# Patient Record
Sex: Female | Born: 1950
Health system: Southern US, Community
[De-identification: ages and names within clinical notes are randomized; demographics above are authoritative.]

## PROBLEM LIST (undated history)

## (undated) DIAGNOSIS — I10 Essential (primary) hypertension: Secondary | ICD-10-CM

## (undated) DIAGNOSIS — I480 Paroxysmal atrial fibrillation: Secondary | ICD-10-CM

## (undated) DIAGNOSIS — F39 Unspecified mood [affective] disorder: Secondary | ICD-10-CM

## (undated) DIAGNOSIS — Z9151 Personal history of suicidal behavior: Secondary | ICD-10-CM

## (undated) DIAGNOSIS — Z8543 Personal history of malignant neoplasm of ovary: Secondary | ICD-10-CM

## (undated) DIAGNOSIS — J302 Other seasonal allergic rhinitis: Secondary | ICD-10-CM

## (undated) DIAGNOSIS — K589 Irritable bowel syndrome without diarrhea: Secondary | ICD-10-CM

## (undated) DIAGNOSIS — I6522 Occlusion and stenosis of left carotid artery: Secondary | ICD-10-CM

## (undated) DIAGNOSIS — Z86010 Personal history of colonic polyps: Secondary | ICD-10-CM

## (undated) DIAGNOSIS — Z87442 Personal history of urinary calculi: Secondary | ICD-10-CM

## (undated) DIAGNOSIS — N993 Prolapse of vaginal vault after hysterectomy: Secondary | ICD-10-CM

## (undated) DIAGNOSIS — E785 Hyperlipidemia, unspecified: Secondary | ICD-10-CM

## (undated) DIAGNOSIS — M51379 Other intervertebral disc degeneration, lumbosacral region without mention of lumbar back pain or lower extremity pain: Secondary | ICD-10-CM

## (undated) DIAGNOSIS — Z8619 Personal history of other infectious and parasitic diseases: Secondary | ICD-10-CM

## (undated) DIAGNOSIS — C210 Malignant neoplasm of anus, unspecified: Secondary | ICD-10-CM

## (undated) DIAGNOSIS — D649 Anemia, unspecified: Secondary | ICD-10-CM

## (undated) DIAGNOSIS — I779 Disorder of arteries and arterioles, unspecified: Secondary | ICD-10-CM

## (undated) DIAGNOSIS — C569 Malignant neoplasm of unspecified ovary: Secondary | ICD-10-CM

## (undated) DIAGNOSIS — T451X5A Adverse effect of antineoplastic and immunosuppressive drugs, initial encounter: Secondary | ICD-10-CM

## (undated) DIAGNOSIS — N189 Chronic kidney disease, unspecified: Secondary | ICD-10-CM

## (undated) DIAGNOSIS — Z5189 Encounter for other specified aftercare: Secondary | ICD-10-CM

## (undated) DIAGNOSIS — R51 Headache: Secondary | ICD-10-CM

## (undated) DIAGNOSIS — F419 Anxiety disorder, unspecified: Secondary | ICD-10-CM

## (undated) DIAGNOSIS — M5137 Other intervertebral disc degeneration, lumbosacral region: Secondary | ICD-10-CM

## (undated) DIAGNOSIS — R112 Nausea with vomiting, unspecified: Secondary | ICD-10-CM

## (undated) DIAGNOSIS — F329 Major depressive disorder, single episode, unspecified: Secondary | ICD-10-CM

## (undated) DIAGNOSIS — Z8709 Personal history of other diseases of the respiratory system: Secondary | ICD-10-CM

## (undated) DIAGNOSIS — T7840XA Allergy, unspecified, initial encounter: Secondary | ICD-10-CM

## (undated) DIAGNOSIS — J410 Simple chronic bronchitis: Secondary | ICD-10-CM

## (undated) DIAGNOSIS — K219 Gastro-esophageal reflux disease without esophagitis: Secondary | ICD-10-CM

## (undated) DIAGNOSIS — N823 Fistula of vagina to large intestine: Secondary | ICD-10-CM

## (undated) DIAGNOSIS — Z915 Personal history of self-harm: Secondary | ICD-10-CM

## (undated) DIAGNOSIS — K5730 Diverticulosis of large intestine without perforation or abscess without bleeding: Secondary | ICD-10-CM

## (undated) DIAGNOSIS — M1990 Unspecified osteoarthritis, unspecified site: Secondary | ICD-10-CM

## (undated) HISTORY — DX: Personal history of urinary calculi: Z87.442

## (undated) HISTORY — DX: Essential (primary) hypertension: I10

## (undated) HISTORY — DX: Paroxysmal atrial fibrillation: I48.0

## (undated) HISTORY — DX: Irritable bowel syndrome, unspecified: K58.9

## (undated) HISTORY — DX: Chronic kidney disease, unspecified: N18.9

## (undated) HISTORY — PX: COSMETIC SURGERY: SHX468

## (undated) HISTORY — DX: Occlusion and stenosis of left carotid artery: I65.22

## (undated) HISTORY — DX: Nausea with vomiting, unspecified: R11.2

## (undated) HISTORY — DX: Prolapse of vaginal vault after hysterectomy: N99.3

## (undated) HISTORY — DX: Malignant neoplasm of unspecified ovary: C56.9

## (undated) HISTORY — PX: BREAST SURGERY: SHX581

## (undated) HISTORY — DX: Fistula of vagina to large intestine: N82.3

## (undated) HISTORY — DX: Anxiety disorder, unspecified: F41.9

## (undated) HISTORY — DX: Anemia, unspecified: D64.9

## (undated) HISTORY — DX: Adverse effect of antineoplastic and immunosuppressive drugs, initial encounter: T45.1X5A

## (undated) HISTORY — DX: Hyperlipidemia, unspecified: E78.5

## (undated) HISTORY — DX: Allergy, unspecified, initial encounter: T78.40XA

## (undated) HISTORY — DX: Encounter for other specified aftercare: Z51.89

## (undated) HISTORY — PX: TUBAL LIGATION: SHX77

## (undated) HISTORY — DX: Personal history of malignant neoplasm of ovary: Z85.43

## (undated) HISTORY — PX: OTHER SURGICAL HISTORY: SHX169

---

## 1979-09-26 HISTORY — PX: VAGINAL HYSTERECTOMY: SUR661

## 1984-09-25 HISTORY — PX: BREAST ENHANCEMENT SURGERY: SHX7

## 1998-10-07 ENCOUNTER — Encounter: Admission: RE | Admit: 1998-10-07 | Discharge: 1998-10-07 | Payer: Self-pay | Admitting: Family Medicine

## 1999-08-10 ENCOUNTER — Other Ambulatory Visit: Admission: RE | Admit: 1999-08-10 | Discharge: 1999-08-10 | Payer: Self-pay | Admitting: Obstetrics and Gynecology

## 1999-09-02 ENCOUNTER — Encounter: Admission: RE | Admit: 1999-09-02 | Discharge: 1999-09-02 | Payer: Self-pay | Admitting: Obstetrics and Gynecology

## 1999-09-02 ENCOUNTER — Encounter: Payer: Self-pay | Admitting: Obstetrics and Gynecology

## 2000-11-16 ENCOUNTER — Other Ambulatory Visit: Admission: RE | Admit: 2000-11-16 | Discharge: 2000-11-16 | Payer: Self-pay | Admitting: Obstetrics and Gynecology

## 2001-10-30 ENCOUNTER — Other Ambulatory Visit: Admission: RE | Admit: 2001-10-30 | Discharge: 2001-10-30 | Payer: Self-pay | Admitting: Obstetrics and Gynecology

## 2002-03-07 ENCOUNTER — Ambulatory Visit (HOSPITAL_COMMUNITY): Admission: RE | Admit: 2002-03-07 | Discharge: 2002-03-07 | Payer: Self-pay | Admitting: Family Medicine

## 2002-03-07 ENCOUNTER — Encounter: Payer: Self-pay | Admitting: Family Medicine

## 2002-03-24 ENCOUNTER — Ambulatory Visit (HOSPITAL_COMMUNITY): Admission: RE | Admit: 2002-03-24 | Discharge: 2002-03-24 | Payer: Self-pay | Admitting: Family Medicine

## 2002-03-24 ENCOUNTER — Encounter: Payer: Self-pay | Admitting: Family Medicine

## 2002-04-06 ENCOUNTER — Emergency Department (HOSPITAL_COMMUNITY): Admission: EM | Admit: 2002-04-06 | Discharge: 2002-04-06 | Payer: Self-pay | Admitting: *Deleted

## 2002-06-25 ENCOUNTER — Encounter: Payer: Self-pay | Admitting: Obstetrics and Gynecology

## 2002-06-25 ENCOUNTER — Ambulatory Visit (HOSPITAL_COMMUNITY): Admission: RE | Admit: 2002-06-25 | Discharge: 2002-06-25 | Payer: Self-pay | Admitting: Obstetrics and Gynecology

## 2003-08-03 ENCOUNTER — Ambulatory Visit (HOSPITAL_COMMUNITY): Admission: RE | Admit: 2003-08-03 | Discharge: 2003-08-03 | Payer: Self-pay | Admitting: Internal Medicine

## 2004-07-01 ENCOUNTER — Ambulatory Visit (HOSPITAL_COMMUNITY): Admission: RE | Admit: 2004-07-01 | Discharge: 2004-07-01 | Payer: Self-pay | Admitting: Family Medicine

## 2004-07-27 ENCOUNTER — Ambulatory Visit (HOSPITAL_COMMUNITY): Admission: RE | Admit: 2004-07-27 | Discharge: 2004-07-27 | Payer: Self-pay | Admitting: Obstetrics and Gynecology

## 2004-08-03 ENCOUNTER — Ambulatory Visit (HOSPITAL_COMMUNITY): Admission: RE | Admit: 2004-08-03 | Discharge: 2004-08-03 | Payer: Self-pay | Admitting: Obstetrics and Gynecology

## 2004-09-01 ENCOUNTER — Ambulatory Visit (HOSPITAL_COMMUNITY): Admission: RE | Admit: 2004-09-01 | Discharge: 2004-09-01 | Payer: Self-pay | Admitting: Orthopedic Surgery

## 2004-09-22 ENCOUNTER — Ambulatory Visit (HOSPITAL_COMMUNITY): Admission: RE | Admit: 2004-09-22 | Discharge: 2004-09-22 | Payer: Self-pay | Admitting: Orthopedic Surgery

## 2004-09-22 HISTORY — PX: KNEE ARTHROSCOPY: SUR90

## 2004-11-29 ENCOUNTER — Emergency Department (HOSPITAL_COMMUNITY): Admission: EM | Admit: 2004-11-29 | Discharge: 2004-11-29 | Payer: Self-pay | Admitting: Emergency Medicine

## 2005-02-01 ENCOUNTER — Ambulatory Visit (HOSPITAL_COMMUNITY): Admission: RE | Admit: 2005-02-01 | Discharge: 2005-02-01 | Payer: Self-pay | Admitting: Obstetrics and Gynecology

## 2005-10-18 ENCOUNTER — Ambulatory Visit (HOSPITAL_COMMUNITY): Admission: RE | Admit: 2005-10-18 | Discharge: 2005-10-18 | Payer: Self-pay | Admitting: Obstetrics and Gynecology

## 2006-04-10 ENCOUNTER — Ambulatory Visit (HOSPITAL_COMMUNITY): Payer: Self-pay | Admitting: Psychiatry

## 2006-04-12 ENCOUNTER — Ambulatory Visit (HOSPITAL_COMMUNITY): Admission: RE | Admit: 2006-04-12 | Discharge: 2006-04-12 | Payer: Self-pay | Admitting: Family Medicine

## 2006-04-24 ENCOUNTER — Ambulatory Visit (HOSPITAL_COMMUNITY): Payer: Self-pay | Admitting: Psychology

## 2006-05-03 ENCOUNTER — Ambulatory Visit (HOSPITAL_COMMUNITY): Payer: Self-pay | Admitting: Psychology

## 2006-05-08 ENCOUNTER — Ambulatory Visit (HOSPITAL_COMMUNITY): Payer: Self-pay | Admitting: Psychiatry

## 2006-05-22 ENCOUNTER — Ambulatory Visit (HOSPITAL_COMMUNITY): Payer: Self-pay | Admitting: Psychology

## 2006-06-05 ENCOUNTER — Ambulatory Visit (HOSPITAL_COMMUNITY): Payer: Self-pay | Admitting: Psychology

## 2006-06-19 ENCOUNTER — Ambulatory Visit (HOSPITAL_COMMUNITY): Payer: Self-pay | Admitting: Psychology

## 2006-06-28 ENCOUNTER — Ambulatory Visit (HOSPITAL_COMMUNITY): Payer: Self-pay | Admitting: Psychology

## 2006-07-03 ENCOUNTER — Ambulatory Visit (HOSPITAL_COMMUNITY): Payer: Self-pay | Admitting: Psychiatry

## 2006-07-12 ENCOUNTER — Ambulatory Visit (HOSPITAL_COMMUNITY): Payer: Self-pay | Admitting: Psychology

## 2006-07-31 ENCOUNTER — Ambulatory Visit (HOSPITAL_COMMUNITY): Payer: Self-pay | Admitting: Psychiatry

## 2006-08-07 ENCOUNTER — Ambulatory Visit (HOSPITAL_COMMUNITY): Payer: Self-pay | Admitting: Psychology

## 2006-08-28 ENCOUNTER — Ambulatory Visit (HOSPITAL_COMMUNITY): Payer: Self-pay | Admitting: Psychiatry

## 2006-08-30 ENCOUNTER — Ambulatory Visit (HOSPITAL_COMMUNITY): Payer: Self-pay | Admitting: Psychology

## 2006-11-01 ENCOUNTER — Ambulatory Visit (HOSPITAL_COMMUNITY): Payer: Self-pay | Admitting: Psychiatry

## 2006-11-07 ENCOUNTER — Ambulatory Visit: Payer: Self-pay | Admitting: Gastroenterology

## 2006-11-29 ENCOUNTER — Ambulatory Visit (HOSPITAL_COMMUNITY): Payer: Self-pay | Admitting: Psychiatry

## 2006-12-11 ENCOUNTER — Ambulatory Visit (HOSPITAL_COMMUNITY): Admission: RE | Admit: 2006-12-11 | Discharge: 2006-12-11 | Payer: Self-pay | Admitting: Internal Medicine

## 2006-12-11 ENCOUNTER — Ambulatory Visit: Payer: Self-pay | Admitting: Internal Medicine

## 2006-12-25 ENCOUNTER — Ambulatory Visit (HOSPITAL_COMMUNITY): Admission: RE | Admit: 2006-12-25 | Discharge: 2006-12-25 | Payer: Self-pay | Admitting: Obstetrics and Gynecology

## 2007-01-29 ENCOUNTER — Ambulatory Visit (HOSPITAL_COMMUNITY): Payer: Self-pay | Admitting: Psychiatry

## 2007-05-02 ENCOUNTER — Ambulatory Visit (HOSPITAL_COMMUNITY): Payer: Self-pay | Admitting: Psychiatry

## 2007-05-21 ENCOUNTER — Ambulatory Visit (HOSPITAL_COMMUNITY): Payer: Self-pay | Admitting: Psychiatry

## 2007-07-18 ENCOUNTER — Ambulatory Visit (HOSPITAL_COMMUNITY): Payer: Self-pay | Admitting: Psychiatry

## 2007-10-01 ENCOUNTER — Ambulatory Visit (HOSPITAL_COMMUNITY): Payer: Self-pay | Admitting: Psychiatry

## 2007-12-26 ENCOUNTER — Ambulatory Visit (HOSPITAL_COMMUNITY): Payer: Self-pay | Admitting: Psychiatry

## 2008-01-22 ENCOUNTER — Ambulatory Visit (HOSPITAL_COMMUNITY): Admission: RE | Admit: 2008-01-22 | Discharge: 2008-01-22 | Payer: Self-pay | Admitting: Obstetrics and Gynecology

## 2008-03-26 ENCOUNTER — Ambulatory Visit (HOSPITAL_COMMUNITY): Payer: Self-pay | Admitting: Psychiatry

## 2008-06-25 ENCOUNTER — Ambulatory Visit (HOSPITAL_COMMUNITY): Payer: Self-pay | Admitting: Psychiatry

## 2008-09-25 DIAGNOSIS — Z9151 Personal history of suicidal behavior: Secondary | ICD-10-CM

## 2008-09-25 HISTORY — DX: Personal history of suicidal behavior: Z91.51

## 2008-09-29 ENCOUNTER — Ambulatory Visit (HOSPITAL_COMMUNITY): Payer: Self-pay | Admitting: Psychiatry

## 2008-12-24 ENCOUNTER — Ambulatory Visit (HOSPITAL_COMMUNITY): Payer: Self-pay | Admitting: Psychiatry

## 2009-03-23 ENCOUNTER — Ambulatory Visit (HOSPITAL_COMMUNITY): Payer: Self-pay | Admitting: Psychiatry

## 2009-04-14 ENCOUNTER — Ambulatory Visit (HOSPITAL_COMMUNITY): Payer: Self-pay | Admitting: Psychiatry

## 2009-04-28 ENCOUNTER — Ambulatory Visit (HOSPITAL_COMMUNITY): Payer: Self-pay | Admitting: Psychiatry

## 2009-05-12 ENCOUNTER — Ambulatory Visit (HOSPITAL_COMMUNITY): Payer: Self-pay | Admitting: Psychiatry

## 2009-05-30 ENCOUNTER — Inpatient Hospital Stay (HOSPITAL_COMMUNITY): Admission: EM | Admit: 2009-05-30 | Discharge: 2009-06-07 | Payer: Self-pay | Admitting: Emergency Medicine

## 2009-06-07 ENCOUNTER — Ambulatory Visit: Payer: Self-pay | Admitting: Psychiatry

## 2009-06-08 ENCOUNTER — Inpatient Hospital Stay (HOSPITAL_COMMUNITY): Admission: RE | Admit: 2009-06-08 | Discharge: 2009-06-10 | Payer: Self-pay | Admitting: Psychiatry

## 2009-06-14 ENCOUNTER — Ambulatory Visit (HOSPITAL_COMMUNITY): Payer: Self-pay | Admitting: Psychiatry

## 2009-06-29 ENCOUNTER — Ambulatory Visit (HOSPITAL_COMMUNITY): Payer: Self-pay | Admitting: Psychiatry

## 2009-07-06 ENCOUNTER — Ambulatory Visit (HOSPITAL_COMMUNITY): Payer: Self-pay | Admitting: Psychiatry

## 2009-07-09 ENCOUNTER — Ambulatory Visit (HOSPITAL_COMMUNITY): Admission: RE | Admit: 2009-07-09 | Discharge: 2009-07-09 | Payer: Self-pay | Admitting: Family Medicine

## 2009-07-12 ENCOUNTER — Ambulatory Visit (HOSPITAL_COMMUNITY): Payer: Self-pay | Admitting: Psychiatry

## 2009-07-26 ENCOUNTER — Ambulatory Visit (HOSPITAL_COMMUNITY): Payer: Self-pay | Admitting: Psychiatry

## 2009-08-03 ENCOUNTER — Ambulatory Visit (HOSPITAL_COMMUNITY): Payer: Self-pay | Admitting: Psychiatry

## 2009-08-10 ENCOUNTER — Ambulatory Visit (HOSPITAL_COMMUNITY): Payer: Self-pay | Admitting: Psychiatry

## 2009-09-14 ENCOUNTER — Ambulatory Visit (HOSPITAL_COMMUNITY): Payer: Self-pay | Admitting: Psychiatry

## 2009-11-11 ENCOUNTER — Ambulatory Visit (HOSPITAL_COMMUNITY): Payer: Self-pay | Admitting: Psychiatry

## 2010-02-08 ENCOUNTER — Ambulatory Visit (HOSPITAL_COMMUNITY): Payer: Self-pay | Admitting: Psychiatry

## 2010-05-17 ENCOUNTER — Ambulatory Visit (HOSPITAL_COMMUNITY): Payer: Self-pay | Admitting: Psychiatry

## 2010-05-26 ENCOUNTER — Ambulatory Visit (HOSPITAL_COMMUNITY): Payer: Self-pay | Admitting: Psychiatry

## 2010-06-02 ENCOUNTER — Ambulatory Visit (HOSPITAL_COMMUNITY): Payer: Self-pay | Admitting: Psychiatry

## 2010-06-09 ENCOUNTER — Ambulatory Visit (HOSPITAL_COMMUNITY): Payer: Self-pay | Admitting: Psychiatry

## 2010-06-23 ENCOUNTER — Ambulatory Visit (HOSPITAL_COMMUNITY): Payer: Self-pay | Admitting: Psychiatry

## 2010-07-07 ENCOUNTER — Ambulatory Visit (HOSPITAL_COMMUNITY): Payer: Self-pay | Admitting: Psychiatry

## 2010-07-14 ENCOUNTER — Ambulatory Visit (HOSPITAL_COMMUNITY): Payer: Self-pay | Admitting: Psychiatry

## 2010-07-21 ENCOUNTER — Ambulatory Visit (HOSPITAL_COMMUNITY): Payer: Self-pay | Admitting: Psychiatry

## 2010-08-04 ENCOUNTER — Ambulatory Visit (HOSPITAL_COMMUNITY): Payer: Self-pay | Admitting: Psychiatry

## 2010-08-16 ENCOUNTER — Ambulatory Visit (HOSPITAL_COMMUNITY): Admission: RE | Admit: 2010-08-16 | Discharge: 2010-08-16 | Payer: Self-pay | Admitting: Obstetrics and Gynecology

## 2010-08-25 ENCOUNTER — Ambulatory Visit (HOSPITAL_COMMUNITY): Payer: Self-pay | Admitting: Psychiatry

## 2010-09-09 ENCOUNTER — Ambulatory Visit (HOSPITAL_COMMUNITY): Payer: Self-pay | Admitting: Psychiatry

## 2010-10-13 ENCOUNTER — Ambulatory Visit (HOSPITAL_COMMUNITY): Admit: 2010-10-13 | Payer: Self-pay | Admitting: Psychiatry

## 2010-10-15 ENCOUNTER — Encounter: Payer: Self-pay | Admitting: Obstetrics and Gynecology

## 2010-10-16 ENCOUNTER — Encounter: Payer: Self-pay | Admitting: Obstetrics and Gynecology

## 2010-10-17 ENCOUNTER — Encounter: Payer: Self-pay | Admitting: Obstetrics & Gynecology

## 2010-10-20 ENCOUNTER — Ambulatory Visit (HOSPITAL_COMMUNITY)
Admission: RE | Admit: 2010-10-20 | Discharge: 2010-10-20 | Payer: Self-pay | Source: Home / Self Care | Attending: Psychiatry | Admitting: Psychiatry

## 2010-10-25 ENCOUNTER — Ambulatory Visit (HOSPITAL_COMMUNITY)
Admission: RE | Admit: 2010-10-25 | Discharge: 2010-10-25 | Payer: Self-pay | Source: Home / Self Care | Attending: Psychiatry | Admitting: Psychiatry

## 2010-11-03 ENCOUNTER — Encounter (HOSPITAL_COMMUNITY): Payer: Self-pay | Admitting: Psychiatry

## 2010-11-18 ENCOUNTER — Encounter (INDEPENDENT_AMBULATORY_CARE_PROVIDER_SITE_OTHER): Payer: 59 | Admitting: Psychiatry

## 2010-11-18 DIAGNOSIS — F39 Unspecified mood [affective] disorder: Secondary | ICD-10-CM

## 2010-12-01 ENCOUNTER — Encounter (HOSPITAL_COMMUNITY): Payer: 59 | Admitting: Psychiatry

## 2010-12-30 LAB — D-DIMER, QUANTITATIVE: D-Dimer, Quant: 0.43 ug/mL-FEU (ref 0.00–0.48)

## 2010-12-30 LAB — CBC
HCT: 31.2 % — ABNORMAL LOW (ref 36.0–46.0)
HCT: 32.7 % — ABNORMAL LOW (ref 36.0–46.0)
HCT: 33 % — ABNORMAL LOW (ref 36.0–46.0)
HCT: 34.1 % — ABNORMAL LOW (ref 36.0–46.0)
Hemoglobin: 10.8 g/dL — ABNORMAL LOW (ref 12.0–15.0)
Hemoglobin: 11.4 g/dL — ABNORMAL LOW (ref 12.0–15.0)
Hemoglobin: 12 g/dL (ref 12.0–15.0)
MCHC: 34.8 g/dL (ref 30.0–36.0)
MCHC: 35.7 g/dL (ref 30.0–36.0)
MCV: 97.8 fL (ref 78.0–100.0)
MCV: 97.9 fL (ref 78.0–100.0)
MCV: 98.2 fL (ref 78.0–100.0)
Platelets: 164 10*3/uL (ref 150–400)
Platelets: 178 10*3/uL (ref 150–400)
Platelets: 184 10*3/uL (ref 150–400)
Platelets: 194 10*3/uL (ref 150–400)
Platelets: 211 10*3/uL (ref 150–400)
Platelets: 512 10*3/uL — ABNORMAL HIGH (ref 150–400)
RBC: 3.35 MIL/uL — ABNORMAL LOW (ref 3.87–5.11)
RBC: 3.79 MIL/uL — ABNORMAL LOW (ref 3.87–5.11)
RDW: 13.4 % (ref 11.5–15.5)
RDW: 13.5 % (ref 11.5–15.5)
RDW: 13.6 % (ref 11.5–15.5)
RDW: 14.1 % (ref 11.5–15.5)
WBC: 11 10*3/uL — ABNORMAL HIGH (ref 4.0–10.5)
WBC: 5.7 10*3/uL (ref 4.0–10.5)
WBC: 8.4 10*3/uL (ref 4.0–10.5)

## 2010-12-30 LAB — BASIC METABOLIC PANEL
BUN: 17 mg/dL (ref 6–23)
BUN: 3 mg/dL — ABNORMAL LOW (ref 6–23)
BUN: 3 mg/dL — ABNORMAL LOW (ref 6–23)
BUN: 5 mg/dL — ABNORMAL LOW (ref 6–23)
BUN: 6 mg/dL (ref 6–23)
BUN: 7 mg/dL (ref 6–23)
BUN: 3 mg/dL — ABNORMAL LOW (ref 6–23)
CO2: 21 mEq/L (ref 19–32)
CO2: 26 mEq/L (ref 19–32)
CO2: 26 mEq/L (ref 19–32)
Calcium: 8.2 mg/dL — ABNORMAL LOW (ref 8.4–10.5)
Calcium: 8.3 mg/dL — ABNORMAL LOW (ref 8.4–10.5)
Calcium: 8.5 mg/dL (ref 8.4–10.5)
Calcium: 8.9 mg/dL (ref 8.4–10.5)
Chloride: 106 mEq/L (ref 96–112)
Chloride: 105 mEq/L (ref 96–112)
Creatinine, Ser: 0.56 mg/dL (ref 0.4–1.2)
Creatinine, Ser: 0.89 mg/dL (ref 0.4–1.2)
Creatinine, Ser: 0.61 mg/dL (ref 0.4–1.2)
GFR calc Af Amer: >60 mL/min (ref >60)
GFR calc Af Amer: >60 mL/min (ref >60)
GFR calc Af Amer: >60 mL/min (ref >60)
GFR calc non Af Amer: >60 mL/min (ref >60)
GFR calc non Af Amer: >60 mL/min (ref >60)
GFR calc non Af Amer: >60 mL/min (ref >60)
GFR calc non Af Amer: >60 mL/min (ref >60)
Glucose, Bld: 115 mg/dL — ABNORMAL HIGH (ref 70–99)
Glucose, Bld: 119 mg/dL — ABNORMAL HIGH (ref 70–99)
Glucose, Bld: 137 mg/dL — ABNORMAL HIGH (ref 70–99)
Potassium: 3.4 mEq/L — ABNORMAL LOW (ref 3.5–5.1)
Potassium: 3.7 mEq/L (ref 3.5–5.1)
Sodium: 140 mEq/L (ref 135–145)
Sodium: 141 mEq/L (ref 135–145)

## 2010-12-30 LAB — DIFFERENTIAL
Basophils Absolute: 0 10*3/uL (ref 0.0–0.1)
Basophils Absolute: 0 10*3/uL (ref 0.0–0.1)
Basophils Absolute: 0 10*3/uL (ref 0.0–0.1)
Basophils Relative: 0 % (ref 0–1)
Eosinophils Absolute: 0 10*3/uL (ref 0.0–0.7)
Eosinophils Absolute: 0.1 10*3/uL (ref 0.0–0.7)
Eosinophils Absolute: 0.1 10*3/uL (ref 0.0–0.7)
Eosinophils Relative: 1 % (ref 0–5)
Eosinophils Relative: 1 % (ref 0–5)
Eosinophils Relative: 1 % (ref 0–5)
Eosinophils Relative: 2 % (ref 0–5)
Eosinophils Relative: 2 % (ref 0–5)
Eosinophils Relative: 6 % — ABNORMAL HIGH (ref 0–5)
Lymphocytes Relative: 11 % — ABNORMAL LOW (ref 12–46)
Lymphocytes Relative: 15 % (ref 12–46)
Lymphocytes Relative: 16 % (ref 12–46)
Lymphocytes Relative: 7 % — ABNORMAL LOW (ref 12–46)
Lymphs Abs: 0.6 10*3/uL — ABNORMAL LOW (ref 0.7–4.0)
Lymphs Abs: 0.8 10*3/uL (ref 0.7–4.0)
Lymphs Abs: 0.9 10*3/uL (ref 0.7–4.0)
Lymphs Abs: 1.3 10*3/uL (ref 0.7–4.0)
Lymphs Abs: 1.9 10*3/uL (ref 0.7–4.0)
Monocytes Absolute: 0.1 10*3/uL (ref 0.1–1.0)
Monocytes Absolute: 0.4 10*3/uL (ref 0.1–1.0)
Monocytes Absolute: 0.4 10*3/uL (ref 0.1–1.0)
Monocytes Relative: 3 % (ref 3–12)
Monocytes Relative: 5 % (ref 3–12)
Neutro Abs: 6.2 10*3/uL (ref 1.7–7.7)
Neutro Abs: 9.9 10*3/uL — ABNORMAL HIGH (ref 1.7–7.7)
Neutrophils Relative %: 91 % — ABNORMAL HIGH (ref 43–77)

## 2010-12-30 LAB — BLOOD GAS, ARTERIAL
Acid-Base Excess: 0.1 mmol/L (ref 0.0–2.0)
Acid-base deficit: 5.2 mmol/L — ABNORMAL HIGH (ref 0.0–2.0)
Bicarbonate: 23.7 mEq/L (ref 20.0–24.0)
Bicarbonate: 24.1 mEq/L — ABNORMAL HIGH (ref 20.0–24.0)
FIO2: 0.4 %
MECHVT: 550 mL
MECHVT: 550 mL
O2 Content: 2 L/min
O2 Saturation: 97.4 %
O2 Saturation: 99.1 %
PEEP: 5 cmH2O
PEEP: 5 cmH2O
Patient temperature: 37
RATE: 14 resp/min
RATE: 16 resp/min
TCO2: 21.8 mmol/L (ref 0–100)
pCO2 arterial: 34.5 mmHg — ABNORMAL LOW (ref 35.0–45.0)
pCO2 arterial: 38.4 mmHg (ref 35.0–45.0)
pCO2 arterial: 51.4 mmHg — ABNORMAL HIGH (ref 35.0–45.0)
pH, Arterial: 7.277 — ABNORMAL LOW (ref 7.350–7.400)
pO2, Arterial: 81.8 mmHg (ref 80.0–100.0)

## 2010-12-30 LAB — CARDIAC PANEL(CRET KIN+CKTOT+MB+TROPI)
CK, MB: 1.8 ng/mL (ref 0.3–4.0)
Total CK: 143 U/L (ref 7–177)
Troponin I: 0.01 ng/mL (ref 0.00–0.06)

## 2010-12-30 LAB — COMPREHENSIVE METABOLIC PANEL
ALT: 26 U/L (ref 0–35)
ALT: 33 U/L (ref 0–35)
AST: 28 U/L (ref 0–37)
AST: 36 U/L (ref 0–37)
Albumin: 2.9 g/dL — ABNORMAL LOW (ref 3.5–5.2)
CO2: 31 mEq/L (ref 19–32)
Calcium: 8.2 mg/dL — ABNORMAL LOW (ref 8.4–10.5)
Chloride: 103 mEq/L (ref 96–112)
Creatinine, Ser: 0.54 mg/dL (ref 0.4–1.2)
GFR calc Af Amer: 52 mL/min — ABNORMAL LOW (ref >60)
GFR calc Af Amer: >60 mL/min (ref >60)
GFR calc non Af Amer: 43 mL/min — ABNORMAL LOW (ref >60)
GFR calc non Af Amer: >60 mL/min (ref >60)
Potassium: 3.3 mEq/L — ABNORMAL LOW (ref 3.5–5.1)
Sodium: 139 mEq/L (ref 135–145)
Sodium: 142 mEq/L (ref 135–145)
Total Bilirubin: 0.3 mg/dL (ref 0.3–1.2)
Total Protein: 5.3 g/dL — ABNORMAL LOW (ref 6.0–8.3)

## 2010-12-30 LAB — RAPID URINE DRUG SCREEN, HOSP PERFORMED
Barbiturates: NOT DETECTED
Cocaine: NOT DETECTED
Opiates: NOT DETECTED

## 2010-12-30 LAB — ACETAMINOPHEN LEVEL: Acetaminophen (Tylenol), Serum: <10 ug/mL — ABNORMAL LOW (ref 10–30)

## 2010-12-30 LAB — URINALYSIS, ROUTINE W REFLEX MICROSCOPIC
Glucose, UA: NEGATIVE mg/dL
pH: 5.5 (ref 5.0–8.0)

## 2010-12-30 LAB — APTT: aPTT: 25 seconds (ref 24–37)

## 2010-12-30 LAB — PROTIME-INR: INR: 1 (ref 0.00–1.49)

## 2010-12-30 LAB — CULTURE, BLOOD (ROUTINE X 2)

## 2010-12-30 LAB — TSH: TSH: 2.523 u[IU]/mL (ref 0.350–4.500)

## 2010-12-30 LAB — CULTURE, RESPIRATORY W GRAM STAIN

## 2010-12-30 LAB — URINE CULTURE: Culture: NO GROWTH

## 2010-12-30 LAB — SALICYLATE LEVEL: Salicylate Lvl: <4 mg/dL (ref 2.8–20.0)

## 2010-12-30 LAB — EXPECTORATED SPUTUM ASSESSMENT W GRAM STAIN, RFLX TO RESP C

## 2010-12-30 LAB — MAGNESIUM: Magnesium: 1.8 mg/dL (ref 1.5–2.5)

## 2011-01-19 ENCOUNTER — Encounter (INDEPENDENT_AMBULATORY_CARE_PROVIDER_SITE_OTHER): Payer: 59 | Admitting: Psychiatry

## 2011-01-19 DIAGNOSIS — F331 Major depressive disorder, recurrent, moderate: Secondary | ICD-10-CM

## 2011-02-10 NOTE — H&P (Signed)
NAME:  KENYATTA, GLOECKNER                  ACCOUNT NO.:  [id number]   MEDICAL RECORD NO.:  [id number]          PATIENT TYPE:  AMB   LOCATION:  DAY                           FACILITY:  [facility]   PHYSICIAN:  Kassie Mends, M.D.      DATE OF BIRTH:  Feb 19, 1951   DATE OF ADMISSION:  DATE OF DISCHARGE:  LH                              HISTORY & PHYSICAL   CHIEF COMPLAINT:  Recent diarrhea.  Need colonoscopy.   PRIMARY CARE PHYSICIAN:  Dr. Patrica Duel   PHYSICIAN COSIGNING NOTE:  Dr. Kassie Mends for Dr. Lionel December in his  absence.   HISTORY OF PRESENT ILLNESS:  Zarinah is a 60 year old Caucasian female who  presents in order to schedule a colonoscopy.  She was going to schedule  a screening colonoscopy several months ago but she actually had  postponed it.  At the time she was having some issues with constipation  and bleeding hemorrhoids.  Most recently, however, stools have been on  the loose side.  She had diarrhea for about two weeks. She felt she  might have eaten some contaminated food.  She denied any blood  associated with the diarrhea.  She has some irritation of her  hemorrhoids intermittently, uses Preparation H cream and wipes.  She  complains of frequent rectal itching.  Denies any recent hematochezia or  melena.  She has had some lower abdominal cramping which has improved  since her stools have become more normal.  She denies any nausea,  vomiting or chronic GERD.  Her weight has been stable.   CURRENT MEDICATIONS:  Paxil 12.5 mg 2 daily.  Lorazepam 1 mg t.i.d.  Estradiol 2 mg daily.   ALLERGIES:  CODEINE.   PAST MEDICAL HISTORY:  Depression, hormone replacement therapy.   PAST SURGICAL HISTORY:  She has had a partial hysterectomy, previous  tubal ligation and Cesarean section.  She had left knee arthroscopy and  breast augmentation.   FAMILY HISTORY:  Negative for colorectal cancer.  Her sister has had  diverticulitis and colorectal polyps.   SOCIAL HISTORY:  She is  married and has three children.  She is retired.  She smokes a pack of cigarettes daily.  Consumes a glass of wine every  day.   REVIEW OF SYSTEMS:  See HPI for GI and constitutional.  Cardiopulmonary:  Denies any chest pain or shortness of breath.   PHYSICAL EXAMINATION:  Weight 162.  Height 5 foot 6.  Temp 98.6.  Blood  pressure 120/82.  Pulse 88.  GENERAL:  Pleasant, well-nourished, well-developed, Caucasian female in  no acute distress.  SKIN:  Warm and dry, no jaundice.  HEENT:  Sclerae nonicteric.  Oropharyngeal mucosa moist and pink.  No  lesions, erythema or exudate.  No lymphadenopathy, thyromegaly.  CHEST:  Lungs are clear to auscultation.  CARDIAC EXAM:  Reveals regular rate and rhythm.  Normal S1, S2.  No  murmurs, rubs or gallops.  ABDOMEN:  Positive bowel sounds.  Abdomen is soft, nontender,  nondistended.  No organomegaly or masses.  No rebound tenderness or  guarding.  No  abdominal bruits or hernias.  EXTREMITIES: No edema.   IMPRESSION:  Senaida is a 60 year old lady who has had some hematochezia  in the past but none recently.  Recently she has had some diarrhea which  is basically resolving at this point.  She has problems with hemorrhoids  with rectal itching.  She has never had a colonoscopy.  Recommend one at  this point.  I discussed risks, alternatives and benefits with the  patient and she is agreeable to proceed.   PLAN:  Colonoscopy in the near future with Dr. Karilyn Cota.  Please note, Dr.  Cira Servant is co-signer in his absence.      Tana Coast, P.A.      Kassie Mends, M.D.  Electronically Signed    LL/MEDQ  D:  11/07/2006  T:  11/07/2006  Job:  [id number]   cc:   Patrica Duel, M.D.  Fax: [phone]

## 2011-02-10 NOTE — Op Note (Signed)
NAME:  Abigail Wagner, Abigail Wagner                  ACCOUNT NO.:  [id number]   MEDICAL RECORD NO.:  [id number]          PATIENT TYPE:  AMB   LOCATION:  DAY                          FACILITY:  [facility]   PHYSICIAN:  Marlowe Kays, M.D.  DATE OF BIRTH:  [birth year]   DATE OF PROCEDURE:  09/22/2004  DATE OF DISCHARGE:                                 OPERATIVE REPORT   PREOPERATIVE DIAGNOSES:  1.  Torn medial meniscus.  2.  Chondromalacia of medial facet of patella, left knee.   POSTOPERATIVE DIAGNOSES:  1.  Torn medial meniscus.  2.  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   OPERATION:  Left knee arthroscopy with:  1.  Partial medial meniscectomy.  2.  Shaving of medial femoral condyle and patella.   SURGEON:  Marlowe Kays, M.D.   ASSISTANT:  Nurse.   ANESTHESIA:  General.   PATHOLOGY AND JUSTIFICATION FOR PROCEDURE:  She has had a long history of  pain and something slipping in her left knee.  It became much worse the end  of November.  I saw her on  August 25, 2004, leading to an MRI showing a  badly torn medial meniscus.  With these findings, she is here today for the  above-mentioned surgery.  See below for additional details of pathology.   PROCEDURE:  Under satisfactory general anesthesia and pneumatic tourniquet,  leg was esmarched out non-sterilely, thigh stabilizer.  Left knee was  prepped with DuraPrep and draped into a sterile field, Ace wrap and knee  protector for the right knee.  Superior and medial saline inflow.  First  through an anterolateral portal, the medial compartment of the knee joint  was evaluated.  She had a little bit of synovitis anteriorly, which I  resected.  She had a very extensive tear with large flaps involving the  entire posterior third of the medial meniscus, which I resected back to a  stable rim with a combination of small arthroscopic scissors, straight and  angled upbiting baskets and a 3.5  shaver.  Final pictures were taken.  Looking out in the medial gutter and suprapatellar area, her patella was  quite worn, particularly at the medial facet.  There was some fissuring at  the apex; all this was smoothed down as much as possible.  We then reversed  portals.  Laterally, her knee looked normal; a representative picture was  taken.  I also looked up in the lateral gutter and patella once again and  shaved a little additional off the patella.  The knee joint was then  irrigated until clear and all fluid possible removed.  The 2 anterior  portals were closed with 4-0 nylon.  Twenty milliliters of 0.5% Marcaine  with adrenaline,but no morphine since she has a morphine allergy, were then  instilled through the inflow apparatus, which was removed, and this portal  closed with 4-0 nylon as well.  Betadine, Adaptic and dry sterile dressing  were applied.  Tourniquet was released.  She tolerated the procedure well  and  was taken to the recovery room in satisfactory condition with no known  complications.      JA/MEDQ  D:  09/22/2004  T:  09/22/2004  Job:  [id number]

## 2011-02-10 NOTE — Op Note (Signed)
Abigail Wagner, Abigail Wagner                  ACCOUNT NO.:  [id number]   MEDICAL RECORD NO.:  [id number]          PATIENT TYPE:  AMB   LOCATION:  DAY                           FACILITY:  [facility]   PHYSICIAN:  Lionel December, M.D.    DATE OF BIRTH:  January 19, 1951   DATE OF PROCEDURE:  12/10/2006  DATE OF DISCHARGE:                               OPERATIVE REPORT   PROCEDURE:  Colonoscopy.   INDICATIONS:  Abigail Wagner is a 60 year old Caucasian female with a recent bout  of diarrhea and hematochezia.  She states that her diarrhea has  resolved.  She is undergoing colonoscopy both for diagnostic and  screening purposes.  Family history is negative for colorectal  carcinoma.   The procedure risks were reviewed the patient, informed consent was  obtained.   MEDICATIONS FOR CONSCIOUS SEDATION:  Demerol 50 mg IV, Versed 10 mg IV  in divided dose.   FINDINGS:  Procedure performed in endoscopy suite.  The patient's vital  signs and O2 saturation were monitored during procedure and remained  stable.  The patient was placed in the left lateral position and rectal  examination performed.  No abnormality noted external or digital exam.  The Pentax video scope was placed in the rectum and advanced under  vision into sigmoid colon, where there was patchy edema and two small  erosions.  Preparation was satisfactory except she had some stool  coating mucosa at the ascending colon and cecum.  This area to be washed  to see the landmarks.  The scope was passed into cecum.  The colon was  somewhat redundant.  Cecum was identified by appendiceal orifice.  The  ileocecal valve was inconspicuous.  Pictures taken for the record.  As  the scope was withdrawn, colonic mucosa was once again carefully  examined and was otherwise normal.  Changes in the sigmoid colon were  once again recognized.  It was felt to be resolving colitis and  therefore a biopsy was not obtained.  Rectal mucosa was normal.  The  scope was retroflexed to  examine anorectal junction and small  hemorrhoids noted below the dentate line.  Endoscope was straightened  and withdrawn.  The patient tolerated the procedure well.   FINAL DIAGNOSES:  1. Focal edema at sigmoid colon with two erosions, suspect resolving      colitis, possibly nonspecific or infectious.  2  No evidence of a neoplasm or polyps.  1. External hemorrhoids.   RECOMMENDATIONS:  High-fiber diet plus fiber supplement 3-4 grams daily.   If need be, she can take Colace 1-2 tablets daily.   If symptoms relapse, she will give [place] a call.      Lionel December, M.D.  Electronically Signed     NR/MEDQ  D:  12/11/2006  T:  12/11/2006  Job:  [id number]   cc:   Patrica Duel, M.D.  Fax: [phone]

## 2011-04-13 ENCOUNTER — Encounter (HOSPITAL_COMMUNITY): Payer: 59 | Admitting: Psychiatry

## 2011-04-18 ENCOUNTER — Encounter (INDEPENDENT_AMBULATORY_CARE_PROVIDER_SITE_OTHER): Payer: 59 | Admitting: Psychiatry

## 2011-04-18 DIAGNOSIS — F331 Major depressive disorder, recurrent, moderate: Secondary | ICD-10-CM

## 2011-07-11 ENCOUNTER — Encounter (INDEPENDENT_AMBULATORY_CARE_PROVIDER_SITE_OTHER): Payer: 59 | Admitting: Psychiatry

## 2011-07-11 DIAGNOSIS — F331 Major depressive disorder, recurrent, moderate: Secondary | ICD-10-CM

## 2011-07-24 ENCOUNTER — Encounter (HOSPITAL_COMMUNITY): Payer: 59 | Admitting: Psychiatry

## 2011-08-01 ENCOUNTER — Ambulatory Visit (INDEPENDENT_AMBULATORY_CARE_PROVIDER_SITE_OTHER): Payer: 59 | Admitting: Psychiatry

## 2011-08-01 ENCOUNTER — Encounter (HOSPITAL_COMMUNITY): Payer: Self-pay | Admitting: Psychiatry

## 2011-08-01 DIAGNOSIS — F331 Major depressive disorder, recurrent, moderate: Secondary | ICD-10-CM

## 2011-08-01 MED ORDER — DIVALPROEX SODIUM 250 MG PO DR TAB: 250.0000 mg | DELAYED_RELEASE_TABLET | ORAL | Status: DC

## 2011-08-01 NOTE — Progress Notes (Signed)
Patient came today for her followup appointment we have started Depakote on her last visit she has been taking regularly. She's doing much better with his new medication her sleep has improved she is not sleeping 5-6 hours and problem. She still concerned about her son who is in hospital but made discharge today. She is concern about her grandson but hoping court case will be resolved soon. Recently her anxiety and depression is much better she does not have that much of racing thoughts agitation or anger issue. She reported was her affects of medication. Patient is pleasant cooperative and maintained good eye contact her speech is soft clear and coherent her thought process is logical and goal directed she denied any active or passive suicidal thoughts. There were no psychotic symptoms present at this time. There were no abnormal movements noted. Attention and concentration were improved from the past she's alert and oriented x3 her insight judgment and impulse control were okay. Assessment bipolar disorder rule out major depressive disorder. Plan I will continue Depakote 250 mg at bedtime she will also take Lexapro 20 mg daily a new prescription for Depakote has been given with additional refill. I will see her again in 2 months. Time spent 20 minutes.

## 2011-09-01 ENCOUNTER — Other Ambulatory Visit: Payer: Self-pay | Admitting: Obstetrics and Gynecology

## 2011-09-01 DIAGNOSIS — Z139 Encounter for screening, unspecified: Secondary | ICD-10-CM

## 2011-09-02 ENCOUNTER — Other Ambulatory Visit (HOSPITAL_COMMUNITY): Payer: Self-pay

## 2011-09-05 ENCOUNTER — Ambulatory Visit (INDEPENDENT_AMBULATORY_CARE_PROVIDER_SITE_OTHER): Payer: 59 | Admitting: Psychiatry

## 2011-09-05 ENCOUNTER — Ambulatory Visit (HOSPITAL_COMMUNITY): Payer: 59

## 2011-09-05 ENCOUNTER — Ambulatory Visit (HOSPITAL_COMMUNITY)
Admission: RE | Admit: 2011-09-05 | Discharge: 2011-09-05 | Disposition: A | Discharge status: Home / Self Care | Payer: 59 | Source: Ambulatory Visit | Attending: Obstetrics and Gynecology | Admitting: Obstetrics and Gynecology

## 2011-09-05 ENCOUNTER — Encounter (HOSPITAL_COMMUNITY): Payer: Self-pay | Admitting: Psychiatry

## 2011-09-05 VITALS — Wt 169.4 lb

## 2011-09-05 DIAGNOSIS — F39 Unspecified mood [affective] disorder: Secondary | ICD-10-CM

## 2011-09-05 DIAGNOSIS — Z1231 Encounter for screening mammogram for malignant neoplasm of breast: Secondary | ICD-10-CM | POA: Insufficient documentation

## 2011-09-05 DIAGNOSIS — F331 Major depressive disorder, recurrent, moderate: Secondary | ICD-10-CM

## 2011-09-05 DIAGNOSIS — Z139 Encounter for screening, unspecified: Secondary | ICD-10-CM

## 2011-09-05 DIAGNOSIS — F329 Major depressive disorder, single episode, unspecified: Secondary | ICD-10-CM

## 2011-09-05 MED ORDER — DIVALPROEX SODIUM 250 MG PO DR TAB: 250.0000 mg | DELAYED_RELEASE_TABLET | ORAL | Status: DC

## 2011-09-05 MED ORDER — ESCITALOPRAM OXALATE 20 MG PO TABS: 20.0000 mg | ORAL_TABLET | Freq: Every day | ORAL | Status: DC

## 2011-09-05 NOTE — Progress Notes (Signed)
Patient came today for her followup appointment she is now taking Depakote regularly. She's doing much better  her sleep has improved. Court case about her grand son is now resolved. Her anxiety and depression is much better. she does not have that much of racing thoughts agitation or anger issue. She was to continue her current medication. She had a good Thanksgiving and she is looking forward to have a good Christmas. She is not drinking anymore.  Mental status examination Patient is pleasant cooperative and maintained good eye contact her speech is soft clear and coherent her thought process is logical and goal directed she denied any active or passive suicidal thoughts. There were no psychotic symptoms present at this time. There were no abnormal movements noted. Attention and concentration were improved from the past she's alert and oriented x3 her insight judgment and impulse control were okay.  Assessment bipolar disorder rule out major depressive disorder.  Plan I will continue Depakote 250 mg at bedtime and Lexapro 20 mg daily. I explained risks and benefits of medication in detail. I recommended to call if she has any question or concern about the medication. She is not interested for individual psychotherapy. A new prescription of Depakote and Lexapro has been given. I will see her again in 3 months.

## 2011-10-30 ENCOUNTER — Other Ambulatory Visit (HOSPITAL_COMMUNITY): Payer: Self-pay | Admitting: *Deleted

## 2011-10-30 DIAGNOSIS — F329 Major depressive disorder, single episode, unspecified: Secondary | ICD-10-CM

## 2011-10-31 ENCOUNTER — Other Ambulatory Visit (HOSPITAL_COMMUNITY): Payer: Self-pay | Admitting: Psychiatry

## 2011-10-31 MED ORDER — ESCITALOPRAM OXALATE 20 MG PO TABS: 20.0000 mg | ORAL_TABLET | Freq: Every day | ORAL | Status: DC

## 2011-11-07 ENCOUNTER — Ambulatory Visit (INDEPENDENT_AMBULATORY_CARE_PROVIDER_SITE_OTHER): Payer: 59 | Admitting: Psychiatry

## 2011-11-07 ENCOUNTER — Encounter (HOSPITAL_COMMUNITY): Payer: Self-pay | Admitting: Psychiatry

## 2011-11-07 DIAGNOSIS — F32A Depression, unspecified: Secondary | ICD-10-CM | POA: Insufficient documentation

## 2011-11-07 DIAGNOSIS — F3289 Other specified depressive episodes: Secondary | ICD-10-CM

## 2011-11-07 DIAGNOSIS — F329 Major depressive disorder, single episode, unspecified: Secondary | ICD-10-CM

## 2011-11-07 DIAGNOSIS — F39 Unspecified mood [affective] disorder: Secondary | ICD-10-CM

## 2011-11-07 DIAGNOSIS — F331 Major depressive disorder, recurrent, moderate: Secondary | ICD-10-CM

## 2011-11-07 MED ORDER — DIVALPROEX SODIUM 250 MG PO DR TAB: 250.0000 mg | DELAYED_RELEASE_TABLET | ORAL | Status: DC

## 2011-11-07 MED ORDER — ESCITALOPRAM OXALATE 20 MG PO TABS: 20.0000 mg | ORAL_TABLET | Freq: Every day | ORAL | Status: DC
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
## 2011-12-13 ENCOUNTER — Encounter (INDEPENDENT_AMBULATORY_CARE_PROVIDER_SITE_OTHER): Payer: Self-pay | Admitting: *Deleted

## 2011-12-27 ENCOUNTER — Ambulatory Visit (INDEPENDENT_AMBULATORY_CARE_PROVIDER_SITE_OTHER): Payer: 59 | Admitting: Internal Medicine

## 2011-12-27 ENCOUNTER — Encounter (INDEPENDENT_AMBULATORY_CARE_PROVIDER_SITE_OTHER): Payer: Self-pay | Admitting: Internal Medicine

## 2011-12-27 VITALS — BP 142/78 | HR 88 | Temp 97.9°F | Ht 65.0 in | Wt 165.0 lb

## 2011-12-27 DIAGNOSIS — F319 Bipolar disorder, unspecified: Secondary | ICD-10-CM | POA: Insufficient documentation

## 2011-12-27 DIAGNOSIS — K589 Irritable bowel syndrome without diarrhea: Secondary | ICD-10-CM | POA: Insufficient documentation

## 2011-12-27 DIAGNOSIS — B009 Herpesviral infection, unspecified: Secondary | ICD-10-CM | POA: Insufficient documentation

## 2011-12-27 DIAGNOSIS — N200 Calculus of kidney: Secondary | ICD-10-CM | POA: Insufficient documentation

## 2011-12-27 MED ORDER — DICYCLOMINE HCL 10 MG PO CAPS: 10.0000 mg | ORAL_CAPSULE | Freq: Three times a day (TID) | ORAL | Status: DC

## 2011-12-27 NOTE — Progress Notes (Signed)
Subjective:     Patient ID: Abigail Wagner, female   DOB: Jan 26, 1951, 61 y.o.   MRN: [id number]  HPI Referred by Dwyane Luo PA at [facility].  She tells me she has a lot of gas and diarrhea. She has sharp lower abdominal pain.  The Dicyclomine has helped her abdominal pain.  Dicyclomine started a couple of months ago.  Stools are sometimes hard now. She will have the diarrhea if she has been constipated.  If she skips a day without having a BM she will take Miralax which helps her. Appetite is good. No unintentional weight loss.  Stools are brown in color. No mucous in about a week.   No nausea or vomiting. Colonoscopy 12/10/2006 FINAL DIAGNOSES:  1. Focal edema at sigmoid colon with two erosions, suspect resolving  colitis, possibly nonspecific or infectious.  2 No evidence of a neoplasm or polyps.  1. External hemorrhoids.  Review of Systems    see hpi  Current Outpatient Prescriptions  Medication Sig Dispense Refill  . ascorbic acid (VITAMIN C) 500 MG tablet Take 500 mg by mouth daily.      . divalproex (DEPAKOTE) 250 MG DR tablet Take 1 tablet (250 mg total) by mouth 1 day or 1 dose.  90 tablet  0  . escitalopram (LEXAPRO) 20 MG tablet Take 1 tablet (20 mg total) by mouth daily.  90 tablet  0  . estradiol (ESTRACE) 2 MG tablet       . fluticasone (CUTIVATE) 0.05 % cream Apply topically 2 (two) times daily.      . folic acid (FOLVITE) 800 MCG tablet Take 400 mcg by mouth daily.      . lansoprazole (PREVACID) 15 MG capsule Take 15 mg by mouth daily.      . Multiple Vitamin (MULTIVITAMIN) tablet Take 1 tablet by mouth daily.      . nabumetone (RELAFEN) 500 MG tablet 500 mg 2 (two) times daily.       . Potassium Gluconate 550 MG TABS Take 550 mg by mouth once.      . valACYclovir (VALTREX) 1000 MG tablet daily.       Marland Kitchen DISCONTD: dicyclomine (BENTYL) 10 MG capsule Take 10 mg by mouth.      . dicyclomine (BENTYL) 10 MG capsule Take 1 capsule (10 mg total) by mouth 3 (three) times daily.   90 capsule  6  . nystatin-triamcinolone ointment (MYCOLOG)        Past Medical History  Diagnosis Date  . Bipolar disorder   . Coronary artery disease   . IBS (irritable bowel syndrome)   . History of kidney stones   . Arthritis   . Genital herpes    Past Surgical History  Procedure Date  . Cesarean section   . Partial hysterectomy     1981  . Breast enhancement surgery    History   Social History  . Marital Status: Married    Spouse Name: N/A    Number of Children: N/A  . Years of Education: N/A   Occupational History  . Not on file.   Social History Main Topics  . Smoking status: Current Everyday Smoker -- 0.5 packs/day for 2 years    Types: Cigarettes  . Smokeless tobacco: Not on file   Comment: 1/2 PACK A DAY30 YRS  . Alcohol Use: No  . Drug Use: No  . Sexually Active: Not on file   Other Topics Concern  . Not on file  Social History Narrative  . No narrative on file   Family Status  Relation Status Death Age  . Mother Deceased     Alzheimer's  . Father Deceased     CVA  . Sister Alive     Atrial fib  . Brother Alive     good health   No Known Allergies   Objective:   Physical Exam Filed Vitals:   12/27/11 1016  Height: 5\' 5"  (1.651 m)  Weight: 165 lb (74.844 kg)  Alert and oriented. Skin warm and dry. Oral mucosa is moist.   . Sclera anicteric, conjunctivae is pink. Thyroid not enlarged. No cervical lymphadenopathy. Lungs clear. Heart regular rate and rhythm.  Abdomen is soft. Bowel sounds are positive. No hepatomegaly. No abdominal masses felt. No tenderness.  No edema to lower extremities. Patient is alert and oriented. Stool brown and guaiac negative.      Assessment:    Lower abdominal pain, probable Irritable Syndrome. Symptoms are much better since starting the Dicyclomine. She does not have any alarm symptoms.    Plan:    continue the Dicyclomine TID. Increase fiber in diet. Call with a progress report in 2 weeks.

## 2011-12-27 NOTE — Patient Instructions (Signed)
Dicyclomine TID. Increase fiber in diet. PR in 2 weeks.

## 2012-01-04 ENCOUNTER — Encounter (HOSPITAL_COMMUNITY): Payer: Self-pay | Admitting: Psychiatry

## 2012-01-04 ENCOUNTER — Ambulatory Visit (INDEPENDENT_AMBULATORY_CARE_PROVIDER_SITE_OTHER): Payer: PRIVATE HEALTH INSURANCE | Admitting: Psychiatry

## 2012-01-04 VITALS — Wt 164.0 lb

## 2012-01-04 DIAGNOSIS — F329 Major depressive disorder, single episode, unspecified: Secondary | ICD-10-CM
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
## 2012-01-30 ENCOUNTER — Other Ambulatory Visit (HOSPITAL_COMMUNITY): Payer: Self-pay | Admitting: *Deleted

## 2012-01-30 DIAGNOSIS — F329 Major depressive disorder, single episode, unspecified: Secondary | ICD-10-CM

## 2012-01-30 DIAGNOSIS — F331 Major depressive disorder, recurrent, moderate: Secondary | ICD-10-CM

## 2012-01-30 DIAGNOSIS — F39 Unspecified mood [affective] disorder: Secondary | ICD-10-CM

## 2012-01-30 MED ORDER — DIVALPROEX SODIUM 250 MG PO DR TAB: 250.0000 mg | DELAYED_RELEASE_TABLET | ORAL | Status: DC

## 2012-01-30 MED ORDER — ESCITALOPRAM OXALATE 20 MG PO TABS: 20.0000 mg | ORAL_TABLET | Freq: Every day | ORAL | Status: DC

## 2012-03-05 ENCOUNTER — Ambulatory Visit (HOSPITAL_COMMUNITY): Payer: Self-pay | Admitting: Psychiatry

## 2012-03-14 ENCOUNTER — Encounter (HOSPITAL_COMMUNITY): Payer: Self-pay | Admitting: Psychiatry

## 2012-03-14 ENCOUNTER — Ambulatory Visit (INDEPENDENT_AMBULATORY_CARE_PROVIDER_SITE_OTHER): Payer: PRIVATE HEALTH INSURANCE | Admitting: Psychiatry

## 2012-03-14 DIAGNOSIS — F329 Major depressive disorder, single episode, unspecified: Secondary | ICD-10-CM
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
## 2012-03-19 ENCOUNTER — Ambulatory Visit (HOSPITAL_COMMUNITY): Payer: Self-pay | Admitting: Psychiatry

## 2012-04-22 ENCOUNTER — Other Ambulatory Visit (HOSPITAL_COMMUNITY): Payer: Self-pay | Admitting: *Deleted

## 2012-04-22 ENCOUNTER — Other Ambulatory Visit (HOSPITAL_COMMUNITY): Payer: Self-pay | Admitting: Psychiatry

## 2012-04-22 DIAGNOSIS — F39 Unspecified mood [affective] disorder: Secondary | ICD-10-CM

## 2012-04-22 DIAGNOSIS — F331 Major depressive disorder, recurrent, moderate: Secondary | ICD-10-CM

## 2012-04-22 DIAGNOSIS — F329 Major depressive disorder, single episode, unspecified: Secondary | ICD-10-CM

## 2012-04-22 MED ORDER — DIVALPROEX SODIUM 250 MG PO DR TAB: 250.0000 mg | DELAYED_RELEASE_TABLET | ORAL | Status: DC

## 2012-04-22 MED ORDER — ESCITALOPRAM OXALATE 20 MG PO TABS: 20.0000 mg | ORAL_TABLET | Freq: Every day | ORAL | Status: DC

## 2012-05-14 ENCOUNTER — Ambulatory Visit (INDEPENDENT_AMBULATORY_CARE_PROVIDER_SITE_OTHER): Payer: PRIVATE HEALTH INSURANCE | Admitting: Psychiatry

## 2012-05-14 ENCOUNTER — Encounter (HOSPITAL_COMMUNITY): Payer: Self-pay | Admitting: Psychiatry

## 2012-05-14 VITALS — BP 110/70 | HR 84 | Wt 162.8 lb

## 2012-05-14 DIAGNOSIS — F329 Major depressive disorder, single episode, unspecified: Secondary | ICD-10-CM
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
## 2012-07-11 ENCOUNTER — Encounter (HOSPITAL_COMMUNITY): Payer: Self-pay | Admitting: Psychiatry

## 2012-07-11 ENCOUNTER — Ambulatory Visit (INDEPENDENT_AMBULATORY_CARE_PROVIDER_SITE_OTHER): Payer: PRIVATE HEALTH INSURANCE | Admitting: Psychiatry

## 2012-07-11 DIAGNOSIS — F329 Major depressive disorder, single episode, unspecified: Secondary | ICD-10-CM

## 2012-07-11 DIAGNOSIS — F39 Unspecified mood [affective] disorder: Secondary | ICD-10-CM

## 2012-07-11 DIAGNOSIS — F331 Major depressive disorder, recurrent, moderate: Secondary | ICD-10-CM

## 2012-07-11 MED ORDER — DIVALPROEX SODIUM 250 MG PO DR TAB: 250.0000 mg | DELAYED_RELEASE_TABLET | ORAL | Status: DC

## 2012-07-11 MED ORDER — ESCITALOPRAM OXALATE 20 MG PO TABS: 20.0000 mg | ORAL_TABLET | Freq: Every day | ORAL | Status: DC
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
## 2012-10-08 ENCOUNTER — Other Ambulatory Visit: Payer: Self-pay | Admitting: Obstetrics and Gynecology

## 2012-10-08 DIAGNOSIS — Z139 Encounter for screening, unspecified: Secondary | ICD-10-CM

## 2012-10-11 ENCOUNTER — Encounter (HOSPITAL_COMMUNITY): Payer: Self-pay | Admitting: Psychiatry

## 2012-10-11 ENCOUNTER — Ambulatory Visit (INDEPENDENT_AMBULATORY_CARE_PROVIDER_SITE_OTHER): Payer: PRIVATE HEALTH INSURANCE | Admitting: Psychiatry

## 2012-10-11 VITALS — Wt 166.4 lb

## 2012-10-11 DIAGNOSIS — F331 Major depressive disorder, recurrent, moderate: Secondary | ICD-10-CM

## 2012-10-11 DIAGNOSIS — F39 Unspecified mood [affective] disorder: Secondary | ICD-10-CM

## 2012-10-11 DIAGNOSIS — F329 Major depressive disorder, single episode, unspecified: Secondary | ICD-10-CM

## 2012-10-11 DIAGNOSIS — F319 Bipolar disorder, unspecified: Secondary | ICD-10-CM

## 2012-10-11 MED ORDER — ESCITALOPRAM OXALATE 20 MG PO TABS: 20.0000 mg | ORAL_TABLET | Freq: Every day | ORAL | Status: DC

## 2012-10-11 MED ORDER — DIVALPROEX SODIUM 250 MG PO DR TAB: 250.0000 mg | DELAYED_RELEASE_TABLET | ORAL | Status: DC

## 2012-10-11 NOTE — Patient Instructions (Addendum)
Could use "Move Free" or "Osteo bi Flex" for arthritic pain.   The important ingredients are Chondrotin Sulfate and Glucosamine.  Tumeric is also helpful for arthritis.   Krill oil and cod liver oil may be helpful for arthritis.   [employer] is a great source for all of these.  [phone]  Ask your PCP about using INDERAL for blood pressure control, because it helps with controlling anxiety/ social phobia really well.   Call if problems or concerns.  [employer] by Wyn Quaker Schafe may be an interesting resource for you.  Call if problems or concerns.
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
## 2012-10-14 ENCOUNTER — Ambulatory Visit (HOSPITAL_COMMUNITY): Payer: Self-pay

## 2013-01-09 ENCOUNTER — Ambulatory Visit (INDEPENDENT_AMBULATORY_CARE_PROVIDER_SITE_OTHER): Payer: PRIVATE HEALTH INSURANCE | Admitting: Psychiatry

## 2013-01-09 ENCOUNTER — Encounter (HOSPITAL_COMMUNITY): Payer: Self-pay | Admitting: Psychiatry

## 2013-01-09 VITALS — Wt 166.6 lb

## 2013-01-09 DIAGNOSIS — F319 Bipolar disorder, unspecified: Secondary | ICD-10-CM

## 2013-01-09 DIAGNOSIS — F331 Major depressive disorder, recurrent, moderate: Secondary | ICD-10-CM

## 2013-01-09 DIAGNOSIS — F39 Unspecified mood [affective] disorder: Secondary | ICD-10-CM

## 2013-01-09 DIAGNOSIS — F329 Major depressive disorder, single episode, unspecified: Secondary | ICD-10-CM

## 2013-01-09 DIAGNOSIS — F32A Depression, unspecified: Secondary | ICD-10-CM

## 2013-01-09 MED ORDER — ESCITALOPRAM OXALATE 20 MG PO TABS: 20.0000 mg | ORAL_TABLET | Freq: Every day | ORAL | Status: DC

## 2013-01-09 MED ORDER — DIVALPROEX SODIUM 250 MG PO DR TAB: 250.0000 mg | DELAYED_RELEASE_TABLET | ORAL | Status: DC

## 2013-01-09 NOTE — Patient Instructions (Addendum)
Could use "Move Free" or "Osteo bi Flex" for arthritic pain.   The important ingredients are Chondrotin Sulfate and Glucosamine.  Tumeric is also helpful for arthritis.   Krill oil and cod liver oil may be helpful for arthritis.   [employer] is a great source for all of these.  [phone]  Chaga is a mushroom that has the strongest antiinflammatory properties of any substance known to mankind.  Among other sources, it can be ordered from [employer]  Take care of yourself.  No one else is standing up to do the job and only you know what you need.   GET SERIOUS about taking care of yourself.  Do the next right thing and that often means doing something to care for yourself along the lines of are you hungry, are you angry, are you lonely, are you tired, are you scared?  [employer] is what that stands for.  Call if problems or concerns.   
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
## 2013-04-10 ENCOUNTER — Ambulatory Visit (HOSPITAL_COMMUNITY): Payer: Self-pay | Admitting: Psychiatry

## 2013-04-21 ENCOUNTER — Telehealth (HOSPITAL_COMMUNITY): Payer: Self-pay

## 2013-04-21 ENCOUNTER — Other Ambulatory Visit (HOSPITAL_COMMUNITY): Payer: Self-pay | Admitting: Physician Assistant

## 2013-04-21 ENCOUNTER — Other Ambulatory Visit (HOSPITAL_COMMUNITY): Payer: Self-pay | Admitting: Psychiatry

## 2013-04-21 DIAGNOSIS — F331 Major depressive disorder, recurrent, moderate: Secondary | ICD-10-CM

## 2013-04-21 DIAGNOSIS — F329 Major depressive disorder, single episode, unspecified: Secondary | ICD-10-CM

## 2013-04-21 DIAGNOSIS — F39 Unspecified mood [affective] disorder: Secondary | ICD-10-CM

## 2013-04-21 MED ORDER — DIVALPROEX SODIUM 250 MG PO DR TAB: 250.0000 mg | DELAYED_RELEASE_TABLET | ORAL | Status: DC

## 2013-04-21 MED ORDER — ESCITALOPRAM OXALATE 20 MG PO TABS: 20.0000 mg | ORAL_TABLET | Freq: Every day | ORAL | Status: DC

## 2013-04-21 NOTE — Telephone Encounter (Signed)
10:39am 04/21/13 Patient called in reference to medication refill for (DEPAKOTE 250mg ) and (LEXAPRO 20MG ) patient stated that it need to be a 90 day supply or the insurance will not pay for it.//thanks/sh

## 2013-04-22 ENCOUNTER — Other Ambulatory Visit (HOSPITAL_COMMUNITY): Payer: Self-pay | Admitting: *Deleted

## 2013-04-23 ENCOUNTER — Telehealth (HOSPITAL_COMMUNITY): Payer: Self-pay | Admitting: Psychiatry

## 2013-04-23 ENCOUNTER — Other Ambulatory Visit (HOSPITAL_COMMUNITY): Payer: Self-pay | Admitting: *Deleted

## 2013-04-23 NOTE — Telephone Encounter (Signed)
Patient had called requesting refills on Lexapro and Depakote. They were refilled on 04/21/13 by Jorje Guild, did call patient pharmacy to confirm receipt. Called patient to let her know they have been received and are ready for pick up.

## 2013-05-01 ENCOUNTER — Ambulatory Visit (HOSPITAL_COMMUNITY): Payer: Self-pay | Admitting: Psychiatry

## 2013-05-08 ENCOUNTER — Ambulatory Visit (HOSPITAL_COMMUNITY): Payer: Self-pay | Admitting: Psychiatry

## 2013-05-19 ENCOUNTER — Other Ambulatory Visit (INDEPENDENT_AMBULATORY_CARE_PROVIDER_SITE_OTHER): Payer: Self-pay | Admitting: Internal Medicine

## 2013-05-26 ENCOUNTER — Other Ambulatory Visit: Payer: Self-pay | Admitting: Adult Health

## 2013-05-28 ENCOUNTER — Encounter (HOSPITAL_COMMUNITY): Payer: Self-pay | Admitting: Psychiatry

## 2013-05-28 ENCOUNTER — Ambulatory Visit (INDEPENDENT_AMBULATORY_CARE_PROVIDER_SITE_OTHER): Payer: PRIVATE HEALTH INSURANCE | Admitting: Psychiatry

## 2013-05-28 ENCOUNTER — Other Ambulatory Visit: Payer: Self-pay | Admitting: Adult Health

## 2013-05-28 VITALS — BP 180/110 | Ht 65.0 in | Wt 165.0 lb

## 2013-05-28 DIAGNOSIS — F32A Depression, unspecified: Secondary | ICD-10-CM

## 2013-05-28 DIAGNOSIS — F3289 Other specified depressive episodes: Secondary | ICD-10-CM

## 2013-05-28 DIAGNOSIS — F329 Major depressive disorder, single episode, unspecified: Secondary | ICD-10-CM

## 2013-05-28 MED ORDER — ESTRADIOL 2 MG PO TABS: 2.0000 mg | ORAL_TABLET | Freq: Every day | ORAL | Status: DC

## 2013-05-28 NOTE — Telephone Encounter (Signed)
Pt requesting RX for estradiol 2mg  daily until her appt Sept.
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
## 2013-07-18 ENCOUNTER — Encounter (INDEPENDENT_AMBULATORY_CARE_PROVIDER_SITE_OTHER): Payer: Self-pay

## 2013-07-18 ENCOUNTER — Ambulatory Visit (INDEPENDENT_AMBULATORY_CARE_PROVIDER_SITE_OTHER): Payer: 59 | Admitting: Adult Health

## 2013-07-18 ENCOUNTER — Encounter: Payer: Self-pay | Admitting: Adult Health

## 2013-07-18 VITALS — BP 150/76 | HR 78 | Ht 65.0 in | Wt 165.0 lb

## 2013-07-18 DIAGNOSIS — Z01419 Encounter for gynecological examination (general) (routine) without abnormal findings: Secondary | ICD-10-CM

## 2013-07-18 DIAGNOSIS — Z79899 Other long term (current) drug therapy: Secondary | ICD-10-CM

## 2013-07-18 DIAGNOSIS — N760 Acute vaginitis: Secondary | ICD-10-CM

## 2013-07-18 DIAGNOSIS — Z1212 Encounter for screening for malignant neoplasm of rectum: Secondary | ICD-10-CM

## 2013-07-18 LAB — HEMOCCULT GUIAC POC 1CARD (OFFICE): Fecal Occult Blood, POC: NEGATIVE

## 2013-07-18 MED ORDER — HYDROCODONE-ACETAMINOPHEN 5-325 MG PO TABS: ORAL_TABLET | ORAL | Status: DC

## 2013-07-18 MED ORDER — METRONIDAZOLE 500 MG PO TABS: 500.0000 mg | ORAL_TABLET | Freq: Two times a day (BID) | ORAL | Status: DC

## 2013-07-18 MED ORDER — SULFAMETHOXAZOLE-TMP DS 800-160 MG PO TABS: 1.0000 | ORAL_TABLET | Freq: Two times a day (BID) | ORAL | Status: DC

## 2013-07-18 MED ORDER — PROMETHAZINE HCL 25 MG PO TABS: 25.0000 mg | ORAL_TABLET | Freq: Four times a day (QID) | ORAL | Status: DC | PRN

## 2013-07-18 MED ORDER — ESTRADIOL 2 MG PO TABS: 2.0000 mg | ORAL_TABLET | Freq: Every day | ORAL | Status: DC

## 2013-07-18 NOTE — Progress Notes (Signed)
Patient ID: Abigail Wagner, female   DOB: [birth year], 62 y.o.   MRN: [id number] History of Present Illness: Abigail Wagner is a 62 year old white female married in for physical and complains of sore spot in vagina,has history of herpes, but no lesion seen.Started about 2-3 days ago.   Current Medications, Allergies, Past Medical History, Past Surgical History, Family History and Social History were reviewed in [employer] record.   Past Medical History  Diagnosis Date  . IBS (irritable bowel syndrome)   . History of kidney stones   . Arthritis   . Genital herpes   . HTN (hypertension)   . Depression   . Current use of estrogen therapy   . Back pain   . Abscess, vagina 07/18/2013    Will rx septra and flagyl and F/U in 2 days  Current outpatient prescriptions:ALTABAX 1 % ointment, Apply 1 application topically as needed. , Disp: , Rfl: ;  ascorbic acid (VITAMIN C) 500 MG tablet, Take 500 mg by mouth daily., Disp: , Rfl: ;  dicyclomine (BENTYL) 10 MG capsule, TAKE 1 CAPSULE (10 MG TOTAL) BY MOUTH 3 (THREE) TIMES DAILY., Disp: 90 capsule, Rfl: 3;  divalproex (DEPAKOTE) 250 MG DR tablet, Take 1 tablet (250 mg total) by mouth 1 day or 1 dose., Disp: 90 tablet, Rfl: 0 escitalopram (LEXAPRO) 20 MG tablet, Take 1 tablet (20 mg total) by mouth daily., Disp: 90 tablet, Rfl: 0;  estradiol (ESTRACE) 2 MG tablet, Take 1 tablet (2 mg total) by mouth daily., Disp: 30 tablet, Rfl: 11;  fluticasone (CUTIVATE) 0.05 % cream, Apply topically 2 (two) times daily., Disp: , Rfl: ;  folic acid (FOLVITE) 800 MCG tablet, Take 400 mcg by mouth daily., Disp: , Rfl:  lansoprazole (PREVACID) 15 MG capsule, Take 15 mg by mouth daily., Disp: , Rfl: ;  metoprolol tartrate (LOPRESSOR) 25 MG tablet, , Disp: , Rfl: ;  Multiple Vitamin (MULTIVITAMIN) tablet, Take 1 tablet by mouth daily., Disp: , Rfl: ;  nabumetone (RELAFEN) 500 MG tablet, 500 mg 2 (two) times daily. , Disp: , Rfl: ;  nystatin-triamcinolone ointment  (MYCOLOG), , Disp: , Rfl: ;  Potassium Gluconate 550 MG TABS, Take 550 mg by mouth once., Disp: , Rfl:  valACYclovir (VALTREX) 1000 MG tablet, daily. , Disp: , Rfl: ;  HYDROcodone-acetaminophen (NORCO/VICODIN) 5-325 MG per tablet, Take 1-2 tabs every 4-6 prn pain, Disp: 30 tablet, Rfl: 0;  metroNIDAZOLE (FLAGYL) 500 MG tablet, Take 1 tablet (500 mg total) by mouth 2 (two) times daily., Disp: 14 tablet, Rfl: 0;  promethazine (PHENERGAN) 25 MG tablet, Take 1 tablet (25 mg total) by mouth every 6 (six) hours as needed for nausea., Disp: 30 tablet, Rfl: 1 sulfamethoxazole-trimethoprim (BACTRIM DS) 800-160 MG per tablet, Take 1 tablet by mouth 2 (two) times daily., Disp: 28 tablet, Rfl: 0  Review of Systems: Patient denies any headaches, blurred vision, shortness of breath, chest pain, abdominal pain, problems with urination, or intercourse.No joint swelling or mood changes. Has had IBS recently,still taking estrogen and wants to continue.See HPI.    Physical Exam:BP 150/76  Pulse 78  Ht 5\' 5"  (1.651 m)  Wt 165 lb (74.844 kg)  BMI 27.46 kg/m2 General:  Well developed, well nourished, no acute distress Skin:  Warm and dry Neck:  Midline trachea, normal thyroid Lungs; Clear to auscultation bilaterally Breast:  No dominant palpable mass, retraction, or nipple discharge, has implants Cardiovascular: Regular rate and rhythm Abdomen:  Soft, non tender, no hepatosplenomegaly Pelvic:  External genitalia has swelling and some redness left labia.  The vagina is normal in appearance,but has 1-2 cm nodule under skin that is felt at base of left labia and is tender, no opening or drainage noted.  The cervix and uterus are absent.  No  adnexal masses or tenderness noted. Rectal: Good sphincter tone, no polyps,has interanl hemorrhoid felt.  Hemoccult negative.Can feel nodule at base of left labia rectally, Dr Emelda Fear in to do co exam. Extremities:  No swelling  noted Psych:  Alert and  cooperative   Impression: Yearly gyn exam no pap Estrogen therapy Labial abscess   Plan: Rx Septra DS 1 bid x 14 days #28 no refills Rx Flagyl 500 mg 1 bid x 7 days, no etoh Rx Norco 5/325 mg #30 1-2 every 4-6 hours prn pain Rx phenergan 25 mg #30 1 every 6 hours prn N/V with 1 refill Refilled estrace 2 mg x 1 year Follow up in 3 days with Dr Emelda Fear for possible I&D If gets worse go to ER for I&D Physical in 1 year Mammogram now and yearly Colonoscopy pr GI has last 2011 Labs with PCP Can sit in warm bath Review handout on perineal abscess

## 2013-07-18 NOTE — Patient Instructions (Signed)
Physical in 1 year Mammogram now and yearly Colonoscopy as per GI Follow up in 3 days with Dr Emelda Fear If has increase in pain go to ER for I&D,  Perineal Abscess An abscess is a sac filled infection. The perineum is the area between the anus and the scrotum in the female and between the anus and the vulva (the labial opening to the vagina) in the female. The size of the abscess varies. If it remains open and begins draining, it can form a fistula. A fistula is a tract that drains the abscess to the surface. The peak incidence of this problem is in the third to fourth decades of life. Men are affected more frequently than women.  CAUSES  There are numerous causes for this type of infection. In women  The spread of cancer from pelvic organs such as the uterus and ovaries or the spread of infection from special glands located near the vagina.  Problems that occur at the time or soon after delivering a baby:  Infected lochia (the fluid that weeps from the vagina for a week or so after delivery of a baby).  Stool contaminating an episiotomy (a surgical procedure for widening the outlet of the birth canal to facilitate delivery of the baby and to avoid a jagged rip of the perineum).  Poor hygiene could all contribute to forming an abscess of the perineum. In men  Infection of the prostate gland or the spread of cancer from the prostate. In men and women  Complication of surgery to the rectum.  Diseases of the colon (such as Crohn's disease).  The spread of cancer from the rectum.  It is also more common when the immune system is depressed:  Patients receiving chemotherapy.  Patients with AIDS. SYMPTOMS  Pain, swelling, and redness in the area of the abscess are the most frequent symptoms. The redness may go beyond the abscess and appear as a red streak on the skin. There is often a visible lump or a lump that can be felt when touching the area. Bleeding, pus-like discharge, fevers, and  general weakness may also be present.  DIAGNOSIS  An examination by your caregiver will establish this ailment. A digital rectal exam, and in women, a careful vaginal exam, is sometimes necessary. Sometimes looking into your rectum with a special instrument is needed.  TREATMENT  Incision and drainage is one common treatment of an abscess. This can sometimes be done in an office, emergency department, or surgical center with a local anesthetic. For larger or deeper abscesses, an operation is required to drain the abscess. Antibiotics are sometimes used if there is cellulitis (infection of the surrounding tissue). Antibiotics are medications that kill bacteria germs. Antibiotics may also be used if there are immune problems or heart valve problems.  Packing of the wound is sometimes done to produce good drainage. Frequent sitz baths may be recommended to help the wound heal in from the base and sides so that the risk of continued infection and recurrence is reduced. HOME CARE INSTRUCTIONS   If a gauze pack is used in the abscess, it can usually be removed in 2 to 3 days or as directed.  If one or more drains have been placed in the abscess cavity, be careful not to pull at them. Your caregiver will tell you how long they need to remain in place.  Use warm sitz baths 3 to 4 times per day and after bowel movements.  Keep the skin around the  abscess clean and dry. Avoid cleaning the area too much or scratching.  Avoid using colored or perfumed toilet papers.  Use pain medication, antibiotics, or other medications that your caregiver tells you to use. SEEK MEDICAL CARE IF:   You have problems having a bowel movement or passing your urine.  The gauze packing or the drain(s) come out before the planned time.  Swelling or pain in the affected area does not seem to be improving as expected.  You feel you have side effects from prescribed medication. SEEK IMMEDIATE MEDICAL CARE IF:   You develop  problems moving or using your legs.  You develop severe or increasing pain.  Swelling of the affected area suddenly gets worse.  You have increased bleeding or passing of pus.  You develop chills or fever above 101 F (38.3 C). MAKE SURE YOU:   Understand these instructions.  Will watch your condition.  Will get help right away if you are not doing well or get worse. Document Released: 10/18/2006 Document Revised: 12/04/2011 Document Reviewed: 11/18/2007 [facility] Patient Information 2014 [place], [place].

## 2013-07-21 ENCOUNTER — Encounter: Payer: Self-pay | Admitting: Obstetrics and Gynecology

## 2013-07-21 ENCOUNTER — Other Ambulatory Visit: Payer: Self-pay | Admitting: *Deleted

## 2013-07-21 ENCOUNTER — Ambulatory Visit (INDEPENDENT_AMBULATORY_CARE_PROVIDER_SITE_OTHER): Payer: 59 | Admitting: Obstetrics and Gynecology

## 2013-07-21 VITALS — BP 150/80 | Ht 65.0 in | Wt 165.0 lb

## 2013-07-21 DIAGNOSIS — N758 Other diseases of Bartholin's gland: Secondary | ICD-10-CM

## 2013-07-21 DIAGNOSIS — N72 Inflammatory disease of cervix uteri: Secondary | ICD-10-CM

## 2013-07-21 MED ORDER — ESTRADIOL 2 MG PO TABS: 2.0000 mg | ORAL_TABLET | Freq: Every day | ORAL | Status: DC

## 2013-07-21 NOTE — Patient Instructions (Signed)
  Bartholin's Cyst and Abscess  Bartholin's glands produce mucus through small openings just outside the opening of the vagina. The mucus helps with lubrication around the vagina during sexual intercourse. If the duct becomes clogged, the gland will swell and cause a bulge on the inside of the vagina. If this becomes big enough, it can be seen and felt on the outside of the vagina as well. Sometimes, the swelling will shrink away by itself. However, if the cyst becomes infected, the Bartholin's cyst fills with pus and becomes more swollen, red and painful and becomes a Bartholin's abscess. This usually requires antibiotic treatment and surgical drainage. Sometimes, with minor surgery under local anesthesia, a small tube is placed in the cyst or abscess wall. This allows continued drainage for up to 6 weeks. Minor surgery can make a new opening to replace the clogged duct and help prevent future cysts or abscess.  If the abscess occurs several times, a minor operation with local anesthesia is necessary to remove the Bartholin's gland completely or to make it drain better. Cutting open the gland and suturing the edges to make the opening of the gland bigger (marsupialization) may be needed and should usually be done by your obstetrician-gyncology physician. Antibiotics are usually prescribed for this condition. Take all antibiotics as prescribed. Make sure to finish them even if you are doing better. Take warm sitz baths for 20 minutes, 3 times a day. See your caregiver for follow-up care as recommended.  SEEK MEDICAL CARE IF:    You have increasing pain, swelling, or redness near the vagina.   You have vomiting or inability to tolerate medicines.   You have a fever.   You have uncontrolled bleeding from the vagina.  Document Released: 10/19/2004 Document Revised: 12/04/2011 Document Reviewed: 10/22/2009  [employer] Patient Information 2014 [employer].

## 2013-07-21 NOTE — Progress Notes (Signed)
Patient ID: Abigail Wagner, female   DOB: [birth year], 62 y.o.   MRN: [id number] Pt here to follow up for vaginal abscess. Pt states taking Bactrim as prescribed by Cyril Mourning, NP and abscess been draining well since last Friday.    Physical Examination: Pelvic - VULVA: full-blown shows a tiny pinhole drainage in the midline from what I think was a left Bartholin's abscess. There is no surrounding erythema in the area is nontender. We'll simply continue the antibiotics, and given patient instructions regarding what to do the cyst reoccurs so that we can perform an incision and drainage prior to infection  Assessment spontaneously draining left Bartholin's abscess

## 2013-07-28 ENCOUNTER — Other Ambulatory Visit (HOSPITAL_COMMUNITY): Payer: Self-pay | Admitting: Psychiatry

## 2013-07-28 ENCOUNTER — Telehealth (HOSPITAL_COMMUNITY): Payer: Self-pay | Admitting: *Deleted

## 2013-07-28 DIAGNOSIS — F331 Major depressive disorder, recurrent, moderate: Secondary | ICD-10-CM

## 2013-07-28 DIAGNOSIS — F329 Major depressive disorder, single episode, unspecified: Secondary | ICD-10-CM

## 2013-07-28 DIAGNOSIS — F39 Unspecified mood [affective] disorder: Secondary | ICD-10-CM

## 2013-07-28 MED ORDER — ESCITALOPRAM OXALATE 20 MG PO TABS: 20.0000 mg | ORAL_TABLET | Freq: Every day | ORAL | Status: DC

## 2013-07-28 MED ORDER — DIVALPROEX SODIUM 250 MG PO DR TAB: 250.0000 mg | DELAYED_RELEASE_TABLET | ORAL | Status: DC

## 2013-07-28 NOTE — Telephone Encounter (Signed)
Both sent to pharmacy

## 2013-08-27 ENCOUNTER — Ambulatory Visit (INDEPENDENT_AMBULATORY_CARE_PROVIDER_SITE_OTHER): Payer: PRIVATE HEALTH INSURANCE | Admitting: Psychiatry

## 2013-08-27 ENCOUNTER — Encounter (HOSPITAL_COMMUNITY): Payer: Self-pay | Admitting: Psychiatry

## 2013-08-27 VITALS — BP 160/90 | Ht 65.0 in | Wt 165.0 lb

## 2013-08-27 DIAGNOSIS — F39 Unspecified mood [affective] disorder: Secondary | ICD-10-CM

## 2013-08-27 DIAGNOSIS — F331 Major depressive disorder, recurrent, moderate: Secondary | ICD-10-CM

## 2013-08-27 DIAGNOSIS — F329 Major depressive disorder, single episode, unspecified: Secondary | ICD-10-CM

## 2013-08-27 MED ORDER — ESCITALOPRAM OXALATE 20 MG PO TABS: 20.0000 mg | ORAL_TABLET | Freq: Every day | ORAL | Status: DC

## 2013-08-27 MED ORDER — DIVALPROEX SODIUM 250 MG PO DR TAB: 250.0000 mg | DELAYED_RELEASE_TABLET | ORAL | Status: DC
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
## 2013-11-05 ENCOUNTER — Encounter: Payer: Self-pay | Admitting: Obstetrics and Gynecology

## 2013-11-05 ENCOUNTER — Encounter (INDEPENDENT_AMBULATORY_CARE_PROVIDER_SITE_OTHER): Payer: Self-pay

## 2013-11-05 ENCOUNTER — Ambulatory Visit (INDEPENDENT_AMBULATORY_CARE_PROVIDER_SITE_OTHER): Payer: 59 | Admitting: Obstetrics and Gynecology

## 2013-11-05 VITALS — BP 146/90 | Ht 65.0 in | Wt 162.0 lb

## 2013-11-05 DIAGNOSIS — L02215 Cutaneous abscess of perineum: Secondary | ICD-10-CM

## 2013-11-05 DIAGNOSIS — L02219 Cutaneous abscess of trunk, unspecified: Secondary | ICD-10-CM

## 2013-11-05 DIAGNOSIS — K612 Anorectal abscess: Secondary | ICD-10-CM

## 2013-11-05 DIAGNOSIS — L03319 Cellulitis of trunk, unspecified: Secondary | ICD-10-CM

## 2013-11-05 MED ORDER — TRAMADOL HCL 50 MG PO TABS: 50.0000 mg | ORAL_TABLET | Freq: Four times a day (QID) | ORAL | Status: DC | PRN

## 2013-11-05 MED ORDER — CIPROFLOXACIN HCL 500 MG PO TABS: 500.0000 mg | ORAL_TABLET | Freq: Two times a day (BID) | ORAL | Status: DC

## 2013-11-05 NOTE — Progress Notes (Signed)
This chart was scribed by Jenne Campus, Medical Scribe, for Dr. Mallory Shirk on 11/05/13 at 11:05 AM. This chart was reviewed by Dr. Mallory Shirk and is accurate.     [facility] Visit  Patient name: Abigail Wagner MRN [id number]  Date of birth: 1950/10/16  CC & HPI:  Abigail Wagner is a 63 y.o. female presenting today for a ?Bartholin's cyst that started hurting yesterday. Similar episode in October that was treated with antibiotics. The area had spontaneously drained into the vagina at the time with a tx of warm compresses at home.   ROS:  ?Bartholin's cyst, no other complaints  Pertinent History Reviewed:  Medical & Surgical Hx:  Reviewed: Significant for a Bartholin's cyst Medications: Reviewed & Updated - see associated section Social History: Reviewed -  reports that she has been smoking Cigarettes.  She has a 20 pack-year smoking history. She has never used smokeless tobacco.  Objective Findings:  Vitals: BP 146/90  Ht 5\' 5"  (1.651 m)  Wt 162 lb (73.483 kg)  BMI 26.96 kg/m2 Chaperone present. Exam performed with pt's permission and was completed without any severe discomfort or complications. Physical Examination: General appearance - alert, well appearing, and in no distress and oriented to person, place, and time Mental status - alert, oriented to person, place, and time, normal mood, behavior, speech, dress, motor activity, and thought processes Pelvic - normal external genitalia, perianal abscess, no drainage Rectal: 8 mm small, thrombosed, external hemorrhoid   Incision and Drainage of Perirectal abscess: Consent by pt given Local injection of 8mL 1% of lidocaine made. Incision made with 11 blade scalpel, 2 punctures near entrance of perineum. Copious amount of purulent drainage obtained. No odor loculated pockets broken up. Pt tolerated procedure well. Packing placed, Iodoform wick,  Thru and thru technique. Pt to return 5 d for removal  Assessment & Plan:   I&D of perirectal abscess with placement of iodoform wick Pt will return in 5 days for packing removal

## 2013-11-05 NOTE — Patient Instructions (Signed)
Abscess  Care After  An abscess (also called a boil or furuncle) is an infected area that contains a collection of pus. Signs and symptoms of an abscess include pain, tenderness, redness, or hardness, or you may feel a moveable soft area under your skin. An abscess can occur anywhere in the body. The infection may spread to surrounding tissues causing cellulitis. A cut (incision) by the surgeon was made over your abscess and the pus was drained out. Gauze may have been packed into the space to provide a drain that will allow the cavity to heal from the inside outwards. The boil may be painful for 5 to 7 days. Most people with a boil do not have high fevers. Your abscess, if seen early, may not have localized, and may not have been lanced. If not, another appointment may be required for this if it does not get better on its own or with medications.  HOME CARE INSTRUCTIONS   · Only take over-the-counter or prescription medicines for pain, discomfort, or fever as directed by your caregiver.  · When you bathe, soak and then remove gauze or iodoform packs at least daily or as directed by your caregiver. You may then wash the wound gently with mild soapy water. Repack with gauze or do as your caregiver directs.  SEEK IMMEDIATE MEDICAL CARE IF:   · You develop increased pain, swelling, redness, drainage, or bleeding in the wound site.  · You develop signs of generalized infection including muscle aches, chills, fever, or a general ill feeling.  · An oral temperature above 102° F (38.9° C) develops, not controlled by medication.  See your caregiver for a recheck if you develop any of the symptoms described above. If medications (antibiotics) were prescribed, take them as directed.  Document Released: 03/30/2005 Document Revised: 12/04/2011 Document Reviewed: 11/25/2007  ExitCare® Patient Information ©2014 [employer].

## 2013-11-05 NOTE — Progress Notes (Signed)
Pt scheduled for return appt.

## 2013-11-10 ENCOUNTER — Ambulatory Visit (INDEPENDENT_AMBULATORY_CARE_PROVIDER_SITE_OTHER): Payer: 59 | Admitting: Obstetrics and Gynecology

## 2013-11-10 ENCOUNTER — Encounter: Payer: Self-pay | Admitting: Obstetrics and Gynecology

## 2013-11-10 VITALS — BP 122/68 | Ht 65.0 in | Wt 162.2 lb

## 2013-11-10 DIAGNOSIS — L03319 Cellulitis of trunk, unspecified: Secondary | ICD-10-CM

## 2013-11-10 DIAGNOSIS — L02219 Cutaneous abscess of trunk, unspecified: Secondary | ICD-10-CM

## 2013-11-10 DIAGNOSIS — L02215 Cutaneous abscess of perineum: Secondary | ICD-10-CM

## 2013-11-10 NOTE — Patient Instructions (Addendum)
Renew antibiotics once more.. Please let me see you once all antibiotics are finished and your are back to normal

## 2013-11-10 NOTE — Progress Notes (Signed)
This chart was transcribed for Dr. Mallory Shirk by Ludger Nutting, ED scribe.   [facility] Visit  Patient name: Abigail Wagner MRN [id number]  Date of birth: [birth year]  CC & HPI:  Abigail Wagner is a 63 y.o. female presenting today for an I&D recheck. Pt was seen on 11/05/13 for a perirectal abscess. She had an iodoform wick placed at that time but it has already fallen out.   ROS:  Negative except listed above.   Pertinent History Reviewed:  Medical & Surgical Hx:  Reviewed: Significant for  Past Medical History  Diagnosis Date  . IBS (irritable bowel syndrome)   . History of kidney stones   . Arthritis   . Genital herpes   . HTN (hypertension)   . Depression   . Current use of estrogen therapy   . Back pain   . Abscess, vagina 07/18/2013    Will rx septra and flagyl and F/U in 2 days     Past Surgical History  Procedure Laterality Date  . Cesarean section    . Partial hysterectomy      1981  . Breast enhancement surgery      Medications: Reviewed & Updated - see associated section Social History: Reviewed -  reports that she has been smoking Cigarettes.  She has a 20 pack-year smoking history. She has never used smokeless tobacco.  Objective Findings:  Vitals: BP 122/68  Ht 5\' 5"  (1.651 m)  Wt 162 lb 3.2 oz (73.573 kg)  BMI 26.99 kg/m2  Physical Examination: Pelvic - normal external genitalia, vulva, vagina, cervix, uterus and adnexa Rectal - normal rectal, no masses The perineal body is thick, with a discrete area of firmness, in RV septum, just to left of midline, not esp tender. Vag epithelium now 95 % reapproximated  Assessment & Plan:    A: Resolving perineal abscess  P: continue antibiotics x 10 more days. 1. Follow up in 6 weeks.  2.

## 2013-12-22 ENCOUNTER — Ambulatory Visit: Payer: 59 | Admitting: Obstetrics and Gynecology

## 2013-12-24 ENCOUNTER — Ambulatory Visit: Payer: 59 | Admitting: Obstetrics and Gynecology

## 2013-12-29 ENCOUNTER — Encounter (HOSPITAL_COMMUNITY): Payer: Self-pay | Admitting: Psychiatry

## 2013-12-29 ENCOUNTER — Ambulatory Visit (INDEPENDENT_AMBULATORY_CARE_PROVIDER_SITE_OTHER): Payer: PRIVATE HEALTH INSURANCE | Admitting: Psychiatry

## 2013-12-29 VITALS — BP 160/90 | Ht 65.0 in | Wt 162.0 lb

## 2013-12-29 DIAGNOSIS — F331 Major depressive disorder, recurrent, moderate: Secondary | ICD-10-CM

## 2013-12-29 DIAGNOSIS — F39 Unspecified mood [affective] disorder: Secondary | ICD-10-CM

## 2013-12-29 DIAGNOSIS — F329 Major depressive disorder, single episode, unspecified: Secondary | ICD-10-CM

## 2013-12-29 MED ORDER — DIVALPROEX SODIUM 250 MG PO DR TAB: 250.0000 mg | DELAYED_RELEASE_TABLET | ORAL | Status: DC

## 2013-12-29 MED ORDER — ESCITALOPRAM OXALATE 20 MG PO TABS: 20.0000 mg | ORAL_TABLET | Freq: Every day | ORAL | Status: DC
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
## 2013-12-31 ENCOUNTER — Ambulatory Visit: Payer: 59 | Admitting: Obstetrics and Gynecology

## 2014-01-21 ENCOUNTER — Ambulatory Visit (INDEPENDENT_AMBULATORY_CARE_PROVIDER_SITE_OTHER): Payer: 59 | Admitting: Obstetrics and Gynecology

## 2014-01-21 ENCOUNTER — Encounter: Payer: Self-pay | Admitting: Obstetrics and Gynecology

## 2014-01-21 VITALS — BP 150/88 | Ht 65.0 in | Wt 162.0 lb

## 2014-01-21 DIAGNOSIS — L03319 Cellulitis of trunk, unspecified: Secondary | ICD-10-CM

## 2014-01-21 DIAGNOSIS — L02219 Cutaneous abscess of trunk, unspecified: Secondary | ICD-10-CM

## 2014-01-21 DIAGNOSIS — L02215 Cutaneous abscess of perineum: Secondary | ICD-10-CM

## 2014-01-21 DIAGNOSIS — Z9889 Other specified postprocedural states: Secondary | ICD-10-CM

## 2014-01-21 NOTE — Progress Notes (Signed)
This chart was scribed by Jenne Campus, Medical Scribe, for Dr. Mallory Shirk on 01/21/14  at 10:23 AM. This chart was reviewed by Dr. Mallory Shirk and is accurate.  Subjective:  Abigail Wagner is a 63 y.o. female who presents to the clinic 6 weeks status post I&D perirectal abscess to the left buttock  Review of Systems Negative except small draining abscess to the right buttock  She has been eating a regular diet without difficulty.   Bowel movements are normal. The patient is not having any pain.  Objective:  BP 150/88  Ht 5\' 5"  (1.651 m)  Wt 162 lb (73.483 kg)  BMI 26.96 kg/m2 General:Well developed, well nourished.  No acute distress. Abdomen: Bowel sounds normal, soft, non-tender. Pelvic Exam: Right gluteal area there is a 1 cm draining abscess with underlying induration. Large waxy plug able to be expressed.   Incision(s):  well healed I&D area to the left gluteal area, no drainage, no erythema, no hernia, no swelling, no dehiscence, incision well approximated.   Assessment:  Post-Op 6 weeks s/p I&D perirectal abscess to the left gluteal area   Doing well postoperatively.   Plan:  1.Wound care discussed   2. .Continue any current medications. 3. Activity restrictions: none 4. return to work: not applicable. 5. Follow up PRN.

## 2014-03-17 ENCOUNTER — Ambulatory Visit (INDEPENDENT_AMBULATORY_CARE_PROVIDER_SITE_OTHER): Payer: 59 | Admitting: Obstetrics and Gynecology

## 2014-03-17 ENCOUNTER — Encounter: Payer: Self-pay | Admitting: Obstetrics and Gynecology

## 2014-03-17 VITALS — BP 120/72 | Ht 65.0 in | Wt 163.0 lb

## 2014-03-17 DIAGNOSIS — K645 Perianal venous thrombosis: Secondary | ICD-10-CM

## 2014-03-17 MED ORDER — HYDROCORTISONE 2.5 % RE CREA: 1.0000 "application " | TOPICAL_CREAM | Freq: Two times a day (BID) | RECTAL | Status: DC

## 2014-03-17 NOTE — Progress Notes (Signed)
Patient ID: BRENN GATTON, female   DOB: [birth year], 63 y.o.   MRN: [id number]   [facility] Visit  Patient name: MAYKAYLA HIGHLEY MRN [id number]  Date of birth: [birth year]  CC & HPI:  CARO BRUNDIDGE is a 63 y.o. female presenting today for follow up I&D, pt states that she thinks the bartholin's cyst has come back. Pt states that the area is swollen and some drainage (blood). Pt states that it started about 2 days ago. Pt upset, crying about recurrent places on her bottom.   ROS:  Has "IBS " has multiple loose BM if food is spicy. Or other unknown factors. Having hot sweats., worse in last few days. Feels like a train wreck but other factors.  Pertinent History Reviewed:   Reviewed: Significant for                                     Surgical Hx:    Medications: Reviewed & Updated - see associated section Social History: Reviewed -  reports that she has been smoking Cigarettes.  She has a 20 pack-year smoking history. She has never used smokeless tobacco. HUsband is caregiver to his 23 year mother. Still at home. Has [employer] pension.  Objective Findings:  Vitals: Blood pressure 120/72, height 5\' 5"  (1.651 m), weight 163 lb (73.936 kg).  Physical Examination: external hemorrhoid, healing. Skin irritation on right panty line. DEY:CXKGYJ Rt buttock: sebaceous cyst Anus:thrombosed hemorrhoid now spont separated.   Assessment & Plan:  Thrombosed hemorrhoid Procto cream

## 2014-04-07 ENCOUNTER — Other Ambulatory Visit (HOSPITAL_COMMUNITY): Payer: Self-pay | Admitting: Family Medicine

## 2014-04-07 ENCOUNTER — Telehealth: Payer: Self-pay | Admitting: Obstetrics and Gynecology

## 2014-04-07 ENCOUNTER — Ambulatory Visit (HOSPITAL_COMMUNITY)
Admission: RE | Admit: 2014-04-07 | Discharge: 2014-04-07 | Disposition: A | Discharge status: Home / Self Care | Payer: 59 | Source: Ambulatory Visit | Attending: Family Medicine | Admitting: Family Medicine

## 2014-04-07 ENCOUNTER — Encounter (HOSPITAL_COMMUNITY): Payer: Self-pay

## 2014-04-07 DIAGNOSIS — R109 Unspecified abdominal pain: Secondary | ICD-10-CM

## 2014-04-07 DIAGNOSIS — Z90710 Acquired absence of both cervix and uterus: Secondary | ICD-10-CM | POA: Insufficient documentation

## 2014-04-07 DIAGNOSIS — K7689 Other specified diseases of liver: Secondary | ICD-10-CM | POA: Insufficient documentation

## 2014-04-07 DIAGNOSIS — I700 Atherosclerosis of aorta: Secondary | ICD-10-CM | POA: Insufficient documentation

## 2014-04-07 DIAGNOSIS — R1031 Right lower quadrant pain: Secondary | ICD-10-CM | POA: Insufficient documentation

## 2014-04-07 DIAGNOSIS — I708 Atherosclerosis of other arteries: Secondary | ICD-10-CM | POA: Insufficient documentation

## 2014-04-07 MED ORDER — IOHEXOL 300 MG/ML  SOLN
100.0000 mL | Freq: Once | INTRAMUSCULAR | Status: AC | PRN
Start: 2014-04-07 — End: 2014-04-07
  Administered 2014-04-07: 100 mL via INTRAVENOUS

## 2014-04-07 NOTE — Telephone Encounter (Signed)
Patient with recent CT abdomen with ? Calcification and nodularity like found in  Ovarian cancer is noted in pt abdomne. Pt needs pelvic u/s and Ca 125 and referral back to our office to review.\ Office will contact pt for followup appt.

## 2014-04-09 ENCOUNTER — Other Ambulatory Visit (HOSPITAL_COMMUNITY): Payer: Self-pay | Admitting: Family Medicine

## 2014-04-09 DIAGNOSIS — D4959 Neoplasm of unspecified behavior of other genitourinary organ: Secondary | ICD-10-CM

## 2014-04-09 DIAGNOSIS — R9389 Abnormal findings on diagnostic imaging of other specified body structures: Secondary | ICD-10-CM

## 2014-04-10 ENCOUNTER — Other Ambulatory Visit (HOSPITAL_COMMUNITY): Payer: Self-pay | Admitting: Family Medicine

## 2014-04-10 ENCOUNTER — Ambulatory Visit (HOSPITAL_COMMUNITY)
Admission: RE | Admit: 2014-04-10 | Discharge: 2014-04-10 | Disposition: A | Discharge status: Home / Self Care | Payer: 59 | Source: Ambulatory Visit | Attending: Family Medicine | Admitting: Family Medicine

## 2014-04-10 DIAGNOSIS — D4959 Neoplasm of unspecified behavior of other genitourinary organ: Secondary | ICD-10-CM

## 2014-04-10 DIAGNOSIS — N949 Unspecified condition associated with female genital organs and menstrual cycle: Secondary | ICD-10-CM | POA: Insufficient documentation

## 2014-04-10 DIAGNOSIS — R9389 Abnormal findings on diagnostic imaging of other specified body structures: Secondary | ICD-10-CM | POA: Insufficient documentation

## 2014-04-15 ENCOUNTER — Telehealth: Payer: Self-pay | Admitting: Obstetrics and Gynecology

## 2014-04-15 ENCOUNTER — Other Ambulatory Visit: Payer: 59

## 2014-04-15 ENCOUNTER — Ambulatory Visit (INDEPENDENT_AMBULATORY_CARE_PROVIDER_SITE_OTHER): Payer: 59 | Admitting: Obstetrics and Gynecology

## 2014-04-15 ENCOUNTER — Ambulatory Visit: Payer: 59 | Admitting: Obstetrics and Gynecology

## 2014-04-15 VITALS — BP 200/110 | Ht 65.0 in | Wt 161.0 lb

## 2014-04-15 DIAGNOSIS — G8929 Other chronic pain: Secondary | ICD-10-CM

## 2014-04-15 DIAGNOSIS — R1031 Right lower quadrant pain: Secondary | ICD-10-CM

## 2014-04-15 NOTE — Progress Notes (Signed)
This chart was scribed by Ludger Nutting, Medical Scribe, for Dr. Mallory Shirk on 04/15/14 at 10:09 AM. This chart was reviewed by Dr. Mallory Shirk for accuracy.   [facility] Visit  Patient name: Abigail Wagner MRN [id number]  Date of birth: 24-Nov-1950  CC & HPI:  Abigail Wagner is a 63 y.o. female presenting today for intermittent RLQ pain for the past 3 weeks. She has had associated chills and 2 episodes of vomiting in the past 2 weeks. She rates her pain is a 5/10 and is worse at night. She states that taking a BC powder is the only thing that relieves the pain. Pt states certain movements worsens her pain.  Pt also reports feeling fatigued.   ROS:  +RLQ pain, vomiting, chills, fatigue Otherwise negative  Pertinent History Reviewed:   Reviewed: Significant for  Medical         Past Medical History  Diagnosis Date  . IBS (irritable bowel syndrome)   . History of kidney stones   . Arthritis   . Genital herpes   . HTN (hypertension)   . Depression   . Current use of estrogen therapy   . Back pain   . Abscess, vagina 07/18/2013    Will rx septra and flagyl and F/U in 2 days                              Surgical Hx:    Past Surgical History  Procedure Laterality Date  . Cesarean section    . Partial hysterectomy      1981  . Breast enhancement surgery     Medications: Reviewed & Updated - see associated section                      Current outpatient prescriptions:ALTABAX 1 % ointment, Apply 1 application topically as needed. , Disp: , Rfl: ;  ascorbic acid (VITAMIN C) 500 MG tablet, Take 500 mg by mouth daily., Disp: , Rfl: ;  azithromycin (ZITHROMAX) 250 MG tablet, , Disp: , Rfl: ;  ciprofloxacin (CIPRO) 500 MG tablet, Take 1 tablet (500 mg total) by mouth 2 (two) times daily., Disp: 28 tablet, Rfl: 1 diclofenac (VOLTAREN) 75 MG EC tablet, Take 75 mg by mouth 2 (two) times daily. , Disp: , Rfl: ;  dicyclomine (BENTYL) 10 MG capsule, TAKE 1 CAPSULE (10 MG TOTAL) BY MOUTH 3  (THREE) TIMES DAILY., Disp: 90 capsule, Rfl: 3;  divalproex (DEPAKOTE) 250 MG DR tablet, Take 1 tablet (250 mg total) by mouth 1 day or 1 dose., Disp: 90 tablet, Rfl: 1;  escitalopram (LEXAPRO) 20 MG tablet, Take 1 tablet (20 mg total) by mouth daily., Disp: 90 tablet, Rfl: 1 estradiol (ESTRACE) 2 MG tablet, Take 1 tablet (2 mg total) by mouth daily., Disp: 90 tablet, Rfl: 11;  fluticasone (CUTIVATE) 0.05 % cream, Apply topically 2 (two) times daily., Disp: , Rfl: ;  folic acid (FOLVITE) 970 MCG tablet, Take 400 mcg by mouth daily., Disp: , Rfl: ;  hydrocortisone (ANUSOL-HC) 2.5 % rectal cream, Place 1 application rectally 2 (two) times daily., Disp: 30 g, Rfl: 0 lansoprazole (PREVACID) 15 MG capsule, Take 15 mg by mouth daily., Disp: , Rfl: ;  metoprolol tartrate (LOPRESSOR) 25 MG tablet, , Disp: , Rfl: ;  Multiple Vitamin (MULTIVITAMIN) tablet, Take 1 tablet by mouth daily., Disp: , Rfl: ;  nystatin-triamcinolone ointment (MYCOLOG), , Disp: ,  Rfl: ;  Potassium Gluconate 550 MG TABS, Take 550 mg by mouth once., Disp: , Rfl:  promethazine (PHENERGAN) 25 MG tablet, Take 1 tablet (25 mg total) by mouth every 6 (six) hours as needed for nausea., Disp: 30 tablet, Rfl: 1;  traMADol (ULTRAM) 50 MG tablet, Take 1 tablet (50 mg total) by mouth every 6 (six) hours as needed., Disp: 20 tablet, Rfl: 0;  valACYclovir (VALTREX) 1000 MG tablet, daily. , Disp: , Rfl:    Social History: Reviewed -  reports that she has been smoking Cigarettes.  She has a 20 pack-year smoking history. She has never used smokeless tobacco.  Objective Findings:  Vitals: Blood pressure 200/110, height _0  (1.651 m), weight 161 lb (73.029 kg).  Physical Examination: General appearance - alert, well appearing, and in no distress and oriented to person, place, and time Mental status - alert, oriented to person, place, and time, normal mood, behavior, speech, dress, motor activity, and thought processes Pelvic -  VULVA: normal appearing  vulva with no masses, tenderness or lesions, VAGINA: normal appearing vagina with normal color and discharge, no lesions, CERVIX: normal appearing cervix without discharge or lesions, no discharge noted,  UTERUS: surgically absent, vaginal cuff well healed,  ADNEXA: normal adnexa in size, nontender and no masses   Assessment & Plan:   A:  1. Chronic RLQ pain, non-gyn etiology suspected 2. Omental thickening on CT, without ascites, unknown significance P:  1. Check CMET and Ca125, ESR 2. Follow up labs results by phone

## 2014-04-15 NOTE — Telephone Encounter (Signed)
Will f/u labs by phone

## 2014-04-15 NOTE — Progress Notes (Signed)
Rescheduling of appt: Pt being seen due to concerns noted on CT done [facility] by [facility] MD. "omental thickening". Will order cmet, esr, ca 125, and  Have pt rescheduled til tomorrow.

## 2014-04-16 ENCOUNTER — Ambulatory Visit: Payer: 59 | Admitting: Obstetrics and Gynecology

## 2014-04-16 LAB — CA 125: CA 125: 19.2 U/mL (ref 0.0–30.2)

## 2014-04-16 LAB — COMPREHENSIVE METABOLIC PANEL
ALK PHOS: 76 U/L (ref 39–117)
ALT: 24 U/L (ref 0–35)
AST: 36 U/L (ref 0–37)
Albumin: 4.2 g/dL (ref 3.5–5.2)
BILIRUBIN TOTAL: 0.4 mg/dL (ref 0.2–1.2)
BUN: 11 mg/dL (ref 6–23)
CO2: 26 mEq/L (ref 19–32)
Calcium: 9.3 mg/dL (ref 8.4–10.5)
Chloride: 100 mEq/L (ref 96–112)
Creat: 0.54 mg/dL (ref 0.50–1.10)
Glucose, Bld: 96 mg/dL (ref 70–99)
Potassium: 4.5 mEq/L (ref 3.5–5.3)
Sodium: 139 mEq/L (ref 135–145)
Total Protein: 7.2 g/dL (ref 6.0–8.3)

## 2014-04-16 LAB — SEDIMENTATION RATE: Sed Rate: 1 mm/hr (ref 0–22)

## 2014-05-08 ENCOUNTER — Encounter (INDEPENDENT_AMBULATORY_CARE_PROVIDER_SITE_OTHER): Payer: Self-pay | Admitting: General Surgery

## 2014-05-08 ENCOUNTER — Ambulatory Visit (INDEPENDENT_AMBULATORY_CARE_PROVIDER_SITE_OTHER): Payer: 59 | Admitting: General Surgery

## 2014-05-08 VITALS — BP 126/82 | HR 71 | Temp 97.0°F | Resp 16 | Ht 65.0 in | Wt 162.4 lb

## 2014-05-08 DIAGNOSIS — F17200 Nicotine dependence, unspecified, uncomplicated: Secondary | ICD-10-CM

## 2014-05-08 DIAGNOSIS — K589 Irritable bowel syndrome without diarrhea: Secondary | ICD-10-CM

## 2014-05-08 DIAGNOSIS — F411 Generalized anxiety disorder: Secondary | ICD-10-CM

## 2014-05-08 DIAGNOSIS — F1020 Alcohol dependence, uncomplicated: Secondary | ICD-10-CM

## 2014-05-08 DIAGNOSIS — I10 Essential (primary) hypertension: Secondary | ICD-10-CM

## 2014-05-08 DIAGNOSIS — G8929 Other chronic pain: Secondary | ICD-10-CM

## 2014-05-08 DIAGNOSIS — R1031 Right lower quadrant pain: Secondary | ICD-10-CM

## 2014-05-08 NOTE — Patient Instructions (Signed)
The cause of your abdominal pain is not clear. Since this is recent onset and you have some abnormal findings on your CT, you have been advised to undergo laparoscopy and biopsies as indicated, possible appendectomy to get a definitive tissue diagnosis.  It is possible that this is completely benign and is related to scar tissue or your irritable bowel syndrome.  It is possible that this is some type of malignancy. All of your tests are not definitive.  You will be scheduled for diagnostic laparoscopy with biopsies, possible appendectomy, possible open laparotomy in the near future.

## 2014-05-08 NOTE — Progress Notes (Signed)
Patient ID: Abigail Wagner, female   DOB: Aug 17, 1951, 63 y.o.   MRN: [id number]  No chief complaint on file.   HPI Abigail Wagner is a 63 y.o. female.  She is referred by Dr. Audree Camel in [place]  for consideration of diagnostic laparoscopy with biopsies because of right-sided abdominal pain and CT scan worrisome for omental carcinomatosis. Dr. Ethlyn Gallery in [place] is her PCP.     Dr. Mallory Shirk in [place] is her gynecologist.    She is followed intermittently by Dr. [name] from behavioral health.  Patient is here today with her husband who is with her throughout the encounter. She is in no distress today. She states that she began having intermittent right lower quadrant pain about 2 months ago but it is sometimes radiates to the umbilicus. She's had a couple of episodes of vomiting but she was not having pain at that time. She says the pain is bothers her more in the morning, is a 5/10 in intensity. It comes and goes. She has irritable bowel syndrome but the pattern of her stools has not changed. She has no trouble swallowing or digesting her food. No abdominal distention. Does not feel a lump or a mass anywhere. Her weight has been stable.  She saw her PCP, Who ordered a CT scan. This was read by Chauncey Cruel. I have also reviewed this with Dr. Conchita Paris. There is a little bit of omental thickening, linear stranding and linear calcifications mostly under the rectus fascia. A little bit of calcification at the lower pole of the spleen. There are some chronic ovarian calcifications without mass and calcifications have increased somewhat since 2010.   Interpretation of the CT scan it is difficult. This could be occult carcinomatosis or could be nothing more than fat necrosis and scar tissue from a radiographic standpoint. There is no ascites.    I informally reviewed this with Dr. Everitt Amber, GYN oncologist, who states that her index of suspicion for ovarian malignancy is relatively low, at  least based on the history and imaging findings to date.She has seen Dr. Mallory Shirk. His pelvic exam is unremarkable. Abdominal and transvaginal ultrasound showed no ovarian mass. CA 125 is normal. CEA is normal. CEA 19-9 is normal.  Comorbidities include hypertension, vaginal hysterectomy 1981, cesarean section. Bilateral breast augmentation.   Laparoscopic tubal,    irritable bowel syndrome, ongoing tobacco abuse one half pack per day, ongoing alcohol use, 3 mixed drinks a day. History of anxiety and depression with 3 inpatient hospitalizations or behavioral health, last in 2010.  Marland Kitchen She is on Depakote and Lexapro. Last colonoscopy 2007 was normal.Gets annual mammography.  Family history is negative for any malignancy. Stroke and coronary artery disease in the family. HPI  Past Medical History  Diagnosis Date  . IBS (irritable bowel syndrome)   . History of kidney stones   . Arthritis   . Genital herpes   . HTN (hypertension)   . Depression   . Current use of estrogen therapy   . Back pain   . Abscess, vagina 07/18/2013    Will rx septra and flagyl and F/U in 2 days    Past Surgical History  Procedure Laterality Date  . Cesarean section    . Partial hysterectomy      1981  . Breast enhancement surgery      Family History  Problem Relation Age of Onset  . Bipolar disorder Mother   . Anxiety disorder Mother   .  Dementia Mother   . Alcohol abuse Paternal Uncle   . Bipolar disorder Maternal Grandmother   . Dementia Maternal Grandmother   . ADD / ADHD Neg Hx   . Drug abuse Neg Hx   . Depression Neg Hx   . OCD Neg Hx   . Paranoid behavior Neg Hx   . Schizophrenia Neg Hx   . Seizures Neg Hx   . Sexual abuse Neg Hx   . Physical abuse Neg Hx   . Alcohol abuse Paternal Uncle   . Stroke Brother     Social History History  Substance Use Topics  . Smoking status: Current Every Day Smoker -- 0.50 packs/day for 40 years    Types: Cigarettes  . Smokeless tobacco: Never Used   . Alcohol Use: Yes     Comment: occassional    No Known Allergies  Current Outpatient Prescriptions  Medication Sig Dispense Refill  . ALTABAX 1 % ointment Apply 1 application topically as needed.       Marland Kitchen ascorbic acid (VITAMIN C) 500 MG tablet Take 500 mg by mouth daily.      Marland Kitchen azithromycin (ZITHROMAX) 250 MG tablet       . ciprofloxacin (CIPRO) 500 MG tablet Take 1 tablet (500 mg total) by mouth 2 (two) times daily.  28 tablet  1  . diclofenac (VOLTAREN) 75 MG EC tablet Take 75 mg by mouth 2 (two) times daily.       Marland Kitchen dicyclomine (BENTYL) 10 MG capsule TAKE 1 CAPSULE (10 MG TOTAL) BY MOUTH 3 (THREE) TIMES DAILY.  90 capsule  3  . divalproex (DEPAKOTE) 250 MG DR tablet Take 1 tablet (250 mg total) by mouth 1 day or 1 dose.  90 tablet  1  . escitalopram (LEXAPRO) 20 MG tablet Take 1 tablet (20 mg total) by mouth daily.  90 tablet  1  . estradiol (ESTRACE) 2 MG tablet Take 1 tablet (2 mg total) by mouth daily.  90 tablet  11  . fluticasone (CUTIVATE) 0.05 % cream Apply topically 2 (two) times daily.      . folic acid (FOLVITE) 782 MCG tablet Take 400 mcg by mouth daily.      . hydrocortisone (ANUSOL-HC) 2.5 % rectal cream Place 1 application rectally 2 (two) times daily.  30 g  0  . lansoprazole (PREVACID) 15 MG capsule Take 15 mg by mouth daily.      . metoprolol tartrate (LOPRESSOR) 25 MG tablet       . Multiple Vitamin (MULTIVITAMIN) tablet Take 1 tablet by mouth daily.      Marland Kitchen nystatin-triamcinolone ointment (MYCOLOG)       . Potassium Gluconate 550 MG TABS Take 550 mg by mouth once.      . promethazine (PHENERGAN) 25 MG tablet Take 1 tablet (25 mg total) by mouth every 6 (six) hours as needed for nausea.  30 tablet  1  . traMADol (ULTRAM) 50 MG tablet Take 1 tablet (50 mg total) by mouth every 6 (six) hours as needed.  20 tablet  0  . valACYclovir (VALTREX) 1000 MG tablet daily.        No current facility-administered medications for this visit.    Review of Systems Review of  Systems  Constitutional: Negative for fever, chills and unexpected weight change.  HENT: Negative for congestion, hearing loss, sore throat, trouble swallowing and voice change.   Eyes: Negative for visual disturbance.  Respiratory: Negative for cough and wheezing.   Cardiovascular: Negative  for chest pain, palpitations and leg swelling.  Gastrointestinal: Positive for vomiting, abdominal pain, diarrhea and constipation. Negative for nausea, blood in stool, abdominal distention, anal bleeding and rectal pain.  Genitourinary: Negative for hematuria, vaginal bleeding and difficulty urinating.  Musculoskeletal: Negative for arthralgias, back pain, gait problem and joint swelling.  Skin: Negative for rash and wound.  Neurological: Negative for seizures, syncope and headaches.  Hematological: Negative for adenopathy. Does not bruise/bleed easily.  Psychiatric/Behavioral: Negative for confusion. The patient is nervous/anxious.     Blood pressure 126/82, pulse 71, temperature 97 F (36.1 C), temperature source Temporal, resp. rate 16, height 5\' 5"  (1.651 m), weight 162 lb 6.4 oz (73.664 kg).  Physical Exam Physical Exam  Constitutional: She is oriented to person, place, and time. She appears well-developed and well-nourished. No distress.  HENT:  Head: Normocephalic and atraumatic.  Nose: Nose normal.  Mouth/Throat: No oropharyngeal exudate.  Eyes: Conjunctivae and EOM are normal. Pupils are equal, round, and reactive to light. Left eye exhibits no discharge. No scleral icterus.  Neck: Neck supple. No JVD present. No tracheal deviation present. No thyromegaly present.  Cardiovascular: Normal rate, regular rhythm, normal heart sounds and intact distal pulses.   No murmur heard. Pulmonary/Chest: Effort normal and breath sounds normal. No respiratory distress. She has no wheezes. She has no rales. She exhibits no tenderness.  Breast implants in place. Detailed breast exam not performed. No  axillary adenopathy.  Abdominal: Soft. Bowel sounds are normal. She exhibits no distension and no mass. There is no tenderness. There is no rebound and no guarding.  Abdomen not distended. No mass.Small scar below  umbilicus. Pfannenstiel incision. Subjectively tender at umbilicus but no hernia or masses. Liver and spleen not enlarged.  Genitourinary:  No inguinal adenopathy. Vaginal exam and vaginal cuff were normal in Dr. Kizzie Bane office recently. Not repeated.  Musculoskeletal: She exhibits no edema and no tenderness.  Lymphadenopathy:    She has no cervical adenopathy.  Neurological: She is alert and oriented to person, place, and time. She exhibits normal muscle tone. Coordination normal.  Skin: Skin is warm. No rash noted. She is not diaphoretic. No erythema. No pallor.  Psychiatric: Her behavior is normal. Judgment and thought content normal.  She is a little anxious and a little fearful but otherwise very appropriate with good insight.    Data Reviewed Bone conversation with Dr. Jacquiline Doe. Office notes from Dr. Glo Herring.  Behavioral health office notes. CT scan and ultrasound. CT scan discussed with Dr. Conchita Paris in radiology. Lab work reviewed.  Assessment    Right lower quadrant and umbilical pain of uncertain etiology.  Abnormal CT scan, suggesting thickened omentum and calcifications of the ovary. Differential diagnosis includes primary peritoneal carcinomatosis, metastatic carcinoma, benign disease such as that necrosis or scar tissue. I have a low index of suspicion for appendiceal cancer or pancreatic cancer since there structures looked normal and CT. Low index of suspicion for ovarian cancer due to the extensive workup to date.  I feel it is in her best interest to have diagnostic laparoscopy and hopefully biopsy to clarify the diagnosis. She and her husband agree. Image guided biopsy of the omentum is feasible, but does not appear to be as comprehensive and  approach.  Ongoing daily  alcohol use  Ongoing daily tobacco use  History of anxiety and depression, on Depakote and Lexapro  History cesarean section  History vaginal hysterectomy  History laparoscopic tubal ligation  History breast augmentation  Hypertension  Irritable bowel syndrome  Plan    Schedule for diagnostic laparoscopy, possible laparotomy, appendectomy if indicated, biopsies.  I have discussed indications, details, techniques, and numerous risks of the surgery with the patient and her husband. She's where the risk of bleeding, injury, incisional hernia, conversion to open laparotomy, injury to adjacent organs requiring repair, reoperation for delayed complications, and other unforeseen problems. She understands all these issues. All of her questions were answered. She agrees with this plan.        Edsel Petrin. Dalbert Batman, M.D., [facility] General and Minimally invasive Surgery Breast and Colorectal Surgery Office:   [phone] Pager:   [phone]  05/08/2014, 10:20 AM

## 2014-05-25 ENCOUNTER — Encounter (HOSPITAL_COMMUNITY): Payer: Self-pay | Admitting: Pharmacy Technician

## 2014-05-27 ENCOUNTER — Encounter (HOSPITAL_COMMUNITY)
Admission: RE | Admit: 2014-05-27 | Discharge: 2014-05-27 | Disposition: A | Discharge status: Home / Self Care | Payer: 59 | Source: Ambulatory Visit | Attending: General Surgery | Admitting: General Surgery

## 2014-05-27 ENCOUNTER — Ambulatory Visit (HOSPITAL_COMMUNITY)
Admission: RE | Admit: 2014-05-27 | Discharge: 2014-05-27 | Disposition: A | Discharge status: Home / Self Care | Payer: 59 | Source: Ambulatory Visit | Attending: Anesthesiology | Admitting: Anesthesiology

## 2014-05-27 ENCOUNTER — Encounter (HOSPITAL_COMMUNITY): Payer: Self-pay

## 2014-05-27 DIAGNOSIS — I10 Essential (primary) hypertension: Secondary | ICD-10-CM | POA: Insufficient documentation

## 2014-05-27 HISTORY — DX: Gastro-esophageal reflux disease without esophagitis: K21.9

## 2014-05-27 HISTORY — DX: Simple chronic bronchitis: J41.0

## 2014-05-27 HISTORY — DX: Headache: R51

## 2014-05-27 LAB — URINALYSIS, ROUTINE W REFLEX MICROSCOPIC
BILIRUBIN URINE: NEGATIVE
Glucose, UA: NEGATIVE mg/dL
HGB URINE DIPSTICK: NEGATIVE
Ketones, ur: NEGATIVE mg/dL
Leukocytes, UA: NEGATIVE
Nitrite: NEGATIVE
Protein, ur: NEGATIVE mg/dL
SPECIFIC GRAVITY, URINE: 1.003 — AB (ref 1.005–1.030)
Urobilinogen, UA: 0.2 mg/dL (ref 0.0–1.0)
pH: 7 (ref 5.0–8.0)

## 2014-05-27 LAB — COMPREHENSIVE METABOLIC PANEL
ALBUMIN: 3.8 g/dL (ref 3.5–5.2)
ALK PHOS: 77 U/L (ref 39–117)
ALT: 24 U/L (ref 0–35)
AST: 35 U/L (ref 0–37)
Anion gap: 13 (ref 5–15)
BUN: 8 mg/dL (ref 6–23)
CO2: 25 mEq/L (ref 19–32)
Calcium: 9.2 mg/dL (ref 8.4–10.5)
Chloride: 101 mEq/L (ref 96–112)
Creatinine, Ser: 0.53 mg/dL (ref 0.50–1.10)
GFR calc Af Amer: >90 mL/min (ref >90)
GFR calc non Af Amer: >90 mL/min (ref >90)
Glucose, Bld: 94 mg/dL (ref 70–99)
Potassium: 4.3 mEq/L (ref 3.7–5.3)
SODIUM: 139 meq/L (ref 137–147)
TOTAL PROTEIN: 7.2 g/dL (ref 6.0–8.3)
Total Bilirubin: 0.3 mg/dL (ref 0.3–1.2)

## 2014-05-27 LAB — CBC WITH DIFFERENTIAL/PLATELET
BASOS PCT: 0 % (ref 0–1)
Basophils Absolute: 0 10*3/uL (ref 0.0–0.1)
EOS ABS: 0.1 10*3/uL (ref 0.0–0.7)
Eosinophils Relative: 1 % (ref 0–5)
HCT: 40 % (ref 36.0–46.0)
Hemoglobin: 13.7 g/dL (ref 12.0–15.0)
Lymphocytes Relative: 33 % (ref 12–46)
Lymphs Abs: 1.8 10*3/uL (ref 0.7–4.0)
MCH: 33.9 pg (ref 26.0–34.0)
MCHC: 34.3 g/dL (ref 30.0–36.0)
MCV: 99 fL (ref 78.0–100.0)
Monocytes Absolute: 0.4 10*3/uL (ref 0.1–1.0)
Monocytes Relative: 7 % (ref 3–12)
NEUTROS PCT: 59 % (ref 43–77)
Neutro Abs: 3.3 10*3/uL (ref 1.7–7.7)
PLATELETS: 235 10*3/uL (ref 150–400)
RBC: 4.04 MIL/uL (ref 3.87–5.11)
RDW: 13.1 % (ref 11.5–15.5)
WBC: 5.6 10*3/uL (ref 4.0–10.5)

## 2014-05-27 MED ORDER — CHLORHEXIDINE GLUCONATE 4 % EX LIQD: 1.0000 "application " | Freq: Once | CUTANEOUS | Status: DC

## 2014-05-27 NOTE — Pre-Procedure Instructions (Signed)
Abigail Wagner  05/27/2014   Your procedure is scheduled on:  Tues, Sept 8 @ 10:00 AM  Report to [facility]  at 8:00 AM.  Call this number if you have problems the morning of surgery: [phone]   Remember:   Do not eat food or drink liquids after midnight.   Take these medicines the morning of surgery with A SIP OF WATER: Bentyl(Dicyclomine),Depakote(Divalproex),Lexapro(Escitalopram),Prevacid(Lansoprazole),Metoprolol(Lopressor),and Valacyclovir(Valtrex)              Stop taking your Diclofenac and Vitamins. No Goody's,BC's,Aleve,Aspirin,Ibuprofen,Fish Oil,or any Herbal Medications   Do not wear jewelry, make-up or nail polish.  Do not wear lotions, powders, or perfumes. You may wear deodorant.  Do not shave 48 hours prior to surgery.   Do not bring valuables to the hospital.  [facility] is not responsible                  for any belongings or valuables.               Contacts, dentures or bridgework may not be worn into surgery.  Leave suitcase in the car. After surgery it may be brought to your room.  For patients admitted to the hospital, discharge time is determined by your                treatment team.               Patients discharged the day of surgery will not be allowed to drive  home.    Special Instructions:  Abigail Wagner - Preparing for Surgery  Before surgery, you can play an important role.  Because skin is not sterile, your skin needs to be as free of germs as possible.  You can reduce the number of germs on you skin by washing with CHG (chlorahexidine gluconate) soap before surgery.  CHG is an antiseptic cleaner which kills germs and bonds with the skin to continue killing germs even after washing.  Please DO NOT use if you have an allergy to CHG or antibacterial soaps.  If your skin becomes reddened/irritated stop using the CHG and inform your nurse when you arrive at Short Stay.  Do not shave (including legs and underarms) for at least 48 hours prior to  the first CHG shower.  You may shave your face.  Please follow these instructions carefully:   1.  Shower with CHG Soap the night before surgery and the                                morning of Surgery.  2.  If you choose to wash your hair, wash your hair first as usual with your       normal shampoo.  3.  After you shampoo, rinse your hair and body thoroughly to remove the                      Shampoo.  4.  Use CHG as you would any other liquid soap.  You can apply chg directly       to the skin and wash gently with scrungie or a clean washcloth.  5.  Apply the CHG Soap to your body ONLY FROM THE NECK DOWN.        Do not use on open wounds or open sores.  Avoid contact with your eyes,       ears,  mouth and genitals (private parts).  Wash genitals (private parts)       with your normal soap.  6.  Wash thoroughly, paying special attention to the area where your surgery        will be performed.  7.  Thoroughly rinse your body with warm water from the neck down.  8.  DO NOT shower/wash with your normal soap after using and rinsing off       the CHG Soap.  9.  Pat yourself dry with a clean towel.            10.  Wear clean pajamas.            11.  Place clean sheets on your bed the night of your first shower and do not        sleep with pets.  Day of Surgery  Do not apply any lotions/deoderants the morning of surgery.  Please wear clean clothes to the hospital/surgery center.     Please read over the following fact sheets that you were given: Pain Booklet, Coughing and Deep Breathing and Surgical Site Infection Prevention

## 2014-05-27 NOTE — Progress Notes (Signed)
Pt doesn't have a cardiologist  Denies ever having an echo/stress test/heart cath  Denies EKG or CXR in past yr  Medical Md is Dr.Junell Koberlein

## 2014-05-28 NOTE — H&P (Signed)
Abigail Wagner   MRN:  [id number]   Description: 63 year old female  Provider: Fanny Skates, MD  Department: [facility]         Diagnoses      Abdominal pain, chronic, right lower quadrant    -  Primary      ICD-9-CM: 789.03,  338.29 ICD-10-CM: R10.31,  G89.29      Generalized anxiety disorder          ICD-9-CM: 300.02 ICD-10-CM: F41.1      Tobacco use disorder          ICD-9-CM: 305.1 ICD-10-CM: Y69.4      Uncomplicated alcohol dependence          ICD-9-CM: 303.90 ICD-10-CM: F10.20      Irritable bowel syndrome          ICD-9-CM: 564.1 ICD-10-CM: K58.9      Essential hypertension, benign          ICD-9-CM: 401.1 ICD-10-CM: I10          Current Vitals Most recent update:      BP Pulse Temp(Src) Resp Ht Wt      126/82 71 97 F (36.1 C) (Temporal) 16 5\' 5"  (1.651 m) 162 lb 6.4 oz (73.664 kg)      BMI 27.02 kg/m2                  History and Physical   Fanny Skates, MD      Status: Signed            Patient ID: Abigail Wagner, female   DOB: Apr 03, 1951, 63 y.o.   MRN: [id number]          HPI Abigail Wagner is a 63 y.o. female.  She is referred by Dr. Audree Camel in [place]  for consideration of diagnostic laparoscopy with biopsies because of right-sided abdominal pain and CT scan worrisome for omental carcinomatosis. Dr. Ethlyn Gallery in [place] is her PCP.     Dr. Mallory Shirk in [place] is her gynecologist.    She is followed intermittently by Dr. [name] from behavioral health.   Patient is here today with her husband who is with her throughout the encounter. She is in no distress today. She states that she began having intermittent right lower quadrant pain about 2 months ago but it is sometimes radiates to the umbilicus. She's had a couple of episodes of vomiting but she was not having pain at that time. She says the pain is bothers her more in the morning, is a 5/10 in intensity. It comes and goes. She has irritable bowel syndrome but  the pattern of her stools has not changed. She has no trouble swallowing or digesting her food. No abdominal distention. Does not feel a lump or a mass anywhere. Her weight has been stable.   She saw her PCP, Who ordered a CT scan. This was read by Chauncey Cruel. I have also reviewed this with Dr. Conchita Paris. There is a little bit of omental thickening, linear stranding and linear calcifications mostly under the rectus fascia. A little bit of calcification at the lower pole of the spleen. There are some chronic ovarian calcifications without mass and calcifications have increased somewhat since 2010.   Interpretation of the CT scan it is difficult. This could be occult carcinomatosis or could be nothing more than fat necrosis and scar tissue from a radiographic standpoint. There is no ascites.    I informally reviewed  this with Dr. Everitt Amber, GYN oncologist, who states that her index of suspicion for ovarian malignancy is relatively low, at least based on the history and imaging findings to date.She has seen Dr. Mallory Shirk. His pelvic exam is unremarkable. Abdominal and transvaginal ultrasound showed no ovarian mass. CA 125 is normal. CEA is normal. CEA 19-9 is normal.   Comorbidities include hypertension, vaginal hysterectomy 1981, cesarean section. Bilateral breast augmentation.   Laparoscopic tubal,    irritable bowel syndrome, ongoing tobacco abuse one half pack per day, ongoing alcohol use, 3 mixed drinks a day. History of anxiety and depression with 3 inpatient hospitalizations or behavioral health, last in 2010.  Marland Kitchen She is on Depakote and Lexapro. Last colonoscopy 2007 was normal.Gets annual mammography.   Family history is negative for any malignancy. Stroke and coronary artery disease in the family.        Past Medical History   Diagnosis  Date   .  IBS (irritable bowel syndrome)     .  History of kidney stones     .  Arthritis     .  Genital herpes     .  HTN (hypertension)     .   Depression     .  Current use of estrogen therapy     .  Back pain     .  Abscess, vagina  07/18/2013       Will rx septra and flagyl and F/U in 2 days         Past Surgical History   Procedure  Laterality  Date   .  Cesarean section       .  Partial hysterectomy           1981   .  Breast enhancement surgery             Family History   Problem  Relation  Age of Onset   .  Bipolar disorder  Mother     .  Anxiety disorder  Mother     .  Dementia  Mother     .  Alcohol abuse  Paternal Uncle     .  Bipolar disorder  Maternal Grandmother     .  Dementia  Maternal Grandmother     .  ADD / ADHD  Neg Hx     .  Drug abuse  Neg Hx     .  Depression  Neg Hx     .  OCD  Neg Hx     .  Paranoid behavior  Neg Hx     .  Schizophrenia  Neg Hx     .  Seizures  Neg Hx     .  Sexual abuse  Neg Hx     .  Physical abuse  Neg Hx     .  Alcohol abuse  Paternal Uncle     .  Stroke  Brother          Social History History   Substance Use Topics   .  Smoking status:  Current Every Day Smoker -- 0.50 packs/day for 40 years       Types:  Cigarettes   .  Smokeless tobacco:  Never Used   .  Alcohol Use:  Yes         Comment: occassional         No Known Allergies    Current Outpatient Prescriptions   Medication  Sig  Dispense  Refill   .  ALTABAX 1 % ointment  Apply 1 application topically as needed.          Marland Kitchen  ascorbic acid (VITAMIN C) 500 MG tablet  Take 500 mg by mouth daily.         Marland Kitchen  azithromycin (ZITHROMAX) 250 MG tablet           .  ciprofloxacin (CIPRO) 500 MG tablet  Take 1 tablet (500 mg total) by mouth 2 (two) times daily.   28 tablet   1   .  diclofenac (VOLTAREN) 75 MG EC tablet  Take 75 mg by mouth 2 (two) times daily.          Marland Kitchen  dicyclomine (BENTYL) 10 MG capsule  TAKE 1 CAPSULE (10 MG TOTAL) BY MOUTH 3 (THREE) TIMES DAILY.   90 capsule   3   .  divalproex (DEPAKOTE) 250 MG DR tablet  Take 1 tablet (250 mg total) by mouth 1 day or 1 dose.   90 tablet   1   .   escitalopram (LEXAPRO) 20 MG tablet  Take 1 tablet (20 mg total) by mouth daily.   90 tablet   1   .  estradiol (ESTRACE) 2 MG tablet  Take 1 tablet (2 mg total) by mouth daily.   90 tablet   11   .  fluticasone (CUTIVATE) 0.05 % cream  Apply topically 2 (two) times daily.         .  folic acid (FOLVITE) 527 MCG tablet  Take 400 mcg by mouth daily.         .  hydrocortisone (ANUSOL-HC) 2.5 % rectal cream  Place 1 application rectally 2 (two) times daily.   30 g   0   .  lansoprazole (PREVACID) 15 MG capsule  Take 15 mg by mouth daily.         .  metoprolol tartrate (LOPRESSOR) 25 MG tablet           .  Multiple Vitamin (MULTIVITAMIN) tablet  Take 1 tablet by mouth daily.         Marland Kitchen  nystatin-triamcinolone ointment (MYCOLOG)           .  Potassium Gluconate 550 MG TABS  Take 550 mg by mouth once.         .  promethazine (PHENERGAN) 25 MG tablet  Take 1 tablet (25 mg total) by mouth every 6 (six) hours as needed for nausea.   30 tablet   1   .  traMADol (ULTRAM) 50 MG tablet  Take 1 tablet (50 mg total) by mouth every 6 (six) hours as needed.   20 tablet   0   .  valACYclovir (VALTREX) 1000 MG tablet  daily.             Review of Systems  Constitutional: Negative for fever, chills and unexpected weight change.  HENT: Negative for congestion, hearing loss, sore throat, trouble swallowing and voice change.   Eyes: Negative for visual disturbance.  Respiratory: Negative for cough and wheezing.   Cardiovascular: Negative for chest pain, palpitations and leg swelling.  Gastrointestinal: Positive for vomiting, abdominal pain, diarrhea and constipation. Negative for nausea, blood in stool, abdominal distention, anal bleeding and rectal pain.  Genitourinary: Negative for hematuria, vaginal bleeding and difficulty urinating.  Musculoskeletal: Negative for arthralgias, back pain, gait problem and joint swelling.  Skin: Negative for rash and wound.  Neurological: Negative for seizures, syncope and  headaches.  Hematological: Negative for adenopathy. Does not bruise/bleed easily.  Psychiatric/Behavioral: Negative for confusion. The patient is nervous/anxious.       Blood pressure 126/82, pulse 71, temperature 97 F (36.1 C), temperature source Temporal, resp. rate 16, height 5\' 5"  (1.651 m), weight 162 lb 6.4 oz (73.664 kg).   Physical Exam   Constitutional: She is oriented to person, place, and time. She appears well-developed and well-nourished. No distress.  HENT:   Head: Normocephalic and atraumatic.   Nose: Nose normal.   Mouth/Throat: No oropharyngeal exudate.  Eyes: Conjunctivae and EOM are normal. Pupils are equal, round, and reactive to light. Left eye exhibits no discharge. No scleral icterus.  Neck: Neck supple. No JVD present. No tracheal deviation present. No thyromegaly present.  Cardiovascular: Normal rate, regular rhythm, normal heart sounds and intact distal pulses.    No murmur heard. Pulmonary/Chest: Effort normal and breath sounds normal. No respiratory distress. She has no wheezes. She has no rales. She exhibits no tenderness.  Breast implants in place. Detailed breast exam not performed. No axillary adenopathy.  Abdominal: Soft. Bowel sounds are normal. She exhibits no distension and no mass. There is no tenderness. There is no rebound and no guarding.  Abdomen not distended. No mass.Small scar below  umbilicus. Pfannenstiel incision. Subjectively tender at umbilicus but no hernia or masses. Liver and spleen not enlarged.  Genitourinary:  No inguinal adenopathy. Vaginal exam and vaginal cuff were normal in Dr. Kizzie Bane office recently. Not repeated.  Musculoskeletal: She exhibits no edema and no tenderness.  Lymphadenopathy:    She has no cervical adenopathy.  Neurological: She is alert and oriented to person, place, and time. She exhibits normal muscle tone. Coordination normal.  Skin: Skin is warm. No rash noted. She is not diaphoretic. No erythema. No  pallor.  Psychiatric: Her behavior is normal. Judgment and thought content normal.  She is a little anxious and a little fearful but otherwise very appropriate with good insight.      Data Reviewed Phone conversation with Dr. Jacquiline Doe. Office notes from Dr. Glo Herring.  Behavioral health office notes. CT scan and ultrasound. CT scan discussed with Dr. Conchita Paris in radiology. Lab work reviewed.   Assessment     Right lower quadrant and umbilical pain of uncertain etiology.   Abnormal CT scan, suggesting thickened omentum and calcifications of the ovary. Differential diagnosis includes primary peritoneal carcinomatosis, metastatic carcinoma, benign disease such as fat necrosis or scar tissue. I have a low index of suspicion for appendiceal cancer or pancreatic cancer since there structures looked normal on CT. Low index of suspicion for ovarian cancer due to the extensive workup to date.  Ongoing daily  alcohol use   Ongoing daily tobacco use   History of anxiety and depression, on Depakote and Lexapro   History cesarean section   History vaginal hysterectomy   History laparoscopic tubal ligation   History breast augmentation   Hypertension   Irritable bowel syndrome      Plan   I feel it is in her best interest to have diagnostic laparoscopy and hopefully biopsy to clarify the diagnosis. She and her husband agree. Image guided biopsy of the omentum is feasible, but does not appear to be as comprehensive and approach Schedule for diagnostic laparoscopy, possible laparotomy, appendectomy if indicated, biopsies.   I have discussed indications, details, techniques, and numerous risks of the surgery with the patient and her husband. She's where the risk of bleeding, injury, incisional hernia, conversion to  open laparotomy, injury to adjacent organs requiring repair, reoperation for delayed complications, and other unforeseen problems. She understands all these  issues. All of her questions were answered. She agrees with this plan.        Edsel Petrin. Dalbert Batman, M.D., [facility] General and Minimally invasive Surgery Breast and Colorectal Surgery Office:   [phone] Pager:   [phone]

## 2014-06-01 MED ORDER — CEFAZOLIN SODIUM-DEXTROSE 2-3 GM-% IV SOLR
2.0000 g | INTRAVENOUS | Status: AC
  Administered 2014-06-02: 2 g via INTRAVENOUS
  Filled 2014-06-01: qty 50

## 2014-06-02 ENCOUNTER — Encounter (HOSPITAL_COMMUNITY)
Admission: RE | Disposition: A | Discharge status: Home / Self Care | Payer: Self-pay | Source: Ambulatory Visit | Attending: General Surgery

## 2014-06-02 ENCOUNTER — Encounter (HOSPITAL_COMMUNITY): Payer: 59 | Admitting: Certified Registered Nurse Anesthetist

## 2014-06-02 ENCOUNTER — Ambulatory Visit (HOSPITAL_COMMUNITY)
Admission: RE | Admit: 2014-06-02 | Discharge: 2014-06-02 | Disposition: A | Discharge status: Home / Self Care | Payer: 59 | Source: Ambulatory Visit | Attending: General Surgery | Admitting: General Surgery

## 2014-06-02 ENCOUNTER — Ambulatory Visit (HOSPITAL_COMMUNITY): Payer: 59 | Admitting: Certified Registered Nurse Anesthetist

## 2014-06-02 ENCOUNTER — Encounter (HOSPITAL_COMMUNITY): Payer: Self-pay | Admitting: *Deleted

## 2014-06-02 DIAGNOSIS — F411 Generalized anxiety disorder: Secondary | ICD-10-CM | POA: Insufficient documentation

## 2014-06-02 DIAGNOSIS — K589 Irritable bowel syndrome without diarrhea: Secondary | ICD-10-CM | POA: Diagnosis not present

## 2014-06-02 DIAGNOSIS — F17200 Nicotine dependence, unspecified, uncomplicated: Secondary | ICD-10-CM | POA: Insufficient documentation

## 2014-06-02 DIAGNOSIS — G8929 Other chronic pain: Secondary | ICD-10-CM | POA: Diagnosis present

## 2014-06-02 DIAGNOSIS — R1031 Right lower quadrant pain: Secondary | ICD-10-CM | POA: Diagnosis present

## 2014-06-02 DIAGNOSIS — C481 Malignant neoplasm of specified parts of peritoneum: Secondary | ICD-10-CM | POA: Diagnosis not present

## 2014-06-02 DIAGNOSIS — I10 Essential (primary) hypertension: Secondary | ICD-10-CM | POA: Diagnosis not present

## 2014-06-02 DIAGNOSIS — Z9079 Acquired absence of other genital organ(s): Secondary | ICD-10-CM | POA: Diagnosis not present

## 2014-06-02 DIAGNOSIS — F1020 Alcohol dependence, uncomplicated: Secondary | ICD-10-CM | POA: Insufficient documentation

## 2014-06-02 HISTORY — PX: LAPAROSCOPY: SHX197

## 2014-06-02 SURGERY — LAPAROSCOPY, DIAGNOSTIC
Anesthesia: General | Site: Abdomen

## 2014-06-02 MED ORDER — ONDANSETRON HCL 4 MG/2ML IJ SOLN
INTRAMUSCULAR | Status: DC | PRN
  Administered 2014-06-02: 4 mg via INTRAVENOUS

## 2014-06-02 MED ORDER — HYDROMORPHONE HCL PF 1 MG/ML IJ SOLN
0.2500 mg | INTRAMUSCULAR | Status: DC | PRN
  Administered 2014-06-02 (×2): 0.5 mg via INTRAVENOUS

## 2014-06-02 MED ORDER — FENTANYL CITRATE 0.05 MG/ML IJ SOLN
INTRAMUSCULAR | Status: DC | PRN
  Administered 2014-06-02 (×2): 100 ug via INTRAVENOUS

## 2014-06-02 MED ORDER — HYDROCODONE-ACETAMINOPHEN 5-325 MG PO TABS: 1.0000 | ORAL_TABLET | Freq: Four times a day (QID) | ORAL | Status: DC | PRN

## 2014-06-02 MED ORDER — LACTATED RINGERS IV SOLN
INTRAVENOUS | Status: DC
  Administered 2014-06-02 (×2): via INTRAVENOUS

## 2014-06-02 MED ORDER — HYDROMORPHONE HCL PF 1 MG/ML IJ SOLN
INTRAMUSCULAR | Status: DC
Start: 2014-06-02 — End: 2014-06-02
  Filled 2014-06-02: qty 1

## 2014-06-02 MED ORDER — OXYCODONE HCL 5 MG PO TABS
5.0000 mg | ORAL_TABLET | Freq: Once | ORAL | Status: AC | PRN
  Administered 2014-06-02: 5 mg via ORAL

## 2014-06-02 MED ORDER — NEOSTIGMINE METHYLSULFATE 10 MG/10ML IV SOLN
INTRAVENOUS | Status: DC | PRN
  Administered 2014-06-02: 4 mg via INTRAVENOUS

## 2014-06-02 MED ORDER — LIDOCAINE HCL (CARDIAC) 20 MG/ML IV SOLN
INTRAVENOUS | Status: DC | PRN
  Administered 2014-06-02: 80 mg via INTRAVENOUS

## 2014-06-02 MED ORDER — PHENYLEPHRINE HCL 10 MG/ML IJ SOLN
INTRAMUSCULAR | Status: DC | PRN
  Administered 2014-06-02 (×2): 40 ug via INTRAVENOUS

## 2014-06-02 MED ORDER — OXYCODONE HCL 5 MG/5ML PO SOLN: 5.0000 mg | Freq: Once | ORAL | Status: AC | PRN

## 2014-06-02 MED ORDER — OXYCODONE HCL 5 MG PO TABS
ORAL_TABLET | ORAL | Status: AC
  Filled 2014-06-02: qty 1

## 2014-06-02 MED ORDER — ROCURONIUM BROMIDE 100 MG/10ML IV SOLN
INTRAVENOUS | Status: DC | PRN
  Administered 2014-06-02: 40 mg via INTRAVENOUS

## 2014-06-02 MED ORDER — DEXAMETHASONE SODIUM PHOSPHATE 10 MG/ML IJ SOLN
INTRAMUSCULAR | Status: DC | PRN
  Administered 2014-06-02: 10 mg via INTRAVENOUS

## 2014-06-02 MED ORDER — PROPOFOL 10 MG/ML IV BOLUS
INTRAVENOUS | Status: DC | PRN
  Administered 2014-06-02: 160 mg via INTRAVENOUS

## 2014-06-02 MED ORDER — SODIUM CHLORIDE 0.9 % IR SOLN
Status: DC | PRN
  Administered 2014-06-02: 1000 mL

## 2014-06-02 MED ORDER — EPHEDRINE SULFATE 50 MG/ML IJ SOLN
INTRAMUSCULAR | Status: DC | PRN
  Administered 2014-06-02: 10 mg via INTRAVENOUS

## 2014-06-02 MED ORDER — PROMETHAZINE HCL 25 MG/ML IJ SOLN: 6.2500 mg | INTRAMUSCULAR | Status: DC | PRN

## 2014-06-02 MED ORDER — BUPIVACAINE-EPINEPHRINE 0.25% -1:200000 IJ SOLN
INTRAMUSCULAR | Status: DC | PRN
  Administered 2014-06-02: 7 mL

## 2014-06-02 MED ORDER — DIPHENHYDRAMINE HCL 50 MG/ML IJ SOLN
INTRAMUSCULAR | Status: DC | PRN
  Administered 2014-06-02: 12.5 mg via INTRAVENOUS

## 2014-06-02 MED ORDER — 0.9 % SODIUM CHLORIDE (POUR BTL) OPTIME
TOPICAL | Status: DC | PRN
  Administered 2014-06-02: 1000 mL

## 2014-06-02 MED ORDER — LACTATED RINGERS IV SOLN: INTRAVENOUS | Status: DC | PRN

## 2014-06-02 MED ORDER — GLYCOPYRROLATE 0.2 MG/ML IJ SOLN
INTRAMUSCULAR | Status: DC | PRN
  Administered 2014-06-02: 0.6 mg via INTRAVENOUS

## 2014-06-02 SURGICAL SUPPLY — 65 items
APPLIER CLIP ROT 10 11.4 M/L (STAPLE)
BLADE SURG ROTATE 9660 (MISCELLANEOUS) IMPLANT
CANISTER SUCTION 2500CC (MISCELLANEOUS) ×3 IMPLANT
CELLS DAT CNTRL 66122 CELL SVR (MISCELLANEOUS) IMPLANT
CHLORAPREP W/TINT 26ML (MISCELLANEOUS) ×3 IMPLANT
CLIP APPLIE ROT 10 11.4 M/L (STAPLE) IMPLANT
COVER MAYO STAND STRL (DRAPES) IMPLANT
COVER SURGICAL LIGHT HANDLE (MISCELLANEOUS) ×6 IMPLANT
DERMABOND ADVANCED (GAUZE/BANDAGES/DRESSINGS) ×1
DERMABOND ADVANCED .7 DNX12 (GAUZE/BANDAGES/DRESSINGS) ×2 IMPLANT
DRAPE LAPAROSCOPIC ABDOMINAL (DRAPES) ×3 IMPLANT
DRAPE PROXIMA HALF (DRAPES) ×3 IMPLANT
DRAPE UTILITY 15X26 W/TAPE STR (DRAPE) ×6 IMPLANT
DRAPE WARM FLUID 44X44 (DRAPE) ×3 IMPLANT
DRSG OPSITE POSTOP 4X10 (GAUZE/BANDAGES/DRESSINGS) IMPLANT
DRSG OPSITE POSTOP 4X8 (GAUZE/BANDAGES/DRESSINGS) IMPLANT
ELECT BLADE 6.5 EXT (BLADE) IMPLANT
ELECT CAUTERY BLADE 6.4 (BLADE) ×3 IMPLANT
ELECT REM PT RETURN 9FT ADLT (ELECTROSURGICAL) ×3
ELECTRODE REM PT RTRN 9FT ADLT (ELECTROSURGICAL) ×2 IMPLANT
GEL ULTRASOUND 20GR AQUASONIC (MISCELLANEOUS) IMPLANT
GLOVE BIO SURGEON STRL SZ 6.5 (GLOVE) ×3 IMPLANT
GLOVE BIOGEL PI IND STRL 6.5 (GLOVE) ×4 IMPLANT
GLOVE BIOGEL PI INDICATOR 6.5 (GLOVE) ×2
GLOVE EUDERMIC 7 POWDERFREE (GLOVE) ×3 IMPLANT
GOWN STRL REUS W/ TWL LRG LVL3 (GOWN DISPOSABLE) ×4 IMPLANT
GOWN STRL REUS W/ TWL XL LVL3 (GOWN DISPOSABLE) ×2 IMPLANT
GOWN STRL REUS W/TWL LRG LVL3 (GOWN DISPOSABLE) ×2
GOWN STRL REUS W/TWL XL LVL3 (GOWN DISPOSABLE) ×1
KIT BASIN OR (CUSTOM PROCEDURE TRAY) ×3 IMPLANT
KIT ROOM TURNOVER OR (KITS) ×3 IMPLANT
LIGASURE IMPACT 36 18CM CVD LR (INSTRUMENTS) IMPLANT
NS IRRIG 1000ML POUR BTL (IV SOLUTION) ×6 IMPLANT
PACK GENERAL/GYN (CUSTOM PROCEDURE TRAY) IMPLANT
PAD ARMBOARD 7.5X6 YLW CONV (MISCELLANEOUS) ×6 IMPLANT
PENCIL BUTTON HOLSTER BLD 10FT (ELECTRODE) ×6 IMPLANT
RTRCTR WOUND ALEXIS 18CM MED (MISCELLANEOUS)
SCALPEL HARMONIC ACE (MISCELLANEOUS) ×3 IMPLANT
SCISSORS LAP 5X35 DISP (ENDOMECHANICALS) ×3 IMPLANT
SET IRRIG TUBING LAPAROSCOPIC (IRRIGATION / IRRIGATOR) ×3 IMPLANT
SLEEVE ENDOPATH XCEL 5M (ENDOMECHANICALS) ×6 IMPLANT
SPECIMEN JAR LARGE (MISCELLANEOUS) ×3 IMPLANT
SPECIMEN JAR SMALL (MISCELLANEOUS) ×6 IMPLANT
SPONGE LAP 18X18 X RAY DECT (DISPOSABLE) IMPLANT
STAPLER VISISTAT 35W (STAPLE) ×3 IMPLANT
SUCTION POOLE TIP (SUCTIONS) IMPLANT
SUT PDS AB 1 TP1 96 (SUTURE) IMPLANT
SUT SILK 2 0 SH CR/8 (SUTURE) IMPLANT
SUT SILK 2 0 TIES 10X30 (SUTURE) IMPLANT
SUT SILK 3 0 SH CR/8 (SUTURE) IMPLANT
SUT SILK 3 0 TIES 10X30 (SUTURE) IMPLANT
SYR BULB IRRIGATION 50ML (SYRINGE) IMPLANT
SYS LAPSCP GELPORT 120MM (MISCELLANEOUS)
SYSTEM LAPSCP GELPORT 120MM (MISCELLANEOUS) IMPLANT
TOWEL OR 17X24 6PK STRL BLUE (TOWEL DISPOSABLE) ×3 IMPLANT
TOWEL OR 17X26 10 PK STRL BLUE (TOWEL DISPOSABLE) ×3 IMPLANT
TRAY FOLEY CATH 16FRSI W/METER (SET/KITS/TRAYS/PACK) ×3 IMPLANT
TRAY LAPAROSCOPIC (CUSTOM PROCEDURE TRAY) ×3 IMPLANT
TROCAR XCEL 12X100 BLDLESS (ENDOMECHANICALS) ×3 IMPLANT
TROCAR XCEL BLUNT TIP 100MML (ENDOMECHANICALS) IMPLANT
TROCAR XCEL NON-BLD 11X100MML (ENDOMECHANICALS) IMPLANT
TROCAR XCEL NON-BLD 5MMX100MML (ENDOMECHANICALS) ×3 IMPLANT
TUBE CONNECTING 12X1/4 (SUCTIONS) ×6 IMPLANT
TUBING FILTER THERMOFLATOR (ELECTROSURGICAL) ×3 IMPLANT
YANKAUER SUCT BULB TIP NO VENT (SUCTIONS) IMPLANT

## 2014-06-02 NOTE — Anesthesia Postprocedure Evaluation (Signed)
  Anesthesia Post-op Note  Patient: Abigail Wagner  Procedure(s) Performed: Procedure(s): DIAGNOSTIC LAPAROSCOPY, OMENTAL BIOPSY, RIGHT OVARY BIOPSY, LYSIS OF ADHESIONS (N/A)  Patient Location: PACU  Anesthesia Type:General  Level of Consciousness: awake and alert   Airway and Oxygen Therapy: Patient Spontanous Breathing  Post-op Pain: mild  Post-op Assessment: Post-op Vital signs reviewed  Post-op Vital Signs: stable  Last Vitals:  Filed Vitals:   06/02/14 1255  BP: 129/58  Pulse: 74  Temp:   Resp: 20    Complications: No apparent anesthesia complications

## 2014-06-02 NOTE — Op Note (Signed)
Patient Name:           Abigail Wagner   Date of Surgery:        06/02/2014  Note: This dictation was prepared with Dragon/digital dictation along with [facility] technology. Any transcriptional errors that result from this process are unintentional.   Pre op Diagnosis:      Abdominal pain of uncertain etiology Abnormal CT scan suggesting thickened omentum and calcifications of the ovary  Post op Diagnosis:    Same  Procedure:                 Diagnostic laparoscopy, lysis of adhesions requiring 30 minutes, biopsy of omental nodules, biopsy right ovary  Surgeon:                     Edsel Petrin. Dalbert Batman, M.D., FACS  Assistant:                      OR staff  Operative Indications:   Abigail Wagner is a 63 y.o. female. She is referred by Dr. Audree Camel in [place] for consideration of diagnostic laparoscopy with biopsies because of right-sided abdominal pain and CT scan worrisome for omental carcinomatosis. Dr. Ethlyn Gallery in [place] is her PCP. Dr. Mallory Shirk in [place] is her gynecologist. She is followed intermittently by Dr. [name] from behavioral health.   She states that she began having intermittent right lower quadrant pain about 2 months ago but it is sometimes radiates to the umbilicus. She's had a couple of episodes of vomiting but she was not having pain at that time. She says the pain is bothers her more in the morning, is a 5/10 in intensity. It comes and goes. She has irritable bowel syndrome but the pattern of her stools has not changed. She has no trouble swallowing or digesting her food. No abdominal distention. Does not feel a lump or a mass anywhere. Her weight has been stable.  She saw her PCP, Who ordered a CT scan. This was read by Chauncey Cruel. I have also reviewed this with Dr. Conchita Paris. There is a little bit of omental thickening, linear stranding and linear calcifications mostly under the rectus fascia. A little bit of calcification at the lower pole of the  spleen. There are some chronic ovarian calcifications without mass and calcifications have increased somewhat since 2010. Interpretation of the CT scan is difficult. This could be occult carcinomatosis or could be nothing more than fat necrosis and scar tissue from a radiographic standpoint. There is no ascites. I informally reviewed this with Dr. Everitt Amber, GYN oncologist, who states that her index of suspicion for ovarian malignancy is relatively low, at least based on the history and imaging findings to date.She has seen Dr. Mallory Shirk. His pelvic exam is unremarkable. Abdominal and transvaginal ultrasound showed no ovarian mass. CA 125 is normal. CEA is normal. CEA 19-9 is normal.  Comorbidities include hypertension, vaginal hysterectomy 1981, cesarean section. Bilateral breast augmentation. Laparoscopic tubal, irritable bowel syndrome, ongoing tobacco abuse one half pack per day, ongoing alcohol use, 3 mixed drinks a day. History of anxiety and depression with 3 inpatient hospitalizations or behavioral health, last in 2010. Marland Kitchen She is on Depakote and Lexapro. Last colonoscopy 2007 was normal.Gets annual mammography.  Family history is negative for any malignancy. Stroke and coronary artery disease in the family. She is brought to the operating room electively for diagnostic laparoscopy, biopsy is indicated   Operative Findings:  The omentum was extensively, chronically adhered to the anterior abdominal wall, mostly in the midline from just above the umbilicus down into the pelvis. There were a few whitish nodules on the omentum, especially as it was adherent to the abdominal wall, suggestive of fat necrosis. These were biopsied. Once the omentum was completely mobilized I was able to see the appendix which was normal. The terminal ileum was normal. The right colon and transverse colon were normal. The undersurface of the omentum was normal. The liver, gallbladder and stomach were normal. There was  no peritoneal nodules. The right ovary had some whitish firm nodularity which was biopsied. The left ovary could not be visualized due to chronic adhesions. I could not visualize the uterus, presumably it is very small. The sigmoid colon looked normal.  Procedure in Detail:          Following the induction of general endotracheal anesthesia an oral gastric tube and Foley catheter was placed. The abdomen was prepped and draped in a sterile fashion. Intravenous antibiotics were given. Surgical time out was performed. 0.5% Marcaine with epinephrine was used as local infiltration anesthetic.    A 5 mm optical trocar was placed in the left subcostal region.  Optical entry was uneventful. Pneumoperitoneum was created. The camera was inserted. There was no sign of any bleeding or intestinal injury. A 12 mm trocar was placed in the left mid abdomen, a 5 mm trocar in the left lower quadrant, and 5 mm trocar in the right upper abdomen just lateral to the midline.     Exploration was carried out. I spent a long time taking the omental adhesions down off the anterior abdominal wall and biopsied a couple of whitish areas. There was a little bit of bleeding but not much and this stopped spontaneously. Ultimately I completely mobilized the omentum off the abdominal wall and  was able to push it cephalad up to the liver so I could visualize the small bowel, terminal ileum, appendix and right colon, all of which looked normal. I carried the visualization to the pelvis and noted the right ovary had some whitish material on it, similarly, this looked like fat necrosis, possibly calcified. I took a small pinch biopsy of this as well. There were no peritoneal nodules. I washed out the pelvis and areas of dissection to make sure the bleeding had stopped it appeared to have stopped. I took a look in the left upper quadrant but could not see the spleen; presumably it was not enlarged. I felt that nothing further needed to be done.  Trochars were removed. There was no bleeding from the trocar sites. Pneumoperitoneum was released. Skin incision closed with subcuticular sutures of 4-0 Monocryl and Dermabond. The patient tolerated the procedure well was taken to PACU in stable condition. EBL 25 cc. Counts correct. Complications none.     Edsel Petrin. Dalbert Batman, M.D., FACS General and Minimally Invasive Surgery Breast and Colorectal Surgery  06/02/2014 10:50 AM

## 2014-06-02 NOTE — Anesthesia Preprocedure Evaluation (Signed)
Anesthesia Evaluation  Patient identified by MRN, date of birth, ID band Patient awake    Reviewed: Allergy & Precautions, H&P , NPO status , Patient's Chart, lab work & pertinent test results  History of Anesthesia Complications Negative for: history of anesthetic complications  Airway Mallampati: I  Neck ROM: Full    Dental  (+) Teeth Intact   Pulmonary          Cardiovascular hypertension, Rhythm:Regular Rate:Normal     Neuro/Psych    GI/Hepatic GERD-  ,  Endo/Other    Renal/GU      Musculoskeletal  (+) Arthritis -,   Abdominal   Peds  Hematology   Anesthesia Other Findings   Reproductive/Obstetrics                           Anesthesia Physical Anesthesia Plan  ASA: II  Anesthesia Plan: General   Post-op Pain Management:    Induction: Intravenous  Airway Management Planned: Oral ETT  Additional Equipment:   Intra-op Plan:   Post-operative Plan: Extubation in OR  Informed Consent: I have reviewed the patients History and Physical, chart, labs and discussed the procedure including the risks, benefits and alternatives for the proposed anesthesia with the patient or authorized representative who has indicated his/her understanding and acceptance.   Dental advisory given  Plan Discussed with: CRNA and Surgeon  Anesthesia Plan Comments:         Anesthesia Quick Evaluation

## 2014-06-02 NOTE — Discharge Instructions (Signed)
Drink lots of fluids. Follow a low-fat diet  Walk as much as possibl,e several times a day  You may shower, starting tomorrow  Take a laxative if you need to  We should be able to call the reports to you by Thursday afternoon         [facility] ______[facility] LAPAROSCOPIC SURGERY: POST OP INSTRUCTIONS Always review your discharge instruction sheet given to you by the facility where your surgery was performed. IF YOU HAVE DISABILITY OR FAMILY LEAVE FORMS, YOU MUST BRING THEM TO THE OFFICE FOR PROCESSING.   DO NOT GIVE THEM TO YOUR DOCTOR.  1. A prescription for pain medication may be given to you upon discharge.  Take your pain medication as prescribed, if needed.  If narcotic pain medicine is not needed, then you may take acetaminophen (Tylenol) or ibuprofen (Advil) as needed. 2. Take your usually prescribed medications unless otherwise directed. 3. If you need a refill on your pain medication, please contact your pharmacy.  They will contact our office to request authorization. Prescriptions will not be filled after 5pm or on week-ends. 4. You should follow a light diet the first few days after arrival home, such as soup and crackers, etc.  Be sure to include lots of fluids daily. 5. Most patients will experience some swelling and bruising in the area of the incisions.  Ice packs will help.  Swelling and bruising can take several days to resolve.  6. It is common to experience some constipation if taking pain medication after surgery.  Increasing fluid intake and taking a stool softener (such as Colace) will usually help or prevent this problem from occurring.  A mild laxative (Milk of Magnesia or Miralax) should be taken according to package instructions if there are no bowel movements after 48 hours. 7. Unless discharge instructions indicate otherwise, you may remove your bandages 24-48 hours after surgery, and you may shower at that time.  You may have steri-strips  (small skin tapes) in place directly over the incision.  These strips should be left on the skin for 7-10 days.  If your surgeon used skin glue on the incision, you may shower in 24 hours.  The glue will flake off over the next 2-3 weeks.  Any sutures or staples will be removed at the office during your follow-up visit. 8. ACTIVITIES:  You may resume regular (light) daily activities beginning the next day--such as daily self-care, walking, climbing stairs--gradually increasing activities as tolerated.  You may have sexual intercourse when it is comfortable.  Refrain from any heavy lifting or straining until approved by your doctor. a. You may drive when you are no longer taking prescription pain medication, you can comfortably wear a seatbelt, and you can safely maneuver your car and apply brakes. b. RETURN TO WORK:  __________________________________________________________ 9. You should see your doctor in the office for a follow-up appointment approximately 2-3 weeks after your surgery.  Make sure that you call for this appointment within a day or two after you arrive home to insure a convenient appointment time. 10. OTHER INSTRUCTIONS: __________________________________________________________________________________________________________________________ __________________________________________________________________________________________________________________________ WHEN TO CALL YOUR DOCTOR: 1. Fever over 101.0 2. Inability to urinate 3. Continued bleeding from incision. 4. Increased pain, redness, or drainage from the incision. 5. Increasing abdominal pain  The clinic staff is available to answer your questions during regular business hours.  Please dont hesitate to call and ask to speak to one of the nurses for clinical concerns.  If you have a  medical emergency, go to the nearest emergency room or call 911.  A surgeon from [facility] is always on call at the  hospital. [address] [place], [place], [place]  [address] ? [address], [place],    [address] [phone] ? [phone] ? FAX [phone] Web site: [email or web address]

## 2014-06-02 NOTE — Transfer of Care (Signed)
Immediate Anesthesia Transfer of Care Note  Patient: Abigail Wagner  Procedure(s) Performed: Procedure(s): DIAGNOSTIC LAPAROSCOPY, OMENTAL BIOPSY, RIGHT OVARY BIOPSY, LYSIS OF ADHESIONS (N/A)  Patient Location: PACU  Anesthesia Type:General  Level of Consciousness: awake, alert , oriented, patient cooperative and responds to stimulation  Airway & Oxygen Therapy: Patient Spontanous Breathing and Patient connected to nasal cannula oxygen  Post-op Assessment: Report given to PACU RN, Post -op Vital signs reviewed and stable and Patient moving all extremities X 4  Post vital signs: Reviewed and stable  Complications: No apparent anesthesia complications

## 2014-06-02 NOTE — Interval H&P Note (Signed)
History and Physical Interval Note:  06/02/2014 9:09 AM  Abigail Wagner  has presented today for surgery, with the diagnosis of Abdominal Pain  The various methods of treatment have been discussed with the patient and family. After consideration of risks, benefits and other options for treatment, the patient has consented to  Procedure(s): LAPAROSCOPY WITH BIOPSIES POSS LAPAROTOMY (N/A) EXPLORATORY LAPAROTOMY (N/A) as a surgical intervention .  The patient's history has been reviewed, patient examined today, no change in status, stable for surgery.  I have reviewed the patient's chart and labs.  Questions were answered to the patient's satisfaction.     Adin Hector

## 2014-06-04 ENCOUNTER — Encounter (HOSPITAL_COMMUNITY): Payer: Self-pay | Admitting: General Surgery

## 2014-06-05 ENCOUNTER — Telehealth (INDEPENDENT_AMBULATORY_CARE_PROVIDER_SITE_OTHER): Payer: Self-pay | Admitting: General Surgery

## 2014-06-05 NOTE — Telephone Encounter (Signed)
Surgical pathology report shows serous carcinoma with abundant psammoma bodies in both omental biopsies. A small fragments from the right ovary showed atypical papillary proliferation was psammoma bodies consistent with borderline tumor. I discussed this with Dr. Casimer Bilis in pathology. He feels that this is consistent either with primary adnexal cancer or a primary peritoneal source.  I called the patient. She is doing well postop. I discussed the pathology report with her. I told her that the next step was probably referral to Surgical and/or GYN oncology [facility] or [facility] and that she would need another operation and possibly intraperitoneal chemotherapy. She wrote all this down and she seems to understand. I discussed this with Dr. Jacquiline Doe. He is going to call the patient and arrange the referral. She is going to follow up with me in the office in one to 2 weeks for a postop check.  Edsel Petrin. Dalbert Batman, M.D., [facility] General and Minimally invasive Surgery Breast and Colorectal Surgery Office:   [phone] Pager:   [phone]

## 2014-06-05 NOTE — Telephone Encounter (Signed)
Called to discuss pathology. LMOM to call me back. Also discussed with Dr. Jacquiline Doe.  hmi

## 2014-06-18 DIAGNOSIS — C762 Malignant neoplasm of abdomen: Secondary | ICD-10-CM | POA: Insufficient documentation

## 2014-06-30 ENCOUNTER — Ambulatory Visit (HOSPITAL_COMMUNITY): Payer: Self-pay | Admitting: Psychiatry

## 2014-07-09 ENCOUNTER — Telehealth: Payer: Self-pay | Admitting: *Deleted

## 2014-07-09 NOTE — Telephone Encounter (Signed)
Notified Abigail Wagner of appointment with Dr. Alycia Rossetti on October 22 @ 11:00. Abigail Wagner agreed with appointment time and date

## 2014-07-16 ENCOUNTER — Ambulatory Visit: Payer: 59 | Attending: Gynecologic Oncology | Admitting: Gynecologic Oncology

## 2014-07-16 ENCOUNTER — Encounter: Payer: Self-pay | Admitting: Gynecologic Oncology

## 2014-07-16 VITALS — BP 133/63 | HR 82 | Temp 98.4°F | Resp 16 | Ht 65.0 in | Wt 153.3 lb

## 2014-07-16 DIAGNOSIS — Z79890 Hormone replacement therapy (postmenopausal): Secondary | ICD-10-CM | POA: Diagnosis not present

## 2014-07-16 DIAGNOSIS — F17210 Nicotine dependence, cigarettes, uncomplicated: Secondary | ICD-10-CM | POA: Diagnosis not present

## 2014-07-16 DIAGNOSIS — K589 Irritable bowel syndrome without diarrhea: Secondary | ICD-10-CM | POA: Diagnosis not present

## 2014-07-16 DIAGNOSIS — C786 Secondary malignant neoplasm of retroperitoneum and peritoneum: Secondary | ICD-10-CM | POA: Diagnosis not present

## 2014-07-16 DIAGNOSIS — C569 Malignant neoplasm of unspecified ovary: Secondary | ICD-10-CM

## 2014-07-16 DIAGNOSIS — N801 Endometriosis of ovary: Secondary | ICD-10-CM | POA: Insufficient documentation

## 2014-07-16 DIAGNOSIS — C561 Malignant neoplasm of right ovary: Secondary | ICD-10-CM | POA: Diagnosis not present

## 2014-07-16 NOTE — Patient Instructions (Addendum)
I will need to see you back after her third cycle of chemotherapy.  Please call to schedule when closer to this time.   Appt with Dr. Jacquiline Doe on November 12 at 10 am.

## 2014-07-16 NOTE — Progress Notes (Signed)
Consult Note: Gyn-Onc  Abigail Wagner 63 y.o. female  CC:  Chief Complaint  Patient presents with  . Routine Post Op    HPI: History of Present Illness:  Abigail Wagner is a 63 y.o. woman who presented with new onset of abdominal pain; however, attributed it to her IBS. Eventually, she underwent a workup including a CT scan. The scan showed omental carcinomatosis. She was referred to a surgeon who performed an omental biopsy and ovarian biopsy. Omental biopsy came back as serous carcinoma. Ovarian biopsy came back as serous carcinoma consistent with at least borderline tumor.   Interval History:  Preop Labs and Imaging: CT A/P (7/14) Omentum with nodularity and calcifications Calcifications seen alone spleen Ovaries complex and calcified  TVUS (7/17) Uterus surgically absent R ovary: 3.1 x 2.1 x 2.7 cm w 1.7 cm cyst L ovary: 2.8 x 1.7 x 2.0 cm w 1.6 cm cyst  Omental biopsy (9/8): Serous carcinoma Ovarian biopsy (9/8): Serous carcinoma consistent with at least serous borderline  Oncology Summary:    Ovarian cancer (RAF-HCC)    03/2013  Interval Scan(s)  Omentum with nodularity and calcifications Calcifications seen alone spleen Ovaries complex and calcified    06/29/2014  Initial Diagnosis  biopsy w LSC, Omental biopsy (9/8): Serous carcinoma Ovarian biopsy (9/8): Serous carcinoma consistent with at least serous borderline    07/03/2014  Surgery  Exploratory laparotomy, omentectomy, Bilateral salpingo-oophorectomy, inptraperitoneal PORT placement     Procedure on 07/03/14: Exploratory laparotomy, omentectomy, Bilateral salpingo-oophorectomy, inptraperitoneal PORT placement  Operative Findings: -Large omentum with small, sub-cm disease -Upper abdominal survey without nodularity, liver, diaphragm, large bowel and peritoneal surfaces above the level of the pelvis without any disease  -terminal ileum with 3 areas of small sub-centimeter nodules in the mesentary, removed via argon  beam -right ovary with minimal 24m nodularity on peritoneal surface, removed completely -left ovary with 3cm irregular mass adhered to sigmoid colon and side wall peritoneum  -Anterior and posterior cul-de-sac without nodularity or disease -sigmoid colon aheared to left ovarian mass; adherent surface with questionable small sub-cm disease, removed completely -Intraperitoneal port placed immediately above the right costal margin; it is attached to the fascia with three 4-0 nylon sutures.  -R0 resection at the end of the procedure  Pathology: A: Omentum, omentectomy  - Low grade papillary serous carcinoma with numerous psammoma bodies (psammocarcinoma) (eg. A3) - Tumor consists of multiple small tumor nodules measuring up to 3 mm in size  B: Adnexa, right, salpingo-oophorectomy  - Atrophic ovary with serous cystadenofibroma and well-differentiated/low grade papillary serous carcinoma with psammoma bodies (psammocarcinoma), consistent with primary ovarian origin - Tumor invades ovarian cortical stroma up to 5 mm in depth - Surface ovarian involvement is present - Fallopian tube with paratubal cyst and surface involvement by tumor (eg. B2, B3) - AJCC pathologic staging pT3a pNx pMx; FIGO stage grouping IIIA   C: Adnexa, left, salpingo-oophorectomy  - Atrophic ovary with serous cystadenofibromatous change with surface serous implants of tumor and focal endometriosis  - Fallopian tube with surface serous implants of tumor - Detached fragment of smooth muscle with serous implant  D: Nodule, colon, biopsy - Papillary serous implant, no definite invasion identified  Comment: The tumor is consistent with a primary ovarian origin. Primary peritoneal origin cannot be completely excluded. Clinical correlation is suggested.  Stage/Disposition: JTiffinie Caillieris a 63y.o. 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 Disposition to IV combination taxane/carboplatin  chemotherapy. Patient is eligible for ENGAGE  study if treated at [facility]. Recommend KRAS and ER testing on her tumor.  ER + KRAS pendng.  She comes in today with her husband to discuss pathology and  followup plans. She is overall feeling fairly well. She is a little tearful at times. She's eating small amounts but has normal bowel and bladder function. She is sleeping well. Her energy level is slowly increasing. She really has no significant complaints. We reviewed her pathology as well as the recommendations for chemotherapy. She and her husband are very comfortable with this.  Current Meds:  Outpatient Encounter Prescriptions as of 07/16/2014  Medication Sig  . enoxaparin (LOVENOX) 40 MG/0.4ML injection Inject 40 mg into the skin.  Marland Kitchen oxyCODONE-acetaminophen (PERCOCET/ROXICET) 5-325 MG per tablet Take 1-2 tablets every 4-6 hours as needed for pain  . promethazine (PHENERGAN) 25 MG tablet Take 1 tablet by mouth.  . ALTABAX 1 % ointment Apply 1 application topically as needed.   Marland Kitchen ascorbic acid (VITAMIN C) 500 MG tablet Take 500 mg by mouth daily.  . Aspirin-Salicylamide-Caffeine (BC HEADACHE POWDER PO) Take by mouth.  . Aspirin-Salicylamide-Caffeine (BC HEADACHE POWDER PO) Take 1 packet by mouth.  Marland Kitchen b complex vitamins tablet Take 1 tablet by mouth daily.  . diclofenac (VOLTAREN) 75 MG EC tablet Take 75 mg by mouth 2 (two) times daily.   Marland Kitchen dicyclomine (BENTYL) 10 MG capsule Take 10 mg by mouth 2 (two) times daily.  . diphenhydrAMINE (BENADRYL) 25 MG tablet Take 25 mg by mouth.  . divalproex (DEPAKOTE) 250 MG DR tablet Take 1 tablet (250 mg total) by mouth 1 day or 1 dose.  . escitalopram (LEXAPRO) 20 MG tablet Take 1 tablet (20 mg total) by mouth daily.  Marland Kitchen estradiol (ESTRACE) 2 MG tablet Take 1 tablet (2 mg total) by mouth daily.  . folic acid (FOLVITE) 161 MCG tablet Take 400 mcg by mouth daily.  Marland Kitchen HYDROcodone-acetaminophen (NORCO) 5-325 MG per tablet Take 1-2 tablets by mouth every 6 (six) hours  as needed for moderate pain or severe pain.  . hydrocortisone (ANUSOL-HC) 2.5 % rectal cream Place 1 application rectally daily as needed.  . lansoprazole (PREVACID) 15 MG capsule Take 15 mg by mouth daily.  . metoprolol tartrate (LOPRESSOR) 25 MG tablet Take 25 mg by mouth 2 (two) times daily.   . Multiple Vitamin (MULTIVITAMIN) tablet Take 1 tablet by mouth daily.  . Omega-3 Fatty Acids (FISH OIL) 1200 MG CAPS Take 1 capsule by mouth.  . RA KRILL OIL 500 MG CAPS Take by mouth.  . traMADol (ULTRAM) 50 MG tablet   . valACYclovir (VALTREX) 1000 MG tablet Take 1,000 mg by mouth daily.     Allergy: No Known Allergies  Social Hx:   History   Social History  . Marital Status: Married    Spouse Name: N/A    Number of Children: N/A  . Years of Education: N/A   Occupational History  . Not on file.   Social History Main Topics  . Smoking status: Current Every Day Smoker -- 0.50 packs/day for 40 years    Types: Cigarettes  . Smokeless tobacco: Never Used  . Alcohol Use: Yes     Comment: daily  . Drug Use: Yes    Special: Marijuana     Comment: last time a couple of days  . Sexual Activity: Yes    Birth Control/ Protection: Surgical   Other Topics Concern  . Not on file   Social History Narrative  . No narrative  on file    Past Surgical Hx:  Past Surgical History  Procedure Laterality Date  . Cesarean section    . Partial hysterectomy      1981  . Breast enhancement surgery    . Tubal ligation    . Knee arthroscopy Left   . Colonoscopy    . Laparoscopy N/A 06/02/2014    Procedure: DIAGNOSTIC LAPAROSCOPY, OMENTAL BIOPSY, RIGHT OVARY BIOPSY, LYSIS OF ADHESIONS;  Surgeon: Fanny Skates, MD;  Location: [place];  Service: General;  Laterality: N/A;    Past Medical Hx:  Past Medical History  Diagnosis Date  . IBS (irritable bowel syndrome)     takes Bentyl daily  . History of kidney stones   . Arthritis   . Genital herpes     takes Valtrex daily  . Current use of  estrogen therapy   . Back pain   . Abscess, vagina 07/18/2013    Will rx septra and flagyl and F/U in 2 days  . GERD (gastroesophageal reflux disease)     takes Prevacid daily  . HTN (hypertension)     takes Metoprolol daily  . Smokers' cough   . Headache(784.0)   . Joint pain   . History of kidney stones   . Depression     takes Lexapro daily as well as Depakote    Oncology Hx:   No history exists.    Family Hx:  Family History  Problem Relation Age of Onset  . Bipolar disorder Mother   . Anxiety disorder Mother   . Dementia Mother   . Alcohol abuse Paternal Uncle   . Bipolar disorder Maternal Grandmother   . Dementia Maternal Grandmother   . ADD / ADHD Neg Hx   . Drug abuse Neg Hx   . Depression Neg Hx   . OCD Neg Hx   . Paranoid behavior Neg Hx   . Schizophrenia Neg Hx   . Seizures Neg Hx   . Sexual abuse Neg Hx   . Physical abuse Neg Hx   . Alcohol abuse Paternal Uncle   . Stroke Brother     Vitals:  Blood pressure 133/63, pulse 82, temperature 98.4 F (36.9 C), temperature source Oral, resp. rate 16, height 5' 5" (1.651 m), weight 153 lb 4.8 oz (69.536 kg).  Physical Exam: Well-nourished well-developed female in no acute distress.  Abdomen: Well-healed vertical midline incision. Well healing right upper quadrant port. Abdomen is soft and nontender. There is no evidence of any incisional hernias.  Assessment/Plan: Abigail Wagner is a 63 y.o. 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 Disposition to IV combination taxane/carboplatin chemotherapy. Patient is eligible for ENGAGE study if treated at [facility]. Recommend KRAS and ER testing on her tumor.  ER + KRAS pendng.  We would recommend proceeding with 6 cycles of IV paclitaxel and carboplatin. I would like to see the patient after cycle #3 and then after cycle #6 with a CT scan. At that time I'll schedule her for her IP port removal. They have had no issues with fatty replaced  and IP port based on the outside pathology and are quite pleased that she is a low-grade tumor it is not require any intraperitoneal therapy. We have contacted Dr. Reynaldo Minium office and she has an appointment to see him on November 12 at 10:00 in the morning for discussion and initiation of chemotherapy.  She has a copy of her pathology report. I will make sure that her physicians we'll  have all the reports from today. Her questions were elicited in answer to their satisfaction  , A., MD 07/16/2014, 11:09 AM

## 2014-07-21 ENCOUNTER — Other Ambulatory Visit (HOSPITAL_COMMUNITY): Payer: Self-pay | Admitting: Psychiatry

## 2014-07-21 ENCOUNTER — Telehealth (HOSPITAL_COMMUNITY): Payer: Self-pay | Admitting: *Deleted

## 2014-07-21 DIAGNOSIS — F39 Unspecified mood [affective] disorder: Secondary | ICD-10-CM

## 2014-07-21 DIAGNOSIS — F331 Major depressive disorder, recurrent, moderate: Secondary | ICD-10-CM

## 2014-07-21 MED ORDER — DIVALPROEX SODIUM 250 MG PO DR TAB: 250.0000 mg | DELAYED_RELEASE_TABLET | Freq: Every day | ORAL | Status: DC

## 2014-07-21 NOTE — Telephone Encounter (Signed)
Pt calling requesting refills for her Depakote. Pt medication was refilled 12-29-13 with 90 tabs 1 refill.  Per pt she will run out of this medication by Friday July 24, 2014. Pt was last seen 12-29-13. Per pt she have an upcoming appt 08-05-14. Pt number is  [phone]

## 2014-07-21 NOTE — Telephone Encounter (Signed)
Pt is aware that Dr. Harrington Challenger sent rx to pharmacy and shows understanding.

## 2014-07-21 NOTE — Telephone Encounter (Signed)
Sent to pharmacy 

## 2014-07-27 ENCOUNTER — Encounter: Payer: Self-pay | Admitting: Gynecologic Oncology

## 2014-08-05 ENCOUNTER — Ambulatory Visit (INDEPENDENT_AMBULATORY_CARE_PROVIDER_SITE_OTHER): Payer: PRIVATE HEALTH INSURANCE | Admitting: Psychiatry

## 2014-08-05 ENCOUNTER — Encounter (HOSPITAL_COMMUNITY): Payer: Self-pay | Admitting: Psychiatry

## 2014-08-05 VITALS — BP 166/78 | HR 72 | Ht 65.0 in | Wt 152.2 lb

## 2014-08-05 DIAGNOSIS — F39 Unspecified mood [affective] disorder: Secondary | ICD-10-CM

## 2014-08-05 DIAGNOSIS — F331 Major depressive disorder, recurrent, moderate: Secondary | ICD-10-CM

## 2014-08-05 DIAGNOSIS — F329 Major depressive disorder, single episode, unspecified: Secondary | ICD-10-CM

## 2014-08-05 MED ORDER — DIVALPROEX SODIUM 250 MG PO DR TAB: 250.0000 mg | DELAYED_RELEASE_TABLET | Freq: Every day | ORAL | Status: DC

## 2014-08-05 MED ORDER — ESCITALOPRAM OXALATE 20 MG PO TABS: 20.0000 mg | ORAL_TABLET | Freq: Every day | ORAL | Status: DC
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
## 2014-08-10 ENCOUNTER — Other Ambulatory Visit (INDEPENDENT_AMBULATORY_CARE_PROVIDER_SITE_OTHER): Payer: Self-pay | Admitting: General Surgery

## 2014-08-11 ENCOUNTER — Encounter (HOSPITAL_BASED_OUTPATIENT_CLINIC_OR_DEPARTMENT_OTHER): Payer: Self-pay | Admitting: *Deleted

## 2014-08-11 NOTE — Progress Notes (Signed)
Will go to [facility] for labs -had [id number]-ekg 9/15

## 2014-08-12 DIAGNOSIS — C562 Malignant neoplasm of left ovary: Secondary | ICD-10-CM | POA: Insufficient documentation

## 2014-08-14 ENCOUNTER — Encounter (HOSPITAL_COMMUNITY)
Admission: RE | Admit: 2014-08-14 | Discharge: 2014-08-14 | Disposition: A | Discharge status: Home / Self Care | Payer: 59 | Source: Ambulatory Visit | Attending: General Surgery | Admitting: General Surgery

## 2014-08-14 ENCOUNTER — Other Ambulatory Visit: Payer: Self-pay | Admitting: Obstetrics and Gynecology

## 2014-08-14 DIAGNOSIS — K219 Gastro-esophageal reflux disease without esophagitis: Secondary | ICD-10-CM | POA: Diagnosis not present

## 2014-08-14 DIAGNOSIS — F329 Major depressive disorder, single episode, unspecified: Secondary | ICD-10-CM | POA: Diagnosis not present

## 2014-08-14 DIAGNOSIS — F17210 Nicotine dependence, cigarettes, uncomplicated: Secondary | ICD-10-CM | POA: Diagnosis not present

## 2014-08-14 DIAGNOSIS — F319 Bipolar disorder, unspecified: Secondary | ICD-10-CM | POA: Diagnosis not present

## 2014-08-14 DIAGNOSIS — C569 Malignant neoplasm of unspecified ovary: Secondary | ICD-10-CM | POA: Diagnosis not present

## 2014-08-14 DIAGNOSIS — K649 Unspecified hemorrhoids: Secondary | ICD-10-CM

## 2014-08-14 DIAGNOSIS — I10 Essential (primary) hypertension: Secondary | ICD-10-CM | POA: Diagnosis not present

## 2014-08-14 DIAGNOSIS — M1990 Unspecified osteoarthritis, unspecified site: Secondary | ICD-10-CM | POA: Diagnosis not present

## 2014-08-14 LAB — CBC WITH DIFFERENTIAL/PLATELET
BASOS PCT: 1 % (ref 0–1)
Basophils Absolute: 0.1 10*3/uL (ref 0.0–0.1)
EOS ABS: 0.3 10*3/uL (ref 0.0–0.7)
EOS PCT: 5 % (ref 0–5)
HCT: 39.9 % (ref 36.0–46.0)
HEMOGLOBIN: 13.5 g/dL (ref 12.0–15.0)
Lymphocytes Relative: 44 % (ref 12–46)
Lymphs Abs: 2.6 10*3/uL (ref 0.7–4.0)
MCH: 31.8 pg (ref 26.0–34.0)
MCHC: 33.8 g/dL (ref 30.0–36.0)
MCV: 94.1 fL (ref 78.0–100.0)
MONO ABS: 0.4 10*3/uL (ref 0.1–1.0)
MONOS PCT: 7 % (ref 3–12)
NEUTROS PCT: 43 % (ref 43–77)
Neutro Abs: 2.5 10*3/uL (ref 1.7–7.7)
Platelets: 307 10*3/uL (ref 150–400)
RBC: 4.24 MIL/uL (ref 3.87–5.11)
RDW: 13.7 % (ref 11.5–15.5)
WBC: 5.9 10*3/uL (ref 4.0–10.5)

## 2014-08-14 LAB — COMPREHENSIVE METABOLIC PANEL
ALBUMIN: 4.1 g/dL (ref 3.5–5.2)
ALT: 43 U/L — ABNORMAL HIGH (ref 0–35)
AST: 40 U/L — ABNORMAL HIGH (ref 0–37)
Alkaline Phosphatase: 120 U/L — ABNORMAL HIGH (ref 39–117)
Anion gap: 11 (ref 5–15)
BUN: 11 mg/dL (ref 6–23)
CALCIUM: 10.1 mg/dL (ref 8.4–10.5)
CO2: 28 mEq/L (ref 19–32)
CREATININE: 0.61 mg/dL (ref 0.50–1.10)
Chloride: 104 mEq/L (ref 96–112)
GFR calc non Af Amer: >90 mL/min (ref >90)
GLUCOSE: 106 mg/dL — AB (ref 70–99)
POTASSIUM: 4.9 meq/L (ref 3.7–5.3)
Sodium: 143 mEq/L (ref 137–147)
TOTAL PROTEIN: 7.5 g/dL (ref 6.0–8.3)
Total Bilirubin: 0.4 mg/dL (ref 0.3–1.2)

## 2014-08-15 NOTE — H&P (Signed)
  Cyril Loosen. Elnoria Howard  Location: [facility] Surgery Patient #: [id number] DOB: 14-Oct-1950 Married / Language: English / Race: White Female      History of Present Illness  Patient words: eval for pac removal.  The patient is a 63 year old female who presents with a complaint of ovarian cancer epithelial stage iiib. This patient was referred back to me by Dr.Darovsky in [place]. Marland Kitchen He states that he would like me to place a Port-A-Cath and remove the IP port. This patient underwent diagnostic laparoscopy, omental biopsy and ovarian biopsy by me.  This confirmed serous papillary ovarian cancer.      On July 04, 2014 she underwent laparotomy, omentectomy, bilateral salpingo-oophorectomy, and intraperitoneal port placement by Dr. Nancy Marus at [facility].       She was diagnosed with stage IIIB serous papillary ovarian carcinoma. Apparently she does not need intraperitoneal chemotherapy, but does need intravenous chemotherapy      She has done well since her surgery and feels pretty good, considering      Addendum Note I have discussed the indications, details, techniques, and numerous risk of Port-A-Cath insertion and intraperineal port removal. She is aware of the risk of bleeding, infection, malfunction of the catheter, abdominal wall hernia, lung puncture with pneumothorax, and other unforseen problems. She understands all of these issues. All of her questions were answered. She agrees with this plan.    Vitals   Weight: 152 lb Height: 65in Body Surface Area: 1.78 m Body Mass Index: 25.29 kg/m Temp.: 97.69F  Pulse: 91 (Regular)  BP: 150/70 (Sitting, Left Arm, Standard)    Physical Exam  General Note: Pleasant. Looks good. No distress. Husband is with her.   Head and Neck Note: Neck reveals no adenopathy or mass. Good range of motion. Clavicles normal without deformity.   Chest and Lung Exam Note: Clear to auscultation bilaterally   Cardiovascular Note:  regular rate and rhythm. No murmur. No ectopy.   Abdomen Note: Soft. Not distended. Port in right upper quadrant without infection. Lower midline incision well healed.     Assessment & Plan  EPITHELIAL OVARIAN CANCER, FIGO STAGE IIIB (183.0  C56.9) Current Plans  Schedule for Surgery Dr. Jacquiline Doe has requested that I insert a vascular Port-A-Cath and remove the intraperitoneal port. I will check with Dr. Ned Clines about this.  She states can be removed with direst cutdown. We have discussed the techniques and risks of this surgery. The surgery is scheduled for next Monday.   Edsel Petrin. Dalbert Batman, M.D., [facility] General and Minimally invasive Surgery Breast and Colorectal Surgery Office:   [phone] Pager:   [phone]

## 2014-08-17 ENCOUNTER — Ambulatory Visit (HOSPITAL_COMMUNITY): Payer: 59

## 2014-08-17 ENCOUNTER — Ambulatory Visit (HOSPITAL_BASED_OUTPATIENT_CLINIC_OR_DEPARTMENT_OTHER): Payer: 59 | Admitting: Anesthesiology

## 2014-08-17 ENCOUNTER — Ambulatory Visit (HOSPITAL_BASED_OUTPATIENT_CLINIC_OR_DEPARTMENT_OTHER)
Admission: RE | Admit: 2014-08-17 | Discharge: 2014-08-17 | Disposition: A | Discharge status: Home / Self Care | Payer: 59 | Source: Ambulatory Visit | Attending: General Surgery | Admitting: General Surgery

## 2014-08-17 ENCOUNTER — Encounter (HOSPITAL_BASED_OUTPATIENT_CLINIC_OR_DEPARTMENT_OTHER)
Admission: RE | Disposition: A | Discharge status: Home / Self Care | Payer: Self-pay | Source: Ambulatory Visit | Attending: General Surgery

## 2014-08-17 ENCOUNTER — Other Ambulatory Visit (INDEPENDENT_AMBULATORY_CARE_PROVIDER_SITE_OTHER): Payer: Self-pay | Admitting: General Surgery

## 2014-08-17 ENCOUNTER — Encounter (HOSPITAL_BASED_OUTPATIENT_CLINIC_OR_DEPARTMENT_OTHER): Payer: Self-pay | Admitting: *Deleted

## 2014-08-17 DIAGNOSIS — F329 Major depressive disorder, single episode, unspecified: Secondary | ICD-10-CM | POA: Insufficient documentation

## 2014-08-17 DIAGNOSIS — I10 Essential (primary) hypertension: Secondary | ICD-10-CM | POA: Insufficient documentation

## 2014-08-17 DIAGNOSIS — F319 Bipolar disorder, unspecified: Secondary | ICD-10-CM | POA: Insufficient documentation

## 2014-08-17 DIAGNOSIS — C569 Malignant neoplasm of unspecified ovary: Secondary | ICD-10-CM | POA: Insufficient documentation

## 2014-08-17 DIAGNOSIS — M1990 Unspecified osteoarthritis, unspecified site: Secondary | ICD-10-CM | POA: Insufficient documentation

## 2014-08-17 DIAGNOSIS — F17210 Nicotine dependence, cigarettes, uncomplicated: Secondary | ICD-10-CM | POA: Insufficient documentation

## 2014-08-17 DIAGNOSIS — K219 Gastro-esophageal reflux disease without esophagitis: Secondary | ICD-10-CM | POA: Insufficient documentation

## 2014-08-17 DIAGNOSIS — Z95828 Presence of other vascular implants and grafts: Secondary | ICD-10-CM

## 2014-08-17 HISTORY — PX: PORTACATH PLACEMENT: SHX2246

## 2014-08-17 HISTORY — PX: PORT-A-CATH REMOVAL: SHX5289

## 2014-08-17 SURGERY — INSERTION, TUNNELED CENTRAL VENOUS DEVICE, WITH PORT
Anesthesia: General | Site: Chest | Laterality: Right

## 2014-08-17 MED ORDER — PROMETHAZINE HCL 25 MG/ML IJ SOLN: 6.2500 mg | INTRAMUSCULAR | Status: DC | PRN

## 2014-08-17 MED ORDER — LIDOCAINE HCL (CARDIAC) 20 MG/ML IV SOLN
INTRAVENOUS | Status: DC | PRN
  Administered 2014-08-17: 40 mg via INTRAVENOUS

## 2014-08-17 MED ORDER — CEFAZOLIN SODIUM-DEXTROSE 2-3 GM-% IV SOLR
2.0000 g | INTRAVENOUS | Status: AC
  Administered 2014-08-17: 2 g via INTRAVENOUS

## 2014-08-17 MED ORDER — FENTANYL CITRATE 0.05 MG/ML IJ SOLN
INTRAMUSCULAR | Status: AC
  Filled 2014-08-17: qty 2

## 2014-08-17 MED ORDER — BUPIVACAINE-EPINEPHRINE (PF) 0.5% -1:200000 IJ SOLN
INTRAMUSCULAR | Status: DC | PRN
  Administered 2014-08-17: 13 mL

## 2014-08-17 MED ORDER — CHLORHEXIDINE GLUCONATE 4 % EX LIQD: 1.0000 "application " | Freq: Once | CUTANEOUS | Status: DC

## 2014-08-17 MED ORDER — MIDAZOLAM HCL 2 MG/2ML IJ SOLN: 1.0000 mg | INTRAMUSCULAR | Status: DC | PRN

## 2014-08-17 MED ORDER — HEPARIN SOD (PORK) LOCK FLUSH 100 UNIT/ML IV SOLN
INTRAVENOUS | Status: DC | PRN
  Administered 2014-08-17: 600 [IU] via INTRAVENOUS

## 2014-08-17 MED ORDER — SUCCINYLCHOLINE CHLORIDE 20 MG/ML IJ SOLN
INTRAMUSCULAR | Status: DC | PRN
  Administered 2014-08-17: 100 mg via INTRAVENOUS

## 2014-08-17 MED ORDER — OXYCODONE HCL 5 MG/5ML PO SOLN: 5.0000 mg | Freq: Once | ORAL | Status: AC | PRN

## 2014-08-17 MED ORDER — CEFAZOLIN SODIUM-DEXTROSE 2-3 GM-% IV SOLR: 2.0000 g | INTRAVENOUS | Status: DC

## 2014-08-17 MED ORDER — PROPOFOL 10 MG/ML IV BOLUS
INTRAVENOUS | Status: DC | PRN
  Administered 2014-08-17: 150 mg via INTRAVENOUS

## 2014-08-17 MED ORDER — EPHEDRINE SULFATE 50 MG/ML IJ SOLN
INTRAMUSCULAR | Status: DC | PRN
  Administered 2014-08-17: 15 mg via INTRAVENOUS

## 2014-08-17 MED ORDER — GLYCOPYRROLATE 0.2 MG/ML IJ SOLN
INTRAMUSCULAR | Status: DC | PRN
  Administered 2014-08-17: 0.2 mg via INTRAVENOUS

## 2014-08-17 MED ORDER — BUPIVACAINE-EPINEPHRINE (PF) 0.5% -1:200000 IJ SOLN
INTRAMUSCULAR | Status: AC
Start: 2014-08-17 — End: 2014-08-17
  Filled 2014-08-17: qty 30

## 2014-08-17 MED ORDER — OXYCODONE HCL 5 MG PO TABS
ORAL_TABLET | ORAL | Status: AC
  Filled 2014-08-17: qty 1

## 2014-08-17 MED ORDER — CEFAZOLIN SODIUM-DEXTROSE 2-3 GM-% IV SOLR
INTRAVENOUS | Status: AC
  Filled 2014-08-17: qty 50

## 2014-08-17 MED ORDER — DEXAMETHASONE SODIUM PHOSPHATE 4 MG/ML IJ SOLN
INTRAMUSCULAR | Status: DC | PRN
  Administered 2014-08-17: 10 mg via INTRAVENOUS

## 2014-08-17 MED ORDER — FENTANYL CITRATE 0.05 MG/ML IJ SOLN: 50.0000 ug | INTRAMUSCULAR | Status: DC | PRN

## 2014-08-17 MED ORDER — HEPARIN (PORCINE) IN NACL 2-0.9 UNIT/ML-% IJ SOLN
INTRAMUSCULAR | Status: AC
  Filled 2014-08-17: qty 500

## 2014-08-17 MED ORDER — MIDAZOLAM HCL 2 MG/2ML IJ SOLN
INTRAMUSCULAR | Status: AC
  Filled 2014-08-17: qty 2

## 2014-08-17 MED ORDER — FENTANYL CITRATE 0.05 MG/ML IJ SOLN
INTRAMUSCULAR | Status: DC | PRN
  Administered 2014-08-17: 100 ug via INTRAVENOUS

## 2014-08-17 MED ORDER — TRAMADOL HCL 50 MG PO TABS: 50.0000 mg | ORAL_TABLET | Freq: Four times a day (QID) | ORAL | Status: DC | PRN

## 2014-08-17 MED ORDER — HEPARIN (PORCINE) IN NACL 2-0.9 UNIT/ML-% IJ SOLN
INTRAMUSCULAR | Status: DC | PRN
  Administered 2014-08-17: 500 mL via INTRAVENOUS

## 2014-08-17 MED ORDER — MIDAZOLAM HCL 5 MG/5ML IJ SOLN
INTRAMUSCULAR | Status: DC | PRN
  Administered 2014-08-17: 2 mg via INTRAVENOUS

## 2014-08-17 MED ORDER — HEPARIN SOD (PORK) LOCK FLUSH 100 UNIT/ML IV SOLN
INTRAVENOUS | Status: AC
  Filled 2014-08-17: qty 5

## 2014-08-17 MED ORDER — FENTANYL CITRATE 0.05 MG/ML IJ SOLN
25.0000 ug | INTRAMUSCULAR | Status: DC | PRN
  Administered 2014-08-17: 50 ug via INTRAVENOUS
  Administered 2014-08-17: 25 ug via INTRAVENOUS

## 2014-08-17 MED ORDER — LACTATED RINGERS IV SOLN
INTRAVENOUS | Status: DC
  Administered 2014-08-17 (×2): via INTRAVENOUS

## 2014-08-17 MED ORDER — OXYCODONE HCL 5 MG PO TABS
5.0000 mg | ORAL_TABLET | Freq: Once | ORAL | Status: AC | PRN
  Administered 2014-08-17: 5 mg via ORAL

## 2014-08-17 SURGICAL SUPPLY — 53 items
BAG DECANTER FOR FLEXI CONT (MISCELLANEOUS) ×4 IMPLANT
BENZOIN TINCTURE PRP APPL 2/3 (GAUZE/BANDAGES/DRESSINGS) IMPLANT
BLADE HEX COATED 2.75 (ELECTRODE) ×4 IMPLANT
BLADE SURG 15 STRL LF DISP TIS (BLADE) ×6 IMPLANT
BLADE SURG 15 STRL SS (BLADE) ×2
CANISTER SUCT 1200ML W/VALVE (MISCELLANEOUS) IMPLANT
CHLORAPREP W/TINT 26ML (MISCELLANEOUS) ×4 IMPLANT
COVER BACK TABLE 60X90IN (DRAPES) ×4 IMPLANT
COVER MAYO STAND STRL (DRAPES) ×4 IMPLANT
COVER PROBE 5X48 (MISCELLANEOUS) ×1
DECANTER SPIKE VIAL GLASS SM (MISCELLANEOUS) IMPLANT
DRAPE C-ARM 42X72 X-RAY (DRAPES) ×4 IMPLANT
DRAPE LAPAROSCOPIC ABDOMINAL (DRAPES) ×4 IMPLANT
DRAPE UTILITY XL STRL (DRAPES) ×4 IMPLANT
DRSG TEGADERM 2-3/8X2-3/4 SM (GAUZE/BANDAGES/DRESSINGS) IMPLANT
DRSG TEGADERM 4X10 (GAUZE/BANDAGES/DRESSINGS) IMPLANT
DRSG TEGADERM 4X4.75 (GAUZE/BANDAGES/DRESSINGS) IMPLANT
ELECT REM PT RETURN 9FT ADLT (ELECTROSURGICAL) ×4
ELECTRODE REM PT RTRN 9FT ADLT (ELECTROSURGICAL) ×3 IMPLANT
GLOVE BIOGEL PI IND STRL 7.0 (GLOVE) ×3 IMPLANT
GLOVE BIOGEL PI INDICATOR 7.0 (GLOVE) ×1
GLOVE ECLIPSE 6.5 STRL STRAW (GLOVE) ×4 IMPLANT
GLOVE EUDERMIC 7 POWDERFREE (GLOVE) ×4 IMPLANT
GOWN STRL REUS W/ TWL LRG LVL3 (GOWN DISPOSABLE) ×3 IMPLANT
GOWN STRL REUS W/ TWL XL LVL3 (GOWN DISPOSABLE) ×3 IMPLANT
GOWN STRL REUS W/TWL LRG LVL3 (GOWN DISPOSABLE) ×1
GOWN STRL REUS W/TWL XL LVL3 (GOWN DISPOSABLE) ×1
IV CATH PLACEMENT UNIT 16 GA (IV SOLUTION) IMPLANT
IV KIT MINILOC 20X1 SAFETY (NEEDLE) IMPLANT
KIT BARDPORT ISP (Port) IMPLANT
KIT CVR 48X5XPRB PLUP LF (MISCELLANEOUS) ×3 IMPLANT
KIT PORT POWER 8FR ISP CVUE (Catheter) ×4 IMPLANT
KIT PORT POWER ISP 8FR (Catheter) IMPLANT
LIQUID BAND (GAUZE/BANDAGES/DRESSINGS) ×4 IMPLANT
NEEDLE BLUNT 17GA (NEEDLE) IMPLANT
NEEDLE HYPO 22GX1.5 SAFETY (NEEDLE) ×4 IMPLANT
NEEDLE HYPO 25X1 1.5 SAFETY (NEEDLE) ×4 IMPLANT
PACK BASIN DAY SURGERY FS (CUSTOM PROCEDURE TRAY) ×4 IMPLANT
PENCIL BUTTON HOLSTER BLD 10FT (ELECTRODE) ×4 IMPLANT
SET SHEATH INTRODUCER 10FR (MISCELLANEOUS) IMPLANT
SHEATH COOK PEEL AWAY SET 9F (SHEATH) IMPLANT
SLEEVE SCD COMPRESS KNEE MED (MISCELLANEOUS) ×4 IMPLANT
SPONGE GAUZE 4X4 12PLY STER LF (GAUZE/BANDAGES/DRESSINGS) ×4 IMPLANT
STRIP CLOSURE SKIN 1/2X4 (GAUZE/BANDAGES/DRESSINGS) IMPLANT
SUT MNCRL AB 4-0 PS2 18 (SUTURE) ×4 IMPLANT
SUT PROLENE 2 0 CT2 30 (SUTURE) ×4 IMPLANT
SUT VICRYL 3-0 CR8 SH (SUTURE) ×4 IMPLANT
SYR 5ML LUER SLIP (SYRINGE) ×4 IMPLANT
SYRINGE 10CC LL (SYRINGE) ×8 IMPLANT
TOWEL OR 17X24 6PK STRL BLUE (TOWEL DISPOSABLE) ×8 IMPLANT
TOWEL OR NON WOVEN STRL DISP B (DISPOSABLE) ×4 IMPLANT
TUBE CONNECTING 20X1/4 (TUBING) IMPLANT
YANKAUER SUCT BULB TIP NO VENT (SUCTIONS) IMPLANT

## 2014-08-17 NOTE — Anesthesia Postprocedure Evaluation (Signed)
  Anesthesia Post-op Note  Patient: Abigail Wagner  Procedure(s) Performed: Procedure(s):  PLACE NEW PORT A CATH (Right) REMOVAL INTRAPERITONEAL CHEMO PORT (Right)  Patient Location: PACU  Anesthesia Type:General  Level of Consciousness: awake, alert  and oriented  Airway and Oxygen Therapy: Patient Spontanous Breathing  Post-op Pain: none  Post-op Assessment: Post-op Vital signs reviewed  Post-op Vital Signs: Reviewed  Last Vitals:  Filed Vitals:   08/17/14 1200  BP: 125/45  Pulse: 81  Temp: 36.5 C  Resp: 16    Complications: No apparent anesthesia complications

## 2014-08-17 NOTE — Anesthesia Procedure Notes (Signed)
Procedure Name: Intubation Performed by: Melynda Ripple D Pre-anesthesia Checklist: Patient identified, Emergency Drugs available, Suction available and Patient being monitored Patient Re-evaluated:Patient Re-evaluated prior to inductionOxygen Delivery Method: Circle System Utilized Preoxygenation: Pre-oxygenation with 100% oxygen Intubation Type: IV induction Ventilation: Mask ventilation without difficulty Laryngoscope Size: Mac and 3 Grade View: Grade II Tube type: Oral Tube size: 7.0 mm Number of attempts: 1 Airway Equipment and Method: stylet,  oral airway and LTA kit utilized Placement Confirmation: ETT inserted through vocal cords under direct vision,  positive ETCO2 and breath sounds checked- equal and bilateral Tube secured with: Tape Dental Injury: Teeth and Oropharynx as per pre-operative assessment

## 2014-08-17 NOTE — Discharge Instructions (Signed)
° ° °PORT-A-CATH: POST OP INSTRUCTIONS ° °Always review your discharge instruction sheet given to you by the facility where your surgery was performed.  ° °1. A prescription for pain medication may be given to you upon discharge. Take your pain medication as prescribed, if needed. If narcotic pain medicine is not needed, then you make take acetaminophen (Tylenol) or ibuprofen (Advil) as needed.  °2. Take your usually prescribed medications unless otherwise directed. °3. If you need a refill on your pain medication, please contact our office. All narcotic pain medicine now requires a paper prescription.  Phoned in and fax refills are no longer allowed by law.  Prescriptions will not be filled after 5 pm or on weekends.  °4. You should follow a light diet for the remainder of the day after your procedure. °5. Most patients will experience some mild swelling and/or bruising in the area of the incision. It may take several days to resolve. °6. It is common to experience some constipation if taking pain medication after surgery. Increasing fluid intake and taking a stool softener (such as Colace) will usually help or prevent this problem from occurring. A mild laxative (Milk of Magnesia or Miralax) should be taken according to package directions if there are no bowel movements after 48 hours.  °7. Unless discharge instructions indicate otherwise, you may remove your bandages 48 hours after surgery, and you may shower at that time. You may have steri-strips (small white skin tapes) in place directly over the incision.  These strips should be left on the skin for 7-10 days.  If your surgeon used Dermabond (skin glue) on the incision, you may shower in 24 hours.  The glue will flake off over the next 2-3 weeks.  °8. If your port is left accessed at the end of surgery (needle left in port), the dressing cannot get wet and should only by changed by a healthcare professional. When the port is no longer accessed (when the  needle has been removed), follow step 7.   °9. ACTIVITIES:  Limit activity involving your arms for the next 72 hours. Do no strenuous exercise or activity for 1 week. You may drive when you are no longer taking prescription pain medication, you can comfortably wear a seatbelt, and you can maneuver your car. °10.You may need to see your doctor in the office for a follow-up appointment.  Please °      check with your doctor.  °11.When you receive a new Port-a-Cath, you will get a product guide and  °      ID card.  Please keep them in case you need them. ° °WHEN TO CALL YOUR DOCTOR ([phone]): °1. Fever over 101.0 °2. Chills °3. Continued bleeding from incision °4. Increased redness and tenderness at the site °5. Shortness of breath, difficulty breathing ° ° °The clinic staff is available to answer your questions during regular business hours. Please don’t hesitate to call and ask to speak to one of the nurses or medical assistants for clinical concerns. If you have a medical emergency, go to the nearest emergency room or call 911.  A surgeon from [facility] is always on call at the hospital.  ° ° ° °For further information, please visit [email or web address] ° ° °Post Anesthesia Home Care Instructions ° °Activity: °Get plenty of rest for the remainder of the day. A responsible adult should stay with you for 24 hours following the procedure.  °For the next 24 hours, DO NOT: °-Drive a car °-  Operate machinery °-Drink alcoholic beverages °-Take any medication unless instructed by your physician °-Make any legal decisions or sign important papers. ° °Meals: °Start with liquid foods such as gelatin or soup. Progress to regular foods as tolerated. Avoid greasy, spicy, heavy foods. If nausea and/or vomiting occur, drink only clear liquids until the nausea and/or vomiting subsides. Call your physician if vomiting continues. ° °Special Instructions/Symptoms: °Your throat may feel dry or sore from the  anesthesia or the breathing tube placed in your throat during surgery. If this causes discomfort, gargle with warm salt water. The discomfort should disappear within 24 hours. ° ° ° ° ° ° °

## 2014-08-17 NOTE — Transfer of Care (Signed)
Immediate Anesthesia Transfer of Care Note  Patient: Abigail Wagner  Procedure(s) Performed: Procedure(s):  PLACE NEW PORT A CATH (Right) REMOVAL INTRAPERITONEAL CHEMO PORT (Right)  Patient Location: PACU  Anesthesia Type:General  Level of Consciousness: awake and alert   Airway & Oxygen Therapy: Patient Spontanous Breathing and Patient connected to face mask oxygen  Post-op Assessment: Report given to PACU RN and Post -op Vital signs reviewed and stable  Post vital signs: Reviewed and stable  Complications: No apparent anesthesia complications

## 2014-08-17 NOTE — Interval H&P Note (Signed)
History and Physical Interval Note:  08/17/2014 7:23 AM  Abigail Wagner  has presented today for surgery, with the diagnosis of ovarian cancer  The various methods of treatment have been discussed with the patient and family. After consideration of risks, benefits and other options for treatment, the patient has consented to  [place], INSERT PORT A CATH  as a surgical intervention .  The patient's history has been reviewed, patient examined TODAY, no change in status, stable for surgery.  I have reviewed the patient's chart and labs.  Questions were answered to the patient's satisfaction.     Adin Hector

## 2014-08-17 NOTE — Op Note (Signed)
Patient Name:           Abigail Wagner   Date of Surgery:        08/17/2014  Pre op Diagnosis:      Stage IIIB serous papillaty ovarian cancer  Post op Diagnosis:    Same  Procedure:                 Insertion of 8 French power port clear view tunneled venous vascular access device next line use of fluoroscopy for guidance and positioning Use of ultrasound for venipuncture Removal of intraperitoneal chemotherapy port  Surgeon:                     Edsel Petrin. Dalbert Batman, M.D., FACS  Assistant:                      OR staff  Operative Indications:   The patient is a 63 year old female who presents with a complaint of ovarian cancer epithelial stage iiib. This patient was referred back to me by Dr.Darovsky in [place]. Marland Kitchen He states that he would like me to place a Port-A-Cath and remove the IP port. This patient underwent diagnostic laparoscopy, omental biopsy and ovarian biopsy by me. This confirmed serous papillary ovarian cancer.  On July 04, 2014 she underwent laparotomy, omentectomy, bilateral salpingo-oophorectomy, and intraperitoneal port placement by Dr. Nancy Marus at [facility]  She was diagnosed with stage IIIB serous papillary ovarian carcinoma. Apparently she does not need intraperitoneal chemotherapy, but does need intravenous chemotherapy  She has done well since her surgery and feels pretty good.  She has been counseled as an outpatient regarding venous Port-A-Cath insertion and removal of the intraperitoneal Chemo-Port.  Operative Findings:       The right subclavian vein was very small and I was unable to get blood return. I therefore placed the Port-A-Cath through the right internal jugular vein. Intraoperative fluoroscopy looked very good and the catheter was functioning normally. Intraperitoneal port was in the right upper quadrant abdominal mall wall and was removed uneventfully.  Procedure in Detail:          Following the induction of general endotracheal anesthesia  the patient was positioned with a small roll behind her shoulders and her arms tucked at her sides. The neck chest and abdomen were prepped and draped in sterile fashion. Intravenous antibiotics were given. Surgical timeout was performed. 0.5% Marcaine with epinephrine was used as local infiltration anesthetic.    A right subclavian venipuncture was attempted but I could not get venous blood return. On one pass I got a little bit of arterial blood and abandoned this approach. I then brought the ultrasound to the field and with the patient's neck extended and turned left I could map out the jugular vein and carotid artery. A right  internal jugular venipuncture was performed with good blood return and the wire was inserted into the superior vena cava. Fluoroscopy was used to check position of the wire and to draw a template on the chest wall so that we could guide the tip of the catheter into the superior vena cava near the right atrial junction. Small incision was made at the wire insertion site. Made a 3 cm transverse incision about 2 cm below the right clavicle. A subcutaneous pocket was created at the  Level of the pectoralis fascia. Using a tunneling device I passed the catheter from the wire insertion site in the neck down into the  port pocket site in the infraclavicular area. Using a template I measured the catheter and cut it 25 cm in length. The catheter was secured to the port with the locking device, and the port and catheter flushed with heparinized saline. The port was sutured to the pectoralis fascia with 3 interrupted sutures of 2-0 Prolene. I passed the dilator and peel-away sheath assembly over the guidewire easily into the central venous circulation. The guidewire and dilator were removed and the catheter threaded through the peel-away sheath and the peel-away sheath removed. We made sure that the catheter turns smoothly in the neck. Had excellent blood return and the catheter flushed easily.  Fluoroscopy confirmed there is no deformity of the catheter anywhere along its course and the tip appeared to be in the superior vena cava near the right atrial junction. The port and catheter were flushed with concentrated heparin. There was no bleeding. Subcutaneous tissues were closed with 3-0 Vicryl sutures and the skin incisions closed with running subcuticular 4-0 Monocryl and Dermabond.    I then turned my attention to removal of the intraperitoneal port. The incision was in the right upper quadrant at the costal margin. This incision was reopened with a knife. Dissection was carried down to the port. I incised the capsule around the port. I elevated the port and cut all 3 nylon sutures. Once I did this I was able to slide the port and catheter out without difficulty. There was no bleeding. Subcutaneous tissues were closed with 3-0 Vicryl sutures and the skin closed with running 4-0 Monocryl subcuticular suture and Dermabond.     The patient was taken to the PACU in stable condition. EBL 15 mL. Counts correct. Complications none. Chest x-ray is planned.          Edsel Petrin. Dalbert Batman, M.D., FACS General and Minimally Invasive Surgery Breast and Colorectal Surgery  08/17/2014 10:21 AM

## 2014-08-17 NOTE — Anesthesia Preprocedure Evaluation (Addendum)
Anesthesia Evaluation  Patient identified by MRN, date of birth, ID band Patient awake    Reviewed: Allergy & Precautions, H&P , NPO status , Patient's Chart, lab work & pertinent test results  Airway Mallampati: I  TM Distance: >3 FB Neck ROM: Full    Dental  (+) Teeth Intact, Dental Advisory Given   Pulmonary Current Smoker,          Cardiovascular hypertension, Pt. on medications and Pt. on home beta blockers     Neuro/Psych  Headaches, Depression Bipolar Disorder    GI/Hepatic Neg liver ROS, GERD-  ,  Endo/Other  negative endocrine ROS  Renal/GU negative Renal ROS     Musculoskeletal  (+) Arthritis -,   Abdominal   Peds  Hematology negative hematology ROS (+)   Anesthesia Other Findings   Reproductive/Obstetrics                            Anesthesia Physical Anesthesia Plan  ASA: II  Anesthesia Plan: General   Post-op Pain Management:    Induction: Intravenous  Airway Management Planned: Oral ETT  Additional Equipment:   Intra-op Plan:   Post-operative Plan: Extubation in OR  Informed Consent: I have reviewed the patients History and Physical, chart, labs and discussed the procedure including the risks, benefits and alternatives for the proposed anesthesia with the patient or authorized representative who has indicated his/her understanding and acceptance.   Dental advisory given  Plan Discussed with: CRNA and Surgeon  Anesthesia Plan Comments:        Anesthesia Quick Evaluation

## 2014-08-19 ENCOUNTER — Encounter (HOSPITAL_BASED_OUTPATIENT_CLINIC_OR_DEPARTMENT_OTHER): Payer: Self-pay | Admitting: General Surgery

## 2014-08-25 DIAGNOSIS — D701 Agranulocytosis secondary to cancer chemotherapy: Secondary | ICD-10-CM | POA: Insufficient documentation

## 2014-08-25 DIAGNOSIS — T451X5A Adverse effect of antineoplastic and immunosuppressive drugs, initial encounter: Secondary | ICD-10-CM

## 2014-10-08 ENCOUNTER — Ambulatory Visit (INDEPENDENT_AMBULATORY_CARE_PROVIDER_SITE_OTHER): Payer: 59 | Admitting: Adult Health

## 2014-10-08 ENCOUNTER — Encounter: Payer: Self-pay | Admitting: Adult Health

## 2014-10-08 VITALS — BP 120/62 | Ht 65.0 in | Wt 154.5 lb

## 2014-10-08 DIAGNOSIS — N898 Other specified noninflammatory disorders of vagina: Secondary | ICD-10-CM

## 2014-10-08 LAB — POCT WET PREP (WET MOUNT): WBC WET PREP: POSITIVE

## 2014-10-08 MED ORDER — METRONIDAZOLE 0.75 % VA GEL: 1.0000 | Freq: Every day | VAGINAL | Status: DC

## 2014-10-08 NOTE — Patient Instructions (Signed)
Follow up in 2 weeks with me for recheck

## 2014-10-08 NOTE — Progress Notes (Signed)
Subjective:     Patient ID: FREJA FARO, female   DOB: 07/15/1951, 64 y.o.   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  Review of Systems See HPI Reviewed past medical,surgical, social and family history. Reviewed medications and allergies.     Objective:   Physical Exam BP 120/62 mmHg  Ht 5\' 5"  (1.651 m)  Wt 154 lb 8 oz (70.081 kg)  BMI 25.71 kg/m2   Skin warm and dry.Pelvic: external genitalia is normal in appearance for age, no lesions, vagina:scant white discharge without odor, cervix and uterus are absent,adnexa: no masses or tenderness noted. Wet prep: + for a lot of lactobacillus +WBCs.Dr Elonda Husky looked at slide too, and suggested metrogel.  Assessment:     Vaginal discharge with increased lactobacillus     Plan:    Rx metrogel 1 applicator at hs in vagina x 5 to 7 nights Return in 2 weeks for recheck

## 2014-10-16 ENCOUNTER — Ambulatory Visit: Payer: 59 | Admitting: Obstetrics and Gynecology

## 2014-10-20 ENCOUNTER — Ambulatory Visit (INDEPENDENT_AMBULATORY_CARE_PROVIDER_SITE_OTHER): Payer: PRIVATE HEALTH INSURANCE | Admitting: Psychiatry

## 2014-10-20 ENCOUNTER — Encounter (HOSPITAL_COMMUNITY): Payer: Self-pay | Admitting: Psychiatry

## 2014-10-20 VITALS — BP 136/72 | HR 83 | Ht 65.0 in | Wt 156.0 lb

## 2014-10-20 DIAGNOSIS — F329 Major depressive disorder, single episode, unspecified: Secondary | ICD-10-CM

## 2014-10-20 DIAGNOSIS — F39 Unspecified mood [affective] disorder: Secondary | ICD-10-CM

## 2014-10-20 DIAGNOSIS — F331 Major depressive disorder, recurrent, moderate: Secondary | ICD-10-CM

## 2014-10-20 MED ORDER — ESCITALOPRAM OXALATE 20 MG PO TABS: 20.0000 mg | ORAL_TABLET | Freq: Every day | ORAL | Status: DC

## 2014-10-20 MED ORDER — DIVALPROEX SODIUM 250 MG PO DR TAB: 250.0000 mg | DELAYED_RELEASE_TABLET | Freq: Every day | ORAL | Status: DC
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
## 2014-10-22 ENCOUNTER — Encounter: Payer: Self-pay | Admitting: Adult Health

## 2014-10-22 ENCOUNTER — Ambulatory Visit (INDEPENDENT_AMBULATORY_CARE_PROVIDER_SITE_OTHER): Payer: 59 | Admitting: Adult Health

## 2014-10-22 VITALS — BP 160/92 | Ht 65.0 in | Wt 154.5 lb

## 2014-10-22 DIAGNOSIS — N898 Other specified noninflammatory disorders of vagina: Secondary | ICD-10-CM

## 2014-10-22 NOTE — Patient Instructions (Signed)
Can use metrogel if reoccurs Follow up prn

## 2014-10-22 NOTE — Progress Notes (Signed)
Subjective:     Patient ID: CARIA TRANSUE, female   DOB: Dec 31, 1950, 64 y.o.   MRN: [id number]  HPI Sussan is a 64 year old white female, back in follow up of increased discharge and overgrowth of lactobacillus.She says she is much better.  Review of Systems See HPI Reviewed past medical,surgical, social and family history. Reviewed medications and allergies.     Objective:   Physical Exam BP 160/92 mmHg  Ht 5\' 5"  (1.651 m)  Wt 154 lb 8 oz (70.081 kg)  BMI 25.71 kg/m2   Skin warm and dry.Pelvic: external genitalia is normal in appearance, no lesions, vagina: no discharge, no odor, tissues pale and has loss of rugae, cervix and uterus absent, adnexa: no masses or tenderness noted.  Assessment:     Vaginal discharge resolved    Plan:     Can use metrogel if discharge reoccurs, will refill if needed   Follow up prn

## 2014-10-23 DIAGNOSIS — R112 Nausea with vomiting, unspecified: Secondary | ICD-10-CM | POA: Insufficient documentation

## 2014-10-23 DIAGNOSIS — T451X5A Adverse effect of antineoplastic and immunosuppressive drugs, initial encounter: Secondary | ICD-10-CM

## 2014-10-23 HISTORY — DX: Nausea with vomiting, unspecified: R11.2

## 2014-11-02 ENCOUNTER — Telehealth: Payer: Self-pay | Admitting: *Deleted

## 2014-11-02 MED ORDER — METRONIDAZOLE 0.75 % VA GEL: 1.0000 | Freq: Every day | VAGINAL | Status: DC

## 2014-11-02 NOTE — Telephone Encounter (Signed)
Spoke with pt. Pt has a vaginal discharge with odor. It came back 2 days ago. Has had 2 rounds of Metrogel; finished last round of Metrogel 10/22/14. Please advise. Thanks!! [place]

## 2014-11-02 NOTE — Telephone Encounter (Signed)
Has discharge again will rx metrogel, use 1 applicator at hs x 5 nights then 1-2 x weekly, if persists call me back

## 2014-11-05 ENCOUNTER — Ambulatory Visit (HOSPITAL_COMMUNITY): Payer: Self-pay | Admitting: Psychiatry

## 2014-11-25 ENCOUNTER — Telehealth (HOSPITAL_COMMUNITY): Payer: Self-pay | Admitting: *Deleted

## 2014-12-16 ENCOUNTER — Other Ambulatory Visit (HOSPITAL_COMMUNITY): Payer: Self-pay | Admitting: Internal Medicine

## 2014-12-16 DIAGNOSIS — C762 Malignant neoplasm of abdomen: Secondary | ICD-10-CM

## 2015-01-08 ENCOUNTER — Ambulatory Visit (HOSPITAL_COMMUNITY)
Admission: RE | Admit: 2015-01-08 | Discharge: 2015-01-08 | Disposition: A | Discharge status: Home / Self Care | Payer: 59 | Source: Ambulatory Visit | Attending: Internal Medicine | Admitting: Internal Medicine

## 2015-01-08 DIAGNOSIS — C569 Malignant neoplasm of unspecified ovary: Secondary | ICD-10-CM | POA: Diagnosis not present

## 2015-01-08 DIAGNOSIS — M545 Low back pain: Secondary | ICD-10-CM | POA: Diagnosis not present

## 2015-01-08 DIAGNOSIS — C762 Malignant neoplasm of abdomen: Secondary | ICD-10-CM

## 2015-01-08 DIAGNOSIS — Z90710 Acquired absence of both cervix and uterus: Secondary | ICD-10-CM | POA: Diagnosis not present

## 2015-01-08 DIAGNOSIS — Z9221 Personal history of antineoplastic chemotherapy: Secondary | ICD-10-CM | POA: Insufficient documentation

## 2015-01-08 MED ORDER — IOHEXOL 300 MG/ML  SOLN
100.0000 mL | Freq: Once | INTRAMUSCULAR | Status: AC | PRN
  Administered 2015-01-08: 100 mL via INTRAVENOUS

## 2015-01-13 DIAGNOSIS — Z95828 Presence of other vascular implants and grafts: Secondary | ICD-10-CM | POA: Insufficient documentation

## 2015-01-18 DIAGNOSIS — R970 Elevated carcinoembryonic antigen [CEA]: Secondary | ICD-10-CM | POA: Insufficient documentation

## 2015-01-19 ENCOUNTER — Ambulatory Visit (INDEPENDENT_AMBULATORY_CARE_PROVIDER_SITE_OTHER): Payer: PRIVATE HEALTH INSURANCE | Admitting: Psychiatry

## 2015-01-19 ENCOUNTER — Encounter (HOSPITAL_COMMUNITY): Payer: Self-pay | Admitting: Psychiatry

## 2015-01-19 VITALS — BP 164/82 | HR 86 | Ht 65.0 in | Wt 153.4 lb

## 2015-01-19 DIAGNOSIS — F331 Major depressive disorder, recurrent, moderate: Secondary | ICD-10-CM

## 2015-01-19 DIAGNOSIS — F39 Unspecified mood [affective] disorder: Secondary | ICD-10-CM

## 2015-01-19 MED ORDER — DIVALPROEX SODIUM 250 MG PO DR TAB: 250.0000 mg | DELAYED_RELEASE_TABLET | Freq: Every day | ORAL | Status: DC

## 2015-01-19 MED ORDER — ESCITALOPRAM OXALATE 20 MG PO TABS: 20.0000 mg | ORAL_TABLET | Freq: Every day | ORAL | Status: DC

## 2015-01-19 MED ORDER — LORAZEPAM 1 MG PO TABS: 1.0000 mg | ORAL_TABLET | Freq: Two times a day (BID) | ORAL | Status: DC
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
## 2015-02-16 ENCOUNTER — Other Ambulatory Visit (HOSPITAL_COMMUNITY): Payer: Self-pay | Admitting: Internal Medicine

## 2015-02-16 DIAGNOSIS — C762 Malignant neoplasm of abdomen: Secondary | ICD-10-CM

## 2015-03-02 ENCOUNTER — Other Ambulatory Visit: Payer: Self-pay | Admitting: Adult Health

## 2015-03-19 ENCOUNTER — Encounter (HOSPITAL_COMMUNITY): Payer: Self-pay | Admitting: Psychiatry

## 2015-03-19 ENCOUNTER — Ambulatory Visit (INDEPENDENT_AMBULATORY_CARE_PROVIDER_SITE_OTHER): Payer: 59 | Admitting: Psychiatry

## 2015-03-19 VITALS — BP 140/88 | Ht 65.0 in | Wt 151.0 lb

## 2015-03-19 DIAGNOSIS — F331 Major depressive disorder, recurrent, moderate: Secondary | ICD-10-CM

## 2015-03-19 DIAGNOSIS — F39 Unspecified mood [affective] disorder: Secondary | ICD-10-CM

## 2015-03-19 MED ORDER — LORAZEPAM 1 MG PO TABS: 1.0000 mg | ORAL_TABLET | Freq: Two times a day (BID) | ORAL | Status: DC

## 2015-03-19 MED ORDER — DIVALPROEX SODIUM 250 MG PO DR TAB: 250.0000 mg | DELAYED_RELEASE_TABLET | Freq: Every day | ORAL | Status: DC

## 2015-03-19 MED ORDER — ESCITALOPRAM OXALATE 20 MG PO TABS: 20.0000 mg | ORAL_TABLET | Freq: Every day | ORAL | Status: DC
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
## 2015-05-19 ENCOUNTER — Encounter (HOSPITAL_COMMUNITY): Payer: Self-pay

## 2015-05-19 ENCOUNTER — Ambulatory Visit (HOSPITAL_COMMUNITY)
Admission: RE | Admit: 2015-05-19 | Discharge: 2015-05-19 | Disposition: A | Discharge status: Home / Self Care | Payer: 59 | Source: Ambulatory Visit | Attending: Internal Medicine | Admitting: Internal Medicine

## 2015-05-19 DIAGNOSIS — C569 Malignant neoplasm of unspecified ovary: Secondary | ICD-10-CM | POA: Insufficient documentation

## 2015-05-19 DIAGNOSIS — Z9221 Personal history of antineoplastic chemotherapy: Secondary | ICD-10-CM | POA: Diagnosis not present

## 2015-05-19 DIAGNOSIS — I2510 Atherosclerotic heart disease of native coronary artery without angina pectoris: Secondary | ICD-10-CM | POA: Insufficient documentation

## 2015-05-19 DIAGNOSIS — Z90710 Acquired absence of both cervix and uterus: Secondary | ICD-10-CM | POA: Insufficient documentation

## 2015-05-19 DIAGNOSIS — N200 Calculus of kidney: Secondary | ICD-10-CM | POA: Diagnosis not present

## 2015-05-19 DIAGNOSIS — N8110 Cystocele, unspecified: Secondary | ICD-10-CM | POA: Diagnosis not present

## 2015-05-19 DIAGNOSIS — C762 Malignant neoplasm of abdomen: Secondary | ICD-10-CM

## 2015-05-19 LAB — POCT I-STAT CREATININE: CREATININE: 0.6 mg/dL (ref 0.44–1.00)

## 2015-05-19 MED ORDER — IOHEXOL 300 MG/ML  SOLN
100.0000 mL | Freq: Once | INTRAMUSCULAR | Status: AC | PRN
  Administered 2015-05-19: 100 mL via INTRAVENOUS

## 2015-05-20 ENCOUNTER — Other Ambulatory Visit (HOSPITAL_COMMUNITY): Payer: Self-pay | Admitting: Internal Medicine

## 2015-05-20 DIAGNOSIS — G43909 Migraine, unspecified, not intractable, without status migrainosus: Secondary | ICD-10-CM | POA: Insufficient documentation

## 2015-05-20 DIAGNOSIS — C569 Malignant neoplasm of unspecified ovary: Secondary | ICD-10-CM

## 2015-05-20 DIAGNOSIS — R970 Elevated carcinoembryonic antigen [CEA]: Secondary | ICD-10-CM

## 2015-06-01 ENCOUNTER — Other Ambulatory Visit: Payer: Self-pay | Admitting: Obstetrics and Gynecology

## 2015-06-01 DIAGNOSIS — Z1231 Encounter for screening mammogram for malignant neoplasm of breast: Secondary | ICD-10-CM

## 2015-06-09 ENCOUNTER — Ambulatory Visit (HOSPITAL_COMMUNITY)
Admission: RE | Admit: 2015-06-09 | Discharge: 2015-06-09 | Disposition: A | Discharge status: Home / Self Care | Payer: 59 | Source: Ambulatory Visit | Attending: Obstetrics and Gynecology | Admitting: Obstetrics and Gynecology

## 2015-06-09 ENCOUNTER — Ambulatory Visit (HOSPITAL_COMMUNITY): Payer: Self-pay

## 2015-06-09 DIAGNOSIS — Z1231 Encounter for screening mammogram for malignant neoplasm of breast: Secondary | ICD-10-CM | POA: Diagnosis not present

## 2015-07-19 ENCOUNTER — Ambulatory Visit (HOSPITAL_COMMUNITY): Payer: Self-pay | Admitting: Psychiatry

## 2015-07-20 ENCOUNTER — Other Ambulatory Visit: Payer: Self-pay | Admitting: Adult Health

## 2015-07-20 ENCOUNTER — Ambulatory Visit (INDEPENDENT_AMBULATORY_CARE_PROVIDER_SITE_OTHER): Payer: 59 | Admitting: Adult Health

## 2015-07-20 ENCOUNTER — Encounter: Payer: Self-pay | Admitting: Adult Health

## 2015-07-20 VITALS — BP 112/60 | HR 70 | Ht 65.5 in | Wt 147.5 lb

## 2015-07-20 DIAGNOSIS — R309 Painful micturition, unspecified: Secondary | ICD-10-CM

## 2015-07-20 DIAGNOSIS — N993 Prolapse of vaginal vault after hysterectomy: Secondary | ICD-10-CM | POA: Diagnosis not present

## 2015-07-20 DIAGNOSIS — R319 Hematuria, unspecified: Secondary | ICD-10-CM | POA: Insufficient documentation

## 2015-07-20 DIAGNOSIS — N949 Unspecified condition associated with female genital organs and menstrual cycle: Secondary | ICD-10-CM | POA: Diagnosis not present

## 2015-07-20 HISTORY — DX: Prolapse of vaginal vault after hysterectomy: N99.3

## 2015-07-20 LAB — POCT WET PREP (WET MOUNT): WBC WET PREP: POSITIVE

## 2015-07-20 LAB — POCT URINALYSIS DIPSTICK
Glucose, UA: NEGATIVE
NITRITE UA: NEGATIVE

## 2015-07-20 MED ORDER — SULFAMETHOXAZOLE-TRIMETHOPRIM 800-160 MG PO TABS: 1.0000 | ORAL_TABLET | Freq: Two times a day (BID) | ORAL | Status: DC

## 2015-07-20 NOTE — Progress Notes (Signed)
Subjective:     Patient ID: Abigail Wagner, female   DOB: 1951/02/18, 64 y.o.   MRN: [id number]  HPI Abigail Wagner is a 64 year old white female,married in complaining of vaginal burning and pain with urination.Has used valtrex and metrogel and still has symptoms.She is sp ovarian caner with ovaries and tubes removed,had hysterectomy years ago.   Review of Systems + vaginal burning and pain with urination,all other systems negative Reviewed past medical,surgical, social and family history. Reviewed medications and allergies.     Objective:   Physical Exam BP 112/60 mmHg  Pulse 70  Ht 5' 5.5" (1.664 m)  Wt 147 lb 8 oz (66.906 kg)  BMI 24.16 kg/m2   urine 2+ leuks,2+ protein and 3+ blood,  Skin warm and dry.Pelvic: external genitalia is normal in appearance no lesions, vagina: decrease color, moisture and rugae,urethra has no lesions or masses noted, cervix and uterus are absent and she has vaginal vault prolapse, adnexa: no masses or tenderness noted. Bladder is non tender and no masses felt. Wet prep: +WBCs.She says feels like air in vagina at times,discussed may be trying a pessary to see if helps,will treat with septra ds and recheck in 2 weeks   Assessment:     Vaginal burning Pain with urination Hematuria  Prolapse of vaginal vault    Plan:     UA C&S  Rx septra ds 1 bid x 7 days Increase apple juice or cranberry juice Review handout on hematuria and UTI

## 2015-07-20 NOTE — Addendum Note (Signed)
Addended by: Derrek Monaco A on: 07/20/2015 03:19 PM   Modules accepted: Orders

## 2015-07-20 NOTE — Patient Instructions (Signed)
Urinary Tract Infection °Urinary tract infections (UTIs) can develop anywhere along your urinary tract. Your urinary tract is your body's drainage system for removing wastes and extra water. Your urinary tract includes two kidneys, two ureters, a bladder, and a urethra. Your kidneys are a pair of bean-shaped organs. Each kidney is about the size of your fist. They are located below your ribs, one on each side of your spine. °CAUSES °Infections are caused by microbes, which are microscopic organisms, including fungi, viruses, and bacteria. These organisms are so small that they can only be seen through a microscope. Bacteria are the microbes that most commonly cause UTIs. °SYMPTOMS  °Symptoms of UTIs may vary by age and gender of the patient and by the location of the infection. Symptoms in young women typically include a frequent and intense urge to urinate and a painful, burning feeling in the bladder or urethra during urination. Older women and men are more likely to be tired, shaky, and weak and have muscle aches and abdominal pain. A fever may mean the infection is in your kidneys. Other symptoms of a kidney infection include pain in your back or sides below the ribs, nausea, and vomiting. °DIAGNOSIS °To diagnose a UTI, your caregiver will ask you about your symptoms. Your caregiver will also ask you to provide a urine sample. The urine sample will be tested for bacteria and white blood cells. White blood cells are made by your body to help fight infection. °TREATMENT  °Typically, UTIs can be treated with medication. Because most UTIs are caused by a bacterial infection, they usually can be treated with the use of antibiotics. The choice of antibiotic and length of treatment depend on your symptoms and the type of bacteria causing your infection. °HOME CARE INSTRUCTIONS °· If you were prescribed antibiotics, take them exactly as your caregiver instructs you. Finish the medication even if you feel better after  you have only taken some of the medication. °· Drink enough water and fluids to keep your urine clear or pale yellow. °· Avoid caffeine, tea, and carbonated beverages. They tend to irritate your bladder. °· Empty your bladder often. Avoid holding urine for long periods of time. °· Empty your bladder before and after sexual intercourse. °· After a bowel movement, women should cleanse from front to back. Use each tissue only once. °SEEK MEDICAL CARE IF:  °· You have back pain. °· You develop a fever. °· Your symptoms do not begin to resolve within 3 days. °SEEK IMMEDIATE MEDICAL CARE IF:  °· You have severe back pain or lower abdominal pain. °· You develop chills. °· You have nausea or vomiting. °· You have continued burning or discomfort with urination. °MAKE SURE YOU:  °· Understand these instructions. °· Will watch your condition. °· Will get help right away if you are not doing well or get worse. °  °This information is not intended to replace advice given to you by your health care provider. Make sure you discuss any questions you have with your health care provider. °  °Document Released: 06/21/2005 Document Revised: 06/02/2015 Document Reviewed: 10/20/2011 °Elsevier Interactive Patient Education ©2016 Elsevier Inc. ° °Hematuria, Adult °Hematuria is blood in your urine. It can be caused by a bladder infection, kidney infection, prostate infection, kidney stone, or cancer of your urinary tract. Infections can usually be treated with medicine, and a kidney stone usually will pass through your urine. If neither of these is the cause of your hematuria, further workup to find out   reason may be needed. It is very important that you tell your health care provider about any blood you see in your urine, even if the blood stops without treatment or happens without causing pain. Blood in your urine that happens and then stops and then happens again can be a symptom of a very serious condition. Also, pain is not a  symptom in the initial stages of many urinary cancers. HOME CARE INSTRUCTIONS   Drink lots of fluid, 3-4 quarts a day. If you have been diagnosed with an infection, cranberry juice is especially recommended, in addition to large amounts of water.  Avoid caffeine, tea, and carbonated beverages because they tend to irritate the bladder.  Avoid alcohol because it may irritate the prostate.  Take all medicines as directed by your health care provider.  If you were prescribed an antibiotic medicine, finish it all even if you start to feel better.  If you have been diagnosed with a kidney stone, follow your health care provider's instructions regarding straining your urine to catch the stone.  Empty your bladder often. Avoid holding urine for long periods of time.  After a bowel movement, women should cleanse front to back. Use each tissue only once.  Empty your bladder before and after sexual intercourse if you are a female. SEEK MEDICAL CARE IF:  You develop back pain.  You have a fever.  You have a feeling of sickness in your stomach (nausea) or vomiting.  Your symptoms are not better in 3 days. Return sooner if you are getting worse. SEEK IMMEDIATE MEDICAL CARE IF:   You develop severe vomiting and are unable to keep the medicine down.  You develop severe back or abdominal pain despite taking your medicines.  You begin passing a large amount of blood or clots in your urine.  You feel extremely weak or faint, or you pass out. MAKE SURE YOU:   Understand these instructions.  Will watch your condition.  Will get help right away if you are not doing well or get worse.   This information is not intended to replace advice given to you by your health care provider. Make sure you discuss any questions you have with your health care provider.   Document Released: 09/11/2005 Document Revised: 10/02/2014 Document Reviewed: 05/12/2013 Elsevier Interactive Patient Education 2016  Elsevier Inc. Take septra ds Try apple juice or cranberry juice

## 2015-07-21 LAB — URINALYSIS, ROUTINE W REFLEX MICROSCOPIC
Bilirubin, UA: NEGATIVE
Glucose, UA: NEGATIVE
KETONES UA: NEGATIVE
NITRITE UA: NEGATIVE
SPEC GRAV UA: 1.02 (ref 1.005–1.030)
Urobilinogen, Ur: 0.2 mg/dL (ref 0.2–1.0)
pH, UA: 6 (ref 5.0–7.5)

## 2015-07-21 LAB — MICROSCOPIC EXAMINATION
Casts: NONE SEEN /lpf
Epithelial Cells (non renal): NONE SEEN /hpf (ref 0–10)
WBC, UA: >30 /hpf — AB (ref 0–?)

## 2015-07-22 ENCOUNTER — Telehealth: Payer: Self-pay | Admitting: Adult Health

## 2015-07-22 LAB — URINE CULTURE

## 2015-07-22 MED ORDER — CIPROFLOXACIN HCL 500 MG PO TABS: 500.0000 mg | ORAL_TABLET | Freq: Two times a day (BID) | ORAL | Status: DC

## 2015-07-22 NOTE — Telephone Encounter (Signed)
Pt aware has E coli in urine stop septra ds,as resistant to septra, will rx cipro

## 2015-08-03 ENCOUNTER — Encounter: Payer: Self-pay | Admitting: Adult Health

## 2015-08-03 ENCOUNTER — Ambulatory Visit (INDEPENDENT_AMBULATORY_CARE_PROVIDER_SITE_OTHER): Payer: 59 | Admitting: Adult Health

## 2015-08-03 VITALS — BP 122/60 | HR 76 | Ht 65.0 in | Wt 144.0 lb

## 2015-08-03 DIAGNOSIS — R319 Hematuria, unspecified: Secondary | ICD-10-CM

## 2015-08-03 DIAGNOSIS — R198 Other specified symptoms and signs involving the digestive system and abdomen: Secondary | ICD-10-CM

## 2015-08-03 DIAGNOSIS — Z1212 Encounter for screening for malignant neoplasm of rectum: Secondary | ICD-10-CM

## 2015-08-03 DIAGNOSIS — N993 Prolapse of vaginal vault after hysterectomy: Secondary | ICD-10-CM | POA: Diagnosis not present

## 2015-08-03 DIAGNOSIS — R309 Painful micturition, unspecified: Secondary | ICD-10-CM | POA: Diagnosis not present

## 2015-08-03 DIAGNOSIS — K6289 Other specified diseases of anus and rectum: Secondary | ICD-10-CM | POA: Insufficient documentation

## 2015-08-03 DIAGNOSIS — N949 Unspecified condition associated with female genital organs and menstrual cycle: Secondary | ICD-10-CM

## 2015-08-03 DIAGNOSIS — Z8543 Personal history of malignant neoplasm of ovary: Secondary | ICD-10-CM

## 2015-08-03 HISTORY — DX: Personal history of malignant neoplasm of ovary: Z85.43

## 2015-08-03 LAB — POCT URINALYSIS DIPSTICK
Glucose, UA: NEGATIVE
NITRITE UA: NEGATIVE

## 2015-08-03 LAB — HEMOCCULT GUIAC POC 1CARD (OFFICE): Fecal Occult Blood, POC: NEGATIVE

## 2015-08-03 NOTE — Progress Notes (Signed)
Subjective:     Patient ID: Abigail Wagner, female   DOB: 1951/02/10, 64 y.o.   MRN: [id number]  HPI Abigail Wagner is a 64 year old white female,married in complaining of vaginal burning and pain with urination and was just treated for UTI with cipro,she had diarrhea Saturday and then noticed vaginal bleeding with small clots.  Review of Systems Patient denies any headaches, hearing loss, fatigue, blurred vision, shortness of breath, chest pain, abdominal pain, problems with bowel movements,  or intercourse(not having sex). No joint pain or mood swings. See HPI for positives. Reviewed past medical,surgical, social and family history. Reviewed medications and allergies.     Objective:   Physical Exam BP 122/60 mmHg  Pulse 76  Ht 5\' 5"  (1.651 m)  Wt 144 lb (65.318 kg)  BMI 23.96 kg/m2 urine dipstick 1+ leuks,1+ blood and trace protein, Skin warm and dry.Pelvic: external genitalia is normal in appearance, vagina: atrophic, with vaginal vault prolapse and cysteocele,urethra has no lesions or masses noted, cervix and uterus are absent, at introitus there is a dimple,?RV fistula, and thickness of about 4 cm,Dr Eure in for co exam and he did rectal with negative hemoccult,she has noticed air in vagina at times,adnexa: no masses or tenderness noted. Bladder is non tender and no masses felt.Spoke with Dr Thornton Papas in x ray as she had Ct of pelvis 05/19/15 and he did not see anything in rectum that was concerning, will get CT of pelvis with and with out contrast with rectal contrast prior to images.    Dr Elonda Husky says may need colonoscopy in near future depending on CT results.She is aware could be mass or fistula or abscess and will get CT to assess.   Assessment:     Rectal mass,(vs RV fistulas vs abscess) Vaginal burning Pain with urination Hematuria  Vaginal vault prolapse History of ovarian cancer     Plan:     UA C&S sent.  CT pelvis with and without contrast with rectal contrast prior to images at [facility] 11/9 at 4:30 pm  Follow up in 2 days for results

## 2015-08-03 NOTE — Patient Instructions (Signed)
CT 11/9 at 4:30 pm at [facility]  Follow up in 2 days

## 2015-08-04 ENCOUNTER — Other Ambulatory Visit: Payer: Self-pay | Admitting: Adult Health

## 2015-08-04 ENCOUNTER — Ambulatory Visit (HOSPITAL_COMMUNITY)
Admission: RE | Admit: 2015-08-04 | Discharge: 2015-08-04 | Disposition: A | Discharge status: Home / Self Care | Payer: 59 | Source: Ambulatory Visit | Attending: Adult Health | Admitting: Adult Health

## 2015-08-04 DIAGNOSIS — K5730 Diverticulosis of large intestine without perforation or abscess without bleeding: Secondary | ICD-10-CM | POA: Diagnosis not present

## 2015-08-04 DIAGNOSIS — N898 Other specified noninflammatory disorders of vagina: Secondary | ICD-10-CM | POA: Insufficient documentation

## 2015-08-04 DIAGNOSIS — R938 Abnormal findings on diagnostic imaging of other specified body structures: Secondary | ICD-10-CM | POA: Diagnosis not present

## 2015-08-04 DIAGNOSIS — K6289 Other specified diseases of anus and rectum: Secondary | ICD-10-CM

## 2015-08-04 DIAGNOSIS — C569 Malignant neoplasm of unspecified ovary: Secondary | ICD-10-CM | POA: Insufficient documentation

## 2015-08-04 DIAGNOSIS — I10 Essential (primary) hypertension: Secondary | ICD-10-CM | POA: Insufficient documentation

## 2015-08-04 DIAGNOSIS — Z9221 Personal history of antineoplastic chemotherapy: Secondary | ICD-10-CM | POA: Insufficient documentation

## 2015-08-04 LAB — URINALYSIS, ROUTINE W REFLEX MICROSCOPIC
Bilirubin, UA: NEGATIVE
GLUCOSE, UA: NEGATIVE
KETONES UA: NEGATIVE
NITRITE UA: NEGATIVE
Specific Gravity, UA: 1.02 (ref 1.005–1.030)
UUROB: 0.2 mg/dL (ref 0.2–1.0)
pH, UA: 6 (ref 5.0–7.5)

## 2015-08-04 LAB — MICROSCOPIC EXAMINATION: Casts: NONE SEEN /lpf

## 2015-08-04 LAB — POCT I-STAT CREATININE: Creatinine, Ser: 0.6 mg/dL (ref 0.44–1.00)

## 2015-08-04 MED ORDER — IOHEXOL 300 MG/ML  SOLN: 100.0000 mL | Freq: Once | INTRAMUSCULAR | Status: DC | PRN

## 2015-08-05 ENCOUNTER — Encounter: Payer: Self-pay | Admitting: Adult Health

## 2015-08-05 ENCOUNTER — Telehealth: Payer: Self-pay | Admitting: Adult Health

## 2015-08-05 ENCOUNTER — Ambulatory Visit (INDEPENDENT_AMBULATORY_CARE_PROVIDER_SITE_OTHER): Payer: 59 | Admitting: Adult Health

## 2015-08-05 VITALS — BP 120/60 | HR 70 | Ht 65.0 in | Wt 144.0 lb

## 2015-08-05 DIAGNOSIS — N390 Urinary tract infection, site not specified: Secondary | ICD-10-CM | POA: Diagnosis not present

## 2015-08-05 DIAGNOSIS — N823 Fistula of vagina to large intestine: Secondary | ICD-10-CM

## 2015-08-05 HISTORY — DX: Fistula of vagina to large intestine: N82.3

## 2015-08-05 LAB — URINE CULTURE

## 2015-08-05 MED ORDER — CIPROFLOXACIN HCL 500 MG PO TABS: 500.0000 mg | ORAL_TABLET | Freq: Two times a day (BID) | ORAL | Status: DC

## 2015-08-05 NOTE — Progress Notes (Signed)
Subjective:     Patient ID: Abigail Wagner, female   DOB: 1951/02/14, 64 y.o.   MRN: [id number]  HPI Abigail Wagner is a 64 year old white female,married in to discuss CT results and treatment options.  Review of Systems For review of CT results Reviewed past medical,surgical, social and family history. Reviewed medications and allergies.     Objective:   Physical Exam BP 120/60 mmHg  Pulse 70  Ht 5\' 5"  (1.651 m)  Wt 144 lb (65.318 kg)  BMI 23.96 kg/m2Reviewed CT and urine culture with pt and her husband, has RV fistula and + e coli in urine,will refer to colorectal surgeon,she has seen Dr Dalbert Batman before at [facility].   Face time 10 minutes with pt and husband,discussing results and options.  Assessment:     RV fistula  UTI    Plan:     Rx cipro 500 mg #14 take 1 bid x 7 days Push water Refer to colorectal surgeon,has appt [id number] with Dr Leighton Ruff at [place] [facility] at 11:20 am,as Dr Dalbert Batman out of office

## 2015-08-05 NOTE — Telephone Encounter (Signed)
Pt aware has appt 12/6 with Dr Marcello Moores

## 2015-08-05 NOTE — Patient Instructions (Signed)
Anal Fistula An anal fistula is an abnormal tunnel that develops between the bowel and the skin near the outside of the anus, where stool (feces) comes out. The anus has many tiny glands that make lubricating fluid. Sometimes, these glands become plugged and infected, and that can cause a fluid-filled pocket (abscess) to form. An anal fistula often develops after this infection or abscess. CAUSES In most cases, an anal fistula is caused by a past or current anal abscess. Other causes include:  A complication of surgery.  Trauma to the rectal area.  Radiation to the area.  Medical conditions or diseases, such as:  Chronic inflammatory bowel disease, such as Crohn disease or ulcerative colitis.  Colon cancer or rectal cancer.  Diverticular disease, such as diverticulitis.  An STD (sexually transmitted disease), such as gonorrhea, chlamydia, or syphilis.  An infection that is caused by HIV (human immunodeficiency virus).  Foreign body in the rectum. SYMPTOMS Symptoms of this condition include:  Throbbing or constant pain that may be worse while you are sitting.  Swelling or irritation around the anus.  Drainage of pus or blood from an opening near the anus.  Pain with bowel movements.  Fever or chills. DIAGNOSIS Your health care provider will examine the area to find the openings of the anal fistula and the fistula tract. The external opening of the anal fistula may be seen during a physical exam. You may also have tests, including:  An exam of the rectal area with a gloved hand (digital rectal exam).  An exam with a probe or scope to help locate the internal opening of the fistula.  Imaging tests to find the exact location and path of the fistula. These tests may include X-rays, an ultrasound, a CT scan, or MRI. The path is made visible by a dye that is injected into the fistula opening. You may have other tests to find the cause of the anal fistula. TREATMENT The most  common treatment for an anal fistula is surgery. The type of surgery that is used will depend on where the fistula is located and how complex the fistula is. Surgical options include:  A fistulotomy. The whole fistula is opened up, and the contents are drained to promote healing.  Seton placement. A silk string (seton) is placed into the fistula during a fistulotomy. This helps to drain any infection to promote healing.  Advancement flap procedure. Tissue is removed from your rectum or the skin around the anus and is attached to the opening of the fistula.  Bioprosthetic plug. A cone-shaped plug is made from your tissue and is used to block the opening of the fistula. Some anal fistulas do not require surgery. A nonsurgical treatment option involves injecting a fibrin glue to seal the fistula. You also may be prescribed an antibiotic medicine to treat an infection. HOME CARE INSTRUCTIONS Medicines  Take over-the-counter and prescription medicines only as told by your health care provider.  If you were prescribed an antibiotic medicine, take it as told by your health care provider. Do not stop taking the antibiotic even if you start to feel better.  Use a stool softener or a laxative if told to do so by your health care provider. General Instructions  Eat a high-fiber diet as told by your health care provider. This can help to prevent constipation.  Drink enough fluid to keep your urine clear or pale yellow.  Take a warm sitz bath for 15-20 minutes, 3-4 times per day, or as  told by your health care provider. Sitz baths can ease your pain and discomfort and help with healing.  Follow good hygiene to keep the anal area as clean and dry as possible. Use wet toilet paper or a moist towelette after each bowel movement.  Keep all follow-up visits as told by your health care provider. This is important. SEEK MEDICAL CARE IF:  You have increased pain that is not controlled with  medicines.  You have new redness or swelling around the anal area.  You have new fluid, blood, or pus coming from the anal area.  You have tenderness or warmth around the anal area. SEEK IMMEDIATE MEDICAL CARE IF:  You have a fever.  You have severe pain.  You have chills or diarrhea.  You have severe problems urinating or having a bowel movement.   This information is not intended to replace advice given to you by your health care provider. Make sure you discuss any questions you have with your health care provider.   Document Released: 08/24/2008 Document Revised: 06/02/2015 Document Reviewed: 12/07/2014 Elsevier Interactive Patient Education 2016 Elsevier Inc. Take cipro refer to Dr Dalbert Batman

## 2015-08-31 ENCOUNTER — Other Ambulatory Visit: Payer: Self-pay | Admitting: General Surgery

## 2015-08-31 NOTE — H&P (Signed)
History of Present Illness   Patient words: fistula.  The patient is a 64 year old female who presents with rectovaginal fistula. Patient presents to the office with complaints of stool per fragile enough for the past 3 months. She was seen by her gynecologist who is treating her ovarian cancer. A CT scan of the pelvis was ordered. This was done with rectal contrast. An obvious rectovaginal fistula was seen distally. She states that she has never had any anorectal surgeries before. She denies any history of pelvic radiation. She does remember having an abscess that was drained many years back. Patient is an active smoker and smokes approximately 1/2-1 pack a day. She has a history of recent UTI and is taking Cipro for this.      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  Voltaren (75MG  Tablet DR, Oral) Active.  Bentyl (10MG  Capsule, Oral) Active.  Depakote ER (250MG  Tablet ER 24HR, Oral) Active.  Lexapro (20MG  Tablet, Oral) Active.  Anusol-HC (2.5% Cream, Rectal) Active.  Metoprolol Tartrate (25MG  Tablet, Oral) Active.  Multiple Vitamin ( Oral) Active.  RA Krill Oil (500MG  Capsule, Oral) Active.  Ativan (1MG  Tablet, Oral) Active.  Medications Reconciled.  Altabax (1% Ointment, External) Active.  Vitamin C ER (500MG  Tablet ER, Oral) Active.  Estrace (2MG   Tablet, Oral) Active.  Folic Acid (0000000 Tablet, Oral) Active.  Norco (5-325MG  Tablet, Oral) Active.  Prevacid (15MG  Capsule DR, Oral) Active.  ValACYclovir HCl (1GM Tablet, Oral) Active.    Pregnancy/Birth   Age at menarche: 57 years  Para  72  Contraceptive History: Oral contraceptives  Age of menopause  67-60  Maternal age  54-20  Gravida  3    Other Problems   Hemorrhoids  Gastroesophageal Reflux Disease  Kidney Stone  High blood pressure  Depression  Alcohol Abuse  Arthritis  Anxiety Disorder    Past Surgical   Hysterectomy (not due to cancer) - Partial  Cesarean Section - 1  Knee Surgery: Left  Breast Augmentation: Bilateral    Diagnostic Studies   Mammogram; 1-3 years ago  Colonoscopy; 1-5 years ago  Pap Smear; 1-5 years ago     Vitals   08/31/2015 11:25 AM   Weight: 144 lb Height: 65.5 in   Body Surface Area: 1.73 m Body Mass Index: 23.6 kg/m     Temp.: 97.5 F Pulse: 78 (Regular)   BP: 126/78 Manual (Sitting, Left Arm, Standard)     Physical Exam  The physical exam findings are as follows:  General   Pleasant. Looks good. No distress. Husband is with her.   Head and Neck   Neck reveals no adenopathy or mass. Good range of motion. Clavicles normal without deformity.   Chest and Lung Exam   Clear to auscultation bilaterally   Cardiovascular   regular rate and rhythm. No  murmur. No ectopy.   Abdomen   Soft. Not distended. Portin right upper quadrant without infection. Lower midline incision well healed.   Female Genitourinary Rectovaginal Exam - Mass - Present.   Rectal Anorectal Exam - External - normal external exam. Internal - Note: Mass palpated anteriorly.   Neurologic Neurologic evaluation reveals - alert and oriented x 3 with no impairment of recent or remote memory.    Assessment & Plan  RECTOVAGINAL FISTULA (N82.3)  Today's Impression: 64 year old female recently treated for ovarian cancer. She was noted to have vaginal  drainage over the last 3 months. CT scan of the pelvis demonstrates a rectovaginal fistula with contrast extravasating into the distal vagina. On exam she has a mass located at the distal rectovaginal septum. I do not see any obvious signs of carcinoma currently, but I am suspicious that this is the cause of her fistula. I have recommended an exam under anesthesia with biopsy of the mass. We will plan on placing a seton through the fistula tract as well. We will then allow the inflammation to calm down and confirm that this is not a cancer that we are dealing with. I will then take her back to the operating room and perform a primary repair of her fistula once the inflammation has calmed down and she has quit smoking.

## 2015-09-07 ENCOUNTER — Encounter (HOSPITAL_BASED_OUTPATIENT_CLINIC_OR_DEPARTMENT_OTHER): Payer: Self-pay | Admitting: *Deleted

## 2015-09-07 NOTE — Progress Notes (Signed)
NPO AFTER MN WITH EXCEPTION CLEAR LIQUIDS UNTIL 0830 (NO CREAM/ MILK PRODUCTS).  ARRIVE AT [id number].  NEEDS ISTAT AND EKG.  WILL TAKE ATIVAN, PREVACID, METOPROLOL, AND LEXAPRO AM DOS W/ SIPS OF WATER.

## 2015-09-09 ENCOUNTER — Ambulatory Visit (HOSPITAL_BASED_OUTPATIENT_CLINIC_OR_DEPARTMENT_OTHER)
Admission: RE | Admit: 2015-09-09 | Discharge: 2015-09-09 | Disposition: A | Discharge status: Home / Self Care | Payer: 59 | Source: Ambulatory Visit | Attending: General Surgery | Admitting: General Surgery

## 2015-09-09 ENCOUNTER — Encounter (HOSPITAL_BASED_OUTPATIENT_CLINIC_OR_DEPARTMENT_OTHER)
Admission: RE | Disposition: A | Discharge status: Home / Self Care | Payer: Self-pay | Source: Ambulatory Visit | Attending: General Surgery

## 2015-09-09 ENCOUNTER — Encounter (HOSPITAL_BASED_OUTPATIENT_CLINIC_OR_DEPARTMENT_OTHER): Payer: Self-pay

## 2015-09-09 ENCOUNTER — Ambulatory Visit (HOSPITAL_BASED_OUTPATIENT_CLINIC_OR_DEPARTMENT_OTHER): Payer: 59 | Admitting: Certified Registered"

## 2015-09-09 DIAGNOSIS — C763 Malignant neoplasm of pelvis: Secondary | ICD-10-CM | POA: Insufficient documentation

## 2015-09-09 DIAGNOSIS — Z79899 Other long term (current) drug therapy: Secondary | ICD-10-CM | POA: Diagnosis not present

## 2015-09-09 DIAGNOSIS — Z791 Long term (current) use of non-steroidal anti-inflammatories (NSAID): Secondary | ICD-10-CM | POA: Insufficient documentation

## 2015-09-09 DIAGNOSIS — Z79890 Hormone replacement therapy (postmenopausal): Secondary | ICD-10-CM | POA: Insufficient documentation

## 2015-09-09 DIAGNOSIS — M1990 Unspecified osteoarthritis, unspecified site: Secondary | ICD-10-CM | POA: Diagnosis not present

## 2015-09-09 DIAGNOSIS — F17200 Nicotine dependence, unspecified, uncomplicated: Secondary | ICD-10-CM | POA: Diagnosis not present

## 2015-09-09 DIAGNOSIS — Z8543 Personal history of malignant neoplasm of ovary: Secondary | ICD-10-CM | POA: Insufficient documentation

## 2015-09-09 DIAGNOSIS — F329 Major depressive disorder, single episode, unspecified: Secondary | ICD-10-CM | POA: Insufficient documentation

## 2015-09-09 DIAGNOSIS — K219 Gastro-esophageal reflux disease without esophagitis: Secondary | ICD-10-CM | POA: Insufficient documentation

## 2015-09-09 DIAGNOSIS — N823 Fistula of vagina to large intestine: Secondary | ICD-10-CM | POA: Diagnosis not present

## 2015-09-09 DIAGNOSIS — F39 Unspecified mood [affective] disorder: Secondary | ICD-10-CM

## 2015-09-09 DIAGNOSIS — I10 Essential (primary) hypertension: Secondary | ICD-10-CM | POA: Insufficient documentation

## 2015-09-09 DIAGNOSIS — F419 Anxiety disorder, unspecified: Secondary | ICD-10-CM | POA: Diagnosis not present

## 2015-09-09 DIAGNOSIS — K602 Anal fissure, unspecified: Secondary | ICD-10-CM | POA: Insufficient documentation

## 2015-09-09 DIAGNOSIS — F331 Major depressive disorder, recurrent, moderate: Secondary | ICD-10-CM

## 2015-09-09 HISTORY — DX: Personal history of suicidal behavior: Z91.51

## 2015-09-09 HISTORY — DX: Malignant neoplasm of unspecified ovary: C56.9

## 2015-09-09 HISTORY — DX: Unspecified osteoarthritis, unspecified site: M19.90

## 2015-09-09 HISTORY — DX: Personal history of self-harm: Z91.5

## 2015-09-09 HISTORY — DX: Diverticulosis of large intestine without perforation or abscess without bleeding: K57.30

## 2015-09-09 HISTORY — DX: Other intervertebral disc degeneration, lumbosacral region without mention of lumbar back pain or lower extremity pain: M51.379

## 2015-09-09 HISTORY — DX: Personal history of other diseases of the respiratory system: Z87.09

## 2015-09-09 HISTORY — DX: Major depressive disorder, single episode, unspecified: F32.9

## 2015-09-09 HISTORY — PX: RECTAL BIOPSY: SHX2303

## 2015-09-09 HISTORY — DX: Other intervertebral disc degeneration, lumbosacral region: M51.37

## 2015-09-09 HISTORY — DX: Unspecified mood (affective) disorder: F39

## 2015-09-09 HISTORY — PX: PLACEMENT OF SETON: SHX6029

## 2015-09-09 HISTORY — DX: Personal history of other infectious and parasitic diseases: Z86.19

## 2015-09-09 LAB — POCT I-STAT 4, (NA,K, GLUC, HGB,HCT)
GLUCOSE: 79 mg/dL (ref 65–99)
HCT: 39 % (ref 36.0–46.0)
HEMOGLOBIN: 13.3 g/dL (ref 12.0–15.0)
POTASSIUM: 3.8 mmol/L (ref 3.5–5.1)
Sodium: 139 mmol/L (ref 135–145)

## 2015-09-09 SURGERY — EXAM UNDER ANESTHESIA
Anesthesia: Monitor Anesthesia Care | Site: Rectum

## 2015-09-09 MED ORDER — BUPIVACAINE-EPINEPHRINE 0.5% -1:200000 IJ SOLN
INTRAMUSCULAR | Status: DC | PRN
  Administered 2015-09-09: 20 mL

## 2015-09-09 MED ORDER — SODIUM CHLORIDE 0.9 % IV SOLN
250.0000 mL | INTRAVENOUS | Status: DC | PRN
  Filled 2015-09-09: qty 250

## 2015-09-09 MED ORDER — METRONIDAZOLE 0.75 % VA GEL: VAGINAL | Status: DC

## 2015-09-09 MED ORDER — OXYCODONE HCL 5 MG PO TABS
5.0000 mg | ORAL_TABLET | ORAL | Status: DC | PRN
  Filled 2015-09-09: qty 2

## 2015-09-09 MED ORDER — ACETAMINOPHEN 650 MG RE SUPP
650.0000 mg | RECTAL | Status: DC | PRN
  Filled 2015-09-09: qty 1

## 2015-09-09 MED ORDER — HYDROCODONE-ACETAMINOPHEN 5-325 MG PO TABS: 1.0000 | ORAL_TABLET | Freq: Four times a day (QID) | ORAL | Status: DC | PRN

## 2015-09-09 MED ORDER — HYDROCORTISONE 2.5 % RE CREA: TOPICAL_CREAM | RECTAL | Status: DC | PRN

## 2015-09-09 MED ORDER — PROPOFOL 500 MG/50ML IV EMUL
INTRAVENOUS | Status: DC | PRN
  Administered 2015-09-09: 160 ug/kg/min via INTRAVENOUS

## 2015-09-09 MED ORDER — BUPIVACAINE-EPINEPHRINE (PF) 0.5% -1:200000 IJ SOLN
INTRAMUSCULAR | Status: AC
  Filled 2015-09-09: qty 30

## 2015-09-09 MED ORDER — HEPARIN SODIUM (PORCINE) 5000 UNIT/ML IJ SOLN
INTRAMUSCULAR | Status: AC
  Filled 2015-09-09: qty 1

## 2015-09-09 MED ORDER — HYALURONIDASE OVINE 200 UNIT/ML IJ SOLN
INTRAMUSCULAR | Status: AC
  Filled 2015-09-09: qty 1.2

## 2015-09-09 MED ORDER — SODIUM CHLORIDE 0.9 % IJ SOLN
3.0000 mL | Freq: Two times a day (BID) | INTRAMUSCULAR | Status: DC
  Filled 2015-09-09: qty 3

## 2015-09-09 MED ORDER — LIDOCAINE 5 % EX OINT
TOPICAL_OINTMENT | CUTANEOUS | Status: AC
  Filled 2015-09-09: qty 35.44

## 2015-09-09 MED ORDER — SODIUM CHLORIDE 0.9 % IJ SOLN
3.0000 mL | INTRAMUSCULAR | Status: DC | PRN
  Filled 2015-09-09: qty 3

## 2015-09-09 MED ORDER — LACTATED RINGERS IV SOLN
INTRAVENOUS | Status: DC
  Administered 2015-09-09: 13:00:00 via INTRAVENOUS
  Filled 2015-09-09: qty 1000

## 2015-09-09 MED ORDER — FENTANYL CITRATE (PF) 100 MCG/2ML IJ SOLN
INTRAMUSCULAR | Status: DC | PRN
  Administered 2015-09-09: 50 ug via INTRAVENOUS
  Administered 2015-09-09: 25 ug via INTRAVENOUS

## 2015-09-09 MED ORDER — BUPIVACAINE LIPOSOME 1.3 % IJ SUSP
INTRAMUSCULAR | Status: AC
  Filled 2015-09-09: qty 20

## 2015-09-09 MED ORDER — MIDAZOLAM HCL 5 MG/5ML IJ SOLN
INTRAMUSCULAR | Status: DC | PRN
  Administered 2015-09-09: 2 mg via INTRAVENOUS

## 2015-09-09 MED ORDER — FENTANYL CITRATE (PF) 100 MCG/2ML IJ SOLN
INTRAMUSCULAR | Status: AC
  Filled 2015-09-09: qty 2

## 2015-09-09 MED ORDER — ACETAMINOPHEN 325 MG PO TABS
650.0000 mg | ORAL_TABLET | ORAL | Status: DC | PRN
  Filled 2015-09-09: qty 2

## 2015-09-09 MED ORDER — HEPARIN SODIUM (PORCINE) 5000 UNIT/ML IJ SOLN
5000.0000 [IU] | Freq: Once | INTRAMUSCULAR | Status: AC
  Administered 2015-09-09: 5000 [IU] via SUBCUTANEOUS
  Filled 2015-09-09: qty 1

## 2015-09-09 MED ORDER — MIDAZOLAM HCL 2 MG/2ML IJ SOLN
INTRAMUSCULAR | Status: AC
  Filled 2015-09-09: qty 2

## 2015-09-09 MED ORDER — DIVALPROEX SODIUM 250 MG PO DR TAB: 250.0000 mg | DELAYED_RELEASE_TABLET | Freq: Every evening | ORAL | Status: DC

## 2015-09-09 MED ORDER — ESCITALOPRAM OXALATE 20 MG PO TABS: 20.0000 mg | ORAL_TABLET | Freq: Every morning | ORAL | Status: DC

## 2015-09-09 MED ORDER — ACETIC ACID 5 % SOLN
Status: AC
  Filled 2015-09-09: qty 500

## 2015-09-09 SURGICAL SUPPLY — 61 items
BENZOIN TINCTURE PRP APPL 2/3 (GAUZE/BANDAGES/DRESSINGS) ×4 IMPLANT
BLADE HEX COATED 2.75 (ELECTRODE) ×2 IMPLANT
BLADE SURG 15 STRL LF DISP TIS (BLADE) ×1 IMPLANT
BLADE SURG 15 STRL SS (BLADE) ×1
BRIEF STRETCH FOR OB PAD LRG (UNDERPADS AND DIAPERS) ×4 IMPLANT
CANISTER SUCTION 2500CC (MISCELLANEOUS) ×2 IMPLANT
CLOTH BEACON ORANGE TIMEOUT ST (SAFETY) ×2 IMPLANT
COVER BACK TABLE 60X90IN (DRAPES) ×2 IMPLANT
COVER MAYO STAND STRL (DRAPES) ×2 IMPLANT
DECANTER SPIKE VIAL GLASS SM (MISCELLANEOUS) ×2 IMPLANT
DRAPE LG THREE QUARTER DISP (DRAPES) ×2 IMPLANT
DRAPE PED LAPAROTOMY (DRAPES) ×2 IMPLANT
DRAPE UNDERBUTTOCKS STRL (DRAPE) IMPLANT
DRAPE UTILITY XL STRL (DRAPES) ×2 IMPLANT
DRSG PAD ABDOMINAL 8X10 ST (GAUZE/BANDAGES/DRESSINGS) ×2 IMPLANT
ELECT BLADE 6.5 .24CM SHAFT (ELECTRODE) IMPLANT
ELECT REM PT RETURN 9FT ADLT (ELECTROSURGICAL) ×2
ELECTRODE REM PT RTRN 9FT ADLT (ELECTROSURGICAL) ×1 IMPLANT
GAUZE SPONGE 4X4 16PLY XRAY LF (GAUZE/BANDAGES/DRESSINGS) ×2 IMPLANT
GAUZE VASELINE 3X9 (GAUZE/BANDAGES/DRESSINGS) IMPLANT
GLOVE BIO SURGEON STRL SZ 6.5 (GLOVE) ×4 IMPLANT
GLOVE INDICATOR 7.0 STRL GRN (GLOVE) ×4 IMPLANT
GLOVE INDICATOR 7.5 STRL GRN (GLOVE) ×6 IMPLANT
GLOVE SURG SS PI 7.5 STRL IVOR (GLOVE) ×2 IMPLANT
GOWN STRL REUS W/ TWL LRG LVL3 (GOWN DISPOSABLE) ×2 IMPLANT
GOWN STRL REUS W/ TWL XL LVL3 (GOWN DISPOSABLE) ×2 IMPLANT
GOWN STRL REUS W/TWL 2XL LVL3 (GOWN DISPOSABLE) ×2 IMPLANT
GOWN STRL REUS W/TWL LRG LVL3 (GOWN DISPOSABLE) ×2
GOWN STRL REUS W/TWL XL LVL3 (GOWN DISPOSABLE) ×2
HEMOSTAT SURGICEL 2X14 (HEMOSTASIS) ×2 IMPLANT
HYDROGEN PEROXIDE 16OZ (MISCELLANEOUS) IMPLANT
KIT ROOM TURNOVER WOR (KITS) ×2 IMPLANT
LEGGING LITHOTOMY PAIR STRL (DRAPES) IMPLANT
LOOP VESSEL MAXI BLUE (MISCELLANEOUS) ×2 IMPLANT
MANIFOLD NEPTUNE II (INSTRUMENTS) IMPLANT
NDL SAFETY ECLIPSE 18X1.5 (NEEDLE) IMPLANT
NEEDLE HYPO 18GX1.5 SHARP (NEEDLE)
NEEDLE HYPO 25X1 1.5 SAFETY (NEEDLE) ×2 IMPLANT
NS IRRIG 500ML POUR BTL (IV SOLUTION) ×2 IMPLANT
PACK BASIN DAY SURGERY FS (CUSTOM PROCEDURE TRAY) ×2 IMPLANT
PAD ABD 8X10 STRL (GAUZE/BANDAGES/DRESSINGS) IMPLANT
PAD ARMBOARD 7.5X6 YLW CONV (MISCELLANEOUS) IMPLANT
PENCIL BUTTON HOLSTER BLD 10FT (ELECTRODE) ×2 IMPLANT
SPONGE GAUZE 4X4 12PLY (GAUZE/BANDAGES/DRESSINGS) IMPLANT
SPONGE GAUZE 4X4 12PLY STER LF (GAUZE/BANDAGES/DRESSINGS) ×2 IMPLANT
SPONGE SURGIFOAM ABS GEL 12-7 (HEMOSTASIS) IMPLANT
SUT CHROMIC 2 0 SH (SUTURE) IMPLANT
SUT CHROMIC 3 0 SH 27 (SUTURE) IMPLANT
SUT ETHIBOND 0 (SUTURE) IMPLANT
SUT MON AB 3-0 SH 27 (SUTURE)
SUT MON AB 3-0 SH27 (SUTURE) IMPLANT
SUT VIC AB 2-0 SH 27 (SUTURE)
SUT VIC AB 2-0 SH 27XBRD (SUTURE) IMPLANT
SUT VIC AB 4-0 P-3 18XBRD (SUTURE) IMPLANT
SUT VIC AB 4-0 P3 18 (SUTURE)
SYR CONTROL 10ML LL (SYRINGE) ×2 IMPLANT
TOWEL OR 17X24 6PK STRL BLUE (TOWEL DISPOSABLE) ×4 IMPLANT
TOWEL OR 17X26 10 PK STRL BLUE (TOWEL DISPOSABLE) ×2 IMPLANT
TRAY DSU PREP LF (CUSTOM PROCEDURE TRAY) ×2 IMPLANT
TUBE CONNECTING 12X1/4 (SUCTIONS) ×2 IMPLANT
YANKAUER SUCT BULB TIP NO VENT (SUCTIONS) ×2 IMPLANT

## 2015-09-09 NOTE — Anesthesia Postprocedure Evaluation (Signed)
Anesthesia Post Note  Patient: [name]  Procedure(s) Performed: Procedure(s) (LRB): EXAM UNDER ANESTHESIA (N/A) PLACEMENT OF SETON (N/A) BIOPSY OF RECTOVAGINAL MASS (N/A)  Patient location during evaluation: PACU Anesthesia Type: MAC Level of consciousness: awake and alert Pain management: pain level controlled Vital Signs Assessment: post-procedure vital signs reviewed and stable Respiratory status: spontaneous breathing, nonlabored ventilation, respiratory function stable and patient connected to nasal cannula oxygen Cardiovascular status: stable and blood pressure returned to baseline Anesthetic complications: no    Last Vitals:  Filed Vitals:   09/09/15 1524 09/09/15 1530  BP: 104/61 106/60  Pulse: 79 76  Temp: 36.4 C   Resp: 16 16    Last Pain: There were no vitals filed for this visit.               Zenaida Deed

## 2015-09-09 NOTE — Op Note (Signed)
09/09/2015  3:17 PM  PATIENT:  [name]  64 y.o. female  Patient Care Team: Caren Macadam, MD as PCP - General (Family Medicine)  PRE-OPERATIVE DIAGNOSIS:  Rectovaginal fistula  POST-OPERATIVE DIAGNOSIS:  Rectovaginal fistula  PROCEDURE:  EXAM UNDER ANESTHESIA PLACEMENT OF SETON BIOPSY OF RECTOVAGINAL MASS   Surgeon(s): Leighton Ruff, MD  ASSISTANT: none   ANESTHESIA:   local and MAC  SPECIMEN:  Source of Specimen:  rectovaginal septal mass  DISPOSITION OF SPECIMEN:  PATHOLOGY  COUNTS:  YES  PLAN OF CARE: Discharge to home after PACU  PATIENT DISPOSITION:  PACU - hemodynamically stable.  INDICATION: 64 y.o. F with rectovaginal fistula that developed over the last few months.   OR FINDINGS: Rectovaginal mass with associated rectovaginal fistula  DESCRIPTION: the patient was identified in the preoperative holding area and taken to the OR where they were laid on the operating room table.  MAC anesthesia was induced without difficulty. The patient was then positioned in lithotomy position.  The patient was then prepped and draped in usual sterile fashion.  SCDs were noted to be in place prior to the initiation of anesthesia. A surgical timeout was performed indicating the correct patient, procedure, positioning and need for preoperative antibiotics.  A rectal and posterior vaginal block was performed using Marcaine with epinephrine.    I began with a digital rectal exam.  There was an obvious rectovaginal septal mass spanning ~3cm.  I then placed a Hill-Ferguson anoscope into the anal canal and evaluated this completely.  There was an anterior fissure noted.  I was able to place an S shaped fistula probe into this and it exited the vagina.  I used a core needle through the rectovaginal skin to obtain multiple biopsies in multiple planes.  This was sent to pathology for further examination.  I placed a vessel loop through the fistula and tied this with 3 Ethibond sutures.  Hemostasis was good.  All counts were correct.  The patient was sent to the PACU in stable condition.

## 2015-09-09 NOTE — Interval H&P Note (Signed)
History and Physical Interval Note:  09/09/2015 2:08 PM  Abigail Wagner  has presented today for surgery, with the diagnosis of Rectovaginal fistula  The various methods of treatment have been discussed with the patient and family. After consideration of risks, benefits and other options for treatment, the patient has consented to  Procedure(s): EXAM UNDER ANESTHESIA (N/A) PLACEMENT OF SETON (N/A) BIOPSY OF RECTOVAGINAL MASS (N/A) as a surgical intervention .  The patient's history has been reviewed, patient examined, no change in status, stable for surgery.  I have reviewed the patient's chart and labs.  Questions were answered to the patient's satisfaction.     Rosario Adie, MD  [facility]

## 2015-09-09 NOTE — H&P (View-Only) (Signed)
History of Present Illness   Patient words: fistula.  The patient is a 64 year old female who presents with rectovaginal fistula. Patient presents to the office with complaints of stool per fragile enough for the past 3 months. She was seen by her gynecologist who is treating her ovarian cancer. A CT scan of the pelvis was ordered. This was done with rectal contrast. An obvious rectovaginal fistula was seen distally. She states that she has never had any anorectal surgeries before. She denies any history of pelvic radiation. She does remember having an abscess that was drained many years back. Patient is an active smoker and smokes approximately 1/2-1 pack a day. She has a history of recent UTI and is taking Cipro for this.      Allergies   No Known Drug Allergies 06/16/2014    Past Medical History   RECTOVAGINAL FISTULA (N82.3)   EPITHELIAL OVARIAN CANCER, FIGO STAGE IIIB (C56.9)   serous carcinoma of peritoneum    Family History   Depression: Daughter, Mother  Migraine Headache: Sister  Heart disease in female family member before age 59  Heart Disease: Brother, Father, Sister  Ischemic Bowel Disease: Mother  Hypertension: Father    Social History   Caffeine use: Carbonated beverages, Coffee  Alcohol use: Recently quit alcohol use  Tobacco use: Current every day smoker  Illicit drug use: Recently quit drug use    Medication History   B Complex ( Oral) Specific dose unknown - Active.  Voltaren (75MG  Tablet DR, Oral) Active.  Bentyl (10MG  Capsule, Oral) Active.  Depakote ER (250MG  Tablet ER 24HR, Oral) Active.  Lexapro (20MG  Tablet, Oral) Active.  Anusol-HC (2.5% Cream, Rectal) Active.  Metoprolol Tartrate (25MG  Tablet, Oral) Active.  Multiple Vitamin ( Oral) Active.  RA Krill Oil (500MG  Capsule, Oral) Active.  Ativan (1MG  Tablet, Oral) Active.  Medications Reconciled.  Altabax (1% Ointment, External) Active.  Vitamin C ER (500MG  Tablet ER, Oral) Active.  Estrace (2MG   Tablet, Oral) Active.  Folic Acid (0000000 Tablet, Oral) Active.  Norco (5-325MG  Tablet, Oral) Active.  Prevacid (15MG  Capsule DR, Oral) Active.  ValACYclovir HCl (1GM Tablet, Oral) Active.    Pregnancy/Birth   Age at menarche: 31 years  Para  3  Contraceptive History: Oral contraceptives  Age of menopause  70-60  Maternal age  7-20  Gravida  3    Other Problems   Hemorrhoids  Gastroesophageal Reflux Disease  Kidney Stone  High blood pressure  Depression  Alcohol Abuse  Arthritis  Anxiety Disorder    Past Surgical   Hysterectomy (not due to cancer) - Partial  Cesarean Section - 1  Knee Surgery: Left  Breast Augmentation: Bilateral    Diagnostic Studies   Mammogram; 1-3 years ago  Colonoscopy; 1-5 years ago  Pap Smear; 1-5 years ago     Vitals   08/31/2015 11:25 AM   Weight: 144 lb Height: 65.5 in   Body Surface Area: 1.73 m Body Mass Index: 23.6 kg/m     Temp.: 97.5 F Pulse: 78 (Regular)   BP: 126/78 Manual (Sitting, Left Arm, Standard)     Physical Exam  The physical exam findings are as follows:  General   Pleasant. Looks good. No distress. Husband is with her.   Head and Neck   Neck reveals no adenopathy or mass. Good range of motion. Clavicles normal without deformity.   Chest and Lung Exam   Clear to auscultation bilaterally   Cardiovascular   regular rate and rhythm. No  murmur. No ectopy.   Abdomen   Soft. Not distended. Portin right upper quadrant without infection. Lower midline incision well healed.   Female Genitourinary Rectovaginal Exam - Mass - Present.   Rectal Anorectal Exam - External - normal external exam. Internal - Note: Mass palpated anteriorly.   Neurologic Neurologic evaluation reveals - alert and oriented x 3 with no impairment of recent or remote memory.    Assessment & Plan  RECTOVAGINAL FISTULA (N82.3)  Today's Impression: 64 year old female recently treated for ovarian cancer. She was noted to have vaginal  drainage over the last 3 months. CT scan of the pelvis demonstrates a rectovaginal fistula with contrast extravasating into the distal vagina. On exam she has a mass located at the distal rectovaginal septum. I do not see any obvious signs of carcinoma currently, but I am suspicious that this is the cause of her fistula. I have recommended an exam under anesthesia with biopsy of the mass. We will plan on placing a seton through the fistula tract as well. We will then allow the inflammation to calm down and confirm that this is not a cancer that we are dealing with. I will then take her back to the operating room and perform a primary repair of her fistula once the inflammation has calmed down and she has quit smoking.

## 2015-09-09 NOTE — Transfer of Care (Signed)
Immediate Anesthesia Transfer of Care Note  Patient: Abigail Wagner  Procedure(s) Performed: Procedure(s) (LRB): EXAM UNDER ANESTHESIA (N/A) PLACEMENT OF SETON (N/A) BIOPSY OF RECTOVAGINAL MASS (N/A)  Patient Location: PACU  Anesthesia Type: MAC  Level of Consciousness: awake, alert , oriented and patient cooperative  Airway & Oxygen Therapy: Patient Spontanous Breathing and Patient connected to face mask oxygen  Post-op Assessment: Report given to PACU RN and Post -op Vital signs reviewed and stable  Post vital signs: Reviewed and stable  Complications: No apparent anesthesia complications

## 2015-09-09 NOTE — Discharge Instructions (Addendum)

## 2015-09-09 NOTE — Anesthesia Preprocedure Evaluation (Addendum)
Anesthesia Evaluation  Patient identified by MRN, date of birth, ID band Patient awake    Reviewed: Allergy & Precautions, NPO status , Patient's Chart, lab work & pertinent test results  History of Anesthesia Complications (+) PONV  Airway Mallampati: II  TM Distance: >3 FB Neck ROM: Full    Dental no notable dental hx. (+) Dental Advisory Given   Pulmonary Current Smoker,    Pulmonary exam normal breath sounds clear to auscultation       Cardiovascular hypertension,  Rhythm:Regular Rate:Normal     Neuro/Psych    GI/Hepatic Neg liver ROS, GERD  ,  Endo/Other    Renal/GU Renal disease     Musculoskeletal   Abdominal   Peds  Hematology   Anesthesia Other Findings   Reproductive/Obstetrics                           Anesthesia Physical Anesthesia Plan  ASA: III  Anesthesia Plan: MAC   Post-op Pain Management:    Induction: Intravenous  Airway Management Planned: Simple Face Mask  Additional Equipment:   Intra-op Plan:   Post-operative Plan:   Informed Consent: I have reviewed the patients History and Physical, chart, labs and discussed the procedure including the risks, benefits and alternatives for the proposed anesthesia with the patient or authorized representative who has indicated his/her understanding and acceptance.   Dental advisory given  Plan Discussed with: CRNA and Anesthesiologist  Anesthesia Plan Comments:        Anesthesia Quick Evaluation

## 2015-09-10 ENCOUNTER — Encounter (HOSPITAL_BASED_OUTPATIENT_CLINIC_OR_DEPARTMENT_OTHER): Payer: Self-pay | Admitting: General Surgery

## 2015-09-13 ENCOUNTER — Telehealth: Payer: Self-pay | Admitting: *Deleted

## 2015-09-13 ENCOUNTER — Telehealth: Payer: Self-pay | Admitting: General Surgery

## 2015-09-13 NOTE — Telephone Encounter (Signed)
Confirmed with Dr. Reynaldo Minium office that he will treat her new diagnosis anal cancer. If she needs radiation therapy also, referral will need to be for medical oncology and radiation therapy. [place]: All RT goes to [place] or [place].

## 2015-09-13 NOTE — Telephone Encounter (Signed)
Attempted to call and discuss path report.  Left VM for pt to call the office.

## 2015-09-16 ENCOUNTER — Other Ambulatory Visit: Payer: Self-pay | Admitting: Adult Health

## 2015-09-16 ENCOUNTER — Telehealth: Payer: Self-pay | Admitting: *Deleted

## 2015-09-16 ENCOUNTER — Telehealth: Payer: Self-pay | Admitting: Obstetrics & Gynecology

## 2015-09-16 NOTE — Telephone Encounter (Signed)
Pt called stating that she needs a refill of her medication, Anderson Malta is out for vacation and she would like another doctor to fill it. Please contact pt

## 2015-09-16 NOTE — Telephone Encounter (Signed)
  Oncology Nurse Navigator Documentation              Interventions: Coordination of Care (09/16/15 1108): confirmed that she has appointment on 10/12/15 at 11:00 to see [facility] @ liver clinic. She saw Dr. Charlean Sanfilippo at [facility] on 08/30/15.   Coordination of Care: MD Appointments (09/16/15 1108):No show today. POF to reschedule visit with Dr. Burr Medico in January after she sees liver care clinic NP.

## 2015-09-17 ENCOUNTER — Telehealth: Payer: Self-pay | Admitting: Obstetrics & Gynecology

## 2015-09-17 MED ORDER — LEVOFLOXACIN 500 MG PO TABS
500.0000 mg | ORAL_TABLET | Freq: Every day | ORAL | Status: DC
Start: 2015-09-17 — End: 2015-09-29

## 2015-09-17 NOTE — Telephone Encounter (Signed)
Pt requesting refill on Cipro for UTI, urine culture 11/08 - Ecoli, complete Cipro Jennifer prescribed.   C/o burning with urination. Pt states "something just don't feel right."

## 2015-09-21 ENCOUNTER — Other Ambulatory Visit (HOSPITAL_COMMUNITY): Payer: Self-pay | Admitting: General Surgery

## 2015-09-21 DIAGNOSIS — C210 Malignant neoplasm of anus, unspecified: Secondary | ICD-10-CM

## 2015-09-22 ENCOUNTER — Other Ambulatory Visit (HOSPITAL_COMMUNITY): Payer: Self-pay | Admitting: General Surgery

## 2015-09-22 ENCOUNTER — Ambulatory Visit (HOSPITAL_COMMUNITY)
Admission: RE | Admit: 2015-09-22 | Discharge: 2015-09-22 | Disposition: A | Discharge status: Home / Self Care | Payer: 59 | Source: Ambulatory Visit | Attending: General Surgery | Admitting: General Surgery

## 2015-09-22 DIAGNOSIS — Z9221 Personal history of antineoplastic chemotherapy: Secondary | ICD-10-CM | POA: Diagnosis not present

## 2015-09-22 DIAGNOSIS — Z90710 Acquired absence of both cervix and uterus: Secondary | ICD-10-CM | POA: Insufficient documentation

## 2015-09-22 DIAGNOSIS — N8110 Cystocele, unspecified: Secondary | ICD-10-CM | POA: Diagnosis not present

## 2015-09-22 DIAGNOSIS — C210 Malignant neoplasm of anus, unspecified: Secondary | ICD-10-CM

## 2015-09-22 DIAGNOSIS — Z8543 Personal history of malignant neoplasm of ovary: Secondary | ICD-10-CM | POA: Insufficient documentation

## 2015-09-22 LAB — POCT I-STAT CREATININE: CREATININE: 0.6 mg/dL (ref 0.44–1.00)

## 2015-09-22 MED ORDER — IOHEXOL 300 MG/ML  SOLN
100.0000 mL | Freq: Once | INTRAMUSCULAR | Status: AC | PRN
  Administered 2015-09-22: 100 mL via INTRAVENOUS

## 2015-09-29 ENCOUNTER — Other Ambulatory Visit (INDEPENDENT_AMBULATORY_CARE_PROVIDER_SITE_OTHER): Payer: Self-pay | Admitting: Internal Medicine

## 2015-09-29 ENCOUNTER — Encounter (INDEPENDENT_AMBULATORY_CARE_PROVIDER_SITE_OTHER): Payer: Self-pay | Admitting: Internal Medicine

## 2015-09-29 ENCOUNTER — Ambulatory Visit (INDEPENDENT_AMBULATORY_CARE_PROVIDER_SITE_OTHER): Payer: 59 | Admitting: Internal Medicine

## 2015-09-29 ENCOUNTER — Telehealth (INDEPENDENT_AMBULATORY_CARE_PROVIDER_SITE_OTHER): Payer: Self-pay | Admitting: *Deleted

## 2015-09-29 VITALS — BP 110/60 | HR 76 | Temp 97.8°F | Ht 65.5 in | Wt 142.6 lb

## 2015-09-29 DIAGNOSIS — C210 Malignant neoplasm of anus, unspecified: Secondary | ICD-10-CM

## 2015-09-29 DIAGNOSIS — Z1211 Encounter for screening for malignant neoplasm of colon: Secondary | ICD-10-CM

## 2015-09-29 NOTE — Patient Instructions (Signed)
The risks and benefits such as perforation, bleeding, and infection were reviewed with the patient and is agreeable. 

## 2015-09-29 NOTE — Telephone Encounter (Signed)
Patient needs trilyte 

## 2015-09-29 NOTE — Progress Notes (Signed)
Subjective:    Patient ID: Abigail Wagner, female    DOB: 1951-09-17, 65 y.o.   MRN: [id number]  HPI Referred by [facility]   Dr Leighton Ruff for colonoscopy.  Patient tells me she has anal cancer. Hx of rectovaginal fistula repair12/16/2016. Biopsy: Soft tissue mass, rectovaginal septal mass: Squamous cell carcinoma.  There had been no change in her stools. She does tell me before the repair of the fistula she was having some stool thru her vagina. She had had symptoms since October. Hx of ovarian cancer in 2015. Hysterectomy and chemo.  Appetite is good. No recent weight loss. No melena or BRRB. No rectal pain with her BMs. Usually has a BM daily.      09/22/2015 CT abdomen/pelvis with CM:     CLINICAL DATA: Newly diagnosed anal carcinoma. Two weeks postop from rectovaginal fistula repair. Personal history of ovarian carcinoma treated with surgery and chemotherapy.   IMPRESSION: No evidence of metastatic disease within the chest, abdomen, or pelvis.  Prior hysterectomy. Mild pelvic floor relaxation with cystocele again noted.          11/30/2006 Colonoscopy.  INDICATIONS: [name] is a 65 year old Caucasian female with a recent bout of diarrhea and hematochezia. She states that her diarrhea has resolved. She is undergoing colonoscopy both for diagnostic and screening purposes. Family history is negative for colorectal carcinoma.         Review of Systems Past Medical History  Diagnosis Date  . IBS (irritable bowel syndrome)     takes Bentyl daily  . HTN (hypertension)     takes Metoprolol daily  . Smokers' cough ([place])   . Headache(784.0)   . History of kidney stones   . Prolapse of vaginal vault after hysterectomy 07/20/2015  . Rectal mass 08/03/2015  . Rectovaginal fistula 08/05/2015  . Major depression ([place])   . Mood disorder ([place])   . History of suicide attempt     per documentation in epic  05-30-2009  overdose  benaodiazepine  . History of acute respiratory failure     05-30-2009  drug overdose-- (intubated for 2 days)  and aspiration pneumonia  . Sigmoid diverticulosis   . Carcinoma of ovary, stage 3 [facility]) oncologist-  dr gehrig/  dr Doreene Eland (cancer center in [place] w/ [facility])--  no recurrency    dx 09/ 2015  Stage IIIB  papillary ovarian carcinoma  s/p  omentectomy and BSO &  chemotherapy (completed 12-07-2014)  . GERD (gastroesophageal reflux disease)   . DDD (degenerative disc disease), lumbosacral   . OA (osteoarthritis)   . History of herpes genitalis     Past Surgical History  Procedure Laterality Date  . Cesarean section  1978  . Breast enhancement surgery  1986  . Knee arthroscopy Left 09-22-2004  . Colonoscopy    . Laparoscopy N/A 06/02/2014    Procedure: DIAGNOSTIC LAPAROSCOPY, OMENTAL BIOPSY, RIGHT OVARY BIOPSY, LYSIS OF ADHESIONS;  Surgeon: Fanny Skates, MD;  Location: [place];  Service: General;  Laterality: N/A;  . Portacath placement Right 08/17/2014    Procedure:  PLACE NEW PORT A CATH;  Surgeon: Fanny Skates, MD;  Location: [place];  Service: General;  Laterality: Right;  . Port-a-cath removal Right 08/17/2014    Procedure: REMOVAL INTRAPERITONEAL CHEMO PORT;  Surgeon: Fanny Skates, MD;  Location: [place];  Service: General;  Laterality: Right;  . Vaginal hysterectomy  1981  . Exploratory laparotomy/ omentectomy/  bilateral salpingoophorectomy/  port-a-cath placement  07-03-2014  [place]  . Tubal ligation  YRS AGO  . Placement of seton N/A 09/09/2015    Procedure: PLACEMENT OF SETON;  Surgeon: Leighton Ruff, MD;  Location: [facility];  Service: General;  Laterality: N/A;  . Rectal biopsy N/A 09/09/2015    Procedure: BIOPSY OF RECTOVAGINAL MASS;  Surgeon: Leighton Ruff, MD;  Location: [facility];  Service: General;  Laterality: N/A;    No Known Allergies  Current Outpatient Prescriptions on File  Prior to Visit  Medication Sig Dispense Refill  . Aspirin-Salicylamide-Caffeine (BC HEADACHE POWDER PO) Take 1 packet by mouth as needed.     Marland Kitchen b complex vitamins tablet Take 1 tablet by mouth daily.    . diclofenac (VOLTAREN) 75 MG EC tablet Take 75 mg by mouth 2 (two) times daily.     Marland Kitchen dicyclomine (BENTYL) 10 MG capsule Take 10 mg by mouth as needed.     . divalproex (DEPAKOTE) 250 MG DR tablet Take 1 tablet (250 mg total) by mouth every evening. 90 tablet 1  . escitalopram (LEXAPRO) 20 MG tablet Take 1 tablet (20 mg total) by mouth every morning. 90 tablet 1  . HYDROcodone-acetaminophen (NORCO/VICODIN) 5-325 MG tablet Take 1-2 tablets by mouth every 6 (six) hours as needed. 30 tablet 0  . hydrocortisone (PROCTOSOL HC) 2.5 % rectal cream Place rectally as needed. 28.35 g 0  . KRILL OIL PO Take 350 mg by mouth daily.    . lansoprazole (PREVACID) 15 MG capsule Take 15 mg by mouth as needed.     Marland Kitchen LORazepam (ATIVAN) 1 MG tablet Take 1 tablet (1 mg total) by mouth 2 (two) times daily. 60 tablet 3  . metoprolol tartrate (LOPRESSOR) 25 MG tablet Take 25 mg by mouth 2 (two) times daily.     . metroNIDAZOLE (METROGEL) 0.75 % vaginal gel PLACE 1 APPLICATORFUL VAGINALLY AT BEDTIME PRN 70 g 2  . Multiple Vitamin (MULTIVITAMIN) tablet Take 1 tablet by mouth daily.     No current facility-administered medications on file prior to visit.        Objective:   Physical Exam Blood pressure 110/60, pulse 76, temperature 97.8 F (36.6 C), height 5' 5.5" (1.664 m), weight 142 lb 9.6 oz (64.683 kg).  Alert and oriented. Skin warm and dry. Oral mucosa is moist.   . Sclera anicteric, conjunctivae is pink. Thyroid not enlarged. No cervical lymphadenopathy. Lungs clear. Heart regular rate and rhythm.  Abdomen is soft. Bowel sounds are positive. No hepatomegaly. No abdominal masses felt. No tenderness.  No edema to lower extremities.  Rectal deferred.     Assessment & Plan:  Anal cancer. Patient needs  colonoscopy. Colonic neoplasm needs to be ruled out.

## 2015-10-01 MED ORDER — PEG 3350-KCL-NA BICARB-NACL 420 G PO SOLR: 4000.0000 mL | Freq: Once | ORAL | Status: DC

## 2015-10-07 ENCOUNTER — Encounter (HOSPITAL_COMMUNITY)
Admission: RE | Disposition: A | Discharge status: Home / Self Care | Payer: Self-pay | Source: Ambulatory Visit | Attending: Internal Medicine

## 2015-10-07 ENCOUNTER — Ambulatory Visit (HOSPITAL_COMMUNITY)
Admission: RE | Admit: 2015-10-07 | Discharge: 2015-10-07 | Disposition: A | Discharge status: Home / Self Care | Payer: 59 | Source: Ambulatory Visit | Attending: Internal Medicine | Admitting: Internal Medicine

## 2015-10-07 ENCOUNTER — Encounter (HOSPITAL_COMMUNITY): Payer: Self-pay | Admitting: *Deleted

## 2015-10-07 DIAGNOSIS — K5730 Diverticulosis of large intestine without perforation or abscess without bleeding: Secondary | ICD-10-CM | POA: Diagnosis not present

## 2015-10-07 DIAGNOSIS — C210 Malignant neoplasm of anus, unspecified: Secondary | ICD-10-CM | POA: Diagnosis not present

## 2015-10-07 DIAGNOSIS — I10 Essential (primary) hypertension: Secondary | ICD-10-CM | POA: Insufficient documentation

## 2015-10-07 DIAGNOSIS — D125 Benign neoplasm of sigmoid colon: Secondary | ICD-10-CM | POA: Diagnosis not present

## 2015-10-07 DIAGNOSIS — Z7982 Long term (current) use of aspirin: Secondary | ICD-10-CM | POA: Diagnosis not present

## 2015-10-07 DIAGNOSIS — Z79899 Other long term (current) drug therapy: Secondary | ICD-10-CM | POA: Insufficient documentation

## 2015-10-07 DIAGNOSIS — K589 Irritable bowel syndrome without diarrhea: Secondary | ICD-10-CM | POA: Diagnosis not present

## 2015-10-07 DIAGNOSIS — K219 Gastro-esophageal reflux disease without esophagitis: Secondary | ICD-10-CM | POA: Diagnosis not present

## 2015-10-07 DIAGNOSIS — F329 Major depressive disorder, single episode, unspecified: Secondary | ICD-10-CM | POA: Insufficient documentation

## 2015-10-07 DIAGNOSIS — Z8543 Personal history of malignant neoplasm of ovary: Secondary | ICD-10-CM | POA: Insufficient documentation

## 2015-10-07 DIAGNOSIS — M1990 Unspecified osteoarthritis, unspecified site: Secondary | ICD-10-CM | POA: Insufficient documentation

## 2015-10-07 DIAGNOSIS — C211 Malignant neoplasm of anal canal: Secondary | ICD-10-CM | POA: Diagnosis not present

## 2015-10-07 DIAGNOSIS — Z1211 Encounter for screening for malignant neoplasm of colon: Secondary | ICD-10-CM | POA: Diagnosis not present

## 2015-10-07 DIAGNOSIS — Z86010 Personal history of colonic polyps: Secondary | ICD-10-CM

## 2015-10-07 DIAGNOSIS — R198 Other specified symptoms and signs involving the digestive system and abdomen: Secondary | ICD-10-CM | POA: Diagnosis not present

## 2015-10-07 DIAGNOSIS — Z860101 Personal history of adenomatous and serrated colon polyps: Secondary | ICD-10-CM

## 2015-10-07 HISTORY — PX: COLONOSCOPY: SHX5424

## 2015-10-07 HISTORY — DX: Personal history of adenomatous and serrated colon polyps: Z86.0101

## 2015-10-07 HISTORY — DX: Personal history of colonic polyps: Z86.010

## 2015-10-07 SURGERY — COLONOSCOPY
Anesthesia: Moderate Sedation

## 2015-10-07 MED ORDER — MIDAZOLAM HCL 5 MG/5ML IJ SOLN
INTRAMUSCULAR | Status: DC | PRN
  Administered 2015-10-07 (×6): 2 mg via INTRAVENOUS

## 2015-10-07 MED ORDER — MEPERIDINE HCL 50 MG/ML IJ SOLN
INTRAMUSCULAR | Status: AC
  Filled 2015-10-07: qty 1

## 2015-10-07 MED ORDER — MEPERIDINE HCL 50 MG/ML IJ SOLN
INTRAMUSCULAR | Status: DC | PRN
  Administered 2015-10-07 (×3): 25 mg via INTRAVENOUS

## 2015-10-07 MED ORDER — MIDAZOLAM HCL 5 MG/5ML IJ SOLN
INTRAMUSCULAR | Status: AC
  Filled 2015-10-07: qty 5

## 2015-10-07 MED ORDER — SODIUM CHLORIDE 0.9 % IV SOLN
INTRAVENOUS | Status: DC
  Administered 2015-10-07: 1000 mL via INTRAVENOUS

## 2015-10-07 MED ORDER — STERILE WATER FOR IRRIGATION IR SOLN
Status: DC | PRN
  Administered 2015-10-07: 2.5 mL

## 2015-10-07 MED ORDER — MIDAZOLAM HCL 5 MG/5ML IJ SOLN
INTRAMUSCULAR | Status: AC
  Filled 2015-10-07: qty 10

## 2015-10-07 NOTE — Discharge Instructions (Signed)
No aspirin for 1 week. Hold diclofenac for 2 days. Resume other medications and diet as before. No driving for 24 hours. Physician will call with biopsy results.  Colonoscopy, Care After These instructions give you information on caring for yourself after your procedure. Your doctor may also give you more specific instructions. Call your doctor if you have any problems or questions after your procedure. HOME CARE  Do not drive for 24 hours.  Do not sign important papers or use machinery for 24 hours.  You may shower.  You may go back to your usual activities, but go slower for the first 24 hours.  Take rest breaks often during the first 24 hours.  Walk around or use warm packs on your belly (abdomen) if you have belly cramping or gas.  Drink enough fluids to keep your pee (urine) clear or pale yellow.  Resume your normal diet. Avoid heavy or fried foods.  Avoid drinking alcohol for 24 hours or as told by your doctor.  Only take medicines as told by your doctor. If a tissue sample (biopsy) was taken during the procedure:   Do not take aspirin or blood thinners for 7 days, or as told by your doctor.  Do not drink alcohol for 7 days, or as told by your doctor.  Eat soft foods for the first 24 hours. GET HELP IF: You still have a small amount of blood in your poop (stool) 2-3 days after the procedure. GET HELP RIGHT AWAY IF:  You have more than a small amount of blood in your poop.  You see clumps of tissue (blood clots) in your poop.  Your belly is puffy (swollen).  You feel sick to your stomach (nauseous) or throw up (vomit).  You have a fever.  You have belly pain that gets worse and medicine does not help. MAKE SURE YOU:  Understand these instructions.  Will watch your condition.  Will get help right away if you are not doing well or get worse.   This information is not intended to replace advice given to you by your health care provider. Make sure you  discuss any questions you have with your health care provider.   Document Released: 10/14/2010 Document Revised: 09/16/2013 Document Reviewed: 05/19/2013 Elsevier Interactive Patient Education 2016 Elsevier Inc.  Colon Polyps Polyps are lumps of extra tissue growing inside the body. Polyps can grow in the large intestine (colon). Most colon polyps are noncancerous (benign). However, some colon polyps can become cancerous over time. Polyps that are larger than a pea may be harmful. To be safe, caregivers remove and test all polyps. CAUSES  Polyps form when mutations in the genes cause your cells to grow and divide even though no more tissue is needed. RISK FACTORS There are a number of risk factors that can increase your chances of getting colon polyps. They include:  Being older than 50 years.  Family history of colon polyps or colon cancer.  Long-term colon diseases, such as colitis or Crohn disease.  Being overweight.  Smoking.  Being inactive.  Drinking too much alcohol. SYMPTOMS  Most small polyps do not cause symptoms. If symptoms are present, they may include:  Blood in the stool. The stool may look dark red or black.  Constipation or diarrhea that lasts longer than 1 week. DIAGNOSIS People often do not know they have polyps until their caregiver finds them during a regular checkup. Your caregiver can use 4 tests to check for polyps:  Digital rectal  exam. The caregiver wears gloves and feels inside the rectum. This test would find polyps only in the rectum.  Barium enema. The caregiver puts a liquid called barium into your rectum before taking X-rays of your colon. Barium makes your colon look white. Polyps are dark, so they are easy to see in the X-ray pictures.  Sigmoidoscopy. A thin, flexible tube (sigmoidoscope) is placed into your rectum. The sigmoidoscope has a light and tiny camera in it. The caregiver uses the sigmoidoscope to look at the last third of your  colon.  Colonoscopy. This test is like sigmoidoscopy, but the caregiver looks at the entire colon. This is the most common method for finding and removing polyps. TREATMENT  Any polyps will be removed during a sigmoidoscopy or colonoscopy. The polyps are then tested for cancer. PREVENTION  To help lower your risk of getting more colon polyps:  Eat plenty of fruits and vegetables. Avoid eating fatty foods.  Do not smoke.  Avoid drinking alcohol.  Exercise every day.  Lose weight if recommended by your caregiver.  Eat plenty of calcium and folate. Foods that are rich in calcium include milk, cheese, and broccoli. Foods that are rich in folate include chickpeas, kidney beans, and spinach. HOME CARE INSTRUCTIONS Keep all follow-up appointments as directed by your caregiver. You may need periodic exams to check for polyps. SEEK MEDICAL CARE IF: You notice bleeding during a bowel movement.   This information is not intended to replace advice given to you by your health care provider. Make sure you discuss any questions you have with your health care provider.   Document Released: 06/07/2004 Document Revised: 10/02/2014 Document Reviewed: 11/21/2011 Elsevier Interactive Patient Education [employer].

## 2015-10-07 NOTE — Op Note (Addendum)
COLONOSCOPY PROCEDURE REPORT  PATIENT:  Abigail Wagner  MR#:  [id number] Birthdate:  May 27, 1951, 65 y.o., female Endoscopist:  Dr. Rogene Houston, MD Referred By:  Dr. Dr. Leighton Ruff, MD  Procedure Date: 10/07/2015  Procedure:   Colonoscopy with snare polypectomy.  Indications:  Patient is 65 year old Caucasian female was undergoing screening colonoscopy. She was recently diagnosed with squamous cell carcinoma of anal canal when she presented with vaginal discharge. She underwent examination under anesthesia by Dr. Trudie Reed noted to have rectovaginal mass associated with fistula. Abigail Wagner was placed at the time of procedure. She is scheduled to see Dr.Darovski for further treatment. She denies rectal bleeding or change in her bowel habits. Last colonoscopy was normal in March 2008. Family history is negative for CRC.  Informed Consent:  The procedure and risks were reviewed with the patient and informed consent was obtained.  Medications:  Demerol 75 mg IV Versed 12 mg IV   first dose administered at 7:43 AM  Last dose administered at 8:04 AM  procedure ended-scope out at 8:20 AM.   Description of procedure:  After a digital rectal exam was performed, that colonoscope was advanced from the anus through the rectum and colon to the area of the cecum, ileocecal valve and appendiceal orifice. The cecum was deeply intubated. These structures were well-seen and photographed for the record. From the level of the cecum and ileocecal valve, the scope was slowly and cautiously withdrawn. The mucosal surfaces were carefully surveyed utilizing scope tip to flexion to facilitate fold flattening as needed. The scope was pulled down into the rectum where a thorough exam including retroflexion was performed. UltraSling scope was also used for this procedure.  Findings:   Prep excellent. 7 mm polyp hot snared from distal sigmoid colon. 2 small diverticula at sigmoid colon. Noncompliant sigmoid  colon. Normal rectal mucosa. Segment of seton seen on retroflexed view in the anal canal. Small hemorrhoids below the dentate line. Hard mass noted on external exam anterior to anal orifice with seton in place.   Therapeutic/Diagnostic Maneuvers Performed:  See above  Complications:  None  EBL: None  Cecal Withdrawal Time:  11 minutes  Impression:  Examination performed to cecum. 7 mm polyp hot snared from distal sigmoid colon. Small diverticula at sigmoid colon. Mass noted anterior to anal orifice with seton in place without involvement of distal rectal mucosa(known squamous cell carcinoma of anal canal)  Recommendations:  Standard instructions given. No aspirin for 1 week. No NSAIDs for 2 days. I will contact patient with biopsy results and further recommendations.  Abigail Wagner U  10/07/2015 8:26 AM  CC: Dr. Ethlyn Gallery, Kassie Mends, MD & Dr. Rayne Du ref. provider found CC: Dr. Leighton Ruff, MD

## 2015-10-07 NOTE — H&P (Signed)
Abigail Wagner is an 65 y.o. female.   Chief Complaint: Patient is here for colonoscopy. HPI: Patient is 65 year old Caucasian female who was recently diagnosed with squamous cell carcinoma of anal canal when she presented with vaginal discharge. She underwent examination under anesthesia by Dr. Leighton Ruff and found to have fistula and the mass. Biopsy revealed squamous cell carcinoma. Patient was advised to undergo colonoscopy. She is scheduled to see oncologist in the future. Past history is also significant for ovarian carcinoma which she was treated over a year ago and remains in remission. She denies rectal bleeding diarrhea or constipation. Last colonoscopy was normal in March 2008. Family history is negative for CRC.  Past Medical History  Diagnosis Date  . IBS (irritable bowel syndrome)     takes Bentyl daily  . HTN (hypertension)     takes Metoprolol daily  . Smokers' cough ([place])   . Headache(784.0)   . History of kidney stones   . Prolapse of vaginal vault after hysterectomy 07/20/2015  . Rectal mass 08/03/2015  . Rectovaginal fistula 08/05/2015  . Major depression ([place])   . Mood disorder ([place])   . History of suicide attempt     per documentation in epic  05-30-2009  overdose benaodiazepine  . History of acute respiratory failure     05-30-2009  drug overdose-- (intubated for 2 days)  and aspiration pneumonia  . Sigmoid diverticulosis   . Carcinoma of ovary, stage 3 [facility]) oncologist-  dr gehrig/  dr Doreene Eland (cancer center in [place] w/ [facility])--  no recurrency    dx 09/ 2015  Stage IIIB  papillary ovarian carcinoma  s/p  omentectomy and BSO &  chemotherapy (completed 12-07-2014)  . GERD (gastroesophageal reflux disease)   . History of herpes genitalis   . DDD (degenerative disc disease), lumbosacral   . OA (osteoarthritis)     Past Surgical History  Procedure Laterality Date  . Cesarean section  1978  . Breast enhancement surgery  1986  . Knee arthroscopy Left 09-22-2004   . Colonoscopy    . Laparoscopy N/A 06/02/2014    Procedure: DIAGNOSTIC LAPAROSCOPY, OMENTAL BIOPSY, RIGHT OVARY BIOPSY, LYSIS OF ADHESIONS;  Surgeon: Fanny Skates, MD;  Location: [place];  Service: General;  Laterality: N/A;  . Portacath placement Right 08/17/2014    Procedure:  PLACE NEW PORT A CATH;  Surgeon: Fanny Skates, MD;  Location: [place];  Service: General;  Laterality: Right;  . Port-a-cath removal Right 08/17/2014    Procedure: REMOVAL INTRAPERITONEAL CHEMO PORT;  Surgeon: Fanny Skates, MD;  Location: [place];  Service: General;  Laterality: Right;  . Vaginal hysterectomy  1981  . Exploratory laparotomy/ omentectomy/  bilateral salpingoophorectomy/  port-a-cath placement  07-03-2014   [facility]  . Tubal ligation  YRS AGO  . Placement of seton N/A 09/09/2015    Procedure: PLACEMENT OF SETON;  Surgeon: Leighton Ruff, MD;  Location: [facility];  Service: General;  Laterality: N/A;  . Rectal biopsy N/A 09/09/2015    Procedure: BIOPSY OF RECTOVAGINAL MASS;  Surgeon: Leighton Ruff, MD;  Location: [place];  Service: General;  Laterality: N/A;    Family History  Problem Relation Age of Onset  . Bipolar disorder Mother   . Anxiety disorder Mother   . Dementia Mother   . Alcohol abuse Paternal Uncle   . Bipolar disorder Maternal Grandmother   . Dementia Maternal Grandmother   . ADD / ADHD Neg Hx   .  Drug abuse Neg Hx   . Depression Neg Hx   . OCD Neg Hx   . Paranoid behavior Neg Hx   . Schizophrenia Neg Hx   . Seizures Neg Hx   . Sexual abuse Neg Hx   . Physical abuse Neg Hx   . Alcohol abuse Paternal Uncle   . Stroke Brother   . Deep vein thrombosis Son    Social History:  reports that she has been smoking Cigarettes.  She has a 43 pack-year smoking history. She has never used smokeless tobacco. She reports that she does not drink alcohol or use illicit drugs.  Allergies: No Known  Allergies  Medications Prior to Admission  Medication Sig Dispense Refill  . b complex vitamins tablet Take 1 tablet by mouth daily.    . diclofenac (VOLTAREN) 75 MG EC tablet Take 75 mg by mouth 2 (two) times daily.     . divalproex (DEPAKOTE) 250 MG DR tablet Take 1 tablet (250 mg total) by mouth every evening. 90 tablet 1  . escitalopram (LEXAPRO) 20 MG tablet Take 1 tablet (20 mg total) by mouth every morning. 90 tablet 1  . HYDROcodone-acetaminophen (NORCO/VICODIN) 5-325 MG tablet Take 1-2 tablets by mouth every 6 (six) hours as needed. 30 tablet 0  . hydrocortisone (PROCTOSOL HC) 2.5 % rectal cream Place rectally as needed. 28.35 g 0  . KRILL OIL PO Take 350 mg by mouth daily.    . lansoprazole (PREVACID) 15 MG capsule Take 15 mg by mouth as needed.     Marland Kitchen LORazepam (ATIVAN) 1 MG tablet Take 1 tablet (1 mg total) by mouth 2 (two) times daily. 60 tablet 3  . metoprolol tartrate (LOPRESSOR) 25 MG tablet Take 25 mg by mouth 2 (two) times daily.     . metroNIDAZOLE (METROGEL) 0.75 % vaginal gel PLACE 1 APPLICATORFUL VAGINALLY AT BEDTIME PRN 70 g 2  . Multiple Vitamin (MULTIVITAMIN) tablet Take 1 tablet by mouth daily.    . polyethylene glycol-electrolytes (NULYTELY/GOLYTELY) 420 g solution Take 4,000 mLs by mouth once. 4000 mL 0  . Aspirin-Salicylamide-Caffeine (BC HEADACHE POWDER PO) Take 1 packet by mouth as needed.     . dicyclomine (BENTYL) 10 MG capsule Take 10 mg by mouth as needed.       No results found for this or any previous visit (from the past 48 hour(s)). No results found.  ROS  Blood pressure 121/61, pulse 75, temperature 97.7 F (36.5 C), temperature source Oral, resp. rate 18, SpO2 98 %. Physical Exam  Constitutional: She appears well-developed and well-nourished.  HENT:  Mouth/Throat: Oropharynx is clear and moist.  Eyes: Conjunctivae are normal. No scleral icterus.  Neck: No thyromegaly present.  Cardiovascular: Normal rate, regular rhythm and normal heart  sounds.   No murmur heard. Respiratory: Effort normal and breath sounds normal.  GI: Soft. She exhibits no distension and no mass. There is no tenderness.  Musculoskeletal: She exhibits no edema.  Lymphadenopathy:    She has no cervical adenopathy.  Neurological: She is alert.  Skin: Skin is warm and dry.     Assessment/Plan Colonoscopy primarily for screening purpose. Recent diagnosis of squamous cell carcinoma of anal canal with fistula.  Abigail Wagner U 10/07/2015, 7:36 AM

## 2015-10-11 ENCOUNTER — Telehealth: Payer: Self-pay | Admitting: *Deleted

## 2015-10-11 ENCOUNTER — Encounter (HOSPITAL_COMMUNITY): Payer: Self-pay | Admitting: Internal Medicine

## 2015-10-11 NOTE — Telephone Encounter (Signed)
Left VM at [facility]: radiology oncology dept with information for new referral for anal cancer per Dr. Leighton Ruff at [place]. All records should be with Dr. Janae Sauce, who she sees on 10/14/15 at 0830. Requested return call to confirm the referral was received and acted on. Also sent message to Earleen Newport, who scheduled for rad onc to see if she can assist.

## 2015-10-14 ENCOUNTER — Other Ambulatory Visit: Payer: Self-pay | Admitting: Radiation Oncology

## 2015-10-14 DIAGNOSIS — Z5111 Encounter for antineoplastic chemotherapy: Secondary | ICD-10-CM | POA: Insufficient documentation

## 2015-10-14 DIAGNOSIS — C210 Malignant neoplasm of anus, unspecified: Secondary | ICD-10-CM

## 2015-10-15 ENCOUNTER — Ambulatory Visit (HOSPITAL_COMMUNITY)
Admission: RE | Admit: 2015-10-15 | Discharge: 2015-10-15 | Disposition: A | Discharge status: Home / Self Care | Payer: 59 | Source: Ambulatory Visit | Attending: Radiation Oncology | Admitting: Radiation Oncology

## 2015-10-15 ENCOUNTER — Other Ambulatory Visit: Payer: Self-pay | Admitting: Radiation Oncology

## 2015-10-15 DIAGNOSIS — N200 Calculus of kidney: Secondary | ICD-10-CM | POA: Insufficient documentation

## 2015-10-15 DIAGNOSIS — Z90710 Acquired absence of both cervix and uterus: Secondary | ICD-10-CM | POA: Diagnosis not present

## 2015-10-15 DIAGNOSIS — D7389 Other diseases of spleen: Secondary | ICD-10-CM | POA: Insufficient documentation

## 2015-10-15 DIAGNOSIS — K5730 Diverticulosis of large intestine without perforation or abscess without bleeding: Secondary | ICD-10-CM | POA: Insufficient documentation

## 2015-10-15 DIAGNOSIS — Z8543 Personal history of malignant neoplasm of ovary: Secondary | ICD-10-CM | POA: Insufficient documentation

## 2015-10-15 DIAGNOSIS — C210 Malignant neoplasm of anus, unspecified: Secondary | ICD-10-CM

## 2015-10-15 DIAGNOSIS — Z9221 Personal history of antineoplastic chemotherapy: Secondary | ICD-10-CM | POA: Insufficient documentation

## 2015-10-15 DIAGNOSIS — I700 Atherosclerosis of aorta: Secondary | ICD-10-CM | POA: Insufficient documentation

## 2015-10-15 DIAGNOSIS — K314 Gastric diverticulum: Secondary | ICD-10-CM | POA: Diagnosis not present

## 2015-10-15 LAB — GLUCOSE, CAPILLARY: Glucose-Capillary: 90 mg/dL (ref 65–99)

## 2015-10-26 ENCOUNTER — Ambulatory Visit (HOSPITAL_COMMUNITY): Payer: 59

## 2015-11-04 DIAGNOSIS — K1230 Oral mucositis (ulcerative), unspecified: Secondary | ICD-10-CM

## 2015-11-04 DIAGNOSIS — K1379 Other lesions of oral mucosa: Secondary | ICD-10-CM | POA: Insufficient documentation

## 2015-11-04 DIAGNOSIS — K121 Other forms of stomatitis: Secondary | ICD-10-CM | POA: Insufficient documentation

## 2015-11-10 DIAGNOSIS — T451X5A Adverse effect of antineoplastic and immunosuppressive drugs, initial encounter: Secondary | ICD-10-CM

## 2015-11-10 DIAGNOSIS — Z5111 Encounter for antineoplastic chemotherapy: Secondary | ICD-10-CM | POA: Insufficient documentation

## 2015-11-10 DIAGNOSIS — E860 Dehydration: Secondary | ICD-10-CM | POA: Insufficient documentation

## 2015-11-10 DIAGNOSIS — D701 Agranulocytosis secondary to cancer chemotherapy: Secondary | ICD-10-CM | POA: Insufficient documentation

## 2015-11-10 DIAGNOSIS — Z923 Personal history of irradiation: Secondary | ICD-10-CM | POA: Insufficient documentation

## 2015-11-10 DIAGNOSIS — D696 Thrombocytopenia, unspecified: Secondary | ICD-10-CM | POA: Insufficient documentation

## 2015-11-10 DIAGNOSIS — I951 Orthostatic hypotension: Secondary | ICD-10-CM | POA: Insufficient documentation

## 2015-11-10 DIAGNOSIS — R102 Pelvic and perineal pain: Secondary | ICD-10-CM | POA: Insufficient documentation

## 2015-11-17 ENCOUNTER — Ambulatory Visit (HOSPITAL_COMMUNITY): Payer: 59

## 2015-11-19 ENCOUNTER — Encounter: Payer: Self-pay | Admitting: Adult Health

## 2015-11-19 DIAGNOSIS — C210 Malignant neoplasm of anus, unspecified: Secondary | ICD-10-CM | POA: Insufficient documentation

## 2015-11-24 DIAGNOSIS — Z510 Encounter for antineoplastic radiation therapy: Secondary | ICD-10-CM | POA: Diagnosis not present

## 2015-11-24 DIAGNOSIS — E860 Dehydration: Secondary | ICD-10-CM | POA: Diagnosis not present

## 2015-11-24 DIAGNOSIS — C210 Malignant neoplasm of anus, unspecified: Secondary | ICD-10-CM | POA: Diagnosis not present

## 2015-11-24 DIAGNOSIS — R197 Diarrhea, unspecified: Secondary | ICD-10-CM | POA: Diagnosis not present

## 2015-11-24 DIAGNOSIS — K1230 Oral mucositis (ulcerative), unspecified: Secondary | ICD-10-CM | POA: Diagnosis not present

## 2015-11-25 DIAGNOSIS — R197 Diarrhea, unspecified: Secondary | ICD-10-CM | POA: Diagnosis not present

## 2015-11-25 DIAGNOSIS — K1230 Oral mucositis (ulcerative), unspecified: Secondary | ICD-10-CM | POA: Diagnosis not present

## 2015-11-25 DIAGNOSIS — Z510 Encounter for antineoplastic radiation therapy: Secondary | ICD-10-CM | POA: Diagnosis not present

## 2015-11-25 DIAGNOSIS — C210 Malignant neoplasm of anus, unspecified: Secondary | ICD-10-CM | POA: Diagnosis not present

## 2015-11-25 DIAGNOSIS — E860 Dehydration: Secondary | ICD-10-CM | POA: Diagnosis not present

## 2015-11-26 DIAGNOSIS — C210 Malignant neoplasm of anus, unspecified: Secondary | ICD-10-CM | POA: Diagnosis not present

## 2015-11-26 DIAGNOSIS — R197 Diarrhea, unspecified: Secondary | ICD-10-CM | POA: Diagnosis not present

## 2015-11-26 DIAGNOSIS — Z510 Encounter for antineoplastic radiation therapy: Secondary | ICD-10-CM | POA: Diagnosis not present

## 2015-11-26 DIAGNOSIS — E860 Dehydration: Secondary | ICD-10-CM | POA: Diagnosis not present

## 2015-11-26 DIAGNOSIS — K1230 Oral mucositis (ulcerative), unspecified: Secondary | ICD-10-CM | POA: Diagnosis not present

## 2015-11-29 DIAGNOSIS — R197 Diarrhea, unspecified: Secondary | ICD-10-CM | POA: Diagnosis not present

## 2015-11-29 DIAGNOSIS — E860 Dehydration: Secondary | ICD-10-CM | POA: Diagnosis not present

## 2015-11-29 DIAGNOSIS — Z510 Encounter for antineoplastic radiation therapy: Secondary | ICD-10-CM | POA: Diagnosis not present

## 2015-11-29 DIAGNOSIS — C210 Malignant neoplasm of anus, unspecified: Secondary | ICD-10-CM | POA: Diagnosis not present

## 2015-11-29 DIAGNOSIS — K1230 Oral mucositis (ulcerative), unspecified: Secondary | ICD-10-CM | POA: Diagnosis not present

## 2015-11-30 DIAGNOSIS — Z510 Encounter for antineoplastic radiation therapy: Secondary | ICD-10-CM | POA: Diagnosis not present

## 2015-11-30 DIAGNOSIS — R197 Diarrhea, unspecified: Secondary | ICD-10-CM | POA: Diagnosis not present

## 2015-11-30 DIAGNOSIS — C210 Malignant neoplasm of anus, unspecified: Secondary | ICD-10-CM | POA: Diagnosis not present

## 2015-11-30 DIAGNOSIS — E860 Dehydration: Secondary | ICD-10-CM | POA: Diagnosis not present

## 2015-11-30 DIAGNOSIS — K1230 Oral mucositis (ulcerative), unspecified: Secondary | ICD-10-CM | POA: Diagnosis not present

## 2015-12-01 DIAGNOSIS — R197 Diarrhea, unspecified: Secondary | ICD-10-CM | POA: Diagnosis not present

## 2015-12-01 DIAGNOSIS — C210 Malignant neoplasm of anus, unspecified: Secondary | ICD-10-CM | POA: Diagnosis not present

## 2015-12-01 DIAGNOSIS — K1230 Oral mucositis (ulcerative), unspecified: Secondary | ICD-10-CM | POA: Diagnosis not present

## 2015-12-01 DIAGNOSIS — E860 Dehydration: Secondary | ICD-10-CM | POA: Diagnosis not present

## 2015-12-01 DIAGNOSIS — Z510 Encounter for antineoplastic radiation therapy: Secondary | ICD-10-CM | POA: Diagnosis not present

## 2015-12-02 DIAGNOSIS — Z510 Encounter for antineoplastic radiation therapy: Secondary | ICD-10-CM | POA: Diagnosis not present

## 2015-12-02 DIAGNOSIS — R197 Diarrhea, unspecified: Secondary | ICD-10-CM | POA: Diagnosis not present

## 2015-12-02 DIAGNOSIS — E860 Dehydration: Secondary | ICD-10-CM | POA: Diagnosis not present

## 2015-12-02 DIAGNOSIS — K1230 Oral mucositis (ulcerative), unspecified: Secondary | ICD-10-CM | POA: Diagnosis not present

## 2015-12-02 DIAGNOSIS — C210 Malignant neoplasm of anus, unspecified: Secondary | ICD-10-CM | POA: Diagnosis not present

## 2015-12-03 DIAGNOSIS — Z510 Encounter for antineoplastic radiation therapy: Secondary | ICD-10-CM | POA: Diagnosis not present

## 2015-12-03 DIAGNOSIS — R197 Diarrhea, unspecified: Secondary | ICD-10-CM | POA: Diagnosis not present

## 2015-12-03 DIAGNOSIS — C210 Malignant neoplasm of anus, unspecified: Secondary | ICD-10-CM | POA: Diagnosis not present

## 2015-12-03 DIAGNOSIS — E860 Dehydration: Secondary | ICD-10-CM | POA: Diagnosis not present

## 2015-12-03 DIAGNOSIS — K1230 Oral mucositis (ulcerative), unspecified: Secondary | ICD-10-CM | POA: Diagnosis not present

## 2015-12-06 DIAGNOSIS — Z510 Encounter for antineoplastic radiation therapy: Secondary | ICD-10-CM | POA: Diagnosis not present

## 2015-12-06 DIAGNOSIS — E860 Dehydration: Secondary | ICD-10-CM | POA: Diagnosis not present

## 2015-12-06 DIAGNOSIS — R197 Diarrhea, unspecified: Secondary | ICD-10-CM | POA: Diagnosis not present

## 2015-12-06 DIAGNOSIS — R112 Nausea with vomiting, unspecified: Secondary | ICD-10-CM | POA: Diagnosis not present

## 2015-12-06 DIAGNOSIS — C762 Malignant neoplasm of abdomen: Secondary | ICD-10-CM | POA: Diagnosis not present

## 2015-12-06 DIAGNOSIS — K1230 Oral mucositis (ulcerative), unspecified: Secondary | ICD-10-CM | POA: Diagnosis not present

## 2015-12-06 DIAGNOSIS — D701 Agranulocytosis secondary to cancer chemotherapy: Secondary | ICD-10-CM | POA: Diagnosis not present

## 2015-12-06 DIAGNOSIS — Z5111 Encounter for antineoplastic chemotherapy: Secondary | ICD-10-CM | POA: Diagnosis not present

## 2015-12-06 DIAGNOSIS — C210 Malignant neoplasm of anus, unspecified: Secondary | ICD-10-CM | POA: Diagnosis not present

## 2015-12-07 DIAGNOSIS — C210 Malignant neoplasm of anus, unspecified: Secondary | ICD-10-CM | POA: Diagnosis not present

## 2015-12-07 DIAGNOSIS — R197 Diarrhea, unspecified: Secondary | ICD-10-CM | POA: Diagnosis not present

## 2015-12-07 DIAGNOSIS — E860 Dehydration: Secondary | ICD-10-CM | POA: Diagnosis not present

## 2015-12-07 DIAGNOSIS — Z510 Encounter for antineoplastic radiation therapy: Secondary | ICD-10-CM | POA: Diagnosis not present

## 2015-12-07 DIAGNOSIS — K1230 Oral mucositis (ulcerative), unspecified: Secondary | ICD-10-CM | POA: Diagnosis not present

## 2015-12-08 DIAGNOSIS — K1230 Oral mucositis (ulcerative), unspecified: Secondary | ICD-10-CM | POA: Diagnosis not present

## 2015-12-08 DIAGNOSIS — R197 Diarrhea, unspecified: Secondary | ICD-10-CM | POA: Diagnosis not present

## 2015-12-08 DIAGNOSIS — E860 Dehydration: Secondary | ICD-10-CM | POA: Diagnosis not present

## 2015-12-08 DIAGNOSIS — Z510 Encounter for antineoplastic radiation therapy: Secondary | ICD-10-CM | POA: Diagnosis not present

## 2015-12-08 DIAGNOSIS — C210 Malignant neoplasm of anus, unspecified: Secondary | ICD-10-CM | POA: Diagnosis not present

## 2015-12-09 DIAGNOSIS — R197 Diarrhea, unspecified: Secondary | ICD-10-CM | POA: Diagnosis not present

## 2015-12-09 DIAGNOSIS — C210 Malignant neoplasm of anus, unspecified: Secondary | ICD-10-CM | POA: Diagnosis not present

## 2015-12-09 DIAGNOSIS — K1230 Oral mucositis (ulcerative), unspecified: Secondary | ICD-10-CM | POA: Diagnosis not present

## 2015-12-09 DIAGNOSIS — E860 Dehydration: Secondary | ICD-10-CM | POA: Diagnosis not present

## 2015-12-09 DIAGNOSIS — Z510 Encounter for antineoplastic radiation therapy: Secondary | ICD-10-CM | POA: Diagnosis not present

## 2015-12-10 DIAGNOSIS — R197 Diarrhea, unspecified: Secondary | ICD-10-CM | POA: Diagnosis not present

## 2015-12-10 DIAGNOSIS — C7989 Secondary malignant neoplasm of other specified sites: Secondary | ICD-10-CM | POA: Diagnosis not present

## 2015-12-10 DIAGNOSIS — E860 Dehydration: Secondary | ICD-10-CM | POA: Diagnosis not present

## 2015-12-10 DIAGNOSIS — C210 Malignant neoplasm of anus, unspecified: Secondary | ICD-10-CM | POA: Diagnosis not present

## 2015-12-10 DIAGNOSIS — Z510 Encounter for antineoplastic radiation therapy: Secondary | ICD-10-CM | POA: Diagnosis not present

## 2015-12-10 DIAGNOSIS — Z95828 Presence of other vascular implants and grafts: Secondary | ICD-10-CM | POA: Diagnosis not present

## 2015-12-10 DIAGNOSIS — K1230 Oral mucositis (ulcerative), unspecified: Secondary | ICD-10-CM | POA: Diagnosis not present

## 2015-12-13 DIAGNOSIS — Z08 Encounter for follow-up examination after completed treatment for malignant neoplasm: Secondary | ICD-10-CM | POA: Diagnosis not present

## 2015-12-13 DIAGNOSIS — K121 Other forms of stomatitis: Secondary | ICD-10-CM | POA: Diagnosis not present

## 2015-12-13 DIAGNOSIS — Z85048 Personal history of other malignant neoplasm of rectum, rectosigmoid junction, and anus: Secondary | ICD-10-CM | POA: Diagnosis not present

## 2015-12-13 DIAGNOSIS — E860 Dehydration: Secondary | ICD-10-CM | POA: Diagnosis not present

## 2015-12-13 DIAGNOSIS — K1230 Oral mucositis (ulcerative), unspecified: Secondary | ICD-10-CM | POA: Diagnosis not present

## 2015-12-14 DIAGNOSIS — E860 Dehydration: Secondary | ICD-10-CM | POA: Diagnosis not present

## 2015-12-14 DIAGNOSIS — K1230 Oral mucositis (ulcerative), unspecified: Secondary | ICD-10-CM | POA: Diagnosis not present

## 2015-12-14 DIAGNOSIS — R197 Diarrhea, unspecified: Secondary | ICD-10-CM | POA: Diagnosis not present

## 2015-12-14 DIAGNOSIS — C210 Malignant neoplasm of anus, unspecified: Secondary | ICD-10-CM | POA: Diagnosis not present

## 2015-12-14 DIAGNOSIS — Z510 Encounter for antineoplastic radiation therapy: Secondary | ICD-10-CM | POA: Diagnosis not present

## 2015-12-15 DIAGNOSIS — I951 Orthostatic hypotension: Secondary | ICD-10-CM | POA: Diagnosis not present

## 2015-12-15 DIAGNOSIS — C210 Malignant neoplasm of anus, unspecified: Secondary | ICD-10-CM | POA: Diagnosis not present

## 2015-12-15 DIAGNOSIS — D638 Anemia in other chronic diseases classified elsewhere: Secondary | ICD-10-CM | POA: Insufficient documentation

## 2015-12-15 DIAGNOSIS — R634 Abnormal weight loss: Secondary | ICD-10-CM | POA: Insufficient documentation

## 2015-12-15 DIAGNOSIS — K219 Gastro-esophageal reflux disease without esophagitis: Secondary | ICD-10-CM | POA: Diagnosis not present

## 2015-12-15 DIAGNOSIS — Z95828 Presence of other vascular implants and grafts: Secondary | ICD-10-CM | POA: Diagnosis not present

## 2015-12-16 DIAGNOSIS — K589 Irritable bowel syndrome without diarrhea: Secondary | ICD-10-CM | POA: Diagnosis not present

## 2015-12-16 DIAGNOSIS — D649 Anemia, unspecified: Secondary | ICD-10-CM | POA: Diagnosis not present

## 2015-12-16 DIAGNOSIS — R05 Cough: Secondary | ICD-10-CM | POA: Diagnosis not present

## 2015-12-16 DIAGNOSIS — I10 Essential (primary) hypertension: Secondary | ICD-10-CM | POA: Diagnosis not present

## 2015-12-16 DIAGNOSIS — F332 Major depressive disorder, recurrent severe without psychotic features: Secondary | ICD-10-CM | POA: Diagnosis not present

## 2015-12-16 DIAGNOSIS — M19042 Primary osteoarthritis, left hand: Secondary | ICD-10-CM | POA: Diagnosis not present

## 2015-12-16 DIAGNOSIS — K219 Gastro-esophageal reflux disease without esophagitis: Secondary | ICD-10-CM | POA: Diagnosis not present

## 2015-12-16 DIAGNOSIS — Z87891 Personal history of nicotine dependence: Secondary | ICD-10-CM | POA: Diagnosis not present

## 2015-12-16 DIAGNOSIS — C211 Malignant neoplasm of anal canal: Secondary | ICD-10-CM | POA: Diagnosis not present

## 2015-12-16 DIAGNOSIS — R509 Fever, unspecified: Secondary | ICD-10-CM | POA: Diagnosis not present

## 2015-12-16 DIAGNOSIS — Z79891 Long term (current) use of opiate analgesic: Secondary | ICD-10-CM | POA: Diagnosis not present

## 2015-12-16 DIAGNOSIS — R197 Diarrhea, unspecified: Secondary | ICD-10-CM | POA: Diagnosis not present

## 2015-12-16 DIAGNOSIS — D709 Neutropenia, unspecified: Secondary | ICD-10-CM | POA: Diagnosis not present

## 2015-12-16 DIAGNOSIS — E876 Hypokalemia: Secondary | ICD-10-CM | POA: Diagnosis not present

## 2015-12-16 DIAGNOSIS — A047 Enterocolitis due to Clostridium difficile: Secondary | ICD-10-CM | POA: Diagnosis not present

## 2015-12-16 DIAGNOSIS — D703 Neutropenia due to infection: Secondary | ICD-10-CM | POA: Diagnosis not present

## 2015-12-16 DIAGNOSIS — M19041 Primary osteoarthritis, right hand: Secondary | ICD-10-CM | POA: Diagnosis not present

## 2015-12-16 DIAGNOSIS — C210 Malignant neoplasm of anus, unspecified: Secondary | ICD-10-CM | POA: Diagnosis not present

## 2015-12-16 DIAGNOSIS — Z8543 Personal history of malignant neoplasm of ovary: Secondary | ICD-10-CM | POA: Diagnosis not present

## 2015-12-16 DIAGNOSIS — D6481 Anemia due to antineoplastic chemotherapy: Secondary | ICD-10-CM | POA: Diagnosis not present

## 2015-12-16 DIAGNOSIS — R5081 Fever presenting with conditions classified elsewhere: Secondary | ICD-10-CM | POA: Diagnosis not present

## 2015-12-16 DIAGNOSIS — Z79899 Other long term (current) drug therapy: Secondary | ICD-10-CM | POA: Diagnosis not present

## 2015-12-16 DIAGNOSIS — E860 Dehydration: Secondary | ICD-10-CM | POA: Diagnosis not present

## 2015-12-23 DIAGNOSIS — E876 Hypokalemia: Secondary | ICD-10-CM | POA: Diagnosis not present

## 2015-12-23 DIAGNOSIS — C210 Malignant neoplasm of anus, unspecified: Secondary | ICD-10-CM | POA: Diagnosis not present

## 2015-12-23 DIAGNOSIS — K627 Radiation proctitis: Secondary | ICD-10-CM | POA: Insufficient documentation

## 2015-12-23 DIAGNOSIS — A498 Other bacterial infections of unspecified site: Secondary | ICD-10-CM | POA: Insufficient documentation

## 2015-12-23 DIAGNOSIS — C762 Malignant neoplasm of abdomen: Secondary | ICD-10-CM | POA: Diagnosis not present

## 2016-01-13 DIAGNOSIS — D701 Agranulocytosis secondary to cancer chemotherapy: Secondary | ICD-10-CM | POA: Diagnosis not present

## 2016-01-13 DIAGNOSIS — Z923 Personal history of irradiation: Secondary | ICD-10-CM | POA: Diagnosis not present

## 2016-01-13 DIAGNOSIS — C210 Malignant neoplasm of anus, unspecified: Secondary | ICD-10-CM | POA: Diagnosis not present

## 2016-01-17 DIAGNOSIS — C210 Malignant neoplasm of anus, unspecified: Secondary | ICD-10-CM | POA: Diagnosis not present

## 2016-01-31 DIAGNOSIS — C210 Malignant neoplasm of anus, unspecified: Secondary | ICD-10-CM | POA: Diagnosis not present

## 2016-01-31 DIAGNOSIS — Z95828 Presence of other vascular implants and grafts: Secondary | ICD-10-CM | POA: Diagnosis not present

## 2016-02-28 DIAGNOSIS — Z1389 Encounter for screening for other disorder: Secondary | ICD-10-CM | POA: Diagnosis not present

## 2016-02-28 DIAGNOSIS — I10 Essential (primary) hypertension: Secondary | ICD-10-CM | POA: Diagnosis not present

## 2016-02-28 DIAGNOSIS — Z6821 Body mass index (BMI) 21.0-21.9, adult: Secondary | ICD-10-CM | POA: Diagnosis not present

## 2016-03-17 DIAGNOSIS — C210 Malignant neoplasm of anus, unspecified: Secondary | ICD-10-CM | POA: Diagnosis not present

## 2016-04-03 ENCOUNTER — Encounter (HOSPITAL_COMMUNITY): Payer: 59 | Attending: Hematology & Oncology | Admitting: Hematology & Oncology

## 2016-04-03 ENCOUNTER — Encounter (HOSPITAL_COMMUNITY): Payer: Self-pay | Admitting: Hematology & Oncology

## 2016-04-03 VITALS — BP 149/70 | HR 82 | Temp 97.8°F | Resp 16 | Ht 66.0 in | Wt 128.0 lb

## 2016-04-03 DIAGNOSIS — C569 Malignant neoplasm of unspecified ovary: Secondary | ICD-10-CM | POA: Diagnosis not present

## 2016-04-03 DIAGNOSIS — Z720 Tobacco use: Secondary | ICD-10-CM

## 2016-04-03 DIAGNOSIS — N823 Fistula of vagina to large intestine: Secondary | ICD-10-CM | POA: Diagnosis not present

## 2016-04-03 DIAGNOSIS — C210 Malignant neoplasm of anus, unspecified: Secondary | ICD-10-CM

## 2016-04-03 MED ORDER — DICYCLOMINE HCL 10 MG PO CAPS: ORAL_CAPSULE | ORAL | Status: DC

## 2016-04-03 NOTE — Progress Notes (Signed)
[place]  CONSULT NOTE  Patient Care Team: Caren Macadam, MD as PCP - General (Family Medicine)  CHIEF COMPLAINTS/PURPOSE OF CONSULTATION:  Squamous cell carcinoma of the anus completed therapy 11/2015, per records 5-FU/mitomycin dose reduced with cycle #2 Recto-vaginal fistula Ovarian carcinoma L, Stage IIIA Abdominal carcinomatosis Exploratory laparotomy, omentectomy, Bilateral salpingo-oophorectomy, intraperitoneal PORT placement 07/03/2014 Dr. Alycia Rossetti, R0 resection Intraperitoneal port removal and port a cath placement by Dr. Dalbert Batman 08/17/2014 Post -op (adjuvant) Carboplatin/Taxol X 6 cycles Colonoscopy with Dr. Laural Golden 10/07/2015 Exam under anesthesia/placement of seton/biopsy of rectovaginal mass with Dr. Marcello Moores 09/09/2015  HISTORY OF PRESENTING ILLNESS:  Abigail Wagner 65 y.o. female is here because of a history of anal carcinoma presenting as a rectovaginal mass having just completed concurrent therapy in March of this year and ovarian carcinoma having completed surgery and 6 cycles of Carboplatin/Taxol last year.   Mrs. Leise is accompanied by her husband, Coralyn Mark.  Initially, she was out shopping then felt as though she had soiled her pants. She was experiencing abdominal pain and went to see her GYN. This was when a fistula was found. Per available records she ultimately underwent an exam under anesthesia with Dr. Marcello Moores where a rectovaginal mass was noted. She was ultimately diagnosed with anal carcinoma.  She has a history of ovarian cancer on the left, first diagnosed in 2015. She had been having pain on the right abdominal side and knew most of her vital organs were on that side so she saw her PCP. She was also feeling very bloated after eating. Her PCP sent her for a CT scan where the cancer was discovered. She was then sent to [facility] for surgery with Dr. Alycia Rossetti.   Admits the treatment was rough. She last saw Dr. Marcello Moores on 03/17/2016. She last saw Dr.  Jacquiline Doe in April 2017.  She continues to experience leakage though not as bad as it was. She feels as though it is improving as it is not leaking as much. This leakage continues to have a stool odor. When she has a bowel movement she uses baby wipes to make sure she is clean then about 20 minutes later she will notice she is soiled. When she urinates there is a stool smell, however this is less than it was. Admits this is a very frustrating problem.   When asked if she feels any numbness or tingling in her fingers or toes, she admits at night her toes often feel cold but are not cold to the touch.   Her previous port flush at [facility] was scheduled on 04/13/2016. She has numbing cream for her port but states she normally does not need it.  Her mother-in-law recently passed at the age of 81. She notes that she and her husband have been dealing with a lot but have overall done well.   She receives mammograms every year. She has not yet received a 3D mammogram.   The patient is here for continuation of care and follow up for anal cancer and her ovarian carcinoma.   MEDICAL HISTORY:  Past Medical History  Diagnosis Date  . IBS (irritable bowel syndrome)     takes Bentyl daily  . HTN (hypertension)     takes Metoprolol daily  . Smokers' cough ([place])   . Headache(784.0)   . History of kidney stones   . Prolapse of vaginal vault after hysterectomy 07/20/2015  . Rectal mass 08/03/2015  . Rectovaginal fistula 08/05/2015  . Major depression (  [place])   . Mood disorder ([place])   . History of suicide attempt     per documentation in epic  05-30-2009  overdose benaodiazepine  . History of acute respiratory failure     05-30-2009  drug overdose-- (intubated for 2 days)  and aspiration pneumonia  . Sigmoid diverticulosis   . Carcinoma of ovary, stage 3 [facility]) oncologist-  dr gehrig/  dr Doreene Eland (cancer center in [place]/ [facility])--  no recurrency    dx 09/ 2015  Stage IIIB  papillary ovarian carcinoma  s/p   omentectomy and BSO &  chemotherapy (completed 12-07-2014)  . GERD (gastroesophageal reflux disease)   . History of herpes genitalis   . DDD (degenerative disc disease), lumbosacral   . OA (osteoarthritis)     SURGICAL HISTORY: Past Surgical History  Procedure Laterality Date  . Cesarean section  1978  . Breast enhancement surgery  1986  . Knee arthroscopy Left 09-22-2004  . Colonoscopy    . Laparoscopy N/A 06/02/2014    Procedure: DIAGNOSTIC LAPAROSCOPY, OMENTAL BIOPSY, RIGHT OVARY BIOPSY, LYSIS OF ADHESIONS;  Surgeon: Fanny Skates, MD;  Location: [place];  Service: General;  Laterality: N/A;  . Portacath placement Right 08/17/2014    Procedure:  PLACE NEW PORT A CATH;  Surgeon: Fanny Skates, MD;  Location: [place];  Service: General;  Laterality: Right;  . Port-a-cath removal Right 08/17/2014    Procedure: REMOVAL INTRAPERITONEAL CHEMO PORT;  Surgeon: Fanny Skates, MD;  Location: [place];  Service: General;  Laterality: Right;  . Vaginal hysterectomy  1981  . Exploratory laparotomy/ omentectomy/  bilateral salpingoophorectomy/  port-a-cath placement  07-03-2014   [facility]  . Tubal ligation  YRS AGO  . Placement of seton N/A 09/09/2015    Procedure: PLACEMENT OF SETON;  Surgeon: Leighton Ruff, MD;  [facility];  Service: General;  Laterality: N/A;  . Rectal biopsy N/A 09/09/2015    Procedure: BIOPSY OF RECTOVAGINAL MASS;  Surgeon: Leighton Ruff, MD;  Location: [facility];  Service: General;  Laterality: N/A;  . Colonoscopy N/A 10/07/2015    Procedure: COLONOSCOPY;  Surgeon: Rogene Houston, MD;  [facility];  Service: Endoscopy;  Laterality: N/A;  7:30    SOCIAL HISTORY: Social History   Social History  . Marital Status: Married    Spouse Name: N/A  . Number of Children: N/A  . Years of Education: N/A   Occupational History  . Not on file.   Social History Main Topics  . Smoking  status: Current Every Day Smoker -- 1.00 packs/day for 43 years    Types: Cigarettes  . Smokeless tobacco: Never Used  . Alcohol Use: No  . Drug Use: No  . Sexual Activity: Not on file   Other Topics Concern  . Not on file   Social History Narrative   Married 26 years 5 children between them 7 grandchildren 1 great grandchild Smoker, 1/2 ppd.  ETOH, quit 2 years ago. Born in [place], moved here in 1987 She worked at [employer].  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 She enjoys watching television  FAMILY HISTORY: Family History  Problem Relation Age of Onset  . Bipolar disorder Mother   . Anxiety disorder Mother   . Dementia Mother   . Alcohol abuse Paternal Uncle   . Bipolar disorder Maternal Grandmother   . Dementia Maternal Grandmother   . ADD / ADHD Neg Hx   .  Drug abuse Neg Hx   . Depression Neg Hx   . OCD Neg Hx   . Paranoid behavior Neg Hx   . Schizophrenia Neg Hx   . Seizures Neg Hx   . Sexual abuse Neg Hx   . Physical abuse Neg Hx   . Alcohol abuse Paternal Uncle   . Stroke Brother   . Deep vein thrombosis Son    Mother deceased at 74 yo with Alzheimer's Father deceased of a massive stroke at 2 yo 3 brothers and 1 sister. The patient is the youngest Sister is the oldest  Oldest brother had a quadruple bypass years ago Next brother died of a stroke a few years ago The next brother has some heart issues but is doing fine.  ALLERGIES:  has No Known Allergies.  MEDICATIONS:  Current Outpatient Prescriptions  Medication Sig Dispense Refill  . Aspirin-Salicylamide-Caffeine (BC HEADACHE POWDER PO) Take 1 packet by mouth as needed.     Marland Kitchen b complex vitamins tablet Take 1 tablet by mouth daily.    Marland Kitchen dicyclomine (BENTYL) 10 MG capsule Take 10 mg by mouth as needed.     . divalproex (DEPAKOTE) 250 MG DR tablet Take 1 tablet (250 mg total) by mouth every evening. 90 tablet 1  . escitalopram (LEXAPRO) 20 MG tablet Take 1 tablet (20  mg total) by mouth every morning. 90 tablet 1  . hydrocortisone (PROCTOSOL HC) 2.5 % rectal cream Place rectally as needed. 28.35 g 0  . KRILL OIL PO Take 350 mg by mouth daily.    . lansoprazole (PREVACID) 15 MG capsule Take 15 mg by mouth as needed.     Marland Kitchen LORazepam (ATIVAN) 1 MG tablet Take 1 tablet (1 mg total) by mouth 2 (two) times daily. 60 tablet 3  . metoprolol tartrate (LOPRESSOR) 25 MG tablet Take 25 mg by mouth 2 (two) times daily.     . metroNIDAZOLE (METROGEL) 0.75 % vaginal gel PLACE 1 APPLICATORFUL VAGINALLY AT BEDTIME PRN 70 g 2  . Multiple Vitamin (MULTIVITAMIN) tablet Take 1 tablet by mouth daily.    . valACYclovir (VALTREX) 1000 MG tablet Take 1,000 mg by mouth daily.  0  . HYDROcodone-acetaminophen (NORCO/VICODIN) 5-325 MG tablet Take 1-2 tablets by mouth every 6 (six) hours as needed. (Patient not taking: Reported on 04/03/2016) 30 tablet 0   No current facility-administered medications for this visit.    Review of Systems  Constitutional: Positive for malaise/fatigue.  HENT: Negative.   Eyes: Negative.   Respiratory: Negative.   Cardiovascular: Negative.   Gastrointestinal: Negative.   Genitourinary: Negative.        Fistula leakage (improving)  Musculoskeletal: Negative.   Skin: Negative.   Neurological: Negative.   Endo/Heme/Allergies: Negative.   Psychiatric/Behavioral: Negative.   All other systems reviewed and are negative.  14 point ROS was done and is otherwise as detailed above or in HPI   PHYSICAL EXAMINATION: ECOG PERFORMANCE STATUS: 1 - Symptomatic but completely ambulatory  Filed Vitals:   04/03/16 1415  BP: 149/70  Pulse: 82  Temp: 97.8 F (36.6 C)  Resp: 16   Filed Weights   04/03/16 1415  Weight: 128 lb (58.06 kg)     Physical Exam  Constitutional: She is oriented to person, place, and time and well-developed, well-nourished, and in no distress.  HENT:  Head: Normocephalic and atraumatic.  Nose: Nose normal.  Mouth/Throat:  Oropharynx is clear and moist. No oropharyngeal exudate.  Eyes: Conjunctivae and EOM are normal. Pupils are  equal, round, and reactive to light. Right eye exhibits no discharge. Left eye exhibits no discharge. No scleral icterus.  Neck: Normal range of motion. Neck supple. No tracheal deviation present. No thyromegaly present.  Cardiovascular: Normal rate, regular rhythm and normal heart sounds.  Exam reveals no gallop and no friction rub.   No murmur heard. Pulmonary/Chest: Effort normal and breath sounds normal. She has no wheezes. She has no rales.  Abdominal: Soft. Bowel sounds are normal. She exhibits no distension and no mass. There is no tenderness. There is no rebound and no guarding.  Musculoskeletal: Normal range of motion. She exhibits no edema.  Lymphadenopathy:    She has no cervical adenopathy.  Neurological: She is alert and oriented to person, place, and time. She has normal reflexes. No cranial nerve deficit. Gait normal. Coordination normal.  Skin: Skin is warm and dry. No rash noted.  Psychiatric: Mood, memory, affect and judgment normal.  Nursing note and vitals reviewed.   LABORATORY DATA:  I have reviewed the data as listed Lab Results  Component Value Date   WBC 5.9 08/14/2014   HGB 13.3 09/09/2015   HCT 39.0 09/09/2015   MCV 94.1 08/14/2014   PLT 307 08/14/2014   CMP     Component Value Date/Time   NA 139 09/09/2015 1316   K 3.8 09/09/2015 1316   CL 104 08/14/2014 1000   CO2 28 08/14/2014 1000   GLUCOSE 79 09/09/2015 1316   BUN 11 08/14/2014 1000   CREATININE 0.60 09/22/2015 1527   CREATININE 0.54 04/15/2014 1027   CALCIUM 10.1 08/14/2014 1000   PROT 7.5 08/14/2014 1000   ALBUMIN 4.1 08/14/2014 1000   AST 40* 08/14/2014 1000   ALT 43* 08/14/2014 1000   ALKPHOS 120* 08/14/2014 1000   BILITOT 0.4 08/14/2014 1000   GFRNONAA >90 08/14/2014 1000   GFRAA >90 08/14/2014 1000       PATHOLOGY:            RADIOGRAPHIC STUDIES: I have  personally reviewed the radiological images as listed and agreed with the findings in the report.  Study Result     CLINICAL DATA: Initial treatment strategy for squamous cell carcinoma of the anus diagnosed on 09/09/2015 biopsy. Additional history of serous ovarian carcinoma diagnosed on 06/02/2014 treated with chemotherapy.  EXAM: NUCLEAR MEDICINE PET SKULL BASE TO THIGH  TECHNIQUE: 6.9 mCi F-18 FDG was injected intravenously. Full-ring PET imaging was performed from the skull base to thigh after the radiotracer. CT data was obtained and used for attenuation correction and anatomic localization.  FASTING BLOOD GLUCOSE: Value: 90 mg/dl  COMPARISON: 09/22/2015 CT chest, abdomen and pelvis.  FINDINGS: NECK  No hypermetabolic lymph nodes in the neck. Symmetric hypermetabolism in the glottis is probably physiologic.  CHEST  No hypermetabolic axillary, mediastinal or hilar nodes. Right internal jugular MediPort terminates at the cavoatrial junction. Bilateral breast prostheses, which demonstrate intracapsular rupture. No significant pulmonary nodules or acute consolidative airspace disease. Mild biapical pleural-parenchymal scarring is stable since 01/08/2015.  ABDOMEN/PELVIS  There is focal hypermetabolism with max SUV 8.9 with associated ill-defined soft tissue fullness at the junction of the anterior anal wall and posterior inferior vaginal wall (series 4/image 191).  No abnormal hypermetabolic activity within the liver, pancreas, adrenal glands, or spleen. No hypermetabolic lymph nodes in the abdomen or pelvis. Status post hysterectomy, with no abnormal findings at the vaginal cuff. No adnexal mass. No hypermetabolic peritoneal tumor nodules.  Subcapsular calcification in the posterior spleen is stable and likely  from remote insult. Stable small diverticulum at the posterior gastric fundus. Nonobstructing 3 mm stone in the lower right kidney.  Nonobstructing 2 mm stone in the lower left kidney. Mild sigmoid diverticulosis. Atherosclerotic nonaneurysmal abdominal aorta.  SKELETON  No focal hypermetabolic activity to suggest skeletal metastasis.  IMPRESSION: 1. Focal hypermetabolism with associated ill-defined soft tissue fullness at the junction of the anterior anal wall and posterior inferior vaginal wall, in keeping with the provided history of a primary anal carcinoma. 2. No hypermetabolic locoregional or distant metastatic disease. 3. Status post hysterectomy, with no abnormal findings at the vaginal cuff. No evidence of hypermetabolic peritoneal tumor. 4. Tiny nonobstructing bilateral renal stones.   Electronically Signed  By: Ilona Sorrel M.D.  On: 10/15/2015 13:35    Study Result     CLINICAL DATA: Newly diagnosed anal carcinoma. Two weeks postop from rectovaginal fistula repair. Personal history of ovarian carcinoma treated with surgery and chemotherapy.  EXAM: CT CHEST, ABDOMEN, AND PELVIS WITH CONTRAST  TECHNIQUE: Multidetector CT imaging of the chest, abdomen and pelvis was performed following the standard protocol during bolus administration of intravenous contrast.  CONTRAST: 160mL OMNIPAQUE IOHEXOL 300 MG/ML SOLN  COMPARISON: [facility] CT on 05/19/2015  FINDINGS: CT CHEST FINDINGS  Mediastinum/Lymph Nodes: No masses, pathologically enlarged lymph nodes, or other significant abnormality.  Lungs/Pleura: No pulmonary mass, infiltrate, or effusion. Mild left lower lobe scarring noted.  Musculoskeletal: No chest wall mass or suspicious bone lesions identified. Old benign-appearing sternal fracture appears stable.  CT ABDOMEN PELVIS FINDINGS  Hepatobiliary: No masses or other significant abnormality.  Pancreas: No mass, inflammatory changes, or other significant abnormality.  Spleen: Within normal limits in size and appearance.  Adrenals/Urinary Tract: No masses  identified. No evidence of hydronephrosis.  Stomach/Bowel: Small posterior gastric fundal diverticulum again demonstrated as well as small left posterior diaphragmatic hernia containing only fat. No evidence of obstruction, inflammatory process, or abnormal fluid collections. No definite mass or lymphadenopathy seen in the perianal or perirectal regions by CT.  Vascular/Lymphatic: No pathologically enlarged lymph nodes. No evidence of abdominal aortic aneursym.  Reproductive: Prior hysterectomy noted. Adnexal regions are unremarkable in appearance. Mild pelvic floor relaxation with cystocele again noted.  Other: None.  Musculoskeletal: No suspicious bone lesions identified.  IMPRESSION: No evidence of metastatic disease within the chest, abdomen, or pelvis.  Prior hysterectomy. Mild pelvic floor relaxation with cystocele again noted.   Electronically Signed  By: Earle Gell M.D.  On: 09/22/2015 16:35   No results found.  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 Overall she is doing well. She seems to be making a good recovery.   She last saw Dr. Jacquiline Doe in April 2017. She last saw Dr. Marcello Moores on 03/17/2016.   We will move forward with observation. Will have her sign a release to get copies of her prior medical records.   She receives mammograms annually.  I have refilled her dicyclomine.   I will schedule her for a port flush and CBC on 04/13/2016.  She will return for follow up in 2 months. At this next visit we will discuss the NCCN guidelines for follow up scans and visits for both her ovarian cancer and anal cancer.   Orders Placed This Encounter  Procedures  . CA 125    Standing Status:   Future  Number of Occurrences:   1    Standing Expiration Date:    04/13/2017  . CBC with Differential    Standing Status:   Future    Number of Occurrences:   1    Standing Expiration Date:   04/13/2017  . Comprehensive metabolic panel    Standing Status:   Future    Number of Occurrences:   1    Standing Expiration Date:   04/13/2017  . CEA    Standing Status:   Future    Number of Occurrences:   1    Standing Expiration Date:   04/13/2017    All questions were answered. The patient knows to call the clinic with any problems, questions or concerns.  This document serves as a record of services personally performed by Ancil Linsey, MD. It was created on her behalf by Arlyce Harman, a trained medical scribe. The creation of this record is based on the scribe's personal observations and the provider's statements to them. This document has been checked and approved by the attending provider.  I have reviewed the above documentation for accuracy and completeness, and I agree with the above.  This note was electronically signed.    Molli Hazard, MD  04/03/2016 2:58 PM

## 2016-04-03 NOTE — Patient Instructions (Signed)
Abigail Wagner at [facility] Discharge Instructions  RECOMMENDATIONS MADE BY THE CONSULTANT AND ANY TEST RESULTS WILL BE SENT TO YOUR REFERRING PHYSICIAN.  You were seen by Dr. Whitney Muse today. Prescription was faxed to [facility]. Come for Port flush and labs on 7/20 Return to clinic in 8 weeks. Please call clinic with any concerns.  Thank you for choosing [place] at [facility] to provide your oncology and hematology care.  To afford each patient quality time with our provider, please arrive at least 15 minutes before your scheduled appointment time.   Beginning January 23rd 2017 lab work for the [facility] will be done in the  Main lab at [facility] on 1st floor. If you have a lab appointment with the [facility] please come in thru the  Main Entrance and check in at the main information desk  You need to re-schedule your appointment should you arrive 10 or more minutes late.  We strive to give you quality time with our providers, and arriving late affects you and other patients whose appointments are after yours.  Also, if you no show three or more times for appointments you may be dismissed from the clinic at the providers discretion.     Again, thank you for choosing [facility].  Our hope is that these requests will decrease the amount of time that you wait before being seen by our physicians.       _____________________________________________________________  Should you have questions after your visit to [facility], please contact our office at [phone] between the hours of 8:30 a.m. and 4:30 p.m.  Voicemails left after 4:30 p.m. will not be returned until the following business day.  For prescription refill requests, have your pharmacy contact our office.         Resources For Cancer Patients and their Caregivers ? American Cancer Society: Can assist with transportation, wigs, general needs, runs Look  Good Feel Better.        346-761-7771 ? Cancer Care: Provides financial assistance, online support groups, medication/co-pay assistance.  1-800-813-HOPE 714-602-8189) ? [facility] Assists [place] cancer patients and their families through emotional , educational and financial support.  9850905888 ? [place] DSS Where to apply for food stamps, Medicaid and utility assistance. (204)201-2153 ? [facility]: Transportation to medical appointments. (609) 680-3653 ? Social Security Administration: May apply for disability if have a Stage IV cancer. 780-626-6484 248-057-3996 ? [employer], Disability and Transit Services: Assists with nutrition, care and transit needs. [place] Support Programs: @10RELATIVEDAYS @ > Cancer Support Group  2nd Tuesday of the month 1pm-2pm, Journey Room  > Creative Journey  3rd Tuesday of the month 1130am-1pm, Journey Room  > Look Good Feel Better  1st Wednesday of the month 10am-12 noon, Journey Room (Call [place] to register [phone])

## 2016-04-13 ENCOUNTER — Encounter (HOSPITAL_BASED_OUTPATIENT_CLINIC_OR_DEPARTMENT_OTHER): Payer: 59

## 2016-04-13 VITALS — BP 121/53 | HR 86 | Temp 98.0°F | Resp 20

## 2016-04-13 DIAGNOSIS — C210 Malignant neoplasm of anus, unspecified: Secondary | ICD-10-CM | POA: Diagnosis not present

## 2016-04-13 DIAGNOSIS — C569 Malignant neoplasm of unspecified ovary: Secondary | ICD-10-CM | POA: Diagnosis present

## 2016-04-13 DIAGNOSIS — Z452 Encounter for adjustment and management of vascular access device: Secondary | ICD-10-CM | POA: Diagnosis not present

## 2016-04-13 DIAGNOSIS — Z95828 Presence of other vascular implants and grafts: Secondary | ICD-10-CM

## 2016-04-13 LAB — COMPREHENSIVE METABOLIC PANEL
ALT: 21 U/L (ref 14–54)
AST: 23 U/L (ref 15–41)
Albumin: 4 g/dL (ref 3.5–5.0)
Alkaline Phosphatase: 66 U/L (ref 38–126)
Anion gap: 7 (ref 5–15)
BUN: 19 mg/dL (ref 6–20)
CHLORIDE: 103 mmol/L (ref 101–111)
CO2: 27 mmol/L (ref 22–32)
Calcium: 9 mg/dL (ref 8.9–10.3)
Creatinine, Ser: 0.45 mg/dL (ref 0.44–1.00)
Glucose, Bld: 86 mg/dL (ref 65–99)
POTASSIUM: 3.8 mmol/L (ref 3.5–5.1)
Sodium: 137 mmol/L (ref 135–145)
Total Bilirubin: 0.4 mg/dL (ref 0.3–1.2)
Total Protein: 6.7 g/dL (ref 6.5–8.1)

## 2016-04-13 LAB — CBC WITH DIFFERENTIAL/PLATELET
BASOS ABS: 0 10*3/uL (ref 0.0–0.1)
Basophils Relative: 1 %
EOS PCT: 11 %
Eosinophils Absolute: 0.4 10*3/uL (ref 0.0–0.7)
HCT: 34.6 % — ABNORMAL LOW (ref 36.0–46.0)
Hemoglobin: 11.7 g/dL — ABNORMAL LOW (ref 12.0–15.0)
LYMPHS ABS: 0.9 10*3/uL (ref 0.7–4.0)
LYMPHS PCT: 22 %
MCH: 32.9 pg (ref 26.0–34.0)
MCHC: 33.8 g/dL (ref 30.0–36.0)
MCV: 97.2 fL (ref 78.0–100.0)
MONO ABS: 0.2 10*3/uL (ref 0.1–1.0)
Monocytes Relative: 6 %
Neutro Abs: 2.5 10*3/uL (ref 1.7–7.7)
Neutrophils Relative %: 61 %
PLATELETS: 218 10*3/uL (ref 150–400)
RBC: 3.56 MIL/uL — ABNORMAL LOW (ref 3.87–5.11)
RDW: 12.9 % (ref 11.5–15.5)
WBC: 4 10*3/uL (ref 4.0–10.5)

## 2016-04-13 MED ORDER — HEPARIN SOD (PORK) LOCK FLUSH 100 UNIT/ML IV SOLN
500.0000 [IU] | Freq: Once | INTRAVENOUS | Status: AC
  Administered 2016-04-13: 500 [IU] via INTRAVENOUS
  Filled 2016-04-13: qty 5

## 2016-04-13 MED ORDER — SODIUM CHLORIDE 0.9% FLUSH
10.0000 mL | Freq: Once | INTRAVENOUS | Status: AC
  Administered 2016-04-13: 10 mL via INTRAVENOUS

## 2016-04-13 NOTE — Patient Instructions (Signed)
[place] at [facility] Discharge Instructions  RECOMMENDATIONS MADE BY THE CONSULTANT AND ANY TEST RESULTS WILL BE SENT TO YOUR REFERRING PHYSICIAN.  Port flush and lab work today as ordered. Return as scheduled.  Thank you for choosing [place] at [facility] to provide your oncology and hematology care.  To afford each patient quality time with our provider, please arrive at least 15 minutes before your scheduled appointment time.   Beginning January 23rd 2017 lab work for the [facility] will be done in the  Main lab at [facility] on 1st floor. If you have a lab appointment with the [facility] please come in thru the  Main Entrance and check in at the main information desk  You need to re-schedule your appointment should you arrive 10 or more minutes late.  We strive to give you quality time with our providers, and arriving late affects you and other patients whose appointments are after yours.  Also, if you no show three or more times for appointments you may be dismissed from the clinic at the providers discretion.     Again, thank you for choosing [facility].  Our hope is that these requests will decrease the amount of time that you wait before being seen by our physicians.       _____________________________________________________________  Should you have questions after your visit to [facility], please contact our office at [phone] between the hours of 8:30 a.m. and 4:30 p.m.  Voicemails left after 4:30 p.m. will not be returned until the following business day.  For prescription refill requests, have your pharmacy contact our office.         Resources For Cancer Patients and their Caregivers ? American Cancer Society: Can assist with transportation, wigs, general needs, runs Look Good Feel Better.        (309)304-6192 ? Cancer Care: Provides financial assistance, online support groups,  medication/co-pay assistance.  1-800-813-HOPE (281)077-8559) ? [place] Assists [place] cancer patients and their families through emotional , educational and financial support.  (516) 066-2118 ? [place] DSS Where to apply for food stamps, Medicaid and utility assistance. 872-362-4629 ? [facility]: Transportation to medical appointments. (510)294-5787 ? Social Security Administration: May apply for disability if have a Stage IV cancer. 571-756-4075 773-135-5604 ? [employer], Disability and Transit Services: Assists with nutrition, care and transit needs. [place] Support Programs: @10RELATIVEDAYS @ > Cancer Support Group  2nd Tuesday of the month 1pm-2pm, Journey Room  > Creative Journey  3rd Tuesday of the month 1130am-1pm, Journey Room  > Look Good Feel Better  1st Wednesday of the month 10am-12 noon, Journey Room (Call [place] to register [phone])

## 2016-04-13 NOTE — Progress Notes (Signed)
Abigail Wagner presented for Portacath access and flush. Proper placement of portacath confirmed by CXR. Portacath located right chest wall accessed with  H 20 needle. Good blood return present. Portacath flushed with 45ml NS and 500U/19ml Heparin and needle removed intact. Procedure without incident. Patient tolerated procedure well.

## 2016-04-14 LAB — CA 125: CA 125: 7.7 U/mL (ref 0.0–38.1)

## 2016-04-14 LAB — CEA: CEA: 5.8 ng/mL — AB (ref 0.0–4.7)

## 2016-04-30 ENCOUNTER — Encounter (HOSPITAL_COMMUNITY): Payer: Self-pay | Admitting: Hematology & Oncology

## 2016-05-10 DIAGNOSIS — I10 Essential (primary) hypertension: Secondary | ICD-10-CM | POA: Diagnosis not present

## 2016-05-10 DIAGNOSIS — Z1389 Encounter for screening for other disorder: Secondary | ICD-10-CM | POA: Diagnosis not present

## 2016-05-10 DIAGNOSIS — F329 Major depressive disorder, single episode, unspecified: Secondary | ICD-10-CM | POA: Diagnosis not present

## 2016-05-10 DIAGNOSIS — Z6820 Body mass index (BMI) 20.0-20.9, adult: Secondary | ICD-10-CM | POA: Diagnosis not present

## 2016-05-10 DIAGNOSIS — F411 Generalized anxiety disorder: Secondary | ICD-10-CM | POA: Diagnosis not present

## 2016-05-10 DIAGNOSIS — F410 Panic disorder [episodic paroxysmal anxiety] without agoraphobia: Secondary | ICD-10-CM | POA: Diagnosis not present

## 2016-05-24 ENCOUNTER — Other Ambulatory Visit: Payer: Self-pay | Admitting: General Surgery

## 2016-05-24 DIAGNOSIS — N823 Fistula of vagina to large intestine: Secondary | ICD-10-CM | POA: Diagnosis not present

## 2016-05-24 NOTE — H&P (Signed)
History of Present Illness Abigail Ruff MD; 99991111 2:25 PM) The patient is a 65 year old female who presents with rectovaginal fistula. Patient presents to the office with complaints of stool per fragile enough for the past 3 months. She was seen by her gynecologist who is treating her ovarian cancer. A CT scan of the pelvis was ordered. This was done with rectal contrast. An obvious rectovaginal fistula was seen distally. An exam under anesthesia was performed which showed a mass. Biopsies confirmed squamous cell carcinoma. She was then treated for this and the mass has cleared. She continues to have some vaginal drainage. There is minimal change since last month.   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 Knee Surgery Left. Cesarean Section - 1 Hysterectomy (not due to cancer) - Partial  Diagnostic Studies History Abigail Ruff, MD; 99991111 2:18 PM) Pap Smear 1-5 years ago Colonoscopy 1-5 years ago Mammogram 1-3 years ago  Allergies Marjean Donna, [place]; 05/24/2016 2:02 PM) No Known Drug Allergies 06/16/2014  Medication History ([name], CMA; 05/24/2016 2:03 PM) Potassium (75MG  Tablet, Oral) Active. ValACYclovir HCl (500MG  Tablet, Oral two times daily) Active. Lansoprazole (15MG  Capsule ER, Oral) Active. MetroNIDAZOLE (0.75% Gel, Vaginal) Active. ValACYclovir HCl (1GM Tablet, Oral) Active. Ativan (1MG  Tablet, Oral) Active. Voltaren (75MG  Tablet DR, Oral) Active. Altabax (1% Ointment, External)  Active. Vitamin C ER (500MG  Tablet ER, Oral) Active. Estrace (2MG  Tablet, Oral) Active. Folic Acid (0000000 Tablet, Oral) Active. Norco (5-325MG  Tablet, Oral) Active. Prevacid (15MG  Capsule DR, Oral) Active. B Complex (Oral) Specific dose unknown - Active. Bentyl (10MG  Capsule, Oral) Active. Depakote ER (250MG  Tablet ER 24HR, Oral) Active. Lexapro (20MG  Tablet, Oral) Active. Anusol-HC (2.5% Cream, Rectal) Active. RA Krill Oil (500MG  Capsule, Oral) Active. Metoprolol Tartrate (25MG  Tablet, Oral) Active. Multiple Vitamin (Oral) Active. Divalproex Sodium ER (500MG  Tablet ER 24HR, Oral) Active. Medications Reconciled  Social History Abigail Ruff, MD; 99991111 2:18 PM) Alcohol use Recently quit alcohol use. Caffeine use Carbonated beverages, Coffee. Illicit drug use Recently quit drug use. Tobacco use Current every day smoker.  Family History Abigail Ruff, MD; 99991111 2:18 PM) Migraine Headache Sister. Depression Daughter, Mother. Hypertension Father. Heart disease in female family member before age 85 Ischemic Bowel Disease Mother. Heart Disease Brother, Father, Sister.  Pregnancy / Birth History Abigail Ruff, MD; 99991111 2:18 PM) Age at menarche 20 years. Gravida 3 Contraceptive History Oral contraceptives. Para 3 Maternal age 40-20 Age of menopause 46-60    Vitals ([place]; 05/24/2016 2:02 PM) 05/24/2016 2:02 PM Weight: 126 lb Height: 65in Body Surface Area: 1.63 m Body Mass Index: 20.97 kg/m  Temp.: 97.13F(Temporal)  Pulse: 79 (Regular)  BP: 128/80 (Sitting, Left Arm, Standard)      Physical Exam Abigail Ruff MD; 99991111 2:26 PM)  General Note: Pleasant. Looks good. No distress. Husband is with her.  Head and Neck Note: Neck reveals no adenopathy or mass. Good range of motion. Clavicles normal without deformity.  Chest and Lung Exam Note: Clear to auscultation  bilaterally  Cardiovascular Note: regular rate and rhythm. No murmur. No ectopy.  Abdomen Note: Soft. Not distended. Portin right upper quadrant without infection. Lower midline incision well healed.  Rectal Anorectal Exam External - normal external exam. Internal - Note: Small defect elevated anteriorly, distal anal canal.  Neurologic Neurologic evaluation reveals -alert and oriented x 3 with no impairment of recent or remote memory.    Assessment & Plan Abigail Ruff MD; 99991111 2:26 PM)  RECTOVAGINAL FISTULA (N82.3) Impression: 65 year old female who presents to the office with a history of ovarian cancer and rectovaginal fistula. She has noticed no change in her symptoms over the past month. We have decided to go ahead and proceed with surgical closure when mucosal advancement flap. She has agreed to stop smoking and to continue to stop smoking for 6 weeks after surgery at least, to help the area heal. We discussed that there is a high chance that this could recur if the area does not heal, but we will take all measures to help prevent this from happening.

## 2016-05-26 NOTE — Progress Notes (Signed)
[name]  Progress Note  Patient Care Team: Caren Macadam, MD as PCP - General (Family Medicine)  CHIEF COMPLAINTS:  Squamous cell carcinoma of the anus completed therapy 11/2015, per records 5-FU/mitomycin dose reduced with cycle #2 Recto-vaginal fistula Ovarian carcinoma L, Stage IIIA Abdominal carcinomatosis Exploratory laparotomy, omentectomy, Bilateral salpingo-oophorectomy, intraperitoneal PORT placement 07/03/2014 Dr. Alycia Rossetti, R0 resection Intraperitoneal port removal and port a cath placement by Dr. Dalbert Batman 08/17/2014 Post -op (adjuvant) Carboplatin/Taxol X 6 cycles Colonoscopy with Dr. Laural Golden 10/07/2015 Exam under anesthesia/placement of seton/biopsy of rectovaginal mass with Dr. Marcello Moores 09/09/2015  HISTORY OF PRESENTING ILLNESS:  Abigail Wagner 65 y.o. female is here because of a history of anal carcinoma presenting as a rectovaginal mass having just completed concurrent therapy in March of this year and ovarian carcinoma having completed surgery and 6 cycles of Carboplatin/Taxol last year.   Initially, she was out shopping then felt as though she had soiled her pants. She was experiencing abdominal pain and went to see her GYN. This was when a fistula was found. Per available records she ultimately underwent an exam under anesthesia with Dr. Marcello Moores where a rectovaginal mass was noted. She was ultimately diagnosed with anal carcinoma.  She has a history of ovarian cancer on the left, first diagnosed in 2015. She had been having pain on the right abdominal side and knew most of her vital organs were on that side so she saw her PCP. She was also feeling very bloated after eating. Her PCP sent her for a CT scan where the cancer was discovered. She was then sent to [facility] for surgery with Dr. Alycia Rossetti.   Abigail Wagner is unaccompanied.  She is doing pretty well.. She has stopped smoking for 1 week.   Her bowels are okay with dicyclomine.   She was contacted  about setting a follow up appointment with Dr. Lisbeth Renshaw, however she is not sure if she needs to attend this appointment or not.   She's going to have surgical closure of her rectal vaginal fistula. She has quit smoking in preparation for this.   She plans to schedule a screening mammogram this month.    MEDICAL HISTORY:  Past Medical History:  Diagnosis Date  . Carcinoma of ovary, stage 3 [facility]) oncologist-  dr gehrig/  dr Doreene Eland (cancer center in [place] w/ [facility])--  no recurrency   dx 09/ 2015  Stage IIIB  papillary ovarian carcinoma  s/p  omentectomy and BSO &  chemotherapy (completed 12-07-2014)  . DDD (degenerative disc disease), lumbosacral   . GERD (gastroesophageal reflux disease)   . Headache(784.0)   . History of acute respiratory failure    05-30-2009  drug overdose-- (intubated for 2 days)  and aspiration pneumonia  . History of herpes genitalis   . History of kidney stones   . History of suicide attempt    per documentation in epic  05-30-2009  overdose benaodiazepine  . HTN (hypertension)    takes Metoprolol daily  . IBS (irritable bowel syndrome)    takes Bentyl daily  . Major depression ([place])   . Mood disorder ([place])   . OA (osteoarthritis)   . Prolapse of vaginal vault after hysterectomy 07/20/2015  . Rectal mass 08/03/2015  . Rectovaginal fistula 08/05/2015  . Sigmoid diverticulosis   . Smokers' cough ([place])     SURGICAL HISTORY: Past Surgical History:  Procedure Laterality Date  . BREAST ENHANCEMENT SURGERY  1986  . [place]  .  COLONOSCOPY    . COLONOSCOPY N/A 10/07/2015   Procedure: COLONOSCOPY;  Surgeon: Rogene Houston, MD;  [facility];  Service: Endoscopy;  Laterality: N/A;  7:30  . EXPLORATORY LAPAROTOMY/ OMENTECTOMY/  BILATERAL SALPINGOOPHORECTOMY/  [facility] PLACEMENT  07-03-2014   [facility]  . KNEE ARTHROSCOPY Left 09-22-2004  . LAPAROSCOPY N/A 06/02/2014   Procedure: DIAGNOSTIC LAPAROSCOPY, OMENTAL BIOPSY, RIGHT OVARY  BIOPSY, LYSIS OF ADHESIONS;  Surgeon: Fanny Skates, MD;  Location: [place];  Service: General;  Laterality: N/A;  . PLACEMENT OF SETON N/A 09/09/2015   Procedure: PLACEMENT OF SETON;  Surgeon: Leighton Ruff, MD;  Location: [place];  Service: General;  Laterality: N/A;  . PORT-A-CATH REMOVAL Right 08/17/2014   Procedure: REMOVAL INTRAPERITONEAL CHEMO PORT;  Surgeon: Fanny Skates, MD;  Location: [place];  Service: General;  Laterality: Right;  . PORTACATH PLACEMENT Right 08/17/2014   Procedure:  PLACE NEW PORT A CATH;  Surgeon: Fanny Skates, MD;  Location: [place];  Service: General;  Laterality: Right;  . RECTAL BIOPSY N/A 09/09/2015   Procedure: BIOPSY OF RECTOVAGINAL MASS;  Surgeon: Leighton Ruff, MD;  Location: [place];  Service: General;  Laterality: N/A;  . TUBAL LIGATION  YRS AGO  . VAGINAL HYSTERECTOMY  1981    SOCIAL HISTORY: Social History   Social History  . Marital status: Married    Spouse name: N/A  . Number of children: N/A  . Years of education: N/A   Occupational History  . Not on file.   Social History Main Topics  . Smoking status: Former Smoker    Packs/day: 1.00    Years: 43.00    Types: Cigarettes    Quit date: 05/24/2016  . Smokeless tobacco: Never Used  . Alcohol use No  . Drug use: No  . Sexual activity: Not on file   Other Topics Concern  . Not on file   Social History Narrative  . No narrative on file   Married 26 years 5 children between them 7 grandchildren 1 great grandchild Smoker, 1/2 ppd.  ETOH, quit 2 years ago. Born in [place], moved here in 1987 She worked at [employer].  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 She enjoys watching television  FAMILY HISTORY: Family History  Problem Relation Age of Onset  . Bipolar disorder Mother   . Anxiety disorder Mother   . Dementia Mother   . Alcohol abuse Paternal Uncle   . Bipolar  disorder Maternal Grandmother   . Dementia Maternal Grandmother   . Alcohol abuse Paternal Uncle   . Stroke Brother   . Deep vein thrombosis Son   . ADD / ADHD Neg Hx   . Drug abuse Neg Hx   . Depression Neg Hx   . OCD Neg Hx   . Paranoid behavior Neg Hx   . Schizophrenia Neg Hx   . Seizures Neg Hx   . Sexual abuse Neg Hx   . Physical abuse Neg Hx    Mother deceased at 3 yo with Alzheimer's Father deceased of a massive stroke at 4 yo 3 brothers and 1 sister. The patient is the youngest Sister is the oldest  Oldest brother had a quadruple bypass years ago Next brother died of a stroke a few years ago The next brother has some heart issues but is doing fine.  ALLERGIES:  has No Known Allergies.  MEDICATIONS:  Current Outpatient Prescriptions  Medication Sig  Dispense Refill  . Aspirin-Salicylamide-Caffeine (BC HEADACHE POWDER PO) Take 1 packet by mouth as needed.     Marland Kitchen b complex vitamins tablet Take 1 tablet by mouth daily.    Marland Kitchen dicyclomine (BENTYL) 10 MG capsule 10 to 20 mg by mouth 4 times daily as needed 120 capsule 3  . divalproex (DEPAKOTE) 250 MG DR tablet Take 1 tablet (250 mg total) by mouth every evening. 90 tablet 1  . escitalopram (LEXAPRO) 20 MG tablet Take 1 tablet (20 mg total) by mouth every morning. 90 tablet 1  . KRILL OIL PO Take 350 mg by mouth daily.    . lansoprazole (PREVACID) 15 MG capsule Take 15 mg by mouth as needed.     Marland Kitchen LORazepam (ATIVAN) 1 MG tablet Take 1 tablet (1 mg total) by mouth 2 (two) times daily. 60 tablet 3  . metroNIDAZOLE (METROGEL) 0.75 % vaginal gel PLACE 1 APPLICATORFUL VAGINALLY AT BEDTIME PRN 70 g 2  . Multiple Vitamin (MULTIVITAMIN) tablet Take 1 tablet by mouth daily.    . valACYclovir (VALTREX) 1000 MG tablet Take 1,000 mg by mouth daily.  0   No current facility-administered medications for this visit.     Review of Systems  Constitutional: Positive for malaise/fatigue.  HENT: Negative.   Eyes: Negative.   Respiratory:  Negative.   Cardiovascular: Negative.   Gastrointestinal: Negative.   Genitourinary: Negative.   Musculoskeletal: Negative.   Skin: Negative.   Neurological: Negative.   Endo/Heme/Allergies: Negative.   Psychiatric/Behavioral: Negative.   All other systems reviewed and are negative.  14 point ROS was done and is otherwise as detailed above or in HPI   PHYSICAL EXAMINATION: ECOG PERFORMANCE STATUS: 1 - Symptomatic but completely ambulatory  Vitals:   05/31/16 1437  BP: (!) 135/59  Pulse: 86  Resp: 16  Temp: 97.6 F (36.4 C)   Filed Weights   05/31/16 1437  Weight: 130 lb (59 kg)     Physical Exam  Constitutional: She is oriented to person, place, and time and well-developed, well-nourished, and in no distress.  HENT:  Head: Normocephalic and atraumatic.  Nose: Nose normal.  Mouth/Throat: Oropharynx is clear and moist. No oropharyngeal exudate.  Eyes: Conjunctivae and EOM are normal. Pupils are equal, round, and reactive to light. Right eye exhibits no discharge. Left eye exhibits no discharge. No scleral icterus.  Neck: Normal range of motion. Neck supple. No tracheal deviation present. No thyromegaly present.  Cardiovascular: Normal rate, regular rhythm and normal heart sounds.  Exam reveals no gallop and no friction rub.   No murmur heard. Pulmonary/Chest: Effort normal and breath sounds normal. She has no wheezes. She has no rales.  Abdominal: Soft. Bowel sounds are normal. She exhibits no distension and no mass. There is no tenderness. There is no rebound and no guarding.  Musculoskeletal: Normal range of motion. She exhibits no edema.  Lymphadenopathy:    She has no cervical adenopathy.  Neurological: She is alert and oriented to person, place, and time. She has normal reflexes. No cranial nerve deficit. Gait normal. Coordination normal.  Skin: Skin is warm and dry. No rash noted.  Psychiatric: Mood, memory, affect and judgment normal.  Nursing note and vitals  reviewed.   LABORATORY DATA:  I have reviewed the data as listed Lab Results  Component Value Date   WBC 3.6 (L) 05/31/2016   HGB 11.6 (L) 05/31/2016   HCT 34.0 (L) 05/31/2016   MCV 94.7 05/31/2016   PLT 234 05/31/2016  CMP     Component Value Date/Time   NA 138 05/31/2016 1537   K 4.5 05/31/2016 1537   CL 102 05/31/2016 1537   CO2 27 05/31/2016 1537   GLUCOSE 75 05/31/2016 1537   BUN 23 (H) 05/31/2016 1537   CREATININE 0.42 (L) 05/31/2016 1537   CREATININE 0.54 04/15/2014 1027   CALCIUM 9.5 05/31/2016 1537   PROT 6.3 (L) 05/31/2016 1537   ALBUMIN 3.9 05/31/2016 1537   AST 24 05/31/2016 1537   ALT 20 05/31/2016 1537   ALKPHOS 59 05/31/2016 1537   BILITOT 0.3 05/31/2016 1537   GFRNONAA >60 05/31/2016 1537   GFRAA >60 05/31/2016 1537     Results for Abigail Wagner, Abigail Wagner (MRN [id number]) as of 06/02/2016 21:24  Ref. Range 04/13/2016 11:54 05/31/2016 15:37  CA 125 Latest Ref Range: 0.0 - 38.1 U/mL 7.7 7.5  CEA Latest Ref Range: 0.0 - 4.7 ng/mL 5.8 (H)     PATHOLOGY:         RADIOGRAPHIC STUDIES: I have personally reviewed the radiological images as listed and agreed with the findings in the report.  Study Result     CLINICAL DATA: Initial treatment strategy for squamous cell carcinoma of the anus diagnosed on 09/09/2015 biopsy. Additional history of serous ovarian carcinoma diagnosed on 06/02/2014 treated with chemotherapy.  EXAM: NUCLEAR MEDICINE PET SKULL BASE TO THIGH  TECHNIQUE: 6.9 mCi F-18 FDG was injected intravenously. Full-ring PET imaging was performed from the skull base to thigh after the radiotracer. CT data was obtained and used for attenuation correction and anatomic localization.  FASTING BLOOD GLUCOSE: Value: 90 mg/dl  COMPARISON: 09/22/2015 CT chest, abdomen and pelvis.  FINDINGS: NECK  No hypermetabolic lymph nodes in the neck. Symmetric hypermetabolism in the glottis is probably physiologic.  CHEST  No hypermetabolic  axillary, mediastinal or hilar nodes. Right internal jugular MediPort terminates at the cavoatrial junction. Bilateral breast prostheses, which demonstrate intracapsular rupture. No significant pulmonary nodules or acute consolidative airspace disease. Mild biapical pleural-parenchymal scarring is stable since 01/08/2015.  ABDOMEN/PELVIS  There is focal hypermetabolism with max SUV 8.9 with associated ill-defined soft tissue fullness at the junction of the anterior anal wall and posterior inferior vaginal wall (series 4/image 191).  No abnormal hypermetabolic activity within the liver, pancreas, adrenal glands, or spleen. No hypermetabolic lymph nodes in the abdomen or pelvis. Status post hysterectomy, with no abnormal findings at the vaginal cuff. No adnexal mass. No hypermetabolic peritoneal tumor nodules.  Subcapsular calcification in the posterior spleen is stable and likely from remote insult. Stable small diverticulum at the posterior gastric fundus. Nonobstructing 3 mm stone in the lower right kidney. Nonobstructing 2 mm stone in the lower left kidney. Mild sigmoid diverticulosis. Atherosclerotic nonaneurysmal abdominal aorta.  SKELETON  No focal hypermetabolic activity to suggest skeletal metastasis.  IMPRESSION: 1. Focal hypermetabolism with associated ill-defined soft tissue fullness at the junction of the anterior anal wall and posterior inferior vaginal wall, in keeping with the provided history of a primary anal carcinoma. 2. No hypermetabolic locoregional or distant metastatic disease. 3. Status post hysterectomy, with no abnormal findings at the vaginal cuff. No evidence of hypermetabolic peritoneal tumor. 4. Tiny nonobstructing bilateral renal stones.   Electronically Signed  By: Ilona Sorrel M.D.  On: 10/15/2015 13:35    No results found.  PATHOLOGY   ASSESSMENT & PLAN:  Anal carcinoma Stage IIIB papillary serous carcinoma of  ovary Rectovaginal fistula Tobacco Use  The patient is here for continuation of care and follow up  for anal cancer and ovarian carcinoma. Overall she is doing well. She seems to be making a good recovery.   She has recently seen Dr. Marcello Moores and is scheduled for upcoming repair of her recto-vaginal fistula. She quit smoking 1 week ago in preparation for this. I have obviously continued to encourage her.  We reviewed guidelines for follow-up of anal carcinoma and ovarian carcinoma.Last colonoscopy on 10/07/2015.I have ordered a screening mammogram for next week.  She is due for port flush today, we will check labs today and get her on a follow-up schedule.   I spoke with the patient about ongoing imaging and follow up scheduling.   NCCN guidelines recommends the following surveillance for anal cancer:  A. DRE every 3-6 months for 5 years  B. Anoscopy every 3-6 months for 5 years  C. Inguinal node palpation every 3-6 months for 5 years  D. T3-T4 or inguinal node positive disease, CT CAP annually x 3 years  Version 2.2015  NCCN guidelines for surveillance of Stage I-IV Epithelial Ovarian Cancer/ Fallopian Tube Cancer/ Primary Peritoneal Cancer in those experiencing a complete response recommends the following (1.2017):  A. Vists every 2-4 months for first 2 years, then 3-6 months for 3 years, and then annually after 5 years.  B. Physical exam including pelvic exam  C. CA-125 or other tumor markers if initially elevated.  D. Refer for genetic risk evaluation if not previously performed.   E. CBC and chemistry profile as indicated.   F. CT Chest/Abdomen/Pelvis, MRI, PET-CT, or PET (skull base to mid-thigh) as clinically indicated.  G. Chest X-ray as indicted.   H. Long-term wellness care.  Labs will be drawn with port flush today.   She will undergo repeat scans the second or third week in December. RTC post scans with labs.   Orders Placed This Encounter  Procedures  . CT Chest W  Contrast    Standing Status:   Future    Standing Expiration Date:   05/31/2017    Order Specific Question:   If indicated for the ordered procedure, I authorize the administration of contrast media per Radiology protocol    Answer:   Yes    Order Specific Question:   Reason for Exam (SYMPTOM  OR DIAGNOSIS REQUIRED)    Answer:   restaging anal carcinoma    Order Specific Question:   Preferred imaging location?    Answer:   [facility]  . CT Abdomen Pelvis W Contrast    Standing Status:   Future    Standing Expiration Date:   05/31/2017    Order Specific Question:   If indicated for the ordered procedure, I authorize the administration of contrast media per Radiology protocol    Answer:   Yes    Order Specific Question:   Reason for Exam (SYMPTOM  OR DIAGNOSIS REQUIRED)    Answer:   restaging anal carcinoma    Order Specific Question:   Preferred imaging location?    Answer:   [place] [facility]    Standing Status:   Future    Standing Expiration Date:   07/31/2017    Order Specific Question:   Reason for Exam (SYMPTOM  OR DIAGNOSIS REQUIRED)    Answer:   screening    Order Specific Question:   Preferred imaging location?    Answer:   [place] [address]    Standing Status:   Future  Standing Expiration Date:   05/31/2017  . Comprehensive metabolic panel    Standing Status:   Future    Standing Expiration Date:   05/31/2017  . CBC with Differential    Standing Status:   Future    Standing Expiration Date:   05/31/2017  . CBC with Differential    Standing Status:   Future    Number of Occurrences:   1    Standing Expiration Date:   05/31/2017  . Comprehensive metabolic panel    Standing Status:   Future    Number of Occurrences:   1    Standing Expiration Date:   05/31/2017  . CA 125    Standing Status:   Future    Number of Occurrences:   1    Standing Expiration Date:   05/31/2017    All questions were answered. The patient  knows to call the clinic with any problems, questions or concerns.  This document serves as a record of services personally performed by Ancil Linsey, MD. It was created on her behalf by Arlyce Harman, a trained medical scribe. The creation of this record is based on the scribe's personal observations and the provider's statements to them. This document has been checked and approved by the attending provider.  I have reviewed the above documentation for accuracy and completeness, and I agree with the above.  This note was electronically signed.    Molli Hazard, MD  06/02/2016 9:24 PM

## 2016-05-31 ENCOUNTER — Encounter (HOSPITAL_COMMUNITY): Payer: 59 | Attending: Hematology & Oncology | Admitting: Hematology & Oncology

## 2016-05-31 ENCOUNTER — Encounter (HOSPITAL_COMMUNITY): Payer: Self-pay | Admitting: Hematology & Oncology

## 2016-05-31 ENCOUNTER — Encounter (HOSPITAL_BASED_OUTPATIENT_CLINIC_OR_DEPARTMENT_OTHER): Payer: 59

## 2016-05-31 VITALS — BP 135/59 | HR 86 | Temp 97.6°F | Resp 16 | Wt 130.0 lb

## 2016-05-31 DIAGNOSIS — C210 Malignant neoplasm of anus, unspecified: Secondary | ICD-10-CM | POA: Insufficient documentation

## 2016-05-31 DIAGNOSIS — C569 Malignant neoplasm of unspecified ovary: Secondary | ICD-10-CM | POA: Diagnosis not present

## 2016-05-31 DIAGNOSIS — Z1239 Encounter for other screening for malignant neoplasm of breast: Secondary | ICD-10-CM

## 2016-05-31 DIAGNOSIS — Z720 Tobacco use: Secondary | ICD-10-CM

## 2016-05-31 DIAGNOSIS — N823 Fistula of vagina to large intestine: Secondary | ICD-10-CM

## 2016-05-31 LAB — COMPREHENSIVE METABOLIC PANEL
ALBUMIN: 3.9 g/dL (ref 3.5–5.0)
ALK PHOS: 59 U/L (ref 38–126)
ALT: 20 U/L (ref 14–54)
ANION GAP: 9 (ref 5–15)
AST: 24 U/L (ref 15–41)
BUN: 23 mg/dL — ABNORMAL HIGH (ref 6–20)
CALCIUM: 9.5 mg/dL (ref 8.9–10.3)
CHLORIDE: 102 mmol/L (ref 101–111)
CO2: 27 mmol/L (ref 22–32)
Creatinine, Ser: 0.42 mg/dL — ABNORMAL LOW (ref 0.44–1.00)
GFR calc non Af Amer: >60 mL/min (ref >60)
GLUCOSE: 75 mg/dL (ref 65–99)
Potassium: 4.5 mmol/L (ref 3.5–5.1)
SODIUM: 138 mmol/L (ref 135–145)
Total Bilirubin: 0.3 mg/dL (ref 0.3–1.2)
Total Protein: 6.3 g/dL — ABNORMAL LOW (ref 6.5–8.1)

## 2016-05-31 LAB — CBC WITH DIFFERENTIAL/PLATELET
BASOS PCT: 1 %
Basophils Absolute: 0 10*3/uL (ref 0.0–0.1)
EOS ABS: 0.3 10*3/uL (ref 0.0–0.7)
EOS PCT: 9 %
HCT: 34 % — ABNORMAL LOW (ref 36.0–46.0)
HEMOGLOBIN: 11.6 g/dL — AB (ref 12.0–15.0)
Lymphocytes Relative: 34 %
Lymphs Abs: 1.2 10*3/uL (ref 0.7–4.0)
MCH: 32.3 pg (ref 26.0–34.0)
MCHC: 34.1 g/dL (ref 30.0–36.0)
MCV: 94.7 fL (ref 78.0–100.0)
Monocytes Absolute: 0.3 10*3/uL (ref 0.1–1.0)
Monocytes Relative: 8 %
NEUTROS PCT: 48 %
Neutro Abs: 1.8 10*3/uL (ref 1.7–7.7)
PLATELETS: 234 10*3/uL (ref 150–400)
RBC: 3.59 MIL/uL — AB (ref 3.87–5.11)
RDW: 13.9 % (ref 11.5–15.5)
WBC: 3.6 10*3/uL — AB (ref 4.0–10.5)

## 2016-05-31 MED ORDER — SODIUM CHLORIDE 0.9% FLUSH
20.0000 mL | INTRAVENOUS | Status: DC | PRN
  Administered 2016-05-31: 20 mL via INTRAVENOUS
  Filled 2016-05-31: qty 20

## 2016-05-31 MED ORDER — HEPARIN SOD (PORK) LOCK FLUSH 100 UNIT/ML IV SOLN
INTRAVENOUS | Status: AC
  Filled 2016-05-31: qty 5

## 2016-05-31 MED ORDER — HEPARIN SOD (PORK) LOCK FLUSH 100 UNIT/ML IV SOLN
500.0000 [IU] | Freq: Once | INTRAVENOUS | Status: AC
  Administered 2016-05-31: 500 [IU] via INTRAVENOUS

## 2016-05-31 NOTE — Patient Instructions (Signed)
[facility] at [facility] Discharge Instructions  RECOMMENDATIONS MADE BY THE CONSULTANT AND ANY TEST RESULTS WILL BE SENT TO YOUR REFERRING PHYSICIAN.  Port flush today.    Thank you for choosing [facility] at [facility] to provide your oncology and hematology care.  To afford each patient quality time with our provider, please arrive at least 15 minutes before your scheduled appointment time.   Beginning January 23rd 2017 lab work for the Cancer Center will be done in the  Main lab at [place] on 1st floor. If you have a lab appointment with the Cancer Center please come in thru the  Main Entrance and check in at the main information desk  You need to re-schedule your appointment should you arrive 10 or more minutes late.  We strive to give you quality time with our providers, and arriving late affects you and other patients whose appointments are after yours.  Also, if you no show three or more times for appointments you may be dismissed from the clinic at the providers discretion.     Again, thank you for choosing [facility].  Our hope is that these requests will decrease the amount of time that you wait before being seen by our physicians.       _____________________________________________________________  Should you have questions after your visit to [facility], please contact our office at [phone] between the hours of 8:30 a.m. and 4:30 p.m.  Voicemails left after 4:30 p.m. will not be returned until the following business day.  For prescription refill requests, have your pharmacy contact our office.         Resources For Cancer Patients and their Caregivers ? American Cancer Society: Can assist with transportation, wigs, general needs, runs Look Good Feel Better.        1-888-227-6333 ? Cancer Care: Provides financial assistance, online support groups, medication/co-pay assistance.   1-800-813-HOPE (4673) ? [facility] Assists [place] cancer patients and their families through emotional , educational and financial support.  336-427-4357 ? [place] DSS Where to apply for food stamps, Medicaid and utility assistance. 336-342-1394 ? [facility]: Transportation to medical appointments. 336-347-2287 ? Social Security Administration: May apply for disability if have a Stage IV cancer. 336-342-7796 1-800-772-1213 ? [place] Aging, Disability and Transit Services: Assists with nutrition, care and transit needs. 336-349-2343  Cancer Center Support Programs: @[email or web address]@ > Cancer Support Group  2nd Tuesday of the month 1pm-2pm, Journey Room  > Creative Journey  3rd Tuesday of the month 1130am-1pm, Journey Room  > Look Good Feel Better  1st Wednesday of the month 10am-12 noon, Journey Room (Call American Cancer Society to register 1-800-395-5775)    

## 2016-05-31 NOTE — Progress Notes (Signed)
Abigail Wagner presented for Portacath access and flush. Proper placement of portacath confirmed by CXR. Portacath located right chest wall accessed with  H 20 needle. Good blood return present.  Specimen drawn for labs.  Portacath flushed with 45ml NS and 500U/17ml Heparin and needle removed intact. Procedure without incident. Patient tolerated procedure well.

## 2016-05-31 NOTE — Patient Instructions (Addendum)
[place] at [facility] Discharge Instructions  RECOMMENDATIONS MADE BY THE CONSULTANT AND ANY TEST RESULTS WILL BE SENT TO YOUR REFERRING PHYSICIAN.  You saw Dr. Whitney Muse today. Labs and port flush today. CT chest/ abdomen and pelvis in December. Mammogram next week. Follow up in December after CT with labs.  Thank you for choosing [place] at [facility] to provide your oncology and hematology care.  To afford each patient quality time with our provider, please arrive at least 15 minutes before your scheduled appointment time.   Beginning January 23rd 2017 lab work for the [facility] will be done in the  Main lab at [facility] on 1st floor. If you have a lab appointment with the [facility] please come in thru the  Main Entrance and check in at the main information desk  You need to re-schedule your appointment should you arrive 10 or more minutes late.  We strive to give you quality time with our providers, and arriving late affects you and other patients whose appointments are after yours.  Also, if you no show three or more times for appointments you may be dismissed from the clinic at the providers discretion.     Again, thank you for choosing [facility].  Our hope is that these requests will decrease the amount of time that you wait before being seen by our physicians.       _____________________________________________________________  Should you have questions after your visit to [facility], please contact our office at [phone] between the hours of 8:30 a.m. and 4:30 p.m.  Voicemails left after 4:30 p.m. will not be returned until the following business day.  For prescription refill requests, have your pharmacy contact our office.         Resources For Cancer Patients and their Caregivers ? American Cancer Society: Can assist with transportation, wigs, general needs, runs Look Good Feel  Better.        949-491-4799 ? Cancer Care: Provides financial assistance, online support groups, medication/co-pay assistance.  1-800-813-HOPE 276-303-1605) ? [place] Assists [place] cancer patients and their families through emotional , educational and financial support.  778-146-3736 ? [place] DSS Where to apply for food stamps, Medicaid and utility assistance. (815)456-8870 ? [employer]: Transportation to medical appointments. (586)259-6674 ? Social Security Administration: May apply for disability if have a Stage IV cancer. (213) 451-8950 734-460-5666 ? [employer], Disability and Transit Services: Assists with nutrition, care and transit needs. [facility] Support Programs: @10RELATIVEDAYS @ > Cancer Support Group  2nd Tuesday of the month 1pm-2pm, Journey Room  > Creative Journey  3rd Tuesday of the month 1130am-1pm, Journey Room  > Look Good Feel Better  1st Wednesday of the month 10am-12 noon, Journey Room (Call [place] to register [phone])

## 2016-06-01 ENCOUNTER — Telehealth (HOSPITAL_COMMUNITY): Payer: Self-pay | Admitting: *Deleted

## 2016-06-01 LAB — CA 125: CA 125: 7.5 U/mL (ref 0.0–38.1)

## 2016-06-01 NOTE — Telephone Encounter (Signed)
Pt aware CA125 is excellent.

## 2016-06-01 NOTE — Telephone Encounter (Signed)
-----   Message from Patrici Ranks, MD sent at 06/01/2016  2:54 PM EDT ----- Advise CA 125 is 7.5 which is excellent. Dr.P

## 2016-06-02 ENCOUNTER — Encounter (HOSPITAL_COMMUNITY): Payer: Self-pay | Admitting: Hematology & Oncology

## 2016-06-08 ENCOUNTER — Other Ambulatory Visit (HOSPITAL_COMMUNITY): Payer: Self-pay

## 2016-06-09 ENCOUNTER — Ambulatory Visit (HOSPITAL_COMMUNITY)
Admission: RE | Admit: 2016-06-09 | Discharge: 2016-06-09 | Disposition: A | Discharge status: Home / Self Care | Payer: Medicare Other | Source: Ambulatory Visit | Attending: Obstetrics and Gynecology | Admitting: Obstetrics and Gynecology

## 2016-06-09 DIAGNOSIS — Z9882 Breast implant status: Secondary | ICD-10-CM | POA: Diagnosis not present

## 2016-06-09 DIAGNOSIS — Z1231 Encounter for screening mammogram for malignant neoplasm of breast: Secondary | ICD-10-CM | POA: Diagnosis not present

## 2016-06-12 ENCOUNTER — Encounter (HOSPITAL_BASED_OUTPATIENT_CLINIC_OR_DEPARTMENT_OTHER): Payer: Self-pay | Admitting: *Deleted

## 2016-06-12 NOTE — Progress Notes (Signed)
NPO AFTER MN.  ARRIVE AT 0900.  CURRENT LAB RESULTS IN CHART AND EPIC.  WILL TAKE ATIVAN, LEXAPRO, VALTREX, AND PREVACID AM DOS W/ SIPS OF WATER.

## 2016-06-15 ENCOUNTER — Ambulatory Visit (HOSPITAL_BASED_OUTPATIENT_CLINIC_OR_DEPARTMENT_OTHER): Payer: Medicare Other | Admitting: Anesthesiology

## 2016-06-15 ENCOUNTER — Encounter (HOSPITAL_BASED_OUTPATIENT_CLINIC_OR_DEPARTMENT_OTHER)
Admission: RE | Disposition: A | Discharge status: Home / Self Care | Payer: Self-pay | Source: Ambulatory Visit | Attending: General Surgery

## 2016-06-15 ENCOUNTER — Ambulatory Visit (HOSPITAL_BASED_OUTPATIENT_CLINIC_OR_DEPARTMENT_OTHER)
Admission: RE | Admit: 2016-06-15 | Discharge: 2016-06-15 | Disposition: A | Discharge status: Home / Self Care | Payer: Medicare Other | Source: Ambulatory Visit | Attending: General Surgery | Admitting: General Surgery

## 2016-06-15 ENCOUNTER — Encounter (HOSPITAL_BASED_OUTPATIENT_CLINIC_OR_DEPARTMENT_OTHER): Payer: Self-pay | Admitting: Anesthesiology

## 2016-06-15 DIAGNOSIS — F419 Anxiety disorder, unspecified: Secondary | ICD-10-CM | POA: Diagnosis not present

## 2016-06-15 DIAGNOSIS — K219 Gastro-esophageal reflux disease without esophagitis: Secondary | ICD-10-CM | POA: Insufficient documentation

## 2016-06-15 DIAGNOSIS — Z9221 Personal history of antineoplastic chemotherapy: Secondary | ICD-10-CM | POA: Insufficient documentation

## 2016-06-15 DIAGNOSIS — Z85048 Personal history of other malignant neoplasm of rectum, rectosigmoid junction, and anus: Secondary | ICD-10-CM | POA: Diagnosis not present

## 2016-06-15 DIAGNOSIS — F17200 Nicotine dependence, unspecified, uncomplicated: Secondary | ICD-10-CM | POA: Diagnosis not present

## 2016-06-15 DIAGNOSIS — N823 Fistula of vagina to large intestine: Secondary | ICD-10-CM | POA: Diagnosis not present

## 2016-06-15 DIAGNOSIS — Z79899 Other long term (current) drug therapy: Secondary | ICD-10-CM | POA: Insufficient documentation

## 2016-06-15 DIAGNOSIS — I10 Essential (primary) hypertension: Secondary | ICD-10-CM | POA: Insufficient documentation

## 2016-06-15 DIAGNOSIS — Z923 Personal history of irradiation: Secondary | ICD-10-CM | POA: Insufficient documentation

## 2016-06-15 DIAGNOSIS — G4730 Sleep apnea, unspecified: Secondary | ICD-10-CM | POA: Diagnosis not present

## 2016-06-15 DIAGNOSIS — F329 Major depressive disorder, single episode, unspecified: Secondary | ICD-10-CM | POA: Insufficient documentation

## 2016-06-15 DIAGNOSIS — Z8543 Personal history of malignant neoplasm of ovary: Secondary | ICD-10-CM | POA: Diagnosis not present

## 2016-06-15 HISTORY — PX: MUCOSAL ADVANCEMENT FLAP: SHX6479

## 2016-06-15 HISTORY — DX: Malignant neoplasm of anus, unspecified: C21.0

## 2016-06-15 HISTORY — DX: Personal history of colonic polyps: Z86.010

## 2016-06-15 SURGERY — CREATION, FLAP, MUCOSAL ADVANCEMENT
Anesthesia: Monitor Anesthesia Care | Site: Anus

## 2016-06-15 MED ORDER — PROPOFOL 10 MG/ML IV BOLUS
INTRAVENOUS | Status: DC | PRN
  Administered 2016-06-15: 20 mg via INTRAVENOUS

## 2016-06-15 MED ORDER — BUPIVACAINE-EPINEPHRINE (PF) 0.5% -1:200000 IJ SOLN
INTRAMUSCULAR | Status: DC | PRN
  Administered 2016-06-15: 30 mL

## 2016-06-15 MED ORDER — LIDOCAINE 2% (20 MG/ML) 5 ML SYRINGE
INTRAMUSCULAR | Status: DC | PRN
  Administered 2016-06-15: 40 mg via INTRAVENOUS

## 2016-06-15 MED ORDER — LACTATED RINGERS IV SOLN
INTRAVENOUS | Status: DC
  Administered 2016-06-15: 10:00:00 via INTRAVENOUS
  Filled 2016-06-15: qty 1000

## 2016-06-15 MED ORDER — FENTANYL CITRATE (PF) 100 MCG/2ML IJ SOLN
INTRAMUSCULAR | Status: DC | PRN
  Administered 2016-06-15: 50 ug via INTRAVENOUS

## 2016-06-15 MED ORDER — MIDAZOLAM HCL 2 MG/2ML IJ SOLN
INTRAMUSCULAR | Status: AC
  Filled 2016-06-15: qty 2

## 2016-06-15 MED ORDER — PROPOFOL 500 MG/50ML IV EMUL
INTRAVENOUS | Status: AC
  Filled 2016-06-15: qty 50

## 2016-06-15 MED ORDER — LIDOCAINE 2% (20 MG/ML) 5 ML SYRINGE
INTRAMUSCULAR | Status: AC
  Filled 2016-06-15: qty 5

## 2016-06-15 MED ORDER — PROPOFOL 500 MG/50ML IV EMUL
INTRAVENOUS | Status: DC | PRN
  Administered 2016-06-15: 200 ug/kg/min via INTRAVENOUS

## 2016-06-15 MED ORDER — MIDAZOLAM HCL 5 MG/5ML IJ SOLN
INTRAMUSCULAR | Status: DC | PRN
  Administered 2016-06-15 (×2): 1 mg via INTRAVENOUS

## 2016-06-15 MED ORDER — OXYCODONE HCL 5 MG PO TABS: 5.0000 mg | ORAL_TABLET | ORAL | 0 refills | Status: DC | PRN

## 2016-06-15 MED ORDER — FENTANYL CITRATE (PF) 100 MCG/2ML IJ SOLN
INTRAMUSCULAR | Status: AC
  Filled 2016-06-15: qty 2

## 2016-06-15 SURGICAL SUPPLY — 42 items
BLADE HEX COATED 2.75 (ELECTRODE) ×2 IMPLANT
BLADE SURG 15 STRL LF DISP TIS (BLADE) ×1 IMPLANT
BLADE SURG 15 STRL SS (BLADE) ×1
BRIEF STRETCH FOR OB PAD LRG (UNDERPADS AND DIAPERS) ×2 IMPLANT
CANISTER SUCTION 2500CC (MISCELLANEOUS) ×2 IMPLANT
CANNULA VESSEL W/WING WO/VALVE (CANNULA) IMPLANT
COVER BACK TABLE 60X90IN (DRAPES) ×2 IMPLANT
DRAPE LAPAROTOMY T 102X78X121 (DRAPES) ×2 IMPLANT
DRAPE UTILITY XL STRL (DRAPES) ×2 IMPLANT
DRSG PAD ABDOMINAL 8X10 ST (GAUZE/BANDAGES/DRESSINGS) ×2 IMPLANT
ELECT BLADE 6.5 .24CM SHAFT (ELECTRODE) IMPLANT
ELECT BLADE TIP CTD 4 INCH (ELECTRODE) IMPLANT
ELECT REM PT RETURN 9FT ADLT (ELECTROSURGICAL) ×2
ELECTRODE REM PT RTRN 9FT ADLT (ELECTROSURGICAL) ×1 IMPLANT
GLOVE BIO SURGEON STRL SZ 6.5 (GLOVE) ×4 IMPLANT
GOWN STRL REUS W/ TWL XL LVL3 (GOWN DISPOSABLE) ×1 IMPLANT
GOWN STRL REUS W/TWL 2XL LVL3 (GOWN DISPOSABLE) ×2 IMPLANT
GOWN STRL REUS W/TWL XL LVL3 (GOWN DISPOSABLE) ×1
IV CATH 14GX2 1/4 (CATHETERS) IMPLANT
KIT ROOM TURNOVER WOR (KITS) ×2 IMPLANT
NEEDLE HYPO 22GX1.5 SAFETY (NEEDLE) ×2 IMPLANT
NS IRRIG 500ML POUR BTL (IV SOLUTION) ×2 IMPLANT
PACK BASIN DAY SURGERY FS (CUSTOM PROCEDURE TRAY) ×2 IMPLANT
PAD ABD 8X10 STRL (GAUZE/BANDAGES/DRESSINGS) IMPLANT
PAD ARMBOARD 7.5X6 YLW CONV (MISCELLANEOUS) ×2 IMPLANT
PENCIL BUTTON HOLSTER BLD 10FT (ELECTRODE) ×2 IMPLANT
SPONGE GAUZE 4X4 12PLY STER LF (GAUZE/BANDAGES/DRESSINGS) ×2 IMPLANT
SUCTION FRAZIER HANDLE 10FR (MISCELLANEOUS) ×1
SUCTION TUBE FRAZIER 10FR DISP (MISCELLANEOUS) ×1 IMPLANT
SUT CHROMIC 2 0 SH (SUTURE) ×2 IMPLANT
SUT GUT CHROMIC 3 0 (SUTURE) ×4 IMPLANT
SUT SILK 2 0 SH (SUTURE) ×2 IMPLANT
SUT VIC AB 2-0 SH 18 (SUTURE) IMPLANT
SUT VIC AB 2-0 SH 27 (SUTURE)
SUT VIC AB 2-0 SH 27X BRD (SUTURE) IMPLANT
SUT VIC AB 3-0 SH 18 (SUTURE) ×2 IMPLANT
SYR 20CC LL (SYRINGE) IMPLANT
SYR BULB IRRIGATION 50ML (SYRINGE) ×2 IMPLANT
SYR CONTROL 10ML LL (SYRINGE) ×2 IMPLANT
TOWEL OR 17X24 6PK STRL BLUE (TOWEL DISPOSABLE) ×4 IMPLANT
TUBE CONNECTING 12X1/4 (SUCTIONS) IMPLANT
YANKAUER SUCT BULB TIP NO VENT (SUCTIONS) ×2 IMPLANT

## 2016-06-15 NOTE — Anesthesia Preprocedure Evaluation (Signed)
Anesthesia Evaluation  Patient identified by MRN, date of birth, ID band Patient awake    Reviewed: Allergy & Precautions, NPO status , Patient's Chart, lab work & pertinent test results  Airway Mallampati: II  TM Distance: >3 FB Neck ROM: Full    Dental no notable dental hx.    Pulmonary neg pulmonary ROS, former smoker,    Pulmonary exam normal breath sounds clear to auscultation       Cardiovascular hypertension, negative cardio ROS Normal cardiovascular exam Rhythm:Regular Rate:Normal     Neuro/Psych  Headaches, PSYCHIATRIC DISORDERS Depression Bipolar Disorder    GI/Hepatic Neg liver ROS, GERD  Medicated,  Endo/Other  negative endocrine ROS  Renal/GU Renal disease  negative genitourinary   Musculoskeletal  (+) Arthritis ,   Abdominal   Peds negative pediatric ROS (+)  Hematology negative hematology ROS (+)   Anesthesia Other Findings   Reproductive/Obstetrics negative OB ROS                             Anesthesia Physical Anesthesia Plan  ASA: III  Anesthesia Plan: MAC   Post-op Pain Management:    Induction: Intravenous  Airway Management Planned: Natural Airway  Additional Equipment:   Intra-op Plan:   Post-operative Plan:   Informed Consent: I have reviewed the patients History and Physical, chart, labs and discussed the procedure including the risks, benefits and alternatives for the proposed anesthesia with the patient or authorized representative who has indicated his/her understanding and acceptance.   Dental advisory given  Plan Discussed with: CRNA  Anesthesia Plan Comments:         Anesthesia Quick Evaluation

## 2016-06-15 NOTE — H&P (View-Only) (Signed)
History of Present Illness Abigail Ruff MD; 99991111 2:25 PM) The patient is a 65 year old female who presents with rectovaginal fistula. Patient presents to the office with complaints of stool per fragile enough for the past 3 months. She was seen by her gynecologist who is treating her ovarian cancer. A CT scan of the pelvis was ordered. This was done with rectal contrast. An obvious rectovaginal fistula was seen distally. An exam under anesthesia was performed which showed a mass. Biopsies confirmed squamous cell carcinoma. She was then treated for this and the mass has cleared. She continues to have some vaginal drainage. There is minimal change since last month.   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 Knee Surgery Left. Cesarean Section - 1 Hysterectomy (not due to cancer) - Partial  Diagnostic Studies History Abigail Ruff, MD; 99991111 2:18 PM) Pap Smear 1-5 years ago Colonoscopy 1-5 years ago Mammogram 1-3 years ago  Allergies Abigail Wagner, [place]; 05/24/2016 2:02 PM) No Known Drug Allergies 06/16/2014  Medication History (Abigail Wagner, CMA; 05/24/2016 2:03 PM) Potassium (75MG  Tablet, Oral) Active. ValACYclovir HCl (500MG  Tablet, Oral two times daily) Active. Lansoprazole (15MG  Capsule ER, Oral) Active. MetroNIDAZOLE (0.75% Gel, Vaginal) Active. ValACYclovir HCl (1GM Tablet, Oral) Active. Ativan (1MG  Tablet, Oral) Active. Voltaren (75MG  Tablet DR, Oral) Active. Altabax (1% Ointment, External)  Active. Vitamin C ER (500MG  Tablet ER, Oral) Active. Estrace (2MG  Tablet, Oral) Active. Folic Acid (0000000 Tablet, Oral) Active. Norco (5-325MG  Tablet, Oral) Active. Prevacid (15MG  Capsule DR, Oral) Active. B Complex (Oral) Specific dose unknown - Active. Bentyl (10MG  Capsule, Oral) Active. Depakote ER (250MG  Tablet ER 24HR, Oral) Active. Lexapro (20MG  Tablet, Oral) Active. Anusol-HC (2.5% Cream, Rectal) Active. RA Krill Oil (500MG  Capsule, Oral) Active. Metoprolol Tartrate (25MG  Tablet, Oral) Active. Multiple Vitamin (Oral) Active. Divalproex Sodium ER (500MG  Tablet ER 24HR, Oral) Active. Medications Reconciled  Social History Abigail Ruff, MD; 99991111 2:18 PM) Alcohol use Recently quit alcohol use. Caffeine use Carbonated beverages, Coffee. Illicit drug use Recently quit drug use. Tobacco use Current every day smoker.  Family History Abigail Ruff, MD; 99991111 2:18 PM) Migraine Headache Sister. Depression Daughter, Mother. Hypertension Father. Heart disease in female family member before age 33 Ischemic Bowel Disease Mother. Heart Disease Brother, Father, Sister.  Pregnancy / Birth History Abigail Ruff, MD; 99991111 2:18 PM) Age at menarche 66 years. Gravida 3 Contraceptive History Oral contraceptives. Para 3 Maternal age 19-20 Age of menopause 50-60    Vitals (Abigail Wagner; 05/24/2016 2:02 PM) 05/24/2016 2:02 PM Weight: 126 lb Height: 65in Body Surface Area: 1.63 m Body Mass Index: 20.97 kg/m  Temp.: 97.66F(Temporal)  Pulse: 79 (Regular)  BP: 128/80 (Sitting, Left Arm, Standard)      Physical Exam Abigail Ruff MD; 99991111 2:26 PM)  General Note: Pleasant. Looks good. No distress. Husband is with her.  Head and Neck Note: Neck reveals no adenopathy or mass. Good range of motion. Clavicles normal without deformity.  Chest and Lung Exam Note: Clear to auscultation  bilaterally  Cardiovascular Note: regular rate and rhythm. No murmur. No ectopy.  Abdomen Note: Soft. Not distended. Portin right upper quadrant without infection. Lower midline incision well healed.  Rectal Anorectal Exam External - normal external exam. Internal - Note: Small defect elevated anteriorly, distal anal canal.  Neurologic Neurologic evaluation reveals -alert and oriented x 3 with no impairment of recent or remote memory.    Assessment & Plan Abigail Ruff MD; 99991111 2:26 PM)  RECTOVAGINAL FISTULA (N82.3) Impression: 65 year old female who presents to the office with a history of ovarian cancer and rectovaginal fistula. She has noticed no change in her symptoms over the past month. We have decided to go ahead and proceed with surgical closure when mucosal advancement flap. She has agreed to stop smoking and to continue to stop smoking for 6 weeks after surgery at least, to help the area heal. We discussed that there is a high chance that this could recur if the area does not heal, but we will take all measures to help prevent this from happening.

## 2016-06-15 NOTE — Transfer of Care (Signed)
Immediate Anesthesia Transfer of Care Note  Patient: Abigail Wagner  Procedure(s) Performed: Procedure(s) (LRB): EXCISION RECTOVAGINAOL FISTULA WITH MUCOSAL ADVANCEMENT FLAP (N/A)  Patient Location: PACU  Anesthesia Type: MAC  Level of Consciousness: awake, alert , oriented and patient cooperative  Airway & Oxygen Therapy: Patient Spontanous Breathing and Patient connected to face mask oxygen  Post-op Assessment: Report given to PACU RN and Post -op Vital signs reviewed and stable  Post vital signs: Reviewed and stable  Complications: No apparent anesthesia complications

## 2016-06-15 NOTE — Interval H&P Note (Signed)
History and Physical Interval Note:  06/15/2016 10:15 AM  Abigail Wagner GABI REESER  has presented today for surgery, with the diagnosis of Rectovaginal fistula  The various methods of treatment have been discussed with the patient and family. After consideration of risks, benefits and other options for treatment, the patient has consented to  Procedure(s): MUCOSAL ADVANCEMENT FLAP (N/A) as a surgical intervention .  The patient's history has been reviewed, patient examined, no change in status, stable for surgery.  I have reviewed the patient's chart and labs.  Questions were answered to the patient's satisfaction.     Rosario Adie, MD  [facility]

## 2016-06-15 NOTE — Discharge Instructions (Addendum)
Post Anesthesia Home Care Instructions  Activity: Get plenty of rest for the remainder of the day. A responsible adult should stay with you for 24 hours following the procedure.  For the next 24 hours, DO NOT: -Drive a car -Operate machinery -Drink alcoholic beverages -Take any medication unless instructed by your physician -Make any legal decisions or sign important papers.  Meals: Start with liquid foods such as gelatin or soup. Progress to regular foods as tolerated. Avoid greasy, spicy, heavy foods. If nausea and/or vomiting occur, drink only clear liquids until the nausea and/or vomiting subsides. Call your physician if vomiting continues.  Special Instructions/Symptoms: Your throat may feel dry or sore from the anesthesia or the breathing tube placed in your throat during surgery. If this causes discomfort, gargle with warm salt water. The discomfort should disappear within 24 hours.  If you had a scopolamine patch placed behind your ear for the management of post- operative nausea and/or vomiting:  1. The medication in the patch is effective for 72 hours, after which it should be removed.  Wrap patch in a tissue and discard in the trash. Wash hands thoroughly with soap and water. 2. You may remove the patch earlier than 72 hours if you experience unpleasant side effects which may include dry mouth, dizziness or visual disturbances. 3. Avoid touching the patch. Wash your hands with soap and water after contact with the patch.   ANORECTAL SURGERY: POST OP INSTRUCTIONS 1. Take your usually prescribed home medications unless otherwise directed. 2. DIET: During the first few hours after surgery sip on some liquids until you are able to urinate.  It is normal to not urinate for several hours after this surgery.  If you feel uncomfortable, please contact the office for instructions.  After you are able to urinate,you may eat, if you feel like it.  Follow a light bland diet the first 24  hours after arrival home, such as soup, liquids, crackers, etc.  Be sure to include lots of fluids daily (6-8 glasses).  Avoid fast food or heavy meals, as your are more likely to get nauseated.  Eat a low fat diet the next few days after surgery.  Limit caffeine intake to 1-2 servings a day. 3. PAIN CONTROL: a. Pain is best controlled by a usual combination of several different methods TOGETHER: i. Muscle relaxation: Soak in a warm bath (or Sitz bath) three times a day and after bowel movements.  Continue to do this until all pain is resolved. ii. Over the counter pain medication iii. Prescription pain medication b. Most patients will experience some swelling and discomfort in the anus/rectal area and incisions.  Heat such as warm towels, sitz baths, warm baths, etc to help relax tight/sore spots and speed recovery.  Some people prefer to use ice, especially in the first couple days after surgery, as it may decrease the pain and swelling, or alternate between ice & heat.  Experiment to what works for you.  Swelling and bruising can take several weeks to resolve.  Pain can take even longer to completely resolve. c. It is helpful to take an over-the-counter pain medication regularly for the first few weeks.  Choose one of the following that works best for you: i. Naproxen (Aleve, etc)  Two 220mg tabs twice a day ii. Ibuprofen (Advil, etc) Three 200mg tabs four times a day (every meal & bedtime) d. A  prescription for pain medication (such as percocet, oxycodone, hydrocodone, etc) should be given to you upon   upon discharge.  Take your pain medication as prescribed.  i. If you are having problems/concerns with the prescription medicine (does not control pain, nausea, vomiting, rash, itching, etc), please call us [phone] to see if we need to switch you to a different pain medicine that will work better for you and/or control your side effect better. ii. If you need a refill on your pain medication, please  contact your pharmacy.  They will contact our office to request authorization. Prescriptions will not be filled after 5 pm or on week-ends. 4. KEEP YOUR BOWELS REGULAR and AVOID CONSTIPATION a. The goal is one to two soft bowel movements a day.  You should at least have a bowel movement every other day. b. Avoid getting constipated.  Between the surgery and the pain medications, it is common to experience some constipation. This can be very painful after rectal surgery.  Increasing fluid intake and taking a fiber supplement (such as Metamucil, Citrucel, FiberCon, etc) 1-2 times a day regularly will usually help prevent this problem from occurring.  A stool softener like colace is also recommended.  This can be purchased over the counter at your pharmacy.  You can take it up to 3 times a day.  If you do not have a bowel movement after 24 hrs since your surgery, take one does of milk of magnesia.  If you still haven't had a bowel movement 8-12 hours after that dose, take another dose.  If you don't have a bowel movement 48 hrs after surgery, purchase a Fleets enema from the drug store and administer gently per package instructions.  If you still are having trouble with your bowel movements after that, please call the office for further instructions. c. If you develop diarrhea or have many loose bowel movements, simplify your diet to bland foods & liquids for a few days.  Stop any stool softeners and decrease your fiber supplement.  Switching to mild anti-diarrheal medications (Kayopectate, Pepto Bismol) can help.  If this worsens or does not improve, please call us.  5. Wound Care a. Remove your bandages before your first bowel movement or 8 hours after surgery.     b. Remove any wound packing material at this tim,e as well.  You do not need to repack the wound unless instructed otherwise.  Wear an absorbent pad or soft cotton gauze in your underwear to catch any drainage and help keep the area clean. You  should change this every 2-3 hours while awake. c. Keep the area clean and dry.  Bathe / shower every day, especially after bowel movements.  Keep the area clean by showering / bathing over the incision / wound.   It is okay to soak an open wound to help wash it.  Wet wipes or showers / gentle washing after bowel movements is often less traumatic than regular toilet paper. d. You may have some styrofoam-like soft packing in the rectum which will come out with the first bowel movement.  e. You will often notice bleeding with bowel movements.  This should slow down by the end of the first week of surgery f. Expect some drainage.  This should slow down, too, by the end of the first week of surgery.  Wear an absorbent pad or soft cotton gauze in your underwear until the drainage stops. g. Do Not sit on a rubber or pillow ring.  This can make you symptoms worse.  You may sit on a soft pillow if needed.    6. ACTIVITIES as tolerated:   a. You may resume regular (light) daily activities beginning the next day--such as daily self-care, walking, climbing stairs--gradually increasing activities as tolerated.  If you can walk 30 minutes without difficulty, it is safe to try more intense activity such as jogging, treadmill, bicycling, low-impact aerobics, swimming, etc. b. Save the most intensive and strenuous activity for last such as sit-ups, heavy lifting, contact sports, etc  Refrain from any heavy lifting or straining until you are off narcotics for pain control.   c. You may drive when you are no longer taking prescription pain medication, you can comfortably sit for long periods of time, and you can safely maneuver your car and apply brakes. d. You may have sexual intercourse when it is comfortable.  7. FOLLOW UP in our office a. Please call [facility] at [phone] to set up an appointment to see your surgeon in the office for a follow-up appointment approximately 3-4 weeks after your surgery. b. Make sure that  you call for this appointment the day you arrive home to insure a convenient appointment time. 10. IF YOU HAVE DISABILITY OR FAMILY LEAVE FORMS, BRING THEM TO THE OFFICE FOR PROCESSING.  DO NOT GIVE THEM TO YOUR DOCTOR.     WHEN TO CALL US [phone]: 1. Poor pain control 2. Reactions / problems with new medications (rash/itching, nausea, etc)  3. Fever over 101.5 F (38.5 C) 4. Inability to urinate 5. Nausea and/or vomiting 6. Worsening swelling or bruising 7. Continued bleeding from incision. 8. Increased pain, redness, or drainage from the incision  The clinic staff is available to answer your questions during regular business hours (8:30am-5pm).  Please don't hesitate to call and ask to speak to one of our nurses for clinical concerns.   A surgeon from [facility] is always on call at the hospitals   If you have a medical emergency, go to the nearest emergency room or call 911.    [facility] [address], [address], [place], [place]  [address] ? MAIN: [phone] ? TOLL FREE: [phone] ? FAX [phone] [email or web address]    

## 2016-06-15 NOTE — Anesthesia Postprocedure Evaluation (Signed)
Anesthesia Post Note  Patient: Abigail Wagner  Procedure(s) Performed: Procedure(s) (LRB): EXCISION RECTOVAGINAOL FISTULA WITH MUCOSAL ADVANCEMENT FLAP (N/A)  Patient location during evaluation: PACU Anesthesia Type: MAC Level of consciousness: awake and alert Pain management: pain level controlled Vital Signs Assessment: post-procedure vital signs reviewed and stable Respiratory status: spontaneous breathing, nonlabored ventilation, respiratory function stable and patient connected to nasal cannula oxygen Cardiovascular status: stable and blood pressure returned to baseline Anesthetic complications: no    Last Vitals:  Vitals:   06/15/16 1200 06/15/16 1251  BP: (!) 105/56 (!) 108/52  Pulse: 65 61  Resp: 15 14  Temp:  36.6 C    Last Pain:  Vitals:   06/15/16 1251  TempSrc:   PainSc: 0-No pain                 Wiletta Bermingham J

## 2016-06-15 NOTE — Op Note (Signed)
06/15/2016  11:20 AM  PATIENT:  Abigail Wagner  65 y.o. female  Patient Care Team: Caren Macadam, MD as PCP - General (Family Medicine)  PRE-OPERATIVE DIAGNOSIS:  Rectovaginal fistula  POST-OPERATIVE DIAGNOSIS:  Rectovaginal fistula  PROCEDURE:  EXCISION RECTOVAGINAOL FISTULA WITH MUCOSAL ADVANCEMENT FLAP   Surgeon(s): Leighton Ruff, MD  ASSISTANT: none   ANESTHESIA:   local and MAC  SPECIMEN:  Source of Specimen:  rectovaginal fistula  DISPOSITION OF SPECIMEN:  PATHOLOGY  COUNTS:  YES  PLAN OF CARE: Discharge to home after PACU  PATIENT DISPOSITION:  PACU - hemodynamically stable.  INDICATION: 64 y.o. F s/p chemoRT for anal cancer within the rectovaginal septum.  She appears to have had a complete response from her therapy but has a persistent fistula in her distal rectovaginal septum.   OR FINDINGS: Distal rectovaginal fistula with no signs of recurrent anal cancer  DESCRIPTION: the patient was identified in the preoperative holding area and taken to the OR where they were laid on the operating room table.  MAC anesthesia was induced without difficulty. The patient was then positioned in prone jackknife position with buttocks gently taped apart.  The patient was then prepped and draped in usual sterile fashion.  SCDs were noted to be in place prior to the initiation of anesthesia. A surgical timeout was performed indicating the correct patient, procedure, positioning and need for preoperative antibiotics.  A rectal block was performed using Marcaine with epinephrine.    I began with a digital rectal exam.  The fistula could be palpated at posterior midline.  I then placed a Hill-Ferguson anoscope into the anal canal and evaluated this completely.  I identified it the fistula and placed a right angle retractor through to the vagina. This exited into the distal vagina. There was no surrounding vaginal inflammation. I made an incision with electrocautery above the fistula  tract and divided this down to the rectovaginal septum. I used Metzenbaum scissors to separate the vagina from the distal rectum. I mobilized a flap of tissue up above the fistula site. Once the flap was significantly mobilized, I closed the fistula site with a 3-0 silk suture. I then used several 3-0 silk sutures to tack the anal rectal wall to the vaginal wall and closed the dissected space. I then used 3-0 chromic sutures to secure the edges of the mucosal advancement flap to the anoderm. There was minimal bleeding noted throughout dissection of the rectovaginal septum. There was good bleeding noted when I transected the fistula away from the remaining mucosa at the mucosal edge. The fistula tract was sent to pathology for further examination. All counts were correct per operating room staff. Hemostasis was good. The patient was sent to the postanesthesia care unit in stable condition.

## 2016-06-16 ENCOUNTER — Encounter (HOSPITAL_BASED_OUTPATIENT_CLINIC_OR_DEPARTMENT_OTHER): Payer: Self-pay | Admitting: General Surgery

## 2016-06-30 ENCOUNTER — Encounter (HOSPITAL_COMMUNITY): Payer: Self-pay | Admitting: Psychiatry

## 2016-06-30 ENCOUNTER — Ambulatory Visit (INDEPENDENT_AMBULATORY_CARE_PROVIDER_SITE_OTHER): Payer: Medicare Other | Admitting: Psychiatry

## 2016-06-30 VITALS — BP 122/70 | HR 86 | Ht 66.0 in | Wt 128.0 lb

## 2016-06-30 DIAGNOSIS — F331 Major depressive disorder, recurrent, moderate: Secondary | ICD-10-CM | POA: Diagnosis not present

## 2016-06-30 DIAGNOSIS — F39 Unspecified mood [affective] disorder: Secondary | ICD-10-CM | POA: Diagnosis not present

## 2016-06-30 MED ORDER — DIVALPROEX SODIUM 250 MG PO DR TAB: 250.0000 mg | DELAYED_RELEASE_TABLET | Freq: Every evening | ORAL | 1 refills | Status: DC

## 2016-06-30 MED ORDER — ESCITALOPRAM OXALATE 20 MG PO TABS: 20.0000 mg | ORAL_TABLET | Freq: Every morning | ORAL | 1 refills | Status: DC

## 2016-06-30 MED ORDER — LORAZEPAM 1 MG PO TABS: 1.0000 mg | ORAL_TABLET | Freq: Two times a day (BID) | ORAL | 3 refills | Status: DC
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
## 2016-07-26 ENCOUNTER — Encounter (HOSPITAL_COMMUNITY): Payer: Self-pay

## 2016-07-26 ENCOUNTER — Encounter (HOSPITAL_COMMUNITY): Payer: Medicare Other | Attending: Hematology & Oncology

## 2016-07-26 DIAGNOSIS — Z452 Encounter for adjustment and management of vascular access device: Secondary | ICD-10-CM | POA: Diagnosis not present

## 2016-07-26 DIAGNOSIS — C210 Malignant neoplasm of anus, unspecified: Secondary | ICD-10-CM

## 2016-07-26 DIAGNOSIS — C569 Malignant neoplasm of unspecified ovary: Secondary | ICD-10-CM | POA: Insufficient documentation

## 2016-07-26 MED ORDER — HEPARIN SOD (PORK) LOCK FLUSH 100 UNIT/ML IV SOLN
500.0000 [IU] | Freq: Once | INTRAVENOUS | Status: AC
  Administered 2016-07-26: 500 [IU] via INTRAVENOUS
  Filled 2016-07-26: qty 5

## 2016-07-26 MED ORDER — SODIUM CHLORIDE 0.9% FLUSH
10.0000 mL | INTRAVENOUS | Status: DC | PRN
  Administered 2016-07-26: 10 mL via INTRAVENOUS
  Filled 2016-07-26: qty 10

## 2016-07-26 NOTE — Progress Notes (Signed)
Dawnetta M Zeek presented for Portacath access and flush.  Portacath located right chest wall accessed with  H 20 needle.  Good blood return present. Portacath flushed with 20ml NS and 500U/5ml Heparin and needle removed intact.  Procedure tolerated well and without incident.   

## 2016-07-26 NOTE — Patient Instructions (Signed)
[facility] at [facility] Discharge Instructions  RECOMMENDATIONS MADE BY THE CONSULTANT AND ANY TEST RESULTS WILL BE SENT TO YOUR REFERRING PHYSICIAN.  Port flush today. Return as scheduled for port flushes. Return as scheduled for office visit.   Thank you for choosing [facility] at [facility] to provide your oncology and hematology care.  To afford each patient quality time with our provider, please arrive at least 15 minutes before your scheduled appointment time.   Beginning January 23rd 2017 lab work for the Cancer Center will be done in the  Main lab at [facility] on 1st floor. If you have a lab appointment with the Cancer Center please come in thru the  Main Entrance and check in at the main information desk  You need to re-schedule your appointment should you arrive 10 or more minutes late.  We strive to give you quality time with our providers, and arriving late affects you and other patients whose appointments are after yours.  Also, if you no show three or more times for appointments you may be dismissed from the clinic at the providers discretion.     Again, thank you for choosing [facility].  Our hope is that these requests will decrease the amount of time that you wait before being seen by our physicians.       _____________________________________________________________  Should you have questions after your visit to [facility], please contact our office at [phone] between the hours of 8:30 a.m. and 4:30 p.m.  Voicemails left after 4:30 p.m. will not be returned until the following business day.  For prescription refill requests, have your pharmacy contact our office.         Resources For Cancer Patients and their Caregivers ? American Cancer Society: Can assist with transportation, wigs, general needs, runs Look Good Feel Better.        1-888-227-6333 ? Cancer Care: Provides financial  assistance, online support groups, medication/co-pay assistance.  1-800-813-HOPE (4673) ? [facility] Assists [place] cancer patients and their families through emotional , educational and financial support.  336-427-4357 ? [place] DSS Where to apply for food stamps, Medicaid and utility assistance. 336-342-1394 ? [facility]: Transportation to medical appointments. 336-347-2287 ? Social Security Administration: May apply for disability if have a Stage IV cancer. 336-342-7796 1-800-772-1213 ? [place] Aging, Disability and Transit Services: Assists with nutrition, care and transit needs. 336-349-2343  Cancer Center Support Programs: @[email or web address]@ > Cancer Support Group  2nd Tuesday of the month 1pm-2pm, Journey Room  > Creative Journey  3rd Tuesday of the month 1130am-1pm, Journey Room  > Look Good Feel Better  1st Wednesday of the month 10am-12 noon, Journey Room (Call American Cancer Society to register 1-800-395-5775)   

## 2016-09-11 ENCOUNTER — Ambulatory Visit (HOSPITAL_COMMUNITY)
Admission: RE | Admit: 2016-09-11 | Discharge: 2016-09-11 | Disposition: A | Discharge status: Home / Self Care | Payer: Medicare Other | Source: Ambulatory Visit | Attending: Hematology & Oncology | Admitting: Hematology & Oncology

## 2016-09-11 DIAGNOSIS — C210 Malignant neoplasm of anus, unspecified: Secondary | ICD-10-CM

## 2016-09-11 MED ORDER — BARIUM SULFATE 2.1 % PO SUSP
ORAL | Status: AC
  Filled 2016-09-11: qty 2

## 2016-09-13 ENCOUNTER — Ambulatory Visit (HOSPITAL_COMMUNITY): Payer: Self-pay | Admitting: Oncology

## 2016-09-13 ENCOUNTER — Encounter (HOSPITAL_COMMUNITY): Payer: Self-pay

## 2016-09-20 ENCOUNTER — Encounter (HOSPITAL_COMMUNITY): Payer: Self-pay

## 2016-09-20 ENCOUNTER — Ambulatory Visit (HOSPITAL_COMMUNITY)
Admission: RE | Admit: 2016-09-20 | Discharge: 2016-09-20 | Disposition: A | Discharge status: Home / Self Care | Payer: Medicare Other | Source: Ambulatory Visit | Attending: Hematology & Oncology | Admitting: Hematology & Oncology

## 2016-09-20 DIAGNOSIS — I700 Atherosclerosis of aorta: Secondary | ICD-10-CM | POA: Insufficient documentation

## 2016-09-20 DIAGNOSIS — I2510 Atherosclerotic heart disease of native coronary artery without angina pectoris: Secondary | ICD-10-CM | POA: Diagnosis not present

## 2016-09-20 DIAGNOSIS — C210 Malignant neoplasm of anus, unspecified: Secondary | ICD-10-CM | POA: Insufficient documentation

## 2016-09-20 LAB — POCT I-STAT CREATININE: Creatinine, Ser: 0.6 mg/dL (ref 0.44–1.00)

## 2016-09-20 MED ORDER — IOPAMIDOL (ISOVUE-300) INJECTION 61%
80.0000 mL | Freq: Once | INTRAVENOUS | Status: AC | PRN
  Administered 2016-09-20: 80 mL via INTRAVENOUS

## 2016-09-27 ENCOUNTER — Encounter (HOSPITAL_BASED_OUTPATIENT_CLINIC_OR_DEPARTMENT_OTHER): Payer: Medicare Other

## 2016-09-27 ENCOUNTER — Encounter (HOSPITAL_COMMUNITY): Payer: Medicare Other | Attending: Oncology | Admitting: Oncology

## 2016-09-27 ENCOUNTER — Encounter (HOSPITAL_COMMUNITY): Payer: Self-pay | Admitting: Oncology

## 2016-09-27 VITALS — BP 129/58 | HR 92 | Temp 97.6°F | Resp 16 | Wt 137.4 lb

## 2016-09-27 DIAGNOSIS — I10 Essential (primary) hypertension: Secondary | ICD-10-CM

## 2016-09-27 DIAGNOSIS — C210 Malignant neoplasm of anus, unspecified: Secondary | ICD-10-CM

## 2016-09-27 DIAGNOSIS — K219 Gastro-esophageal reflux disease without esophagitis: Secondary | ICD-10-CM | POA: Insufficient documentation

## 2016-09-27 DIAGNOSIS — F419 Anxiety disorder, unspecified: Secondary | ICD-10-CM | POA: Insufficient documentation

## 2016-09-27 DIAGNOSIS — N760 Acute vaginitis: Secondary | ICD-10-CM | POA: Insufficient documentation

## 2016-09-27 DIAGNOSIS — Z8543 Personal history of malignant neoplasm of ovary: Secondary | ICD-10-CM

## 2016-09-27 DIAGNOSIS — C562 Malignant neoplasm of left ovary: Secondary | ICD-10-CM

## 2016-09-27 DIAGNOSIS — Z85048 Personal history of other malignant neoplasm of rectum, rectosigmoid junction, and anus: Secondary | ICD-10-CM | POA: Diagnosis not present

## 2016-09-27 DIAGNOSIS — T7840XA Allergy, unspecified, initial encounter: Secondary | ICD-10-CM | POA: Insufficient documentation

## 2016-09-27 DIAGNOSIS — M549 Dorsalgia, unspecified: Secondary | ICD-10-CM | POA: Insufficient documentation

## 2016-09-27 DIAGNOSIS — C569 Malignant neoplasm of unspecified ovary: Secondary | ICD-10-CM | POA: Insufficient documentation

## 2016-09-27 HISTORY — DX: Essential (primary) hypertension: I10

## 2016-09-27 LAB — COMPREHENSIVE METABOLIC PANEL
ALT: 24 U/L (ref 14–54)
AST: 25 U/L (ref 15–41)
Albumin: 3.5 g/dL (ref 3.5–5.0)
Alkaline Phosphatase: 54 U/L (ref 38–126)
Anion gap: 5 (ref 5–15)
BILIRUBIN TOTAL: 0.3 mg/dL (ref 0.3–1.2)
BUN: 19 mg/dL (ref 6–20)
CALCIUM: 8.9 mg/dL (ref 8.9–10.3)
CHLORIDE: 104 mmol/L (ref 101–111)
CO2: 27 mmol/L (ref 22–32)
CREATININE: 0.54 mg/dL (ref 0.44–1.00)
Glucose, Bld: 106 mg/dL — ABNORMAL HIGH (ref 65–99)
Potassium: 3.8 mmol/L (ref 3.5–5.1)
Sodium: 136 mmol/L (ref 135–145)
TOTAL PROTEIN: 6.1 g/dL — AB (ref 6.5–8.1)

## 2016-09-27 LAB — CBC WITH DIFFERENTIAL/PLATELET
BASOS ABS: 0 10*3/uL (ref 0.0–0.1)
BASOS PCT: 1 %
EOS ABS: 0.4 10*3/uL (ref 0.0–0.7)
EOS PCT: 8 %
HCT: 33.4 % — ABNORMAL LOW (ref 36.0–46.0)
HEMOGLOBIN: 11.1 g/dL — AB (ref 12.0–15.0)
Lymphocytes Relative: 22 %
Lymphs Abs: 1.1 10*3/uL (ref 0.7–4.0)
MCH: 31.3 pg (ref 26.0–34.0)
MCHC: 33.2 g/dL (ref 30.0–36.0)
MCV: 94.1 fL (ref 78.0–100.0)
Monocytes Absolute: 0.3 10*3/uL (ref 0.1–1.0)
Monocytes Relative: 7 %
NEUTROS PCT: 62 %
Neutro Abs: 3 10*3/uL (ref 1.7–7.7)
PLATELETS: 247 10*3/uL (ref 150–400)
RBC: 3.55 MIL/uL — AB (ref 3.87–5.11)
RDW: 14.7 % (ref 11.5–15.5)
WBC: 4.9 10*3/uL (ref 4.0–10.5)

## 2016-09-27 MED ORDER — SODIUM CHLORIDE 0.9% FLUSH
20.0000 mL | INTRAVENOUS | Status: DC | PRN
  Administered 2016-09-27: 20 mL via INTRAVENOUS
  Filled 2016-09-27: qty 20

## 2016-09-27 MED ORDER — HEPARIN SOD (PORK) LOCK FLUSH 100 UNIT/ML IV SOLN
500.0000 [IU] | Freq: Once | INTRAVENOUS | Status: AC
  Administered 2016-09-27: 500 [IU] via INTRAVENOUS
  Filled 2016-09-27: qty 5

## 2016-09-27 NOTE — Assessment & Plan Note (Addendum)
Stage II anal cancer, S/P concomitant chemoradiation consisting of 5FU and Mitomycin C with curative intent, finishing therapy on 12/06/2015.  Oncology history is developed.  Staging in [facility] problem list is completed.  Labs today: CBC diff, CMET.  I personally reviewed and went over laboratory results with the patient.  The results are noted within this dictation.  I personally reviewed and went over radiographic studies with the patient.  The results are noted within this dictation.  CT CAP is negative for any recurrent disease.  She is up to date on mammogram.  She is due in Sept 2018.  I personally reviewed and went over radiographic studies with the patient.  The results are noted within this dictation.    I reviewed the NCCN guidelines pertaining to surveillance.  She has follow-up with Dr. Leighton Ruff in Feb [id number].  Labs in 3 months: CBC diff, CMET.  Return in 3 months for labs and follow-up appointment.

## 2016-09-27 NOTE — Progress Notes (Signed)
Abigail Wagner presented for Portacath access and flush. Proper placement of portacath confirmed by CXR. Portacath located right chest wall accessed with  H 20 needle. Good blood return present.  Specimen drawn for labs.   Portacath flushed with 32ml NS and 500U/36ml Heparin and needle removed intact. Procedure without incident. Patient tolerated procedure well.

## 2016-09-27 NOTE — Assessment & Plan Note (Signed)
Stage III ovarian cancer, S/P debulking surgery on 07/03/2014 by Dr. Alycia Rossetti, followed by 6 cycles of Carboplatin/Paclitaxel with last treatment on 12/07/2014.  Oncology history developed.  Labs today: CBC diff, CMET, CA 125.  I personally reviewed and went over laboratory results with the patient.  The results are noted within this dictation.  I personally reviewed and went over radiographic studies with the patient.  The results are noted within this dictation.  CT CAP was negative for any recurrence of disease.  Labs in 3 months: CBC diff, CMET, CA 125.  Return in 3 months for follow-up.

## 2016-09-27 NOTE — Progress Notes (Signed)
Abigail Wagner., MD [place] [id number]  Anal cancer [facility]) - Plan: CBC with Differential, Comprehensive metabolic panel  Ovarian cancer on left ([place]) - Plan: CBC with Differential, Comprehensive metabolic panel, CA 0000000  History of ovarian cancer  CURRENT THERAPY: Surveillance per NCCN guidelines  INTERVAL HISTORY: Abigail Wagner 66 y.o. female returns for followup of Stage II anal cancer, S/P concomitant chemoradiation consisting of 5FU and Mitomycin C with curative intent, finishing therapy on 12/06/2015 AND Stage III ovarian cancer, S/P debulking surgery on 07/03/2014 by Dr. Alycia Wagner, followed by 6 cycles of Carboplatin/Paclitaxel with last treatment on 12/07/2014.    History of ovarian cancer   06/02/2014 Procedure    Diagnostic laparoscopy, lysis of adhesions, biopsy of omental nodules, biopsy right ovary by Dr Abigail Wagner      06/04/2014 Pathology Results    Diagnosis 1. Omentum, biopsy, Anterior peritoneal & omental nodule - SEROUS CARCINOMA WITH ASSOCIATED ABUNDANT PSAMMOMA BODIES AND DESMOPLASTIC STROMAL REACTION CONSISTENT WITH INVASIVE IMPLANTS. - SEE COMMENT. 2. Omentum, biopsy, Omental nodule - SEROUS CARCINOMA WITH ASSOCIATED ABUNDANT PSAMMOMA BODIES AND DESMOPLASTIC STROMAL REACTION CONSISTENT WITH INVASIVE IMPLANTS. - SEE COMMENT. 3. Ovary, biopsy/wedge resection, Right ovary - SMALL FRAGMENTS OF ATYPICAL PAPILLARY PROLIFERATION WITH PSAMMOMA BODIES CONSISTENT WITH AT LEAST SEROUS BORDERLINE TUMOR.      07/03/2014 Procedure    Exploratory laparotomy, omentectomy, Bilateral salpingo-oophorectomy, inptraperitoneal PORT placement by Dr. Alycia Wagner          07/24/2014 - 12/07/2014 Chemotherapy    Carboplatin/Paclitaxel x 6 cycles       08/17/2014 Procedure    Insertion of 8 French power port clear view tunneled venous vascular access device next line use of fluoroscopy for guidance and positioning Use of ultrasound for  venipuncture Removal of intraperitoneal chemotherapy port By Dr. Dion [facility])   09/09/2015 Procedure    Rectovaginal septal mass biopsy by Dr. Marcello Wagner      09/10/2015 Pathology Results    Soft tissue mass, biopsy, rectovaginal septal mass - SQUAMOUS CELL CARCINOMA.      10/15/2015 PET scan    1. Focal hypermetabolism with associated ill-defined soft tissue fullness at the junction of the anterior anal wall and posterior inferior vaginal wall, in keeping with the provided history of a primary anal carcinoma. 2. No hypermetabolic locoregional or distant metastatic disease. 3. Status post hysterectomy, with no abnormal findings at the vaginal cuff. No evidence of hypermetabolic peritoneal tumor. 4. Tiny nonobstructing bilateral renal stones.      10/25/2015 - 12/06/2015 Radiation Therapy    Abigail, Wagner      10/25/2015 - 12/06/2015 Chemotherapy    Mitomycin C and 5FU, by Dr. Jacquiline Wagner       06/15/2016 Procedure    EXCISION RECTOVAGINAOL FISTULA WITH MUCOSAL ADVANCEMENT FLAP by Dr. Marcello Wagner      06/18/2016 Pathology Results    Fistula, rectovaginal SQUAMOUS, COLUMNAR JUNCTION MUCOSA AND SUBCUTANOUS SOFT TISSUE WITH FISTULA NO EVIDENCE OF MALIGNANCY      09/20/2016 Imaging    CT CAP- 1. No acute findings and no evidence for recurrent tumor or metastatic disease. 2. Aortic atherosclerosis and coronary artery calcification       She is here today to review recent CT imaging.    She denies any new issues or complaints.  She continues to follow with Dr. Leighton Wagner with upcoming appt in Feb 2018.  She denies any new anorectal pain, change in bowel  habits, etc.  She denies any new abdominal pain or bloating.    She reports two new moles on her back that she wants me to look at.  They appear to be SKs.  Review of Systems  Constitutional: Negative.  Negative for chills, fever and weight loss.  HENT: Negative.   Eyes: Negative.   Respiratory: Negative.   Negative for cough.   Cardiovascular: Negative.  Negative for chest pain.  Gastrointestinal: Negative.  Negative for blood in stool, constipation, diarrhea, melena, nausea and vomiting.  Genitourinary: Negative.   Musculoskeletal: Negative.   Skin: Negative.   Neurological: Negative.  Negative for weakness.  Psychiatric/Behavioral: Negative.     Past Medical History:  Diagnosis Date  . Anal carcinoma [facility]) oncologist-  dr Whitney [facility] ([facility])   dx 12/ 2016 [facility] --  completed therapy  03/ 2017  . Carcinoma of ovary, stage 3 [facility]) oncologist-  dr Wagner/  dr [place]ail Wagner (cancer center [place] [facility])--  no recurrency   dx 09/ 2015  Stage IIIB  papillary ovarian carcinoma  s/p  omentectomy and BSO &  chemotherapy (completed 12-07-2014)  . DDD (degenerative disc disease), lumbosacral   . GERD (gastroesophageal reflux disease)   . Headache(784.0)   . History of acute respiratory failure    05-30-2009  drug overdose-- (intubated for 2 days)  and aspiration pneumonia  . History of adenomatous polyp of colon 10/07/2015   tubular adenoma high grade dysplasia  . History of herpes genitalis   . History of kidney stones   . History of ovarian cancer 08/03/2015  . History of suicide attempt    per documentation in epic  05-30-2009  overdose benaodiazepine  . HTN (hypertension)    takes Metoprolol daily  . IBS (irritable bowel syndrome)    takes Bentyl daily  . Major depression   . Mood disorder ([place])   . OA (osteoarthritis)   . Prolapse of vaginal vault after hysterectomy 07/20/2015  . Rectovaginal fistula 08/05/2015  . Sigmoid diverticulosis   . Smokers' cough [facility])     Past Surgical History:  Procedure Laterality Date  . BREAST ENHANCEMENT SURGERY  1986  . [place]  . COLONOSCOPY N/A 10/07/2015   Procedure: COLONOSCOPY;  Surgeon: Abigail Houston, MD;  [facility];  Service: Endoscopy;  Laterality: N/A;  7:30  . EXPLORATORY LAPAROTOMY/ OMENTECTOMY/   BILATERAL SALPINGOOPHORECTOMY/  [facility] PLACEMENT  07-03-2014   [facility]  . KNEE ARTHROSCOPY Left 09-22-2004  . LAPAROSCOPY N/A 06/02/2014   Procedure: DIAGNOSTIC LAPAROSCOPY, OMENTAL BIOPSY, RIGHT OVARY BIOPSY, LYSIS OF ADHESIONS;  Surgeon: Fanny Skates, MD;  Location: [place];  Service: General;  Laterality: N/A;  . MUCOSAL ADVANCEMENT FLAP N/A 06/15/2016   Procedure: EXCISION RECTOVAGINAOL FISTULA WITH MUCOSAL ADVANCEMENT FLAP;  Surgeon: Abigail Ruff, MD;  Location: [place];  Service: General;  Laterality: N/A;  . PLACEMENT OF SETON N/A 09/09/2015   Procedure: PLACEMENT OF SETON;  Surgeon: Abigail Ruff, MD;  Location: [place];  Service: General;  Laterality: N/A;  . PORT-A-CATH REMOVAL Right 08/17/2014   Procedure: REMOVAL INTRAPERITONEAL CHEMO PORT;  Surgeon: Fanny Skates, MD;  Location: [place];  Service: General;  Laterality: Right;  . PORTACATH PLACEMENT Right 08/17/2014   Procedure:  PLACE NEW PORT A CATH;  Surgeon: Fanny Skates, MD;  Location: [place];  Service: General;  Laterality: Right;  . RECTAL BIOPSY N/A 09/09/2015   Procedure: BIOPSY OF RECTOVAGINAL MASS;  Surgeon: Abigail Ruff, MD;  Location: [facility];  Service: General;  Laterality: N/A;  . TUBAL LIGATION  YRS AGO  . VAGINAL HYSTERECTOMY  1981    Family History  Problem Relation Age of Onset  . Bipolar disorder Mother   . Anxiety disorder Mother   . Dementia Mother   . Alcohol abuse Paternal Uncle   . Bipolar disorder Maternal Grandmother   . Dementia Maternal Grandmother   . Alcohol abuse Paternal Uncle   . Stroke Brother   . Deep vein thrombosis Son   . ADD / ADHD Neg Hx   . Drug abuse Neg Hx   . Depression Neg Hx   . OCD Neg Hx   . Paranoid behavior Neg Hx   . Schizophrenia Neg Hx   . Seizures Neg Hx   . Sexual abuse Neg Hx   . Physical abuse Neg Hx     Social History   Social History  . Marital  status: Married    Spouse name: N/A  . Number of children: N/A  . Years of education: N/A   Social History Main Topics  . Smoking status: Former Smoker    Packs/day: 1.00    Years: 43.00    Types: Cigarettes    Quit date: 05/24/2016  . Smokeless tobacco: Never Used  . Alcohol use No  . Drug use: No  . Sexual activity: Not Asked   Other Topics Concern  . None   Social History Narrative  . None     PHYSICAL EXAMINATION  ECOG PERFORMANCE STATUS: 0 - Asymptomatic  Vitals:   09/27/16 1346  BP: (!) 129/58  Pulse: 92  Resp: 16  Temp: 97.6 F (36.4 C)    GENERAL:alert, no distress, well nourished, well developed, comfortable, cooperative, smiling and unaccompanied SKIN: skin color, texture, turgor are normal, positive for: two 0.5-1 cm SKs (nonerythematous, hyperpigmented lesions, raised, and   on back, one left of midline, the other right of midline HEAD: Normocephalic, No masses, lesions, tenderness or abnormalities EYES: normal, EOMI, Conjunctiva are pink and non-injected EARS: External ears normal OROPHARYNX:lips, buccal mucosa, and tongue normal and mucous membranes are moist  NECK: supple, trachea midline LYMPH:  no palpable lymphadenopathy BREAST:not examined LUNGS: clear to auscultation and percussion HEART: regular rate & rhythm, no murmurs and no gallops ABDOMEN:abdomen soft, non-tender and normal bowel sounds BACK: Back symmetric, no curvature. EXTREMITIES:less then 2 second capillary refill, no joint deformities, effusion, or inflammation, no skin discoloration, no cyanosis  NEURO: alert & oriented x 3 with fluent speech, no focal motor/sensory deficits, gait normal   LABORATORY DATA: CBC    Component Value Date/Time   WBC 4.9 09/27/2016 1347   RBC 3.55 (L) 09/27/2016 1347   HGB 11.1 (L) 09/27/2016 1347   HCT 33.4 (L) 09/27/2016 1347   PLT 247 09/27/2016 1347   MCV 94.1 09/27/2016 1347   MCH 31.3 09/27/2016 1347   MCHC 33.2 09/27/2016 1347   RDW  14.7 09/27/2016 1347   LYMPHSABS 1.1 09/27/2016 1347   MONOABS 0.3 09/27/2016 1347   EOSABS 0.4 09/27/2016 1347   BASOSABS 0.0 09/27/2016 1347      Chemistry      Component Value Date/Time   NA 136 09/27/2016 1347   K 3.8 09/27/2016 1347   CL 104 09/27/2016 1347   CO2 27 09/27/2016 1347   BUN 19 09/27/2016 1347   CREATININE 0.54 09/27/2016 1347   CREATININE 0.54 04/15/2014 1027      Component  Value Date/Time   CALCIUM 8.9 09/27/2016 1347   ALKPHOS 54 09/27/2016 1347   AST 25 09/27/2016 1347   ALT 24 09/27/2016 1347   BILITOT 0.3 09/27/2016 1347        PENDING LABS:   RADIOGRAPHIC STUDIES:  Ct Chest W Contrast  Result Date: 09/20/2016 CLINICAL DATA:  Anal cancer follow-up EXAM: CT CHEST, ABDOMEN, AND PELVIS WITH CONTRAST TECHNIQUE: Multidetector CT imaging of the chest, abdomen and pelvis was performed following the standard protocol during bolus administration of intravenous contrast. CONTRAST:  24mL ISOVUE-300 IOPAMIDOL (ISOVUE-300) INJECTION 61% COMPARISON:  10/15/2015 FINDINGS: CT CHEST FINDINGS Cardiovascular: The heart size is normal. There is no pericardial effusion peer aortic atherosclerosis is noted. Calcification within the LAD coronary artery noted. Mediastinum/Nodes: No enlarged mediastinal, hilar, or axillary lymph nodes. Thyroid gland, trachea, and esophagus demonstrate no significant findings. Lungs/Pleura: Lungs are clear. No pleural effusion or pneumothorax. Musculoskeletal: No chest wall mass or suspicious bone lesions identified. CT ABDOMEN PELVIS FINDINGS Hepatobiliary: No focal liver abnormality is seen. No gallstones, gallbladder wall thickening, or biliary dilatation. Pancreas: Unremarkable. No pancreatic ductal dilatation or surrounding inflammatory changes. Spleen: Normal in size without focal abnormality. Adrenals/Urinary Tract: Adrenal glands are unremarkable. Kidneys are normal, without renal calculi, focal lesion, or hydronephrosis. Bladder is  unremarkable. Stomach/Bowel: Stomach is within normal limits. Appendix appears normal. No evidence of bowel wall thickening, distention, or inflammatory changes. Vascular/Lymphatic: Aortic atherosclerosis. No aneurysm. There is no upper abdominal adenopathy identified. No pelvic or inguinal adenopathy. Reproductive: Status post hysterectomy. No adnexal masses. Other: No abdominal wall hernia or abnormality. No abdominopelvic ascites. Musculoskeletal: No acute or significant osseous findings. IMPRESSION: 1. No acute findings and no evidence for recurrent tumor or metastatic disease. 2. Aortic atherosclerosis and coronary artery calcification Electronically Signed   By: Kerby Moors M.D.   On: 09/20/2016 09:52   Ct Abdomen Pelvis W Contrast  Result Date: 09/20/2016 CLINICAL DATA:  Anal cancer follow-up EXAM: CT CHEST, ABDOMEN, AND PELVIS WITH CONTRAST TECHNIQUE: Multidetector CT imaging of the chest, abdomen and pelvis was performed following the standard protocol during bolus administration of intravenous contrast. CONTRAST:  67mL ISOVUE-300 IOPAMIDOL (ISOVUE-300) INJECTION 61% COMPARISON:  10/15/2015 FINDINGS: CT CHEST FINDINGS Cardiovascular: The heart size is normal. There is no pericardial effusion peer aortic atherosclerosis is noted. Calcification within the LAD coronary artery noted. Mediastinum/Nodes: No enlarged mediastinal, hilar, or axillary lymph nodes. Thyroid gland, trachea, and esophagus demonstrate no significant findings. Lungs/Pleura: Lungs are clear. No pleural effusion or pneumothorax. Musculoskeletal: No chest wall mass or suspicious bone lesions identified. CT ABDOMEN PELVIS FINDINGS Hepatobiliary: No focal liver abnormality is seen. No gallstones, gallbladder wall thickening, or biliary dilatation. Pancreas: Unremarkable. No pancreatic ductal dilatation or surrounding inflammatory changes. Spleen: Normal in size without focal abnormality. Adrenals/Urinary Tract: Adrenal glands are  unremarkable. Kidneys are normal, without renal calculi, focal lesion, or hydronephrosis. Bladder is unremarkable. Stomach/Bowel: Stomach is within normal limits. Appendix appears normal. No evidence of bowel wall thickening, distention, or inflammatory changes. Vascular/Lymphatic: Aortic atherosclerosis. No aneurysm. There is no upper abdominal adenopathy identified. No pelvic or inguinal adenopathy. Reproductive: Status post hysterectomy. No adnexal masses. Other: No abdominal wall hernia or abnormality. No abdominopelvic ascites. Musculoskeletal: No acute or significant osseous findings. IMPRESSION: 1. No acute findings and no evidence for recurrent tumor or metastatic disease. 2. Aortic atherosclerosis and coronary artery calcification Electronically Signed   By: Kerby Moors M.D.   On: 09/20/2016 09:52     PATHOLOGY:    ASSESSMENT AND PLAN:  Anal cancer ([place]) Stage II anal cancer, S/P concomitant chemoradiation consisting of 5FU and Mitomycin C with curative intent, finishing therapy on 12/06/2015.  Oncology history is developed.  Staging in [facility] problem list is completed.  Labs today: CBC diff, CMET.  I personally reviewed and went over laboratory results with the patient.  The results are noted within this dictation.  I personally reviewed and went over radiographic studies with the patient.  The results are noted within this dictation.  CT CAP is negative for any recurrent disease.  She is up to date on mammogram.  She is due in Sept 2018.  I personally reviewed and went over radiographic studies with the patient.  The results are noted within this dictation.    I reviewed the NCCN guidelines pertaining to surveillance.  She has follow-up with Dr. Leighton Wagner in Feb [id number].  Labs in 3 months: CBC diff, CMET.  Return in 3 months for labs and follow-up appointment.  History of ovarian cancer Stage III ovarian cancer, S/P debulking surgery on 07/03/2014 by Dr. Alycia Wagner, followed by 6  cycles of Carboplatin/Paclitaxel with last treatment on 12/07/2014.  Oncology history developed.  Labs today: CBC diff, CMET, CA 125.  I personally reviewed and went over laboratory results with the patient.  The results are noted within this dictation.  I personally reviewed and went over radiographic studies with the patient.  The results are noted within this dictation.  CT CAP was negative for any recurrence of disease.  Labs in 3 months: CBC diff, CMET, CA 125.  Return in 3 months for follow-up.   ORDERS PLACED FOR THIS ENCOUNTER: Orders Placed This Encounter  Procedures  . CBC with Differential  . Comprehensive metabolic panel  . CA 125    MEDICATIONS PRESCRIBED THIS ENCOUNTER: No orders of the defined types were placed in this encounter.   THERAPY PLAN:  NCCN guidelines recommends the following surveillance for anal cancer (2.2017):  A. DRE every 3-6 months for 5 years  B. Anoscopy every 6-12 months x 3 years  C. Inguinal node palpation every 3-6 months for 5 years  D. T3-T4 or inguinal node positive disease, CT [facility] annually x 3 years.  NCCN guidelines for surveillance of Stage I-IV Epithelial Ovarian Cancer/ Fallopian Tube Cancer/ Primary Peritoneal Cancer in those experiencing a complete response recommends the following (1.2017):  A. Vists every 2-4 months for first 2 years, then 3-6 months for 3 years, and then annually after 5 years.  B. Physical exam including pelvic exam  C. CA-125 or other tumor markers if initially elevated.  D. Refer for genetic risk evaluation if not previously performed.   E. CBC and chemistry profile as indicated.   F. CT Chest/Abdomen/Pelvis, MRI, PET-CT, or PET (skull base to mid-thigh) as clinically indicated.  G. Chest X-ray as indicted.   H. Long-term wellness care.   All questions were answered. The patient knows to call the clinic with any problems, questions or concerns. We can certainly see the patient much sooner if  necessary.  Patient and plan discussed with Dr. Ancil Linsey and she is in agreement with the aforementioned.   This note is electronically signed by: Doy Mince 09/27/2016 6:12 PM

## 2016-09-27 NOTE — Patient Instructions (Signed)
[facility] Discharge Instructions  RECOMMENDATIONS MADE BY THE CONSULTANT AND ANY TEST RESULTS WILL BE SENT TO YOUR REFERRING PHYSICIAN.  Port flush with labs  Thank you for choosing [facility] at [facility] to provide your oncology and hematology care.  To afford each patient quality time with our provider, please arrive at least 15 minutes before your scheduled appointment time.    If you have a lab appointment with the Cancer Center please come in thru the  Main Entrance and check in at the main information desk  You need to re-schedule your appointment should you arrive 10 or more minutes late.  We strive to give you quality time with our providers, and arriving late affects you and other patients whose appointments are after yours.  Also, if you no show three or more times for appointments you may be dismissed from the clinic at the providers discretion.     Again, thank you for choosing [facility].  Our hope is that these requests will decrease the amount of time that you wait before being seen by our physicians.       _____________________________________________________________  Should you have questions after your visit to [facility], please contact our office at [phone] between the hours of 8:30 a.m. and 4:30 p.m.  Voicemails left after 4:30 p.m. will not be returned until the following business day.  For prescription refill requests, have your pharmacy contact our office.       Resources For Cancer Patients and their Caregivers ? American Cancer Society: Can assist with transportation, wigs, general needs, runs Look Good Feel Better.        1-888-227-6333 ? Cancer Care: Provides financial assistance, online support groups, medication/co-pay assistance.  1-800-813-HOPE (4673) ? [facility] Assists [place] cancer patients and their families through  emotional , educational and financial support.  336-427-4357 ? [place] DSS Where to apply for food stamps, Medicaid and utility assistance. 336-342-1394 ? [facility]: Transportation to medical appointments. 336-347-2287 ? Social Security Administration: May apply for disability if have a Stage IV cancer. 336-342-7796 1-800-772-1213 ? [place] Aging, Disability and Transit Services: Assists with nutrition, care and transit needs. 336-349-2343  Cancer Center Support Programs: @10RELATIVEDAYS@ > Cancer Support Group  2nd Tuesday of the month 1pm-2pm, Journey Room  > Creative Journey  3rd Tuesday of the month 1130am-1pm, Journey Room  > Look Good Feel Better  1st Wednesday of the month 10am-12 noon, Journey Room (Call American Cancer Society to register 1-800-395-5775)    

## 2016-09-27 NOTE — Patient Instructions (Signed)
[place] [facility] Discharge Instructions  RECOMMENDATIONS MADE BY THE CONSULTANT AND ANY TEST RESULTS WILL BE SENT TO YOUR REFERRING PHYSICIAN.  Port flush with labs today  Port flush in 6 weeks  Labs in 3 months  Return in 3 months for follow up    Thank you for choosing [place] at [facility] to provide your oncology and hematology care.  To afford each patient quality time with our provider, please arrive at least 15 minutes before your scheduled appointment time.    If you have a lab appointment with the [facility] please come in thru the  Main Entrance and check in at the main information desk  You need to re-schedule your appointment should you arrive 10 or more minutes late.  We strive to give you quality time with our providers, and arriving late affects you and other patients whose appointments are after yours.  Also, if you no show three or more times for appointments you may be dismissed from the clinic at the providers discretion.     Again, thank you for choosing [facility].  Our hope is that these requests will decrease the amount of time that you wait before being seen by our physicians.       _____________________________________________________________  Should you have questions after your visit to [facility], please contact our office at [phone] between the hours of 8:30 a.m. and 4:30 p.m.  Voicemails left after 4:30 p.m. will not be returned until the following business day.  For prescription refill requests, have your pharmacy contact our office.       Resources For Cancer Patients and their Caregivers ? American Cancer Society: Can assist with transportation, wigs, general needs, runs Look Good Feel Better.        340-203-3998 ? Cancer Care: Provides financial assistance, online support groups, medication/co-pay assistance.  1-800-813-HOPE 562-440-7812) ? [facility] Assists [place] cancer patients and their families through emotional , educational and financial support.  (231)819-0138 ? [place] DSS Where to apply for food stamps, Medicaid and utility assistance. (757)791-2218 ? RCATS: Transportation to medical appointments. 838-075-1545 ? Social Security Administration: May apply for disability if have a Stage IV cancer. (254) 612-4376 442 476 1316 ? [employer], Disability and Transit Services: Assists with nutrition, care and transit needs. [place] Support Programs: @10RELATIVEDAYS @ > Cancer Support Group  2nd Tuesday of the month 1pm-2pm, Journey Room  > Creative Journey  3rd Tuesday of the month 1130am-1pm, Journey Room  > Look Good Feel Better  1st Wednesday of the month 10am-12 noon, Journey Room (Call [place] to register [phone])

## 2016-09-28 ENCOUNTER — Ambulatory Visit (INDEPENDENT_AMBULATORY_CARE_PROVIDER_SITE_OTHER): Payer: Medicare Other | Admitting: Psychiatry

## 2016-09-28 ENCOUNTER — Encounter (HOSPITAL_COMMUNITY): Payer: Self-pay | Admitting: Psychiatry

## 2016-09-28 VITALS — BP 127/68 | HR 85 | Ht 66.0 in | Wt 137.0 lb

## 2016-09-28 DIAGNOSIS — F331 Major depressive disorder, recurrent, moderate: Secondary | ICD-10-CM | POA: Diagnosis not present

## 2016-09-28 LAB — CA 125: CA 125: 9.3 U/mL (ref 0.0–38.1)

## 2016-09-28 MED ORDER — DIVALPROEX SODIUM ER 500 MG PO TB24: 500.0000 mg | ORAL_TABLET | Freq: Every day | ORAL | 2 refills | Status: DC

## 2016-09-28 MED ORDER — LORAZEPAM 1 MG PO TABS: 1.0000 mg | ORAL_TABLET | Freq: Two times a day (BID) | ORAL | 3 refills | Status: DC

## 2016-09-28 MED ORDER — ESCITALOPRAM OXALATE 20 MG PO TABS: 20.0000 mg | ORAL_TABLET | Freq: Every morning | ORAL | 2 refills | Status: DC
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
## 2016-10-24 ENCOUNTER — Encounter: Payer: Self-pay | Admitting: Internal Medicine

## 2016-10-24 DIAGNOSIS — Z85048 Personal history of other malignant neoplasm of rectum, rectosigmoid junction, and anus: Secondary | ICD-10-CM | POA: Diagnosis not present

## 2016-11-07 ENCOUNTER — Ambulatory Visit (INDEPENDENT_AMBULATORY_CARE_PROVIDER_SITE_OTHER): Payer: Medicare Other | Admitting: Family Medicine

## 2016-11-07 ENCOUNTER — Encounter: Payer: Self-pay | Admitting: Family Medicine

## 2016-11-07 VITALS — BP 134/86 | HR 84 | Temp 98.6°F | Resp 16 | Ht 66.0 in | Wt 141.1 lb

## 2016-11-07 DIAGNOSIS — M25551 Pain in right hip: Secondary | ICD-10-CM

## 2016-11-07 DIAGNOSIS — Z1159 Encounter for screening for other viral diseases: Secondary | ICD-10-CM

## 2016-11-07 DIAGNOSIS — Z8543 Personal history of malignant neoplasm of ovary: Secondary | ICD-10-CM

## 2016-11-07 DIAGNOSIS — K589 Irritable bowel syndrome without diarrhea: Secondary | ICD-10-CM

## 2016-11-07 DIAGNOSIS — I10 Essential (primary) hypertension: Secondary | ICD-10-CM

## 2016-11-07 DIAGNOSIS — Z23 Encounter for immunization: Secondary | ICD-10-CM | POA: Diagnosis not present

## 2016-11-07 DIAGNOSIS — K219 Gastro-esophageal reflux disease without esophagitis: Secondary | ICD-10-CM

## 2016-11-07 DIAGNOSIS — M25552 Pain in left hip: Secondary | ICD-10-CM

## 2016-11-07 MED ORDER — DICLOFENAC SODIUM 75 MG PO TBEC: 75.0000 mg | DELAYED_RELEASE_TABLET | Freq: Two times a day (BID) | ORAL | 3 refills | Status: DC

## 2016-11-07 NOTE — Patient Instructions (Addendum)
Continue to eat well  Need to see an orthopedic for the hip pain Need to try to exercise more  Need lab testing  prevnar today  Need old records  Need to see you twice a yaer

## 2016-11-07 NOTE — Progress Notes (Signed)
Chief Complaint  Patient presents with  . Establish Care   Patient is new to establish. Her oncology and psychiatry records are available to me, her old records from primary care requested She has successfully been treated for rectal cancer and for ovarian cancer, both in the last 2-3 years and currently is doing well. Her weight is stable. Her diet is good. She is unable to accomplish regular exercise because of hip pain. She states the hip pain has been going on for years. She has not had x-rays were seen a specialist. She sees a psychiatrist for depression. Question bipolar illness. She is currently stable on a combination of Depakote, lorazepam and Lexapro. She is up-to-date with her health screenings. She doesn't need a Pap smear, and has had a mammogram and colonoscopy recently. Her immunizations are up-to-date although she has not yet had a pneumonia shot. Prevnar is given today.   Patient Active Problem List   Diagnosis Date Noted  . Hypertension 09/27/2016  . GERD (gastroesophageal reflux disease) 09/27/2016  . Back pain 09/27/2016  . Anxiety 09/27/2016  . Allergy 09/27/2016  . Radiation proctitis 12/23/2015  . Anal cancer ([place]) 11/19/2015  . History of ovarian cancer 08/03/2015  . Prolapse of vaginal vault after hysterectomy 07/20/2015  . Migraines 05/20/2015  . Port-a-cath in place 01/13/2015  . Epithelial ovarian cancer, FIGO stage IIIA ([place]) 07/16/2014  . Abdominal carcinomatosis ([place]) 06/18/2014  . Bipolar 1 disorder ([place]) 12/27/2011  . Kidney stones 12/27/2011  . IBS (irritable colon syndrome) 12/27/2011  . Depression 11/07/2011    Outpatient Encounter Prescriptions as of 11/07/2016  Medication Sig  . Aspirin-Salicylamide-Caffeine (BC HEADACHE POWDER PO) Take 1 packet by mouth as needed.   Marland Kitchen b complex vitamins tablet Take 1 tablet by mouth daily.  . diclofenac (VOLTAREN) 75 MG EC tablet Take 1 tablet (75 mg total) by mouth 2 (two) times daily.  Marland Kitchen dicyclomine  (BENTYL) 10 MG capsule 10 to 20 mg by mouth 4 times daily as needed  . divalproex (DEPAKOTE ER) 500 MG 24 hr tablet Take 1 tablet (500 mg total) by mouth daily.  Marland Kitchen escitalopram (LEXAPRO) 20 MG tablet Take 1 tablet (20 mg total) by mouth every morning.  Marland Kitchen KRILL OIL PO Take 350 mg by mouth daily.  . lansoprazole (PREVACID) 15 MG capsule Take 15 mg by mouth as needed.   Marland Kitchen LORazepam (ATIVAN) 1 MG tablet Take 1 tablet (1 mg total) by mouth 2 (two) times daily.  . Multiple Vitamin (MULTIVITAMIN) tablet Take 1 tablet by mouth daily.  . valACYclovir (VALTREX) 1000 MG tablet Take 1,000 mg by mouth every morning.    No facility-administered encounter medications on file as of 11/07/2016.     Past Medical History:  Diagnosis Date  . Allergy   . Anal carcinoma [facility]) oncologist-  dr Whitney Muse ([facility])   dx 12/ 2016 [facility] --  completed therapy  03/ 2017  . Anemia   . Anxiety   . Blood transfusion without reported diagnosis   . Carcinoma of ovary, stage 3 [facility]) oncologist-  dr gehrig/  dr Doreene Eland (cancer center in [place])--  no recurrency   dx 09/ 2015  Stage IIIB  papillary ovarian carcinoma  s/p  omentectomy and BSO &  chemotherapy (completed 12-07-2014)  . Chemotherapy induced nausea and vomiting 10/23/2014  . Chronic kidney disease    stones  . DDD (degenerative disc disease), lumbosacral   . GERD (gastroesophageal reflux disease)   . Headache(784.0)   .  History of acute respiratory failure    05-30-2009  drug overdose-- (intubated for 2 days)  and aspiration pneumonia  . History of adenomatous polyp of colon 10/07/2015   tubular adenoma high grade dysplasia  . History of herpes genitalis   . History of kidney stones   . History of ovarian cancer 08/03/2015  . History of suicide attempt    per documentation in epic  05-30-2009  overdose benaodiazepine  . HTN (hypertension)    takes Metoprolol daily  . Hyperlipidemia   . IBS (irritable bowel syndrome)    takes Bentyl daily    . Major depression   . Mood disorder ([place])   . OA (osteoarthritis)   . Prolapse of vaginal vault after hysterectomy 07/20/2015  . Rectovaginal fistula 08/05/2015  . Sigmoid diverticulosis   . Smokers' cough [facility])     Past Surgical History:  Procedure Laterality Date  . BREAST ENHANCEMENT SURGERY  1986  . [place]  . COLONOSCOPY N/A 10/07/2015   Procedure: COLONOSCOPY;  Surgeon: Rogene Houston, MD;  [facility];  Service: Endoscopy;  Laterality: N/A;  7:30  . EXPLORATORY LAPAROTOMY/ OMENTECTOMY/  BILATERAL SALPINGOOPHORECTOMY/  [facility] PLACEMENT  07-03-2014   [facility]  . KNEE ARTHROSCOPY Left 09-22-2004  . LAPAROSCOPY N/A 06/02/2014   Procedure: DIAGNOSTIC LAPAROSCOPY, OMENTAL BIOPSY, RIGHT OVARY BIOPSY, LYSIS OF ADHESIONS;  Surgeon: Fanny Skates, MD;  Location: [place];  Service: General;  Laterality: N/A;  . MUCOSAL ADVANCEMENT FLAP N/A 06/15/2016   Procedure: EXCISION RECTOVAGINAOL FISTULA WITH MUCOSAL ADVANCEMENT FLAP;  Surgeon: Leighton Ruff, MD;  Location: [place];  Service: General;  Laterality: N/A;  . PLACEMENT OF SETON N/A 09/09/2015   Procedure: PLACEMENT OF SETON;  Surgeon: Leighton Ruff, MD;  Location: [place];  Service: General;  Laterality: N/A;  . PORT-A-CATH REMOVAL Right 08/17/2014   Procedure: REMOVAL INTRAPERITONEAL CHEMO PORT;  Surgeon: Fanny Skates, MD;  Location: [place];  Service: General;  Laterality: Right;  . PORTACATH PLACEMENT Right 08/17/2014   Procedure:  PLACE NEW PORT A CATH;  Surgeon: Fanny Skates, MD;  Location: [place];  Service: General;  Laterality: Right;  . RECTAL BIOPSY N/A 09/09/2015   Procedure: BIOPSY OF RECTOVAGINAL MASS;  Surgeon: Leighton Ruff, MD;  Location: [place];  Service: General;  Laterality: N/A;  . TUBAL LIGATION  YRS AGO  . VAGINAL HYSTERECTOMY  1981   fibroids    Social History   Social History  .  Marital status: Married    Spouse name: Coralyn Mark  . Number of children: 3  . Years of education: 12   Occupational History  . retire     [employer]   Social History Main Topics  . Smoking status: Former Smoker    Packs/day: 1.00    Years: 43.00    Types: Cigarettes    Quit date: 05/24/2016  . Smokeless tobacco: Never Used  . Alcohol use No  . Drug use: No  . Sexual activity: Not Currently    Birth control/ protection: Surgical   Other Topics Concern  . Not on file   Social History Narrative   Lives at home with terry   Retired [employer]       Family History  Problem Relation Age of Onset  . Bipolar disorder Mother   . Anxiety disorder Mother   . Dementia Mother   . Depression Mother   . Mental illness Mother   .  Vision loss Mother   . Alcohol abuse Paternal Uncle   . Bipolar disorder Maternal Grandmother   . Dementia Maternal Grandmother   . Alcohol abuse Paternal Uncle   . Stroke Brother   . Deep vein thrombosis Son   . Heart disease Father   . Hyperlipidemia Father   . Hypertension Father   . Stroke Father   . Vision loss Father   . Heart disease Sister     a fib  . Heart disease Brother   . ADD / ADHD Neg Hx   . Drug abuse Neg Hx   . OCD Neg Hx   . Paranoid behavior Neg Hx   . Schizophrenia Neg Hx   . Seizures Neg Hx   . Sexual abuse Neg Hx   . Physical abuse Neg Hx     Review of Systems  Constitutional: Negative for malaise/fatigue and weight loss.  HENT: Negative for congestion and hearing loss.   Eyes: Negative for blurred vision and double vision.  Respiratory: Negative for cough and shortness of breath.   Cardiovascular: Negative for leg swelling.  Gastrointestinal: Positive for diarrhea. Negative for heartburn.       Some loose bowels and fecal soilage  Genitourinary: Negative for dysuria and frequency.       Mild incontinence, known pelvic relaxation  Musculoskeletal: Positive for joint pain. Negative for myalgias.       Hip pain  Skin:  Negative for itching and rash.  Neurological: Negative for dizziness.  Psychiatric/Behavioral: The patient is not nervous/anxious and does not have insomnia.     BP 134/86 (BP Location: Left Arm, Patient Position: Sitting, Cuff Size: Normal)   Pulse 84   Temp 98.6 F (37 C) (Temporal)   Resp 16   Ht 5\' 6"  (1.676 m)   Wt 141 lb 1.3 oz (64 kg)   SpO2 99%   BMI 22.77 kg/m   Physical Exam  Constitutional: She is oriented to person, place, and time. She appears well-developed and well-nourished.  HENT:  Head: Normocephalic and atraumatic.  Right Ear: External ear normal.  Left Ear: External ear normal.  Mouth/Throat: Oropharynx is clear and moist.  Eyes: Conjunctivae are normal. Pupils are equal, round, and reactive to light.  Neck: Normal range of motion. Neck supple. No thyromegaly present.  Cardiovascular: Normal rate, regular rhythm and normal heart sounds.   Pulmonary/Chest: Effort normal and breath sounds normal. No respiratory distress.  Abdominal: Soft. Bowel sounds are normal.  Musculoskeletal: Normal range of motion. She exhibits no edema.  No tenderness low back or pelvis. Bilateral tenderness over greater trochanter. Good range of motion of hips.  Lymphadenopathy:    She has no cervical adenopathy.  Neurological: She is alert and oriented to person, place, and time. She displays normal reflexes.  Gait normal  Skin: Skin is warm and dry.  Psychiatric: She has a normal mood and affect. Her behavior is normal. Thought content normal.  Nursing note and vitals reviewed.  ASSESSMENT/PLAN:   1. Need for hepatitis C screening test  - Hepatitis C antibody  2. Essential hypertension  - Lipid panel - VITAMIN D 25 Hydroxy (Vit-D Deficiency, Fractures) - Urinalysis, Routine w reflex microscopic - Hepatitis C antibody  3. Gastroesophageal reflux disease without esophagitis   4. History of ovarian cancer   5. Irritable bowel syndrome, unspecified type   6. Pain of  both hip joints  - Ambulatory referral to Orthopedic Surgery   Patient Instructions  Continue to eat well  Need to see an orthopedic for the hip pain Need to try to exercise more  Need lab testing  prevnar today  Need old records  Need to see you twice a yaer     Abigail Everts, MD

## 2016-11-16 ENCOUNTER — Encounter (INDEPENDENT_AMBULATORY_CARE_PROVIDER_SITE_OTHER): Payer: Self-pay | Admitting: Orthopaedic Surgery

## 2016-11-16 ENCOUNTER — Ambulatory Visit (INDEPENDENT_AMBULATORY_CARE_PROVIDER_SITE_OTHER): Payer: 59 | Admitting: Orthopaedic Surgery

## 2016-11-16 ENCOUNTER — Ambulatory Visit (INDEPENDENT_AMBULATORY_CARE_PROVIDER_SITE_OTHER): Payer: 59

## 2016-11-16 VITALS — BP 131/69 | HR 83 | Ht 66.0 in | Wt 141.0 lb

## 2016-11-16 DIAGNOSIS — M7061 Trochanteric bursitis, right hip: Secondary | ICD-10-CM | POA: Diagnosis not present

## 2016-11-16 DIAGNOSIS — M25552 Pain in left hip: Secondary | ICD-10-CM

## 2016-11-16 DIAGNOSIS — M25551 Pain in right hip: Secondary | ICD-10-CM

## 2016-11-16 NOTE — Progress Notes (Signed)
Office Visit Note   Patient: Abigail Wagner           Date of Birth: [birth year]           MRN: [id number] Visit Date: 11/16/2016              Requested by: Raylene Everts, MD [phone] [place], [place] [address] PCP: Raylene Everts, MD   Assessment & Plan: Visit Diagnoses:  1. Bilateral hip pain   2. Trochanteric bursitis, right hip     Plan: Right trochanteric bursitis worse than left. Trochanteric injection performed with improvement in symptoms and less discomfort. We'll check in one week and if she gets good relief from the right trochanteric injection we can consider left injection. Plain radiographs demonstrate some lumbar degenerative facet changes and slight curvature and only L4-5 L5-S1 are visualized. No radicular or myelopathic and no claudication symptoms are present. I discussed with her that her trochanteric bursitis is likely secondary to back problems and hopefully the injection will give her good relief.  Follow-Up Instructions: Return in about 1 week (around 11/23/2016).   Orders:  Orders Placed This Encounter  Procedures  . XR Pelvis 1-2 Views   No orders of the defined types were placed in this encounter.     Procedures: No procedures performed   Clinical Data: No additional findings.   Subjective: Chief Complaint  Patient presents with  . Right Hip - Pain  . Left Hip - Pain    Patient presents with bilateral hip pain, right greater than left. She states this is a chronic condition since about 2012 and she has no known injury. The pain is mostly lateral hips.  She has used diclofenac, heat, diclofenac gel.    Review of Systems  Constitutional: Negative for chills and diaphoresis.  HENT: Negative for ear discharge, ear pain and nosebleeds.   Eyes: Negative for discharge and visual disturbance.  Respiratory: Negative for cough, choking and shortness of breath.   Cardiovascular: Negative for chest pain and palpitations.    Gastrointestinal: Negative for abdominal distention and abdominal pain.       Positive for IBS and GERD. Positive for anal cancer.  Endocrine: Negative for cold intolerance and heat intolerance.  Genitourinary: Negative for flank pain and hematuria.       Positive for stage IIIa ovarian cancer.  Musculoskeletal: Positive for back pain.       Bilateral lateral hip pain. Negative radicular symptoms. Negative claudication symptoms.  Skin: Negative for rash and wound.  Neurological: Negative for seizures and speech difficulty.  Hematological: Negative for adenopathy. Does not bruise/bleed easily.  Psychiatric/Behavioral: Negative for agitation and suicidal ideas.       Positive for bipolar disorder     Objective: Vital Signs: BP 131/69   Pulse 83   Ht 5\' 6"  (1.676 m)   Wt 141 lb (64 kg)   BMI 22.76 kg/m   Physical Exam  Constitutional: She is oriented to person, place, and time. She appears well-developed.  HENT:  Head: Normocephalic.  Right Ear: External ear normal.  Left Ear: External ear normal.  Eyes: Pupils are equal, round, and reactive to light.  Neck: No tracheal deviation present. No thyromegaly present.  Cardiovascular: Normal rate.   Pulmonary/Chest: Effort normal.  Abdominal: Soft.  Musculoskeletal:  Positive for bilateral trochanteric tenderness worse on the right than left mild sciatic notch tenderness they've straight leg raising. Knee and ankle jerk are 2+ and symmetrical normal hip  range of motion. Lower extremity reflexes and isolated motor testing is normal. Iliotibial band distally is normal. The range of motion is full no crepitus. Good internal/external rotation of both hips without discomfort. Good hip flexion extension strength hamstrings quads are normal.  Neurological: She is alert and oriented to person, place, and time.  Skin: Skin is warm and dry.  Psychiatric: She has a normal mood and affect. Her behavior is normal.    Ortho Exam  Specialty  Comments:  No specialty comments available.  Imaging: Xr Pelvis 1-2 Views  Result Date: 11/16/2016 [facility] pelvis x-ray obtained. This shows normal hips and SI joint. Slight pelvis osteopenia is suggested. Lumbar degenerative facet changes with mild curvature L4-5 L5-S1. Impression: Normal hips and pelvis. Lumbar facet degenerative changes    PMFS History: Patient Active Problem List   Diagnosis Date Noted  . Hypertension 09/27/2016  . GERD (gastroesophageal reflux disease) 09/27/2016  . Back pain 09/27/2016  . Anxiety 09/27/2016  . Allergy 09/27/2016  . Radiation proctitis 12/23/2015  . Anal cancer ([place]) 11/19/2015  . History of ovarian cancer 08/03/2015  . Prolapse of vaginal vault after hysterectomy 07/20/2015  . Migraines 05/20/2015  . Port-a-cath in place 01/13/2015  . Epithelial ovarian cancer, FIGO stage IIIA ([place]) 07/16/2014  . Abdominal carcinomatosis ([place]) 06/18/2014  . Bipolar 1 disorder ([place]) 12/27/2011  . Kidney stones 12/27/2011  . IBS (irritable colon syndrome) 12/27/2011  . Depression 11/07/2011   Past Medical History:  Diagnosis Date  . Allergy   . Anal carcinoma [facility]) oncologist-  dr Whitney Muse ([facility])   dx 12/ 2016 [facility] --  completed therapy  03/ 2017  . Anemia   . Anxiety   . Blood transfusion without reported diagnosis   . Carcinoma of ovary, stage 3 [facility]) oncologist-  dr gehrig/  dr Doreene Eland ([place] [facility] [place])--  no recurrency   dx 09/ 2015  Stage IIIB  papillary ovarian carcinoma  s/p  omentectomy and BSO &  chemotherapy (completed 12-07-2014)  . Chemotherapy induced nausea and vomiting 10/23/2014  . Chronic kidney disease    stones  . DDD (degenerative disc disease), lumbosacral   . GERD (gastroesophageal reflux disease)   . Headache(784.0)   . History of acute respiratory failure    05-30-2009  drug overdose-- (intubated for 2 days)  and aspiration pneumonia  . History of adenomatous polyp of colon 10/07/2015   tubular  adenoma high grade dysplasia  . History of herpes genitalis   . History of kidney stones   . History of ovarian cancer 08/03/2015  . History of suicide attempt    per documentation in epic  05-30-2009  overdose benaodiazepine  . HTN (hypertension)    takes Metoprolol daily  . Hyperlipidemia   . IBS (irritable bowel syndrome)    takes Bentyl daily  . Major depression   . Mood disorder ([place])   . OA (osteoarthritis)   . Prolapse of vaginal vault after hysterectomy 07/20/2015  . Rectovaginal fistula 08/05/2015  . Sigmoid diverticulosis   . Smokers' cough ([place])     Family History  Problem Relation Age of Onset  . Bipolar disorder Mother   . Anxiety disorder Mother   . Dementia Mother   . Depression Mother   . Mental illness Mother   . Vision loss Mother   . Alcohol abuse Paternal Uncle   . Bipolar disorder Maternal Grandmother   . Dementia Maternal Grandmother   . Alcohol abuse Paternal Uncle   .  Stroke Brother   . Deep vein thrombosis Son   . Heart disease Father   . Hyperlipidemia Father   . Hypertension Father   . Stroke Father   . Vision loss Father   . Heart disease Sister     a fib  . Heart disease Brother   . ADD / ADHD Neg Hx   . Drug abuse Neg Hx   . OCD Neg Hx   . Paranoid behavior Neg Hx   . Schizophrenia Neg Hx   . Seizures Neg Hx   . Sexual abuse Neg Hx   . Physical abuse Neg Hx     Past Surgical History:  Procedure Laterality Date  . BREAST ENHANCEMENT SURGERY  1986  . [place]  . COLONOSCOPY N/A 10/07/2015   Procedure: COLONOSCOPY;  Surgeon: Rogene Houston, MD;  [facility];  Service: Endoscopy;  Laterality: N/A;  7:30  . EXPLORATORY LAPAROTOMY/ OMENTECTOMY/  BILATERAL SALPINGOOPHORECTOMY/  [facility] PLACEMENT  07-03-2014   [facility]  . KNEE ARTHROSCOPY Left 09-22-2004  . LAPAROSCOPY N/A 06/02/2014   Procedure: DIAGNOSTIC LAPAROSCOPY, OMENTAL BIOPSY, RIGHT OVARY BIOPSY, LYSIS OF ADHESIONS;  Surgeon: Fanny Skates, MD;   Location: [place];  Service: General;  Laterality: N/A;  . MUCOSAL ADVANCEMENT FLAP N/A 06/15/2016   Procedure: EXCISION RECTOVAGINAOL FISTULA WITH MUCOSAL ADVANCEMENT FLAP;  Surgeon: Leighton Ruff, MD;  Location: [place];  Service: General;  Laterality: N/A;  . PLACEMENT OF SETON N/A 09/09/2015   Procedure: PLACEMENT OF SETON;  Surgeon: Leighton Ruff, MD;  Location: [place];  Service: General;  Laterality: N/A;  . PORT-A-CATH REMOVAL Right 08/17/2014   Procedure: REMOVAL INTRAPERITONEAL CHEMO PORT;  Surgeon: Fanny Skates, MD;  Location: [place];  Service: General;  Laterality: Right;  . PORTACATH PLACEMENT Right 08/17/2014   Procedure:  PLACE NEW PORT A CATH;  Surgeon: Fanny Skates, MD;  Location: [place];  Service: General;  Laterality: Right;  . RECTAL BIOPSY N/A 09/09/2015   Procedure: BIOPSY OF RECTOVAGINAL MASS;  Surgeon: Leighton Ruff, MD;  Location: [place];  Service: General;  Laterality: N/A;  . TUBAL LIGATION  YRS AGO  . VAGINAL HYSTERECTOMY  1981   fibroids   Social History   Occupational History  . retire     [employer]   Social History Main Topics  . Smoking status: Former Smoker    Packs/day: 1.00    Years: 43.00    Types: Cigarettes    Quit date: 05/24/2016  . Smokeless tobacco: Never Used  . Alcohol use No  . Drug use: No  . Sexual activity: Not Currently    Birth control/ protection: Surgical

## 2016-11-23 ENCOUNTER — Ambulatory Visit (INDEPENDENT_AMBULATORY_CARE_PROVIDER_SITE_OTHER): Payer: Medicare Other | Admitting: Orthopaedic Surgery

## 2016-11-23 ENCOUNTER — Encounter (HOSPITAL_COMMUNITY): Payer: Self-pay

## 2016-11-23 ENCOUNTER — Encounter (HOSPITAL_COMMUNITY): Payer: Medicare Other | Attending: Oncology

## 2016-11-23 ENCOUNTER — Encounter (INDEPENDENT_AMBULATORY_CARE_PROVIDER_SITE_OTHER): Payer: Self-pay | Admitting: Orthopaedic Surgery

## 2016-11-23 VITALS — BP 135/68 | HR 83 | Ht 66.0 in | Wt 140.0 lb

## 2016-11-23 VITALS — BP 134/72 | HR 77 | Temp 98.0°F | Resp 18

## 2016-11-23 DIAGNOSIS — M7062 Trochanteric bursitis, left hip: Secondary | ICD-10-CM | POA: Diagnosis not present

## 2016-11-23 DIAGNOSIS — Z85048 Personal history of other malignant neoplasm of rectum, rectosigmoid junction, and anus: Secondary | ICD-10-CM

## 2016-11-23 DIAGNOSIS — C569 Malignant neoplasm of unspecified ovary: Secondary | ICD-10-CM | POA: Insufficient documentation

## 2016-11-23 DIAGNOSIS — C210 Malignant neoplasm of anus, unspecified: Secondary | ICD-10-CM | POA: Insufficient documentation

## 2016-11-23 DIAGNOSIS — Z452 Encounter for adjustment and management of vascular access device: Secondary | ICD-10-CM

## 2016-11-23 MED ORDER — LIDOCAINE HCL 1 % IJ SOLN
0.5000 mL | INTRAMUSCULAR | Status: AC | PRN
  Administered 2016-11-23: .5 mL

## 2016-11-23 MED ORDER — BUPIVACAINE HCL 0.5 % IJ SOLN
2.0000 mL | INTRAMUSCULAR | Status: AC | PRN
  Administered 2016-11-23: 2 mL via INTRA_ARTICULAR

## 2016-11-23 MED ORDER — SODIUM CHLORIDE 0.9% FLUSH
20.0000 mL | INTRAVENOUS | Status: DC | PRN
  Administered 2016-11-23: 20 mL via INTRAVENOUS
  Filled 2016-11-23: qty 20

## 2016-11-23 MED ORDER — HEPARIN SOD (PORK) LOCK FLUSH 100 UNIT/ML IV SOLN
500.0000 [IU] | Freq: Once | INTRAVENOUS | Status: AC
  Administered 2016-11-23: 500 [IU] via INTRAVENOUS
  Filled 2016-11-23: qty 5

## 2016-11-23 MED ORDER — METHYLPREDNISOLONE ACETATE 40 MG/ML IJ SUSP
40.0000 mg | INTRAMUSCULAR | Status: AC | PRN
  Administered 2016-11-23: 40 mg via INTRA_ARTICULAR

## 2016-11-23 NOTE — Patient Instructions (Signed)
[facility] at [facility] Discharge Instructions  RECOMMENDATIONS MADE BY THE CONSULTANT AND ANY TEST RESULTS WILL BE SENT TO YOUR REFERRING PHYSICIAN.  Port flush today.    Thank you for choosing [facility] at [facility] to provide your oncology and hematology care.  To afford each patient quality time with our provider, please arrive at least 15 minutes before your scheduled appointment time.    If you have a lab appointment with the Cancer Center please come in thru the  Main Entrance and check in at the main information desk  You need to re-schedule your appointment should you arrive 10 or more minutes late.  We strive to give you quality time with our providers, and arriving late affects you and other patients whose appointments are after yours.  Also, if you no show three or more times for appointments you may be dismissed from the clinic at the providers discretion.     Again, thank you for choosing [facility].  Our hope is that these requests will decrease the amount of time that you wait before being seen by our physicians.       _____________________________________________________________  Should you have questions after your visit to [facility], please contact our office at [phone] between the hours of 8:30 a.m. and 4:30 p.m.  Voicemails left after 4:30 p.m. will not be returned until the following business day.  For prescription refill requests, have your pharmacy contact our office.       Resources For Cancer Patients and their Caregivers ? American Cancer Society: Can assist with transportation, wigs, general needs, runs Look Good Feel Better.        1-888-227-6333 ? Cancer Care: Provides financial assistance, online support groups, medication/co-pay assistance.  1-800-813-HOPE (4673) ? [facility] Assists [place] cancer patients and their families through  emotional , educational and financial support.  336-427-4357 ? [place] DSS Where to apply for food stamps, Medicaid and utility assistance. 336-342-1394 ? [facility]: Transportation to medical appointments. 336-347-2287 ? Social Security Administration: May apply for disability if have a Stage IV cancer. 336-342-7796 1-800-772-1213 ? [place] Aging, Disability and Transit Services: Assists with nutrition, care and transit needs. 336-349-2343  Cancer Center Support Programs: @[email or web address]@ > Cancer Support Group  2nd Tuesday of the month 1pm-2pm, Journey Room  > Creative Journey  3rd Tuesday of the month 1130am-1pm, Journey Room  > Look Good Feel Better  1st Wednesday of the month 10am-12 noon, Journey Room (Call American Cancer Society to register 1-800-395-5775)    

## 2016-11-23 NOTE — Progress Notes (Signed)
Office Visit Note   Patient: Abigail Wagner           Date of Birth: [birth year]           MRN: [id number] Visit Date: 11/23/2016              Requested by: Raylene Everts, MD [phone] [place], [place] [address] PCP: Raylene Everts, MD   Assessment & Plan: Visit Diagnoses:  1. Trochanteric bursitis, left hip     Plan: Patient had good relief with the right greater trochanteric injection and requested the left injected.  Follow-Up Instructions: Return if symptoms worsen or fail to improve.   Orders:  Orders Placed This Encounter  Procedures  . Large Joint Injection/Arthrocentesis   No orders of the defined types were placed in this encounter.     Procedures: Large Joint Inj Date/Time: 11/23/2016 2:30 PM Performed by: Marybelle Killings Authorized by: Rodell Perna C   Location:  Hip Site:  L greater trochanter Needle Size:  22 G Medications:  0.5 mL lidocaine 1 %; 2 mL bupivacaine 0.5 %; 40 mg methylPREDNISolone acetate 40 MG/ML Aspiration Attempted: No       Clinical Data: No additional findings.   Subjective: Chief Complaint  Patient presents with  . Left Hip - Pain    Patient returns after having right troch injection on Feb. 22, 2018. She states that she got good relief from that injection and requests that we inject the left today.     Review of Systems 14 point review of systems updated unchanged from last week office visit. Right trochanter is better but she states the left is still bothering her. She requested left trochanteric injection.   Objective: Vital Signs: BP 135/68   Pulse 83   Ht 5\' 6"  (1.676 m)   Wt 140 lb (63.5 kg)   BMI 22.60 kg/m   Physical Exam  Constitutional: She is oriented to person, place, and time. She appears well-developed.  HENT:  Head: Normocephalic.  Right Ear: External ear normal.  Left Ear: External ear normal.  Eyes: Pupils are equal, round, and reactive to light.  Neck: No tracheal deviation  present. No thyromegaly present.  Cardiovascular: Normal rate.   Pulmonary/Chest: Effort normal.  Abdominal: Soft.  Musculoskeletal:  Tenderness the left greater trochanter right is nontender. She does have the scoliosis present no static notch tenderness no pain with hip range of motion. Skin over the hip is normal. No hip flexion contracture.  Neurological: She is alert and oriented to person, place, and time.  Skin: Skin is warm and dry.  Psychiatric: She has a normal mood and affect. Her behavior is normal.    Ortho Exam  Specialty Comments:  No specialty comments available.  Imaging: No results found.   PMFS History: Patient Active Problem List   Diagnosis Date Noted  . Hypertension 09/27/2016  . GERD (gastroesophageal reflux disease) 09/27/2016  . Back pain 09/27/2016  . Anxiety 09/27/2016  . Allergy 09/27/2016  . Radiation proctitis 12/23/2015  . Anal cancer ([place]) 11/19/2015  . History of ovarian cancer 08/03/2015  . Prolapse of vaginal vault after hysterectomy 07/20/2015  . Migraines 05/20/2015  . Port-a-cath in place 01/13/2015  . Epithelial ovarian cancer, FIGO stage IIIA ([place]) 07/16/2014  . Abdominal carcinomatosis ([place]) 06/18/2014  . Bipolar 1 disorder ([place]) 12/27/2011  . Kidney stones 12/27/2011  . IBS (irritable colon syndrome) 12/27/2011  . Depression 11/07/2011   Past Medical History:  Diagnosis Date  . Allergy   . Anal carcinoma [facility]) oncologist-  dr Whitney Muse ([facility])   dx 12/ 2016 [facility] --  completed therapy  03/ 2017  . Anemia   . Anxiety   . Blood transfusion without reported diagnosis   . Carcinoma of ovary, stage 3 [facility]) oncologist-  dr gehrig/  dr Doreene Eland ([place] center [place] [facility])--  no recurrency   dx 09/ 2015  Stage IIIB  papillary ovarian carcinoma  s/p  omentectomy and BSO &  chemotherapy (completed 12-07-2014)  . Chemotherapy induced nausea and vomiting 10/23/2014  . Chronic kidney disease    stones  . DDD (degenerative  disc disease), lumbosacral   . GERD (gastroesophageal reflux disease)   . Headache(784.0)   . History of acute respiratory failure    05-30-2009  drug overdose-- (intubated for 2 days)  and aspiration pneumonia  . History of adenomatous polyp of colon 10/07/2015   tubular adenoma high grade dysplasia  . History of herpes genitalis   . History of kidney stones   . History of ovarian cancer 08/03/2015  . History of suicide attempt    per documentation in epic  05-30-2009  overdose benaodiazepine  . HTN (hypertension)    takes Metoprolol daily  . Hyperlipidemia   . IBS (irritable bowel syndrome)    takes Bentyl daily  . Major depression   . Mood disorder ([place])   . OA (osteoarthritis)   . Prolapse of vaginal vault after hysterectomy 07/20/2015  . Rectovaginal fistula 08/05/2015  . Sigmoid diverticulosis   . Smokers' cough ([place])     Family History  Problem Relation Age of Onset  . Bipolar disorder Mother   . Anxiety disorder Mother   . Dementia Mother   . Depression Mother   . Mental illness Mother   . Vision loss Mother   . Alcohol abuse Paternal Uncle   . Bipolar disorder Maternal Grandmother   . Dementia Maternal Grandmother   . Alcohol abuse Paternal Uncle   . Stroke Brother   . Deep vein thrombosis Son   . Heart disease Father   . Hyperlipidemia Father   . Hypertension Father   . Stroke Father   . Vision loss Father   . Heart disease Sister     a fib  . Heart disease Brother   . ADD / ADHD Neg Hx   . Drug abuse Neg Hx   . OCD Neg Hx   . Paranoid behavior Neg Hx   . Schizophrenia Neg Hx   . Seizures Neg Hx   . Sexual abuse Neg Hx   . Physical abuse Neg Hx     Past Surgical History:  Procedure Laterality Date  . BREAST ENHANCEMENT SURGERY  1986  . [place]  . COLONOSCOPY N/A 10/07/2015   Procedure: COLONOSCOPY;  Surgeon: Rogene Houston, MD;  [facility];  Service: Endoscopy;  Laterality: N/A;  7:30  . EXPLORATORY LAPAROTOMY/  OMENTECTOMY/  BILATERAL SALPINGOOPHORECTOMY/  [facility] PLACEMENT  07-03-2014   [facility]  . KNEE ARTHROSCOPY Left 09-22-2004  . LAPAROSCOPY N/A 06/02/2014   Procedure: DIAGNOSTIC LAPAROSCOPY, OMENTAL BIOPSY, RIGHT OVARY BIOPSY, LYSIS OF ADHESIONS;  Surgeon: Fanny Skates, MD;  Location: [place];  Service: General;  Laterality: N/A;  . MUCOSAL ADVANCEMENT FLAP N/A 06/15/2016   Procedure: EXCISION RECTOVAGINAOL FISTULA WITH MUCOSAL ADVANCEMENT FLAP;  Surgeon: Leighton Ruff, MD;  Location: [place];  Service: General;  Laterality: N/A;  .  PLACEMENT OF SETON N/A 09/09/2015   Procedure: PLACEMENT OF SETON;  Surgeon: Leighton Ruff, MD;  Location: [facility];  Service: General;  Laterality: N/A;  . PORT-A-CATH REMOVAL Right 08/17/2014   Procedure: REMOVAL INTRAPERITONEAL CHEMO PORT;  Surgeon: Fanny Skates, MD;  Location: [place];  Service: General;  Laterality: Right;  . PORTACATH PLACEMENT Right 08/17/2014   Procedure:  PLACE NEW PORT A CATH;  Surgeon: Fanny Skates, MD;  Location: [place];  Service: General;  Laterality: Right;  . RECTAL BIOPSY N/A 09/09/2015   Procedure: BIOPSY OF RECTOVAGINAL MASS;  Surgeon: Leighton Ruff, MD;  Location: [place];  Service: General;  Laterality: N/A;  . TUBAL LIGATION  YRS AGO  . VAGINAL HYSTERECTOMY  1981   fibroids   Social History   Occupational History  . retire     [employer]   Social History Main Topics  . Smoking status: Former Smoker    Packs/day: 1.00    Years: 43.00    Types: Cigarettes    Quit date: 05/24/2016  . Smokeless tobacco: Never Used  . Alcohol use No  . Drug use: No  . Sexual activity: Not Currently    Birth control/ protection: Surgical

## 2016-11-23 NOTE — Progress Notes (Signed)
Abigail Wagner presented for Portacath access and flush. Proper placement of portacath confirmed by CXR. Portacath located right chest wall accessed with  H 20 needle. Good blood return present. Portacath flushed with 41ml NS and 500U/54ml Heparin and needle removed intact. Procedure without incident. Patient tolerated procedure well.

## 2016-12-14 ENCOUNTER — Telehealth: Payer: Self-pay | Admitting: Family Medicine

## 2016-12-23 ENCOUNTER — Other Ambulatory Visit (HOSPITAL_COMMUNITY): Payer: Self-pay | Admitting: Psychiatry

## 2016-12-23 DIAGNOSIS — F331 Major depressive disorder, recurrent, moderate: Secondary | ICD-10-CM

## 2016-12-23 DIAGNOSIS — F39 Unspecified mood [affective] disorder: Secondary | ICD-10-CM

## 2016-12-27 ENCOUNTER — Encounter (HOSPITAL_COMMUNITY): Payer: Self-pay | Admitting: Psychiatry

## 2016-12-27 ENCOUNTER — Ambulatory Visit (INDEPENDENT_AMBULATORY_CARE_PROVIDER_SITE_OTHER): Payer: Medicare Other | Admitting: Psychiatry

## 2016-12-27 VITALS — BP 144/77 | HR 84 | Ht 66.0 in | Wt 145.2 lb

## 2016-12-27 DIAGNOSIS — Z818 Family history of other mental and behavioral disorders: Secondary | ICD-10-CM

## 2016-12-27 DIAGNOSIS — F331 Major depressive disorder, recurrent, moderate: Secondary | ICD-10-CM

## 2016-12-27 DIAGNOSIS — Z79899 Other long term (current) drug therapy: Secondary | ICD-10-CM

## 2016-12-27 MED ORDER — LORAZEPAM 1 MG PO TABS: 1.0000 mg | ORAL_TABLET | Freq: Three times a day (TID) | ORAL | 3 refills | Status: DC | PRN

## 2016-12-27 MED ORDER — DIVALPROEX SODIUM ER 500 MG PO TB24: 500.0000 mg | ORAL_TABLET | Freq: Every day | ORAL | 2 refills | Status: DC

## 2016-12-27 MED ORDER — ESCITALOPRAM OXALATE 20 MG PO TABS: 20.0000 mg | ORAL_TABLET | Freq: Every morning | ORAL | 2 refills | Status: DC
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
## 2017-01-08 ENCOUNTER — Ambulatory Visit (INDEPENDENT_AMBULATORY_CARE_PROVIDER_SITE_OTHER): Payer: Medicare Other

## 2017-01-08 VITALS — BP 130/78 | HR 80 | Temp 97.8°F | Ht 66.0 in | Wt 146.0 lb

## 2017-01-08 DIAGNOSIS — Z0000 Encounter for general adult medical examination without abnormal findings: Secondary | ICD-10-CM

## 2017-01-08 NOTE — Patient Instructions (Addendum)
Abigail Wagner , Thank you for taking time to come for your Medicare Wellness Visit. I appreciate your ongoing commitment to your health goals. Please review the following plan we discussed and let me know if I can assist you in the future.   Screening recommendations/referrals: Colonoscopy: Up to date, continue follow up with Dr. Laural Golden Mammogram: Up to date, next due 05/2017 Bone Density: Up to date Recommended yearly ophthalmology/optometry visit for glaucoma screening and checkup Recommended yearly dental visit for hygiene and checkup  Vaccinations: Influenza vaccine: Up to date, next due 04/2017 Pneumococcal vaccine: Up to date, next due 10/2017 Tdap vaccine: Up to date, next due 09/2018 Shingles vaccine: Up to date  Advanced directives: Please bring a copy of your POA (Power of Friendly) and/or Living Will to your next appointment.  Next appointment: Follow up in 1 month with Dr. Meda Coffee for your complete physical. Follow up in 1 year for your annual wellness visit  Preventive Care 65 Years and Older, Female Preventive care refers to lifestyle choices and visits with your health care provider that can promote health and wellness. What does preventive care include?  A yearly physical exam. This is also called an annual well check.  Dental exams once or twice a year.  Routine eye exams. Ask your health care provider how often you should have your eyes checked.  Personal lifestyle choices, including:  Daily care of your teeth and gums.  Regular physical activity.  Eating a healthy diet.  Avoiding tobacco and drug use.  Limiting alcohol use.  Practicing safe sex.  Taking low-dose aspirin every day.  Taking vitamin and mineral supplements as recommended by your health care provider. What happens during an annual well check? The services and screenings done by your health care provider during your annual well check will depend on your age, overall health, lifestyle risk factors,  and family history of disease. Counseling  Your health care provider may ask you questions about your:  Alcohol use.  Tobacco use.  Drug use.  Emotional well-being.  Home and relationship well-being.  Sexual activity.  Eating habits.  History of falls.  Memory and ability to understand (cognition).  Work and work Statistician.  Reproductive health. Screening  You may have the following tests or measurements:  Height, weight, and BMI.  Blood pressure.  Lipid and cholesterol levels. These may be checked every 5 years, or more frequently if you are over 36 years old.  Skin check.  Lung cancer screening. You may have this screening every year starting at age 31 if you have a 30-pack-year history of smoking and currently smoke or have quit within the past 15 years.  Fecal occult blood test (FOBT) of the stool. You may have this test every year starting at age 21.  Flexible sigmoidoscopy or colonoscopy. You may have a sigmoidoscopy every 5 years or a colonoscopy every 10 years starting at age 47.  Hepatitis C blood test.  Hepatitis B blood test.  Sexually transmitted disease (STD) testing.  Diabetes screening. This is done by checking your blood sugar (glucose) after you have not eaten for a while (fasting). You may have this done every 1-3 years.  Bone density scan. This is done to screen for osteoporosis. You may have this done starting at age 47.  Mammogram. This may be done every 1-2 years. Talk to your health care provider about how often you should have regular mammograms. Talk with your health care provider about your test results, treatment options, and  if necessary, the need for more tests. Vaccines  Your health care provider may recommend certain vaccines, such as:  Influenza vaccine. This is recommended every year.  Tetanus, diphtheria, and acellular pertussis (Tdap, Td) vaccine. You may need a Td booster every 10 years.  Zoster vaccine. You may need  this after age 95.  Pneumococcal 13-valent conjugate (PCV13) vaccine. One dose is recommended after age 36.  Pneumococcal polysaccharide (PPSV23) vaccine. One dose is recommended after age 66. Talk to your health care provider about which screenings and vaccines you need and how often you need them. This information is not intended to replace advice given to you by your health care provider. Make sure you discuss any questions you have with your health care provider. Document Released: 10/08/2015 Document Revised: 05/31/2016 Document Reviewed: 07/13/2015 Elsevier Interactive Patient Education  2017 [place] Prevention in the Home Falls can cause injuries. They can happen to people of all ages. There are many things you can do to make your home safe and to help prevent falls. What can I do on the outside of my home?  Regularly fix the edges of walkways and driveways and fix any cracks.  Remove anything that might make you trip as you walk through a door, such as a raised step or threshold.  Trim any bushes or trees on the path to your home.  Use bright outdoor lighting.  Clear any walking paths of anything that might make someone trip, such as rocks or tools.  Regularly check to see if handrails are loose or broken. Make sure that both sides of any steps have handrails.  Any raised decks and porches should have guardrails on the edges.  Have any leaves, snow, or ice cleared regularly.  Use sand or salt on walking paths during winter.  Clean up any spills in your garage right away. This includes oil or grease spills. What can I do in the bathroom?  Use night lights.  Install grab bars by the toilet and in the tub and shower. Do not use towel bars as grab bars.  Use non-skid mats or decals in the tub or shower.  If you need to sit down in the shower, use a plastic, non-slip stool.  Keep the floor dry. Clean up any water that spills on the floor as soon as it  happens.  Remove soap buildup in the tub or shower regularly.  Attach bath mats securely with double-sided non-slip rug tape.  Do not have throw rugs and other things on the floor that can make you trip. What can I do in the bedroom?  Use night lights.  Make sure that you have a light by your bed that is easy to reach.  Do not use any sheets or blankets that are too big for your bed. They should not hang down onto the floor.  Have a firm chair that has side arms. You can use this for support while you get dressed.  Do not have throw rugs and other things on the floor that can make you trip. What can I do in the kitchen?  Clean up any spills right away.  Avoid walking on wet floors.  Keep items that you use a lot in easy-to-reach places.  If you need to reach something above you, use a strong step stool that has a grab bar.  Keep electrical cords out of the way.  Do not use floor polish or wax that makes floors slippery. If you  must use wax, use non-skid floor wax.  Do not have throw rugs and other things on the floor that can make you trip. What can I do with my stairs?  Do not leave any items on the stairs.  Make sure that there are handrails on both sides of the stairs and use them. Fix handrails that are broken or loose. Make sure that handrails are as long as the stairways.  Check any carpeting to make sure that it is firmly attached to the stairs. Fix any carpet that is loose or worn.  Avoid having throw rugs at the top or bottom of the stairs. If you do have throw rugs, attach them to the floor with carpet tape.  Make sure that you have a light switch at the top of the stairs and the bottom of the stairs. If you do not have them, ask someone to add them for you. What else can I do to help prevent falls?  Wear shoes that:  Do not have high heels.  Have rubber bottoms.  Are comfortable and fit you well.  Are closed at the toe. Do not wear sandals.  If you  use a stepladder:  Make sure that it is fully opened. Do not climb a closed stepladder.  Make sure that both sides of the stepladder are locked into place.  Ask someone to hold it for you, if possible.  Clearly mark and make sure that you can see:  Any grab bars or handrails.  First and last steps.  Where the edge of each step is.  Use tools that help you move around (mobility aids) if they are needed. These include:  Canes.  Walkers.  Scooters.  Crutches.  Turn on the lights when you go into a dark area. Replace any light bulbs as soon as they burn out.  Set up your furniture so you have a clear path. Avoid moving your furniture around.  If any of your floors are uneven, fix them.  If there are any pets around you, be aware of where they are.  Review your medicines with your doctor. Some medicines can make you feel dizzy. This can increase your chance of falling. Ask your doctor what other things that you can do to help prevent falls. This information is not intended to replace advice given to you by your health care provider. Make sure you discuss any questions you have with your health care provider. Document Released: 07/08/2009 Document Revised: 02/17/2016 Document Reviewed: 10/16/2014 Elsevier Interactive Patient Education  2017 [employer].

## 2017-01-08 NOTE — Progress Notes (Signed)
Subjective:   Abigail Wagner is a 66 y.o. female who presents for an Initial Medicare Annual Wellness Visit.  Review of Systems: N/A  Cardiac Risk Factors include: advanced age (>46men, >88 women);dyslipidemia;hypertension;sedentary lifestyle     Objective:    Today's Vitals   01/08/17 1333 01/08/17 1337  BP: 130/78   Pulse: 80   Temp: 97.8 F (36.6 C)   TempSrc: Oral   SpO2: 95%   Weight: 146 lb (66.2 kg)   Height: 5\' 6"  (1.676 m)   PainSc:  3    Body mass index is 23.57 kg/m.   Current Medications (verified) Outpatient Encounter Prescriptions as of 01/08/2017  Medication Sig  . Aspirin-Salicylamide-Caffeine (BC HEADACHE POWDER PO) Take 1 packet by mouth as needed.   Marland Kitchen b complex vitamins tablet Take 1 tablet by mouth daily.  . diclofenac (VOLTAREN) 75 MG EC tablet Take 1 tablet (75 mg total) by mouth 2 (two) times daily.  Marland Kitchen dicyclomine (BENTYL) 10 MG capsule 10 to 20 mg by mouth 4 times daily as needed  . divalproex (DEPAKOTE ER) 500 MG 24 hr tablet Take 1 tablet (500 mg total) by mouth daily.  Marland Kitchen escitalopram (LEXAPRO) 20 MG tablet Take 1 tablet (20 mg total) by mouth every morning.  Marland Kitchen KRILL OIL PO Take 350 mg by mouth daily.  . lansoprazole (PREVACID) 15 MG capsule Take 15 mg by mouth as needed.   Marland Kitchen LORazepam (ATIVAN) 1 MG tablet Take 1 tablet (1 mg total) by mouth 3 (three) times daily as needed for anxiety.  . Multiple Vitamin (MULTIVITAMIN) tablet Take 1 tablet by mouth daily.  . valACYclovir (VALTREX) 1000 MG tablet Take 1,000 mg by mouth daily as needed.    No facility-administered encounter medications on file as of 01/08/2017.     Allergies (verified) Patient has no known allergies.   History: Past Medical History:  Diagnosis Date  . Allergy   . Anal carcinoma [facility]) oncologist-  dr Whitney Muse ([facility])   dx 12/ 2016 [facility] --  completed therapy  03/ 2017  . Anemia   . Anxiety   . Blood transfusion without reported diagnosis   . Carcinoma of ovary,  stage 3 [facility]) oncologist-  dr gehrig/  dr Doreene Eland (cancer center in [place] w[facility])--  no recurrency   dx 09/ 2015  Stage IIIB  papillary ovarian carcinoma  s/p  omentectomy and BSO &  chemotherapy (completed 12-07-2014)  . Chemotherapy induced nausea and vomiting 10/23/2014  . Chronic kidney disease    stones  . DDD (degenerative disc disease), lumbosacral   . GERD (gastroesophageal reflux disease)   . Headache(784.0)   . History of acute respiratory failure    05-30-2009  drug overdose-- (intubated for 2 days)  and aspiration pneumonia  . History of adenomatous polyp of colon 10/07/2015   tubular adenoma high grade dysplasia  . History of herpes genitalis   . History of kidney stones   . History of ovarian cancer 08/03/2015  . History of suicide attempt    per documentation in epic  05-30-2009  overdose benaodiazepine  . HTN (hypertension)    takes Metoprolol daily  . Hyperlipidemia   . IBS (irritable bowel syndrome)    takes Bentyl daily  . Major depression   . Mood disorder ([place])   . OA (osteoarthritis)   . Prolapse of vaginal vault after hysterectomy 07/20/2015  . Rectovaginal fistula 08/05/2015  . Sigmoid diverticulosis   . Smokers' cough ([place])  Past Surgical History:  Procedure Laterality Date  . BREAST ENHANCEMENT SURGERY  1986  . [place]  . COLONOSCOPY N/A 10/07/2015   Procedure: COLONOSCOPY;  Surgeon: Rogene Houston, MD;  [facility];  Service: Endoscopy;  Laterality: N/A;  7:30  . EXPLORATORY LAPAROTOMY/ OMENTECTOMY/  BILATERAL SALPINGOOPHORECTOMY/  [facility] PLACEMENT  07-03-2014   [facility]  . KNEE ARTHROSCOPY Left 09-22-2004  . LAPAROSCOPY N/A 06/02/2014   Procedure: DIAGNOSTIC LAPAROSCOPY, OMENTAL BIOPSY, RIGHT OVARY BIOPSY, LYSIS OF ADHESIONS;  Surgeon: Fanny Skates, MD;  Location: [place];  Service: General;  Laterality: N/A;  . MUCOSAL ADVANCEMENT FLAP N/A 06/15/2016   Procedure: EXCISION RECTOVAGINAOL FISTULA WITH MUCOSAL  ADVANCEMENT FLAP;  Surgeon: Leighton Ruff, MD;  Location: [place];  Service: General;  Laterality: N/A;  . PLACEMENT OF SETON N/A 09/09/2015   Procedure: PLACEMENT OF SETON;  Surgeon: Leighton Ruff, MD;  Location: [place];  Service: General;  Laterality: N/A;  . PORT-A-CATH REMOVAL Right 08/17/2014   Procedure: REMOVAL INTRAPERITONEAL CHEMO PORT;  Surgeon: Fanny Skates, MD;  Location: [place];  Service: General;  Laterality: Right;  . PORTACATH PLACEMENT Right 08/17/2014   Procedure:  PLACE NEW PORT A CATH;  Surgeon: Fanny Skates, MD;  Location: [place];  Service: General;  Laterality: Right;  . RECTAL BIOPSY N/A 09/09/2015   Procedure: BIOPSY OF RECTOVAGINAL MASS;  Surgeon: Leighton Ruff, MD;  Location: [place];  Service: General;  Laterality: N/A;  . TUBAL LIGATION  YRS AGO  . VAGINAL HYSTERECTOMY  1981   fibroids   Family History  Problem Relation Age of Onset  . Bipolar disorder Mother   . Anxiety disorder Mother   . Dementia Mother   . Depression Mother   . Mental illness Mother   . Vision loss Mother   . Alzheimer's disease Mother   . Alcohol abuse Paternal Uncle   . Bipolar disorder Maternal Grandmother   . Dementia Maternal Grandmother   . Alcohol abuse Paternal Uncle   . Stroke Brother   . Deep vein thrombosis Son   . Heart disease Father   . Hyperlipidemia Father   . Hypertension Father   . Stroke Father   . Vision loss Father   . Atrial fibrillation Sister   . Heart disease Brother   . Heart disease Brother   . ADD / ADHD Neg Hx   . Drug abuse Neg Hx   . OCD Neg Hx   . Paranoid behavior Neg Hx   . Schizophrenia Neg Hx   . Seizures Neg Hx   . Sexual abuse Neg Hx   . Physical abuse Neg Hx    Social History   Occupational History  . retire     [employer]   Social History Main Topics  . Smoking status: Former Smoker    Packs/day: 1.00    Years: 43.00    Types:  Cigarettes    Quit date: 05/24/2016  . Smokeless tobacco: Never Used  . Alcohol use No  . Drug use: No  . Sexual activity: Not Currently    Birth control/ protection: Surgical    Tobacco Counseling Counseling given: Not Answered   Activities of Daily Living In your present state of health, do you have any difficulty performing the following activities: 01/08/2017 11/07/2016  Hearing? N N  Vision? N N  Difficulty concentrating or making decisions? Y N  Walking or climbing stairs? Aggie [place]  Dressing or bathing? N N  Doing errands, shopping? N N  Preparing Food and eating ? N -  Using the Toilet? N -  In the past six months, have you accidently leaked urine? N -  Do you have problems with loss of bowel control? Y -  Managing your Medications? N -  Managing your Finances? N -  Housekeeping or managing your Housekeeping? Y -  Some recent data might be hidden    Immunizations and Health Maintenance Immunization History  Administered Date(s) Administered  . Influenza-Unspecified 10/04/2016  . Pneumococcal Conjugate-13 11/07/2016   Health Maintenance Due  Topic Date Due  . Hepatitis C Screening  [birth year]  . TETANUS/TDAP  12/01/1969    Patient Care Team: Raylene Everts, MD as PCP - General (Family Medicine) Rogene Houston, MD as Consulting Physician (Gastroenterology) [name], MD as Consulting Physician ([place]) Leighton Ruff, MD as Consulting Physician (General Surgery) Baird Cancer, PA-C as Physician Assistant (Oncology) Marybelle Killings, MD as Consulting Physician (Orthopedic Surgery)  Indicate any recent Medical Services you may have received from other than [facility] providers in the past year (date may be approximate).     Assessment:   This is a routine wellness examination for Bronnie.  Hearing/Vision screen  Visual Acuity Screening   Right eye Left eye Both eyes  Without correction: 20/20 20/20 20/20   With correction:       Dietary issues and  exercise activities discussed: Current Exercise Habits: The patient does not participate in regular exercise at present, Exercise limited by: None identified  Goals    . Exercise 3x per week (30 min per time)          Recommend starting a routine exercise program at least 3 days a week for 30-45 minutes at a time as tolerated.        Depression Screen PHQ 2/9 Scores 01/08/2017 11/07/2016  PHQ - 2 Score 0 0    Fall Risk Fall Risk  01/08/2017 11/07/2016  Falls in the past year? No No    Cognitive Function: Normal by direct observation   Screening Tests Health Maintenance  Topic Date Due  . Hepatitis C Screening  03/20/1951  . TETANUS/TDAP  12/01/1969  . PNA vac Low Risk Adult (2 of 2 - PPSV23) 11/07/2017  . MAMMOGRAM  06/09/2018  . COLONOSCOPY  10/06/2025  . DEXA SCAN  Completed      Plan:    I have personally reviewed and addressed the Medicare Annual Wellness questionnaire and have noted the following in the patient's chart:  A. Medical and social history B. Use of alcohol, tobacco or illicit drugs  C. Current medications and supplements D. Functional ability and status E.  Nutritional status F.  Physical activity G. Advance directives H. List of other physicians I.  Hospitalizations, surgeries, and ER visits in previous 12 months J.  [place] to include cognitive, depression, and falls L. Referrals and appointments - none  In addition, I have reviewed and discussed with patient certain preventive protocols, quality metrics, and best practice recommendations. A written personalized care plan for preventive services as well as general preventive health recommendations were provided to patient.  Signed,   Stormy Fabian, LPN Lead Nurse Health Advisor

## 2017-01-10 ENCOUNTER — Encounter (HOSPITAL_BASED_OUTPATIENT_CLINIC_OR_DEPARTMENT_OTHER): Payer: Medicare Other

## 2017-01-10 ENCOUNTER — Encounter (HOSPITAL_COMMUNITY): Payer: Self-pay

## 2017-01-10 ENCOUNTER — Encounter (HOSPITAL_COMMUNITY): Payer: Self-pay | Admitting: Oncology

## 2017-01-10 ENCOUNTER — Encounter (HOSPITAL_COMMUNITY): Payer: Medicare Other | Attending: Oncology | Admitting: Oncology

## 2017-01-10 VITALS — BP 123/63 | HR 88 | Temp 97.8°F | Resp 18 | Ht 66.0 in | Wt 146.8 lb

## 2017-01-10 VITALS — BP 123/63 | HR 88 | Temp 97.8°F | Resp 18 | Wt 146.8 lb

## 2017-01-10 DIAGNOSIS — Z8543 Personal history of malignant neoplasm of ovary: Secondary | ICD-10-CM

## 2017-01-10 DIAGNOSIS — D649 Anemia, unspecified: Secondary | ICD-10-CM | POA: Diagnosis not present

## 2017-01-10 DIAGNOSIS — C210 Malignant neoplasm of anus, unspecified: Secondary | ICD-10-CM

## 2017-01-10 DIAGNOSIS — E559 Vitamin D deficiency, unspecified: Secondary | ICD-10-CM

## 2017-01-10 DIAGNOSIS — C569 Malignant neoplasm of unspecified ovary: Secondary | ICD-10-CM | POA: Diagnosis present

## 2017-01-10 DIAGNOSIS — Z95828 Presence of other vascular implants and grafts: Secondary | ICD-10-CM

## 2017-01-10 DIAGNOSIS — Z85048 Personal history of other malignant neoplasm of rectum, rectosigmoid junction, and anus: Secondary | ICD-10-CM | POA: Diagnosis not present

## 2017-01-10 DIAGNOSIS — R300 Dysuria: Secondary | ICD-10-CM

## 2017-01-10 DIAGNOSIS — C562 Malignant neoplasm of left ovary: Secondary | ICD-10-CM

## 2017-01-10 LAB — LIPID PANEL
Cholesterol: 238 mg/dL — ABNORMAL HIGH (ref 0–200)
HDL: 46 mg/dL (ref >40)
LDL CALC: 164 mg/dL — AB (ref 0–99)
Total CHOL/HDL Ratio: 5.2 RATIO
Triglycerides: 139 mg/dL (ref <150)
VLDL: 28 mg/dL (ref 0–40)

## 2017-01-10 LAB — COMPREHENSIVE METABOLIC PANEL
ALBUMIN: 3.6 g/dL (ref 3.5–5.0)
ALT: 19 U/L (ref 14–54)
ANION GAP: 7 (ref 5–15)
AST: 23 U/L (ref 15–41)
Alkaline Phosphatase: 54 U/L (ref 38–126)
BUN: 19 mg/dL (ref 6–20)
CO2: 27 mmol/L (ref 22–32)
Calcium: 8.8 mg/dL — ABNORMAL LOW (ref 8.9–10.3)
Chloride: 102 mmol/L (ref 101–111)
Creatinine, Ser: 0.51 mg/dL (ref 0.44–1.00)
GFR calc non Af Amer: >60 mL/min (ref >60)
GLUCOSE: 90 mg/dL (ref 65–99)
POTASSIUM: 3.9 mmol/L (ref 3.5–5.1)
Sodium: 136 mmol/L (ref 135–145)
TOTAL PROTEIN: 6.3 g/dL — AB (ref 6.5–8.1)
Total Bilirubin: 0.2 mg/dL — ABNORMAL LOW (ref 0.3–1.2)

## 2017-01-10 LAB — URINALYSIS, ROUTINE W REFLEX MICROSCOPIC
BILIRUBIN URINE: NEGATIVE
GLUCOSE, UA: NEGATIVE mg/dL
HGB URINE DIPSTICK: NEGATIVE
KETONES UR: NEGATIVE mg/dL
LEUKOCYTES UA: NEGATIVE
NITRITE: NEGATIVE
Protein, ur: NEGATIVE mg/dL
Specific Gravity, Urine: 1.011 (ref 1.005–1.030)
pH: 6 (ref 5.0–8.0)

## 2017-01-10 LAB — CBC WITH DIFFERENTIAL/PLATELET
BASOS PCT: 1 %
Basophils Absolute: 0 10*3/uL (ref 0.0–0.1)
EOS ABS: 0.3 10*3/uL (ref 0.0–0.7)
Eosinophils Relative: 6 %
HCT: 30.9 % — ABNORMAL LOW (ref 36.0–46.0)
Hemoglobin: 10 g/dL — ABNORMAL LOW (ref 12.0–15.0)
Lymphocytes Relative: 28 %
Lymphs Abs: 1.1 10*3/uL (ref 0.7–4.0)
MCH: 29.2 pg (ref 26.0–34.0)
MCHC: 32.4 g/dL (ref 30.0–36.0)
MCV: 90.1 fL (ref 78.0–100.0)
MONO ABS: 0.3 10*3/uL (ref 0.1–1.0)
MONOS PCT: 7 %
Neutro Abs: 2.4 10*3/uL (ref 1.7–7.7)
Neutrophils Relative %: 58 %
Platelets: 263 10*3/uL (ref 150–400)
RBC: 3.43 MIL/uL — ABNORMAL LOW (ref 3.87–5.11)
RDW: 15.4 % (ref 11.5–15.5)
WBC: 4.1 10*3/uL (ref 4.0–10.5)

## 2017-01-10 MED ORDER — SODIUM CHLORIDE 0.9% FLUSH
10.0000 mL | INTRAVENOUS | Status: DC | PRN
Start: 2017-01-10 — End: 2017-01-10
  Administered 2017-01-10: 10 mL via INTRAVENOUS
  Filled 2017-01-10: qty 10

## 2017-01-10 MED ORDER — HEPARIN SOD (PORK) LOCK FLUSH 100 UNIT/ML IV SOLN
500.0000 [IU] | Freq: Once | INTRAVENOUS | Status: AC
  Administered 2017-01-10: 500 [IU] via INTRAVENOUS

## 2017-01-10 MED ORDER — HEPARIN SOD (PORK) LOCK FLUSH 100 UNIT/ML IV SOLN
INTRAVENOUS | Status: AC
  Filled 2017-01-10: qty 5

## 2017-01-10 NOTE — Assessment & Plan Note (Addendum)
Stage III ovarian cancer, S/P debulking surgery on 07/03/2014 by Dr. Alycia Rossetti, followed by 6 cycles of Carboplatin/Paclitaxel with last treatment on 12/07/2014.  Oncology history developed.  Labs today: CBC diff, CMET, CA 125.  I personally reviewed and went over laboratory results with the patient.  The results are noted within this dictation.  Labs in 4 months: CBC diff, CMET, CA 125.  Sexual health was broached.  She denies being sexually active.  She denies any significant vaginal dryness or sexual health issues at this time.  Return in 4 months for follow-up.

## 2017-01-10 NOTE — Patient Instructions (Addendum)
[place] at [facility] Discharge Instructions  RECOMMENDATIONS MADE BY THE CONSULTANT AND ANY TEST RESULTS WILL BE SENT TO YOUR REFERRING PHYSICIAN.  You were seen today by Kirby Crigler PA-C. Port flush and labs today. Port flush every 2 months. Return in 4 months for labs and follow up  Thank you for choosing [place] at [facility] to provide your oncology and hematology care.  To afford each patient quality time with our provider, please arrive at least 15 minutes before your scheduled appointment time.    If you have a lab appointment with the [place] please come in thru the  Main Entrance and check in at the main information desk  You need to re-schedule your appointment should you arrive 10 or more minutes late.  We strive to give you quality time with our providers, and arriving late affects you and other patients whose appointments are after yours.  Also, if you no show three or more times for appointments you may be dismissed from the clinic at the providers discretion.     Again, thank you for choosing [facility]  Our hope is that these requests will decrease the amount of time that you wait before being seen by our physicians.       _____________________________________________________________  Should you have questions after your visit to [facility], please contact our office at [phone] between the hours of 8:30 a.m. and 4:30 p.m.  Voicemails left after 4:30 p.m. will not be returned until the following business day.  For prescription refill requests, have your pharmacy contact our office.       Resources For Cancer Patients and their Caregivers ? American Cancer Society: Can assist with transportation, wigs, general needs, runs Look Good Feel Better.        559-606-2443 ? Cancer Care: Provides financial assistance, online support groups, medication/co-pay assistance.   1-800-813-HOPE 9307239938) ? [place] Assists [place] cancer patients and their families through emotional , educational and financial support.  901-562-0407 ? [place] DSS Where to apply for food stamps, Medicaid and utility assistance. 2046779506 ? [facility]: Transportation to medical appointments. 209-139-5797 ? Social Security Administration: May apply for disability if have a Stage IV cancer. (631)216-6957 229-579-7843 ? [employer], Disability and Transit Services: Assists with nutrition, care and transit needs. [place] Support Programs: @10RELATIVEDAYS @ > Cancer Support Group  2nd Tuesday of the month 1pm-2pm, Journey Room  > Creative Journey  3rd Tuesday of the month 1130am-1pm, Journey Room  > Look Good Feel Better  1st Wednesday of the month 10am-12 noon, Journey Room (Call [facility] to register [phone])

## 2017-01-10 NOTE — Progress Notes (Signed)
Abigail Everts, MD [phone] [address] [place] [address]  Anal cancer [facility]) - Plan: CBC with Differential, Comprehensive metabolic panel  History of ovarian cancer - Plan: CBC with Differential, Comprehensive metabolic panel, CA 124  Normocytic normochromic anemia - Plan: CBC, Pathologist smear review, Vitamin B12, Folate, Iron and TIBC, Ferritin, Erythropoietin, Haptoglobin, Lactate dehydrogenase, Reticulocytes, Sedimentation rate, C-reactive protein  Port-a-cath in place  CURRENT THERAPY: Surveillance per NCCN guidelines  INTERVAL HISTORY: Abigail Wagner 66 y.o. female returns for followup of Stage II anal cancer, S/P concomitant chemoradiation consisting of 5FU and Mitomycin C with curative intent, finishing therapy on 12/06/2015 AND Stage III ovarian cancer, S/P debulking surgery on 07/03/2014 by Dr. Alycia Rossetti, followed by 6 cycles of Carboplatin/Paclitaxel with last treatment on 12/07/2014.    History of ovarian cancer   06/02/2014 Procedure    Diagnostic laparoscopy, lysis of adhesions, biopsy of omental nodules, biopsy right ovary by Dr Dalbert Batman      06/04/2014 Pathology Results    Diagnosis 1. Omentum, biopsy, Anterior peritoneal & omental nodule - SEROUS CARCINOMA WITH ASSOCIATED ABUNDANT PSAMMOMA BODIES AND DESMOPLASTIC STROMAL REACTION CONSISTENT WITH INVASIVE IMPLANTS. - SEE COMMENT. 2. Omentum, biopsy, Omental nodule - SEROUS CARCINOMA WITH ASSOCIATED ABUNDANT PSAMMOMA BODIES AND DESMOPLASTIC STROMAL REACTION CONSISTENT WITH INVASIVE IMPLANTS. - SEE COMMENT. 3. Ovary, biopsy/wedge resection, Right ovary - SMALL FRAGMENTS OF ATYPICAL PAPILLARY PROLIFERATION WITH PSAMMOMA BODIES CONSISTENT WITH AT LEAST SEROUS BORDERLINE TUMOR.      07/03/2014 Procedure    Exploratory laparotomy, omentectomy, Bilateral salpingo-oophorectomy, inptraperitoneal PORT placement by Dr. Alycia Rossetti          07/24/2014 - 12/07/2014 Chemotherapy    Carboplatin/Paclitaxel x 6  cycles       08/17/2014 Procedure    Insertion of 8 French power port clear view tunneled venous vascular access device next line use of fluoroscopy for guidance and positioning Use of ultrasound for venipuncture Removal of intraperitoneal chemotherapy port By Dr. Dion Body cancer [facility])   09/09/2015 Procedure    Rectovaginal septal mass biopsy by Dr. Marcello Moores      09/10/2015 Pathology Results    Soft tissue mass, biopsy, rectovaginal septal mass - SQUAMOUS CELL CARCINOMA.      10/15/2015 PET scan    1. Focal hypermetabolism with associated ill-defined soft tissue fullness at the junction of the anterior anal wall and posterior inferior vaginal wall, in keeping with the provided history of a primary anal carcinoma. 2. No hypermetabolic locoregional or distant metastatic disease. 3. Status post hysterectomy, with no abnormal findings at the vaginal cuff. No evidence of hypermetabolic peritoneal tumor. 4. Tiny nonobstructing bilateral renal stones.      10/25/2015 - 12/06/2015 Radiation Therapy    [place]      10/25/2015 - 12/06/2015 Chemotherapy    Mitomycin C and 5FU, by Dr. Jacquiline Doe       06/15/2016 Procedure    EXCISION RECTOVAGINAOL FISTULA WITH MUCOSAL ADVANCEMENT FLAP by Dr. Marcello Moores      06/18/2016 Pathology Results    Fistula, rectovaginal SQUAMOUS, COLUMNAR JUNCTION MUCOSA AND SUBCUTANOUS SOFT TISSUE WITH FISTULA NO EVIDENCE OF MALIGNANCY      09/20/2016 Imaging    CT CAP- 1. No acute findings and no evidence for recurrent tumor or metastatic disease. 2. Aortic atherosclerosis and coronary artery calcification       She saw Dr. Lorin Mercy (Ortho) and diagnosed with a left hip trochanteric bursitis treated with steroid  injection on 11/23/2016.  She reports improvement in her discomfort lasting for only a week.  She thinks her discomfort is secondary to lack of exercise.  She is going to try to embark on a light exercise program which would certainly be  beneficial.  She did see Dr. Leighton Ruff in February 2018.  She notes that she received a good report from surgery standpoint.  She continues to follow with Dr. Marcello Moores for DRE and anoscopy follow-up.   She has follow-up in May 2018  She denies any change in her bowel habits.  She denies any blood in her stool, pencil thin stool, or painful bowel movements.  She denies any change in bowel frequency.  She denies any vaginal or pelvic pain.  She denies any vaginal bleeding.  We broached the topic of sexual health.  She reports that she is not sexually active and has no concerns about her sexual health at this time.  Review of Systems  Constitutional: Negative.  Negative for chills, fever and weight loss.  HENT: Negative.   Eyes: Negative.   Respiratory: Negative.  Negative for cough.   Cardiovascular: Negative.  Negative for chest pain.  Gastrointestinal: Negative.  Negative for blood in stool, constipation, diarrhea, melena, nausea and vomiting.  Genitourinary: Negative.   Musculoskeletal: Positive for joint pain (left hip).  Skin: Negative.   Neurological: Negative.  Negative for weakness.  Endo/Heme/Allergies: Negative.   Psychiatric/Behavioral: Negative.     Past Medical History:  Diagnosis Date  . Allergy   . Anal carcinoma [facility]) oncologist-  dr Whitney Muse ([facility])   dx 12/ 2016 [facility] --  completed therapy  03/ 2017  . Anemia   . Anxiety   . Blood transfusion without reported diagnosis   . Carcinoma of ovary, stage 3 [facility]) oncologist-  dr gehrig/  dr Doreene Eland (cancer center in [place])--  no recurrency   dx 09/ 2015  Stage IIIB  papillary ovarian carcinoma  s/p  omentectomy and BSO &  chemotherapy (completed 12-07-2014)  . Chemotherapy induced nausea and vomiting 10/23/2014  . Chronic kidney disease    stones  . DDD (degenerative disc disease), lumbosacral   . GERD (gastroesophageal reflux disease)   . Headache(784.0)   . History of acute respiratory failure      05-30-2009  drug overdose-- (intubated for 2 days)  and aspiration pneumonia  . History of adenomatous polyp of colon 10/07/2015   tubular adenoma high grade dysplasia  . History of herpes genitalis   . History of kidney stones   . History of ovarian cancer 08/03/2015  . History of suicide attempt    per documentation in epic  05-30-2009  overdose benaodiazepine  . HTN (hypertension)    takes Metoprolol daily  . Hyperlipidemia   . IBS (irritable bowel syndrome)    takes Bentyl daily  . Major depression   . Mood disorder ([place])   . OA (osteoarthritis)   . Prolapse of vaginal vault after hysterectomy 07/20/2015  . Rectovaginal fistula 08/05/2015  . Sigmoid diverticulosis   . Smokers' cough [facility])     Past Surgical History:  Procedure Laterality Date  . BREAST ENHANCEMENT SURGERY  1986  . [place]  . COLONOSCOPY N/A 10/07/2015   Procedure: COLONOSCOPY;  Surgeon: Rogene Houston, MD;  [facility];  Service: Endoscopy;  Laterality: N/A;  7:30  . EXPLORATORY LAPAROTOMY/ OMENTECTOMY/  BILATERAL SALPINGOOPHORECTOMY/  [facility] PLACEMENT  07-03-2014   [facility]  .  KNEE ARTHROSCOPY Left 09-22-2004  . LAPAROSCOPY N/A 06/02/2014   Procedure: DIAGNOSTIC LAPAROSCOPY, OMENTAL BIOPSY, RIGHT OVARY BIOPSY, LYSIS OF ADHESIONS;  Surgeon: Claud Kelp, MD;  Location: [facility] OR;  Service: General;  Laterality: N/A;  . MUCOSAL ADVANCEMENT FLAP N/A 06/15/2016   Procedure: EXCISION RECTOVAGINAOL FISTULA WITH MUCOSAL ADVANCEMENT FLAP;  Surgeon: Romie Levee, MD;  Location: [facility];  Service: General;  Laterality: N/A;  . PLACEMENT OF SETON N/A 09/09/2015   Procedure: PLACEMENT OF SETON;  Surgeon: Romie Levee, MD;  Location: [facility];  Service: General;  Laterality: N/A;  . PORT-A-CATH REMOVAL Right 08/17/2014   Procedure: REMOVAL INTRAPERITONEAL CHEMO PORT;  Surgeon: Claud Kelp, MD;  Location: [facility];  Service: General;   Laterality: Right;  . PORTACATH PLACEMENT Right 08/17/2014   Procedure:  PLACE NEW PORT A CATH;  Surgeon: Claud Kelp, MD;  Location: [facility];  Service: General;  Laterality: Right;  . RECTAL BIOPSY N/A 09/09/2015   Procedure: BIOPSY OF RECTOVAGINAL MASS;  Surgeon: Romie Levee, MD;  Location: [facility];  Service: General;  Laterality: N/A;  . TUBAL LIGATION  YRS AGO  . VAGINAL HYSTERECTOMY  1981   fibroids    Family History  Problem Relation Age of Onset  . Bipolar disorder Mother   . Anxiety disorder Mother   . Dementia Mother   . Depression Mother   . Mental illness Mother   . Vision loss Mother   . Alzheimer's disease Mother   . Alcohol abuse Paternal Uncle   . Bipolar disorder Maternal Grandmother   . Dementia Maternal Grandmother   . Alcohol abuse Paternal Uncle   . Stroke Brother   . Deep vein thrombosis Son   . Heart disease Father   . Hyperlipidemia Father   . Hypertension Father   . Stroke Father   . Vision loss Father   . Atrial fibrillation Sister   . Heart disease Brother   . Heart disease Brother   . ADD / ADHD Neg Hx   . Drug abuse Neg Hx   . OCD Neg Hx   . Paranoid behavior Neg Hx   . Schizophrenia Neg Hx   . Seizures Neg Hx   . Sexual abuse Neg Hx   . Physical abuse Neg Hx     Social History   Social History  . Marital status: Married    Spouse name: Aurther Loft  . Number of children: 3  . Years of education: 12   Occupational History  . retire     [employer]   Social History Main Topics  . Smoking status: Former Smoker    Packs/day: 1.00    Years: 43.00    Types: Cigarettes    Quit date: 05/24/2016  . Smokeless tobacco: Never Used  . Alcohol use No  . Drug use: No  . Sexual activity: Not Currently    Birth control/ protection: Surgical   Other Topics Concern  . None   Social History Narrative   Lives at home with terry   Retired [employer]        PHYSICAL EXAMINATION  ECOG PERFORMANCE  STATUS: 0 - Asymptomatic  Vitals:   01/10/17 1624  BP: 123/63  Pulse: 88  Resp: 18  Temp: 97.8 F (36.6 C)    GENERAL:alert, no distress, well nourished, well developed, comfortable, cooperative, smiling and unaccompanied SKIN: skin color, texture, turgor are normal, positive for: two 0.5-1 cm SKs (nonerythematous, hyperpigmented lesions,  raised, and  on back, one left of midline, the other right of midline HEAD: Normocephalic, No masses, lesions, tenderness or abnormalities EYES: normal, EOMI, Conjunctiva are pink and non-injected EARS: External ears normal OROPHARYNX:lips, buccal mucosa, and tongue normal and mucous membranes are moist  NECK: supple, trachea midline LYMPH:  no palpable lymphadenopathy BREAST:not examined LUNGS: clear to auscultation and percussion CHEST: Right sided port a cath in place HEART: regular rate & rhythm, no murmurs and no gallops.  Normal S1 and S2 with physiologic splitting of S2 on inspiration. ABDOMEN:abdomen soft, non-tender and normal bowel sounds BACK: Back symmetric, no curvature. EXTREMITIES:less then 2 second capillary refill, no joint deformities, effusion, or inflammation, no skin discoloration, no cyanosis  NEURO: alert & oriented x 3 with fluent speech, no focal motor/sensory deficits, gait normal   LABORATORY DATA: CBC    Component Value Date/Time   WBC 4.1 01/10/2017 1502   RBC 3.43 (L) 01/10/2017 1502   HGB 10.0 (L) 01/10/2017 1502   HCT 30.9 (L) 01/10/2017 1502   PLT 263 01/10/2017 1502   MCV 90.1 01/10/2017 1502   MCH 29.2 01/10/2017 1502   MCHC 32.4 01/10/2017 1502   RDW 15.4 01/10/2017 1502   LYMPHSABS 1.1 01/10/2017 1502   MONOABS 0.3 01/10/2017 1502   EOSABS 0.3 01/10/2017 1502   BASOSABS 0.0 01/10/2017 1502      Chemistry      Component Value Date/Time   NA 136 01/10/2017 1502   K 3.9 01/10/2017 1502   CL 102 01/10/2017 1502   CO2 27 01/10/2017 1502   BUN 19 01/10/2017 1502   CREATININE 0.51 01/10/2017 1502    CREATININE 0.54 04/15/2014 1027      Component Value Date/Time   CALCIUM 8.8 (L) 01/10/2017 1502   ALKPHOS 54 01/10/2017 1502   AST 23 01/10/2017 1502   ALT 19 01/10/2017 1502   BILITOT 0.2 (L) 01/10/2017 1502        PENDING LABS:   RADIOGRAPHIC STUDIES:  No results found.   PATHOLOGY:    ASSESSMENT AND PLAN:  Anal cancer (HCC) Stage II (T2N0M0) anal cancer, S/P concomitant chemoradiation consisting of 5FU and Mitomycin C with curative intent, finishing therapy on 12/06/2015.  Oncology history is up to date.  Labs today: CBC diff, CMET.  I personally reviewed and went over laboratory results with the patient.  The results are noted within this dictation.  She is up to date on mammogram.  She is due in Sept 2018.  I personally reviewed and went over radiographic studies with the patient.  The results are noted within this dictation.    She has follow-up with Dr. Romie Levee in May 2018 for her regular follow-up of anal cancer and DRE.  Since she did not have a T3 or T4 lesion and she did not have inguinal node involvement, she does not meet criteria for annual imaging surveillance according to NCCN guidelines.  Labs in 4 months: CBC diff, CMET.  Problem list reviewed with patient and edited accordingly.  Medications are reviewed with the patient and edited accordingly.  Return in 4 months for labs and follow-up appointment.  More than 50% of the time spent with the patient was utilized for counseling and coordination of care.  Addendum: Her labs returned and she is anemic, more so than typical.  I will ask nursing to request she return for peripheral work-up for anemia.  History of ovarian cancer Stage III ovarian cancer, S/P debulking surgery on 07/03/2014 by Dr. Duard Brady,  followed by 6 cycles of Carboplatin/Paclitaxel with last treatment on 12/07/2014.  Oncology history developed.  Labs today: CBC diff, CMET, CA 125.  I personally reviewed and went over  laboratory results with the patient.  The results are noted within this dictation.  Labs in 4 months: CBC diff, CMET, CA 125.  Sexual health was broached.  She denies being sexually active.  She denies any significant vaginal dryness or sexual health issues at this time.  Return in 4 months for follow-up.  Normocytic normochromic anemia Normocytic normochromic anemia, below baseline.  She will return for additional labs: CBC, CRP, ESR, anemia panel, retic count, LDH, haptoglobin, EPO, path smear review.  Port-a-cath in place Port-a-cath in place.  Port flushes every 8 weeks   ORDERS PLACED FOR THIS ENCOUNTER: Orders Placed This Encounter  Procedures  . CBC with Differential  . Comprehensive metabolic panel  . CA 125  . CBC  . Pathologist smear review  . Vitamin B12  . Folate  . Iron and TIBC  . Ferritin  . Erythropoietin  . Haptoglobin  . Lactate dehydrogenase  . Reticulocytes  . Sedimentation rate  . C-reactive protein    MEDICATIONS PRESCRIBED THIS ENCOUNTER: No orders of the defined types were placed in this encounter.   THERAPY PLAN:  NCCN guidelines recommends the following surveillance for anal cancer (1.2018):  A. DRE every 3-6 months for 5 years  B. Inguinal node palpation every 3-6 months for 5 years  C. Anoscopy every 6-12 months x 3 years  D. Chest/Abd/Pelvis CT with contrast annually for 3 years (if T3-T4 or inguinal node positive).  NCCN guidelines for surveillance of Stage I-IV Epithelial Ovarian Cancer/ Fallopian Tube Cancer/ Primary Peritoneal Cancer in those experiencing a complete response recommends the following (2.2018):  A. Vists every 2-4 months for first 2 years, then 3-6 months for 3 years, and then annually after 5 years.  B. Physical exam including pelvic exam  C. CA-125 or other tumor markers if initially elevated.  D. Refer for genetic risk evaluation if not previously performed.   E. CBC and chemistry profile as indicated.   F. CT  Chest/Abdomen/Pelvis, MRI, PET-CT, or PET (skull base to mid-thigh) as clinically indicated.  G. Chest X-ray as indicted.   H. Long-term wellness care.   All questions were answered. The patient knows to call the clinic with any problems, questions or concerns. We can certainly see the patient much sooner if necessary.  Patient and plan discussed with Dr. Twana First and she is in agreement with the aforementioned.   This note is electronically signed by: Doy Mince 01/10/2017 4:30 PM

## 2017-01-10 NOTE — Assessment & Plan Note (Signed)
Port-a-cath in place.  Port flushes every 8 weeks

## 2017-01-10 NOTE — Assessment & Plan Note (Addendum)
Stage II (T2N0M0) anal cancer, S/P concomitant chemoradiation consisting of 5FU and Mitomycin C with curative intent, finishing therapy on 12/06/2015.  Oncology history is up to date.  Labs today: CBC diff, CMET.  I personally reviewed and went over laboratory results with the patient.  The results are noted within this dictation.  She is up to date on mammogram.  She is due in Sept 2018.  I personally reviewed and went over radiographic studies with the patient.  The results are noted within this dictation.    She has follow-up with Dr. Leighton Ruff in May 0263 for her regular follow-up of anal cancer and DRE.  Since she did not have a T3 or T4 lesion and she did not have inguinal node involvement, she does not meet criteria for annual imaging surveillance according to NCCN guidelines.  Labs in 4 months: CBC diff, CMET.  Problem list reviewed with patient and edited accordingly.  Medications are reviewed with the patient and edited accordingly.  Return in 4 months for labs and follow-up appointment.  More than 50% of the time spent with the patient was utilized for counseling and coordination of care.  Addendum: Her labs returned and she is anemic, more so than typical.  I will ask nursing to request she return for peripheral work-up for anemia.

## 2017-01-10 NOTE — Assessment & Plan Note (Signed)
Normocytic normochromic anemia, below baseline.  She will return for additional labs: CBC, CRP, ESR, anemia panel, retic count, LDH, haptoglobin, EPO, path smear review. 

## 2017-01-10 NOTE — Progress Notes (Signed)
Abigail Wagner presented for Portacath access and flush.  Portacath located right chest wall accessed with  H 20 needle.  Good blood return present. Portacath flushed with 63ml NS and 500U/49ml Heparin and needle removed intact.  Procedure tolerated well and without incident.

## 2017-01-11 LAB — CA 125: CA 125: 9.8 U/mL (ref 0.0–38.1)

## 2017-01-11 LAB — VITAMIN D 25 HYDROXY (VIT D DEFICIENCY, FRACTURES): VIT D 25 HYDROXY: 47 ng/mL (ref 30.0–100.0)

## 2017-01-11 LAB — HEPATITIS PANEL, ACUTE
HCV Ab: <0.1 s/co ratio (ref 0.0–0.9)
HEP B S AG: NEGATIVE
Hep A IgM: NEGATIVE
Hep B C IgM: NEGATIVE

## 2017-01-17 ENCOUNTER — Other Ambulatory Visit (HOSPITAL_COMMUNITY): Payer: Self-pay

## 2017-01-25 ENCOUNTER — Encounter (HOSPITAL_COMMUNITY): Payer: Medicare Other | Attending: Oncology

## 2017-01-25 ENCOUNTER — Encounter (HOSPITAL_COMMUNITY): Payer: Self-pay

## 2017-01-25 ENCOUNTER — Other Ambulatory Visit (HOSPITAL_COMMUNITY): Payer: Self-pay

## 2017-01-25 VITALS — BP 120/66 | HR 80 | Temp 97.5°F | Resp 18

## 2017-01-25 DIAGNOSIS — C569 Malignant neoplasm of unspecified ovary: Secondary | ICD-10-CM | POA: Insufficient documentation

## 2017-01-25 DIAGNOSIS — Z95828 Presence of other vascular implants and grafts: Secondary | ICD-10-CM

## 2017-01-25 DIAGNOSIS — D649 Anemia, unspecified: Secondary | ICD-10-CM

## 2017-01-25 DIAGNOSIS — C210 Malignant neoplasm of anus, unspecified: Secondary | ICD-10-CM | POA: Diagnosis not present

## 2017-01-25 LAB — RETICULOCYTES
RBC.: 3.81 MIL/uL — ABNORMAL LOW (ref 3.87–5.11)
RETIC COUNT ABSOLUTE: 83.8 10*3/uL (ref 19.0–186.0)
Retic Ct Pct: 2.2 % (ref 0.4–3.1)

## 2017-01-25 LAB — FOLATE: Folate: 75 ng/mL (ref >5.9)

## 2017-01-25 LAB — IRON AND TIBC
IRON: 52 ug/dL (ref 28–170)
Saturation Ratios: 14 % (ref 10.4–31.8)
TIBC: 371 ug/dL (ref 250–450)
UIBC: 319 ug/dL

## 2017-01-25 LAB — CBC
HCT: 34 % — ABNORMAL LOW (ref 36.0–46.0)
Hemoglobin: 11.3 g/dL — ABNORMAL LOW (ref 12.0–15.0)
MCH: 29.7 pg (ref 26.0–34.0)
MCHC: 33.2 g/dL (ref 30.0–36.0)
MCV: 89.2 fL (ref 78.0–100.0)
Platelets: 259 10*3/uL (ref 150–400)
RBC: 3.81 MIL/uL — AB (ref 3.87–5.11)
RDW: 16.2 % — ABNORMAL HIGH (ref 11.5–15.5)
WBC: 3.9 10*3/uL — AB (ref 4.0–10.5)

## 2017-01-25 LAB — SEDIMENTATION RATE: Sed Rate: 8 mm/hr (ref 0–22)

## 2017-01-25 LAB — VITAMIN B12: VITAMIN B 12: 619 pg/mL (ref 180–914)

## 2017-01-25 LAB — FERRITIN: Ferritin: 21 ng/mL (ref 11–307)

## 2017-01-25 LAB — LACTATE DEHYDROGENASE: LDH: 137 U/L (ref 98–192)

## 2017-01-25 LAB — C-REACTIVE PROTEIN: CRP: <0.8 mg/dL (ref <1.0)

## 2017-01-25 MED ORDER — SODIUM CHLORIDE 0.9% FLUSH
10.0000 mL | INTRAVENOUS | Status: DC | PRN
Start: 2017-01-25 — End: 2017-01-25
  Administered 2017-01-25: 10 mL via INTRAVENOUS
  Filled 2017-01-25: qty 10

## 2017-01-25 MED ORDER — HEPARIN SOD (PORK) LOCK FLUSH 100 UNIT/ML IV SOLN
500.0000 [IU] | Freq: Once | INTRAVENOUS | Status: AC
  Administered 2017-01-25: 500 [IU] via INTRAVENOUS
  Filled 2017-01-25: qty 5

## 2017-01-25 NOTE — Progress Notes (Signed)
Abigail Wagner presented for Portacath access and flush.  Portacath located right chest wall accessed with  H 20 needle.  Good blood return present. Portacath flushed with 20ml NS and 500U/5ml Heparin and needle removed intact.  Procedure tolerated well and without incident.  Discharged ambulatory.  

## 2017-01-25 NOTE — Patient Instructions (Signed)
[facility] Discharge Instructions  RECOMMENDATIONS MADE BY THE CONSULTANT AND ANY TEST RESULTS WILL BE SENT TO YOUR REFERRING PHYSICIAN.  Port flush with lab work today. Return as scheduled.  Thank you for choosing [facility] at [facility] to provide your oncology and hematology care.  To afford each patient quality time with our provider, please arrive at least 15 minutes before your scheduled appointment time.    If you have a lab appointment with the Cancer Center please come in thru the  Main Entrance and check in at the main information desk  You need to re-schedule your appointment should you arrive 10 or more minutes late.  We strive to give you quality time with our providers, and arriving late affects you and other patients whose appointments are after yours.  Also, if you no show three or more times for appointments you may be dismissed from the clinic at the providers discretion.     Again, thank you for choosing [facility].  Our hope is that these requests will decrease the amount of time that you wait before being seen by our physicians.       _____________________________________________________________  Should you have questions after your visit to [facility], please contact our office at [phone] between the hours of 8:30 a.m. and 4:30 p.m.  Voicemails left after 4:30 p.m. will not be returned until the following business day.  For prescription refill requests, have your pharmacy contact our office.       Resources For Cancer Patients and their Caregivers ? American Cancer Society: Can assist with transportation, wigs, general needs, runs Look Good Feel Better.        1-888-227-6333 ? Cancer Care: Provides financial assistance, online support groups, medication/co-pay assistance.  1-800-813-HOPE (4673) ? [facility] Assists [place] cancer patients  and their families through emotional , educational and financial support.  336-427-4357 ? [place] DSS Where to apply for food stamps, Medicaid and utility assistance. 336-342-1394 ? [facility]: Transportation to medical appointments. 336-347-2287 ? Social Security Administration: May apply for disability if have a Stage IV cancer. 336-342-7796 1-800-772-1213 ? [place] Aging, Disability and Transit Services: Assists with nutrition, care and transit needs. 336-349-2343  Cancer Center Support Programs: @10RELATIVEDAYS@ > Cancer Support Group  2nd Tuesday of the month 1pm-2pm, Journey Room  > Creative Journey  3rd Tuesday of the month 1130am-1pm, Journey Room  > Look Good Feel Better  1st Wednesday of the month 10am-12 noon, Journey Room (Call American Cancer Society to register 1-800-395-5775)   

## 2017-01-26 DIAGNOSIS — Z85048 Personal history of other malignant neoplasm of rectum, rectosigmoid junction, and anus: Secondary | ICD-10-CM | POA: Diagnosis not present

## 2017-01-26 LAB — PATHOLOGIST SMEAR REVIEW

## 2017-01-26 LAB — HAPTOGLOBIN: Haptoglobin: 180 mg/dL (ref 34–200)

## 2017-01-26 LAB — ERYTHROPOIETIN: Erythropoietin: 41.8 m[IU]/mL — ABNORMAL HIGH (ref 2.6–18.5)

## 2017-01-31 ENCOUNTER — Other Ambulatory Visit (HOSPITAL_COMMUNITY): Payer: Self-pay | Admitting: Oncology

## 2017-01-31 DIAGNOSIS — D649 Anemia, unspecified: Secondary | ICD-10-CM

## 2017-02-01 ENCOUNTER — Other Ambulatory Visit (HOSPITAL_COMMUNITY): Payer: Self-pay | Admitting: Emergency Medicine

## 2017-02-01 ENCOUNTER — Telehealth (HOSPITAL_COMMUNITY): Payer: Self-pay | Admitting: *Deleted

## 2017-02-01 MED ORDER — POLYSACCHAR IRON-FA-B12 150-1-25 MG-MG-MCG PO CAPS: 1.0000 | ORAL_CAPSULE | Freq: Every day | ORAL | 1 refills | Status: DC

## 2017-02-01 NOTE — Progress Notes (Signed)
ferrex forte sent to [facility] pharmacy

## 2017-02-07 ENCOUNTER — Ambulatory Visit (INDEPENDENT_AMBULATORY_CARE_PROVIDER_SITE_OTHER): Payer: Medicare Other | Admitting: Family Medicine

## 2017-02-07 ENCOUNTER — Encounter: Payer: Self-pay | Admitting: Family Medicine

## 2017-02-07 VITALS — BP 136/82 | HR 84 | Temp 98.6°F | Resp 16 | Ht 65.0 in | Wt 143.1 lb

## 2017-02-07 DIAGNOSIS — I10 Essential (primary) hypertension: Secondary | ICD-10-CM

## 2017-02-07 DIAGNOSIS — C210 Malignant neoplasm of anus, unspecified: Secondary | ICD-10-CM

## 2017-02-07 DIAGNOSIS — G8929 Other chronic pain: Secondary | ICD-10-CM

## 2017-02-07 DIAGNOSIS — E785 Hyperlipidemia, unspecified: Secondary | ICD-10-CM | POA: Diagnosis not present

## 2017-02-07 DIAGNOSIS — M545 Low back pain: Secondary | ICD-10-CM | POA: Diagnosis not present

## 2017-02-07 DIAGNOSIS — C569 Malignant neoplasm of unspecified ovary: Secondary | ICD-10-CM | POA: Diagnosis not present

## 2017-02-07 MED ORDER — DICYCLOMINE HCL 10 MG PO CAPS: ORAL_CAPSULE | ORAL | 1 refills | Status: DC

## 2017-02-07 MED ORDER — MELOXICAM 15 MG PO TABS: 15.0000 mg | ORAL_TABLET | Freq: Every day | ORAL | 3 refills | Status: DC

## 2017-02-07 NOTE — Patient Instructions (Signed)
See me yearly Low cholesterol diet Exercise when able Need to check a cholesterol lab test in 3-6 months I am changing the voltaren to mobic Take once a day with food Take tylenol arthritis in addition when needed  See me yearly Call sooner for problems

## 2017-02-07 NOTE — Progress Notes (Signed)
Chief Complaint  Patient presents with  . Annual Exam   Here for "check up" Is up to date with all immunizations and cancer screenings Eats well, but not really low cholesterol Her framingham risk is calculated at 13%. This is discussed with her.  Statin recommended.  Brothers both have CAD. No exercise.  She does not want to take.  Will re check lipids in 6 months/ She has arthritis pain.  Not well controlled on voltaren.  No side effects or GI pain.  Will switch to Mobic, and take it with food.  Also can suppelment with prn tylenol.   Patient Active Problem List   Diagnosis Date Noted  . Normocytic normochromic anemia 01/10/2017  . Hypertension 09/27/2016  . GERD (gastroesophageal reflux disease) 09/27/2016  . Back pain 09/27/2016  . Anxiety 09/27/2016  . Allergy 09/27/2016  . Radiation proctitis 12/23/2015  . Anal cancer ([place]) 11/19/2015  . History of ovarian cancer 08/03/2015  . Prolapse of vaginal vault after hysterectomy 07/20/2015  . Migraines 05/20/2015  . Port-a-cath in place 01/13/2015  . Epithelial ovarian cancer, FIGO stage IIIA ([place]) 07/16/2014  . Abdominal carcinomatosis ([place]) 06/18/2014  . Bipolar 1 disorder ([place]) 12/27/2011  . Kidney stones 12/27/2011  . IBS (irritable colon syndrome) 12/27/2011  . Depression 11/07/2011    Outpatient Encounter Prescriptions as of 02/07/2017  Medication Sig  . Aspirin-Salicylamide-Caffeine (BC HEADACHE POWDER PO) Take 1 packet by mouth as needed.   Marland Kitchen b complex vitamins tablet Take 1 tablet by mouth daily.  Marland Kitchen dicyclomine (BENTYL) 10 MG capsule 10 to 20 mg by mouth 4 times daily as needed  . divalproex (DEPAKOTE ER) 500 MG 24 hr tablet Take 1 tablet (500 mg total) by mouth daily.  Marland Kitchen escitalopram (LEXAPRO) 20 MG tablet Take 1 tablet (20 mg total) by mouth every morning.  Marland Kitchen KRILL OIL PO Take 350 mg by mouth daily.  . lansoprazole (PREVACID) 15 MG capsule Take 15 mg by mouth as needed.   Marland Kitchen LORazepam (ATIVAN) 1 MG tablet Take 1  tablet (1 mg total) by mouth 3 (three) times daily as needed for anxiety.  . Multiple Vitamin (MULTIVITAMIN) tablet Take 1 tablet by mouth daily.  . Polysacchar Iron-FA-B12 (FERREX 150 FORTE) 150-1-25 MG-MG-MCG CAPS Take 1 capsule by mouth daily.  . valACYclovir (VALTREX) 1000 MG tablet Take 1,000 mg by mouth daily as needed.   . [DISCONTINUED] diclofenac (VOLTAREN) 75 MG EC tablet Take 1 tablet (75 mg total) by mouth 2 (two) times daily.  . [DISCONTINUED] dicyclomine (BENTYL) 10 MG capsule 10 to 20 mg by mouth 4 times daily as needed  . meloxicam (MOBIC) 15 MG tablet Take 1 tablet (15 mg total) by mouth daily.  Marland Kitchen POLY-IRON 150 FORTE 150-25-1 MG-MCG-MG CAPS Take 1 tablet by mouth daily.   No facility-administered encounter medications on file as of 02/07/2017.     No Known Allergies  Review of Systems  Constitutional: Negative for activity change, appetite change and unexpected weight change.  HENT: Negative for congestion, dental problem, postnasal drip and rhinorrhea.   Eyes: Negative for redness and visual disturbance.  Respiratory: Negative for cough and shortness of breath.   Cardiovascular: Negative for chest pain, palpitations and leg swelling.  Gastrointestinal: Positive for diarrhea. Negative for abdominal pain and constipation.  Genitourinary: Negative for difficulty urinating, frequency and menstrual problem.  Musculoskeletal: Positive for arthralgias and back pain.  Neurological: Negative for dizziness and headaches.  Psychiatric/Behavioral: Negative for dysphoric mood and sleep disturbance. The  patient is not nervous/anxious.        Sees psychiatry    BP 136/82 (BP Location: Right Arm, Patient Position: Sitting, Cuff Size: Normal)   Pulse 84   Temp 98.6 F (37 C) (Temporal)   Resp 16   Ht 5\' 5"  (1.651 m)   Wt 143 lb 1.9 oz (64.9 kg)   SpO2 97%   BMI 23.82 kg/m   Physical Exam  Constitutional: She is oriented to person, place, and time. She appears well-developed  and well-nourished.  HENT:  Head: Normocephalic and atraumatic.  Right Ear: External ear normal.  Left Ear: External ear normal.  Mouth/Throat: Oropharynx is clear and moist.  Eyes: Conjunctivae are normal. Pupils are equal, round, and reactive to light.  Neck: Normal range of motion. Neck supple. No thyromegaly present.  Cardiovascular: Normal rate, regular rhythm and normal heart sounds.   Pulmonary/Chest: Effort normal. No respiratory distress. She has wheezes.  Few scattered posterior wheeze  Abdominal: Soft. Bowel sounds are normal. There is no tenderness.  Musculoskeletal: Normal range of motion. She exhibits no edema.  Lymphadenopathy:    She has no cervical adenopathy.  Neurological: She is alert and oriented to person, place, and time.  Gait normal  Skin: Skin is warm and dry.  Reflexes dull  Psychiatric: She has a normal mood and affect. Her behavior is normal. Thought content normal.  Nursing note and vitals reviewed.   ASSESSMENT/PLAN:  1. Chronic bilateral low back pain without sciatica mobic/acetaminophen 2. Anal cancer ([place])  3. Epithelial ovarian cancer, FIGO stage IIIA ([place])  4. Essential hypertension No longer on medicine 5. hyperlipidemia Diet, exercise, fish oil, red yeast rice discussed. Patient Instructions  See me yearly Low cholesterol diet Exercise when able Need to check a cholesterol lab test in 3-6 months I am changing the voltaren to mobic Take once a day with food Take tylenol arthritis in addition when needed  See me yearly Call sooner for problems     Raylene Everts, MD

## 2017-03-06 ENCOUNTER — Other Ambulatory Visit (HOSPITAL_COMMUNITY): Payer: Self-pay

## 2017-03-06 MED ORDER — POLYSACCHAR IRON-FA-B12 150-1-25 MG-MG-MCG PO CAPS: 1.0000 | ORAL_CAPSULE | Freq: Every day | ORAL | 1 refills | Status: DC

## 2017-03-06 NOTE — Telephone Encounter (Signed)
Received refill request from patients pharmacy for 90 day supply of ferrex forte. Reviewed with provider, chart checked and refilled.

## 2017-03-07 ENCOUNTER — Encounter (HOSPITAL_COMMUNITY): Payer: Self-pay

## 2017-03-14 ENCOUNTER — Encounter (HOSPITAL_COMMUNITY): Payer: Medicare Other | Attending: Oncology

## 2017-03-14 ENCOUNTER — Encounter (HOSPITAL_COMMUNITY): Payer: Self-pay

## 2017-03-14 VITALS — BP 144/74 | HR 90 | Temp 97.6°F | Resp 18

## 2017-03-14 DIAGNOSIS — Z8543 Personal history of malignant neoplasm of ovary: Secondary | ICD-10-CM | POA: Diagnosis not present

## 2017-03-14 DIAGNOSIS — Z95828 Presence of other vascular implants and grafts: Secondary | ICD-10-CM

## 2017-03-14 DIAGNOSIS — Z452 Encounter for adjustment and management of vascular access device: Secondary | ICD-10-CM | POA: Diagnosis not present

## 2017-03-14 DIAGNOSIS — C569 Malignant neoplasm of unspecified ovary: Secondary | ICD-10-CM | POA: Insufficient documentation

## 2017-03-14 DIAGNOSIS — Z85048 Personal history of other malignant neoplasm of rectum, rectosigmoid junction, and anus: Secondary | ICD-10-CM

## 2017-03-14 DIAGNOSIS — C210 Malignant neoplasm of anus, unspecified: Secondary | ICD-10-CM | POA: Insufficient documentation

## 2017-03-14 MED ORDER — SODIUM CHLORIDE 0.9% FLUSH
10.0000 mL | INTRAVENOUS | Status: DC | PRN
  Administered 2017-03-14: 10 mL via INTRAVENOUS
  Filled 2017-03-14: qty 10

## 2017-03-14 MED ORDER — HEPARIN SOD (PORK) LOCK FLUSH 100 UNIT/ML IV SOLN
500.0000 [IU] | Freq: Once | INTRAVENOUS | Status: AC
  Administered 2017-03-14: 500 [IU] via INTRAVENOUS
  Filled 2017-03-14: qty 5

## 2017-03-14 NOTE — Patient Instructions (Signed)
[facility] at [facility] Discharge Instructions  RECOMMENDATIONS MADE BY THE CONSULTANT AND ANY TEST RESULTS WILL BE SENT TO YOUR REFERRING PHYSICIAN.  You had your port flushed today Follow up as previously scheduled.  Thank you for choosing [facility] at [facility] to provide your oncology and hematology care.  To afford each patient quality time with our provider, please arrive at least 15 minutes before your scheduled appointment time.    If you have a lab appointment with the Cancer Center please come in thru the  Main Entrance and check in at the main information desk  You need to re-schedule your appointment should you arrive 10 or more minutes late.  We strive to give you quality time with our providers, and arriving late affects you and other patients whose appointments are after yours.  Also, if you no show three or more times for appointments you may be dismissed from the clinic at the providers discretion.     Again, thank you for choosing [facility].  Our hope is that these requests will decrease the amount of time that you wait before being seen by our physicians.       _____________________________________________________________  Should you have questions after your visit to  Cancer Center, please contact our office at [phone] between the hours of 8:30 a.m. and 4:30 p.m.  Voicemails left after 4:30 p.m. will not be returned until the following business day.  For prescription refill requests, have your pharmacy contact our office.       Resources For Cancer Patients and their Caregivers ? American Cancer Society: Can assist with transportation, wigs, general needs, runs Look Good Feel Better.        1-888-227-6333 ? Cancer Care: Provides financial assistance, online support groups, medication/co-pay assistance.  1-800-813-HOPE (4673) ? [facility] Assists [place]  cancer patients and their families through emotional , educational and financial support.  336-427-4357 ? [place] DSS Where to apply for food stamps, Medicaid and utility assistance. 336-342-1394 ? [facility]: Transportation to medical appointments. 336-347-2287 ? Social Security Administration: May apply for disability if have a Stage IV cancer. 336-342-7796 1-800-772-1213 ? [place] Aging, Disability and Transit Services: Assists with nutrition, care and transit needs. 336-349-2343  Cancer Center Support Programs: [email or web address] > Cancer Support Group  2nd Tuesday of the month 1pm-2pm, Journey Room  > Creative Journey  3rd Tuesday of the month 1130am-1pm, Journey Room  > Look Good Feel Better  1st Wednesday of the month 10am-12 noon, Journey Room (Call American Cancer Society to register 1-800-395-5775)    

## 2017-03-14 NOTE — Progress Notes (Signed)
Abigail Wagner presented for Portacath access and flush.  Portacath located right chest wall accessed with  H 20 needle.  Good blood return present. Portacath flushed with 23ml NS and 500U/48ml Heparin and needle removed intact.  Procedure tolerated well and without incident.  Patient discharged ambulatory and in stable condition from clinic to self. Follow up as scheduled.

## 2017-03-20 ENCOUNTER — Telehealth (HOSPITAL_COMMUNITY): Payer: Self-pay | Admitting: Emergency Medicine

## 2017-03-20 NOTE — Telephone Encounter (Signed)
Spoke with pt about genetic testing.  Pt has appt on July 12 at 1 pm.

## 2017-04-05 ENCOUNTER — Encounter (HOSPITAL_COMMUNITY): Payer: Self-pay | Admitting: Genetic Counselor

## 2017-04-05 ENCOUNTER — Encounter: Payer: Self-pay | Admitting: Hematology

## 2017-04-05 ENCOUNTER — Encounter (HOSPITAL_COMMUNITY): Payer: Medicare Other | Attending: Oncology | Admitting: Genetic Counselor

## 2017-04-05 ENCOUNTER — Encounter (HOSPITAL_COMMUNITY): Payer: Medicare Other

## 2017-04-05 DIAGNOSIS — Z85048 Personal history of other malignant neoplasm of rectum, rectosigmoid junction, and anus: Secondary | ICD-10-CM

## 2017-04-05 DIAGNOSIS — Z8543 Personal history of malignant neoplasm of ovary: Secondary | ICD-10-CM

## 2017-04-05 DIAGNOSIS — C569 Malignant neoplasm of unspecified ovary: Secondary | ICD-10-CM | POA: Diagnosis not present

## 2017-04-05 DIAGNOSIS — Z315 Encounter for genetic counseling: Secondary | ICD-10-CM

## 2017-04-05 DIAGNOSIS — C210 Malignant neoplasm of anus, unspecified: Secondary | ICD-10-CM | POA: Insufficient documentation

## 2017-04-05 NOTE — Progress Notes (Signed)
REFERRING PROVIDER: [facility], PA-C [address] [place], [place] [address]  PRIMARY PROVIDER:  Raylene Everts, MD  PRIMARY REASON FOR VISIT:  1. Epithelial ovarian cancer, FIGO stage IIIA ([facility])      HISTORY OF PRESENT ILLNESS:   Abigail Wagner, a 66 y.o. female, was seen for a  cancer genetics consultation at the request of Robynn Pane PA-C due to a personal history of cancer.  Abigail Wagner presents to clinic today to discuss the possibility of a hereditary predisposition to cancer, genetic testing, and to further clarify her future cancer risks, as well as potential cancer risks for family members.   In 2015, at the age of 6, Abigail Wagner was diagnosed with ovarian cancer. This was treated with surgery and chemotherapy.  In 2017 at the age of 9, Abigail Wagner was diagnosed with squamous cell anal cancer.   She reports never having genetic testing.     CANCER HISTORY:    History of ovarian cancer   06/02/2014 Procedure    Diagnostic laparoscopy, lysis of adhesions, biopsy of omental nodules, biopsy right ovary by Dr Dalbert Batman      06/04/2014 Pathology Results    Diagnosis 1. Omentum, biopsy, Anterior peritoneal & omental nodule - SEROUS CARCINOMA WITH ASSOCIATED ABUNDANT PSAMMOMA BODIES AND DESMOPLASTIC STROMAL REACTION CONSISTENT WITH INVASIVE IMPLANTS. - SEE COMMENT. 2. Omentum, biopsy, Omental nodule - SEROUS CARCINOMA WITH ASSOCIATED ABUNDANT PSAMMOMA BODIES AND DESMOPLASTIC STROMAL REACTION CONSISTENT WITH INVASIVE IMPLANTS. - SEE COMMENT. 3. Ovary, biopsy/wedge resection, Right ovary - SMALL FRAGMENTS OF ATYPICAL PAPILLARY PROLIFERATION WITH PSAMMOMA BODIES CONSISTENT WITH AT LEAST SEROUS BORDERLINE TUMOR.      07/03/2014 Procedure    Exploratory laparotomy, omentectomy, Bilateral salpingo-oophorectomy, inptraperitoneal PORT placement by Dr. Alycia Rossetti          07/24/2014 - 12/07/2014 Chemotherapy    Carboplatin/Paclitaxel x 6 cycles       08/17/2014 Procedure   Insertion of 8 French power port clear view tunneled venous vascular access device next line use of fluoroscopy for guidance and positioning Use of ultrasound for venipuncture Removal of intraperitoneal chemotherapy port By Dr. Dion Body cancer [facility])   09/09/2015 Procedure    Rectovaginal septal mass biopsy by Dr. Marcello Moores      09/10/2015 Pathology Results    Soft tissue mass, biopsy, rectovaginal septal mass - SQUAMOUS CELL CARCINOMA.      10/15/2015 PET scan    1. Focal hypermetabolism with associated ill-defined soft tissue fullness at the junction of the anterior anal wall and posterior inferior vaginal wall, in keeping with the provided history of a primary anal carcinoma. 2. No hypermetabolic locoregional or distant metastatic disease. 3. Status post hysterectomy, with no abnormal findings at the vaginal cuff. No evidence of hypermetabolic peritoneal tumor. 4. Tiny nonobstructing bilateral renal stones.      10/25/2015 - 12/06/2015 Radiation Therapy    [place], [place]      10/25/2015 - 12/06/2015 Chemotherapy    Mitomycin C and 5FU, by Dr. Jacquiline Doe       06/15/2016 Procedure    EXCISION RECTOVAGINAOL FISTULA WITH MUCOSAL ADVANCEMENT FLAP by Dr. Marcello Moores      06/18/2016 Pathology Results    Fistula, rectovaginal SQUAMOUS, COLUMNAR JUNCTION MUCOSA AND SUBCUTANOUS SOFT TISSUE WITH FISTULA NO EVIDENCE OF MALIGNANCY      09/20/2016 Imaging    CT CAP- 1. No acute findings and no evidence for recurrent tumor or metastatic disease. 2. Aortic atherosclerosis and coronary artery  calcification        HORMONAL RISK FACTORS:  Menarche was at age 47.  First live birth at age 4.  OCP use for approximately <5 years.  Ovaries intact: no.  Hysterectomy: yes.  Menopausal status: postmenopausal.  HRT use: 0 years. Colonoscopy: yes; a few polyps. Mammogram within the last year: yes. Number of breast biopsies: 0. Up to date with pelvic exams:  yes. Any excessive radiation  exposure in the past:  no  Past Medical History:  Diagnosis Date  . Allergy   . Anal carcinoma [facility]) oncologist-  dr Whitney Muse ([facility])   dx 12/ 2016 [facility] --  completed therapy  03/ 2017  . Anemia   . Anxiety   . Blood transfusion without reported diagnosis   . Carcinoma of ovary, stage 3 [facility]) oncologist-  dr gehrig/  dr Doreene Eland (cancer center in [place])--  no recurrency   dx 09/ 2015  Stage IIIB  papillary ovarian carcinoma  s/p  omentectomy and BSO &  chemotherapy (completed 12-07-2014)  . Chemotherapy induced nausea and vomiting 10/23/2014  . Chronic kidney disease    stones  . DDD (degenerative disc disease), lumbosacral   . GERD (gastroesophageal reflux disease)   . Headache(784.0)   . History of acute respiratory failure    05-30-2009  drug overdose-- (intubated for 2 days)  and aspiration pneumonia  . History of adenomatous polyp of colon 10/07/2015   tubular adenoma high grade dysplasia  . History of herpes genitalis   . History of kidney stones   . History of ovarian cancer 08/03/2015  . History of suicide attempt    per documentation in epic  05-30-2009  overdose benaodiazepine  . HTN (hypertension)    takes Metoprolol daily  . Hyperlipidemia   . Hypertension 09/27/2016  . IBS (irritable bowel syndrome)    takes Bentyl daily  . Major depression   . Mood disorder ([place])   . OA (osteoarthritis)   . Prolapse of vaginal vault after hysterectomy 07/20/2015  . Rectovaginal fistula 08/05/2015  . Sigmoid diverticulosis   . Smokers' cough [facility])     Past Surgical History:  Procedure Laterality Date  . BREAST ENHANCEMENT SURGERY  1986  . [place]  . COLONOSCOPY N/A 10/07/2015   Procedure: COLONOSCOPY;  Surgeon: Rogene Houston, MD;  [facility];  Service: Endoscopy;  Laterality: N/A;  7:30  . EXPLORATORY LAPAROTOMY/ OMENTECTOMY/  BILATERAL SALPINGOOPHORECTOMY/  [facility] PLACEMENT  07-03-2014   [facility]  . KNEE ARTHROSCOPY  Left 09-22-2004  . LAPAROSCOPY N/A 06/02/2014   Procedure: DIAGNOSTIC LAPAROSCOPY, OMENTAL BIOPSY, RIGHT OVARY BIOPSY, LYSIS OF ADHESIONS;  Surgeon: Fanny Skates, MD;  Location: [place];  Service: General;  Laterality: N/A;  . MUCOSAL ADVANCEMENT FLAP N/A 06/15/2016   Procedure: EXCISION RECTOVAGINAOL FISTULA WITH MUCOSAL ADVANCEMENT FLAP;  Surgeon: Leighton Ruff, MD;  Location: [place];  Service: General;  Laterality: N/A;  . PLACEMENT OF SETON N/A 09/09/2015   Procedure: PLACEMENT OF SETON;  Surgeon: Leighton Ruff, MD;  Location: [place];  Service: General;  Laterality: N/A;  . PORT-A-CATH REMOVAL Right 08/17/2014   Procedure: REMOVAL INTRAPERITONEAL CHEMO PORT;  Surgeon: Fanny Skates, MD;  Location: [place];  Service: General;  Laterality: Right;  . PORTACATH PLACEMENT Right 08/17/2014   Procedure:  PLACE NEW PORT A CATH;  Surgeon: Fanny Skates, MD;  Location: [place];  Service: General;  Laterality: Right;  .  RECTAL BIOPSY N/A 09/09/2015   Procedure: BIOPSY OF RECTOVAGINAL MASS;  Surgeon: Leighton Ruff, MD;  Location: [place];  Service: General;  Laterality: N/A;  . TUBAL LIGATION  YRS AGO  . VAGINAL HYSTERECTOMY  1981   fibroids    Social History   Social History  . Marital status: Married    Spouse name: Coralyn Mark  . Number of children: 3  . Years of education: 12   Occupational History  . retire     [employer]   Social History Main Topics  . Smoking status: Former Smoker    Packs/day: 1.00    Years: 43.00    Types: Cigarettes    Quit date: 05/24/2016  . Smokeless tobacco: Never Used  . Alcohol use No  . Drug use: No  . Sexual activity: Not Currently    Birth control/ protection: Surgical   Other Topics Concern  . None   Social History Narrative   Lives at home with terry   Retired [employer]        FAMILY HISTORY:  We obtained a detailed, 4-generation family history.   Significant diagnoses are listed below: Family History  Problem Relation Age of Onset  . Bipolar disorder Mother   . Anxiety disorder Mother   . Dementia Mother   . Depression Mother   . Mental illness Mother   . Vision loss Mother   . Alzheimer's disease Mother   . Alcohol abuse Paternal Uncle   . Bipolar disorder Maternal Grandmother   . Dementia Maternal Grandmother   . Alzheimer's disease Maternal Grandmother   . Alcohol abuse Paternal Uncle   . Stroke Brother   . Deep vein thrombosis Son   . Heart disease Father   . Hyperlipidemia Father   . Hypertension Father   . Stroke Father   . Vision loss Father   . Atrial fibrillation Sister   . Heart disease Brother   . Heart disease Brother   . ADD / ADHD Neg Hx   . Drug abuse Neg Hx   . OCD Neg Hx   . Paranoid behavior Neg Hx   . Schizophrenia Neg Hx   . Seizures Neg Hx   . Sexual abuse Neg Hx   . Physical abuse Neg Hx     The patient has one daughter and two sons who are cancer free.  She has three brothers and one sister who are all cancer free.  One brother died of a massive stroke.  Both parents are deceased.  Her mother died from complications of dementia and her father died from a massive stroke.  Her mother had two sisters, who are alive, and two brothers.  None had cancer.  Her maternal grandparents are deceased.  The patient's father had two brothers and two sisters who are all deceased.  No reported family history of cancer on the paternal side.  Abigail. Anthis is unaware of previous family history of genetic testing for hereditary cancer risks. Patient's maternal ancestors are of [place] descent, and paternal ancestors are of [place] descent. There is no reported Ashkenazi Jewish ancestry. There is no known consanguinity.  GENETIC COUNSELING ASSESSMENT: Abigail Wagner is a 66 y.o. female with a personal history of ovarian and anal cancer which is somewhat suggestive of a hereditary cancer syndrome and  predisposition to cancer. We, therefore, discussed and recommended the following at today's visit.   DISCUSSION: We discussed that about 20-25% of ovarian cancer is hereditary with  most cases due to BRCA mutations.  There are other causes of hereditary ovarian cancer including BRIP1, RAD51C, RAD51D and the Lynch syndrome genes.  We reviewed the characteristics, features and inheritance patterns of hereditary cancer syndromes. We also discussed genetic testing, including the appropriate family members to test, the process of testing, insurance coverage and turn-around-time for results. We discussed the implications of a negative, positive and/or variant of uncertain significant result. We recommended Abigail Wagner pursue genetic testing for the [facility] gene panel. The [facility] gene panel offered by [employer] includes sequencing and deletion/duplication testing of the following 28 genes: APC, ATM, BARD1, BMPR1A, BRCA1, BRCA2, BRIP1, CHD1, CDK4, CDKN2A, CHEK2, EPCAM (large rearrangement only), MLH1, MSH2, MSH6, MUTYH, NBN, PALB2, PMS2, PTEN, RAD51C, RAD51D, SMAD4, STK11, and TP53. Sequencing was performed for select regions of POLE and POLD1, and large rearrangement analysis was performed for select regions of GREM1.   We discussed that genetic testing through [facility] will test for hereditary mutations that could explain her diagnosis of cancer.  However, homologous recombination testing (HRD) is genetic testing performed on her tumor that can determine genetic changes that could influence her management.  HRD testing is performed in tandem with genetic testing, and typically at no additional cost.  Abigail Wagner tumor is [facility] at [facility].  We will fill out the forms and have this sent to [facility].  Based on Abigail Wagner's personal history of cancer, she meets medical criteria for genetic testing. Despite that she meets criteria, she may still have an out of pocket cost. We discussed that if  her out of pocket cost for testing is over $100, the laboratory will call and confirm whether she wants to proceed with testing.  If the out of pocket cost of testing is less than $100 she will be billed by the genetic testing laboratory.   PLAN: After considering the risks, benefits, and limitations, Abigail Wagner  provided informed consent to pursue genetic testing and the blood sample was sent to [facility] for analysis of the [facility] panel and HRD testing. Results should be available within approximately 2-3 weeks' time, at which point they will be disclosed by telephone to Abigail Wagner, as will any additional recommendations warranted by these results. Abigail Wagner will receive a summary of her genetic counseling visit and a copy of her results once available. This information will also be available in Epic. We encouraged Abigail Wagner to remain in contact with cancer genetics annually so that we can continuously update the family history and inform her of any changes in cancer genetics and testing that may be of benefit for her family. Abigail Wagner questions were answered to her satisfaction today. Our contact information was provided should additional questions or concerns arise.  Lastly, we encouraged Abigail Wagner to remain in contact with cancer genetics annually so that we can continuously update the family history and inform her of any changes in cancer genetics and testing that may be of benefit for this family.   Abigail.  Wagner questions were answered to her satisfaction today. Our contact information was provided should additional questions or concerns arise. Thank you for the referral and allowing [place] to share in the care of your patient.   Clearence Vitug P. Florene [place], [place], [facility] Certified Genetic Counselor [email or web address] phone: [phone]  The patient was seen for a total of 35 minutes in face-to-face genetic counseling.  This patient was discussed with Drs. Magrinat, Lindi Adie and/or Burr Medico who agrees  with the above.  _______________________________________________________________________ For Office Staff:  Number of people involved in session: 1 Was an Intern/ student involved with case: no

## 2017-04-26 ENCOUNTER — Ambulatory Visit (INDEPENDENT_AMBULATORY_CARE_PROVIDER_SITE_OTHER): Payer: Medicare Other | Admitting: Psychiatry

## 2017-04-26 ENCOUNTER — Encounter (HOSPITAL_COMMUNITY): Payer: Self-pay | Admitting: Psychiatry

## 2017-04-26 VITALS — BP 165/82 | HR 89 | Ht 65.0 in | Wt 143.2 lb

## 2017-04-26 DIAGNOSIS — F331 Major depressive disorder, recurrent, moderate: Secondary | ICD-10-CM | POA: Diagnosis not present

## 2017-04-26 MED ORDER — DIVALPROEX SODIUM ER 500 MG PO TB24: 500.0000 mg | ORAL_TABLET | Freq: Every day | ORAL | 2 refills | Status: DC

## 2017-04-26 MED ORDER — ESCITALOPRAM OXALATE 20 MG PO TABS: 20.0000 mg | ORAL_TABLET | Freq: Two times a day (BID) | ORAL | 2 refills | Status: DC

## 2017-04-26 MED ORDER — LORAZEPAM 1 MG PO TABS: 1.0000 mg | ORAL_TABLET | Freq: Three times a day (TID) | ORAL | 3 refills | Status: DC | PRN
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
## 2017-04-27 ENCOUNTER — Ambulatory Visit (HOSPITAL_COMMUNITY): Payer: Medicare Other | Admitting: Psychiatry

## 2017-05-02 ENCOUNTER — Encounter (HOSPITAL_COMMUNITY): Payer: Medicare Other | Attending: Oncology | Admitting: Adult Health

## 2017-05-02 ENCOUNTER — Encounter (HOSPITAL_COMMUNITY): Payer: Self-pay

## 2017-05-02 ENCOUNTER — Encounter (HOSPITAL_BASED_OUTPATIENT_CLINIC_OR_DEPARTMENT_OTHER): Payer: Medicare Other

## 2017-05-02 ENCOUNTER — Encounter (HOSPITAL_COMMUNITY): Payer: Self-pay | Admitting: Adult Health

## 2017-05-02 VITALS — BP 150/57 | HR 88 | Resp 16 | Ht 65.0 in | Wt 145.0 lb

## 2017-05-02 DIAGNOSIS — C210 Malignant neoplasm of anus, unspecified: Secondary | ICD-10-CM | POA: Diagnosis not present

## 2017-05-02 DIAGNOSIS — Z85048 Personal history of other malignant neoplasm of rectum, rectosigmoid junction, and anus: Secondary | ICD-10-CM

## 2017-05-02 DIAGNOSIS — Z8543 Personal history of malignant neoplasm of ovary: Secondary | ICD-10-CM

## 2017-05-02 DIAGNOSIS — D509 Iron deficiency anemia, unspecified: Secondary | ICD-10-CM | POA: Diagnosis not present

## 2017-05-02 DIAGNOSIS — C569 Malignant neoplasm of unspecified ovary: Secondary | ICD-10-CM

## 2017-05-02 DIAGNOSIS — Z1231 Encounter for screening mammogram for malignant neoplasm of breast: Secondary | ICD-10-CM

## 2017-05-02 DIAGNOSIS — D649 Anemia, unspecified: Secondary | ICD-10-CM

## 2017-05-02 LAB — COMPREHENSIVE METABOLIC PANEL
ALT: 18 U/L (ref 14–54)
ANION GAP: 8 (ref 5–15)
AST: 24 U/L (ref 15–41)
Albumin: 3.7 g/dL (ref 3.5–5.0)
Alkaline Phosphatase: 50 U/L (ref 38–126)
BILIRUBIN TOTAL: 0.4 mg/dL (ref 0.3–1.2)
BUN: 17 mg/dL (ref 6–20)
CO2: 26 mmol/L (ref 22–32)
Calcium: 9.2 mg/dL (ref 8.9–10.3)
Chloride: 106 mmol/L (ref 101–111)
Creatinine, Ser: 0.54 mg/dL (ref 0.44–1.00)
GFR calc Af Amer: >60 mL/min (ref >60)
Glucose, Bld: 118 mg/dL — ABNORMAL HIGH (ref 65–99)
POTASSIUM: 3.8 mmol/L (ref 3.5–5.1)
Sodium: 140 mmol/L (ref 135–145)
TOTAL PROTEIN: 6.8 g/dL (ref 6.5–8.1)

## 2017-05-02 LAB — CBC WITH DIFFERENTIAL/PLATELET
Basophils Absolute: 0 10*3/uL (ref 0.0–0.1)
Basophils Relative: 0 %
Eosinophils Absolute: 0.2 10*3/uL (ref 0.0–0.7)
Eosinophils Relative: 4 %
HEMATOCRIT: 35.2 % — AB (ref 36.0–46.0)
Hemoglobin: 11.9 g/dL — ABNORMAL LOW (ref 12.0–15.0)
LYMPHS ABS: 1.4 10*3/uL (ref 0.7–4.0)
LYMPHS PCT: 28 %
MCH: 29.9 pg (ref 26.0–34.0)
MCHC: 33.8 g/dL (ref 30.0–36.0)
MCV: 88.4 fL (ref 78.0–100.0)
MONO ABS: 0.3 10*3/uL (ref 0.1–1.0)
MONOS PCT: 6 %
NEUTROS ABS: 3.1 10*3/uL (ref 1.7–7.7)
Neutrophils Relative %: 62 %
Platelets: 226 10*3/uL (ref 150–400)
RBC: 3.98 MIL/uL (ref 3.87–5.11)
RDW: 15.4 % (ref 11.5–15.5)
WBC: 5 10*3/uL (ref 4.0–10.5)

## 2017-05-02 LAB — FERRITIN: Ferritin: 18 ng/mL (ref 11–307)

## 2017-05-02 LAB — IRON AND TIBC
IRON: 52 ug/dL (ref 28–170)
Saturation Ratios: 13 % (ref 10.4–31.8)
TIBC: 388 ug/dL (ref 250–450)
UIBC: 336 ug/dL

## 2017-05-02 MED ORDER — HEPARIN SOD (PORK) LOCK FLUSH 100 UNIT/ML IV SOLN
500.0000 [IU] | Freq: Once | INTRAVENOUS | Status: AC
  Administered 2017-05-02: 500 [IU] via INTRAVENOUS

## 2017-05-02 MED ORDER — HEPARIN SOD (PORK) LOCK FLUSH 100 UNIT/ML IV SOLN
INTRAVENOUS | Status: AC
  Filled 2017-05-02: qty 5

## 2017-05-02 MED ORDER — SODIUM CHLORIDE 0.9% FLUSH
20.0000 mL | Freq: Once | INTRAVENOUS | Status: AC
  Administered 2017-05-02: 20 mL via INTRAVENOUS

## 2017-05-02 NOTE — Progress Notes (Signed)
[facility], [place] [id number]   CLINIC:  Medical Oncology/Hematology  PCP:  Abigail Everts, MD [phone] [address] [place] [address] [phone]   REASON FOR VISIT:  Follow-up for Stage II anal cancer AND Stage III ovarian cancer AND iron deficiency anemia   CURRENT THERAPY: Surveillance per NCCN Guidelines for both malignancies AND Ferrex forte po daily    BRIEF ONCOLOGIC HISTORY:    History of ovarian cancer   06/02/2014 Procedure    Diagnostic laparoscopy, lysis of adhesions, biopsy of omental nodules, biopsy right ovary by Dr Abigail Wagner      06/04/2014 Pathology Results    Diagnosis 1. Omentum, biopsy, Anterior peritoneal & omental nodule - SEROUS CARCINOMA WITH ASSOCIATED ABUNDANT PSAMMOMA BODIES AND DESMOPLASTIC STROMAL REACTION CONSISTENT WITH INVASIVE IMPLANTS. - SEE COMMENT. 2. Omentum, biopsy, Omental nodule - SEROUS CARCINOMA WITH ASSOCIATED ABUNDANT PSAMMOMA BODIES AND DESMOPLASTIC STROMAL REACTION CONSISTENT WITH INVASIVE IMPLANTS. - SEE COMMENT. 3. Ovary, biopsy/wedge resection, Right ovary - SMALL FRAGMENTS OF ATYPICAL PAPILLARY PROLIFERATION WITH PSAMMOMA BODIES CONSISTENT WITH AT LEAST SEROUS BORDERLINE TUMOR.      07/03/2014 Procedure    Exploratory laparotomy, omentectomy, Bilateral salpingo-oophorectomy, inptraperitoneal PORT placement by Dr. Alycia Wagner          07/24/2014 - 12/07/2014 Chemotherapy    Carboplatin/Paclitaxel x 6 cycles       08/17/2014 Procedure    Insertion of 8 French power port clear view tunneled venous vascular access device next line use of fluoroscopy for guidance and positioning Use of ultrasound for venipuncture Removal of intraperitoneal chemotherapy port By Dr. Dion [facility] cancer [facility])   09/09/2015 Procedure    Rectovaginal septal mass biopsy by Dr. Marcello Wagner      09/10/2015 Pathology Results    Soft tissue mass, biopsy, rectovaginal septal mass - SQUAMOUS CELL  CARCINOMA.      10/15/2015 PET scan    1. Focal hypermetabolism with associated ill-defined soft tissue fullness at the junction of the anterior anal wall and posterior inferior vaginal wall, in keeping with the provided history of a primary anal carcinoma. 2. No hypermetabolic locoregional or distant metastatic disease. 3. Status post hysterectomy, with no abnormal findings at the vaginal cuff. No evidence of hypermetabolic peritoneal tumor. 4. Tiny nonobstructing bilateral renal stones.      10/25/2015 - 12/06/2015 Radiation Therapy    Abigail Wagner, [facility]ail Wagner      10/25/2015 - 12/06/2015 Chemotherapy    Mitomycin C and 5FU, by Dr. Jacquiline Wagner       06/15/2016 Procedure    EXCISION RECTOVAGINAOL FISTULA WITH MUCOSAL ADVANCEMENT FLAP by Dr. Marcello Wagner      06/18/2016 Pathology Results    Fistula, rectovaginal SQUAMOUS, COLUMNAR JUNCTION MUCOSA AND SUBCUTANOUS SOFT TISSUE WITH FISTULA NO EVIDENCE OF MALIGNANCY      09/20/2016 Imaging    CT CAP- 1. No acute findings and no evidence for recurrent tumor or metastatic disease. 2. Aortic atherosclerosis and coronary artery calcification        INTERVAL HISTORY:  [name] 66 y.o. female returns for routine follow-up for history of anal cancer and ovarian cancer.   Overall, she tells me she has been feeling quite well. Appetite 100%; energy levels 75%.  Denies any abdominal pain/bloating, N&V, rectal pain, blood in stools, constipation, or diarrhea.  She last saw Abigail Wagner, her surgeon, in April or May of this year. [name] sees her every 4 months or so for  DRE and follow-up visit. Denies any pelvic pain, vaginal dryness, vaginal atrophy symptoms, or sexual/intimacy concerns.   She is tolerating oral iron well without any difficulties. Her fatigue has actually improved some since starting the oral iron and overall she is feeling better since starting it.    She sees her PCP, Abigail Wagner, regularly.  Her screening mammogram is due in 05/2017.     Otherwise, she is largely without complaints today.     REVIEW OF SYSTEMS:  Review of Systems  Constitutional: Negative.  Negative for chills, fatigue and fever.  HENT:  Negative.  Negative for lump/mass and nosebleeds.   Eyes: Negative.   Respiratory: Negative.  Negative for cough and shortness of breath.   Cardiovascular: Negative.  Negative for chest pain and leg swelling.  Gastrointestinal: Negative.  Negative for abdominal pain, blood in stool, constipation, diarrhea, nausea and vomiting.  Endocrine: Negative.   Genitourinary: Negative.  Negative for dysuria and hematuria.   Musculoskeletal: Negative.  Negative for arthralgias.  Skin: Negative.  Negative for rash.  Neurological: Negative.  Negative for dizziness and headaches.  Hematological: Negative.  Negative for adenopathy. Does not bruise/bleed easily.  Psychiatric/Behavioral: Negative.  Negative for depression and sleep disturbance. The patient is not nervous/anxious.      PAST MEDICAL/SURGICAL HISTORY:  Past Medical History:  Diagnosis Date  . Allergy   . Anal carcinoma Abigail Wagner) oncologist-  dr Abigail Wagner ([facility] Wagner)   dx 12/ 2016 [facility] --  completed therapy  03/ 2017  . Anemia   . Anxiety   . Blood transfusion without reported diagnosis   . Carcinoma of ovary, stage 3 Abigail Wagner) oncologist-  dr Abigail Wagner/  dr Abigail Wagner (cancer Wagner in [facility])--  no recurrency   dx 09/ 2015  Stage IIIB  papillary ovarian carcinoma  s/p  omentectomy and BSO &  chemotherapy (completed 12-07-2014)  . Chemotherapy induced nausea and vomiting 10/23/2014  . Chronic kidney disease    stones  . DDD (degenerative disc disease), lumbosacral   . GERD (gastroesophageal reflux disease)   . Headache(784.0)   . History of acute respiratory failure    05-30-2009  drug overdose-- (intubated for 2 days)  and aspiration pneumonia  . History of adenomatous polyp of colon 10/07/2015   tubular adenoma high grade dysplasia  . History of herpes  genitalis   . History of kidney stones   . History of ovarian cancer 08/03/2015  . History of suicide attempt    per documentation in epic  05-30-2009  overdose benaodiazepine  . HTN (hypertension)    takes Metoprolol daily  . Hyperlipidemia   . Hypertension 09/27/2016  . IBS (irritable bowel syndrome)    takes Bentyl daily  . Major depression   . Mood disorder ([place])   . OA (osteoarthritis)   . Prolapse of vaginal vault after hysterectomy 07/20/2015  . Rectovaginal fistula 08/05/2015  . Sigmoid diverticulosis   . Smokers' cough [facility])    Past Surgical History:  Procedure Laterality Date  . BREAST ENHANCEMENT SURGERY  1986  . [place]  . COLONOSCOPY N/A 10/07/2015   Procedure: COLONOSCOPY;  Surgeon: Rogene Houston, MD;  [facility];  Service: Endoscopy;  Laterality: N/A;  7:30  . EXPLORATORY LAPAROTOMY/ OMENTECTOMY/  BILATERAL SALPINGOOPHORECTOMY/  [facility] PLACEMENT  07-03-2014   [facility]  . KNEE ARTHROSCOPY Left 09-22-2004  . LAPAROSCOPY N/A 06/02/2014   Procedure: DIAGNOSTIC LAPAROSCOPY, OMENTAL BIOPSY, RIGHT OVARY BIOPSY, LYSIS OF ADHESIONS;  Surgeon: Renelda Loma  Abigail Batman, MD;  Location: [place];  Service: General;  Laterality: N/A;  . MUCOSAL ADVANCEMENT FLAP N/A 06/15/2016   Procedure: EXCISION RECTOVAGINAOL FISTULA WITH MUCOSAL ADVANCEMENT FLAP;  Surgeon: Abigail Ruff, MD;  Location: [place];  Service: General;  Laterality: N/A;  . PLACEMENT OF SETON N/A 09/09/2015   Procedure: PLACEMENT OF SETON;  Surgeon: Abigail Ruff, MD;  Location: [place];  Service: General;  Laterality: N/A;  . PORT-A-CATH REMOVAL Right 08/17/2014   Procedure: REMOVAL INTRAPERITONEAL CHEMO PORT;  Surgeon: Fanny Skates, MD;  Location: [place];  Service: General;  Laterality: Right;  . PORTACATH PLACEMENT Right 08/17/2014   Procedure:  PLACE NEW PORT A CATH;  Surgeon: Fanny Skates, MD;  Location: [place];   Service: General;  Laterality: Right;  . RECTAL BIOPSY N/A 09/09/2015   Procedure: BIOPSY OF RECTOVAGINAL MASS;  Surgeon: Abigail Ruff, MD;  Location: [facility] [place];  Service: General;  Laterality: N/A;  . TUBAL LIGATION  YRS AGO  . VAGINAL HYSTERECTOMY  1981   fibroids     SOCIAL HISTORY:  Social History   Social History  . Marital status: Married    Spouse name: Coralyn Mark  . Number of children: 3  . Years of education: 12   Occupational History  . retire     [employer]   Social History Main Topics  . Smoking status: Former Smoker    Packs/day: 1.00    Years: 43.00    Types: Cigarettes    Quit date: 05/24/2016  . Smokeless tobacco: Never Used  . Alcohol use No  . Drug use: No  . Sexual activity: Not Currently    Birth control/ protection: Surgical   Other Topics Concern  . Not on file   Social History Narrative   Lives at home with terry   Retired [employer]       FAMILY HISTORY:  Family History  Problem Relation Age of Onset  . Bipolar disorder Mother   . Anxiety disorder Mother   . Dementia Mother   . Depression Mother   . Mental illness Mother   . Vision loss Mother   . Alzheimer's disease Mother   . Alcohol abuse Paternal Uncle   . Bipolar disorder Maternal Grandmother   . Dementia Maternal Grandmother   . Alzheimer's disease Maternal Grandmother   . Alcohol abuse Paternal Uncle   . Stroke Brother   . Deep vein thrombosis Son   . Heart disease Father   . Hyperlipidemia Father   . Hypertension Father   . Stroke Father   . Vision loss Father   . Atrial fibrillation Sister   . Heart disease Brother   . Heart disease Brother   . ADD / ADHD Neg Hx   . Drug abuse Neg Hx   . OCD Neg Hx   . Paranoid behavior Neg Hx   . Schizophrenia Neg Hx   . Seizures Neg Hx   . Sexual abuse Neg Hx   . Physical abuse Neg Hx     CURRENT MEDICATIONS:  Outpatient Encounter Prescriptions as of 05/02/2017  Medication Sig Note  .  Aspirin-Salicylamide-Caffeine (BC HEADACHE POWDER PO) Take 1 packet by mouth as needed.  07/16/2014: Received from: [facility]  . b complex vitamins tablet Take 1 tablet by mouth daily.   Marland Kitchen dicyclomine (BENTYL) 10 MG capsule 10 to 20 mg by mouth 4 times daily as needed   . divalproex (DEPAKOTE ER) 500  MG 24 hr tablet Take 1 tablet (500 mg total) by mouth daily.   Marland Kitchen escitalopram (LEXAPRO) 20 MG tablet Take 1 tablet (20 mg total) by mouth 2 (two) times daily.   . IRON PO Take by mouth.   . Iron Polysacch Cmplx-B12-FA (POLY-IRON 150 FORTE PO) Take 150 mg by mouth daily.   Marland Kitchen KRILL OIL PO Take 350 mg by mouth daily.   . lansoprazole (PREVACID) 15 MG capsule Take 15 mg by mouth as needed.    Marland Kitchen LORazepam (ATIVAN) 1 MG tablet Take 1 tablet (1 mg total) by mouth 3 (three) times daily as needed for anxiety.   . meloxicam (MOBIC) 15 MG tablet Take 1 tablet (15 mg total) by mouth daily.   . Multiple Vitamin (MULTIVITAMIN) tablet Take 1 tablet by mouth daily.   . valACYclovir (VALTREX) 1000 MG tablet Take 1,000 mg by mouth daily as needed.  04/03/2016: Received from: External Pharmacy   No facility-administered encounter medications on file as of 05/02/2017.     ALLERGIES:  No Known Allergies   PHYSICAL EXAM:  ECOG Performance status: 0 - Asymptomatic   Vitals:   05/02/17 1402  BP: (!) 150/57  Pulse: 88  Resp: 16  SpO2: 99%   Filed Weights   05/02/17 1402  Weight: 145 lb (65.8 kg)    Physical Exam  Constitutional: She is oriented to person, place, and time and well-developed, well-nourished, and in no distress.  HENT:  Head: Normocephalic.  Mouth/Throat: Oropharynx is clear and moist. No oropharyngeal exudate.  Eyes: Pupils are equal, round, and reactive to light. Conjunctivae are normal. No scleral icterus.  Neck: Normal range of motion. Neck supple.  Cardiovascular: Normal rate, regular rhythm and normal heart sounds.   Pulmonary/Chest: Effort normal and breath sounds normal. No  respiratory distress.  Abdominal: Soft. Bowel sounds are normal. There is no tenderness.  Musculoskeletal: Normal range of motion. She exhibits no edema.  Lymphadenopathy:    She has no cervical adenopathy.       Right: No inguinal and no supraclavicular adenopathy present.       Left: No inguinal and no supraclavicular adenopathy present.  Neurological: She is alert and oriented to person, place, and time. No cranial nerve deficit. Gait normal.  Skin: Skin is warm and dry. No rash noted.  Psychiatric: Mood, memory, affect and judgment normal.  Nursing note and vitals reviewed.    LABORATORY DATA:  I have reviewed the labs as listed.  CBC    Component Value Date/Time   WBC 5.0 05/02/2017 1430   RBC 3.98 05/02/2017 1430   HGB 11.9 (L) 05/02/2017 1430   HCT 35.2 (L) 05/02/2017 1430   PLT 226 05/02/2017 1430   MCV 88.4 05/02/2017 1430   MCH 29.9 05/02/2017 1430   MCHC 33.8 05/02/2017 1430   RDW 15.4 05/02/2017 1430   LYMPHSABS 1.4 05/02/2017 1430   MONOABS 0.3 05/02/2017 1430   EOSABS 0.2 05/02/2017 1430   BASOSABS 0.0 05/02/2017 1430   CMP Latest Ref Rng & Units 05/02/2017 01/10/2017 09/27/2016  Glucose 65 - 99 mg/dL 118(H) 90 106(H)  BUN 6 - 20 mg/dL 17 19 19   Creatinine 0.44 - 1.00 mg/dL 0.54 0.51 0.54  Sodium 135 - 145 mmol/L 140 136 136  Potassium 3.5 - 5.1 mmol/L 3.8 3.9 3.8  Chloride 101 - 111 mmol/L 106 102 104  CO2 22 - 32 mmol/L 26 27 27   Calcium 8.9 - 10.3 mg/dL 9.2 8.8(L) 8.9  Total Protein 6.5 -  8.1 g/dL 6.8 6.3(L) 6.1(L)  Total Bilirubin 0.3 - 1.2 mg/dL 0.4 0.2(L) 0.3  Alkaline Phos 38 - 126 U/L 50 54 54  AST 15 - 41 U/L 24 23 25   ALT 14 - 54 U/L 18 19 24        PENDING LABS:    DIAGNOSTIC IMAGING:  *The following radiologic images and reports have been reviewed independently and agree with below findings.  CT chest/abd/pelvis: 09/20/16 CLINICAL DATA:  Anal cancer follow-up  EXAM: CT CHEST, ABDOMEN, AND PELVIS WITH  CONTRAST  TECHNIQUE: Multidetector CT imaging of the chest, abdomen and pelvis was performed following the standard protocol during bolus administration of intravenous contrast.  CONTRAST:  56mL ISOVUE-300 IOPAMIDOL (ISOVUE-300) INJECTION 61%  COMPARISON:  10/15/2015  FINDINGS: CT CHEST FINDINGS  Cardiovascular: The heart size is normal. There is no pericardial effusion peer aortic atherosclerosis is noted. Calcification within the LAD coronary artery noted.  Mediastinum/Nodes: No enlarged mediastinal, hilar, or axillary lymph nodes. Thyroid gland, trachea, and esophagus demonstrate no significant findings.  Lungs/Pleura: Lungs are clear. No pleural effusion or pneumothorax.  Musculoskeletal: No chest wall mass or suspicious bone lesions identified.  CT ABDOMEN PELVIS FINDINGS  Hepatobiliary: No focal liver abnormality is seen. No gallstones, gallbladder wall thickening, or biliary dilatation.  Pancreas: Unremarkable. No pancreatic ductal dilatation or surrounding inflammatory changes.  Spleen: Normal in size without focal abnormality.  Adrenals/Urinary Tract: Adrenal glands are unremarkable. Kidneys are normal, without renal calculi, focal lesion, or hydronephrosis. Bladder is unremarkable.  Stomach/Bowel: Stomach is within normal limits. Appendix appears normal. No evidence of bowel wall thickening, distention, or inflammatory changes.  Vascular/Lymphatic: Aortic atherosclerosis. No aneurysm. There is no upper abdominal adenopathy identified. No pelvic or inguinal adenopathy.  Reproductive: Status post hysterectomy. No adnexal masses.  Other: No abdominal wall hernia or abnormality. No abdominopelvic ascites.  Musculoskeletal: No acute or significant osseous findings.  IMPRESSION: 1. No acute findings and no evidence for recurrent tumor or metastatic disease. 2. Aortic atherosclerosis and coronary artery  calcification   Electronically Signed   By: Kerby Moors M.D.   On: 09/20/2016 09:52     PATHOLOGY:  Rectal path: 09/09/15    Omentum/ovary surgical path: 06/02/14       ASSESSMENT & PLAN:   Stage II anal cancer:  -Diagnosed in 08/2015. Treated with concurrent chemoradiation with 5-FU/Mitomycin C with curative intent. Completed treatment on 12/06/15.  -Last CT chest/abd/pelvis in 08/2016 negative for recurrent disease.  Based on NCCN Guidelines, she does not meet criteria for annual imaging, as she did not have T3/T4 lesions, nor did she have inguinal LN involvement.  We will obtain imaging in the future as clinically indicated with new or worrisome symptoms or abnormal physical exam findings.   -Continue routine follow-up with Dr. Marcello Wagner with GI surgery for DRE and anoscopy as clinically indicated.   -Return to cancer Wagner in 4 months for follow-up. If she continues to be feeling well at that visit, then can consider lengthening her follow-up visits to every 6 months thereafter. She understands that we continue to follow-up with her for at least 5 years post-diagnosis and completion of treatment. She agrees with this plan.      Stage III ovarian cancer:  -Diagnosed in 2015. Treated with debulking surgery with Dr. Alycia Wagner, followed by [facility] chemotherapy x 6 cycles. Completed treatment on 12/07/14. CA-125 19.2 at time of diagnosis.  -CA-125 pending for today. Serial CA-125 monitoring has remained normal.  Clinically, she has no symptoms of recurrent disease  and is now ~3 years out from her initial diagnosis; this is very favorable.   -She was seen by Genetics on 04/05/17; results from genetic testing not available yet.  -Return to cancer [facility] in 4 months for continued follow-up. May be able to move her to every 6 month follow-ups after next visit, as long as she continues to feel well with no concerning symptoms.      Health maintenance/Wellness promotion:  -Annual  mammogram (screening with bilat implants) due in 05/2017; orders placed today.  -Maintain follow-up with PCP as directed for health maintenance.    Iron deficiency anemia:  -On oral iron with ferrex forte and tolerating well.  Iron studies pending for today. Hgb mildly low at 11.9 g/dL today.  No active bleeding episodes.  If iron studies are abnormal, then we can consider replacement with IV iron.  We will contact her with results when they are available.       Dispo:  -Screening mammogram due in 05/2017; orders placed today.  -Maintain follow-up visits with Dr. Marcello Wagner as directed.  -Continue port flush every 2 months.  Can consider removing port-a-cath after next visit in 4 months if she would like.  -Return to cancer [facility] in 4 months for follow-up with labs/port flush. Can consider moving her to every 6 month follow-ups at that time if she continues to feel well.    All questions were answered to patient's stated satisfaction. Encouraged patient to call with any new concerns or questions before her next visit to the cancer Wagner and we can certain see her sooner, if needed.    Plan of care discussed with Dr. Talbert Cage, who agrees with the above aforementioned.    Orders placed this encounter:  Orders Placed This Encounter  Procedures  . MM SCREENING BREAST W/IMPLANT TOMO BILATERAL  . CBC with Differential/Platelet  . Comprehensive metabolic panel  . Ferritin  . Iron and TIBC  . CA Pat[place], NP [place] [phone] '

## 2017-05-02 NOTE — Patient Instructions (Addendum)
[place] at [facility] Discharge Instructions  RECOMMENDATIONS MADE BY THE CONSULTANT AND ANY TEST RESULTS WILL BE SENT TO YOUR REFERRING PHYSICIAN.  You were seen today by Mike Craze NP. Mammogram due in September. Continue port flushes every 2 months. Return in 4 months for follow up and port flush.    Thank you for choosing [place] at [facility] to provide your oncology and hematology care.  To afford each patient quality time with our provider, please arrive at least 15 minutes before your scheduled appointment time.    If you have a lab appointment with the [facility] please come in thru the  Main Entrance and check in at the main information desk  You need to re-schedule your appointment should you arrive 10 or more minutes late.  We strive to give you quality time with our providers, and arriving late affects you and other patients whose appointments are after yours.  Also, if you no show three or more times for appointments you may be dismissed from the clinic at the providers discretion.     Again, thank you for choosing [facility].  Our hope is that these requests will decrease the amount of time that you wait before being seen by our physicians.       _____________________________________________________________  Should you have questions after your visit to [facility], please contact our office at [phone] between the hours of 8:30 a.m. and 4:30 p.m.  Voicemails left after 4:30 p.m. will not be returned until the following business day.  For prescription refill requests, have your pharmacy contact our office.       Resources For Cancer Patients and their Caregivers ? American Cancer Society: Can assist with transportation, wigs, general needs, runs Look Good Feel Better.        307-392-8459 ? Cancer Care: Provides financial assistance, online support groups, medication/co-pay  assistance.  1-800-813-HOPE 443-118-4351) ? [facility] Assists [place] cancer patients and their families through emotional , educational and financial support.  (360)617-0516 ? [place] DSS Where to apply for food stamps, Medicaid and utility assistance. 937 601 8156 ? [facility]: Transportation to medical appointments. (929)576-1851 ? Social Security Administration: May apply for disability if have a Stage IV cancer. 951-861-8304 641-460-0044 ? [employer], Disability and Transit Services: Assists with nutrition, care and transit needs. [place] Support Programs: @10RELATIVEDAYS @ > Cancer Support Group  2nd Tuesday of the month 1pm-2pm, Journey Room  > Creative Journey  3rd Tuesday of the month 1130am-1pm, Journey Room  > Look Good Feel Better  1st Wednesday of the month 10am-12 noon, Journey Room (Call [place] to register [phone])

## 2017-05-03 LAB — CA 125: Cancer Antigen (CA) 125: 7.8 U/mL (ref 0.0–38.1)

## 2017-05-03 NOTE — Patient Instructions (Signed)
[place] at [facility] Discharge Instructions  RECOMMENDATIONS MADE BY THE CONSULTANT AND ANY TEST RESULTS WILL BE SENT TO YOUR REFERRING PHYSICIAN.  Port flushed today.  Keep scheduled appointment and call for any problems.   Thank you for choosing [place] at [facility]) to provide your oncology and hematology care.  To afford each patient quality time with our provider, please arrive at least 15 minutes before your scheduled appointment time.    If you have a lab appointment with the [facility] please come in thru the  Main Entrance and check in at the main information desk  You need to re-schedule your appointment should you arrive 10 or more minutes late.  We strive to give you quality time with our providers, and arriving late affects you and other patients whose appointments are after yours.  Also, if you no show three or more times for appointments you may be dismissed from the clinic at the providers discretion.     Again, thank you for choosing [facility].  Our hope is that these requests will decrease the amount of time that you wait before being seen by our physicians.       _____________________________________________________________  Should you have questions after your visit to [facility], please contact our office at [phone] between the hours of 8:30 a.m. and 4:30 p.m.  Voicemails left after 4:30 p.m. will not be returned until the following business day.  For prescription refill requests, have your pharmacy contact our office.       Resources For Cancer Patients and their Caregivers ? American Cancer Society: Can assist with transportation, wigs, general needs, runs Look Good Feel Better.        856-562-3208 ? Cancer Care: Provides financial assistance, online support groups, medication/co-pay assistance.  1-800-813-HOPE 318-319-5402) ? [place] Assists  [place] cancer patients and their families through emotional , educational and financial support.  380-514-7226 ? [place] DSS Where to apply for food stamps, Medicaid and utility assistance. 8107560066 ? [employer]: Transportation to medical appointments. 209-452-0256 ? Social Security Administration: May apply for disability if have a Stage IV cancer. 406-616-9417 732-715-1278 ? [employer], Disability and Transit Services: Assists with nutrition, care and transit needs. [place] Support Programs: @10RELATIVEDAYS @ > Cancer Support Group  2nd Tuesday of the month 1pm-2pm, Journey Room  > Creative Journey  3rd Tuesday of the month 1130am-1pm, Journey Room  > Look Good Feel Better  1st Wednesday of the month 10am-12 noon, Journey Room (Call [place] to register [phone])

## 2017-05-03 NOTE — Progress Notes (Signed)
[name] port flushed with nacl 9ml and heparin 500units (42ml) with no complaints of pain at site.  No bruising or swelling noted.  Tolerated without difficulty.  Labs drawn from port.  Discharged with VSS and ambulatory.

## 2017-05-24 ENCOUNTER — Ambulatory Visit: Payer: Self-pay | Admitting: Genetic Counselor

## 2017-05-24 ENCOUNTER — Telehealth: Payer: Self-pay | Admitting: Genetic Counselor

## 2017-05-24 ENCOUNTER — Encounter: Payer: Self-pay | Admitting: Genetic Counselor

## 2017-05-24 DIAGNOSIS — Z1379 Encounter for other screening for genetic and chromosomal anomalies: Secondary | ICD-10-CM | POA: Insufficient documentation

## 2017-05-24 DIAGNOSIS — C210 Malignant neoplasm of anus, unspecified: Secondary | ICD-10-CM

## 2017-05-24 DIAGNOSIS — Z8543 Personal history of malignant neoplasm of ovary: Secondary | ICD-10-CM

## 2017-05-24 NOTE — Telephone Encounter (Signed)
Revealed negative genetic testing.  Discussed that we do not know why she had ovarian cancer. It could be due to a different gene that we are not testing, or maybe our current technology may not be able to pick something up.  It will be important for her to keep in contact with genetics to keep up with whether additional testing may be needed.  Discussed that HRD testing was also negative.

## 2017-05-24 NOTE — Progress Notes (Addendum)
HPI: Ms. Kerekes was previously seen in the [place] clinic due to a personal history of ovarian and anal cancer and concerns regarding a hereditary predisposition to cancer. Please refer to our prior cancer genetics clinic note for more information regarding Ms. Borland's medical, social and family histories, and our assessment and recommendations, at the time. Ms. Odriscoll recent genetic test results were disclosed to her, as were recommendations warranted by these results. These results and recommendations are discussed in more detail below.  CANCER HISTORY:    History of ovarian cancer   06/02/2014 Procedure    Diagnostic laparoscopy, lysis of adhesions, biopsy of omental nodules, biopsy right ovary by Dr Dalbert Batman      06/04/2014 Pathology Results    Diagnosis 1. Omentum, biopsy, Anterior peritoneal & omental nodule - SEROUS CARCINOMA WITH ASSOCIATED ABUNDANT PSAMMOMA BODIES AND DESMOPLASTIC STROMAL REACTION CONSISTENT WITH INVASIVE IMPLANTS. - SEE COMMENT. 2. Omentum, biopsy, Omental nodule - SEROUS CARCINOMA WITH ASSOCIATED ABUNDANT PSAMMOMA BODIES AND DESMOPLASTIC STROMAL REACTION CONSISTENT WITH INVASIVE IMPLANTS. - SEE COMMENT. 3. Ovary, biopsy/wedge resection, Right ovary - SMALL FRAGMENTS OF ATYPICAL PAPILLARY PROLIFERATION WITH PSAMMOMA BODIES CONSISTENT WITH AT LEAST SEROUS BORDERLINE TUMOR.      07/03/2014 Procedure    Exploratory laparotomy, omentectomy, Bilateral salpingo-oophorectomy, inptraperitoneal PORT placement by Dr. Alycia Rossetti          07/24/2014 - 12/07/2014 Chemotherapy    Carboplatin/Paclitaxel x 6 cycles       08/17/2014 Procedure    Insertion of 8 French power port clear view tunneled venous vascular access device next line use of fluoroscopy for guidance and positioning Use of ultrasound for venipuncture Removal of intraperitoneal chemotherapy port By Dr. Dalbert Batman      05/14/2017 Genetic Testing    Negative genetic testing on the [facility] panel.   The [facility] gene panel offered by [employer] includes sequencing and deletion/duplication testing of the following 28 genes: APC, ATM, BARD1, BMPR1A, BRCA1, BRCA2, BRIP1, CHD1, CDK4, CDKN2A, CHEK2, EPCAM (large rearrangement only), MLH1, MSH2, MSH6, MUTYH, NBN, PALB2, PMS2, PTEN, RAD51C, RAD51D, SMAD4, STK11, and TP53. Sequencing was performed for select regions of POLE and POLD1, and large rearrangement analysis was performed for select regions of GREM1. The report date is April 27, 2017.  HRD testing was performed on the ovarian tumor.  The tumor was negative for any deleterious BRCA1 or BRCA2 mutations.    Genomic instability status was not interpretable.  Therefore, [facility] was unable to analyze the ovarian tumor for genomic instability.  The report date is May 14, 2017.        Anal cancer ([place])   09/09/2015 Procedure    Rectovaginal septal mass biopsy by Dr. Marcello Moores      09/10/2015 Pathology Results    Soft tissue mass, biopsy, rectovaginal septal mass - SQUAMOUS CELL CARCINOMA.      10/15/2015 PET scan    1. Focal hypermetabolism with associated ill-defined soft tissue fullness at the junction of the anterior anal wall and posterior inferior vaginal wall, in keeping with the provided history of a primary anal carcinoma. 2. No hypermetabolic locoregional or distant metastatic disease. 3. Status post hysterectomy, with no abnormal findings at the vaginal cuff. No evidence of hypermetabolic peritoneal tumor. 4. Tiny nonobstructing bilateral renal stones.      10/25/2015 - 12/06/2015 Radiation Therapy    [place]      10/25/2015 - 12/06/2015 Chemotherapy    Mitomycin C and 5FU, by Dr. Jacquiline Doe  06/15/2016 Procedure    EXCISION RECTOVAGINAOL FISTULA WITH MUCOSAL ADVANCEMENT FLAP by Dr. Marcello Moores      06/18/2016 Pathology Results    Fistula, rectovaginal SQUAMOUS, COLUMNAR JUNCTION MUCOSA AND SUBCUTANOUS SOFT TISSUE WITH FISTULA NO EVIDENCE OF  MALIGNANCY      09/20/2016 Imaging    CT CAP- 1. No acute findings and no evidence for recurrent tumor or metastatic disease. 2. Aortic atherosclerosis and coronary artery calcification       FAMILY HISTORY:  We obtained a detailed, 4-generation family history.  Significant diagnoses are listed below: Family History  Problem Relation Age of Onset  . Bipolar disorder Mother   . Anxiety disorder Mother   . Dementia Mother   . Depression Mother   . Mental illness Mother   . Vision loss Mother   . Alzheimer's disease Mother   . Alcohol abuse Paternal Uncle   . Bipolar disorder Maternal Grandmother   . Dementia Maternal Grandmother   . Alzheimer's disease Maternal Grandmother   . Alcohol abuse Paternal Uncle   . Stroke Brother   . Deep vein thrombosis Son   . Heart disease Father   . Hyperlipidemia Father   . Hypertension Father   . Stroke Father   . Vision loss Father   . Atrial fibrillation Sister   . Heart disease Brother   . Heart disease Brother   . ADD / ADHD Neg Hx   . Drug abuse Neg Hx   . OCD Neg Hx   . Paranoid behavior Neg Hx   . Schizophrenia Neg Hx   . Seizures Neg Hx   . Sexual abuse Neg Hx   . Physical abuse Neg Hx     The patient has one daughter and two sons who are cancer free.  She has three brothers and one sister who are all cancer free.  One brother died of a massive stroke.  Both parents are deceased.  Her mother died from complications of dementia and her father died from a massive stroke.  Her mother had two sisters, who are alive, and two brothers.  None had cancer.  Her maternal grandparents are deceased.  The patient's father had two brothers and two sisters who are all deceased.  No reported family history of cancer on the paternal side.  Ms. Nonaka is unaware of previous family history of genetic testing for hereditary cancer risks. Patient's maternal ancestors are of [place] descent, and paternal ancestors are of [place] descent.  There is no reported Ashkenazi Jewish ancestry. There is no known consanguinity.  GENETIC TEST RESULTS: Genetic testing reported out on April 27, 2017 through the [facility] cancer panel found no deleterious mutations.  The [facility] gene panel offered by [employer] includes sequencing and deletion/duplication testing of the following 28 genes: APC, ATM, BARD1, BMPR1A, BRCA1, BRCA2, BRIP1, CHD1, CDK4, CDKN2A, CHEK2, EPCAM (large rearrangement only), MLH1, MSH2, MSH6, MUTYH, NBN, PALB2, PMS2, PTEN, RAD51C, RAD51D, SMAD4, STK11, and TP53. Sequencing was performed for select regions of POLE and POLD1, and large rearrangement analysis was performed for select regions of GREM1.   The test report has been scanned into [employer] and is located under the Molecular Pathology section of the Results Review tab.   We discussed with Ms. Montesinos that since the current genetic testing is not perfect, it is possible there may be a gene mutation in one of these genes that current testing cannot detect, but that chance is small. We also discussed, that it is possible  that another gene that has not yet been discovered, or that we have not yet tested, is responsible for the cancer diagnoses in the family, and it is, therefore, important to remain in touch with cancer genetics in the future so that we can continue to offer Ms. Elms the most up to date genetic testing.   Genetic testing reported out on May 14, 2017 through the Homologous Repair Deficiency (HRD) testing found no deleterious BRCA1 or BRCA2 mutations.  HRD can be indicated by the presence of a BRCA1 or BRCA2 mutation as well as genomic instability.  Testing for genomic instability was not able to be performed.  Ultimately, no HRD was identified on this patient's tumor.       CANCER SCREENING RECOMMENDATIONS:  This result is reassuring and indicates that Ms. Silverio likely does not have an increased risk for a future cancer due to a mutation in one of these  genes. This normal test also suggests that Ms. Etienne's cancer was most likely not due to an inherited predisposition associated with one of these genes.  Most cancers happen by chance and this negative test suggests that her cancer falls into this category.  We, therefore, recommended she continue to follow the cancer management and screening guidelines provided by her oncology and primary healthcare provider.   RECOMMENDATIONS FOR FAMILY MEMBERS: Women in this family might be at some increased risk of developing cancer, over the general population risk, simply due to the family history of cancer. We recommended women in this family have a yearly mammogram beginning at age 43, or 83 years younger than the earliest onset of cancer, an annual clinical breast exam, and perform monthly breast self-exams. Women in this family should also have a gynecological exam as recommended by their primary provider. All family members should have a colonoscopy by age 79.  FOLLOW-UP: Lastly, we discussed with Ms. Radin that cancer genetics is a rapidly advancing field and it is possible that new genetic tests will be appropriate for her and/or her family members in the future. We encouraged her to remain in contact with cancer genetics on an annual basis so we can update her personal and family histories and let her know of advances in cancer genetics that may benefit this family.   Our contact number was provided. Ms. Gunnoe questions were answered to her satisfaction, and she knows she is welcome to call us at anytime with additional questions or concerns.   Roma Kayser, MS, [facility] Certified Genetic Counselor [email or web address]

## 2017-06-04 ENCOUNTER — Ambulatory Visit (HOSPITAL_COMMUNITY): Payer: Self-pay

## 2017-06-04 ENCOUNTER — Other Ambulatory Visit (HOSPITAL_COMMUNITY): Payer: Self-pay | Admitting: Adult Health

## 2017-06-04 DIAGNOSIS — Z1231 Encounter for screening mammogram for malignant neoplasm of breast: Secondary | ICD-10-CM

## 2017-06-19 ENCOUNTER — Encounter: Payer: Self-pay | Admitting: Family Medicine

## 2017-06-19 ENCOUNTER — Ambulatory Visit (INDEPENDENT_AMBULATORY_CARE_PROVIDER_SITE_OTHER): Payer: Medicare Other | Admitting: Family Medicine

## 2017-06-19 DIAGNOSIS — C569 Malignant neoplasm of unspecified ovary: Secondary | ICD-10-CM | POA: Diagnosis not present

## 2017-06-19 DIAGNOSIS — C210 Malignant neoplasm of anus, unspecified: Secondary | ICD-10-CM

## 2017-06-19 MED ORDER — PREDNISONE 20 MG PO TABS: 20.0000 mg | ORAL_TABLET | Freq: Two times a day (BID) | ORAL | 0 refills | Status: DC

## 2017-06-19 MED ORDER — VALACYCLOVIR HCL 1 G PO TABS: 1000.0000 mg | ORAL_TABLET | Freq: Every day | ORAL | 11 refills | Status: DC | PRN

## 2017-06-19 MED ORDER — AZITHROMYCIN 250 MG PO TABS: ORAL_TABLET | ORAL | 0 refills | Status: DC

## 2017-06-19 NOTE — Patient Instructions (Signed)
Push fluids Take the antibiotic and prednisone as directed Call if not better by  Monday

## 2017-06-19 NOTE — Progress Notes (Signed)
Chief Complaint  Patient presents with  . URI   Cough and cold symptoms worsening over a week Woke up in a sweat last night - didn't take temp Is wheezing and coughing up yellow-green sputum.  Very tired.  Mild sore throat and sinus pressure.   Feels her immunity is low due to cancer/treatment.  CBC last month looks good. No known COPD but smoked many years, hyperinflation and scarring mentioned on prior imaging  Patient Active Problem List   Diagnosis Date Noted  . Genetic testing 05/24/2017  . HLD (hyperlipidemia) 02/07/2017  . Normocytic normochromic anemia 01/10/2017  . GERD (gastroesophageal reflux disease) 09/27/2016  . Back pain 09/27/2016  . Anxiety 09/27/2016  . Allergy 09/27/2016  . Radiation proctitis 12/23/2015  . Anal cancer ([place]) 11/19/2015  . History of ovarian cancer 08/03/2015  . Prolapse of vaginal vault after hysterectomy 07/20/2015  . Migraines 05/20/2015  . Port-a-cath in place 01/13/2015  . Epithelial ovarian cancer, FIGO stage IIIA ([place]) 07/16/2014  . Abdominal carcinomatosis ([place]) 06/18/2014  . Bipolar 1 disorder ([place]) 12/27/2011  . Kidney stones 12/27/2011  . IBS (irritable colon syndrome) 12/27/2011  . Depression 11/07/2011    Outpatient Encounter Prescriptions as of 06/19/2017  Medication Sig  . Aspirin-Salicylamide-Caffeine (BC HEADACHE POWDER PO) Take 1 packet by mouth as needed.   Marland Kitchen b complex vitamins tablet Take 1 tablet by mouth daily.  Marland Kitchen dicyclomine (BENTYL) 10 MG capsule 10 to 20 mg by mouth 4 times daily as needed  . divalproex (DEPAKOTE ER) 500 MG 24 hr tablet Take 1 tablet (500 mg total) by mouth daily.  Marland Kitchen escitalopram (LEXAPRO) 20 MG tablet Take 1 tablet (20 mg total) by mouth 2 (two) times daily.  . IRON PO Take by mouth.  . Iron Polysacch Cmplx-B12-FA (POLY-IRON 150 FORTE PO) Take 150 mg by mouth daily.  Marland Kitchen KRILL OIL PO Take 350 mg by mouth daily.  . lansoprazole (PREVACID) 15 MG capsule Take 15 mg by mouth as needed.   Marland Kitchen  LORazepam (ATIVAN) 1 MG tablet Take 1 tablet (1 mg total) by mouth 3 (three) times daily as needed for anxiety.  . meloxicam (MOBIC) 15 MG tablet Take 1 tablet (15 mg total) by mouth daily.  . Multiple Vitamin (MULTIVITAMIN) tablet Take 1 tablet by mouth daily.  . valACYclovir (VALTREX) 1000 MG tablet Take 1 tablet (1,000 mg total) by mouth daily as needed.  Marland Kitchen azithromycin (ZITHROMAX Z-PAK) 250 MG tablet tad  . predniSONE (DELTASONE) 20 MG tablet Take 1 tablet (20 mg total) by mouth 2 (two) times daily with a meal.   No facility-administered encounter medications on file as of 06/19/2017.     No Known Allergies  Review of Systems  Constitutional: Positive for activity change, appetite change, diaphoresis and fever. Negative for chills.  HENT: Positive for congestion, mouth sores, postnasal drip, rhinorrhea and sinus pressure. Negative for sore throat.   Eyes: Negative for redness and visual disturbance.  Respiratory: Positive for cough, chest tightness, shortness of breath and wheezing.   Cardiovascular: Negative for chest pain and palpitations.  Gastrointestinal: Negative for constipation, diarrhea, nausea and vomiting.  Genitourinary: Negative for dysuria and flank pain.  Musculoskeletal: Negative for myalgias.  Neurological: Negative for dizziness and headaches.    BP 138/82   Pulse 85   Temp 98.6 F (37 C) (Oral)   Ht 5\' 5"  (1.651 m)   Wt 144 lb (65.3 kg)   SpO2 98%   BMI 23.96 kg/m   Physical  Exam  Constitutional: She appears well-developed and well-nourished.  Mildly ill, fatigued  HENT:  Head: Normocephalic and atraumatic.  Right Ear: External ear normal.  Left Ear: External ear normal.  Mouth/Throat: Oropharynx is clear and moist.  Eyes: Pupils are equal, round, and reactive to light. EOM are normal.  Neck: Normal range of motion. Neck supple. No thyromegaly present.  Cardiovascular: Normal rate, regular rhythm and normal heart sounds.   Pulmonary/Chest: Effort  normal. She has wheezes.  Scattered end insp wheeze, few rhonchi  Abdominal: Soft. Bowel sounds are normal. There is no tenderness.  Musculoskeletal: Normal range of motion. She exhibits no edema.  No clubbing, cyanosis, or edema  Lymphadenopathy:    She has cervical adenopathy.  Psychiatric: She has a normal mood and affect. Her behavior is normal.  Mildly anxious    ASSESSMENT/PLAN:  1. Acute bronchitis Am covering with antibiotic/prednisone due to suspected underlying COPD  2. Anal cancer ([place]) - valACYclovir (VALTREX) 1000 MG tablet; Take 1 tablet (1,000 mg total) by mouth daily as needed.  Dispense: 5 tablet; Refill: 11  3. Epithelial ovarian cancer, FIGO stage IIIA ([facility]) - valACYclovir (VALTREX) 1000 MG tablet; Take 1 tablet (1,000 mg total) by mouth daily as needed.  Dispense: 5 tablet; Refill: 11   Patient Instructions  Push fluids Take the antibiotic and prednisone as directed Call if not better by  Monday   Raylene Everts, MD

## 2017-06-25 ENCOUNTER — Telehealth (HOSPITAL_COMMUNITY): Payer: Self-pay | Admitting: *Deleted

## 2017-06-25 ENCOUNTER — Ambulatory Visit (HOSPITAL_COMMUNITY): Payer: Self-pay | Admitting: Psychiatry

## 2017-06-25 NOTE — Telephone Encounter (Signed)
left voice message, provider out of office. 

## 2017-06-25 NOTE — Telephone Encounter (Signed)
Appointment resolved.  No further follow up needed.

## 2017-06-29 ENCOUNTER — Encounter (HOSPITAL_COMMUNITY): Payer: Self-pay

## 2017-06-29 ENCOUNTER — Encounter (HOSPITAL_COMMUNITY): Payer: Medicare Other | Attending: Oncology

## 2017-06-29 DIAGNOSIS — Z452 Encounter for adjustment and management of vascular access device: Secondary | ICD-10-CM | POA: Diagnosis not present

## 2017-06-29 DIAGNOSIS — Z85048 Personal history of other malignant neoplasm of rectum, rectosigmoid junction, and anus: Secondary | ICD-10-CM

## 2017-06-29 DIAGNOSIS — C210 Malignant neoplasm of anus, unspecified: Secondary | ICD-10-CM | POA: Insufficient documentation

## 2017-06-29 DIAGNOSIS — Z8543 Personal history of malignant neoplasm of ovary: Secondary | ICD-10-CM | POA: Diagnosis not present

## 2017-06-29 DIAGNOSIS — C569 Malignant neoplasm of unspecified ovary: Secondary | ICD-10-CM | POA: Insufficient documentation

## 2017-06-29 MED ORDER — HEPARIN SOD (PORK) LOCK FLUSH 100 UNIT/ML IV SOLN
500.0000 [IU] | Freq: Once | INTRAVENOUS | Status: AC
  Administered 2017-06-29: 500 [IU] via INTRAVENOUS
  Filled 2017-06-29: qty 5

## 2017-06-29 MED ORDER — SODIUM CHLORIDE 0.9% FLUSH
10.0000 mL | INTRAVENOUS | Status: DC | PRN
  Administered 2017-06-29: 10 mL via INTRAVENOUS
  Filled 2017-06-29: qty 10

## 2017-06-29 NOTE — Progress Notes (Signed)
[name] presented for Portacath access and flush.  Portacath located right chest wall accessed with  H 20 needle.  Good blood return present. Portacath flushed with 101ml NS and 500U/66ml Heparin and needle removed intact.  Procedure tolerated well and without incident.  Discharged ambulatory.

## 2017-07-06 ENCOUNTER — Encounter (HOSPITAL_COMMUNITY): Payer: Self-pay | Admitting: Psychiatry

## 2017-07-06 ENCOUNTER — Ambulatory Visit (INDEPENDENT_AMBULATORY_CARE_PROVIDER_SITE_OTHER): Payer: Medicare Other | Admitting: Psychiatry

## 2017-07-06 VITALS — BP 144/67 | HR 80 | Ht 65.0 in | Wt 145.0 lb

## 2017-07-06 DIAGNOSIS — F329 Major depressive disorder, single episode, unspecified: Secondary | ICD-10-CM

## 2017-07-06 DIAGNOSIS — F419 Anxiety disorder, unspecified: Secondary | ICD-10-CM | POA: Diagnosis not present

## 2017-07-06 DIAGNOSIS — F331 Major depressive disorder, recurrent, moderate: Secondary | ICD-10-CM

## 2017-07-06 DIAGNOSIS — Z79899 Other long term (current) drug therapy: Secondary | ICD-10-CM | POA: Diagnosis not present

## 2017-07-06 DIAGNOSIS — Z818 Family history of other mental and behavioral disorders: Secondary | ICD-10-CM | POA: Diagnosis not present

## 2017-07-06 MED ORDER — ESCITALOPRAM OXALATE 20 MG PO TABS: 20.0000 mg | ORAL_TABLET | Freq: Two times a day (BID) | ORAL | 2 refills | Status: DC

## 2017-07-06 MED ORDER — LORAZEPAM 1 MG PO TABS: 1.0000 mg | ORAL_TABLET | Freq: Three times a day (TID) | ORAL | 3 refills | Status: DC | PRN

## 2017-07-06 MED ORDER — DIVALPROEX SODIUM ER 500 MG PO TB24: 500.0000 mg | ORAL_TABLET | Freq: Every day | ORAL | 2 refills | Status: DC
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
## 2017-07-11 ENCOUNTER — Ambulatory Visit (HOSPITAL_COMMUNITY)
Admission: RE | Admit: 2017-07-11 | Discharge: 2017-07-11 | Disposition: A | Discharge status: Home / Self Care | Payer: Medicare Other | Source: Ambulatory Visit | Attending: Adult Health | Admitting: Adult Health

## 2017-07-11 DIAGNOSIS — Z1231 Encounter for screening mammogram for malignant neoplasm of breast: Secondary | ICD-10-CM | POA: Diagnosis not present

## 2017-07-16 DIAGNOSIS — Z85048 Personal history of other malignant neoplasm of rectum, rectosigmoid junction, and anus: Secondary | ICD-10-CM | POA: Diagnosis not present

## 2017-08-30 NOTE — Progress Notes (Signed)
[place], [place] [address]   CLINIC:  Medical Oncology/Hematology  PCP:  Raylene Everts, MD [phone] [address] [place] [address] [phone]   REASON FOR VISIT:  Follow-up for Stage II anal cancer AND Stage III ovarian cancer AND iron deficiency anemia   CURRENT THERAPY: Surveillance per NCCN Guidelines for both malignancies AND Ferrex forte po daily    BRIEF ONCOLOGIC HISTORY:    History of ovarian cancer   06/02/2014 Procedure    Diagnostic laparoscopy, lysis of adhesions, biopsy of omental nodules, biopsy right ovary by Dr Dalbert Batman      06/04/2014 Pathology Results    Diagnosis 1. Omentum, biopsy, Anterior peritoneal & omental nodule - SEROUS CARCINOMA WITH ASSOCIATED ABUNDANT PSAMMOMA BODIES AND DESMOPLASTIC STROMAL REACTION CONSISTENT WITH INVASIVE IMPLANTS. - SEE COMMENT. 2. Omentum, biopsy, Omental nodule - SEROUS CARCINOMA WITH ASSOCIATED ABUNDANT PSAMMOMA BODIES AND DESMOPLASTIC STROMAL REACTION CONSISTENT WITH INVASIVE IMPLANTS. - SEE COMMENT. 3. Ovary, biopsy/wedge resection, Right ovary - SMALL FRAGMENTS OF ATYPICAL PAPILLARY PROLIFERATION WITH PSAMMOMA BODIES CONSISTENT WITH AT LEAST SEROUS BORDERLINE TUMOR.      07/03/2014 Procedure    Exploratory laparotomy, omentectomy, Bilateral salpingo-oophorectomy, inptraperitoneal PORT placement by Dr. Alycia Rossetti          07/24/2014 - 12/07/2014 Chemotherapy    Carboplatin/Paclitaxel x 6 cycles       08/17/2014 Procedure    Insertion of 8 French power port clear view tunneled venous vascular access device next line use of fluoroscopy for guidance and positioning Use of ultrasound for venipuncture Removal of intraperitoneal chemotherapy port By Dr. Dalbert Batman      05/14/2017 Genetic Testing    Negative genetic testing on the [facility] panel.  The [facility] gene panel offered by [employer] includes sequencing and deletion/duplication testing of the  following 28 genes: APC, ATM, BARD1, BMPR1A, BRCA1, BRCA2, BRIP1, CHD1, CDK4, CDKN2A, CHEK2, EPCAM (large rearrangement only), MLH1, MSH2, MSH6, MUTYH, NBN, PALB2, PMS2, PTEN, RAD51C, RAD51D, SMAD4, STK11, and TP53. Sequencing was performed for select regions of POLE and POLD1, and large rearrangement analysis was performed for select regions of GREM1. The report date is April 27, 2017.  HRD testing was performed on the ovarian tumor.  The tumor was negative for any deleterious BRCA1 or BRCA2 mutations.    Genomic instability status was not interpretable.  Therefore, [facility] was unable to analyze the ovarian tumor for genomic instability.  The report date is May 14, 2017.        Anal cancer ([place])   09/09/2015 Procedure    Rectovaginal septal mass biopsy by Dr. Marcello Moores      09/10/2015 Pathology Results    Soft tissue mass, biopsy, rectovaginal septal mass - SQUAMOUS CELL CARCINOMA.      10/15/2015 PET scan    1. Focal hypermetabolism with associated ill-defined soft tissue fullness at the junction of the anterior anal wall and posterior inferior vaginal wall, in keeping with the provided history of a primary anal carcinoma. 2. No hypermetabolic locoregional or distant metastatic disease. 3. Status post hysterectomy, with no abnormal findings at the vaginal cuff. No evidence of hypermetabolic peritoneal tumor. 4. Tiny nonobstructing bilateral renal stones.      10/25/2015 - 12/06/2015 Radiation Therapy    [place], [place]      10/25/2015 - 12/06/2015 Chemotherapy    Mitomycin C and 5FU, by Dr. Jacquiline Doe       06/15/2016 Procedure    EXCISION RECTOVAGINAOL FISTULA WITH MUCOSAL  ADVANCEMENT FLAP by Dr. Marcello Moores      06/18/2016 Pathology Results    Fistula, rectovaginal SQUAMOUS, COLUMNAR JUNCTION MUCOSA AND SUBCUTANOUS SOFT TISSUE WITH FISTULA NO EVIDENCE OF MALIGNANCY      09/20/2016 Imaging    CT CAP- 1. No acute findings and no evidence for recurrent tumor or metastatic  disease. 2. Aortic atherosclerosis and coronary artery calcification        INTERVAL HISTORY:  Abigail Wagner 66 y.o. female returns for routine follow-up for history of anal cancer, ovarian cancer, and iron deficiency anemia.   Here unaccompanied today.   Overall, she tells me that she has been feeling well. Appetite 100%; energy levels 50%.  Chart reviewed; weight is largely stable.  Denies any pain. She tells me that she recently had a cold and has some associated dizziness at times. Reports intermittent fatigue; "I feel like I'm getting stronger, but I tire easily."  Denies any cough or shortness of breath. Denies any abdominal pain, blood in her stools, pelvic pain/pressure, dysuria, hematuria, vaginal bleeding, or dyspareunia.  Reports that she had an episode of urinary incontinence last night. "I woke up suddenly in the middle of the night and peed on the floor on the way to the bathroom."  She was told by her gynecologist that she has some prolapse.  She states that she performs pelvic floor exercises often.    She last saw Dr. Marcello Moores with GI surgery ~4-6 weeks ago for DRE and follow-up visit. "She told me everything looked good."    Remains on ferrex forte oral iron. She is tolerating this well. Denies any constipation or GI distress with this medication. She thinks it may have helped her energy some in the past.   She sees her PCP, Dr. Meda Coffee, regularly.     Otherwise, she is largely without complaints today.  She wants to know if she needs any additional CT scans. "I hope I don't because I hate taking those scans. I have diarrhea for several days from that contrast."     REVIEW OF SYSTEMS:  Review of Systems  Constitutional: Positive for fatigue.  HENT:  Negative.   Eyes: Negative.   Respiratory: Negative.  Negative for cough and shortness of breath.   Cardiovascular: Positive for leg swelling.  Gastrointestinal: Negative for abdominal pain, blood in stool, constipation, diarrhea,  nausea and vomiting.       Reports 1 episode of N&V several weeks ago, but she and her husband were both sick with similar symptoms. This has since resolved and she has had no recurrent N&V symptoms.   Endocrine: Negative.   Genitourinary: Positive for bladder incontinence (urge incontinence). Negative for dyspareunia, dysuria, pelvic pain and vaginal bleeding.   Musculoskeletal: Negative.   Skin: Negative.   Neurological: Positive for dizziness. Negative for headaches.  Hematological: Negative.   Psychiatric/Behavioral: Negative.      PAST MEDICAL/SURGICAL HISTORY:  Past Medical History:  Diagnosis Date  . Allergy   . Anal carcinoma [facility]) oncologist-  dr Whitney Muse ([facility])   dx 12/ 2016 [facility] --  completed therapy  03/ 2017  . Anemia   . Anxiety   . Blood transfusion without reported diagnosis   . Carcinoma of ovary, stage 3 [facility]) oncologist-  dr gehrig/  dr Doreene Eland (cancer center in [place])--  no recurrency   dx 09/ 2015  Stage IIIB  papillary ovarian carcinoma  s/p  omentectomy and BSO &  chemotherapy (completed 12-07-2014)  . Chemotherapy induced nausea  and vomiting 10/23/2014  . Chronic kidney disease    stones  . DDD (degenerative disc disease), lumbosacral   . GERD (gastroesophageal reflux disease)   . Headache(784.0)   . History of acute respiratory failure    05-30-2009  drug overdose-- (intubated for 2 days)  and aspiration pneumonia  . History of adenomatous polyp of colon 10/07/2015   tubular adenoma high grade dysplasia  . History of herpes genitalis   . History of kidney stones   . History of ovarian cancer 08/03/2015  . History of suicide attempt    per documentation in epic  05-30-2009  overdose benaodiazepine  . HTN (hypertension)    takes Metoprolol daily  . Hyperlipidemia   . Hypertension 09/27/2016  . IBS (irritable bowel syndrome)    takes Bentyl daily  . Major depression   . Mood disorder ([place])   . OA (osteoarthritis)   . Prolapse of  vaginal vault after hysterectomy 07/20/2015  . Rectovaginal fistula 08/05/2015  . Sigmoid diverticulosis   . Smokers' cough [facility])    Past Surgical History:  Procedure Laterality Date  . BREAST ENHANCEMENT SURGERY  1986  . [place]  . COLONOSCOPY N/A 10/07/2015   Procedure: COLONOSCOPY;  Surgeon: Rogene Houston, MD;  [facility];  Service: Endoscopy;  Laterality: N/A;  7:30  . EXPLORATORY LAPAROTOMY/ OMENTECTOMY/  BILATERAL SALPINGOOPHORECTOMY/  [facility] PLACEMENT  07-03-2014   [facility]  . KNEE ARTHROSCOPY Left 09-22-2004  . LAPAROSCOPY N/A 06/02/2014   Procedure: DIAGNOSTIC LAPAROSCOPY, OMENTAL BIOPSY, RIGHT OVARY BIOPSY, LYSIS OF ADHESIONS;  Surgeon: Fanny Skates, MD;  Location: [place];  Service: General;  Laterality: N/A;  . MUCOSAL ADVANCEMENT FLAP N/A 06/15/2016   Procedure: EXCISION RECTOVAGINAOL FISTULA WITH MUCOSAL ADVANCEMENT FLAP;  Surgeon: Leighton Ruff, MD;  Location: [place];  Service: General;  Laterality: N/A;  . PLACEMENT OF SETON N/A 09/09/2015   Procedure: PLACEMENT OF SETON;  Surgeon: Leighton Ruff, MD;  Location: [place];  Service: General;  Laterality: N/A;  . PORT-A-CATH REMOVAL Right 08/17/2014   Procedure: REMOVAL INTRAPERITONEAL CHEMO PORT;  Surgeon: Fanny Skates, MD;  Location: [place];  Service: General;  Laterality: Right;  . PORTACATH PLACEMENT Right 08/17/2014   Procedure:  PLACE NEW PORT A CATH;  Surgeon: Fanny Skates, MD;  Location: [place];  Service: General;  Laterality: Right;  . RECTAL BIOPSY N/A 09/09/2015   Procedure: BIOPSY OF RECTOVAGINAL MASS;  Surgeon: Leighton Ruff, MD;  Location: [place];  Service: General;  Laterality: N/A;  . TUBAL LIGATION  YRS AGO  . VAGINAL HYSTERECTOMY  1981   fibroids     SOCIAL HISTORY:  Social History   Socioeconomic History  . Marital status: Married    Spouse name: Coralyn Mark  . Number of  children: 3  . Years of education: 53  . Highest education level: Not on file  Social Needs  . Financial resource strain: Not on file  . Food insecurity - worry: Not on file  . Food insecurity - inability: Not on file  . Transportation needs - medical: Not on file  . Transportation needs - non-medical: Not on file  Occupational History  . Occupation: retire    Comment: [employer]  Tobacco Use  . Smoking status: Former Smoker    Packs/day: 1.00    Years: 43.00    Pack years: 43.00    Types: Cigarettes    Last attempt to quit:  05/24/2016    Years since quitting: 1.2  . Smokeless tobacco: Never Used  Substance and Sexual Activity  . Alcohol use: No  . Drug use: No  . Sexual activity: Not Currently    Birth control/protection: Surgical  Other Topics Concern  . Not on file  Social History Narrative   Lives at home with terry   Retired [employer]    FAMILY HISTORY:  Family History  Problem Relation Age of Onset  . Bipolar disorder Mother   . Anxiety disorder Mother   . Dementia Mother   . Depression Mother   . Mental illness Mother   . Vision loss Mother   . Alzheimer's disease Mother   . Alcohol abuse Paternal Uncle   . Bipolar disorder Maternal Grandmother   . Dementia Maternal Grandmother   . Alzheimer's disease Maternal Grandmother   . Alcohol abuse Paternal Uncle   . Stroke Brother   . Deep vein thrombosis Son   . Heart disease Father   . Hyperlipidemia Father   . Hypertension Father   . Stroke Father   . Vision loss Father   . Atrial fibrillation Sister   . Heart disease Brother   . Heart disease Brother   . ADD / ADHD Neg Hx   . Drug abuse Neg Hx   . OCD Neg Hx   . Paranoid behavior Neg Hx   . Schizophrenia Neg Hx   . Seizures Neg Hx   . Sexual abuse Neg Hx   . Physical abuse Neg Hx     CURRENT MEDICATIONS:  Outpatient Encounter Medications as of 08/31/2017  Medication Sig Note  . Aspirin-Salicylamide-Caffeine (BC HEADACHE POWDER PO) Take 1 packet  by mouth as needed.  07/16/2014: Received from: [facility]  . b complex vitamins tablet Take 1 tablet by mouth daily.   Marland Kitchen dicyclomine (BENTYL) 10 MG capsule 10 to 20 mg by mouth 4 times daily as needed   . divalproex (DEPAKOTE ER) 500 MG 24 hr tablet Take 1 tablet (500 mg total) by mouth daily.   Marland Kitchen escitalopram (LEXAPRO) 20 MG tablet Take 1 tablet (20 mg total) by mouth 2 (two) times daily.   . IRON PO Take by mouth.   . Iron Polysacch Cmplx-B12-FA (POLY-IRON 150 FORTE PO) Take 150 mg by mouth daily.   Marland Kitchen KRILL OIL PO Take 350 mg by mouth daily.   . lansoprazole (PREVACID) 15 MG capsule Take 15 mg by mouth as needed.    Marland Kitchen LORazepam (ATIVAN) 1 MG tablet Take 1 tablet (1 mg total) by mouth 3 (three) times daily as needed for anxiety.   . meloxicam (MOBIC) 15 MG tablet Take 1 tablet (15 mg total) by mouth daily.   . Multiple Vitamin (MULTIVITAMIN) tablet Take 1 tablet by mouth daily.   . valACYclovir (VALTREX) 1000 MG tablet Take 1 tablet (1,000 mg total) by mouth daily as needed.    No facility-administered encounter medications on file as of 08/31/2017.     ALLERGIES:  No Known Allergies   PHYSICAL EXAM:  ECOG Performance status: 0 - Asymptomatic   Vitals:   08/31/17 1303  BP: (!) 148/84  Pulse: 96  Resp: 18  Temp: 97.8 F (36.6 C)  SpO2: 98%   Filed Weights   08/31/17 1303  Weight: 143 lb 12.8 oz (65.2 kg)    Physical Exam  Constitutional: She is oriented to person, place, and time and well-developed, well-nourished, and in no distress.  HENT:  Head: Normocephalic.  Mouth/Throat: Oropharynx is clear and moist. No oropharyngeal exudate.  Eyes: Conjunctivae are normal. Pupils are equal, round, and reactive to light. No scleral icterus.  Neck: Normal range of motion. Neck supple.  Cardiovascular: Normal rate and regular rhythm.  Pulmonary/Chest: Effort normal and breath sounds normal. No respiratory distress.  Abdominal: Soft. Bowel sounds are normal. She exhibits no  distension. There is no tenderness. There is no rebound.  Musculoskeletal: Normal range of motion. She exhibits no edema.  Lymphadenopathy:    She has no cervical adenopathy.       Right: No inguinal and no supraclavicular adenopathy present.       Left: No inguinal and no supraclavicular adenopathy present.  Neurological: She is alert and oriented to person, place, and time. No cranial nerve deficit. Gait normal.  Skin: Skin is warm and dry. No rash noted.  Psychiatric: Mood, memory, affect and judgment normal.  Nursing note and vitals reviewed.    LABORATORY DATA:  I have reviewed the labs as listed.  CBC    Component Value Date/Time   WBC 5.0 05/02/2017 1430   RBC 3.98 05/02/2017 1430   HGB 11.9 (L) 05/02/2017 1430   HCT 35.2 (L) 05/02/2017 1430   PLT 226 05/02/2017 1430   MCV 88.4 05/02/2017 1430   MCH 29.9 05/02/2017 1430   MCHC 33.8 05/02/2017 1430   RDW 15.4 05/02/2017 1430   LYMPHSABS 1.4 05/02/2017 1430   MONOABS 0.3 05/02/2017 1430   EOSABS 0.2 05/02/2017 1430   BASOSABS 0.0 05/02/2017 1430   CMP Latest Ref Rng & Units 05/02/2017 01/10/2017 09/27/2016  Glucose 65 - 99 mg/dL 118(H) 90 106(H)  BUN 6 - 20 mg/dL '17 19 19  '$ Creatinine 0.44 - 1.00 mg/dL 0.54 0.51 0.54  Sodium 135 - 145 mmol/L 140 136 136  Potassium 3.5 - 5.1 mmol/L 3.8 3.9 3.8  Chloride 101 - 111 mmol/L 106 102 104  CO2 22 - 32 mmol/L '26 27 27  '$ Calcium 8.9 - 10.3 mg/dL 9.2 8.8(L) 8.9  Total Protein 6.5 - 8.1 g/dL 6.8 6.3(L) 6.1(L)  Total Bilirubin 0.3 - 1.2 mg/dL 0.4 0.2(L) 0.3  Alkaline Phos 38 - 126 U/L 50 54 54  AST 15 - 41 U/L '24 23 25  '$ ALT 14 - 54 U/L '18 19 24        '$ PENDING LABS:    DIAGNOSTIC IMAGING:  *The following radiologic images and reports have been reviewed independently and agree with below findings.  CT chest/abd/pelvis: 09/20/16 CLINICAL DATA:  Anal cancer follow-up  EXAM: CT CHEST, ABDOMEN, AND PELVIS WITH CONTRAST  TECHNIQUE: Multidetector CT imaging of the chest,  abdomen and pelvis was performed following the standard protocol during bolus administration of intravenous contrast.  CONTRAST:  30m ISOVUE-300 IOPAMIDOL (ISOVUE-300) INJECTION 61%  COMPARISON:  10/15/2015  FINDINGS: CT CHEST FINDINGS  Cardiovascular: The heart size is normal. There is no pericardial effusion peer aortic atherosclerosis is noted. Calcification within the LAD coronary artery noted.  Mediastinum/Nodes: No enlarged mediastinal, hilar, or axillary lymph nodes. Thyroid gland, trachea, and esophagus demonstrate no significant findings.  Lungs/Pleura: Lungs are clear. No pleural effusion or pneumothorax.  Musculoskeletal: No chest wall mass or suspicious bone lesions identified.  CT ABDOMEN PELVIS FINDINGS  Hepatobiliary: No focal liver abnormality is seen. No gallstones, gallbladder wall thickening, or biliary dilatation.  Pancreas: Unremarkable. No pancreatic ductal dilatation or surrounding inflammatory changes.  Spleen: Normal in size without focal abnormality.  Adrenals/Urinary Tract: Adrenal glands are unremarkable. Kidneys are normal, without renal  calculi, focal lesion, or hydronephrosis. Bladder is unremarkable.  Stomach/Bowel: Stomach is within normal limits. Appendix appears normal. No evidence of bowel wall thickening, distention, or inflammatory changes.  Vascular/Lymphatic: Aortic atherosclerosis. No aneurysm. There is no upper abdominal adenopathy identified. No pelvic or inguinal adenopathy.  Reproductive: Status post hysterectomy. No adnexal masses.  Other: No abdominal wall hernia or abnormality. No abdominopelvic ascites.  Musculoskeletal: No acute or significant osseous findings.  IMPRESSION: 1. No acute findings and no evidence for recurrent tumor or metastatic disease. 2. Aortic atherosclerosis and coronary artery calcification   Electronically Signed   By: Kerby Moors M.D.   On: 09/20/2016  09:52     PATHOLOGY:  Rectal path: 09/09/15    Omentum/ovary surgical path: 06/02/14         ASSESSMENT & PLAN:   Stage II anal cancer:  -Diagnosed in 08/2015. Treated with concurrent chemoradiation with 5-FU/Mitomycin C with curative intent. Completed treatment on 12/06/15. Last CT chest/abd/pelvis in 08/2016 negative for recurrent disease.    -Clinically, she is doing very well with no symptoms concerning for recurrence. Reviewed with her that based on NCCN Guidelines, she does not meet criteria for annual imaging, as she did not have T3/T4 lesions, nor did she have inguinal LN involvement.  We will obtain imaging in the future as clinically indicated with new or worrisome symptoms or abnormal physical exam findings. She is very enthusiastic about this plan, as the CT contrast gives her diarrhea.   -Continue routine follow-up with Dr. Marcello Moores with GI surgery for DRE and anoscopy as directed; last visit reportedly 4-6 weeks ago per patient; "she told me everything looked good."  -Since she continues to feel well, discussed option of lengthening time between follow-up visits here at the cancer center to every 6 months. She is enthusiastic about this plan.   -From an oncologic standpoint, she can have her port-a-cath removed. However, she wishes to keep her port going forward, which is reasonable.  Continue port flushes every 2 months.  -Return to cancer center in 6 months for follow-up with port flush/labs.       Stage III ovarian cancer:  -Diagnosed in 2015. Treated with debulking surgery with Dr. Alycia Rossetti, followed by [facility] chemotherapy x 6 cycles. Completed treatment on 12/07/14. CA-125 19.2 at time of diagnosis. Genetic testing in 04/2017 revealed no mutations.  -Clinically, she continues to feel very well. No symptoms concerning for recurrent disease.  She is now 3+ years out from her initial diagnosis without clinical symptoms for recurrent disease; this is very favorable.    -Will continue to periodically monitor CA-125; results pending for today.   -Given that she continues to feel well, will move her to follow-up visits every 6 months.  She agrees with this plan.      Health maintenance/Wellness promotion:  -Maintain follow-up with PCP as directed for health maintenance.  -Reports that she has already received her flu vaccine for this year.    Iron deficiency anemia:  -On oral iron with ferrex forte and tolerating well.  Denies any concerns with constipation or GI upset.  -She does have some mild fatigue, which may be indicative of worsening iron deficiency anemia.  No active bleeding episodes. Will collect iron studies today and contact her with results.  -If iron studies are abnormal and she requires dose of IV iron, then would recommend she stop oral iron and we can continue to monitor her iron studies going forward and replete with IV iron as needed.  Dispo:  -Continue port flush every 2 months.  -Return to cancer center in 6 months for follow-up with port flush/labs.    All questions were answered to patient's stated satisfaction. Encouraged patient to call with any new concerns or questions before her next visit to the cancer center and we can certain see her sooner, if needed.    Plan of care discussed with Dr. Talbert Cage, who agrees with the above aforementioned.    Orders placed this encounter:  Orders Placed This Encounter  Procedures  . CA 125  . Comprehensive metabolic panel  . CBC with Differential/Platelet      Mike Craze, NP [place] [phone] '

## 2017-08-31 ENCOUNTER — Other Ambulatory Visit: Payer: Self-pay

## 2017-08-31 ENCOUNTER — Encounter (HOSPITAL_COMMUNITY): Payer: Medicare Other | Attending: Oncology | Admitting: Adult Health

## 2017-08-31 ENCOUNTER — Encounter (HOSPITAL_COMMUNITY): Payer: Self-pay | Admitting: Adult Health

## 2017-08-31 ENCOUNTER — Encounter (HOSPITAL_BASED_OUTPATIENT_CLINIC_OR_DEPARTMENT_OTHER): Payer: Medicare Other

## 2017-08-31 VITALS — BP 148/84 | HR 96 | Temp 97.8°F | Resp 18 | Wt 143.8 lb

## 2017-08-31 DIAGNOSIS — Z8543 Personal history of malignant neoplasm of ovary: Secondary | ICD-10-CM

## 2017-08-31 DIAGNOSIS — Z85048 Personal history of other malignant neoplasm of rectum, rectosigmoid junction, and anus: Secondary | ICD-10-CM

## 2017-08-31 DIAGNOSIS — C569 Malignant neoplasm of unspecified ovary: Secondary | ICD-10-CM | POA: Diagnosis present

## 2017-08-31 DIAGNOSIS — C210 Malignant neoplasm of anus, unspecified: Secondary | ICD-10-CM | POA: Insufficient documentation

## 2017-08-31 DIAGNOSIS — D509 Iron deficiency anemia, unspecified: Secondary | ICD-10-CM

## 2017-08-31 DIAGNOSIS — D649 Anemia, unspecified: Secondary | ICD-10-CM

## 2017-08-31 LAB — IRON AND TIBC
IRON: 75 ug/dL (ref 28–170)
Saturation Ratios: 19 % (ref 10.4–31.8)
TIBC: 388 ug/dL (ref 250–450)
UIBC: 313 ug/dL

## 2017-08-31 LAB — COMPREHENSIVE METABOLIC PANEL
ALT: 18 U/L (ref 14–54)
AST: 25 U/L (ref 15–41)
Albumin: 4 g/dL (ref 3.5–5.0)
Alkaline Phosphatase: 54 U/L (ref 38–126)
Anion gap: 7 (ref 5–15)
BILIRUBIN TOTAL: 0.3 mg/dL (ref 0.3–1.2)
BUN: 13 mg/dL (ref 6–20)
CHLORIDE: 105 mmol/L (ref 101–111)
CO2: 27 mmol/L (ref 22–32)
CREATININE: 0.63 mg/dL (ref 0.44–1.00)
Calcium: 9.4 mg/dL (ref 8.9–10.3)
Glucose, Bld: 92 mg/dL (ref 65–99)
POTASSIUM: 3.9 mmol/L (ref 3.5–5.1)
Sodium: 139 mmol/L (ref 135–145)
TOTAL PROTEIN: 6.8 g/dL (ref 6.5–8.1)

## 2017-08-31 LAB — CBC WITH DIFFERENTIAL/PLATELET
BASOS ABS: 0 10*3/uL (ref 0.0–0.1)
Basophils Relative: 1 %
EOS ABS: 0.2 10*3/uL (ref 0.0–0.7)
Eosinophils Relative: 4 %
HCT: 36.8 % (ref 36.0–46.0)
HEMOGLOBIN: 11.9 g/dL — AB (ref 12.0–15.0)
LYMPHS ABS: 1.5 10*3/uL (ref 0.7–4.0)
LYMPHS PCT: 34 %
MCH: 30.1 pg (ref 26.0–34.0)
MCHC: 32.3 g/dL (ref 30.0–36.0)
MCV: 92.9 fL (ref 78.0–100.0)
Monocytes Absolute: 0.3 10*3/uL (ref 0.1–1.0)
Monocytes Relative: 6 %
NEUTROS PCT: 55 %
Neutro Abs: 2.4 10*3/uL (ref 1.7–7.7)
PLATELETS: 200 10*3/uL (ref 150–400)
RBC: 3.96 MIL/uL (ref 3.87–5.11)
RDW: 16.5 % — ABNORMAL HIGH (ref 11.5–15.5)
WBC: 4.4 10*3/uL (ref 4.0–10.5)

## 2017-08-31 LAB — FERRITIN: FERRITIN: 29 ng/mL (ref 11–307)

## 2017-08-31 MED ORDER — HEPARIN SOD (PORK) LOCK FLUSH 100 UNIT/ML IV SOLN
500.0000 [IU] | Freq: Once | INTRAVENOUS | Status: AC
  Administered 2017-08-31: 500 [IU] via INTRAVENOUS

## 2017-08-31 MED ORDER — SODIUM CHLORIDE 0.9% FLUSH
10.0000 mL | INTRAVENOUS | Status: DC | PRN
Start: 2017-08-31 — End: 2017-08-31
  Administered 2017-08-31: 10 mL via INTRAVENOUS
  Filled 2017-08-31: qty 10

## 2017-08-31 MED ORDER — HEPARIN SOD (PORK) LOCK FLUSH 100 UNIT/ML IV SOLN
INTRAVENOUS | Status: AC
  Filled 2017-08-31: qty 5

## 2017-08-31 NOTE — Progress Notes (Signed)
Port flushed per protocol.  Labs drawn from port.  Site clean and dry with no bruising or swelling noted at site.  No complaints of pain at site.  Band aid applied.  VSS with discharge and left ambulatory.

## 2017-08-31 NOTE — Patient Instructions (Addendum)
[place] at [facility] Discharge Instructions  RECOMMENDATIONS MADE BY THE CONSULTANT AND ANY TEST RESULTS WILL BE SENT TO YOUR REFERRING PHYSICIAN.  You were seen today by Mike Craze, NP Continue to get port flushed every 6-8 weeks Follow up in 6 months with port flush and labs See schedulers up front for appointments   Thank you for choosing [place] at [facility] to provide your oncology and hematology care.  To afford each patient quality time with our provider, please arrive at least 15 minutes before your scheduled appointment time.    If you have a lab appointment with the [facility] please come in thru the  Main Entrance and check in at the main information desk  You need to re-schedule your appointment should you arrive 10 or more minutes late.  We strive to give you quality time with our providers, and arriving late affects you and other patients whose appointments are after yours.  Also, if you no show three or more times for appointments you may be dismissed from the clinic at the providers discretion.     Again, thank you for choosing [facility].  Our hope is that these requests will decrease the amount of time that you wait before being seen by our physicians.       _____________________________________________________________  Should you have questions after your visit to [facility], please contact our office at [phone] between the hours of 8:30 a.m. and 4:30 p.m.  Voicemails left after 4:30 p.m. will not be returned until the following business day.  For prescription refill requests, have your pharmacy contact our office.       Resources For Cancer Patients and their Caregivers ? American Cancer Society: Can assist with transportation, wigs, general needs, runs Look Good Feel Better.        782-647-7663 ? Cancer Care: Provides financial assistance, online support groups,  medication/co-pay assistance.  1-800-813-HOPE (615)177-4926) ? [place] Assists [place] cancer patients and their families through emotional , educational and financial support.  913-230-5570 ? [place] DSS Where to apply for food stamps, Medicaid and utility assistance. (615)763-8882 ? [facility]: Transportation to medical appointments. 984-616-9666 ? Social Security Administration: May apply for disability if have a Stage IV cancer. 680-764-5420 (707)267-3408 ? [employer], Disability and Transit Services: Assists with nutrition, care and transit needs. [place] Support Programs: @10RELATIVEDAYS @ > Cancer Support Group  2nd Tuesday of the month 1pm-2pm, Journey Room  > Creative Journey  3rd Tuesday of the month 1130am-1pm, Journey Room  > Look Good Feel Better  1st Wednesday of the month 10am-12 noon, Journey Room (Call [facility] to register [phone])

## 2017-09-01 LAB — CA 125: CANCER ANTIGEN (CA) 125: 11.6 U/mL (ref 0.0–38.1)

## 2017-09-04 ENCOUNTER — Other Ambulatory Visit (HOSPITAL_COMMUNITY): Payer: Self-pay | Admitting: Emergency Medicine

## 2017-09-04 MED ORDER — IRON POLYSACCH CMPLX-B12-FA 150-0.025-1 MG PO CAPS: 1.0000 | ORAL_CAPSULE | Freq: Every day | ORAL | 2 refills | Status: DC

## 2017-09-04 NOTE — Progress Notes (Signed)
Pt needed a refill on iron.  Sent to [facility] in [place].

## 2017-11-02 ENCOUNTER — Other Ambulatory Visit: Payer: Self-pay

## 2017-11-02 ENCOUNTER — Encounter (HOSPITAL_COMMUNITY): Payer: Self-pay

## 2017-11-02 ENCOUNTER — Inpatient Hospital Stay (HOSPITAL_COMMUNITY): Payer: Medicare Other | Attending: Internal Medicine

## 2017-11-02 DIAGNOSIS — Z452 Encounter for adjustment and management of vascular access device: Secondary | ICD-10-CM | POA: Insufficient documentation

## 2017-11-02 DIAGNOSIS — Z85048 Personal history of other malignant neoplasm of rectum, rectosigmoid junction, and anus: Secondary | ICD-10-CM | POA: Diagnosis not present

## 2017-11-02 MED ORDER — HEPARIN SOD (PORK) LOCK FLUSH 100 UNIT/ML IV SOLN
500.0000 [IU] | Freq: Once | INTRAVENOUS | Status: AC
  Administered 2017-11-02: 500 [IU] via INTRAVENOUS

## 2017-11-02 MED ORDER — SODIUM CHLORIDE 0.9% FLUSH
10.0000 mL | INTRAVENOUS | Status: DC | PRN
  Administered 2017-11-02: 10 mL via INTRAVENOUS
  Filled 2017-11-02: qty 10

## 2017-11-02 NOTE — Progress Notes (Signed)
Abigail Wagner presented for Portacath access and flush.  Proper placement of portacath confirmed by CXR.  Portacath located left chest wall accessed with  H 20 needle.  Good blood return present. Portacath flushed with 79ml NS and 500U/50ml Heparin and needle removed intact.  Procedure tolerated well and without incident.  Discharged ambulatory.

## 2017-11-06 ENCOUNTER — Ambulatory Visit (HOSPITAL_COMMUNITY): Payer: Self-pay | Admitting: Psychiatry

## 2017-11-13 ENCOUNTER — Encounter (HOSPITAL_COMMUNITY): Payer: Self-pay | Admitting: Psychiatry

## 2017-11-13 ENCOUNTER — Ambulatory Visit (HOSPITAL_COMMUNITY): Payer: Medicare Other | Admitting: Psychiatry

## 2017-11-13 DIAGNOSIS — Z818 Family history of other mental and behavioral disorders: Secondary | ICD-10-CM | POA: Diagnosis not present

## 2017-11-13 DIAGNOSIS — Z810 Family history of intellectual disabilities: Secondary | ICD-10-CM | POA: Diagnosis not present

## 2017-11-13 DIAGNOSIS — F331 Major depressive disorder, recurrent, moderate: Secondary | ICD-10-CM

## 2017-11-13 DIAGNOSIS — Z87891 Personal history of nicotine dependence: Secondary | ICD-10-CM

## 2017-11-13 DIAGNOSIS — M2550 Pain in unspecified joint: Secondary | ICD-10-CM

## 2017-11-13 DIAGNOSIS — Z811 Family history of alcohol abuse and dependence: Secondary | ICD-10-CM

## 2017-11-13 DIAGNOSIS — F419 Anxiety disorder, unspecified: Secondary | ICD-10-CM

## 2017-11-13 MED ORDER — LORAZEPAM 1 MG PO TABS: 1.0000 mg | ORAL_TABLET | Freq: Three times a day (TID) | ORAL | 3 refills | Status: DC | PRN

## 2017-11-13 MED ORDER — DIVALPROEX SODIUM ER 500 MG PO TB24: 500.0000 mg | ORAL_TABLET | Freq: Every day | ORAL | 3 refills | Status: DC

## 2017-11-13 MED ORDER — ESCITALOPRAM OXALATE 20 MG PO TABS: 20.0000 mg | ORAL_TABLET | Freq: Two times a day (BID) | ORAL | 3 refills | Status: DC

## 2017-11-13 NOTE — Progress Notes (Signed)
[place] MD/PA/NP OP Progress Note  11/13/2017 11:46 AM Abigail Wagner  MRN:  [id number]  Chief Complaint:  Chief Complaint    Depression; Anxiety; Follow-up     WUJ:WJXB patient is a 67 year old married white female lives with her husband in [place]. She has 3 children and 5 grandchildren. She is retired from [employer].  The patient states she's had depression since her 26s. She was hospitalized several times, last time being in 2010 when she took a drug overdose. She was going through a lot of family stress back then. She states that she was hospitalized [facility] and since then she has never wanted to go back. She's been quite stable despite her low doses of medication. She's also stopped drinking alcohol which is made a big difference.  The patient returns after 4 months.  She is doing quite well.  She is totally cancer free now.  She enjoys watching her grandson who is almost 21 years old.  Her mood is good and her anxiety is under good control and she is eating and sleeping well.  She states that her energy is finally come back.  She is not using drugs or alcohol denies suicidal ID Visit Diagnosis:    ICD-10-CM   1. Major depressive disorder, recurrent episode, moderate (HCC) F33.1 escitalopram (LEXAPRO) 20 MG tablet    Past Psychiatric History: Past hospitalizations for depression and alcohol abuse, none since 2010  Past Medical History:  Past Medical History:  Diagnosis Date  . Allergy   . Anal carcinoma [facility]) oncologist-  dr Whitney Muse ([facility])   dx 12/ 2016 [facility] --  completed therapy  03/ 2017  . Anemia   . Anxiety   . Blood transfusion without reported diagnosis   . Carcinoma of ovary, stage 3 [facility]) oncologist-  dr gehrig/  dr Doreene Eland (cancer center in [place])--  no recurrency   dx 09/ 2015  Stage IIIB  papillary ovarian carcinoma  s/p  omentectomy and BSO &  chemotherapy (completed 12-07-2014)  . Chemotherapy induced nausea and vomiting  10/23/2014  . Chronic kidney disease    stones  . DDD (degenerative disc disease), lumbosacral   . GERD (gastroesophageal reflux disease)   . Headache(784.0)   . History of acute respiratory failure    05-30-2009  drug overdose-- (intubated for 2 days)  and aspiration pneumonia  . History of adenomatous polyp of colon 10/07/2015   tubular adenoma high grade dysplasia  . History of herpes genitalis   . History of kidney stones   . History of ovarian cancer 08/03/2015  . History of suicide attempt    per documentation in epic  05-30-2009  overdose benaodiazepine  . HTN (hypertension)    takes Metoprolol daily  . Hyperlipidemia   . Hypertension 09/27/2016  . IBS (irritable bowel syndrome)    takes Bentyl daily  . Major depression   . Mood disorder ([place])   . OA (osteoarthritis)   . Prolapse of vaginal vault after hysterectomy 07/20/2015  . Rectovaginal fistula 08/05/2015  . Sigmoid diverticulosis   . Smokers' cough [facility])     Past Surgical History:  Procedure Laterality Date  . BREAST ENHANCEMENT SURGERY  1986  . [place]  . COLONOSCOPY N/A 10/07/2015   Procedure: COLONOSCOPY;  Surgeon: Rogene Houston, MD;  [facility];  Service: Endoscopy;  Laterality: N/A;  7:30  . EXPLORATORY LAPAROTOMY/ OMENTECTOMY/  BILATERAL SALPINGOOPHORECTOMY/  PORT-A-CATH PLACEMENT  07-03-2014  [place]  . KNEE ARTHROSCOPY Left 09-22-2004  . LAPAROSCOPY N/A 06/02/2014   Procedure: DIAGNOSTIC LAPAROSCOPY, OMENTAL BIOPSY, RIGHT OVARY BIOPSY, LYSIS OF ADHESIONS;  Surgeon: Fanny Skates, MD;  Location: [place];  Service: General;  Laterality: N/A;  . MUCOSAL ADVANCEMENT FLAP N/A 06/15/2016   Procedure: EXCISION RECTOVAGINAOL FISTULA WITH MUCOSAL ADVANCEMENT FLAP;  Surgeon: Leighton Ruff, MD;  Location: [place];  Service: General;  Laterality: N/A;  . PLACEMENT OF SETON N/A 09/09/2015   Procedure: PLACEMENT OF SETON;  Surgeon: Leighton Ruff, MD;  Location: [place];  Service: General;  Laterality: N/A;  . PORT-A-CATH REMOVAL Right 08/17/2014   Procedure: REMOVAL INTRAPERITONEAL CHEMO PORT;  Surgeon: Fanny Skates, MD;  Location: [place];  Service: General;  Laterality: Right;  . PORTACATH PLACEMENT Right 08/17/2014   Procedure:  PLACE NEW PORT A CATH;  Surgeon: Fanny Skates, MD;  Location: [place];  Service: General;  Laterality: Right;  . RECTAL BIOPSY N/A 09/09/2015   Procedure: BIOPSY OF RECTOVAGINAL MASS;  Surgeon: Leighton Ruff, MD;  Location: [place];  Service: General;  Laterality: N/A;  . TUBAL LIGATION  YRS AGO  . VAGINAL HYSTERECTOMY  1981   fibroids    Family Psychiatric History: See below  Family History:  Family History  Problem Relation Age of Onset  . Bipolar disorder Mother   . Anxiety disorder Mother   . Dementia Mother   . Depression Mother   . Mental illness Mother   . Vision loss Mother   . Alzheimer's disease Mother   . Alcohol abuse Paternal Uncle   . Bipolar disorder Maternal Grandmother   . Dementia Maternal Grandmother   . Alzheimer's disease Maternal Grandmother   . Alcohol abuse Paternal Uncle   . Stroke Brother   . Deep vein thrombosis Son   . Heart disease Father   . Hyperlipidemia Father   . Hypertension Father   . Stroke Father   . Vision loss Father   . Atrial fibrillation Sister   . Heart disease Brother   . Heart disease Brother   . ADD / ADHD Neg Hx   . Drug abuse Neg Hx   . OCD Neg Hx   . Paranoid behavior Neg Hx   . Schizophrenia Neg Hx   . Seizures Neg Hx   . Sexual abuse Neg Hx   . Physical abuse Neg Hx     Social History:  Social History   Socioeconomic History  . Marital status: Married    Spouse name: Coralyn Mark  . Number of children: 3  . Years of education: 45  . Highest education level: None  Social Needs  . Financial resource strain: None  . Food insecurity - worry: None  . Food insecurity - inability:  None  . Transportation needs - medical: None  . Transportation needs - non-medical: None  Occupational History  . Occupation: retire    Comment: [employer]  Tobacco Use  . Smoking status: Former Smoker    Packs/day: 1.00    Years: 43.00    Pack years: 43.00    Types: Cigarettes    Last attempt to quit: 05/24/2016    Years since quitting: 1.4  . Smokeless tobacco: Never Used  Substance and Sexual Activity  . Alcohol use: No  . Drug use: No  . Sexual activity: Not Currently    Birth control/protection: Surgical  Other Topics Concern  . None  Social History Narrative  Lives at home with terry   Retired [employer]    Allergies: No Known Allergies  Metabolic Disorder Labs: No results found for: HGBA1C, MPG No results found for: PROLACTIN Lab Results  Component Value Date   CHOL 238 (H) 01/10/2017   TRIG 139 01/10/2017   HDL 46 01/10/2017   CHOLHDL 5.2 01/10/2017   VLDL 28 01/10/2017   LDLCALC 164 (H) 01/10/2017     Therapeutic Level Labs: No results found for: LITHIUM No results found for: VALPROATE No components found for:  CBMZ  Current Medications: Current Outpatient Medications  Medication Sig Dispense Refill  . Aspirin-Salicylamide-Caffeine (BC HEADACHE POWDER PO) Take 1 packet by mouth as needed.     Marland Kitchen b complex vitamins tablet Take 1 tablet by mouth daily.    Marland Kitchen dicyclomine (BENTYL) 10 MG capsule 10 to 20 mg by mouth 4 times daily as needed 180 capsule 1  . divalproex (DEPAKOTE ER) 500 MG 24 hr tablet Take 1 tablet (500 mg total) by mouth daily. 90 tablet 3  . escitalopram (LEXAPRO) 20 MG tablet Take 1 tablet (20 mg total) by mouth 2 (two) times daily. 90 tablet 3  . IRON PO Take by mouth.    . Iron Polysacch Cmplx-B12-FA (POLY-IRON 150 FORTE) 150-0.025-1 MG CAPS Take 1 capsule by mouth daily. 30 each 2  . KRILL OIL PO Take 350 mg by mouth daily.    . lansoprazole (PREVACID) 15 MG capsule Take 15 mg by mouth as needed.     Marland Kitchen LORazepam (ATIVAN) 1 MG tablet  Take 1 tablet (1 mg total) by mouth 3 (three) times daily as needed for anxiety. 90 tablet 3  . meloxicam (MOBIC) 15 MG tablet Take 1 tablet (15 mg total) by mouth daily. 90 tablet 3  . Multiple Vitamin (MULTIVITAMIN) tablet Take 1 tablet by mouth daily.    . valACYclovir (VALTREX) 1000 MG tablet Take 1 tablet (1,000 mg total) by mouth daily as needed. 5 tablet 11   No current facility-administered medications for this visit.      Musculoskeletal: Strength & Muscle Tone: within normal limits Gait & Station: normal Patient leans: N/A  Psychiatric Specialty Exam: Review of Systems  Musculoskeletal: Positive for joint pain.  All other systems reviewed and are negative.   Blood pressure (!) 163/88, pulse 71, resp. rate (!) 99, height 5\' 5"  (1.651 m), weight 147 lb (66.7 kg).Body mass index is 24.46 kg/m.  General Appearance: Casual, Neat and Well Groomed  Eye Contact:  Good  Speech:  Clear and Coherent  Volume:  Normal  Mood:  Euthymic  Affect:  Congruent  Thought Process:  Goal Directed  Orientation:  Full (Time, Place, and Person)  Thought Content: WDL   Suicidal Thoughts:  No  Homicidal Thoughts:  No  Memory:  Immediate;   Good Recent;   Good Remote;   Good  Judgement:  Good  Insight:  Good  Psychomotor Activity:  Normal  Concentration:  Concentration: Good and Attention Span: Good  Recall:  Good  Fund of Knowledge: Good  Language: Good  Akathisia:  No  Handed:  Right  AIMS (if indicated): not done  Assets:  Communication Skills Desire for Improvement Resilience Social Support Talents/Skills  ADL's:  Intact  Cognition: WNL  Sleep:  Good   Screenings: PHQ2-9     Clinical Support from 01/08/2017 in [facility] Visit from 11/07/2016 in [facility]  PHQ-2 Total Score  0  0  Assessment and Plan: She is a 67 year old female with a history of depression and anxiety.  She is doing well now particularly that her cancer has gone  into remission.  She will continue lorazepam 1 mg 3 times daily as needed for anxiety, Lexapro 20 mg twice a day for depression and Depakote ER 500 mg daily for mood swings.  She will return to see me in 4 months   [name], MD 11/13/2017, 11:46 AM

## 2017-12-31 ENCOUNTER — Other Ambulatory Visit: Payer: Self-pay

## 2017-12-31 ENCOUNTER — Inpatient Hospital Stay (HOSPITAL_COMMUNITY): Payer: Medicare Other | Attending: Internal Medicine

## 2017-12-31 ENCOUNTER — Encounter (HOSPITAL_COMMUNITY): Payer: Self-pay

## 2017-12-31 DIAGNOSIS — Z8543 Personal history of malignant neoplasm of ovary: Secondary | ICD-10-CM | POA: Diagnosis not present

## 2017-12-31 DIAGNOSIS — Z452 Encounter for adjustment and management of vascular access device: Secondary | ICD-10-CM | POA: Diagnosis not present

## 2017-12-31 DIAGNOSIS — Z85048 Personal history of other malignant neoplasm of rectum, rectosigmoid junction, and anus: Secondary | ICD-10-CM | POA: Insufficient documentation

## 2017-12-31 MED ORDER — SODIUM CHLORIDE 0.9% FLUSH
10.0000 mL | INTRAVENOUS | Status: DC | PRN
Start: 2017-12-31 — End: 2017-12-31
  Administered 2017-12-31: 10 mL via INTRAVENOUS
  Filled 2017-12-31: qty 10

## 2017-12-31 MED ORDER — HEPARIN SOD (PORK) LOCK FLUSH 100 UNIT/ML IV SOLN
INTRAVENOUS | Status: AC
  Filled 2017-12-31: qty 5

## 2017-12-31 MED ORDER — HEPARIN SOD (PORK) LOCK FLUSH 100 UNIT/ML IV SOLN
500.0000 [IU] | Freq: Once | INTRAVENOUS | Status: AC
  Administered 2017-12-31: 500 [IU] via INTRAVENOUS

## 2017-12-31 NOTE — Progress Notes (Signed)
Abigail Wagner presented for Portacath access and flush. Portacath located right chest wall accessed with  H 20 needle.  Good blood return present. Portacath flushed with 10 ml NS and 500 units/86ml Heparin and needle removed intact.  Procedure tolerated well and without incident.  Discharged ambulatory.

## 2018-01-13 ENCOUNTER — Other Ambulatory Visit (HOSPITAL_COMMUNITY): Payer: Self-pay | Admitting: Psychiatry

## 2018-01-13 DIAGNOSIS — F331 Major depressive disorder, recurrent, moderate: Secondary | ICD-10-CM

## 2018-01-15 ENCOUNTER — Other Ambulatory Visit: Payer: Self-pay | Admitting: Family Medicine

## 2018-01-15 ENCOUNTER — Other Ambulatory Visit: Payer: Self-pay

## 2018-01-15 ENCOUNTER — Ambulatory Visit: Payer: Medicare Other | Admitting: Family Medicine

## 2018-01-15 ENCOUNTER — Encounter: Payer: Self-pay | Admitting: Family Medicine

## 2018-01-15 VITALS — BP 134/78 | HR 96 | Temp 98.3°F | Resp 12 | Ht 65.0 in | Wt 145.1 lb

## 2018-01-15 DIAGNOSIS — G8929 Other chronic pain: Secondary | ICD-10-CM | POA: Diagnosis not present

## 2018-01-15 DIAGNOSIS — E785 Hyperlipidemia, unspecified: Secondary | ICD-10-CM

## 2018-01-15 DIAGNOSIS — M545 Low back pain: Secondary | ICD-10-CM

## 2018-01-15 DIAGNOSIS — K219 Gastro-esophageal reflux disease without esophagitis: Secondary | ICD-10-CM | POA: Diagnosis not present

## 2018-01-15 DIAGNOSIS — Z716 Tobacco abuse counseling: Secondary | ICD-10-CM | POA: Diagnosis not present

## 2018-01-15 MED ORDER — CYCLOBENZAPRINE HCL 5 MG PO TABS: 5.0000 mg | ORAL_TABLET | Freq: Three times a day (TID) | ORAL | 3 refills | Status: DC | PRN

## 2018-01-15 NOTE — Progress Notes (Signed)
Chief Complaint  Patient presents with  . Medication Refill   Patient is here for routine follow-up. She has hyperlipidemia but refuses to take a statin.  She is due for hyperlipidemia testing, however this is my last day working in the clinic and I requested she come back and have this done with her new doctor. She has a history of GERD.  She takes Prevacid.  This works well for her. She has had 2 cancers in 2015 and 2017, first she had an anal cancer and as this was finishing treatment she had ovarian cancer.  She continues to be screened regularly.  She is done with all of her treatment.  She feels like she is doing well. In spite of all that she has endured, she has started smoking again.  I told her, quite frankly, that this is insane.  She is engaging in risk-taking behavior when she is still recovering from to quite serious cancers.  She gives a story of her mother-in-law dying and it was stressful, so she picked them up again.  We discussed a multitude of reasons why she needs to quit again.  We discussed various methods of quitting.  We set a quit date.  I offered her assistance.  She thinks that she can do it cold [place].  She does not want prescription medication.  She states part of the problem is that her husband smokes.  I told her that she should ask him to quit with her. Under the care of Dr. Harrington Challenger in psychiatry for anxiety and depression.  She states she feels these are stable. She has intermittent back pain.  She tries to do home exercises.  She takes over-the-counter medicines are Mobic as needed.  She would like a prescription for Flexeril to take as needed.  I discussed with her that it can have side effects such as sedation and dry mouth.  She should resist the temptation to take it daily.  I recommended a Pneumovax vaccine for her today. Patient Active Problem List   Diagnosis Date Noted  . Genetic testing 05/24/2017  . HLD (hyperlipidemia) 02/07/2017  . Normocytic  normochromic anemia 01/10/2017  . GERD (gastroesophageal reflux disease) 09/27/2016  . Back pain 09/27/2016  . Anxiety 09/27/2016  . Allergy 09/27/2016  . Radiation proctitis 12/23/2015  . Anal cancer ([place]) 11/19/2015  . History of ovarian cancer 08/03/2015  . Prolapse of vaginal vault after hysterectomy 07/20/2015  . Migraines 05/20/2015  . Port-A-Cath in place 01/13/2015  . Epithelial ovarian cancer, FIGO stage IIIA ([place]) 07/16/2014  . Abdominal carcinomatosis ([place]) 06/18/2014  . Bipolar 1 disorder ([place]) 12/27/2011  . Kidney stones 12/27/2011  . IBS (irritable colon syndrome) 12/27/2011  . Depression 11/07/2011    Outpatient Encounter Medications as of 01/15/2018  Medication Sig  . Aspirin-Salicylamide-Caffeine (BC HEADACHE POWDER PO) Take 1 packet by mouth as needed.   Marland Kitchen b complex vitamins tablet Take 1 tablet by mouth daily.  . divalproex (DEPAKOTE ER) 500 MG 24 hr tablet Take 1 tablet (500 mg total) by mouth daily.  Marland Kitchen escitalopram (LEXAPRO) 20 MG tablet Take 1 tablet (20 mg total) by mouth 2 (two) times daily.  . IRON PO Take by mouth.  . Iron Polysacch Cmplx-B12-FA (POLY-IRON 150 FORTE) 150-0.025-1 MG CAPS Take 1 capsule by mouth daily.  Marland Kitchen KRILL OIL PO Take 350 mg by mouth daily.  . lansoprazole (PREVACID) 15 MG capsule Take 15 mg by mouth as needed.   Marland Kitchen LORazepam (ATIVAN) 1  MG tablet Take 1 tablet (1 mg total) by mouth 3 (three) times daily as needed for anxiety.  . Multiple Vitamin (MULTIVITAMIN) tablet Take 1 tablet by mouth daily.  . valACYclovir (VALTREX) 1000 MG tablet Take 1 tablet (1,000 mg total) by mouth daily as needed.  . cyclobenzaprine (FLEXERIL) 5 MG tablet Take 1 tablet (5 mg total) by mouth 3 (three) times daily as needed for muscle spasms.   No facility-administered encounter medications on file as of 01/15/2018.     No Known Allergies  Review of Systems  Constitutional: Negative for activity change, appetite change and unexpected weight change.  HENT:  Negative for congestion, dental problem, postnasal drip and rhinorrhea.   Eyes: Negative for redness and visual disturbance.  Respiratory: Negative for cough and shortness of breath.   Cardiovascular: Negative for chest pain, palpitations and leg swelling.  Gastrointestinal: Positive for diarrhea. Negative for abdominal pain and constipation.       Chronic  Genitourinary: Negative for difficulty urinating, frequency and menstrual problem.  Musculoskeletal: Positive for arthralgias and back pain.       Chronic intermittent  Neurological: Negative for dizziness and headaches.  Psychiatric/Behavioral: Negative for dysphoric mood and sleep disturbance. The patient is not nervous/anxious.        Sees psychiatry    Physical Exam  Constitutional: She is oriented to person, place, and time. She appears well-developed and well-nourished.  HENT:  Head: Normocephalic and atraumatic.  Right Ear: External ear normal.  Left Ear: External ear normal.  Mouth/Throat: Oropharynx is clear and moist.  Eyes: Pupils are equal, round, and reactive to light. Conjunctivae are normal.  Neck: Normal range of motion. Neck supple. No thyromegaly present.  Cardiovascular: Normal rate, regular rhythm and normal heart sounds.  Pulmonary/Chest: Effort normal. No respiratory distress. She has wheezes.  Few scattered posterior wheeze  Abdominal: Soft. Bowel sounds are normal. There is no tenderness.  Musculoskeletal: Normal range of motion. She exhibits no edema.  Lymphadenopathy:    She has no cervical adenopathy.  Neurological: She is alert and oriented to person, place, and time.  Gait normal  Skin: Skin is warm and dry.  Reflexes dull  Psychiatric: She has a normal mood and affect. Her behavior is normal. Thought content normal.  Mildly anxious demeanor  Nursing note and vitals reviewed.   BP 134/78   Pulse 96   Temp 98.3 F (36.8 C) (Oral)   Resp 12   Ht 5\' 5"  (1.651 m)   Wt 145 lb 1.9 oz (65.8 kg)    SpO2 97%   BMI 24.15 kg/m      ASSESSMENT/PLAN:  1. Chronic bilateral low back pain without sciatica Over-the-counter medicines, Mobic, home care.  PRN Flexeril  2. Hyperlipidemia, unspecified hyperlipidemia type Diet and exercise are recommended.  Her Framingham risk is 17%.  3. Gastroesophageal reflux disease without esophagitis Continue current medications and treatment.  4. Tobacco abuse counseling Spent much of the visit talking about her tobacco abuse.  Methods of quitting.  Reasons for quitting.  Documentation as above.We set a quit date of May 1.  If this is unsuccessful try again [employer].  She should keep setting goals of quitting until she is successful.   Patient Instructions  No change in medicine I have prescribed flexeril with a refill You need to follow up in 3 months for a physical Need labs next time Call a week for visit to get lab order You will see Dr Mannie Stabile next visit I  am glad you plan to quit smoking   Raylene Everts, MD

## 2018-01-15 NOTE — Patient Instructions (Signed)
No change in medicine I have prescribed flexeril with a refill You need to follow up in 3 months for a physical Need labs next time Call a week for visit to get lab order You will see Dr Mannie Stabile next visit I am glad you plan to quit smoking

## 2018-01-28 DIAGNOSIS — Z85048 Personal history of other malignant neoplasm of rectum, rectosigmoid junction, and anus: Secondary | ICD-10-CM | POA: Diagnosis not present

## 2018-02-12 ENCOUNTER — Other Ambulatory Visit: Payer: Self-pay | Admitting: Family Medicine

## 2018-02-12 DIAGNOSIS — K130 Diseases of lips: Secondary | ICD-10-CM | POA: Diagnosis not present

## 2018-02-28 ENCOUNTER — Ambulatory Visit (HOSPITAL_COMMUNITY): Payer: Self-pay | Admitting: Internal Medicine

## 2018-02-28 ENCOUNTER — Encounter: Payer: Self-pay | Admitting: Family Medicine

## 2018-02-28 ENCOUNTER — Inpatient Hospital Stay (HOSPITAL_COMMUNITY): Payer: Medicare Other | Attending: Internal Medicine

## 2018-02-28 ENCOUNTER — Ambulatory Visit (HOSPITAL_COMMUNITY): Payer: Self-pay | Admitting: Adult Health

## 2018-02-28 ENCOUNTER — Other Ambulatory Visit (HOSPITAL_COMMUNITY): Payer: Self-pay

## 2018-02-28 VITALS — BP 133/68 | HR 90 | Temp 98.4°F | Resp 16

## 2018-02-28 DIAGNOSIS — F17210 Nicotine dependence, cigarettes, uncomplicated: Secondary | ICD-10-CM | POA: Insufficient documentation

## 2018-02-28 DIAGNOSIS — I10 Essential (primary) hypertension: Secondary | ICD-10-CM | POA: Diagnosis not present

## 2018-02-28 DIAGNOSIS — Z8543 Personal history of malignant neoplasm of ovary: Secondary | ICD-10-CM | POA: Diagnosis present

## 2018-02-28 DIAGNOSIS — D509 Iron deficiency anemia, unspecified: Secondary | ICD-10-CM | POA: Insufficient documentation

## 2018-02-28 DIAGNOSIS — Z85048 Personal history of other malignant neoplasm of rectum, rectosigmoid junction, and anus: Secondary | ICD-10-CM | POA: Diagnosis not present

## 2018-02-28 DIAGNOSIS — Z95828 Presence of other vascular implants and grafts: Secondary | ICD-10-CM

## 2018-02-28 DIAGNOSIS — D649 Anemia, unspecified: Secondary | ICD-10-CM

## 2018-02-28 DIAGNOSIS — C210 Malignant neoplasm of anus, unspecified: Secondary | ICD-10-CM

## 2018-02-28 DIAGNOSIS — C569 Malignant neoplasm of unspecified ovary: Secondary | ICD-10-CM

## 2018-02-28 DIAGNOSIS — Z9221 Personal history of antineoplastic chemotherapy: Secondary | ICD-10-CM | POA: Insufficient documentation

## 2018-02-28 DIAGNOSIS — Z923 Personal history of irradiation: Secondary | ICD-10-CM | POA: Insufficient documentation

## 2018-02-28 LAB — COMPREHENSIVE METABOLIC PANEL
ALK PHOS: 48 U/L (ref 38–126)
ALT: 19 U/L (ref 14–54)
ANION GAP: 9 (ref 5–15)
AST: 26 U/L (ref 15–41)
Albumin: 3.9 g/dL (ref 3.5–5.0)
BILIRUBIN TOTAL: 0.6 mg/dL (ref 0.3–1.2)
BUN: 12 mg/dL (ref 6–20)
CO2: 30 mmol/L (ref 22–32)
CREATININE: 0.54 mg/dL (ref 0.44–1.00)
Calcium: 9.3 mg/dL (ref 8.9–10.3)
Chloride: 98 mmol/L — ABNORMAL LOW (ref 101–111)
GFR calc non Af Amer: >60 mL/min (ref >60)
Glucose, Bld: 88 mg/dL (ref 65–99)
Potassium: 4.1 mmol/L (ref 3.5–5.1)
Sodium: 137 mmol/L (ref 135–145)
TOTAL PROTEIN: 6.7 g/dL (ref 6.5–8.1)

## 2018-02-28 LAB — CBC WITH DIFFERENTIAL/PLATELET
Basophils Absolute: 0 10*3/uL (ref 0.0–0.1)
Basophils Relative: 0 %
Eosinophils Absolute: 0.1 10*3/uL (ref 0.0–0.7)
Eosinophils Relative: 3 %
HEMATOCRIT: 37.7 % (ref 36.0–46.0)
HEMOGLOBIN: 12.7 g/dL (ref 12.0–15.0)
LYMPHS ABS: 1 10*3/uL (ref 0.7–4.0)
LYMPHS PCT: 26 %
MCH: 31.1 pg (ref 26.0–34.0)
MCHC: 33.7 g/dL (ref 30.0–36.0)
MCV: 92.2 fL (ref 78.0–100.0)
MONOS PCT: 8 %
Monocytes Absolute: 0.3 10*3/uL (ref 0.1–1.0)
NEUTROS ABS: 2.4 10*3/uL (ref 1.7–7.7)
NEUTROS PCT: 63 %
Platelets: 227 10*3/uL (ref 150–400)
RBC: 4.09 MIL/uL (ref 3.87–5.11)
RDW: 15 % (ref 11.5–15.5)
WBC: 3.8 10*3/uL — ABNORMAL LOW (ref 4.0–10.5)

## 2018-02-28 MED ORDER — HEPARIN SOD (PORK) LOCK FLUSH 100 UNIT/ML IV SOLN
500.0000 [IU] | Freq: Once | INTRAVENOUS | Status: AC
  Administered 2018-02-28: 500 [IU] via INTRAVENOUS

## 2018-02-28 MED ORDER — HEPARIN SOD (PORK) LOCK FLUSH 100 UNIT/ML IV SOLN
INTRAVENOUS | Status: AC
  Filled 2018-02-28: qty 5

## 2018-02-28 MED ORDER — SODIUM CHLORIDE 0.9% FLUSH
10.0000 mL | Freq: Once | INTRAVENOUS | Status: AC
  Administered 2018-02-28: 10 mL via INTRAVENOUS

## 2018-03-01 ENCOUNTER — Encounter: Payer: Self-pay | Admitting: Family Medicine

## 2018-03-01 ENCOUNTER — Other Ambulatory Visit: Payer: Self-pay

## 2018-03-01 ENCOUNTER — Inpatient Hospital Stay (HOSPITAL_BASED_OUTPATIENT_CLINIC_OR_DEPARTMENT_OTHER): Payer: Medicare Other | Admitting: Internal Medicine

## 2018-03-01 ENCOUNTER — Encounter (HOSPITAL_COMMUNITY): Payer: Self-pay | Admitting: Internal Medicine

## 2018-03-01 ENCOUNTER — Encounter (HOSPITAL_COMMUNITY): Payer: Self-pay

## 2018-03-01 VITALS — BP 116/64 | HR 93 | Temp 97.8°F | Resp 16 | Wt 150.5 lb

## 2018-03-01 DIAGNOSIS — C569 Malignant neoplasm of unspecified ovary: Secondary | ICD-10-CM

## 2018-03-01 DIAGNOSIS — I10 Essential (primary) hypertension: Secondary | ICD-10-CM

## 2018-03-01 DIAGNOSIS — Z923 Personal history of irradiation: Secondary | ICD-10-CM | POA: Diagnosis not present

## 2018-03-01 DIAGNOSIS — Z9221 Personal history of antineoplastic chemotherapy: Secondary | ICD-10-CM

## 2018-03-01 DIAGNOSIS — Z85048 Personal history of other malignant neoplasm of rectum, rectosigmoid junction, and anus: Secondary | ICD-10-CM | POA: Diagnosis not present

## 2018-03-01 DIAGNOSIS — F17210 Nicotine dependence, cigarettes, uncomplicated: Secondary | ICD-10-CM | POA: Diagnosis not present

## 2018-03-01 DIAGNOSIS — D649 Anemia, unspecified: Secondary | ICD-10-CM

## 2018-03-01 DIAGNOSIS — Z8543 Personal history of malignant neoplasm of ovary: Secondary | ICD-10-CM | POA: Diagnosis not present

## 2018-03-01 DIAGNOSIS — D509 Iron deficiency anemia, unspecified: Secondary | ICD-10-CM | POA: Diagnosis not present

## 2018-03-01 DIAGNOSIS — C210 Malignant neoplasm of anus, unspecified: Secondary | ICD-10-CM

## 2018-03-01 LAB — CA 125: CANCER ANTIGEN (CA) 125: 10.1 U/mL (ref 0.0–38.1)

## 2018-03-01 NOTE — Progress Notes (Signed)
Abigail Wagner presented for Portacath access and flush. Portacath located right  chest wall accessed with  H 20 needle. No blood return and no resistance met Portacath flushed with 91m NS and 500U/539mHeparin and needle removed intact. Procedure without incident. Patient tolerated procedure well.  Labs drawn peripherally  Vitals stable and discharged home from clinic ambulatory. Follow up as scheduled.

## 2018-03-01 NOTE — Patient Instructions (Signed)
[facility] at [facility] Discharge Instructions  Port flush done and labs drawn Follow up as scheduled.   Thank you for choosing [facility] at [facility] to provide your oncology and hematology care.  To afford each patient quality time with our provider, please arrive at least 15 minutes before your scheduled appointment time.   If you have a lab appointment with the [facility] please come in thru the  Main Entrance and check in at the main information desk  You need to re-schedule your appointment should you arrive 10 or more minutes late.  We strive to give you quality time with our providers, and arriving late affects you and other patients whose appointments are after yours.  Also, if you no show three or more times for appointments you may be dismissed from the clinic at the providers discretion.     Again, thank you for choosing [facility].  Our hope is that these requests will decrease the amount of time that you wait before being seen by our physicians.       _____________________________________________________________  Should you have questions after your visit to [facility], please contact our office at [phone] between the hours of 8:30 a.m. and 4:30 p.m.  Voicemails left after 4:30 p.m. will not be returned until the following business day.  For prescription refill requests, have your pharmacy contact our office.       Resources For Cancer Patients and their Caregivers ? American Cancer Society: Can assist with transportation, wigs, general needs, runs Look Good Feel Better.        737-776-2939 ? Cancer Care: Provides financial assistance, online support groups, medication/co-pay assistance.  1-800-813-HOPE 631-213-8653) ? [place] Assists [place] cancer patients and their families through emotional , educational and financial support.  (870) 351-7220 ? [place] DSS Where to apply for food stamps, Medicaid and utility assistance. 407-802-1791 ? [facility]: Transportation to medical appointments. 947-317-4330 ? Social Security Administration: May apply for disability if have a Stage IV cancer. (586) 257-0110 515-719-4207 ? [employer], Disability and Transit Services: Assists with nutrition, care and transit needs. [place] Support Programs:   > Cancer Support Group  2nd Tuesday of the month 1pm-2pm, Journey Room   > Creative Journey  3rd Tuesday of the month 1130am-1pm, Journey Room

## 2018-03-01 NOTE — Progress Notes (Signed)
Diagnosis Anal cancer ([place]) - Plan: CBC with Differential/Platelet, Comprehensive metabolic panel, Lactate dehydrogenase, Ferritin, CEA  Normocytic normochromic anemia - Plan: CBC with Differential/Platelet, Comprehensive metabolic panel, Lactate dehydrogenase, Ferritin, CEA  Staging Cancer Staging Anal cancer (HCC) Staging form: Anus, AJCC 8th Edition - Clinical: Stage IIA (cT2, cN0, cM0) - Signed by Baird Cancer, PA-C on 09/27/2016  Diagnosis:  Stage II anal cancer AND Stage III ovarian cancer AND iron deficiency anemia   CURRENT THERAPY: Surveillance per NCCN Guidelines for both malignancies AND Ferrex forte po daily    Assessment and Plan:1. Stage II anal cancer: 67 yr old female diagnosed in 08/2015. Treated with concurrent chemoradiation with 5-FU/Mitomycin C with curative intent. Completed treatment on 12/06/15. Last CT chest/abd/pelvis in 08/2016 negative for recurrent disease.     She denies any GI symptoms and has noted no blood in stool. She is here for follow-up.  Labs done 02/28/2018 reviewed with pt and shows WBC 3.8, HB 12.7 plts 227,000 CA  125 10 which is WNL.  Chemistries WNL with Cr 0.54 and normal LFTs.   Pt will RTC in 08/2018 for follow-up and repeat labs.  She is advised to notify the office if any change in symptoms prior to that time.    She should continue to follow-up with Dr. Marcello Moores with GI surgery for DRE and anoscopy as directed.  Also continue GI follow-up as recommended.    2.  Stage III ovarian cancer.  She was diagnosed in 2015 and treated with debulking surgery with Dr. Alycia Rossetti, followed by [facility] chemotherapy x 6 cycles. Completed treatment on 12/07/14. CA-125 19.2 at time of diagnosis. Genetic testing in 04/2017 revealed no mutations.   CA 125 done 02/28/2018 was normal at 10.  She will have repeat labs in 08/2018.  Follow-up with GYN/Oncology as recommended.    3.  PAC.  She desires to keep her Port.  She should continue flushes as directed.    4.   IDA.  Pt was on oral iron but reports she has not been taking it for some time.  Labs done 02/28/2018 reviewed and show HB 12.7 which is improved from labs done 08/31/2018.  She will have repeat labs with ferritin on RTC 08/2018.    5.  HTN.  BP is 116/64.  Pt should continue to follow-up with PCP.    6.  Health maintenance.  Continue GI follow-up and mammograms as recommended.    Current Status:  Pt is seen today for follow-up.  She is here to go over labs.       History of ovarian cancer   06/02/2014 Procedure    Diagnostic laparoscopy, lysis of adhesions, biopsy of omental nodules, biopsy right ovary by Dr Dalbert Batman      06/04/2014 Pathology Results    Diagnosis 1. Omentum, biopsy, Anterior peritoneal & omental nodule - SEROUS CARCINOMA WITH ASSOCIATED ABUNDANT PSAMMOMA BODIES AND DESMOPLASTIC STROMAL REACTION CONSISTENT WITH INVASIVE IMPLANTS. - SEE COMMENT. 2. Omentum, biopsy, Omental nodule - SEROUS CARCINOMA WITH ASSOCIATED ABUNDANT PSAMMOMA BODIES AND DESMOPLASTIC STROMAL REACTION CONSISTENT WITH INVASIVE IMPLANTS. - SEE COMMENT. 3. Ovary, biopsy/wedge resection, Right ovary - SMALL FRAGMENTS OF ATYPICAL PAPILLARY PROLIFERATION WITH PSAMMOMA BODIES CONSISTENT WITH AT LEAST SEROUS BORDERLINE TUMOR.      07/03/2014 Procedure    Exploratory laparotomy, omentectomy, Bilateral salpingo-oophorectomy, inptraperitoneal PORT placement by Dr. Alycia Rossetti          07/24/2014 - 12/07/2014 Chemotherapy    Carboplatin/Paclitaxel x 6 cycles  08/17/2014 Procedure    Insertion of 8 French power port clear view tunneled venous vascular access device next line use of fluoroscopy for guidance and positioning Use of ultrasound for venipuncture Removal of intraperitoneal chemotherapy port By Dr. Dalbert Batman      05/14/2017 Genetic Testing    Negative genetic testing on the [facility] panel.  The [facility] gene panel offered by [employer] includes sequencing and deletion/duplication  testing of the following 28 genes: APC, ATM, BARD1, BMPR1A, BRCA1, BRCA2, BRIP1, CHD1, CDK4, CDKN2A, CHEK2, EPCAM (large rearrangement only), MLH1, MSH2, MSH6, MUTYH, NBN, PALB2, PMS2, PTEN, RAD51C, RAD51D, SMAD4, STK11, and TP53. Sequencing was performed for select regions of POLE and POLD1, and large rearrangement analysis was performed for select regions of GREM1. The report date is April 27, 2017.  HRD testing was performed on the ovarian tumor.  The tumor was negative for any deleterious BRCA1 or BRCA2 mutations.    Genomic instability status was not interpretable.  Therefore, [facility] was unable to analyze the ovarian tumor for genomic instability.  The report date is May 14, 2017.        Anal cancer ([place])   09/09/2015 Procedure    Rectovaginal septal mass biopsy by Dr. Marcello Moores      09/10/2015 Pathology Results    Soft tissue mass, biopsy, rectovaginal septal mass - SQUAMOUS CELL CARCINOMA.      10/15/2015 PET scan    1. Focal hypermetabolism with associated ill-defined soft tissue fullness at the junction of the anterior anal wall and posterior inferior vaginal wall, in keeping with the provided history of a primary anal carcinoma. 2. No hypermetabolic locoregional or distant metastatic disease. 3. Status post hysterectomy, with no abnormal findings at the vaginal cuff. No evidence of hypermetabolic peritoneal tumor. 4. Tiny nonobstructing bilateral renal stones.      10/25/2015 - 12/06/2015 Radiation Therapy    [place]      10/25/2015 - 12/06/2015 Chemotherapy    Mitomycin C and 5FU, by Dr. Jacquiline Doe       06/15/2016 Procedure    EXCISION RECTOVAGINAOL FISTULA WITH MUCOSAL ADVANCEMENT FLAP by Dr. Marcello Moores      06/18/2016 Pathology Results    Fistula, rectovaginal SQUAMOUS, COLUMNAR JUNCTION MUCOSA AND SUBCUTANOUS SOFT TISSUE WITH FISTULA NO EVIDENCE OF MALIGNANCY      09/20/2016 Imaging    CT CAP- 1. No acute findings and no evidence for recurrent tumor  or metastatic disease. 2. Aortic atherosclerosis and coronary artery calcification        Problem List Patient Active Problem List   Diagnosis Date Noted  . Genetic testing [Z13.79] 05/24/2017  . HLD (hyperlipidemia) [E78.5] 02/07/2017  . Normocytic normochromic anemia [D64.9] 01/10/2017  . GERD (gastroesophageal reflux disease) [K21.9] 09/27/2016  . Back pain [M54.9] 09/27/2016  . Anxiety [F41.9] 09/27/2016  . Allergy [T78.40XA] 09/27/2016  . Radiation proctitis [K62.7] 12/23/2015  . Anal cancer ([place]) [C21.0] 11/19/2015  . History of ovarian cancer [Z85.43] 08/03/2015  . Prolapse of vaginal vault after hysterectomy [N99.3] 07/20/2015  . Migraines [G43.909] 05/20/2015  . Port-A-Cath in place [facility] 01/13/2015  . Epithelial ovarian cancer, FIGO stage IIIA ([place]) [C56.9] 07/16/2014  . Abdominal carcinomatosis ([place]) [C76.2] 06/18/2014  . Bipolar 1 disorder ([place]) [F31.9] 12/27/2011  . Kidney stones [N20.0] 12/27/2011  . IBS (irritable colon syndrome) [K58.9] 12/27/2011  . Depression [F32.9] 11/07/2011    Past Medical History Past Medical History:  Diagnosis Date  . Allergy   . Anal carcinoma [facility]) oncologist-  dr penland ([facility])   dx 12/ 2016 [facility] --  completed therapy  03/ 2017  . Anemia   . Anxiety   . Blood transfusion without reported diagnosis   . Carcinoma of ovary, stage 3 [facility]) oncologist-  dr gehrig/  dr Doreene Eland (cancer center in [place])--  no recurrency   dx 09/ 2015  Stage IIIB  papillary ovarian carcinoma  s/p  omentectomy and BSO &  chemotherapy (completed 12-07-2014)  . Chemotherapy induced nausea and vomiting 10/23/2014  . Chronic kidney disease    stones  . DDD (degenerative disc disease), lumbosacral   . GERD (gastroesophageal reflux disease)   . Headache(784.0)   . History of acute respiratory failure    05-30-2009  drug overdose-- (intubated for 2 days)  and aspiration pneumonia  . History of adenomatous polyp of colon 10/07/2015    tubular adenoma high grade dysplasia  . History of herpes genitalis   . History of kidney stones   . History of ovarian cancer 08/03/2015  . History of suicide attempt    per documentation in epic  05-30-2009  overdose benaodiazepine  . HTN (hypertension)    takes Metoprolol daily  . Hyperlipidemia   . Hypertension 09/27/2016  . IBS (irritable bowel syndrome)    takes Bentyl daily  . Major depression   . Mood disorder ([place])   . OA (osteoarthritis)   . Prolapse of vaginal vault after hysterectomy 07/20/2015  . Rectovaginal fistula 08/05/2015  . Sigmoid diverticulosis   . Smokers' cough [facility])     Past Surgical History Past Surgical History:  Procedure Laterality Date  . BREAST ENHANCEMENT SURGERY  1986  . [place]  . COLONOSCOPY N/A 10/07/2015   Procedure: COLONOSCOPY;  Surgeon: Rogene Houston, MD;  [facility];  Service: Endoscopy;  Laterality: N/A;  7:30  . EXPLORATORY LAPAROTOMY/ OMENTECTOMY/  BILATERAL SALPINGOOPHORECTOMY/  [facility] PLACEMENT  07-03-2014   [facility]  . KNEE ARTHROSCOPY Left 09-22-2004  . LAPAROSCOPY N/A 06/02/2014   Procedure: DIAGNOSTIC LAPAROSCOPY, OMENTAL BIOPSY, RIGHT OVARY BIOPSY, LYSIS OF ADHESIONS;  Surgeon: Fanny Skates, MD;  Location: [place];  Service: General;  Laterality: N/A;  . MUCOSAL ADVANCEMENT FLAP N/A 06/15/2016   Procedure: EXCISION RECTOVAGINAOL FISTULA WITH MUCOSAL ADVANCEMENT FLAP;  Surgeon: Leighton Ruff, MD;  Location: [place];  Service: General;  Laterality: N/A;  . PLACEMENT OF SETON N/A 09/09/2015   Procedure: PLACEMENT OF SETON;  Surgeon: Leighton Ruff, MD;  Location: [place];  Service: General;  Laterality: N/A;  . PORT-A-CATH REMOVAL Right 08/17/2014   Procedure: REMOVAL INTRAPERITONEAL CHEMO PORT;  Surgeon: Fanny Skates, MD;  Location: [place];  Service: General;  Laterality: Right;  . PORTACATH PLACEMENT Right 08/17/2014   Procedure:  PLACE  NEW PORT A CATH;  Surgeon: Fanny Skates, MD;  Location: [place];  Service: General;  Laterality: Right;  . RECTAL BIOPSY N/A 09/09/2015   Procedure: BIOPSY OF RECTOVAGINAL MASS;  Surgeon: Leighton Ruff, MD;  Location: [place];  Service: General;  Laterality: N/A;  . TUBAL LIGATION  YRS AGO  . VAGINAL HYSTERECTOMY  1981   fibroids    Family History Family History  Problem Relation Age of Onset  . Bipolar disorder Mother   . Anxiety disorder Mother   . Dementia Mother   . Depression Mother   . Mental illness Mother   . Vision loss Mother   . Alzheimer's disease Mother   .  Alcohol abuse Paternal Uncle   . Bipolar disorder Maternal Grandmother   . Dementia Maternal Grandmother   . Alzheimer's disease Maternal Grandmother   . Alcohol abuse Paternal Uncle   . Stroke Brother   . Deep vein thrombosis Son   . Heart disease Father   . Hyperlipidemia Father   . Hypertension Father   . Stroke Father   . Vision loss Father   . Atrial fibrillation Sister   . Heart disease Brother   . Heart disease Brother   . ADD / ADHD Neg Hx   . Drug abuse Neg Hx   . OCD Neg Hx   . Paranoid behavior Neg Hx   . Schizophrenia Neg Hx   . Seizures Neg Hx   . Sexual abuse Neg Hx   . Physical abuse Neg Hx      Social History  reports that she has been smoking cigarettes.  She has a 21.50 pack-year smoking history. She has never used smokeless tobacco. She reports that she does not drink alcohol or use drugs.  Medications  Current Outpatient Medications:  .  Aspirin-Salicylamide-Caffeine (BC HEADACHE POWDER PO), Take 1 packet by mouth as needed. , Disp: , Rfl:  .  b complex vitamins tablet, Take 1 tablet by mouth daily., Disp: , Rfl:  .  cyclobenzaprine (FLEXERIL) 5 MG tablet, Take 1 tablet (5 mg total) by mouth 3 (three) times daily as needed for muscle spasms., Disp: 30 tablet, Rfl: 3 .  divalproex (DEPAKOTE ER) 500 MG 24 hr tablet, Take 1 tablet (500 mg  total) by mouth daily., Disp: 90 tablet, Rfl: 3 .  escitalopram (LEXAPRO) 20 MG tablet, Take 1 tablet (20 mg total) by mouth 2 (two) times daily., Disp: 90 tablet, Rfl: 3 .  IRON PO, Take by mouth., Disp: , Rfl:  .  Iron Polysacch Cmplx-B12-FA (POLY-IRON 150 FORTE) 150-0.025-1 MG CAPS, Take 1 capsule by mouth daily., Disp: 30 each, Rfl: 2 .  KRILL OIL PO, Take 350 mg by mouth daily., Disp: , Rfl:  .  lansoprazole (PREVACID) 15 MG capsule, Take 15 mg by mouth as needed. , Disp: , Rfl:  .  LORazepam (ATIVAN) 1 MG tablet, Take 1 tablet (1 mg total) by mouth 3 (three) times daily as needed for anxiety., Disp: 90 tablet, Rfl: 3 .  meloxicam (MOBIC) 15 MG tablet, TAKE 1 TABLET BY MOUTH EVERY DAY, Disp: 30 tablet, Rfl: 0 .  Multiple Vitamin (MULTIVITAMIN) tablet, Take 1 tablet by mouth daily., Disp: , Rfl:  .  valACYclovir (VALTREX) 1000 MG tablet, Take 1 tablet (1,000 mg total) by mouth daily as needed., Disp: 5 tablet, Rfl: 11  Allergies Patient has no known allergies.  Review of Systems Review of Systems - Oncology ROS as per HPI otherwise 12 point ROS is negative.   Physical Exam  Vitals Wt Readings from Last 3 Encounters:  03/01/18 150 lb 8 oz (68.3 kg)  01/15/18 145 lb 1.9 oz (65.8 kg)  08/31/17 143 lb 12.8 oz (65.2 kg)   Temp Readings from Last 3 Encounters:  03/01/18 97.8 F (36.6 C) (Oral)  02/28/18 98.4 F (36.9 C)  01/15/18 98.3 F (36.8 C) (Oral)   BP Readings from Last 3 Encounters:  03/01/18 116/64  02/28/18 133/68  01/15/18 134/78   Pulse Readings from Last 3 Encounters:  03/01/18 93  02/28/18 90  01/15/18 96   Constitutional: Well-developed, well-nourished, and in no distress.   HENT: Head: Normocephalic and atraumatic.  Mouth/Throat: No oropharyngeal exudate.  Mucosa moist. Eyes: Pupils are equal, round, and reactive to light. Conjunctivae are normal. No scleral icterus.  Neck: Normal range of motion. Neck supple. No JVD present.  Cardiovascular: Normal  rate, regular rhythm and normal heart sounds.  Exam reveals no gallop and no friction rub.   No murmur heard. Pulmonary/Chest: Effort normal and breath sounds normal. No respiratory distress. No wheezes.No rales.  Abdominal: Soft. Bowel sounds are normal. No distension. There is no tenderness. There is no guarding.  Musculoskeletal: No edema or tenderness.  Lymphadenopathy: No cervical, axillary or supraclavicular adenopathy.  Neurological: Alert and oriented to person, place, and time. No cranial nerve deficit.  Skin: Skin is warm and dry. No rash noted. No erythema. No pallor.  Psychiatric: Affect and judgment normal.   Labs Infusion on 02/28/2018  Component Date Value Ref Range Status  . WBC 02/28/2018 3.8* 4.0 - 10.5 K/uL Final  . RBC 02/28/2018 4.09  3.87 - 5.11 MIL/uL Final  . Hemoglobin 02/28/2018 12.7  12.0 - 15.0 g/dL Final  . HCT 02/28/2018 37.7  36.0 - 46.0 % Final  . MCV 02/28/2018 92.2  78.0 - 100.0 fL Final  . MCH 02/28/2018 31.1  26.0 - 34.0 pg Final  . MCHC 02/28/2018 33.7  30.0 - 36.0 g/dL Final  . RDW 02/28/2018 15.0  11.5 - 15.5 % Final  . Platelets 02/28/2018 227  150 - 400 K/uL Final  . Neutrophils Relative % 02/28/2018 63  % Final  . Neutro Abs 02/28/2018 2.4  1.7 - 7.7 K/uL Final  . Lymphocytes Relative 02/28/2018 26  % Final  . Lymphs Abs 02/28/2018 1.0  0.7 - 4.0 K/uL Final  . Monocytes Relative 02/28/2018 8  % Final  . Monocytes Absolute 02/28/2018 0.3  0.1 - 1.0 K/uL Final  . Eosinophils Relative 02/28/2018 3  % Final  . Eosinophils Absolute 02/28/2018 0.1  0.0 - 0.7 K/uL Final  . Basophils Relative 02/28/2018 0  % Final  . Basophils Absolute 02/28/2018 0.0  0.0 - 0.1 K/uL Final   Performed at [facility], [address], [place], [place] [address]  . Sodium 02/28/2018 137  135 - 145 mmol/L Final  . Potassium 02/28/2018 4.1  3.5 - 5.1 mmol/L Final  . Chloride 02/28/2018 98* 101 - 111 mmol/L Final  . CO2 02/28/2018 30  22 - 32 mmol/L Final  . Glucose,  Bld 02/28/2018 88  65 - 99 mg/dL Final  . BUN 02/28/2018 12  6 - 20 mg/dL Final  . Creatinine, Ser 02/28/2018 0.54  0.44 - 1.00 mg/dL Final  . Calcium 02/28/2018 9.3  8.9 - 10.3 mg/dL Final  . Total Protein 02/28/2018 6.7  6.5 - 8.1 g/dL Final  . Albumin 02/28/2018 3.9  3.5 - 5.0 g/dL Final  . AST 02/28/2018 26  15 - 41 U/L Final  . ALT 02/28/2018 19  14 - 54 U/L Final  . Alkaline Phosphatase 02/28/2018 48  38 - 126 U/L Final  . Total Bilirubin 02/28/2018 0.6  0.3 - 1.2 mg/dL Final  . GFR calc non Af Amer 02/28/2018 >60  >60 mL/min Final  . GFR calc Af Amer 02/28/2018 >60  >60 mL/min Final   Comment: (NOTE) The eGFR has been calculated using the CKD EPI equation. This calculation has not been validated in all clinical situations. eGFR's persistently <60 mL/min signify possible Chronic Kidney Disease.   Georgiann Hahn gap 02/28/2018 9  5 - 15 Final   Performed at [facility], [address],  [place], [place] [address]  . Cancer Antigen (CA) 125 02/28/2018 10.1  0.0 - 38.1 U/mL Final   Comment: (NOTE) Roche [employer] Electrochemiluminescence Immunoassay (ECLIA) Values obtained with different assay methods or kits cannot be used interchangeably.  Results cannot be interpreted as absolute evidence of the presence or absence of malignant disease. Performed At: [facility] [place], [place] [id number] Rush Farmer MD [id number] Performed at [facility], [address] [place], [place] [address]      Pathology Orders Placed This Encounter  Procedures  . CBC with Differential/Platelet    Standing Status:   Future    Standing Expiration Date:   03/02/2019  . Comprehensive metabolic panel    Standing Status:   Future    Standing Expiration Date:   03/02/2019  . Lactate dehydrogenase    Standing Status:   Future    Standing Expiration Date:   03/02/2019  . Ferritin    Standing Status:   Future    Standing Expiration Date:   03/02/2019  . CEA    Standing Status:    Future    Standing Expiration Date:   03/02/2019       Zoila Shutter MD

## 2018-03-13 ENCOUNTER — Ambulatory Visit (HOSPITAL_COMMUNITY): Payer: Medicare Other | Admitting: Psychiatry

## 2018-03-13 ENCOUNTER — Encounter (HOSPITAL_COMMUNITY): Payer: Self-pay | Admitting: Psychiatry

## 2018-03-13 DIAGNOSIS — F331 Major depressive disorder, recurrent, moderate: Secondary | ICD-10-CM

## 2018-03-13 MED ORDER — ESCITALOPRAM OXALATE 20 MG PO TABS: 20.0000 mg | ORAL_TABLET | Freq: Two times a day (BID) | ORAL | 3 refills | Status: DC

## 2018-03-13 MED ORDER — DIVALPROEX SODIUM ER 500 MG PO TB24: 500.0000 mg | ORAL_TABLET | Freq: Every day | ORAL | 3 refills | Status: DC

## 2018-03-13 MED ORDER — LORAZEPAM 1 MG PO TABS: 1.0000 mg | ORAL_TABLET | Freq: Three times a day (TID) | ORAL | 3 refills | Status: DC | PRN

## 2018-03-13 NOTE — Progress Notes (Signed)
[place] MD/PA/NP OP Progress Note  03/13/2018 11:27 AM Abigail Wagner  MRN:  [id number]  Chief Complaint:  Chief Complaint    Depression; Anxiety; Follow-up     SJG:GEZM patient is a 67 year old married white female lives with her husband in [place]. She has 3 children and 5 grandchildren. She is retired from [employer].  The patient states she's had depression since her 17s. She was hospitalized several times, last time being in 2010 when she took a drug overdose. She was going through a lot of family stress back then. She states that she was hospitalized at [facility] center and since then she has never wanted to go back. She's been quite stable despite her low doses of medication. She's also stopped drinking alcohol which is made a big difference.  The patient returns after 4 months.  For a while she was going through a low.  She states that her 59 year old son became angry with her and blew up over the phone.  She states that he tends to have a bad temper but it really hurt her feelings.  She is trying to let this go.  She is very close to her daughter.  Overall her mood is good she is sleeping well and her energy is fairly good.  She does have some chronic hip pain on the positive side she is still cancer free Visit Diagnosis:    ICD-10-CM   1. Major depressive disorder, recurrent episode, moderate (HCC) F33.1 escitalopram (LEXAPRO) 20 MG tablet    Past Psychiatric History: Past hospitalizations for depression and alcohol abuse, none since 2010  Past Medical History:  Past Medical History:  Diagnosis Date  . Allergy   . Anal carcinoma [facility]) oncologist-  dr Whitney [facility] ([facility])   dx 12/ 2016 [facility] --  completed therapy  03/ 2017  . Anemia   . Anxiety   . Blood transfusion without reported diagnosis   . Carcinoma of ovary, stage 3 [facility]) oncologist-  dr gehrig/  dr Doreene Eland (cancer center in [place] w/ [facility])--  no recurrency   dx 09/ 2015  Stage IIIB  papillary ovarian  carcinoma  s/p  omentectomy and BSO &  chemotherapy (completed 12-07-2014)  . Chemotherapy induced nausea and vomiting 10/23/2014  . Chronic kidney disease    stones  . DDD (degenerative disc disease), lumbosacral   . GERD (gastroesophageal reflux disease)   . Headache(784.0)   . History of acute respiratory failure    05-30-2009  drug overdose-- (intubated for 2 days)  and aspiration pneumonia  . History of adenomatous polyp of colon 10/07/2015   tubular adenoma high grade dysplasia  . History of herpes genitalis   . History of kidney stones   . History of ovarian cancer 08/03/2015  . History of suicide attempt    per documentation in epic  05-30-2009  overdose benaodiazepine  . HTN (hypertension)    takes Metoprolol daily  . Hyperlipidemia   . Hypertension 09/27/2016  . IBS (irritable bowel syndrome)    takes Bentyl daily  . Major depression   . Mood disorder ([place])   . OA (osteoarthritis)   . Prolapse of vaginal vault after hysterectomy 07/20/2015  . Rectovaginal fistula 08/05/2015  . Sigmoid diverticulosis   . Smokers' cough [facility])     Past Surgical History:  Procedure Laterality Date  . BREAST ENHANCEMENT SURGERY  1986  . [place]  . COLONOSCOPY N/A 10/07/2015   Procedure: COLONOSCOPY;  Surgeon: Rogene Houston,  MD;  [facility];  Service: Endoscopy;  Laterality: N/A;  7:30  . EXPLORATORY LAPAROTOMY/ OMENTECTOMY/  BILATERAL SALPINGOOPHORECTOMY/  [facility] PLACEMENT  07-03-2014   [facility]  . KNEE ARTHROSCOPY Left 09-22-2004  . LAPAROSCOPY N/A 06/02/2014   Procedure: DIAGNOSTIC LAPAROSCOPY, OMENTAL BIOPSY, RIGHT OVARY BIOPSY, LYSIS OF ADHESIONS;  Surgeon: Fanny Skates, MD;  Location: [place];  Service: General;  Laterality: N/A;  . MUCOSAL ADVANCEMENT FLAP N/A 06/15/2016   Procedure: EXCISION RECTOVAGINAOL FISTULA WITH MUCOSAL ADVANCEMENT FLAP;  Surgeon: Leighton Ruff, MD;  Location: [place];  Service: General;  Laterality: N/A;  .  PLACEMENT OF SETON N/A 09/09/2015   Procedure: PLACEMENT OF SETON;  Surgeon: Leighton Ruff, MD;  Location: [place];  Service: General;  Laterality: N/A;  . PORT-A-CATH REMOVAL Right 08/17/2014   Procedure: REMOVAL INTRAPERITONEAL CHEMO PORT;  Surgeon: Fanny Skates, MD;  Location: [place];  Service: General;  Laterality: Right;  . PORTACATH PLACEMENT Right 08/17/2014   Procedure:  PLACE NEW PORT A CATH;  Surgeon: Fanny Skates, MD;  Location: [place];  Service: General;  Laterality: Right;  . RECTAL BIOPSY N/A 09/09/2015   Procedure: BIOPSY OF RECTOVAGINAL MASS;  Surgeon: Leighton Ruff, MD;  Location: [place];  Service: General;  Laterality: N/A;  . TUBAL LIGATION  YRS AGO  . VAGINAL HYSTERECTOMY  1981   fibroids    Family Psychiatric History: See below  Family History:  Family History  Problem Relation Age of Onset  . Bipolar disorder Mother   . Anxiety disorder Mother   . Dementia Mother   . Depression Mother   . Mental illness Mother   . Vision loss Mother   . Alzheimer's disease Mother   . Alcohol abuse Paternal Uncle   . Bipolar disorder Maternal Grandmother   . Dementia Maternal Grandmother   . Alzheimer's disease Maternal Grandmother   . Alcohol abuse Paternal Uncle   . Stroke Brother   . Deep vein thrombosis Son   . Heart disease Father   . Hyperlipidemia Father   . Hypertension Father   . Stroke Father   . Vision loss Father   . Atrial fibrillation Sister   . Heart disease Brother   . Heart disease Brother   . ADD / ADHD Neg Hx   . Drug abuse Neg Hx   . OCD Neg Hx   . Paranoid behavior Neg Hx   . Schizophrenia Neg Hx   . Seizures Neg Hx   . Sexual abuse Neg Hx   . Physical abuse Neg Hx     Social History:  Social History   Socioeconomic History  . Marital status: Married    Spouse name: Coralyn Mark  . Number of children: 3  . Years of education: 53  . Highest education level: Not  on file  Occupational History  . Occupation: retire    Comment: Animal nutritionist  . Financial resource strain: Not on file  . Food insecurity:    Worry: Not on file    Inability: Not on file  . Transportation needs:    Medical: Not on file    Non-medical: Not on file  Tobacco Use  . Smoking status: Current Every Day Smoker    Packs/day: 0.50    Years: 43.00    Pack years: 21.50    Types: Cigarettes  . Smokeless tobacco: Never Used  Substance and Sexual Activity  . Alcohol use: No  .  Drug use: No  . Sexual activity: Not Currently    Birth control/protection: Surgical  Lifestyle  . Physical activity:    Days per week: Not on file    Minutes per session: Not on file  . Stress: Not on file  Relationships  . Social connections:    Talks on phone: Not on file    Gets together: Not on file    Attends religious service: Not on file    Active member of club or organization: Not on file    Attends meetings of clubs or organizations: Not on file    Relationship status: Not on file  Other Topics Concern  . Not on file  Social History Narrative   Lives at home with Coralyn Mark   Retired [employer]    Allergies: No Known Allergies  Metabolic Disorder Labs: No results found for: HGBA1C, MPG No results found for: PROLACTIN Lab Results  Component Value Date   CHOL 238 (H) 01/10/2017   TRIG 139 01/10/2017   HDL 46 01/10/2017   CHOLHDL 5.2 01/10/2017   VLDL 28 01/10/2017   LDLCALC 164 (H) 01/10/2017     Therapeutic Level Labs: No results found for: LITHIUM No results found for: VALPROATE No components found for:  CBMZ  Current Medications: Current Outpatient Medications  Medication Sig Dispense Refill  . Aspirin-Salicylamide-Caffeine (BC HEADACHE POWDER PO) Take 1 packet by mouth as needed.     Marland Kitchen b complex vitamins tablet Take 1 tablet by mouth daily.    . cyclobenzaprine (FLEXERIL) 5 MG tablet Take 1 tablet (5 mg total) by mouth 3 (three) times daily as needed  for muscle spasms. 30 tablet 3  . divalproex (DEPAKOTE ER) 500 MG 24 hr tablet Take 1 tablet (500 mg total) by mouth daily. 90 tablet 3  . escitalopram (LEXAPRO) 20 MG tablet Take 1 tablet (20 mg total) by mouth 2 (two) times daily. 90 tablet 3  . IRON PO Take by mouth.    . Iron Polysacch Cmplx-B12-FA (POLY-IRON 150 FORTE) 150-0.025-1 MG CAPS Take 1 capsule by mouth daily. 30 each 2  . KRILL OIL PO Take 350 mg by mouth daily.    . lansoprazole (PREVACID) 15 MG capsule Take 15 mg by mouth as needed.     Marland Kitchen LORazepam (ATIVAN) 1 MG tablet Take 1 tablet (1 mg total) by mouth 3 (three) times daily as needed for anxiety. 90 tablet 3  . meloxicam (MOBIC) 15 MG tablet TAKE 1 TABLET BY MOUTH EVERY DAY 30 tablet 0  . Multiple Vitamin (MULTIVITAMIN) tablet Take 1 tablet by mouth daily.    . valACYclovir (VALTREX) 1000 MG tablet Take 1 tablet (1,000 mg total) by mouth daily as needed. 5 tablet 11   No current facility-administered medications for this visit.      Musculoskeletal: Strength & Muscle Tone: within normal limits Gait & Station: normal Patient leans: N/A  Psychiatric Specialty Exam: Review of Systems  Musculoskeletal: Positive for joint pain.  All other systems reviewed and are negative.   Blood pressure 139/74, pulse 91, height 5\' 5"  (1.651 m), weight 154 lb (69.9 kg), SpO2 100 %.Body mass index is 25.63 kg/m.  General Appearance: Casual, Neat and Well Groomed  Eye Contact:  Good  Speech:  Clear and Coherent  Volume:  Normal  Mood:  Euthymic  Affect:  Congruent  Thought Process:  Goal Directed  Orientation:  Full (Time, Place, and Person)  Thought Content: Rumination   Suicidal Thoughts:  No  Homicidal  Thoughts:  No  Memory:  Immediate;   Good Recent;   Good Remote;   Good  Judgement:  Good  Insight:  Good  Psychomotor Activity:  Normal  Concentration:  Concentration: Good and Attention Span: Good  Recall:  Good  Fund of Knowledge: Good  Language: Good  Akathisia:  No   Handed:  Right  AIMS (if indicated): not done  Assets:  Communication Skills Desire for Improvement Resilience Social Support Talents/Skills  ADL's:  Intact  Cognition: WNL  Sleep:  Good   Screenings: PHQ2-9     Office Visit from 01/15/2018 in [facility] from 01/08/2017 in [facility] Office Visit from 11/07/2016 in [facility]  PHQ-2 Total Score  1  0  0       Assessment and Plan: This patient is a 67 year old female with a history of depression and anxiety.  She is doing well on her current regimen.  She will continue Lexapro 20 mg twice a day for depression, Depakote 500 mg daily for mood stabilization and lorazepam 1 mg 3 times daily as needed for anxiety.  She will return to see me in  3 months   [name], MD 03/13/2018, 11:27 AM

## 2018-03-27 ENCOUNTER — Encounter: Payer: Self-pay | Admitting: Family Medicine

## 2018-03-28 ENCOUNTER — Other Ambulatory Visit: Payer: Self-pay | Admitting: Family Medicine

## 2018-04-01 ENCOUNTER — Encounter: Payer: Self-pay | Admitting: Family Medicine

## 2018-04-03 DIAGNOSIS — M79674 Pain in right toe(s): Secondary | ICD-10-CM | POA: Diagnosis not present

## 2018-04-03 DIAGNOSIS — L110 Acquired keratosis follicularis: Secondary | ICD-10-CM | POA: Diagnosis not present

## 2018-04-18 ENCOUNTER — Ambulatory Visit (INDEPENDENT_AMBULATORY_CARE_PROVIDER_SITE_OTHER): Payer: Self-pay

## 2018-04-18 ENCOUNTER — Encounter (INDEPENDENT_AMBULATORY_CARE_PROVIDER_SITE_OTHER): Payer: Self-pay | Admitting: Orthopaedic Surgery

## 2018-04-18 ENCOUNTER — Ambulatory Visit (INDEPENDENT_AMBULATORY_CARE_PROVIDER_SITE_OTHER): Payer: Medicare Other | Admitting: Orthopaedic Surgery

## 2018-04-18 VITALS — BP 160/89 | HR 88 | Ht 65.0 in | Wt 155.0 lb

## 2018-04-18 DIAGNOSIS — M545 Low back pain: Secondary | ICD-10-CM

## 2018-04-18 DIAGNOSIS — G8929 Other chronic pain: Secondary | ICD-10-CM

## 2018-04-18 MED ORDER — MELOXICAM 15 MG PO TABS: 15.0000 mg | ORAL_TABLET | Freq: Every day | ORAL | 2 refills | Status: DC

## 2018-04-18 NOTE — Progress Notes (Signed)
Office Visit Note   Patient: Abigail Wagner           Date of Birth: 05-24-1951           MRN: [id number] Visit Date: 04/18/2018              Requested by: Susy Frizzle, MD [address] [place],  [id number] PCP: Susy Frizzle, MD   Assessment & Plan: Visit Diagnoses:  1. Chronic left-sided low back pain, with sciatica presence unspecified     Plan: With patient's history of 2 primary cancers I recommend proceeding with MRI scan lumbar to rule out metastatic disease consistent with her claudication symptoms.  Office follow-up after scan.  Follow-Up Instructions: No follow-ups on file.   Orders:  Orders Placed This Encounter  Procedures  . XR Pelvis 1-2 Views  . XR Lumbar Spine 2-3 Views   No orders of the defined types were placed in this encounter.     Procedures: No procedures performed   Clinical Data: No additional findings.   Subjective: Chief Complaint  Patient presents with  . Lower Back - Pain  . Left Hip - Pain    HPI 67 year old female seen with pain at the lumbosacral junction left sacral area of the radiates into the left buttocks and lateral hip.  She has no known injury.  Previous pelvis x-rays February 2018.  She has had some chronic pain but states is been worse in the last 2 weeks.  No fever chills no bowel bladder symptoms.  She is used ibuprofen, CBD oil, muscle rubs, ice and heat without relief.  Patient had left greater than right leg weakness with standing for several minutes and increased pain and weakness with ambulation.  She is had ovarian cancer also anal cancer.  Previous treatment with chemotherapy also radiation.  Review of Systems 14 point review of systems positive for depression, bipolar 1 disorder, kidney stones, IBS, anal cancer stage II a, history of ovarian cancer stage IIIa, vaginal vault prolapse after hysterectomy.  History of problems with perforation extended hospital stay postop.  Radiation proctitis,  abdominal carcinomatosis.   Objective: Vital Signs: BP (!) 160/89   Pulse 88   Ht 5\' 5"  (1.651 m)   Wt 155 lb (70.3 kg)   BMI 25.79 kg/m   Physical Exam  Constitutional: She is oriented to person, place, and time. She appears well-developed.  HENT:  Head: Normocephalic.  Right Ear: External ear normal.  Left Ear: External ear normal.  Eyes: Pupils are equal, round, and reactive to light.  Neck: No tracheal deviation present. No thyromegaly present.  Cardiovascular: Normal rate.  Pulmonary/Chest: Effort normal.  Abdominal: Soft.  Neurological: She is alert and oriented to person, place, and time.  Skin: Skin is warm and dry.  Psychiatric: She has a normal mood and affect. Her behavior is normal.    Ortho Exam patient has pain with straight leg raising at 80 degrees on the left, 90 degrees on the right.  Generalized decreased strength lower extremities.  Knee and ankle jerk are intact.  Tenderness over the greater trochanter.  Tenderness at the lumbosacral junction.  Negative Faber test.  Negative logroll to the hips.  Anterior tib gastrocsoleus EHL is intact.  Specialty Comments:  No specialty comments available.  Imaging: No results found.   PMFS History: Patient Active Problem List   Diagnosis Date Noted  . Genetic testing 05/24/2017  . HLD (hyperlipidemia) 02/07/2017  . Normocytic normochromic anemia  01/10/2017  . GERD (gastroesophageal reflux disease) 09/27/2016  . Back pain 09/27/2016  . Anxiety 09/27/2016  . Allergy 09/27/2016  . Radiation proctitis 12/23/2015  . Anal cancer ([place]) 11/19/2015  . History of ovarian cancer 08/03/2015  . Prolapse of vaginal vault after hysterectomy 07/20/2015  . Migraines 05/20/2015  . Port-A-Cath in place 01/13/2015  . Epithelial ovarian cancer, FIGO stage IIIA ([place]) 07/16/2014  . Abdominal carcinomatosis ([place]) 06/18/2014  . Bipolar 1 disorder ([place]) 12/27/2011  . Kidney stones 12/27/2011  . IBS (irritable colon syndrome)  12/27/2011  . Depression 11/07/2011   Past Medical History:  Diagnosis Date  . Allergy   . Anal carcinoma [facility]) oncologist-  dr Whitney Muse ([facility])   dx 12/ 2016 [facility] --  completed therapy  03/ 2017  . Anemia   . Anxiety   . Blood transfusion without reported diagnosis   . Carcinoma of ovary, stage 3 [facility]) oncologist-  dr gehrig/  dr Doreene Eland (cancer center in [place] w/ [facility])--  no recurrency   dx 09/ 2015  Stage IIIB  papillary ovarian carcinoma  s/p  omentectomy and BSO &  chemotherapy (completed 12-07-2014)  . Chemotherapy induced nausea and vomiting 10/23/2014  . Chronic kidney disease    stones  . DDD (degenerative disc disease), lumbosacral   . GERD (gastroesophageal reflux disease)   . Headache(784.0)   . History of acute respiratory failure    05-30-2009  drug overdose-- (intubated for 2 days)  and aspiration pneumonia  . History of adenomatous polyp of colon 10/07/2015   tubular adenoma high grade dysplasia  . History of herpes genitalis   . History of kidney stones   . History of ovarian cancer 08/03/2015  . History of suicide attempt    per documentation in epic  05-30-2009  overdose benaodiazepine  . HTN (hypertension)    takes Metoprolol daily  . Hyperlipidemia   . Hypertension 09/27/2016  . IBS (irritable bowel syndrome)    takes Bentyl daily  . Major depression   . Mood disorder ([place])   . OA (osteoarthritis)   . Prolapse of vaginal vault after hysterectomy 07/20/2015  . Rectovaginal fistula 08/05/2015  . Sigmoid diverticulosis   . Smokers' cough ([place])     Family History  Problem Relation Age of Onset  . Bipolar disorder Mother   . Anxiety disorder Mother   . Dementia Mother   . Depression Mother   . Mental illness Mother   . Vision loss Mother   . Alzheimer's disease Mother   . Alcohol abuse Paternal Uncle   . Bipolar disorder Maternal Grandmother   . Dementia Maternal Grandmother   . Alzheimer's disease Maternal Grandmother   . Alcohol abuse  Paternal Uncle   . Stroke Brother   . Deep vein thrombosis Son   . Heart disease Father   . Hyperlipidemia Father   . Hypertension Father   . Stroke Father   . Vision loss Father   . Atrial fibrillation Sister   . Heart disease Brother   . Heart disease Brother   . ADD / ADHD Neg Hx   . Drug abuse Neg Hx   . OCD Neg Hx   . Paranoid behavior Neg Hx   . Schizophrenia Neg Hx   . Seizures Neg Hx   . Sexual abuse Neg Hx   . Physical abuse Neg Hx     Past Surgical History:  Procedure Laterality Date  . BREAST ENHANCEMENT SURGERY  1986  . CESAREAN SECTION  1978  . COLONOSCOPY N/A 10/07/2015   Procedure: COLONOSCOPY;  Surgeon: Rogene Houston, MD;  [facility];  Service: Endoscopy;  Laterality: N/A;  7:30  . EXPLORATORY LAPAROTOMY/ OMENTECTOMY/  BILATERAL SALPINGOOPHORECTOMY/  [facility] PLACEMENT  07-03-2014   [facility]  . KNEE ARTHROSCOPY Left 09-22-2004  . LAPAROSCOPY N/A 06/02/2014   Procedure: DIAGNOSTIC LAPAROSCOPY, OMENTAL BIOPSY, RIGHT OVARY BIOPSY, LYSIS OF ADHESIONS;  Surgeon: Fanny Skates, MD;  Location: [place];  Service: General;  Laterality: N/A;  . MUCOSAL ADVANCEMENT FLAP N/A 06/15/2016   Procedure: EXCISION RECTOVAGINAOL FISTULA WITH MUCOSAL ADVANCEMENT FLAP;  Surgeon: Leighton Ruff, MD;  Location: [place];  Service: General;  Laterality: N/A;  . PLACEMENT OF SETON N/A 09/09/2015   Procedure: PLACEMENT OF SETON;  Surgeon: Leighton Ruff, MD;  Location: [place];  Service: General;  Laterality: N/A;  . PORT-A-CATH REMOVAL Right 08/17/2014   Procedure: REMOVAL INTRAPERITONEAL CHEMO PORT;  Surgeon: Fanny Skates, MD;  Location: [place];  Service: General;  Laterality: Right;  . PORTACATH PLACEMENT Right 08/17/2014   Procedure:  PLACE NEW PORT A CATH;  Surgeon: Fanny Skates, MD;  Location: [place];  Service: General;  Laterality: Right;  . RECTAL BIOPSY N/A 09/09/2015   Procedure: BIOPSY  OF RECTOVAGINAL MASS;  Surgeon: Leighton Ruff, MD;  Location: [place];  Service: General;  Laterality: N/A;  . TUBAL LIGATION  YRS AGO  . VAGINAL HYSTERECTOMY  1981   fibroids   Social History   Occupational History  . Occupation: retire    Comment: [employer]  Tobacco Use  . Smoking status: Former Smoker    Packs/day: 0.50    Years: 43.00    Pack years: 21.50    Types: Cigarettes  . Smokeless tobacco: Never Used  Substance and Sexual Activity  . Alcohol use: No  . Drug use: No  . Sexual activity: Not Currently    Birth control/protection: Surgical

## 2018-04-19 ENCOUNTER — Encounter (INDEPENDENT_AMBULATORY_CARE_PROVIDER_SITE_OTHER): Payer: Self-pay | Admitting: Orthopaedic Surgery

## 2018-04-29 ENCOUNTER — Ambulatory Visit: Payer: Medicare Other | Admitting: Family Medicine

## 2018-05-01 ENCOUNTER — Telehealth (INDEPENDENT_AMBULATORY_CARE_PROVIDER_SITE_OTHER): Payer: Self-pay | Admitting: Orthopaedic Surgery

## 2018-05-01 NOTE — Telephone Encounter (Signed)
Will do!

## 2018-05-01 NOTE — Telephone Encounter (Signed)
Patient was contacted by GI and was asked if she would rather have MRI at [facility] or in [place]. She chose [facility] so can we get referral sent there? Patients # [phone]

## 2018-05-01 NOTE — Telephone Encounter (Signed)
Sent message to April P with scheduling, she will contact pt to schedule

## 2018-05-06 ENCOUNTER — Encounter: Payer: Self-pay | Admitting: Family Medicine

## 2018-05-08 ENCOUNTER — Ambulatory Visit (HOSPITAL_COMMUNITY)
Admission: RE | Admit: 2018-05-08 | Discharge: 2018-05-08 | Disposition: A | Discharge status: Home / Self Care | Payer: Medicare Other | Source: Ambulatory Visit | Attending: Orthopaedic Surgery | Admitting: Orthopaedic Surgery

## 2018-05-08 DIAGNOSIS — M47816 Spondylosis without myelopathy or radiculopathy, lumbar region: Secondary | ICD-10-CM | POA: Diagnosis not present

## 2018-05-08 DIAGNOSIS — G8929 Other chronic pain: Secondary | ICD-10-CM

## 2018-05-08 DIAGNOSIS — M545 Low back pain: Secondary | ICD-10-CM

## 2018-05-08 DIAGNOSIS — M5440 Lumbago with sciatica, unspecified side: Secondary | ICD-10-CM | POA: Diagnosis not present

## 2018-05-08 DIAGNOSIS — M5126 Other intervertebral disc displacement, lumbar region: Secondary | ICD-10-CM | POA: Diagnosis not present

## 2018-05-09 ENCOUNTER — Encounter: Payer: Self-pay | Admitting: Family Medicine

## 2018-05-14 ENCOUNTER — Ambulatory Visit (INDEPENDENT_AMBULATORY_CARE_PROVIDER_SITE_OTHER): Payer: Medicare Other | Admitting: Family Medicine

## 2018-05-14 ENCOUNTER — Encounter: Payer: Self-pay | Admitting: Family Medicine

## 2018-05-14 VITALS — BP 162/80 | HR 84 | Temp 97.8°F | Resp 14 | Ht 65.0 in | Wt 157.0 lb

## 2018-05-14 DIAGNOSIS — C569 Malignant neoplasm of unspecified ovary: Secondary | ICD-10-CM

## 2018-05-14 DIAGNOSIS — E785 Hyperlipidemia, unspecified: Secondary | ICD-10-CM

## 2018-05-14 DIAGNOSIS — Z8543 Personal history of malignant neoplasm of ovary: Secondary | ICD-10-CM

## 2018-05-14 DIAGNOSIS — Z23 Encounter for immunization: Secondary | ICD-10-CM | POA: Diagnosis not present

## 2018-05-14 DIAGNOSIS — R0989 Other specified symptoms and signs involving the circulatory and respiratory systems: Secondary | ICD-10-CM

## 2018-05-14 DIAGNOSIS — G8929 Other chronic pain: Secondary | ICD-10-CM

## 2018-05-14 DIAGNOSIS — I10 Essential (primary) hypertension: Secondary | ICD-10-CM | POA: Diagnosis not present

## 2018-05-14 DIAGNOSIS — M545 Low back pain: Secondary | ICD-10-CM

## 2018-05-14 DIAGNOSIS — Z716 Tobacco abuse counseling: Secondary | ICD-10-CM

## 2018-05-14 DIAGNOSIS — C210 Malignant neoplasm of anus, unspecified: Secondary | ICD-10-CM | POA: Diagnosis not present

## 2018-05-14 NOTE — Progress Notes (Signed)
Subjective:    Patient ID: Abigail Wagner, female    DOB: 1951/03/30, 67 y.o.   MRN: [id number]  HPI Patient is a very pleasant 67 year old Caucasian female who has a very complicated past medical history who is here today to establish care.  She has a history of stage III ovarian cancer with metastasis to the omentum.  This was surgically resected with a bilateral salpingo-oophorectomy.  Patient had previously had a partial hysterectomy.  She is currently followed with periodic imaging as well as a Ca125 at the [facility].  Her next appointment is in December for lab work to monitor her Ca125.  She also has a history of stage II anal cancer.  She sees a Dr. Marcello Moores who performs periodic endoscopies usually every 6 months according to the patient.  Her next appointment to see Dr. Marcello Moores is in October or November.  She also has a CEA checked at the [facility] to monitor for recurrence of this as well.  Her mammogram is scheduled for September.  She has not had a bone density test in quite some time.  She is due for Pneumovax 23 as she is a smoker.  She has a history of Prevnar 13 in 2018.  She is due for a flu shot.  She has a history of whitecoat hypertension as evidenced by her high blood pressure here however she states that her blood pressure at home is usually well controlled at 120/60.  She also has a history of hyperlipidemia.  Her total cholesterol was greater than 230 in 2018.  Her LDL cholesterol is greater than 160 in 2018.  She is also a smoker.  She battles generalized anxiety disorder and sees a Dr. Harrington Challenger who is a Teacher, music in [place].  She sees Dr. Harrington Challenger several times a year and then medicine as prescribed there.  Her physical exam today is concerning for a left-sided carotid bruit Past Medical History:  Diagnosis Date  . Allergy   . Anal carcinoma [facility]) oncologist-  dr Whitney Muse ([facility])   dx 12/ 2016 [facility] --  chemo and radiation- Dr. Marcello Moores  . Anemia      due to her cancer  . Anxiety    Dr. Harrington Challenger at [place] (psychiatrist).    . Carcinoma of ovary, stage 3 [facility]) oncologist-  dr gehrig/  dr Doreene Eland (cancer center in [place]/ [facility])--  no recurrency   dx 09/ 2015  Stage IIIB  papillary ovarian carcinoma  s/p  omentectomy and BSO &  chemotherapy (completed 12-07-2014)  . Chemotherapy induced nausea and vomiting 10/23/2014  . Chronic kidney disease    stones  . DDD (degenerative disc disease), lumbosacral   . GERD (gastroesophageal reflux disease)   . Headache(784.0)   . History of acute respiratory failure    05-30-2009  drug overdose-- (intubated for 2 days)  and aspiration pneumonia  . History of adenomatous polyp of colon 10/07/2015   tubular adenoma high grade dysplasia  . History of herpes genitalis   . History of kidney stones   . History of ovarian cancer 08/03/2015  . History of suicide attempt    per documentation in epic  05-30-2009  overdose benaodiazepine  . HTN (hypertension)    takes Metoprolol daily  . Hyperlipidemia   . Hypertension 09/27/2016  . IBS (irritable bowel syndrome)    takes Bentyl daily  . Major depression   . Mood disorder ([place])   . OA (osteoarthritis)  both hips  . Ovarian cancer ([place])    2015-TAH/BSO  . Prolapse of vaginal vault after hysterectomy 07/20/2015  . Rectovaginal fistula 08/05/2015  . Sigmoid diverticulosis   . Smokers' cough [facility])    Past Surgical History:  Procedure Laterality Date  . BREAST ENHANCEMENT SURGERY  1986  . [place]  . COLONOSCOPY N/A 10/07/2015   Procedure: COLONOSCOPY;  Surgeon: Rogene Houston, MD;  [facility];  Service: Endoscopy;  Laterality: N/A;  7:30  . EXPLORATORY LAPAROTOMY/ OMENTECTOMY/  BILATERAL SALPINGOOPHORECTOMY/  [facility] PLACEMENT  07-03-2014   [facility]  . KNEE ARTHROSCOPY Left 09-22-2004  . LAPAROSCOPY N/A 06/02/2014   Procedure: DIAGNOSTIC LAPAROSCOPY, OMENTAL BIOPSY, RIGHT OVARY BIOPSY, LYSIS OF ADHESIONS;   Surgeon: Fanny Skates, MD;  Location: [place];  Service: General;  Laterality: N/A;  . MUCOSAL ADVANCEMENT FLAP N/A 06/15/2016   Procedure: EXCISION RECTOVAGINAOL FISTULA WITH MUCOSAL ADVANCEMENT FLAP;  Surgeon: Leighton Ruff, MD;  Location: [place];  Service: General;  Laterality: N/A;  . PLACEMENT OF SETON N/A 09/09/2015   Procedure: PLACEMENT OF SETON;  Surgeon: Leighton Ruff, MD;  Location: [place];  Service: General;  Laterality: N/A;  . PORT-A-CATH REMOVAL Right 08/17/2014   Procedure: REMOVAL INTRAPERITONEAL CHEMO PORT;  Surgeon: Fanny Skates, MD;  Location: [place];  Service: General;  Laterality: Right;  . PORTACATH PLACEMENT Right 08/17/2014   Procedure:  PLACE NEW PORT A CATH;  Surgeon: Fanny Skates, MD;  Location: [place];  Service: General;  Laterality: Right;  . RECTAL BIOPSY N/A 09/09/2015   Procedure: BIOPSY OF RECTOVAGINAL MASS;  Surgeon: Leighton Ruff, MD;  Location: [place];  Service: General;  Laterality: N/A;  . TUBAL LIGATION  YRS AGO  . VAGINAL HYSTERECTOMY  1981   fibroids   Current Outpatient Medications on File Prior to Visit  Medication Sig Dispense Refill  . b complex vitamins tablet Take 1 tablet by mouth daily.    . cyclobenzaprine (FLEXERIL) 5 MG tablet Take 5 mg by mouth 3 (three) times daily as needed for muscle spasms.    . divalproex (DEPAKOTE ER) 500 MG 24 hr tablet Take 1 tablet (500 mg total) by mouth daily. 90 tablet 3  . escitalopram (LEXAPRO) 20 MG tablet Take 1 tablet (20 mg total) by mouth 2 (two) times daily. 90 tablet 3  . ketoconazole (NIZORAL) 2 % cream Apply 1 application topically daily. prn    . KRILL OIL PO Take 350 mg by mouth daily.    . lansoprazole (PREVACID) 15 MG capsule Take 15 mg by mouth as needed.     Marland Kitchen LORazepam (ATIVAN) 1 MG tablet Take 1 tablet (1 mg total) by mouth 3 (three) times daily as needed for anxiety. 90 tablet 3  . meloxicam  (MOBIC) 15 MG tablet TAKE 1 TABLET BY MOUTH EVERY DAY 30 tablet 0  . Multiple Vitamin (MULTIVITAMIN) tablet Take 1 tablet by mouth daily.    . valACYclovir (VALTREX) 1000 MG tablet Take 1 tablet (1,000 mg total) by mouth daily as needed. 5 tablet 11   No current facility-administered medications on file prior to visit.    No Known Allergies Social History   Socioeconomic History  . Marital status: Married    Spouse name: Coralyn Mark  . Number of children: 3  . Years of education: 75  . Highest education level: Not on file  Occupational History  . Occupation: retire    Comment:  [employer]  . Financial resource strain: Not on file  . Food insecurity:    Worry: Not on file    Inability: Not on file  . Transportation needs:    Medical: Not on file    Non-medical: Not on file  Tobacco Use  . Smoking status: Current Every Day Smoker    Packs/day: 0.50    Years: 43.00    Pack years: 21.50    Types: Cigarettes  . Smokeless tobacco: Never Used  Substance and Sexual Activity  . Alcohol use: No  . Drug use: No  . Sexual activity: Not Currently    Birth control/protection: Surgical  Lifestyle  . Physical activity:    Days per week: Not on file    Minutes per session: Not on file  . Stress: Not on file  Relationships  . Social connections:    Talks on phone: Not on file    Gets together: Not on file    Attends religious service: Not on file    Active member of club or organization: Not on file    Attends meetings of clubs or organizations: Not on file    Relationship status: Not on file  . Intimate partner violence:    Fear of current or ex partner: Not on file    Emotionally abused: Not on file    Physically abused: Not on file    Forced sexual activity: Not on file  Other Topics Concern  . Not on file  Social History Narrative   Lives at home with Coralyn Mark   Retired [employer]   Family History  Problem Relation Age of Onset  . Bipolar disorder Mother   .  Anxiety disorder Mother   . Dementia Mother   . Depression Mother   . Mental illness Mother   . Vision loss Mother   . Alzheimer's disease Mother   . Alcohol abuse Paternal Uncle   . Bipolar disorder Maternal Grandmother   . Dementia Maternal Grandmother   . Alzheimer's disease Maternal Grandmother   . Alcohol abuse Paternal Uncle   . Stroke Brother   . Deep vein thrombosis Son   . Heart disease Father   . Hyperlipidemia Father   . Hypertension Father   . Stroke Father   . Vision loss Father   . Atrial fibrillation Sister   . Heart disease Brother   . Heart disease Brother   . ADD / ADHD Neg Hx   . Drug abuse Neg Hx   . OCD Neg Hx   . Paranoid behavior Neg Hx   . Schizophrenia Neg Hx   . Seizures Neg Hx   . Sexual abuse Neg Hx   . Physical abuse Neg Hx       Review of Systems  All other systems reviewed and are negative.      Objective:   Physical Exam  Constitutional: She is oriented to person, place, and time. She appears well-developed and well-nourished. No distress.  HENT:  Head: Normocephalic and atraumatic.  Right Ear: External ear normal.  Left Ear: External ear normal.  Nose: Nose normal.  Mouth/Throat: Oropharynx is clear and moist. No oropharyngeal exudate.  Eyes: Pupils are equal, round, and reactive to light. Conjunctivae and EOM are normal. Right eye exhibits no discharge. Left eye exhibits no discharge. No scleral icterus.  Neck: Normal range of motion. No JVD present. No tracheal deviation present. No thyromegaly present.  Cardiovascular: Normal rate, regular rhythm and normal heart  sounds. Exam reveals no gallop and no friction rub.  No murmur heard. Pulses:      Carotid pulses are on the left side with bruit. Pulmonary/Chest: Effort normal and breath sounds normal. No stridor. No respiratory distress. She has no wheezes. She has no rales. She exhibits no tenderness.  Abdominal: Soft. Bowel sounds are normal. She exhibits no distension and no  mass. There is no tenderness. There is no rebound and no guarding. No hernia.  Musculoskeletal: She exhibits no edema.  Lymphadenopathy:    She has no cervical adenopathy.  Neurological: She is alert and oriented to person, place, and time. She displays normal reflexes. No cranial nerve deficit or sensory deficit. She exhibits normal muscle tone. Coordination normal.  Skin: Skin is warm. She is not diaphoretic.  Vitals reviewed.         Assessment & Plan:  Left carotid bruit  Anal cancer (HCC)  Epithelial ovarian cancer, FIGO stage IIIA (HCC)  Tobacco abuse counseling  Essential hypertension  Hyperlipidemia, unspecified hyperlipidemia type  History of ovarian cancer  Chronic bilateral low back pain without sciatica  Patient has a left-sided carotid bruit.  I will schedule carotid Dopplers to evaluate this further.  I strongly recommended smoking cessation.  Her blood pressure today is extremely high.  Patient would like to check her blood pressure at home over the next week and report the values to me as she believes this is anxiety related.  If blood pressures consistently greater than 140/90, I would add medication to help control her blood pressure.  I would have her return fasting so I can check a CMP, fasting lipid panel.  Goal LDL cholesterol if there is carotid stenosis is less than 70.  I believe she would benefit from a statin however I will await the results of her lipids before making that determination.  She will follow-up with her oncologist in December who is monitoring her CEA and CA 125.  She follows up with Dr. Marcello Moores who is performing her endoscopies/colonoscopies in October November.  Her mammogram is scheduled for September.  She is also battling low back pain with left-sided sciatica.  I reviewed her MRI with her that was just completed.  There is a bulging disc with left-sided nerve impingement at L4-L5 however she already has an appointment with orthopedist to  discuss the results of this.  I will defer to their management.  Spent more than 45 minutes with the patient today reviewing her history and performing her exam.  She did receive Pneumovax 23 today in clinic

## 2018-05-15 NOTE — Addendum Note (Signed)
Addended by: Shary Decamp B on: 05/15/2018 08:00 AM   Modules accepted: Orders

## 2018-05-16 ENCOUNTER — Encounter (INDEPENDENT_AMBULATORY_CARE_PROVIDER_SITE_OTHER): Payer: Self-pay | Admitting: Orthopaedic Surgery

## 2018-05-16 ENCOUNTER — Ambulatory Visit (INDEPENDENT_AMBULATORY_CARE_PROVIDER_SITE_OTHER): Payer: Medicare Other | Admitting: Orthopaedic Surgery

## 2018-05-16 VITALS — BP 123/76 | HR 84 | Ht 65.0 in | Wt 157.0 lb

## 2018-05-16 DIAGNOSIS — M9983 Other biomechanical lesions of lumbar region: Secondary | ICD-10-CM

## 2018-05-16 DIAGNOSIS — G8929 Other chronic pain: Secondary | ICD-10-CM

## 2018-05-16 DIAGNOSIS — M48061 Spinal stenosis, lumbar region without neurogenic claudication: Secondary | ICD-10-CM | POA: Insufficient documentation

## 2018-05-16 DIAGNOSIS — M545 Low back pain: Secondary | ICD-10-CM

## 2018-05-16 NOTE — Progress Notes (Signed)
Office Visit Note   Patient: Abigail Wagner           Date of Birth: 06/07/1951           MRN: [id number] Visit Date: 05/16/2018              Requested by: Susy Frizzle, MD [address] [place], [place] [address] PCP: Susy Frizzle, MD   Assessment & Plan: Visit Diagnoses:  1. Chronic left-sided low back pain, with sciatica presence unspecified   2. Neural foraminal stenosis of lumbar spine     Plan: Patient has foraminal stenosis at L4-5 on the left side which is moderate to severe.  She also has a shallow lateral disc bulge at L5-S1 which might be contributing to her symptoms.  We will set up for an epidural with Dr. Ernestina Patches on the left side at L4-5 level plus or minus L5-S1.  She has some narrowing on the right side at L3-4 but this is not symptomatic.  I will see her back after the epidural hopefully she will respond to this. Follow-Up Instructions: No follow-ups on file.   Orders:  Orders Placed This Encounter  Procedures  . Ambulatory referral to Physical Medicine Rehab   No orders of the defined types were placed in this encounter.     Procedures: No procedures performed   Clinical Data: No additional findings.   Subjective: Chief Complaint  Patient presents with  . Lower Back - Pain, Follow-up    MRI Lumbar Spine Review    HPI patient returns with ongoing pain with back pain left leg pain.  She denies any right leg pain no fever chills no bowel or bladder associated symptoms.  She is had a history of 2 primary cancers MRI is negative for metastatic disease.  MRI scan is reviewed with her today. Pain radiates from her back into her lateral hip into her left thigh.  She has used ibuprofen, CBD oil, muscle rubs ice heat, Tylenol without relief.  She continues to ambulate.  She has had previous chemotherapy and radiation. Review of Systems team point review of systems updated unchanged from 04/18/2018 other than as mentioned in  HPI.   Objective: Vital Signs: BP 123/76   Pulse 84   Ht 5\' 5"  (1.651 m)   Wt 157 lb (71.2 kg)   BMI 26.13 kg/m   Physical Exam  Constitutional: She is oriented to person, place, and time. She appears well-developed.  HENT:  Head: Normocephalic.  Right Ear: External ear normal.  Left Ear: External ear normal.  Eyes: Pupils are equal, round, and reactive to light.  Neck: No tracheal deviation present. No thyromegaly present.  Cardiovascular: Normal rate.  Pulmonary/Chest: Effort normal.  Abdominal: Soft.  Neurological: She is alert and oriented to person, place, and time.  Skin: Skin is warm and dry.  Psychiatric: She has a normal mood and affect. Her behavior is normal.    Ortho Exam patient has positive straight leg raising 80 degrees on the left negative on the right.  She has some pre-strength in hip flexors quads anterior tib gastrocsoleus.  Lower extremity reflexes are symmetrical.  Some tenderness over the greater trochanter on the left.  Negative Faber test negative logroll to the hips.  Distal pulses are 2+.  Specialty Comments:  No specialty comments available.  Imaging: CLINICAL DATA:  Low back pain radiating into the left buttock and hip. Symptoms are chronic but have worsened over the past 2  weeks.  EXAM: MRI LUMBAR SPINE WITHOUT CONTRAST  TECHNIQUE: Multiplanar, multisequence MR imaging of the lumbar spine was performed. No intravenous contrast was administered.  COMPARISON:  CT chest, abdomen and pelvis 09/20/2016.  FINDINGS: Segmentation:  Standard.  Alignment: Convex left scoliosis with the apex at L2-3 is identified. There is trace retrolisthesis L2 on L3 and anterolisthesis L5 on S1.  Vertebrae: No fracture or worrisome lesion. Degenerative endplate signal change at L2-3 is eccentric to the right.  Conus medullaris and cauda equina: Conus extends to the T12-L1 level. Conus and cauda equina appear normal.  Paraspinal and other soft  tissues: Negative.  Disc levels:  T11-12 and T12-L1 are imaged in the sagittal plane only and negative.  L1-2: Shallow disc bulge without stenosis.  L2-3: There is loss of disc space height and a bulge eccentric to the right. Moderate to moderately severe facet degenerative disease is worse on the right. There is mild narrowing in the right subarticular recess and foramen. The left foramen is open.  L3-4: Moderately severe bilateral facet degenerative change is present. The disc is uncovered with a shallow bulge. Superimposed right lateral recess protrusion with slight cephalad extension is identified. The protrusion causes narrowing in the right lateral recess which could impact the descending right L3 root. Disc and facet degenerative change also cause moderate to moderately severe right foraminal narrowing. The left foramen is open.  L4-5: There is a shallow disc bulge which is eccentric to the left, mild facet degenerative disease and ligamentum flavum thickening. There is minimal narrowing in the left lateral recess and moderate to moderately severe left foraminal narrowing. The right foramen is open.  L5-S1: Bilateral facet degenerative disease is advanced on the left. There is a shallow disc bulge to the left but the central spinal canal and neural foramina are open.  IMPRESSION: Mild narrowing in the right subarticular recess and foramen at L2-3 due to facet arthropathy and bulging disc to the right.  Disc bulge and a superimposed right lateral recess protrusion with cephalad extension at L3-4 result in mild encroachment on the descending right L4 root. Moderate to moderately severe right foraminal narrowing is also present at this level.  Bulging disc and facet degenerative change cause minimal narrowing in the left lateral recess at L4-5 and moderate to moderately severe left foraminal narrowing.   Electronically Signed   By: Inge Rise  M.D.   On: 05/08/2018 13:45   PMFS History: Patient Active Problem List   Diagnosis Date Noted  . Neural foraminal stenosis of lumbar spine 05/16/2018  . Genetic testing 05/24/2017  . HLD (hyperlipidemia) 02/07/2017  . Normocytic normochromic anemia 01/10/2017  . GERD (gastroesophageal reflux disease) 09/27/2016  . Back pain 09/27/2016  . Anxiety 09/27/2016  . Allergy 09/27/2016  . Radiation proctitis 12/23/2015  . Anal cancer ([place]) 11/19/2015  . History of ovarian cancer 08/03/2015  . Prolapse of vaginal vault after hysterectomy 07/20/2015  . Migraines 05/20/2015  . Port-A-Cath in place 01/13/2015  . Epithelial ovarian cancer, FIGO stage IIIA ([place]) 07/16/2014  . Abdominal carcinomatosis ([place]) 06/18/2014  . Bipolar 1 disorder ([place]) 12/27/2011  . Kidney stones 12/27/2011  . IBS (irritable colon syndrome) 12/27/2011  . Depression 11/07/2011   Past Medical History:  Diagnosis Date  . Allergy   . Anal carcinoma [facility]) oncologist-  dr Whitney Muse ([facility])   dx 12/ 2016 [facility] --  chemo and radiation- Dr. Marcello Moores  . Anemia    due to her cancer  . Anxiety  Dr. Harrington Challenger at [place] (psychiatrist).    . Carcinoma of ovary, stage 3 [facility]) oncologist-  dr gehrig/  dr Doreene Eland (cancer center in [place] [facility]/ [facility])--  no recurrency   dx 09/ 2015  Stage IIIB  papillary ovarian carcinoma  s/p  omentectomy and BSO &  chemotherapy (completed 12-07-2014)  . Chemotherapy induced nausea and vomiting 10/23/2014  . Chronic kidney disease    stones  . DDD (degenerative disc disease), lumbosacral   . GERD (gastroesophageal reflux disease)   . Headache(784.0)   . History of acute respiratory failure    05-30-2009  drug overdose-- (intubated for 2 days)  and aspiration pneumonia  . History of adenomatous polyp of colon 10/07/2015   tubular adenoma high grade dysplasia  . History of herpes genitalis   . History of kidney stones   . History of ovarian cancer 08/03/2015  . History of suicide  attempt    per documentation in epic  05-30-2009  overdose benaodiazepine  . HTN (hypertension)    takes Metoprolol daily  . Hyperlipidemia   . Hypertension 09/27/2016  . IBS (irritable bowel syndrome)    takes Bentyl daily  . Major depression   . Mood disorder ([place])   . OA (osteoarthritis)    both hips  . Ovarian cancer ([place])    2015-TAH/BSO  . Prolapse of vaginal vault after hysterectomy 07/20/2015  . Rectovaginal fistula 08/05/2015  . Sigmoid diverticulosis   . Smokers' cough ([place])     Family History  Problem Relation Age of Onset  . Bipolar disorder Mother   . Anxiety disorder Mother   . Dementia Mother   . Depression Mother   . Mental illness Mother   . Vision loss Mother   . Alzheimer's disease Mother   . Alcohol abuse Paternal Uncle   . Bipolar disorder Maternal Grandmother   . Dementia Maternal Grandmother   . Alzheimer's disease Maternal Grandmother   . Alcohol abuse Paternal Uncle   . Stroke Brother   . Deep vein thrombosis Son   . Heart disease Father   . Hyperlipidemia Father   . Hypertension Father   . Stroke Father   . Vision loss Father   . Atrial fibrillation Sister   . Heart disease Brother   . Heart disease Brother   . ADD / ADHD Neg Hx   . Drug abuse Neg Hx   . OCD Neg Hx   . Paranoid behavior Neg Hx   . Schizophrenia Neg Hx   . Seizures Neg Hx   . Sexual abuse Neg Hx   . Physical abuse Neg Hx     Past Surgical History:  Procedure Laterality Date  . BREAST ENHANCEMENT SURGERY  1986  . [place]  . COLONOSCOPY N/A 10/07/2015   Procedure: COLONOSCOPY;  Surgeon: Rogene Houston, MD;  [facility];  Service: Endoscopy;  Laterality: N/A;  7:30  . EXPLORATORY LAPAROTOMY/ OMENTECTOMY/  BILATERAL SALPINGOOPHORECTOMY/  [facility] PLACEMENT  07-03-2014   [facility]  . KNEE ARTHROSCOPY Left 09-22-2004  . LAPAROSCOPY N/A 06/02/2014   Procedure: DIAGNOSTIC LAPAROSCOPY, OMENTAL BIOPSY, RIGHT OVARY BIOPSY, LYSIS OF ADHESIONS;   Surgeon: Fanny Skates, MD;  Location: [place];  Service: General;  Laterality: N/A;  . MUCOSAL ADVANCEMENT FLAP N/A 06/15/2016   Procedure: EXCISION RECTOVAGINAOL FISTULA WITH MUCOSAL ADVANCEMENT FLAP;  Surgeon: Leighton Ruff, MD;  Location: [place];  Service: General;  Laterality: N/A;  . PLACEMENT OF SETON N/A 09/09/2015   Procedure:  PLACEMENT OF SETON;  Surgeon: Leighton Ruff, MD;  Location: [facility];  Service: General;  Laterality: N/A;  . PORT-A-CATH REMOVAL Right 08/17/2014   Procedure: REMOVAL INTRAPERITONEAL CHEMO PORT;  Surgeon: Fanny Skates, MD;  Location: [place];  Service: General;  Laterality: Right;  . PORTACATH PLACEMENT Right 08/17/2014   Procedure:  PLACE NEW PORT A CATH;  Surgeon: Fanny Skates, MD;  Location: [place];  Service: General;  Laterality: Right;  . RECTAL BIOPSY N/A 09/09/2015   Procedure: BIOPSY OF RECTOVAGINAL MASS;  Surgeon: Leighton Ruff, MD;  Location: [place];  Service: General;  Laterality: N/A;  . TUBAL LIGATION  YRS AGO  . VAGINAL HYSTERECTOMY  1981   fibroids   Social History   Occupational History  . Occupation: retire    Comment: [employer]  Tobacco Use  . Smoking status: Current Every Day Smoker    Packs/day: 0.50    Years: 43.00    Pack years: 21.50    Types: Cigarettes  . Smokeless tobacco: Never Used  Substance and Sexual Activity  . Alcohol use: No  . Drug use: No  . Sexual activity: Not Currently    Birth control/protection: Surgical

## 2018-05-20 ENCOUNTER — Encounter: Payer: Self-pay | Admitting: Family Medicine

## 2018-05-21 ENCOUNTER — Other Ambulatory Visit: Payer: Medicare Other

## 2018-05-21 ENCOUNTER — Other Ambulatory Visit: Payer: Self-pay | Admitting: Family Medicine

## 2018-05-21 DIAGNOSIS — I10 Essential (primary) hypertension: Secondary | ICD-10-CM | POA: Diagnosis not present

## 2018-05-21 DIAGNOSIS — E785 Hyperlipidemia, unspecified: Secondary | ICD-10-CM | POA: Diagnosis not present

## 2018-05-21 LAB — COMPREHENSIVE METABOLIC PANEL
AG Ratio: 1.6 (calc) (ref 1.0–2.5)
ALT: 11 U/L (ref 6–29)
AST: 19 U/L (ref 10–35)
Albumin: 4.2 g/dL (ref 3.6–5.1)
Alkaline phosphatase (APISO): 46 U/L (ref 33–130)
BILIRUBIN TOTAL: 0.3 mg/dL (ref 0.2–1.2)
BUN: 15 mg/dL (ref 7–25)
CO2: 30 mmol/L (ref 20–32)
Calcium: 9.6 mg/dL (ref 8.6–10.4)
Chloride: 97 mmol/L — ABNORMAL LOW (ref 98–110)
Creat: 0.75 mg/dL (ref 0.50–0.99)
GLUCOSE: 103 mg/dL — AB (ref 65–99)
Globulin: 2.7 g/dL (calc) (ref 1.9–3.7)
Potassium: 4.3 mmol/L (ref 3.5–5.3)
SODIUM: 137 mmol/L (ref 135–146)
TOTAL PROTEIN: 6.9 g/dL (ref 6.1–8.1)

## 2018-05-21 LAB — LIPID PANEL
Cholesterol: 225 mg/dL — ABNORMAL HIGH (ref <200)
HDL: 53 mg/dL (ref >50)
LDL CHOLESTEROL (CALC): 153 mg/dL — AB
Non-HDL Cholesterol (Calc): 172 mg/dL (calc) — ABNORMAL HIGH (ref <130)
TRIGLYCERIDES: 85 mg/dL (ref <150)
Total CHOL/HDL Ratio: 4.2 (calc) (ref <5.0)

## 2018-05-21 LAB — EXTRA LAV TOP TUBE

## 2018-05-21 MED ORDER — LOSARTAN POTASSIUM 50 MG PO TABS
50.0000 mg | ORAL_TABLET | Freq: Every day | ORAL | 3 refills | Status: DC
Start: 2018-05-21 — End: 2019-05-01

## 2018-05-23 MED ORDER — ATORVASTATIN CALCIUM 20 MG PO TABS: 20.0000 mg | ORAL_TABLET | Freq: Every day | ORAL | 1 refills | Status: DC

## 2018-05-23 NOTE — Telephone Encounter (Signed)
Med sent to pharm 

## 2018-05-31 ENCOUNTER — Inpatient Hospital Stay (HOSPITAL_COMMUNITY): Payer: Medicare Other | Attending: Internal Medicine

## 2018-05-31 ENCOUNTER — Encounter (HOSPITAL_COMMUNITY): Payer: Self-pay

## 2018-05-31 DIAGNOSIS — Z452 Encounter for adjustment and management of vascular access device: Secondary | ICD-10-CM | POA: Diagnosis not present

## 2018-05-31 DIAGNOSIS — Z85048 Personal history of other malignant neoplasm of rectum, rectosigmoid junction, and anus: Secondary | ICD-10-CM | POA: Insufficient documentation

## 2018-05-31 DIAGNOSIS — Z8543 Personal history of malignant neoplasm of ovary: Secondary | ICD-10-CM | POA: Insufficient documentation

## 2018-05-31 MED ORDER — HEPARIN SOD (PORK) LOCK FLUSH 100 UNIT/ML IV SOLN
500.0000 [IU] | Freq: Once | INTRAVENOUS | Status: AC
  Administered 2018-05-31: 500 [IU] via INTRAVENOUS

## 2018-05-31 MED ORDER — SODIUM CHLORIDE 0.9% FLUSH
10.0000 mL | INTRAVENOUS | Status: DC | PRN
  Administered 2018-05-31: 10 mL via INTRAVENOUS
  Filled 2018-05-31: qty 10

## 2018-05-31 NOTE — Patient Instructions (Signed)
[facility] at [facility] Discharge Instructions  Portacath flushed per protocol today. Follow-up as scheduled. Call clinic for any questions or concerns   Thank you for choosing [facility] at [facility] to provide your oncology and hematology care.  To afford each patient quality time with our provider, please arrive at least 15 minutes before your scheduled appointment time.   If you have a lab appointment with the Cancer Center please come in thru the  Main Entrance and check in at the main information desk  You need to re-schedule your appointment should you arrive 10 or more minutes late.  We strive to give you quality time with our providers, and arriving late affects you and other patients whose appointments are after yours.  Also, if you no show three or more times for appointments you may be dismissed from the clinic at the providers discretion.     Again, thank you for choosing [facility].  Our hope is that these requests will decrease the amount of time that you wait before being seen by our physicians.       _____________________________________________________________  Should you have questions after your visit to [facility], please contact our office at [phone] between the hours of 8:00 a.m. and 4:30 p.m.  Voicemails left after 4:00 p.m. will not be returned until the following business day.  For prescription refill requests, have your pharmacy contact our office and allow 72 hours.    Cancer Center Support Programs:   > Cancer Support Group  2nd Tuesday of the month 1pm-2pm, Journey Room   

## 2018-05-31 NOTE — Progress Notes (Signed)
Abigail Wagner tolerated portacath flush well without complaints or incident. Port accessed with 20 gauge needle with blood return noted then flushed with 10 ml NS and 5 ml Heparin easily per protocol then de-accessed. VSS Pt discharged self ambulatory in satisfactory condition

## 2018-06-03 ENCOUNTER — Encounter (INDEPENDENT_AMBULATORY_CARE_PROVIDER_SITE_OTHER): Payer: Self-pay | Admitting: Physical Medicine and Rehabilitation

## 2018-06-03 ENCOUNTER — Ambulatory Visit (INDEPENDENT_AMBULATORY_CARE_PROVIDER_SITE_OTHER): Payer: Self-pay

## 2018-06-03 ENCOUNTER — Ambulatory Visit (INDEPENDENT_AMBULATORY_CARE_PROVIDER_SITE_OTHER): Payer: Medicare Other | Admitting: Physical Medicine and Rehabilitation

## 2018-06-03 VITALS — BP 127/68 | HR 83 | Temp 98.0°F

## 2018-06-03 DIAGNOSIS — M5416 Radiculopathy, lumbar region: Secondary | ICD-10-CM

## 2018-06-03 DIAGNOSIS — M48061 Spinal stenosis, lumbar region without neurogenic claudication: Secondary | ICD-10-CM

## 2018-06-03 MED ORDER — BETAMETHASONE SOD PHOS & ACET 6 (3-3) MG/ML IJ SUSP
12.0000 mg | Freq: Once | INTRAMUSCULAR | Status: AC
  Administered 2018-06-03: 12 mg

## 2018-06-03 NOTE — Patient Instructions (Signed)

## 2018-06-03 NOTE — Progress Notes (Signed)
Abigail Wagner - 67 y.o. female MRN [id number]  Date of birth: 01-15-1951  Office Visit Note: Visit Date: 06/03/2018 PCP: Susy Frizzle, MD Referred by: Susy Frizzle, MD  Subjective: Chief Complaint  Patient presents with  . Lower Back - Pain   HPI: Abigail Wagner is a 67 year old female with chronic worsening severe left low back and hip and posterior lateral lateral leg pain.  Dr. Inda Merlin request diagnostic and therapeutic left L4 transforaminal injection.  MRI shows more significant findings on the right if patient's pain is on the left.  She has foraminal stenosis on the left at L4-5.  She has very small disc problem a little bit left-sided at L5-S1.  Some of her pain does seem to be facet mediated pain as well.  We will complete a left L4 transforaminal injection today diagnostically and therapeutically.  We spoke with the patient at length today.  If she does not get relief would look at facet joint block.  She will follow-up with Dr. Inda Merlin in the [place] office.   ROS Otherwise per HPI.  Assessment & Plan: Visit Diagnoses:  1. Lumbar radiculopathy   2. Spinal stenosis of lumbar region without neurogenic claudication     Plan: No additional findings.   Meds & Orders:  Meds ordered this encounter  Medications  . betamethasone acetate-betamethasone sodium phosphate (CELESTONE) injection 12 mg    Orders Placed This Encounter  Procedures  . XR C-ARM NO REPORT  . Epidural Steroid injection    Follow-up: Return in about 4 weeks (around 07/01/2018), or if symptoms worsen or fail to improve, for Rodell Perna, M.D..   Procedures: No procedures performed  Lumbosacral Transforaminal Epidural Steroid Injection - Sub-Pedicular Approach with Fluoroscopic Guidance  Patient: Abigail Wagner      Date of Birth: 11-12-1950 MRN: [id number] PCP: Susy Frizzle, MD      Visit Date: 06/03/2018   Universal Protocol:    Date/Time: 06/03/2018  Consent Given By: the patient  Position:  PRONE  Additional Comments: Vital signs were monitored before and after the procedure. Patient was prepped and draped in the usual sterile fashion. The correct patient, procedure, and site was verified.   Injection Procedure Details:  Procedure Site One Meds Administered:  Meds ordered this encounter  Medications  . betamethasone acetate-betamethasone sodium phosphate (CELESTONE) injection 12 mg    Laterality: Left  Location/Site:  L4-L5  Needle size: 22 G  Needle type: Spinal  Needle Placement: Transforaminal  Findings:    -Comments: Excellent flow of contrast along the nerve and into the epidural space.  Procedure Details: After squaring off the end-plates to get a true AP view, the C-arm was positioned so that an oblique view of the foramen as noted above was visualized. The target area is just inferior to the "nose of the scotty dog" or sub pedicular. The soft tissues overlying this structure were infiltrated with 2-3 ml. of 1% Lidocaine without Epinephrine.  The spinal needle was inserted toward the target using a "trajectory" view along the fluoroscope beam.  Under AP and lateral visualization, the needle was advanced so it did not puncture dura and was located close the 6 O'Clock position of the pedical in AP tracterory. Biplanar projections were used to confirm position. Aspiration was confirmed to be negative for CSF and/or blood. A 1-2 ml. volume of Isovue-250 was injected and flow of contrast was noted at each level. Radiographs were obtained for documentation purposes.   After attaining the  desired flow of contrast documented above, a 0.5 to 1.0 ml test dose of 0.25% Marcaine was injected into each respective transforaminal space.  The patient was observed for 90 seconds post injection.  After no sensory deficits were reported, and normal lower extremity motor function was noted,   the above injectate was administered so that equal amounts of the injectate were placed  at each foramen (level) into the transforaminal epidural space.   Additional Comments:  The patient tolerated the procedure well Dressing: Band-Aid    Post-procedure details: Patient was observed during the procedure. Post-procedure instructions were reviewed.  Patient left the clinic in stable condition.    Clinical History: MRI LUMBAR SPINE WITHOUT CONTRAST  TECHNIQUE: Multiplanar, multisequence MR imaging of the lumbar spine was performed. No intravenous contrast was administered.  COMPARISON:  CT chest, abdomen and pelvis 09/20/2016.  FINDINGS: Segmentation:  Standard.  Alignment: Convex left scoliosis with the apex at L2-3 is identified. There is trace retrolisthesis L2 on L3 and anterolisthesis L5 on S1.  Vertebrae: No fracture or worrisome lesion. Degenerative endplate signal change at L2-3 is eccentric to the right.  Conus medullaris and cauda equina: Conus extends to the T12-L1 level. Conus and cauda equina appear normal.  Paraspinal and other soft tissues: Negative.  Disc levels:  T11-12 and T12-L1 are imaged in the sagittal plane only and negative.  L1-2: Shallow disc bulge without stenosis.  L2-3: There is loss of disc space height and a bulge eccentric to the right. Moderate to moderately severe facet degenerative disease is worse on the right. There is mild narrowing in the right subarticular recess and foramen. The left foramen is open.  L3-4: Moderately severe bilateral facet degenerative change is present. The disc is uncovered with a shallow bulge. Superimposed right lateral recess protrusion with slight cephalad extension is identified. The protrusion causes narrowing in the right lateral recess which could impact the descending right L3 root. Disc and facet degenerative change also cause moderate to moderately severe right foraminal narrowing. The left foramen is open.  L4-5: There is a shallow disc bulge which is eccentric  to the left, mild facet degenerative disease and ligamentum flavum thickening. There is minimal narrowing in the left lateral recess and moderate to moderately severe left foraminal narrowing. The right foramen is open.  L5-S1: Bilateral facet degenerative disease is advanced on the left. There is a shallow disc bulge to the left but the central spinal canal and neural foramina are open.  IMPRESSION: Mild narrowing in the right subarticular recess and foramen at L2-3 due to facet arthropathy and bulging disc to the right.  Disc bulge and a superimposed right lateral recess protrusion with cephalad extension at L3-4 result in mild encroachment on the descending right L4 root. Moderate to moderately severe right foraminal narrowing is also present at this level.  Bulging disc and facet degenerative change cause minimal narrowing in the left lateral recess at L4-5 and moderate to moderately severe left foraminal narrowing.   Electronically Signed   By: Inge Rise M.D.   On: 05/08/2018 13:45   She reports that she has been smoking cigarettes. She has a 21.50 pack-year smoking history. She has never used smokeless tobacco. No results for input(s): HGBA1C, LABURIC in the last 8760 hours.  Objective:  VS:  HT:    WT:   BMI:     BP:127/68  HR:83bpm  TEMP:98 F (36.7 C)(Oral)  RESP:98 % Physical Exam  Ortho Exam Imaging: Xr C-arm No Report  Result Date: 06/03/2018 Please see Notes tab for imaging impression.   Past Medical/Family/Surgical/Social History: Medications & Allergies reviewed per EMR, new medications updated. Patient Active Problem List   Diagnosis Date Noted  . Neural foraminal stenosis of lumbar spine 05/16/2018  . Genetic testing 05/24/2017  . HLD (hyperlipidemia) 02/07/2017  . Normocytic normochromic anemia 01/10/2017  . GERD (gastroesophageal reflux disease) 09/27/2016  . Back pain 09/27/2016  . Anxiety 09/27/2016  . Allergy 09/27/2016  .  Radiation proctitis 12/23/2015  . Anal cancer ([place]) 11/19/2015  . History of ovarian cancer 08/03/2015  . Prolapse of vaginal vault after hysterectomy 07/20/2015  . Migraines 05/20/2015  . Port-A-Cath in place 01/13/2015  . Epithelial ovarian cancer, FIGO stage IIIA ([place]) 07/16/2014  . Abdominal carcinomatosis ([place]) 06/18/2014  . Bipolar 1 disorder ([place]) 12/27/2011  . Kidney stones 12/27/2011  . IBS (irritable colon syndrome) 12/27/2011  . Depression 11/07/2011   Past Medical History:  Diagnosis Date  . Allergy   . Anal carcinoma [facility]) oncologist-  dr Whitney Muse ([facility])   dx 12/ 2016 [facility] --  chemo and radiation- Dr. Marcello Moores  . Anemia    due to her cancer  . 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    . Carcinoma of ovary, stage 3 [facility]) oncologist-  dr gehrig/  dr Doreene Eland (cancer center in [place] w/ [facility])--  no recurrency   dx 09/ 2015  Stage IIIB  papillary ovarian carcinoma  s/p  omentectomy and BSO &  chemotherapy (completed 12-07-2014)  . Chemotherapy induced nausea and vomiting 10/23/2014  . Chronic kidney disease    stones  . DDD (degenerative disc disease), lumbosacral   . GERD (gastroesophageal reflux disease)   . Headache(784.0)   . History of acute respiratory failure    05-30-2009  drug overdose-- (intubated for 2 days)  and aspiration pneumonia  . History of adenomatous polyp of colon 10/07/2015   tubular adenoma high grade dysplasia  . History of herpes genitalis   . History of kidney stones   . History of ovarian cancer 08/03/2015  . History of suicide attempt    per documentation in epic  05-30-2009  overdose benaodiazepine  . HTN (hypertension)    takes Metoprolol daily  . Hyperlipidemia   . Hypertension 09/27/2016  . IBS (irritable bowel syndrome)    takes Bentyl daily  . Major depression   . Mood disorder ([place])   . OA (osteoarthritis)    both hips  . Ovarian cancer ([place])    2015-TAH/BSO  . Prolapse of vaginal vault after hysterectomy  07/20/2015  . Rectovaginal fistula 08/05/2015  . Sigmoid diverticulosis   . Smokers' cough ([place])    Family History  Problem Relation Age of Onset  . Bipolar disorder Mother   . Anxiety disorder Mother   . Dementia Mother   . Depression Mother   . Mental illness Mother   . Vision loss Mother   . Alzheimer's disease Mother   . Alcohol abuse Paternal Uncle   . Bipolar disorder Maternal Grandmother   . Dementia Maternal Grandmother   . Alzheimer's disease Maternal Grandmother   . Alcohol abuse Paternal Uncle   . Stroke Brother   . Deep vein thrombosis Son   . Heart disease Father   . Hyperlipidemia Father   . Hypertension Father   . Stroke Father   . Vision loss Father   . Atrial fibrillation Sister   . Heart disease Brother   . Heart disease Brother   .  ADD / ADHD Neg Hx   . Drug abuse Neg Hx   . OCD Neg Hx   . Paranoid behavior Neg Hx   . Schizophrenia Neg Hx   . Seizures Neg Hx   . Sexual abuse Neg Hx   . Physical abuse Neg Hx    Past Surgical History:  Procedure Laterality Date  . BREAST ENHANCEMENT SURGERY  1986  . [place]  . COLONOSCOPY N/A 10/07/2015   Procedure: COLONOSCOPY;  Surgeon: Rogene Houston, MD;  [facility];  Service: Endoscopy;  Laterality: N/A;  7:30  . EXPLORATORY LAPAROTOMY/ OMENTECTOMY/  BILATERAL SALPINGOOPHORECTOMY/  [facility] PLACEMENT  07-03-2014   [facility]  . KNEE ARTHROSCOPY Left 09-22-2004  . LAPAROSCOPY N/A 06/02/2014   Procedure: DIAGNOSTIC LAPAROSCOPY, OMENTAL BIOPSY, RIGHT OVARY BIOPSY, LYSIS OF ADHESIONS;  Surgeon: Fanny Skates, MD;  Location: [place];  Service: General;  Laterality: N/A;  . MUCOSAL ADVANCEMENT FLAP N/A 06/15/2016   Procedure: EXCISION RECTOVAGINAOL FISTULA WITH MUCOSAL ADVANCEMENT FLAP;  Surgeon: Leighton Ruff, MD;  Location: [place];  Service: General;  Laterality: N/A;  . PLACEMENT OF SETON N/A 09/09/2015   Procedure: PLACEMENT OF SETON;  Surgeon: Leighton Ruff, MD;   Location: [place];  Service: General;  Laterality: N/A;  . PORT-A-CATH REMOVAL Right 08/17/2014   Procedure: REMOVAL INTRAPERITONEAL CHEMO PORT;  Surgeon: Fanny Skates, MD;  Location: [place];  Service: General;  Laterality: Right;  . PORTACATH PLACEMENT Right 08/17/2014   Procedure:  PLACE NEW PORT A CATH;  Surgeon: Fanny Skates, MD;  Location: [place];  Service: General;  Laterality: Right;  . RECTAL BIOPSY N/A 09/09/2015   Procedure: BIOPSY OF RECTOVAGINAL MASS;  Surgeon: Leighton Ruff, MD;  Location: [place];  Service: General;  Laterality: N/A;  . TUBAL LIGATION  YRS AGO  . VAGINAL HYSTERECTOMY  1981   fibroids   Social History   Occupational History  . Occupation: retire    Comment: [employer]  Tobacco Use  . Smoking status: Current Every Day Smoker    Packs/day: 0.50    Years: 43.00    Pack years: 21.50    Types: Cigarettes  . Smokeless tobacco: Never Used  Substance and Sexual Activity  . Alcohol use: No  . Drug use: No  . Sexual activity: Not Currently    Birth control/protection: Surgical

## 2018-06-03 NOTE — Procedures (Signed)
Lumbosacral Transforaminal Epidural Steroid Injection - Sub-Pedicular Approach with Fluoroscopic Guidance  Patient: Abigail Wagner      Date of Birth: September 08, 1951 MRN: [id number] PCP: Susy Frizzle, MD      Visit Date: 06/03/2018   Universal Protocol:    Date/Time: 06/03/2018  Consent Given By: the patient  Position: PRONE  Additional Comments: Vital signs were monitored before and after the procedure. Patient was prepped and draped in the usual sterile fashion. The correct patient, procedure, and site was verified.   Injection Procedure Details:  Procedure Site One Meds Administered:  Meds ordered this encounter  Medications  . betamethasone acetate-betamethasone sodium phosphate (CELESTONE) injection 12 mg    Laterality: Left  Location/Site:  L4-L5  Needle size: 22 G  Needle type: Spinal  Needle Placement: Transforaminal  Findings:    -Comments: Excellent flow of contrast along the nerve and into the epidural space.  Procedure Details: After squaring off the end-plates to get a true AP view, the C-arm was positioned so that an oblique view of the foramen as noted above was visualized. The target area is just inferior to the "nose of the scotty dog" or sub pedicular. The soft tissues overlying this structure were infiltrated with 2-3 ml. of 1% Lidocaine without Epinephrine.  The spinal needle was inserted toward the target using a "trajectory" view along the fluoroscope beam.  Under AP and lateral visualization, the needle was advanced so it did not puncture dura and was located close the 6 O'Clock position of the pedical in AP tracterory. Biplanar projections were used to confirm position. Aspiration was confirmed to be negative for CSF and/or blood. A 1-2 ml. volume of Isovue-250 was injected and flow of contrast was noted at each level. Radiographs were obtained for documentation purposes.   After attaining the desired flow of contrast documented above, a 0.5 to 1.0  ml test dose of 0.25% Marcaine was injected into each respective transforaminal space.  The patient was observed for 90 seconds post injection.  After no sensory deficits were reported, and normal lower extremity motor function was noted,   the above injectate was administered so that equal amounts of the injectate were placed at each foramen (level) into the transforaminal epidural space.   Additional Comments:  The patient tolerated the procedure well Dressing: Band-Aid    Post-procedure details: Patient was observed during the procedure. Post-procedure instructions were reviewed.  Patient left the clinic in stable condition.

## 2018-06-03 NOTE — Progress Notes (Signed)
 .  Numeric Pain Rating Scale and Functional Assessment Average Pain 6   In the last MONTH (on 0-10 scale) has pain interfered with the following?  1. General activity like being  able to carry out your everyday physical activities such as walking, climbing stairs, carrying groceries, or moving a chair?  Rating(6)   +Driver, -BT, -Dye Allergies.  

## 2018-06-10 ENCOUNTER — Other Ambulatory Visit: Payer: Self-pay | Admitting: Family Medicine

## 2018-06-10 ENCOUNTER — Encounter: Payer: Self-pay | Admitting: Family Medicine

## 2018-06-11 ENCOUNTER — Ambulatory Visit (HOSPITAL_COMMUNITY)
Admission: RE | Admit: 2018-06-11 | Discharge: 2018-06-11 | Disposition: A | Discharge status: Home / Self Care | Payer: Medicare Other | Source: Ambulatory Visit | Attending: Family Medicine | Admitting: Family Medicine

## 2018-06-11 ENCOUNTER — Encounter: Payer: Self-pay | Admitting: Family Medicine

## 2018-06-11 ENCOUNTER — Encounter (HOSPITAL_COMMUNITY): Payer: Self-pay | Admitting: Psychiatry

## 2018-06-11 ENCOUNTER — Other Ambulatory Visit: Payer: Self-pay | Admitting: Family Medicine

## 2018-06-11 ENCOUNTER — Ambulatory Visit (HOSPITAL_COMMUNITY): Payer: Medicare Other | Admitting: Psychiatry

## 2018-06-11 DIAGNOSIS — I6522 Occlusion and stenosis of left carotid artery: Secondary | ICD-10-CM | POA: Insufficient documentation

## 2018-06-11 DIAGNOSIS — I6523 Occlusion and stenosis of bilateral carotid arteries: Secondary | ICD-10-CM | POA: Diagnosis not present

## 2018-06-11 DIAGNOSIS — F331 Major depressive disorder, recurrent, moderate: Secondary | ICD-10-CM

## 2018-06-11 DIAGNOSIS — R0989 Other specified symptoms and signs involving the circulatory and respiratory systems: Secondary | ICD-10-CM | POA: Insufficient documentation

## 2018-06-11 DIAGNOSIS — F17210 Nicotine dependence, cigarettes, uncomplicated: Secondary | ICD-10-CM

## 2018-06-11 MED ORDER — MELOXICAM 15 MG PO TABS: 15.0000 mg | ORAL_TABLET | Freq: Every day | ORAL | 2 refills | Status: DC

## 2018-06-11 MED ORDER — LORAZEPAM 1 MG PO TABS: 1.0000 mg | ORAL_TABLET | Freq: Three times a day (TID) | ORAL | 3 refills | Status: DC | PRN

## 2018-06-11 MED ORDER — DIVALPROEX SODIUM ER 500 MG PO TB24: 500.0000 mg | ORAL_TABLET | Freq: Every day | ORAL | 3 refills | Status: DC

## 2018-06-11 MED ORDER — ESCITALOPRAM OXALATE 20 MG PO TABS: 20.0000 mg | ORAL_TABLET | Freq: Two times a day (BID) | ORAL | 3 refills | Status: DC

## 2018-06-11 NOTE — Progress Notes (Signed)
[place] MD/PA/NP OP Progress Note  06/11/2018 9:45 AM Abigail Wagner  MRN:  [id number]  Chief Complaint:  Chief Complaint    Depression; Anxiety; Follow-up     HPI: This patient is a 67 year old married white female lives with her husband in [place]. She has 3 children and 5 grandchildren. She is retired from [employer].  The patient states she's had depression since her 41s. She was hospitalized several times, last time being in 2010 when she took a drug overdose. She was going through a lot of family stress back then. She states that she was hospitalized at [facility] and since then she has never wanted to go back. She's been quite stable despite her low doses of medication. She's also stopped drinking alcohol which is made a big difference.  The patient returns after 3 months.  In general she has been doing quite well.  Her health is been stable and she has a new primary doctor at [facility].  She is now on cholesterol medication as well as medicine for hypertension.  She is having a carotid Doppler done today.  Her mood is good she is sleeping well her energy is good and she enjoyed a vacation to the mountains this summer.  She does not have any specific complaints other than chronic hip pain. Visit Diagnosis:    ICD-10-CM   1. Major depressive disorder, recurrent episode, moderate (HCC) F33.1 escitalopram (LEXAPRO) 20 MG tablet    Past Psychiatric History: Past hospitalizations for depression and alcohol abuse, none since 2010  Past Medical History:  Past Medical History:  Diagnosis Date  . Allergy   . Anal carcinoma [facility]) oncologist-  dr [facility] ([facility])   dx 12/ 2016 [facility] --  chemo and radiation- Dr. Marcello Moores  . Anemia    due to her cancer  . Anxiety    Dr. Harrington Challenger at [facility] (psychiatrist).    . Carcinoma of ovary, stage 3 [facility]) oncologist-  dr gehrig/  dr Doreene Eland (cancer center in [place]/ [facility])--  no recurrency   dx 09/ 2015  Stage IIIB   papillary ovarian carcinoma  s/p  omentectomy and BSO &  chemotherapy (completed 12-07-2014)  . Chemotherapy induced nausea and vomiting 10/23/2014  . Chronic kidney disease    stones  . DDD (degenerative disc disease), lumbosacral   . GERD (gastroesophageal reflux disease)   . Headache(784.0)   . History of acute respiratory failure    05-30-2009  drug overdose-- (intubated for 2 days)  and aspiration pneumonia  . History of adenomatous polyp of colon 10/07/2015   tubular adenoma high grade dysplasia  . History of herpes genitalis   . History of kidney stones   . History of ovarian cancer 08/03/2015  . History of suicide attempt    per documentation in epic  05-30-2009  overdose benaodiazepine  . HTN (hypertension)    takes Metoprolol daily  . Hyperlipidemia   . Hypertension 09/27/2016  . IBS (irritable bowel syndrome)    takes Bentyl daily  . Major depression   . Mood disorder ([place])   . OA (osteoarthritis)    both hips  . Ovarian cancer ([place])    2015-TAH/BSO  . Prolapse of vaginal vault after hysterectomy 07/20/2015  . Rectovaginal fistula 08/05/2015  . Sigmoid diverticulosis   . Smokers' cough [facility])     Past Surgical History:  Procedure Laterality Date  . BREAST ENHANCEMENT SURGERY  1986  . [place]  .  COLONOSCOPY N/A 10/07/2015   Procedure: COLONOSCOPY;  Surgeon: Rogene Houston, MD;  [facility];  Service: Endoscopy;  Laterality: N/A;  7:30  . EXPLORATORY LAPAROTOMY/ OMENTECTOMY/  BILATERAL SALPINGOOPHORECTOMY/  [facility] PLACEMENT  07-03-2014   [facility]  . KNEE ARTHROSCOPY Left 09-22-2004  . LAPAROSCOPY N/A 06/02/2014   Procedure: DIAGNOSTIC LAPAROSCOPY, OMENTAL BIOPSY, RIGHT OVARY BIOPSY, LYSIS OF ADHESIONS;  Surgeon: Fanny Skates, MD;  Location: [place];  Service: General;  Laterality: N/A;  . MUCOSAL ADVANCEMENT FLAP N/A 06/15/2016   Procedure: EXCISION RECTOVAGINAOL FISTULA WITH MUCOSAL ADVANCEMENT FLAP;  Surgeon: Leighton Ruff, MD;   Location: [place];  Service: General;  Laterality: N/A;  . PLACEMENT OF SETON N/A 09/09/2015   Procedure: PLACEMENT OF SETON;  Surgeon: Leighton Ruff, MD;  Location: [place];  Service: General;  Laterality: N/A;  . PORT-A-CATH REMOVAL Right 08/17/2014   Procedure: REMOVAL INTRAPERITONEAL CHEMO PORT;  Surgeon: Fanny Skates, MD;  Location: [place];  Service: General;  Laterality: Right;  . PORTACATH PLACEMENT Right 08/17/2014   Procedure:  PLACE NEW PORT A CATH;  Surgeon: Fanny Skates, MD;  Location: [place];  Service: General;  Laterality: Right;  . RECTAL BIOPSY N/A 09/09/2015   Procedure: BIOPSY OF RECTOVAGINAL MASS;  Surgeon: Leighton Ruff, MD;  Location: [place];  Service: General;  Laterality: N/A;  . TUBAL LIGATION  YRS AGO  . VAGINAL HYSTERECTOMY  1981   fibroids    Family Psychiatric History: See below  Family History:  Family History  Problem Relation Age of Onset  . Bipolar disorder Mother   . Anxiety disorder Mother   . Dementia Mother   . Depression Mother   . Mental illness Mother   . Vision loss Mother   . Alzheimer's disease Mother   . Alcohol abuse Paternal Uncle   . Bipolar disorder Maternal Grandmother   . Dementia Maternal Grandmother   . Alzheimer's disease Maternal Grandmother   . Alcohol abuse Paternal Uncle   . Stroke Brother   . Deep vein thrombosis Son   . Heart disease Father   . Hyperlipidemia Father   . Hypertension Father   . Stroke Father   . Vision loss Father   . Atrial fibrillation Sister   . Heart disease Brother   . Heart disease Brother   . ADD / ADHD Neg Hx   . Drug abuse Neg Hx   . OCD Neg Hx   . Paranoid behavior Neg Hx   . Schizophrenia Neg Hx   . Seizures Neg Hx   . Sexual abuse Neg Hx   . Physical abuse Neg Hx     Social History:  Social History   Socioeconomic History  . Marital status: Married    Spouse name: Coralyn Mark  .  Number of children: 3  . Years of education: 50  . Highest education level: Not on file  Occupational History  . Occupation: retire    Comment: Animal nutritionist  . Financial resource strain: Not on file  . Food insecurity:    Worry: Not on file    Inability: Not on file  . Transportation needs:    Medical: Not on file    Non-medical: Not on file  Tobacco Use  . Smoking status: Current Every Day Smoker    Packs/day: 0.50    Years: 43.00    Pack years: 21.50    Types: Cigarettes  . Smokeless tobacco:  Never Used  Substance and Sexual Activity  . Alcohol use: No  . Drug use: No  . Sexual activity: Not Currently    Birth control/protection: Surgical  Lifestyle  . Physical activity:    Days per week: Not on file    Minutes per session: Not on file  . Stress: Not on file  Relationships  . Social connections:    Talks on phone: Not on file    Gets together: Not on file    Attends religious service: Not on file    Active member of club or organization: Not on file    Attends meetings of clubs or organizations: Not on file    Relationship status: Not on file  Other Topics Concern  . Not on file  Social History Narrative   Lives at home with Coralyn Mark   Retired [employer]    Allergies: No Known Allergies  Metabolic Disorder Labs: No results found for: HGBA1C, MPG No results found for: PROLACTIN Lab Results  Component Value Date   CHOL 225 (H) 05/21/2018   TRIG 85 05/21/2018   HDL 53 05/21/2018   CHOLHDL 4.2 05/21/2018   VLDL 28 01/10/2017   LDLCALC 153 (H) 05/21/2018   LDLCALC 164 (H) 01/10/2017     Therapeutic Level Labs: No results found for: LITHIUM No results found for: VALPROATE No components found for:  CBMZ  Current Medications: Current Outpatient Medications  Medication Sig Dispense Refill  . atorvastatin (LIPITOR) 20 MG tablet Take 1 tablet (20 mg total) by mouth daily. 90 tablet 1  . b complex vitamins tablet Take 1 tablet by mouth daily.     . cyclobenzaprine (FLEXERIL) 5 MG tablet Take 5 mg by mouth 3 (three) times daily as needed for muscle spasms.    . divalproex (DEPAKOTE ER) 500 MG 24 hr tablet Take 1 tablet (500 mg total) by mouth daily. 90 tablet 3  . escitalopram (LEXAPRO) 20 MG tablet Take 1 tablet (20 mg total) by mouth 2 (two) times daily. 90 tablet 3  . ketoconazole (NIZORAL) 2 % cream Apply 1 application topically daily. prn    . KRILL OIL PO Take 350 mg by mouth daily.    . lansoprazole (PREVACID) 15 MG capsule Take 15 mg by mouth as needed.     Marland Kitchen LORazepam (ATIVAN) 1 MG tablet Take 1 tablet (1 mg total) by mouth 3 (three) times daily as needed for anxiety. 90 tablet 3  . losartan (COZAAR) 50 MG tablet Take 1 tablet (50 mg total) by mouth daily. 90 tablet 3  . meloxicam (MOBIC) 15 MG tablet Take 1 tablet (15 mg total) by mouth daily. 30 tablet 2  . Multiple Vitamin (MULTIVITAMIN) tablet Take 1 tablet by mouth daily.    . valACYclovir (VALTREX) 1000 MG tablet Take 1 tablet (1,000 mg total) by mouth daily as needed. 5 tablet 11   No current facility-administered medications for this visit.      Musculoskeletal: Strength & Muscle Tone: within normal limits Gait & Station: normal Patient leans: N/A  Psychiatric Specialty Exam: Review of Systems  Musculoskeletal: Positive for back pain and joint pain.  All other systems reviewed and are negative.   Blood pressure (!) 144/78, pulse 94, height 5\' 5"  (1.651 m), weight 156 lb (70.8 kg), SpO2 96 %.Body mass index is 25.96 kg/m.  General Appearance: Casual, Neat and Well Groomed  Eye Contact:  Good  Speech:  Clear and Coherent  Volume:  Normal  Mood:  Euthymic  Affect:  Appropriate and Congruent  Thought Process:  Goal Directed  Orientation:  Full (Time, Place, and Person)  Thought Content: WDL   Suicidal Thoughts:  No  Homicidal Thoughts:  No  Memory:  Immediate;   Good Recent;   Good Remote;   Good  Judgement:  Good  Insight:  Good  Psychomotor  Activity:  Normal  Concentration:  Concentration: Good and Attention Span: Good  Recall:  Good  Fund of Knowledge: Good  Language: Good  Akathisia:  No  Handed:  Right  AIMS (if indicated): not done  Assets:  Communication Skills Desire for Improvement Resilience Social Support Talents/Skills  ADL's:  Intact  Cognition: WNL  Sleep:  Good   Screenings: PHQ2-9     Office Visit from 05/14/2018 in [facility] Office Visit from 01/15/2018 in [facility] from 01/08/2017 in [facility] Office Visit from 11/07/2016 in [facility]  PHQ-2 Total Score  1  1  0  0  PHQ-9 Total Score  4  -  -  -       Assessment and Plan: Patient is a 67 year old female with a history of depression anxiety and a remote history of alcohol abuse.  She is doing well on her current regimen.  She will continue Ativan 1 mg 3 times daily as needed for anxiety, Depakote ER 500 mg daily for mood stabilization and Lexapro 20 mg twice daily for depression.  She will return to see me in 4 months   [name], MD 06/11/2018, 9:45 AM

## 2018-06-13 ENCOUNTER — Ambulatory Visit (HOSPITAL_COMMUNITY): Payer: Self-pay | Admitting: Psychiatry

## 2018-06-13 MED ORDER — ROSUVASTATIN CALCIUM 20 MG PO TABS: 20.0000 mg | ORAL_TABLET | Freq: Every day | ORAL | 1 refills | Status: DC

## 2018-07-04 ENCOUNTER — Ambulatory Visit (INDEPENDENT_AMBULATORY_CARE_PROVIDER_SITE_OTHER): Payer: Medicare Other | Admitting: Orthopaedic Surgery

## 2018-07-04 ENCOUNTER — Encounter (INDEPENDENT_AMBULATORY_CARE_PROVIDER_SITE_OTHER): Payer: Self-pay | Admitting: Orthopaedic Surgery

## 2018-07-04 VITALS — BP 154/90 | HR 77 | Ht 65.0 in | Wt 157.0 lb

## 2018-07-04 DIAGNOSIS — M48061 Spinal stenosis, lumbar region without neurogenic claudication: Secondary | ICD-10-CM

## 2018-07-04 DIAGNOSIS — M9983 Other biomechanical lesions of lumbar region: Secondary | ICD-10-CM | POA: Diagnosis not present

## 2018-07-04 NOTE — Progress Notes (Signed)
Office Visit Note   Patient: Abigail Wagner           Date of Birth: 1950-12-16           MRN: [id number] Visit Date: 07/04/2018              Requested by: Susy Frizzle, MD [address] [place], [place] [address] PCP: Susy Frizzle, MD   Assessment & Plan: Visit Diagnoses:  1. Neural foraminal stenosis of lumbar spine     Plan: Patient is done well with the left L4-5 epidural foraminal injection.  She requests proceeding with the injection on the right side for residual symptoms I can follow her up again on a as needed basis.  We discussed the findings on the previous MRI from August.  She is happy with the results of far with her left injection.  Follow-Up Instructions: No follow-ups on file.   Orders:  No orders of the defined types were placed in this encounter.  No orders of the defined types were placed in this encounter.     Procedures: No procedures performed   Clinical Data: No additional findings.   Subjective: Chief Complaint  Patient presents with  . Lower Back - Follow-up    Post Lumbar ESI    HPI 67 year old female returns post lumbar injection on the left side on 06/03/2018 by Dr. Ernestina Patches for left foraminal stenosis with a foraminal injection.  States the left side is doing well but she continues to have problems on the right and is requesting a repeat visit with Dr. Ernestina Patches for right injection.  She had some foraminal narrowing on the right at L3-4 and also some facet degenerative changes at that level.  She is been moving better but still has a persistent pain on the right.  It bothers her more with turning and twisting.  Does not have any radicular symptoms down her right leg.  She denies bowel or bladder associated symptoms.  MRI date was 05/08/2018.  No fever chills she tolerated the epidural well last month.  Review of Systems 14 point review of systems updated unchanged from 822/2019 office visit.   Objective: Vital Signs: BP (!) 154/90  (BP Location: Left Wrist)   Pulse 77   Ht 5\' 5"  (1.914 m)   Wt 157 lb (71.2 kg)   BMI 26.13 kg/m   Physical Exam  Constitutional: She is oriented to person, place, and time. She appears well-developed.  HENT:  Head: Normocephalic.  Right Ear: External ear normal.  Left Ear: External ear normal.  Eyes: Pupils are equal, round, and reactive to light.  Neck: No tracheal deviation present. No thyromegaly present.  Cardiovascular: Normal rate.  Pulmonary/Chest: Effort normal.  Abdominal: Soft.  Neurological: She is alert and oriented to person, place, and time.  Skin: Skin is warm and dry.  Psychiatric: She has a normal mood and affect. Her behavior is normal.    Ortho Exam patient has normal heel toe gait.  No pain with lumbar extension for flexion fingertips almost to toes.  She has some mild sciatic tenderness on the right negative on the left.  Normal hip range of motion she can cross her legs he is easily.  No rash over exposed skin. Specialty Comments:  No specialty comments available.  Imaging: No results found.   PMFS History: Patient Active Problem List   Diagnosis Date Noted  . Internal carotid artery stenosis, left   . Neural foraminal stenosis of  lumbar spine 05/16/2018  . Genetic testing 05/24/2017  . HLD (hyperlipidemia) 02/07/2017  . Normocytic normochromic anemia 01/10/2017  . GERD (gastroesophageal reflux disease) 09/27/2016  . Back pain 09/27/2016  . Anxiety 09/27/2016  . Allergy 09/27/2016  . Radiation proctitis 12/23/2015  . Anal cancer ([place]) 11/19/2015  . History of ovarian cancer 08/03/2015  . Prolapse of vaginal vault after hysterectomy 07/20/2015  . Migraines 05/20/2015  . Port-A-Cath in place 01/13/2015  . Epithelial ovarian cancer, FIGO stage IIIA ([place]) 07/16/2014  . Abdominal carcinomatosis ([place]) 06/18/2014  . Bipolar 1 disorder ([place]) 12/27/2011  . Kidney stones 12/27/2011  . IBS (irritable colon syndrome) 12/27/2011  . Depression 11/07/2011    Past Medical History:  Diagnosis Date  . Allergy   . Anal carcinoma [facility]) oncologist-  dr Whitney Muse ([facility])   dx 12/ 2016 [facility] --  chemo and radiation- Dr. Marcello Moores  . Anemia    due to her cancer  . Anxiety    Dr. Harrington Challenger at [place] (psychiatrist).    . Carcinoma of ovary, stage 3 [facility]) oncologist-  dr gehrig/  dr Doreene Eland (cancer center in [place]/ [facility])--  no recurrency   dx 09/ 2015  Stage IIIB  papillary ovarian carcinoma  s/p  omentectomy and BSO &  chemotherapy (completed 12-07-2014)  . Chemotherapy induced nausea and vomiting 10/23/2014  . Chronic kidney disease    stones  . DDD (degenerative disc disease), lumbosacral   . GERD (gastroesophageal reflux disease)   . Headache(784.0)   . History of acute respiratory failure    05-30-2009  drug overdose-- (intubated for 2 days)  and aspiration pneumonia  . History of adenomatous polyp of colon 10/07/2015   tubular adenoma high grade dysplasia  . History of herpes genitalis   . History of kidney stones   . History of ovarian cancer 08/03/2015  . History of suicide attempt    per documentation in epic  05-30-2009  overdose benaodiazepine  . HTN (hypertension)    takes Metoprolol daily  . Hyperlipidemia   . Hypertension 09/27/2016  . IBS (irritable bowel syndrome)    takes Bentyl daily  . Internal carotid artery stenosis, left    <50%, ulcerative plaque at carotid bulb.   . Major depression   . Mood disorder ([place])   . OA (osteoarthritis)    both hips  . Ovarian cancer ([place])    2015-TAH/BSO  . Prolapse of vaginal vault after hysterectomy 07/20/2015  . Rectovaginal fistula 08/05/2015  . Sigmoid diverticulosis   . Smokers' cough ([place])     Family History  Problem Relation Age of Onset  . Bipolar disorder Mother   . Anxiety disorder Mother   . Dementia Mother   . Depression Mother   . Mental illness Mother   . Vision loss Mother   . Alzheimer's disease Mother   . Alcohol abuse Paternal Uncle   . Bipolar  disorder Maternal Grandmother   . Dementia Maternal Grandmother   . Alzheimer's disease Maternal Grandmother   . Alcohol abuse Paternal Uncle   . Stroke Brother   . Deep vein thrombosis Son   . Heart disease Father   . Hyperlipidemia Father   . Hypertension Father   . Stroke Father   . Vision loss Father   . Atrial fibrillation Sister   . Heart disease Brother   . Heart disease Brother   . ADD / ADHD Neg Hx   . Drug abuse Neg Hx   . OCD Neg Hx   .  Paranoid behavior Neg Hx   . Schizophrenia Neg Hx   . Seizures Neg Hx   . Sexual abuse Neg Hx   . Physical abuse Neg Hx     Past Surgical History:  Procedure Laterality Date  . BREAST ENHANCEMENT SURGERY  1986  . [place]  . COLONOSCOPY N/A 10/07/2015   Procedure: COLONOSCOPY;  Surgeon: Rogene Houston, MD;  [facility];  Service: Endoscopy;  Laterality: N/A;  7:30  . EXPLORATORY LAPAROTOMY/ OMENTECTOMY/  BILATERAL SALPINGOOPHORECTOMY/  [facility] PLACEMENT  07-03-2014   [facility]  . KNEE ARTHROSCOPY Left 09-22-2004  . LAPAROSCOPY N/A 06/02/2014   Procedure: DIAGNOSTIC LAPAROSCOPY, OMENTAL BIOPSY, RIGHT OVARY BIOPSY, LYSIS OF ADHESIONS;  Surgeon: Fanny Skates, MD;  Location: [place];  Service: General;  Laterality: N/A;  . MUCOSAL ADVANCEMENT FLAP N/A 06/15/2016   Procedure: EXCISION RECTOVAGINAOL FISTULA WITH MUCOSAL ADVANCEMENT FLAP;  Surgeon: Leighton Ruff, MD;  Location: [place];  Service: General;  Laterality: N/A;  . PLACEMENT OF SETON N/A 09/09/2015   Procedure: PLACEMENT OF SETON;  Surgeon: Leighton Ruff, MD;  Location: [place];  Service: General;  Laterality: N/A;  . PORT-A-CATH REMOVAL Right 08/17/2014   Procedure: REMOVAL INTRAPERITONEAL CHEMO PORT;  Surgeon: Fanny Skates, MD;  Location: [place];  Service: General;  Laterality: Right;  . PORTACATH PLACEMENT Right 08/17/2014   Procedure:  PLACE NEW PORT A CATH;  Surgeon: Fanny Skates, MD;   Location: [place];  Service: General;  Laterality: Right;  . RECTAL BIOPSY N/A 09/09/2015   Procedure: BIOPSY OF RECTOVAGINAL MASS;  Surgeon: Leighton Ruff, MD;  Location: [place];  Service: General;  Laterality: N/A;  . TUBAL LIGATION  YRS AGO  . VAGINAL HYSTERECTOMY  1981   fibroids   Social History   Occupational History  . Occupation: retire    Comment: [employer]  Tobacco Use  . Smoking status: Current Every Day Smoker    Packs/day: 0.50    Years: 43.00    Pack years: 21.50    Types: Cigarettes  . Smokeless tobacco: Never Used  Substance and Sexual Activity  . Alcohol use: No  . Drug use: No  . Sexual activity: Not Currently    Birth control/protection: Surgical

## 2018-07-12 ENCOUNTER — Other Ambulatory Visit: Payer: Self-pay | Admitting: Family Medicine

## 2018-07-12 DIAGNOSIS — Z1231 Encounter for screening mammogram for malignant neoplasm of breast: Secondary | ICD-10-CM

## 2018-07-22 ENCOUNTER — Ambulatory Visit (INDEPENDENT_AMBULATORY_CARE_PROVIDER_SITE_OTHER): Payer: Self-pay

## 2018-07-22 ENCOUNTER — Ambulatory Visit (INDEPENDENT_AMBULATORY_CARE_PROVIDER_SITE_OTHER): Payer: Medicare Other | Admitting: Physical Medicine and Rehabilitation

## 2018-07-22 ENCOUNTER — Encounter (INDEPENDENT_AMBULATORY_CARE_PROVIDER_SITE_OTHER): Payer: Self-pay | Admitting: Physical Medicine and Rehabilitation

## 2018-07-22 VITALS — BP 148/88 | HR 79 | Temp 97.6°F

## 2018-07-22 DIAGNOSIS — M5416 Radiculopathy, lumbar region: Secondary | ICD-10-CM | POA: Diagnosis not present

## 2018-07-22 MED ORDER — BETAMETHASONE SOD PHOS & ACET 6 (3-3) MG/ML IJ SUSP
12.0000 mg | Freq: Once | INTRAMUSCULAR | Status: AC
  Administered 2018-07-22: 12 mg

## 2018-07-22 NOTE — Progress Notes (Signed)
 .  Numeric Pain Rating Scale and Functional Assessment Average Pain 6   In the last MONTH (on 0-10 scale) has pain interfered with the following?  1. General activity like being  able to carry out your everyday physical activities such as walking, climbing stairs, carrying groceries, or moving a chair?  Rating(6)   +Driver, -BT, -Dye Allergies.  

## 2018-07-22 NOTE — Patient Instructions (Signed)

## 2018-07-24 ENCOUNTER — Ambulatory Visit (HOSPITAL_COMMUNITY)
Admission: RE | Admit: 2018-07-24 | Discharge: 2018-07-24 | Disposition: A | Discharge status: Home / Self Care | Payer: Medicare Other | Source: Ambulatory Visit | Attending: Family Medicine | Admitting: Family Medicine

## 2018-07-24 ENCOUNTER — Ambulatory Visit (HOSPITAL_COMMUNITY): Payer: Self-pay

## 2018-07-24 DIAGNOSIS — Z1231 Encounter for screening mammogram for malignant neoplasm of breast: Secondary | ICD-10-CM

## 2018-08-05 NOTE — Procedures (Signed)
Lumbosacral Transforaminal Epidural Steroid Injection - Sub-Pedicular Approach with Fluoroscopic Guidance  Patient: Abigail Wagner      Date of Birth: 03/27/1951 MRN: [id number] PCP: Susy Frizzle, MD      Visit Date: 07/22/2018   Universal Protocol:    Date/Time: 07/22/2018  Consent Given By: the patient  Position: PRONE  Additional Comments: Vital signs were monitored before and after the procedure. Patient was prepped and draped in the usual sterile fashion. The correct patient, procedure, and site was verified.   Injection Procedure Details:  Procedure Site One Meds Administered:  Meds ordered this encounter  Medications  . betamethasone acetate-betamethasone sodium phosphate (CELESTONE) injection 12 mg    Laterality: Right  Location/Site:  L4-L5  Needle size: 22 G  Needle type: Spinal  Needle Placement: Transforaminal  Findings:    -Comments: Excellent flow of contrast along the nerve and into the epidural space.  Procedure Details: After squaring off the end-plates to get a true AP view, the C-arm was positioned so that an oblique view of the foramen as noted above was visualized. The target area is just inferior to the "nose of the scotty dog" or sub pedicular. The soft tissues overlying this structure were infiltrated with 2-3 ml. of 1% Lidocaine without Epinephrine.  The spinal needle was inserted toward the target using a "trajectory" view along the fluoroscope beam.  Under AP and lateral visualization, the needle was advanced so it did not puncture dura and was located close the 6 O'Clock position of the pedical in AP tracterory. Biplanar projections were used to confirm position. Aspiration was confirmed to be negative for CSF and/or blood. A 1-2 ml. volume of Isovue-250 was injected and flow of contrast was noted at each level. Radiographs were obtained for documentation purposes.   After attaining the desired flow of contrast documented above, a 0.5 to  1.0 ml test dose of 0.25% Marcaine was injected into each respective transforaminal space.  The patient was observed for 90 seconds post injection.  After no sensory deficits were reported, and normal lower extremity motor function was noted,   the above injectate was administered so that equal amounts of the injectate were placed at each foramen (level) into the transforaminal epidural space.   Additional Comments:  The patient tolerated the procedure well Dressing: Band-Aid    Post-procedure details: Patient was observed during the procedure. Post-procedure instructions were reviewed.  Patient left the clinic in stable condition.

## 2018-08-05 NOTE — Progress Notes (Signed)
Abigail Wagner - 67 y.o. female MRN [id number]  Date of birth: November 26, 1950  Office Visit Note: Visit Date: 07/22/2018 PCP: Susy Frizzle, MD Referred by: Susy Frizzle, MD  Subjective: Chief Complaint  Patient presents with  . Lower Back - Pain   HPI:  Abigail Wagner is a 67 y.o. female who comes in today At the request of Dr. Rodell Perna for right L4 transforaminal epidural steroid injection which will be diagnostic and hopefully therapeutic for chronic worsening right hip and leg pain.  MRI reviewed.  ROS Otherwise per HPI.  Assessment & Plan: Visit Diagnoses:  1. Lumbar radiculopathy     Plan: No additional findings.   Meds & Orders:  Meds ordered this encounter  Medications  . betamethasone acetate-betamethasone sodium phosphate (CELESTONE) injection 12 mg    Orders Placed This Encounter  Procedures  . XR C-ARM NO REPORT  . Epidural Steroid injection    Follow-up: Return if symptoms worsen or fail to improve.   Procedures: No procedures performed  Lumbosacral Transforaminal Epidural Steroid Injection - Sub-Pedicular Approach with Fluoroscopic Guidance  Patient: Abigail Wagner      Date of Birth: 11-30-1950 MRN: [id number] PCP: Susy Frizzle, MD      Visit Date: 07/22/2018   Universal Protocol:    Date/Time: 07/22/2018  Consent Given By: the patient  Position: PRONE  Additional Comments: Vital signs were monitored before and after the procedure. Patient was prepped and draped in the usual sterile fashion. The correct patient, procedure, and site was verified.   Injection Procedure Details:  Procedure Site One Meds Administered:  Meds ordered this encounter  Medications  . betamethasone acetate-betamethasone sodium phosphate (CELESTONE) injection 12 mg    Laterality: Right  Location/Site:  L4-L5  Needle size: 22 G  Needle type: Spinal  Needle Placement: Transforaminal  Findings:    -Comments: Excellent flow of contrast along the nerve  and into the epidural space.  Procedure Details: After squaring off the end-plates to get a true AP view, the C-arm was positioned so that an oblique view of the foramen as noted above was visualized. The target area is just inferior to the "nose of the scotty dog" or sub pedicular. The soft tissues overlying this structure were infiltrated with 2-3 ml. of 1% Lidocaine without Epinephrine.  The spinal needle was inserted toward the target using a "trajectory" view along the fluoroscope beam.  Under AP and lateral visualization, the needle was advanced so it did not puncture dura and was located close the 6 O'Clock position of the pedical in AP tracterory. Biplanar projections were used to confirm position. Aspiration was confirmed to be negative for CSF and/or blood. A 1-2 ml. volume of Isovue-250 was injected and flow of contrast was noted at each level. Radiographs were obtained for documentation purposes.   After attaining the desired flow of contrast documented above, a 0.5 to 1.0 ml test dose of 0.25% Marcaine was injected into each respective transforaminal space.  The patient was observed for 90 seconds post injection.  After no sensory deficits were reported, and normal lower extremity motor function was noted,   the above injectate was administered so that equal amounts of the injectate were placed at each foramen (level) into the transforaminal epidural space.   Additional Comments:  The patient tolerated the procedure well Dressing: Band-Aid    Post-procedure details: Patient was observed during the procedure. Post-procedure instructions were reviewed.  Patient left the clinic in stable condition.  Clinical History: MRI LUMBAR SPINE WITHOUT CONTRAST  TECHNIQUE: Multiplanar, multisequence MR imaging of the lumbar spine was performed. No intravenous contrast was administered.  COMPARISON:  CT chest, abdomen and pelvis 09/20/2016.  FINDINGS: Segmentation:   Standard.  Alignment: Convex left scoliosis with the apex at L2-3 is identified. There is trace retrolisthesis L2 on L3 and anterolisthesis L5 on S1.  Vertebrae: No fracture or worrisome lesion. Degenerative endplate signal change at L2-3 is eccentric to the right.  Conus medullaris and cauda equina: Conus extends to the T12-L1 level. Conus and cauda equina appear normal.  Paraspinal and other soft tissues: Negative.  Disc levels:  T11-12 and T12-L1 are imaged in the sagittal plane only and negative.  L1-2: Shallow disc bulge without stenosis.  L2-3: There is loss of disc space height and a bulge eccentric to the right. Moderate to moderately severe facet degenerative disease is worse on the right. There is mild narrowing in the right subarticular recess and foramen. The left foramen is open.  L3-4: Moderately severe bilateral facet degenerative change is present. The disc is uncovered with a shallow bulge. Superimposed right lateral recess protrusion with slight cephalad extension is identified. The protrusion causes narrowing in the right lateral recess which could impact the descending right L3 root. Disc and facet degenerative change also cause moderate to moderately severe right foraminal narrowing. The left foramen is open.  L4-5: There is a shallow disc bulge which is eccentric to the left, mild facet degenerative disease and ligamentum flavum thickening. There is minimal narrowing in the left lateral recess and moderate to moderately severe left foraminal narrowing. The right foramen is open.  L5-S1: Bilateral facet degenerative disease is advanced on the left. There is a shallow disc bulge to the left but the central spinal canal and neural foramina are open.  IMPRESSION: Mild narrowing in the right subarticular recess and foramen at L2-3 due to facet arthropathy and bulging disc to the right.  Disc bulge and a superimposed right lateral recess  protrusion with cephalad extension at L3-4 result in mild encroachment on the descending right L4 root. Moderate to moderately severe right foraminal narrowing is also present at this level.  Bulging disc and facet degenerative change cause minimal narrowing in the left lateral recess at L4-5 and moderate to moderately severe left foraminal narrowing.   Electronically Signed   By: Inge Rise M.D.   On: 05/08/2018 13:45     Objective:  VS:  HT:    WT:   BMI:     BP:(!) 148/88  HR:79bpm  TEMP:97.6 F (36.4 C)(Oral)  RESP:96 % Physical Exam  Ortho Exam Imaging: No results found.

## 2018-08-19 ENCOUNTER — Encounter (INDEPENDENT_AMBULATORY_CARE_PROVIDER_SITE_OTHER): Payer: Self-pay | Admitting: Orthopaedic Surgery

## 2018-08-30 ENCOUNTER — Inpatient Hospital Stay (HOSPITAL_COMMUNITY): Payer: Medicare Other | Attending: Internal Medicine

## 2018-08-30 ENCOUNTER — Ambulatory Visit (HOSPITAL_COMMUNITY): Payer: Self-pay | Admitting: Hematology

## 2018-08-30 ENCOUNTER — Other Ambulatory Visit: Payer: Self-pay

## 2018-08-30 DIAGNOSIS — C569 Malignant neoplasm of unspecified ovary: Secondary | ICD-10-CM

## 2018-08-30 DIAGNOSIS — Z9221 Personal history of antineoplastic chemotherapy: Secondary | ICD-10-CM | POA: Insufficient documentation

## 2018-08-30 DIAGNOSIS — Z923 Personal history of irradiation: Secondary | ICD-10-CM | POA: Diagnosis not present

## 2018-08-30 DIAGNOSIS — Z85048 Personal history of other malignant neoplasm of rectum, rectosigmoid junction, and anus: Secondary | ICD-10-CM | POA: Insufficient documentation

## 2018-08-30 DIAGNOSIS — C210 Malignant neoplasm of anus, unspecified: Secondary | ICD-10-CM

## 2018-08-30 DIAGNOSIS — D649 Anemia, unspecified: Secondary | ICD-10-CM

## 2018-08-30 DIAGNOSIS — D509 Iron deficiency anemia, unspecified: Secondary | ICD-10-CM | POA: Insufficient documentation

## 2018-08-30 DIAGNOSIS — I10 Essential (primary) hypertension: Secondary | ICD-10-CM | POA: Insufficient documentation

## 2018-08-30 DIAGNOSIS — Z8543 Personal history of malignant neoplasm of ovary: Secondary | ICD-10-CM | POA: Diagnosis not present

## 2018-08-30 LAB — COMPREHENSIVE METABOLIC PANEL
ALT: 20 U/L (ref 0–44)
ANION GAP: 8 (ref 5–15)
AST: 35 U/L (ref 15–41)
Albumin: 3.8 g/dL (ref 3.5–5.0)
Alkaline Phosphatase: 58 U/L (ref 38–126)
BILIRUBIN TOTAL: 0.3 mg/dL (ref 0.3–1.2)
BUN: 16 mg/dL (ref 8–23)
CO2: 27 mmol/L (ref 22–32)
Calcium: 8.7 mg/dL — ABNORMAL LOW (ref 8.9–10.3)
Chloride: 104 mmol/L (ref 98–111)
Creatinine, Ser: 0.74 mg/dL (ref 0.44–1.00)
GFR calc Af Amer: >60 mL/min (ref >60)
Glucose, Bld: 97 mg/dL (ref 70–99)
POTASSIUM: 3.9 mmol/L (ref 3.5–5.1)
Sodium: 139 mmol/L (ref 135–145)
TOTAL PROTEIN: 6.6 g/dL (ref 6.5–8.1)

## 2018-08-30 LAB — CBC WITH DIFFERENTIAL/PLATELET
Abs Immature Granulocytes: 0.04 10*3/uL (ref 0.00–0.07)
Basophils Absolute: 0 10*3/uL (ref 0.0–0.1)
Basophils Relative: 1 %
EOS ABS: 0.1 10*3/uL (ref 0.0–0.5)
Eosinophils Relative: 1 %
HCT: 35.8 % — ABNORMAL LOW (ref 36.0–46.0)
Hemoglobin: 11.5 g/dL — ABNORMAL LOW (ref 12.0–15.0)
Immature Granulocytes: 1 %
Lymphocytes Relative: 24 %
Lymphs Abs: 1.1 10*3/uL (ref 0.7–4.0)
MCH: 31.3 pg (ref 26.0–34.0)
MCHC: 32.1 g/dL (ref 30.0–36.0)
MCV: 97.5 fL (ref 80.0–100.0)
MONO ABS: 0.4 10*3/uL (ref 0.1–1.0)
MONOS PCT: 9 %
NEUTROS ABS: 2.9 10*3/uL (ref 1.7–7.7)
NEUTROS PCT: 64 %
PLATELETS: 223 10*3/uL (ref 150–400)
RBC: 3.67 MIL/uL — ABNORMAL LOW (ref 3.87–5.11)
RDW: 15.2 % (ref 11.5–15.5)
WBC: 4.6 10*3/uL (ref 4.0–10.5)
nRBC: 0 % (ref 0.0–0.2)

## 2018-08-30 LAB — LACTATE DEHYDROGENASE: LDH: 171 U/L (ref 98–192)

## 2018-08-30 LAB — FERRITIN: FERRITIN: 27 ng/mL (ref 11–307)

## 2018-08-30 MED ORDER — SODIUM CHLORIDE 0.9% FLUSH
10.0000 mL | INTRAVENOUS | Status: DC | PRN
  Administered 2018-08-30: 10 mL
  Filled 2018-08-30: qty 10

## 2018-08-30 MED ORDER — HEPARIN SOD (PORK) LOCK FLUSH 100 UNIT/ML IV SOLN
500.0000 [IU] | Freq: Once | INTRAVENOUS | Status: AC
  Administered 2018-08-30: 500 [IU] via INTRAVENOUS

## 2018-08-30 NOTE — Progress Notes (Signed)
Pt presents ambulatory for port flush and lab draw. VSS. No complaints at this time. Pt denies any changes since last visit.    Discharged from clinic ambulatory. F/U with [facility] as scheduled.

## 2018-08-30 NOTE — Patient Instructions (Signed)
[place] at [facility]  Discharge Instructions:  Port flush and lab draw.  _______________________________________________________________  Thank you for choosing [place] at [facility] to provide your oncology and hematology care.  To afford each patient quality time with our providers, please arrive at least 15 minutes before your scheduled appointment.  You need to re-schedule your appointment if you arrive 10 or more minutes late.  We strive to give you quality time with our providers, and arriving late affects you and other patients whose appointments are after yours.  Also, if you no show three or more times for appointments you may be dismissed from the clinic.  Again, thank you for choosing [place] at [place] hope is that these requests will allow you access to exceptional care and in a timely manner. _______________________________________________________________  If you have questions after your visit, please contact our office at [phone] between the hours of 8:30 a.m. and 5:00 p.m. Voicemails left after 4:30 p.m. will not be returned until the following business day. _______________________________________________________________  For prescription refill requests, have your pharmacy contact our office. _______________________________________________________________  Recommendations made by the consultant and any test results will be sent to your referring physician. _______________________________________________________________

## 2018-08-31 LAB — CEA: CEA: 7 ng/mL — ABNORMAL HIGH (ref 0.0–4.7)

## 2018-08-31 LAB — CA 125: Cancer Antigen (CA) 125: 9.5 U/mL (ref 0.0–38.1)

## 2018-09-04 ENCOUNTER — Inpatient Hospital Stay (HOSPITAL_BASED_OUTPATIENT_CLINIC_OR_DEPARTMENT_OTHER): Payer: Medicare Other | Admitting: Internal Medicine

## 2018-09-04 ENCOUNTER — Encounter (HOSPITAL_COMMUNITY): Payer: Self-pay | Admitting: Internal Medicine

## 2018-09-04 VITALS — BP 163/67 | HR 89 | Temp 97.7°F | Resp 18 | Wt 167.5 lb

## 2018-09-04 DIAGNOSIS — D509 Iron deficiency anemia, unspecified: Secondary | ICD-10-CM

## 2018-09-04 DIAGNOSIS — I10 Essential (primary) hypertension: Secondary | ICD-10-CM | POA: Diagnosis not present

## 2018-09-04 DIAGNOSIS — Z85048 Personal history of other malignant neoplasm of rectum, rectosigmoid junction, and anus: Secondary | ICD-10-CM

## 2018-09-04 DIAGNOSIS — Z923 Personal history of irradiation: Secondary | ICD-10-CM

## 2018-09-04 DIAGNOSIS — Z9221 Personal history of antineoplastic chemotherapy: Secondary | ICD-10-CM

## 2018-09-04 DIAGNOSIS — C569 Malignant neoplasm of unspecified ovary: Secondary | ICD-10-CM

## 2018-09-04 DIAGNOSIS — Z8543 Personal history of malignant neoplasm of ovary: Secondary | ICD-10-CM

## 2018-09-04 DIAGNOSIS — C210 Malignant neoplasm of anus, unspecified: Secondary | ICD-10-CM

## 2018-09-04 NOTE — Progress Notes (Signed)
Diagnosis Anal cancer ([place]) - Plan: CBC with Differential/Platelet, Comprehensive metabolic panel, Lactate dehydrogenase, Ferritin, CEA, CA 125  Epithelial ovarian cancer, FIGO stage IIIA (HCC) - Plan: CBC with Differential/Platelet, Comprehensive metabolic panel, Lactate dehydrogenase, Ferritin, CEA, CA 125  Staging Cancer Staging Anal cancer (HCC) Staging form: Anus, AJCC 8th Edition - Clinical: Stage IIA (cT2, cN0, cM0) - Signed by Baird Cancer, PA-C on 09/27/2016  CURRENT THERAPY: Surveillance per NCCN Guidelines for both malignancies AND Ferrex forte po daily    Assessment and Plan:1. Stage II anal cancer: 67 yr old female diagnosed in 08/2015. Treated with concurrent chemoradiation with 5-FU/Mitomycin C with curative intent. Completed treatment on 12/06/15. Last CT chest/abd/pelvis in 08/2016 negative for recurrent disease.     She denies any GI symptoms and has noted no blood in stool. She is here for follow-up. Labs done 08/30/2018 reviewed and showed WBC 4.6 HB 11.5 plts 223,000.  Chemistries WNL with K+ 3.9 Cr 0.74 and normal LFTs.  Ferritin 27.  CEA elevated at 7.  Review of records going back to 2017 when she was followed by Dr. Whitney Muse showed an elevated CEA of 6 in 03/2016.  Pt was previously followed by NP Renato Battles and pt indicated problems with diarrhea after imaging and was not recommended for repeat imaging.     I discussed with her today option of repeat imaging for follow-up but discussed with her tumor marker has been elevated going back to 2017.  Pt desires to defer imaging and will have repeat labs in 02/2019 with tumor markers for ongoing follow-up.  She is advised to notify the office if any change in symptoms prior to that time.    She should continue to follow-up with Dr. Marcello Moores with GI surgery for DRE and anoscopy as directed.  Also continue GI follow-up as recommended.    2.  Stage III ovarian cancer.  She was diagnosed in 2015 and treated with debulking surgery with  Dr. Alycia Rossetti, followed by [facility] chemotherapy x 6 cycles. Completed treatment on 12/07/14. CA-125 19.2 at time of diagnosis. Genetic testing in 04/2017 revealed no mutations.   CA 125 done 08/30/2018 WNL at 9.5.  She will have repeat labs in 02/2019.  Follow-up with GYN/Oncology as recommended.    3.  PAC.  She desires to keep her Port.  She should continue flushes as directed.    4.  IDA.  Labs done 08/30/2018 showed HB 11.5 with ferritin of 27  Pt on MVI and advised to add [place] vitamin.  Will repeat labs in 02/2019.    5.  HTN.  BP is 163/67.  Follow-up with PCP.    6.  Health maintenance.  Continue GI follow-up and mammograms as recommended.    25 minutes spent with more than 50% spent in counseling and coordination of care.    Current Status:  Pt is seen today for follow-up.  She is here to go over labs.  She denies any abdominal symptoms or rectal bleeding.      History of ovarian cancer   06/02/2014 Procedure    Diagnostic laparoscopy, lysis of adhesions, biopsy of omental nodules, biopsy right ovary by Dr Dalbert Batman    06/04/2014 Pathology Results    Diagnosis 1. Omentum, biopsy, Anterior peritoneal & omental nodule - SEROUS CARCINOMA WITH ASSOCIATED ABUNDANT PSAMMOMA BODIES AND DESMOPLASTIC STROMAL REACTION CONSISTENT WITH INVASIVE IMPLANTS. - SEE COMMENT. 2. Omentum, biopsy, Omental nodule - SEROUS CARCINOMA WITH ASSOCIATED ABUNDANT PSAMMOMA BODIES AND DESMOPLASTIC STROMAL REACTION CONSISTENT WITH INVASIVE  IMPLANTS. - SEE COMMENT. 3. Ovary, biopsy/wedge resection, Right ovary - SMALL FRAGMENTS OF ATYPICAL PAPILLARY PROLIFERATION WITH PSAMMOMA BODIES CONSISTENT WITH AT LEAST SEROUS BORDERLINE TUMOR.    07/03/2014 Procedure    Exploratory laparotomy, omentectomy, Bilateral salpingo-oophorectomy, inptraperitoneal PORT placement by Dr. Alycia Rossetti        07/24/2014 - 12/07/2014 Chemotherapy    Carboplatin/Paclitaxel x 6 cycles     08/17/2014 Procedure    Insertion of 8 French  power port clear view tunneled venous vascular access device next line use of fluoroscopy for guidance and positioning Use of ultrasound for venipuncture Removal of intraperitoneal chemotherapy port By Dr. Dalbert Batman    05/14/2017 Genetic Testing    Negative genetic testing on the [facility] panel.  The [facility] gene panel offered by [employer] includes sequencing and deletion/duplication testing of the following 28 genes: APC, ATM, BARD1, BMPR1A, BRCA1, BRCA2, BRIP1, CHD1, CDK4, CDKN2A, CHEK2, EPCAM (large rearrangement only), MLH1, MSH2, MSH6, MUTYH, NBN, PALB2, PMS2, PTEN, RAD51C, RAD51D, SMAD4, STK11, and TP53. Sequencing was performed for select regions of POLE and POLD1, and large rearrangement analysis was performed for select regions of GREM1. The report date is April 27, 2017.  HRD testing was performed on the ovarian tumor.  The tumor was negative for any deleterious BRCA1 or BRCA2 mutations.    Genomic instability status was not interpretable.  Therefore, [facility] was unable to analyze the ovarian tumor for genomic instability.  The report date is May 14, 2017.      Anal cancer ([place])   09/09/2015 Procedure    Rectovaginal septal mass biopsy by Dr. Marcello Moores    09/10/2015 Pathology Results    Soft tissue mass, biopsy, rectovaginal septal mass - SQUAMOUS CELL CARCINOMA.    10/15/2015 PET scan    1. Focal hypermetabolism with associated ill-defined soft tissue fullness at the junction of the anterior anal wall and posterior inferior vaginal wall, in keeping with the provided history of a primary anal carcinoma. 2. No hypermetabolic locoregional or distant metastatic disease. 3. Status post hysterectomy, with no abnormal findings at the vaginal cuff. No evidence of hypermetabolic peritoneal tumor. 4. Tiny nonobstructing bilateral renal stones.    10/25/2015 - 12/06/2015 Radiation Therapy    [place]    10/25/2015 - 12/06/2015 Chemotherapy    Mitomycin C and 5FU, by  Dr. Jacquiline Doe     06/15/2016 Procedure    EXCISION RECTOVAGINAOL FISTULA WITH MUCOSAL ADVANCEMENT FLAP by Dr. Marcello Moores    06/18/2016 Pathology Results    Fistula, rectovaginal SQUAMOUS, COLUMNAR JUNCTION MUCOSA AND SUBCUTANOUS SOFT TISSUE WITH FISTULA NO EVIDENCE OF MALIGNANCY    09/20/2016 Imaging    CT CAP- 1. No acute findings and no evidence for recurrent tumor or metastatic disease. 2. Aortic atherosclerosis and coronary artery calcification      Problem List Patient Active Problem List   Diagnosis Date Noted  . Internal carotid artery stenosis, left [I65.22]   . Neural foraminal stenosis of lumbar spine [M99.83] 05/16/2018  . Genetic testing [Z13.79] 05/24/2017  . HLD (hyperlipidemia) [E78.5] 02/07/2017  . Normocytic normochromic anemia [D64.9] 01/10/2017  . GERD (gastroesophageal reflux disease) [K21.9] 09/27/2016  . Back pain [M54.9] 09/27/2016  . Anxiety [F41.9] 09/27/2016  . Allergy [T78.40XA] 09/27/2016  . Radiation proctitis [K62.7] 12/23/2015  . Anal cancer ([place]) [C21.0] 11/19/2015  . History of ovarian cancer [Z85.43] 08/03/2015  . Prolapse of vaginal vault after hysterectomy [N99.3] 07/20/2015  . Migraines [G43.909] 05/20/2015  . Port-A-Cath in place [facility] 01/13/2015  . Epithelial ovarian  cancer, FIGO stage IIIA ([place]) [C56.9] 07/16/2014  . Abdominal carcinomatosis ([place]) [C76.2] 06/18/2014  . Bipolar 1 disorder ([place]) [F31.9] 12/27/2011  . Kidney stones [N20.0] 12/27/2011  . IBS (irritable colon syndrome) [K58.9] 12/27/2011  . Depression [F32.9] 11/07/2011    Past Medical History Past Medical History:  Diagnosis Date  . Allergy   . Anal carcinoma [facility]) oncologist-  dr Whitney Muse ([facility])   dx 12/ 2016 [facility] --  chemo and radiation- Dr. Marcello Moores  . Anemia    due to her cancer  . Anxiety    Dr. Harrington Challenger at [facility] (psychiatrist).    . Carcinoma of ovary, stage 3 [facility]) oncologist-  dr gehrig/  dr Doreene Eland (cancer center in [place])--  no  recurrency   dx 09/ 2015  Stage IIIB  papillary ovarian carcinoma  s/p  omentectomy and BSO &  chemotherapy (completed 12-07-2014)  . Chemotherapy induced nausea and vomiting 10/23/2014  . Chronic kidney disease    stones  . DDD (degenerative disc disease), lumbosacral   . GERD (gastroesophageal reflux disease)   . Headache(784.0)   . History of acute respiratory failure    05-30-2009  drug overdose-- (intubated for 2 days)  and aspiration pneumonia  . History of adenomatous polyp of colon 10/07/2015   tubular adenoma high grade dysplasia  . History of herpes genitalis   . History of kidney stones   . History of ovarian cancer 08/03/2015  . History of suicide attempt    per documentation in epic  05-30-2009  overdose benaodiazepine  . HTN (hypertension)    takes Metoprolol daily  . Hyperlipidemia   . Hypertension 09/27/2016  . IBS (irritable bowel syndrome)    takes Bentyl daily  . Internal carotid artery stenosis, left    <50%, ulcerative plaque at carotid bulb.   . Major depression   . Mood disorder ([place])   . OA (osteoarthritis)    both hips  . Ovarian cancer ([place])    2015-TAH/BSO  . Prolapse of vaginal vault after hysterectomy 07/20/2015  . Rectovaginal fistula 08/05/2015  . Sigmoid diverticulosis   . Smokers' cough [employer])     Past Surgical History Past Surgical History:  Procedure Laterality Date  . BREAST ENHANCEMENT SURGERY  1986  . [place]  . COLONOSCOPY N/A 10/07/2015   Procedure: COLONOSCOPY;  Surgeon: Rogene Houston, MD;  [facility];  Service: Endoscopy;  Laterality: N/A;  7:30  . EXPLORATORY LAPAROTOMY/ OMENTECTOMY/  BILATERAL SALPINGOOPHORECTOMY/  [facility] PLACEMENT  07-03-2014   [facility]  . KNEE ARTHROSCOPY Left 09-22-2004  . LAPAROSCOPY N/A 06/02/2014   Procedure: DIAGNOSTIC LAPAROSCOPY, OMENTAL BIOPSY, RIGHT OVARY BIOPSY, LYSIS OF ADHESIONS;  Surgeon: Fanny Skates, MD;  Location: [place];  Service: General;  Laterality: N/A;  .  MUCOSAL ADVANCEMENT FLAP N/A 06/15/2016   Procedure: EXCISION RECTOVAGINAOL FISTULA WITH MUCOSAL ADVANCEMENT FLAP;  Surgeon: Leighton Ruff, MD;  Location: [place];  Service: General;  Laterality: N/A;  . PLACEMENT OF SETON N/A 09/09/2015   Procedure: PLACEMENT OF SETON;  Surgeon: Leighton Ruff, MD;  Location: [place];  Service: General;  Laterality: N/A;  . PORT-A-CATH REMOVAL Right 08/17/2014   Procedure: REMOVAL INTRAPERITONEAL CHEMO PORT;  Surgeon: Fanny Skates, MD;  Location: [place];  Service: General;  Laterality: Right;  . PORTACATH PLACEMENT Right 08/17/2014   Procedure:  PLACE NEW PORT A CATH;  Surgeon: Fanny Skates, MD;  Location: [place];  Service:  General;  Laterality: Right;  . RECTAL BIOPSY N/A 09/09/2015   Procedure: BIOPSY OF RECTOVAGINAL MASS;  Surgeon: Leighton Ruff, MD;  Location: [place];  Service: General;  Laterality: N/A;  . TUBAL LIGATION  YRS AGO  . VAGINAL HYSTERECTOMY  1981   fibroids    Family History Family History  Problem Relation Age of Onset  . Bipolar disorder Mother   . Anxiety disorder Mother   . Dementia Mother   . Depression Mother   . Mental illness Mother   . Vision loss Mother   . Alzheimer's disease Mother   . Alcohol abuse Paternal Uncle   . Bipolar disorder Maternal Grandmother   . Dementia Maternal Grandmother   . Alzheimer's disease Maternal Grandmother   . Alcohol abuse Paternal Uncle   . Stroke Brother   . Deep vein thrombosis Son   . Heart disease Father   . Hyperlipidemia Father   . Hypertension Father   . Stroke Father   . Vision loss Father   . Atrial fibrillation Sister   . Heart disease Brother   . Heart disease Brother   . ADD / ADHD Neg Hx   . Drug abuse Neg Hx   . OCD Neg Hx   . Paranoid behavior Neg Hx   . Schizophrenia Neg Hx   . Seizures Neg Hx   . Sexual abuse Neg Hx   . Physical abuse Neg Hx      Social  History  reports that she has been smoking cigarettes. She has a 21.50 pack-year smoking history. She has never used smokeless tobacco. She reports that she does not drink alcohol or use drugs.  Medications  Current Outpatient Medications:  .  atorvastatin (LIPITOR) 20 MG tablet, Take 1 tablet (20 mg total) by mouth daily. (Patient not taking: Reported on 09/04/2018), Disp: 90 tablet, Rfl: 1 .  b complex vitamins tablet, Take 1 tablet by mouth daily., Disp: , Rfl:  .  cyclobenzaprine (FLEXERIL) 5 MG tablet, Take 5 mg by mouth 3 (three) times daily as needed for muscle spasms., Disp: , Rfl:  .  divalproex (DEPAKOTE ER) 500 MG 24 hr tablet, Take 1 tablet (500 mg total) by mouth daily., Disp: 90 tablet, Rfl: 3 .  escitalopram (LEXAPRO) 20 MG tablet, Take 1 tablet (20 mg total) by mouth 2 (two) times daily., Disp: 90 tablet, Rfl: 3 .  ketoconazole (NIZORAL) 2 % cream, Apply 1 application topically daily. prn, Disp: , Rfl:  .  KRILL OIL PO, Take 350 mg by mouth daily., Disp: , Rfl:  .  lansoprazole (PREVACID) 15 MG capsule, Take 15 mg by mouth as needed. , Disp: , Rfl:  .  LORazepam (ATIVAN) 1 MG tablet, Take 1 tablet (1 mg total) by mouth 3 (three) times daily as needed for anxiety., Disp: 90 tablet, Rfl: 3 .  losartan (COZAAR) 50 MG tablet, Take 1 tablet (50 mg total) by mouth daily., Disp: 90 tablet, Rfl: 3 .  meloxicam (MOBIC) 15 MG tablet, Take 1 tablet (15 mg total) by mouth daily., Disp: 30 tablet, Rfl: 2 .  Multiple Vitamin (MULTIVITAMIN) tablet, Take 1 tablet by mouth daily., Disp: , Rfl:  .  rosuvastatin (CRESTOR) 20 MG tablet, Take 1 tablet (20 mg total) by mouth daily., Disp: 90 tablet, Rfl: 1 .  valACYclovir (VALTREX) 1000 MG tablet, Take 1 tablet (1,000 mg total) by mouth daily as needed., Disp: 5 tablet, Rfl: 11  Allergies Patient has no known allergies.  Review  of Systems Review of Systems - Oncology ROS negative   Physical Exam  Vitals Wt Readings from Last 3 Encounters:   09/04/18 167 lb 8 oz (76 kg)  07/04/18 157 lb (71.2 kg)  05/16/18 157 lb (71.2 kg)   Temp Readings from Last 3 Encounters:  09/04/18 97.7 F (36.5 C) (Oral)  08/30/18 97.8 F (36.6 C) (Oral)  07/22/18 97.6 F (36.4 C) (Oral)   BP Readings from Last 3 Encounters:  09/04/18 (!) 163/67  08/30/18 (!) 159/64  07/22/18 (!) 148/88   Pulse Readings from Last 3 Encounters:  09/04/18 89  08/30/18 89  07/22/18 79   Constitutional: Well-developed, well-nourished, and in no distress.   HENT: Head: Normocephalic and atraumatic.  Mouth/Throat: No oropharyngeal exudate. Mucosa moist. Eyes: Pupils are equal, round, and reactive to light. Conjunctivae are normal. No scleral icterus.  Neck: Normal range of motion. Neck supple. No JVD present.  Cardiovascular: Normal rate, regular rhythm and normal heart sounds.  Exam reveals no gallop and no friction rub.   No murmur heard. Pulmonary/Chest: Effort normal and breath sounds normal. No respiratory distress. No wheezes.No rales.  Abdominal: Soft. Bowel sounds are normal. No distension. There is no tenderness. There is no guarding.  Musculoskeletal: No edema or tenderness.  Lymphadenopathy: No cervical, axillary or supraclavicular adenopathy.  Neurological: Alert and oriented to person, place, and time. No cranial nerve deficit.  Skin: Skin is warm and dry. No rash noted. No erythema. No pallor.  Psychiatric: Affect and judgment normal.   Labs No visits with results within 3 Day(s) from this visit.  Latest known visit with results is:  Infusion on 08/30/2018  Component Date Value Ref Range Status  . Cancer Antigen (CA) 125 08/30/2018 9.5  0.0 - 38.1 U/mL Final   Comment: (NOTE) Roche [employer] Electrochemiluminescence Immunoassay (ECLIA) Values obtained with different assay methods or kits cannot be used interchangeably.  Results cannot be interpreted as absolute evidence of the presence or absence of malignant disease. Performed At: [facility] [place], [place] [id number] Rush Farmer MD [id number]   . CEA 08/30/2018 7.0* 0.0 - 4.7 ng/mL Final   Comment: (NOTE)                             Nonsmokers          <3.9                             Smokers             <5.6 [employer] Electrochemiluminescence Immunoassay (ECLIA) Values obtained with different assay methods or kits cannot be used interchangeably.  Results cannot be interpreted as absolute evidence of the presence or absence of malignant disease. Performed At: [facility] [place], [place] [id number] Rush Farmer MD [id number]   . Ferritin 08/30/2018 27  11 - 307 ng/mL Final   Performed at [facility], [address] [place], [place] [address]  . LDH 08/30/2018 171  98 - 192 U/L Final   Performed at [facility], [address] [place], [place] [address]  . Sodium 08/30/2018 139  135 - 145 mmol/L Final  . Potassium 08/30/2018 3.9  3.5 - 5.1 mmol/L Final  . Chloride 08/30/2018 104  98 - 111 mmol/L Final  . CO2 08/30/2018 27  22 - 32 mmol/L Final  . Glucose, Bld 08/30/2018 97  70 - 99 mg/dL Final  . BUN 08/30/2018 16  8 - 23 mg/dL Final  . Creatinine, Ser 08/30/2018 0.74  0.44 - 1.00 mg/dL Final  . Calcium 08/30/2018 8.7* 8.9 - 10.3 mg/dL Final  . Total Protein 08/30/2018 6.6  6.5 - 8.1 g/dL Final  . Albumin 08/30/2018 3.8  3.5 - 5.0 g/dL Final  . AST 08/30/2018 35  15 - 41 U/L Final  . ALT 08/30/2018 20  0 - 44 U/L Final  . Alkaline Phosphatase 08/30/2018 58  38 - 126 U/L Final  . Total Bilirubin 08/30/2018 0.3  0.3 - 1.2 mg/dL Final  . GFR calc non Af Amer 08/30/2018 >60  >60 mL/min Final  . GFR calc Af Amer 08/30/2018 >60  >60 mL/min Final  . Anion gap 08/30/2018 8  5 - 15 Final   Performed at [facility], [address], [place], [place] [address]  . WBC 08/30/2018 4.6  4.0 - 10.5 K/uL Final  . RBC 08/30/2018 3.67* 3.87 - 5.11 MIL/uL Final  . Hemoglobin 08/30/2018 11.5* 12.0 - 15.0  g/dL Final  . HCT 08/30/2018 35.8* 36.0 - 46.0 % Final  . MCV 08/30/2018 97.5  80.0 - 100.0 fL Final  . MCH 08/30/2018 31.3  26.0 - 34.0 pg Final  . MCHC 08/30/2018 32.1  30.0 - 36.0 g/dL Final  . RDW 08/30/2018 15.2  11.5 - 15.5 % Final  . Platelets 08/30/2018 223  150 - 400 K/uL Final  . nRBC 08/30/2018 0.0  0.0 - 0.2 % Final  . Neutrophils Relative % 08/30/2018 64  % Final  . Neutro Abs 08/30/2018 2.9  1.7 - 7.7 K/uL Final  . Lymphocytes Relative 08/30/2018 24  % Final  . Lymphs Abs 08/30/2018 1.1  0.7 - 4.0 K/uL Final  . Monocytes Relative 08/30/2018 9  % Final  . Monocytes Absolute 08/30/2018 0.4  0.1 - 1.0 K/uL Final  . Eosinophils Relative 08/30/2018 1  % Final  . Eosinophils Absolute 08/30/2018 0.1  0.0 - 0.5 K/uL Final  . Basophils Relative 08/30/2018 1  % Final  . Basophils Absolute 08/30/2018 0.0  0.0 - 0.1 K/uL Final  . Immature Granulocytes 08/30/2018 1  % Final  . Abs Immature Granulocytes 08/30/2018 0.04  0.00 - 0.07 K/uL Final   Performed at [facility], [address] [place], [place] [address]     Pathology Orders Placed This Encounter  Procedures  . CBC with Differential/Platelet    Standing Status:   Future    Standing Expiration Date:   09/04/2020  . Comprehensive metabolic panel    Standing Status:   Future    Standing Expiration Date:   09/04/2020  . Lactate dehydrogenase    Standing Status:   Future    Standing Expiration Date:   09/04/2020  . Ferritin    Standing Status:   Future    Standing Expiration Date:   09/04/2020  . CEA    Standing Status:   Future    Standing Expiration Date:   09/04/2020  . CA 125    Standing Status:   Future    Standing Expiration Date:   09/04/2020       Zoila Shutter MD

## 2018-09-06 ENCOUNTER — Ambulatory Visit (HOSPITAL_COMMUNITY): Payer: Self-pay | Admitting: Hematology

## 2018-09-06 ENCOUNTER — Ambulatory Visit (HOSPITAL_COMMUNITY): Payer: Self-pay | Admitting: Internal Medicine

## 2018-10-01 DIAGNOSIS — S338XXA Sprain of other parts of lumbar spine and pelvis, initial encounter: Secondary | ICD-10-CM | POA: Diagnosis not present

## 2018-10-01 DIAGNOSIS — M9903 Segmental and somatic dysfunction of lumbar region: Secondary | ICD-10-CM | POA: Diagnosis not present

## 2018-10-01 DIAGNOSIS — M47816 Spondylosis without myelopathy or radiculopathy, lumbar region: Secondary | ICD-10-CM | POA: Diagnosis not present

## 2018-10-03 DIAGNOSIS — M47816 Spondylosis without myelopathy or radiculopathy, lumbar region: Secondary | ICD-10-CM | POA: Diagnosis not present

## 2018-10-03 DIAGNOSIS — S338XXA Sprain of other parts of lumbar spine and pelvis, initial encounter: Secondary | ICD-10-CM | POA: Diagnosis not present

## 2018-10-03 DIAGNOSIS — M9903 Segmental and somatic dysfunction of lumbar region: Secondary | ICD-10-CM | POA: Diagnosis not present

## 2018-10-07 ENCOUNTER — Other Ambulatory Visit: Payer: Self-pay | Admitting: Family Medicine

## 2018-10-07 DIAGNOSIS — M47816 Spondylosis without myelopathy or radiculopathy, lumbar region: Secondary | ICD-10-CM | POA: Diagnosis not present

## 2018-10-07 DIAGNOSIS — M9903 Segmental and somatic dysfunction of lumbar region: Secondary | ICD-10-CM | POA: Diagnosis not present

## 2018-10-07 DIAGNOSIS — S338XXA Sprain of other parts of lumbar spine and pelvis, initial encounter: Secondary | ICD-10-CM | POA: Diagnosis not present

## 2018-10-10 ENCOUNTER — Encounter (HOSPITAL_COMMUNITY): Payer: Self-pay | Admitting: Psychiatry

## 2018-10-10 ENCOUNTER — Ambulatory Visit (HOSPITAL_COMMUNITY): Payer: Medicare Other | Admitting: Psychiatry

## 2018-10-10 DIAGNOSIS — M47816 Spondylosis without myelopathy or radiculopathy, lumbar region: Secondary | ICD-10-CM | POA: Diagnosis not present

## 2018-10-10 DIAGNOSIS — M9903 Segmental and somatic dysfunction of lumbar region: Secondary | ICD-10-CM | POA: Diagnosis not present

## 2018-10-10 DIAGNOSIS — F331 Major depressive disorder, recurrent, moderate: Secondary | ICD-10-CM

## 2018-10-10 DIAGNOSIS — S338XXA Sprain of other parts of lumbar spine and pelvis, initial encounter: Secondary | ICD-10-CM | POA: Diagnosis not present

## 2018-10-10 MED ORDER — ESCITALOPRAM OXALATE 20 MG PO TABS: 20.0000 mg | ORAL_TABLET | Freq: Two times a day (BID) | ORAL | 3 refills | Status: DC

## 2018-10-10 MED ORDER — LORAZEPAM 1 MG PO TABS: 1.0000 mg | ORAL_TABLET | Freq: Three times a day (TID) | ORAL | 3 refills | Status: DC | PRN

## 2018-10-10 MED ORDER — DIVALPROEX SODIUM ER 500 MG PO TB24: 500.0000 mg | ORAL_TABLET | Freq: Every day | ORAL | 3 refills | Status: DC

## 2018-10-10 NOTE — Progress Notes (Signed)
BH MD/PA/NP OP Progress Note  10/10/2018 9:37 AM Abigail Wagner  MRN:  [id number]  Chief Complaint:  Chief Complaint    Anxiety; Depression     HPI: This patient is a 68 year old married white female lives with her husband in [place]. She has 3 children and 5 grandchildren. She is retired from [employer].  The patient states she's had depression since her 48s. She was hospitalized several times, last time being in 2010 when she took a drug overdose. She was going through a lot of family stress back then. She states that she was hospitalized at [facility] center and since then she has never wanted to go back. She's been quite stable despite her low doses of medication. She's also stopped drinking alcohol which is made a big difference.  The patient returns for follow-up after 4 months.  For the most part she has been doing well.  She has had no cancer recurrences.  She is having a lot of back pain and was getting injections but now is switched to chiropractic care and it seems to be helping her more.  She has had a lot of conflict with 1 of her sons who does not want to talk to her or her husband and she feels like there is nothing more she can do about this.  She is close to her other son and daughter.  Mood is generally good and the Ativan continues to help with anxiety.  She denies any mood swings depression or suicidal ideation. Visit Diagnosis:    ICD-10-CM   1. Major depressive disorder, recurrent episode, moderate (HCC) F33.1 escitalopram (LEXAPRO) 20 MG tablet    Past Psychiatric History: Past hospitalizations for depression and alcohol abuse, none since 2010  Past Medical History:  Past Medical History:  Diagnosis Date  . Allergy   . Anal carcinoma [facility]) onc[facility]  dr [facility]   dx 12/ 2016 [facility] --  chemo and radiation- Dr. Marcello Moores  . Anemia    due to her cancer  . Anxiety    Dr. Harrington Challenger at [place] (psychiatrist).    . Carcinoma of ovary, stage 3  [facility]) oncologist-  dr gehrig/  dr Doreene Eland (cancer center in [place] w/ [facility])--  no recurrency   dx 09/ 2015  Stage IIIB  papillary ovarian carcinoma  s/p  omentectomy and BSO &  chemotherapy (completed 12-07-2014)  . Chemotherapy induced nausea and vomiting 10/23/2014  . Chronic kidney disease    stones  . DDD (degenerative disc disease), lumbosacral   . GERD (gastroesophageal reflux disease)   . Headache(784.0)   . History of acute respiratory failure    05-30-2009  drug overdose-- (intubated for 2 days)  and aspiration pneumonia  . History of adenomatous polyp of colon 10/07/2015   tubular adenoma high grade dysplasia  . History of herpes genitalis   . History of kidney stones   . History of ovarian cancer 08/03/2015  . History of suicide attempt    per documentation in epic  05-30-2009  overdose benaodiazepine  . HTN (hypertension)    takes Metoprolol daily  . Hyperlipidemia   . Hypertension 09/27/2016  . IBS (irritable bowel syndrome)    takes Bentyl daily  . Internal carotid artery stenosis, left    <50%, ulcerative plaque at carotid bulb.   . Major depression   . Mood disorder ([place])   . OA (osteoarthritis)    both hips  . Ovarian cancer ([place])    2015-TAH/BSO  .  Prolapse of vaginal vault after hysterectomy 07/20/2015  . Rectovaginal fistula 08/05/2015  . Sigmoid diverticulosis   . Smokers' cough [facility])     Past Surgical History:  Procedure Laterality Date  . BREAST ENHANCEMENT SURGERY  1986  . [place]  . COLONOSCOPY N/A 10/07/2015   Procedure: COLONOSCOPY;  Surgeon: Rogene Houston, MD;  [facility];  Service: Endoscopy;  Laterality: N/A;  7:30  . EXPLORATORY LAPAROTOMY/ OMENTECTOMY/  BILATERAL SALPINGOOPHORECTOMY/  [facility] PLACEMENT  07-03-2014   [facility]  . KNEE ARTHROSCOPY Left 09-22-2004  . LAPAROSCOPY N/A 06/02/2014   Procedure: DIAGNOSTIC LAPAROSCOPY, OMENTAL BIOPSY, RIGHT OVARY BIOPSY, LYSIS OF ADHESIONS;  Surgeon: Fanny Skates,  MD;  Location: [place];  Service: General;  Laterality: N/A;  . MUCOSAL ADVANCEMENT FLAP N/A 06/15/2016   Procedure: EXCISION RECTOVAGINAOL FISTULA WITH MUCOSAL ADVANCEMENT FLAP;  Surgeon: Leighton Ruff, MD;  Location: [place];  Service: General;  Laterality: N/A;  . PLACEMENT OF SETON N/A 09/09/2015   Procedure: PLACEMENT OF SETON;  Surgeon: Leighton Ruff, MD;  Location: [place];  Service: General;  Laterality: N/A;  . PORT-A-CATH REMOVAL Right 08/17/2014   Procedure: REMOVAL INTRAPERITONEAL CHEMO PORT;  Surgeon: Fanny Skates, MD;  Location: [place];  Service: General;  Laterality: Right;  . PORTACATH PLACEMENT Right 08/17/2014   Procedure:  PLACE NEW PORT A CATH;  Surgeon: Fanny Skates, MD;  Location: [place];  Service: General;  Laterality: Right;  . RECTAL BIOPSY N/A 09/09/2015   Procedure: BIOPSY OF RECTOVAGINAL MASS;  Surgeon: Leighton Ruff, MD;  Location: [place];  Service: General;  Laterality: N/A;  . TUBAL LIGATION  YRS AGO  . VAGINAL HYSTERECTOMY  1981   fibroids    Family Psychiatric History: See below  Family History:  Family History  Problem Relation Age of Onset  . Bipolar disorder Mother   . Anxiety disorder Mother   . Dementia Mother   . Depression Mother   . Mental illness Mother   . Vision loss Mother   . Alzheimer's disease Mother   . Alcohol abuse Paternal Uncle   . Bipolar disorder Maternal Grandmother   . Dementia Maternal Grandmother   . Alzheimer's disease Maternal Grandmother   . Alcohol abuse Paternal Uncle   . Stroke Brother   . Deep vein thrombosis Son   . Heart disease Father   . Hyperlipidemia Father   . Hypertension Father   . Stroke Father   . Vision loss Father   . Atrial fibrillation Sister   . Heart disease Brother   . Heart disease Brother   . ADD / ADHD Neg Hx   . Drug abuse Neg Hx   . OCD Neg Hx   . Paranoid behavior Neg Hx   .  Schizophrenia Neg Hx   . Seizures Neg Hx   . Sexual abuse Neg Hx   . Physical abuse Neg Hx     Social History:  Social History   Socioeconomic History  . Marital status: Married    Spouse name: Coralyn Mark  . Number of children: 3  . Years of education: 48  . Highest education level: Not on file  Occupational History  . Occupation: retire    Comment: Animal nutritionist  . Financial resource strain: Not on file  . Food insecurity:    Worry: Not on file    Inability: Not on file  . Transportation needs:  Medical: Not on file    Non-medical: Not on file  Tobacco Use  . Smoking status: Current Every Day Smoker    Packs/day: 0.50    Years: 43.00    Pack years: 21.50    Types: Cigarettes  . Smokeless tobacco: Never Used  Substance and Sexual Activity  . Alcohol use: No  . Drug use: No  . Sexual activity: Not Currently    Birth control/protection: Surgical  Lifestyle  . Physical activity:    Days per week: Not on file    Minutes per session: Not on file  . Stress: Not on file  Relationships  . Social connections:    Talks on phone: Not on file    Gets together: Not on file    Attends religious service: Not on file    Active member of club or organization: Not on file    Attends meetings of clubs or organizations: Not on file    Relationship status: Not on file  Other Topics Concern  . Not on file  Social History Narrative   Lives at home with Coralyn Mark   Retired [employer]    Allergies: No Known Allergies  Metabolic Disorder Labs: No results found for: HGBA1C, MPG No results found for: PROLACTIN Lab Results  Component Value Date   CHOL 225 (H) 05/21/2018   TRIG 85 05/21/2018   HDL 53 05/21/2018   CHOLHDL 4.2 05/21/2018   VLDL 28 01/10/2017   LDLCALC 153 (H) 05/21/2018   LDLCALC 164 (H) 01/10/2017     Therapeutic Level Labs: No results found for: LITHIUM No results found for: VALPROATE No components found for:  CBMZ  Current Medications: Current  Outpatient Medications  Medication Sig Dispense Refill  . atorvastatin (LIPITOR) 20 MG tablet Take 1 tablet (20 mg total) by mouth daily. 90 tablet 1  . b complex vitamins tablet Take 1 tablet by mouth daily.    . cyclobenzaprine (FLEXERIL) 5 MG tablet Take 5 mg by mouth 3 (three) times daily as needed for muscle spasms.    . divalproex (DEPAKOTE ER) 500 MG 24 hr tablet Take 1 tablet (500 mg total) by mouth daily. 90 tablet 3  . escitalopram (LEXAPRO) 20 MG tablet Take 1 tablet (20 mg total) by mouth 2 (two) times daily. 90 tablet 3  . ketoconazole (NIZORAL) 2 % cream Apply 1 application topically daily. prn    . KRILL OIL PO Take 350 mg by mouth daily.    . lansoprazole (PREVACID) 15 MG capsule Take 15 mg by mouth as needed.     Marland Kitchen LORazepam (ATIVAN) 1 MG tablet Take 1 tablet (1 mg total) by mouth 3 (three) times daily as needed for anxiety. 90 tablet 3  . losartan (COZAAR) 50 MG tablet Take 1 tablet (50 mg total) by mouth daily. 90 tablet 3  . meloxicam (MOBIC) 15 MG tablet TAKE 1 TABLET BY MOUTH EVERY DAY 30 tablet 2  . Multiple Vitamin (MULTIVITAMIN) tablet Take 1 tablet by mouth daily.    . rosuvastatin (CRESTOR) 20 MG tablet Take 1 tablet (20 mg total) by mouth daily. 90 tablet 1  . valACYclovir (VALTREX) 1000 MG tablet Take 1 tablet (1,000 mg total) by mouth daily as needed. 5 tablet 11   No current facility-administered medications for this visit.      Musculoskeletal: Strength & Muscle Tone: within normal limits Gait & Station: normal Patient leans: N/A  Psychiatric Specialty Exam: Review of Systems  Musculoskeletal: Positive for back pain.  All other systems reviewed and are negative.   Blood pressure (!) 152/70, pulse 76, height 5\' 5"  (1.651 m), weight 173 lb (78.5 kg), SpO2 95 %.Body mass index is 28.79 kg/m.  General Appearance: Casual, Neat and Well Groomed  Eye Contact:  Good  Speech:  Clear and Coherent  Volume:  Normal  Mood:  Euthymic  Affect:  Appropriate and  Congruent  Thought Process:  Goal Directed  Orientation:  Full (Time, Place, and Person)  Thought Content: WDL   Suicidal Thoughts:  No  Homicidal Thoughts:  no  Memory:  Immediate;   Good Recent;   Good Remote;   Fair  Judgement:  Good  Insight:  Good  Psychomotor Activity:  Decreased  Concentration:  Concentration: Good and Attention Span: Good  Recall:  Good  Fund of Knowledge: Good  Language: Good  Akathisia:  No  Handed:  Right  AIMS (if indicated): not done  Assets:  Communication Skills Desire for Improvement Resilience Social Support Talents/Skills  ADL's:  Intact  Cognition: WNL  Sleep:  Good   Screenings: PHQ2-9     Office Visit from 05/14/2018 in [facility] [facility] Visit from 01/15/2018 in [facility] [place] from 01/08/2017 in [facility] Visit from 11/07/2016 in [facility]  PHQ-2 Total Score  1  1  0  0  PHQ-9 Total Score  4  -  -  -       Assessment and Plan: This patient is a 68 year old female with a history of depression and remote history of alcohol abuse as well as anxiety.  She is doing well on her current regimen.  She will continue Ativan 1 mg 3 times daily as needed for anxiety, Lexapro 20 mg twice daily for depression and Depakote ER 500 mg at bedtime for mood stabilization.  She will return to see me in 4 months   [name], MD 10/10/2018, 9:37 AM

## 2018-10-21 DIAGNOSIS — M47816 Spondylosis without myelopathy or radiculopathy, lumbar region: Secondary | ICD-10-CM | POA: Diagnosis not present

## 2018-10-21 DIAGNOSIS — M9903 Segmental and somatic dysfunction of lumbar region: Secondary | ICD-10-CM | POA: Diagnosis not present

## 2018-10-21 DIAGNOSIS — S338XXA Sprain of other parts of lumbar spine and pelvis, initial encounter: Secondary | ICD-10-CM | POA: Diagnosis not present

## 2018-11-06 DIAGNOSIS — M9903 Segmental and somatic dysfunction of lumbar region: Secondary | ICD-10-CM | POA: Diagnosis not present

## 2018-11-06 DIAGNOSIS — S338XXA Sprain of other parts of lumbar spine and pelvis, initial encounter: Secondary | ICD-10-CM | POA: Diagnosis not present

## 2018-11-06 DIAGNOSIS — M47816 Spondylosis without myelopathy or radiculopathy, lumbar region: Secondary | ICD-10-CM | POA: Diagnosis not present

## 2018-11-13 ENCOUNTER — Ambulatory Visit (INDEPENDENT_AMBULATORY_CARE_PROVIDER_SITE_OTHER): Payer: Medicare Other | Admitting: Surgery

## 2018-11-13 DIAGNOSIS — M5442 Lumbago with sciatica, left side: Secondary | ICD-10-CM | POA: Diagnosis not present

## 2018-11-13 DIAGNOSIS — G8929 Other chronic pain: Secondary | ICD-10-CM | POA: Diagnosis not present

## 2018-11-13 DIAGNOSIS — M5441 Lumbago with sciatica, right side: Secondary | ICD-10-CM | POA: Diagnosis not present

## 2018-11-13 DIAGNOSIS — M5416 Radiculopathy, lumbar region: Secondary | ICD-10-CM

## 2018-11-13 DIAGNOSIS — M48062 Spinal stenosis, lumbar region with neurogenic claudication: Secondary | ICD-10-CM

## 2018-11-13 MED ORDER — TRAMADOL HCL 50 MG PO TABS: 50.0000 mg | ORAL_TABLET | Freq: Two times a day (BID) | ORAL | 0 refills | Status: DC | PRN

## 2018-11-13 NOTE — Progress Notes (Signed)
Office Visit Note   Patient: Abigail Wagner           Date of Birth: 12-30-1950           MRN: [id number] Visit Date: 11/13/2018              Requested by: Susy Frizzle, MD [address] [place], [place] [address] PCP: Susy Frizzle, MD   Assessment & Plan: Visit Diagnoses:  1. Spinal stenosis of lumbar region with neurogenic claudication   2. Radiculopathy, lumbar region   3. Chronic low back pain with bilateral sciatica, unspecified back pain laterality     Plan: With patient's worsening was I will repeat lumbar MRI compared to previous study that was done in August.  Also recommend density test.  Follow with Dr. Lorin Mercy after completion of both studies to discuss results and further treatment options.  Patient states that her current lumbar symptoms are bad enough at this point that if surgical intervention is indicated that she is wanting to go that route. Prescription for tramadol given.  Follow-Up Instructions: Return in about 3 weeks (around 12/04/2018) for with Dr Lorin Mercy to review studies and discuss surgery lumbar.   Orders:  Orders Placed This Encounter  Procedures  . MR Lumbar Spine w/o contrast  . DG BONE DENSITY (DXA)   Meds ordered this encounter  Medications  . traMADol (ULTRAM) 50 MG tablet    Sig: Take 1 tablet (50 mg total) by mouth every 12 (twelve) hours as needed.    Dispense:  30 tablet    Refill:  0      Procedures: No procedures performed   Clinical Data: No additional findings.   Subjective: Chief Complaint  Patient presents with  . Lower Back - Pain    HPI 68-year-old white female with history of multilevel lumbar spondylosis comes in for recheck.  Last seen by Dr. Lorin Mercy August 2019.  She did have ESI with Dr. Ernestina Patches and states that this helped for about a week.  He is of ongoing low back pain with radiation into both hips.  Also describes having neurogenic claudication symptoms.  Symptoms have worsened since her lumbar MRI  August 2019.   Objective: Vital Signs: There were no vitals taken for this visit.  Physical Exam Eyes:     Extraocular Movements: Extraocular movements intact.     Pupils: Pupils are equal, round, and reactive to light.  Musculoskeletal:     Comments: Is intact.  Bilateral lumbar paraspinal tenderness.  The bilateral SI joints.  Negative logroll bilateral hips.  Positive right straight leg raise.  Skin:    General: Skin is warm and dry.  Neurological:     General: No focal deficit present.     Mental Status: She is alert.     Ortho Exam  Specialty Comments:  No specialty comments available.  Imaging: No results found.   PMFS History: Patient Active Problem List   Diagnosis Date Noted  . Internal carotid artery stenosis, left   . Neural foraminal stenosis of lumbar spine 05/16/2018  . Genetic testing 05/24/2017  . HLD (hyperlipidemia) 02/07/2017  . Normocytic normochromic anemia 01/10/2017  . GERD (gastroesophageal reflux disease) 09/27/2016  . Back pain 09/27/2016  . Anxiety 09/27/2016  . Allergy 09/27/2016  . Radiation proctitis 12/23/2015  . Anal cancer ([place]) 11/19/2015  . History of ovarian cancer 08/03/2015  . Prolapse of vaginal vault after hysterectomy 07/20/2015  . Migraines 05/20/2015  . Port-A-Cath in  place 01/13/2015  . Epithelial ovarian cancer, FIGO stage IIIA ([place]) 07/16/2014  . Abdominal carcinomatosis ([place]) 06/18/2014  . Bipolar 1 disorder ([place]) 12/27/2011  . Kidney stones 12/27/2011  . IBS (irritable colon syndrome) 12/27/2011  . Depression 11/07/2011   Past Medical History:  Diagnosis Date  . Allergy   . Anal carcinoma [facility]) oncologist-  dr [facility]   dx 12/ 2016 [facility] --  chemo and radiation- Dr. Marcello Moores  . Anemia    due to her cancer  . Anxiety    Dr. Harrington Challenger at [place] (psychiatrist).    . Carcinoma of ovary, stage 3 [facility]) oncologist-  dr gehrig/  dr Doreene Eland (cancer center in [place] [facility]/ [facility])--  no recurrency   dx 09/  2015  Stage IIIB  papillary ovarian carcinoma  s/p  omentectomy and BSO &  chemotherapy (completed 12-07-2014)  . Chemotherapy induced nausea and vomiting 10/23/2014  . Chronic kidney disease    stones  . DDD (degenerative disc disease), lumbosacral   . GERD (gastroesophageal reflux disease)   . Headache(784.0)   . History of acute respiratory failure    05-30-2009  drug overdose-- (intubated for 2 days)  and aspiration pneumonia  . History of adenomatous polyp of colon 10/07/2015   tubular adenoma high grade dysplasia  . History of herpes genitalis   . History of kidney stones   . History of ovarian cancer 08/03/2015  . History of suicide attempt    per documentation in epic  05-30-2009  overdose benaodiazepine  . HTN (hypertension)    takes Metoprolol daily  . Hyperlipidemia   . Hypertension 09/27/2016  . IBS (irritable bowel syndrome)    takes Bentyl daily  . Internal carotid artery stenosis, left    <50%, ulcerative plaque at carotid bulb.   . Major depression   . Mood disorder ([place])   . OA (osteoarthritis)    both hips  . Ovarian cancer ([place])    2015-TAH/BSO  . Prolapse of vaginal vault after hysterectomy 07/20/2015  . Rectovaginal fistula 08/05/2015  . Sigmoid diverticulosis   . Smokers' cough ([place])     Family History  Problem Relation Age of Onset  . Bipolar disorder Mother   . Anxiety disorder Mother   . Dementia Mother   . Depression Mother   . Mental illness Mother   . Vision loss Mother   . Alzheimer's disease Mother   . Alcohol abuse Paternal Uncle   . Bipolar disorder Maternal Grandmother   . Dementia Maternal Grandmother   . Alzheimer's disease Maternal Grandmother   . Alcohol abuse Paternal Uncle   . Stroke Brother   . Deep vein thrombosis Son   . Heart disease Father   . Hyperlipidemia Father   . Hypertension Father   . Stroke Father   . Vision loss Father   . Atrial fibrillation Sister   . Heart disease Brother   . Heart disease Brother   . ADD  / ADHD Neg Hx   . Drug abuse Neg Hx   . OCD Neg Hx   . Paranoid behavior Neg Hx   . Schizophrenia Neg Hx   . Seizures Neg Hx   . Sexual abuse Neg Hx   . Physical abuse Neg Hx     Past Surgical History:  Procedure Laterality Date  . BREAST ENHANCEMENT SURGERY  1986  . [place]  . COLONOSCOPY N/A 10/07/2015   Procedure: COLONOSCOPY;  Surgeon: Rogene Houston, MD;  [facility];  Service: Endoscopy;  Laterality: N/A;  7:30  . EXPLORATORY LAPAROTOMY/ OMENTECTOMY/  BILATERAL SALPINGOOPHORECTOMY/  [facility] PLACEMENT  07-03-2014   [facility]  . KNEE ARTHROSCOPY Left 09-22-2004  . LAPAROSCOPY N/A 06/02/2014   Procedure: DIAGNOSTIC LAPAROSCOPY, OMENTAL BIOPSY, RIGHT OVARY BIOPSY, LYSIS OF ADHESIONS;  Surgeon: Fanny Skates, MD;  Location: [place];  Service: General;  Laterality: N/A;  . MUCOSAL ADVANCEMENT FLAP N/A 06/15/2016   Procedure: EXCISION RECTOVAGINAOL FISTULA WITH MUCOSAL ADVANCEMENT FLAP;  Surgeon: Leighton Ruff, MD;  Location: [place];  Service: General;  Laterality: N/A;  . PLACEMENT OF SETON N/A 09/09/2015   Procedure: PLACEMENT OF SETON;  Surgeon: Leighton Ruff, MD;  Location: [place];  Service: General;  Laterality: N/A;  . PORT-A-CATH REMOVAL Right 08/17/2014   Procedure: REMOVAL INTRAPERITONEAL CHEMO PORT;  Surgeon: Fanny Skates, MD;  Location: [place];  Service: General;  Laterality: Right;  . PORTACATH PLACEMENT Right 08/17/2014   Procedure:  PLACE NEW PORT A CATH;  Surgeon: Fanny Skates, MD;  Location: [place];  Service: General;  Laterality: Right;  . RECTAL BIOPSY N/A 09/09/2015   Procedure: BIOPSY OF RECTOVAGINAL MASS;  Surgeon: Leighton Ruff, MD;  Location: [place];  Service: General;  Laterality: N/A;  . TUBAL LIGATION  YRS AGO  . VAGINAL HYSTERECTOMY  1981   fibroids   Social History   Occupational History  . Occupation: retire    Comment:  [employer]  Tobacco Use  . Smoking status: Current Every Day Smoker    Packs/day: 0.50    Years: 43.00    Pack years: 21.50    Types: Cigarettes  . Smokeless tobacco: Never Used  Substance and Sexual Activity  . Alcohol use: No  . Drug use: No  . Sexual activity: Not Currently    Birth control/protection: Surgical

## 2018-11-14 ENCOUNTER — Ambulatory Visit (HOSPITAL_COMMUNITY)
Admission: RE | Admit: 2018-11-14 | Discharge: 2018-11-14 | Disposition: A | Discharge status: Home / Self Care | Payer: Medicare Other | Source: Ambulatory Visit | Attending: Surgery | Admitting: Surgery

## 2018-11-14 DIAGNOSIS — M48062 Spinal stenosis, lumbar region with neurogenic claudication: Secondary | ICD-10-CM | POA: Insufficient documentation

## 2018-11-14 DIAGNOSIS — M5416 Radiculopathy, lumbar region: Secondary | ICD-10-CM | POA: Diagnosis not present

## 2018-11-22 ENCOUNTER — Encounter (HOSPITAL_COMMUNITY): Payer: Self-pay

## 2018-11-27 ENCOUNTER — Encounter (INDEPENDENT_AMBULATORY_CARE_PROVIDER_SITE_OTHER): Payer: Self-pay | Admitting: Surgery

## 2018-11-27 ENCOUNTER — Ambulatory Visit (INDEPENDENT_AMBULATORY_CARE_PROVIDER_SITE_OTHER): Payer: Medicare Other | Admitting: Orthopaedic Surgery

## 2018-11-27 ENCOUNTER — Ambulatory Visit (INDEPENDENT_AMBULATORY_CARE_PROVIDER_SITE_OTHER): Payer: Medicare Other | Admitting: Surgery

## 2018-11-27 ENCOUNTER — Encounter (INDEPENDENT_AMBULATORY_CARE_PROVIDER_SITE_OTHER): Payer: Self-pay | Admitting: Orthopaedic Surgery

## 2018-11-27 VITALS — BP 139/79 | HR 85 | Ht 65.0 in | Wt 173.0 lb

## 2018-11-27 VITALS — Ht 65.0 in | Wt 173.0 lb

## 2018-11-27 DIAGNOSIS — M9983 Other biomechanical lesions of lumbar region: Secondary | ICD-10-CM

## 2018-11-27 DIAGNOSIS — M48061 Spinal stenosis, lumbar region without neurogenic claudication: Secondary | ICD-10-CM

## 2018-11-27 DIAGNOSIS — M48062 Spinal stenosis, lumbar region with neurogenic claudication: Secondary | ICD-10-CM

## 2018-11-27 NOTE — Progress Notes (Signed)
Office Visit Note   Patient: Abigail Wagner           Date of Birth: Mar 13, 1951           MRN: [id number] Visit Date: 11/27/2018              Requested by: Susy Frizzle, MD [id number] [place],  [id number] PCP: Susy Frizzle, MD   Assessment & Plan: Visit Diagnoses: No diagnosis found.  Plan: MRI scan is reviewed and compared to the previous scan.  Patient has a lumbar scoliosis with multilevel degenerative changes.  There are small extruded disc fragment on the right at L3-4 but short sometimes on the right sometimes on the left mostly aching in her back.  She has multilevel facet degenerative changes.  Previous epidural only gave her relief for a week.  She has not had the bone density test done.  We will set her up for some physical therapy for core strengthening and treatment of her low back pain.  Recheck 3 months.  She like to have her therapy at [facility] Instructions: Return in about 2 months (around 01/27/2019).   Orders:  No orders of the defined types were placed in this encounter.  No orders of the defined types were placed in this encounter.     Procedures: No procedures performed   Clinical Data: No additional findings.   Subjective: Chief Complaint  Patient presents with  . Lower Back - Follow-up    MRI Review    HPI [age]-year-old female returns with ongoing problems with low back pain.  She has some known scoliosis and new MRI scan is been obtained.  She states some she has never scheduled for a bone density test.  She is used ibuprofen topical rubs heat and ice.  No bowel bladder symptoms back aching sometimes worse in the right sometimes worse on the left.  She has been able ambulate but when she is more active she has more aching pain in her back.  Review of Systems reviewed updated unchanged from 04/18/2018 visit.  Of note is bipolar disorder depression anal cancer with multiple scans no current recurrence  radiation proctitis.   Objective: Vital Signs: BP 139/79   Pulse 85   Ht 5\' 5"  (1.651 m)   Wt 173 lb (78.5 kg)   BMI 28.79 kg/m   Physical Exam Constitutional:      Appearance: She is well-developed.  HENT:     Head: Normocephalic.     Right Ear: External ear normal.     Left Ear: External ear normal.  Eyes:     Pupils: Pupils are equal, round, and reactive to light.  Neck:     Thyroid: No thyromegaly.     Trachea: No tracheal deviation.  Cardiovascular:     Rate and Rhythm: Normal rate.  Pulmonary:     Effort: Pulmonary effort is normal.  Abdominal:     Palpations: Abdomen is soft.  Skin:    General: Skin is warm and dry.  Neurological:     Mental Status: She is alert and oriented to person, place, and time.  Psychiatric:        Behavior: Behavior normal.     Ortho Exam patient gets from sitting to standing comfortably she has some pain with straight leg raising on the right at 80degrees.  Knee and ankle jerk are intact.  Negative logroll the hips knees reach full extension pulses  are intact no rash over exposed skin.  Specialty Comments:  No specialty comments available.  Imaging: CLINICAL DATA:  Lumbar spinal stenosis with neurogenic claudication. Lumbar radiculopathy  EXAM: MRI LUMBAR SPINE WITHOUT CONTRAST  TECHNIQUE: Multiplanar, multisequence MR imaging of the lumbar spine was performed. No intravenous contrast was administered.  COMPARISON:  Lumbar MRI 05/08/2018  FINDINGS: Segmentation:  Normal  Alignment:  . mild retrolisthesis L1-2.  Mild anterolisthesis L3-4.  Vertebrae: Negative for fracture or mass. No significant bone marrow edema.  Conus medullaris and cauda equina: Conus extends to the T12-L1 level. Conus and cauda equina appear normal.  Paraspinal and other soft tissues: Negative for paraspinous mass or fluid collection.  Disc levels:  L1-2: Disc degeneration with disc bulging and mild spurring. No significant  stenosis. Mild facet degeneration.  L2-3: Moderate disc degeneration with disc space narrowing and spurring. Right-sided disc protrusion and osteophyte is unchanged causing subarticular stenosis on the right and mild to moderate right foraminal encroachment. Bilateral facet hypertrophy. Spinal canal not stenotic.  L3-4: Disc degeneration. Small extruded disc fragment on the right extending cranially into the foramen is unchanged. There is subarticular and foraminal stenosis on the right which is unchanged. Possible right L3 and L4 nerve root impingement. Moderate to advanced facet degeneration with mild spinal stenosis  L4-5: Disc degeneration and spurring on the left with left-sided facet hypertrophy causing subarticular and foraminal encroachment on the left. This is moderately severe and unchanged from the prior study  L5-S1: Disc and facet degeneration.  No significant spinal stenosis.  IMPRESSION: Lumbar scoliosis and multilevel degenerative change as above. Findings are stable compared with the prior MRI.   Electronically Signed   By: Franchot Gallo M.D.   On: 11/14/2018 16:46   PMFS History: Patient Active Problem List   Diagnosis Date Noted  . Internal carotid artery stenosis, left   . Neural foraminal stenosis of lumbar spine 05/16/2018  . Genetic testing 05/24/2017  . HLD (hyperlipidemia) 02/07/2017  . Normocytic normochromic anemia 01/10/2017  . GERD (gastroesophageal reflux disease) 09/27/2016  . Back pain 09/27/2016  . Anxiety 09/27/2016  . Allergy 09/27/2016  . Radiation proctitis 12/23/2015  . Anal cancer ([place]) 11/19/2015  . History of ovarian cancer 08/03/2015  . Prolapse of vaginal vault after hysterectomy 07/20/2015  . Migraines 05/20/2015  . Port-A-Cath in place 01/13/2015  . Epithelial ovarian cancer, FIGO stage IIIA ([place]) 07/16/2014  . Abdominal carcinomatosis ([place]) 06/18/2014  . Bipolar 1 disorder ([place]) 12/27/2011  . Kidney stones  12/27/2011  . IBS (irritable colon syndrome) 12/27/2011  . Depression 11/07/2011   Past Medical History:  Diagnosis Date  . Allergy   . Anal carcinoma [facility]) oncologist-  dr Whitney Muse ([facility])   dx 12/ 2016 [facility] --  chemo and radiation- Dr. Marcello Moores  . Anemia    due to her cancer  . Anxiety    Dr. Harrington Challenger at [place] (psychiatrist).    . Carcinoma of ovary, stage 3 [facility]) oncologist-  dr gehrig/  dr Doreene Eland (cancer center in [place] w/ [facility])--  no recurrency   dx 09/ 2015  Stage IIIB  papillary ovarian carcinoma  s/p  omentectomy and BSO &  chemotherapy (completed 12-07-2014)  . Chemotherapy induced nausea and vomiting 10/23/2014  . Chronic kidney disease    stones  . DDD (degenerative disc disease), lumbosacral   . GERD (gastroesophageal reflux disease)   . Headache(784.0)   . History of acute respiratory failure    05-30-2009  drug overdose-- (intubated for  2 days)  and aspiration pneumonia  . History of adenomatous polyp of colon 10/07/2015   tubular adenoma high grade dysplasia  . History of herpes genitalis   . History of kidney stones   . History of ovarian cancer 08/03/2015  . History of suicide attempt    per documentation in epic  05-30-2009  overdose benaodiazepine  . HTN (hypertension)    takes Metoprolol daily  . Hyperlipidemia   . Hypertension 09/27/2016  . IBS (irritable bowel syndrome)    takes Bentyl daily  . Internal carotid artery stenosis, left    <50%, ulcerative plaque at carotid bulb.   . Major depression   . Mood disorder ([place])   . OA (osteoarthritis)    both hips  . Ovarian cancer ([place])    2015-TAH/BSO  . Prolapse of vaginal vault after hysterectomy 07/20/2015  . Rectovaginal fistula 08/05/2015  . Sigmoid diverticulosis   . Smokers' cough ([place])     Family History  Problem Relation Age of Onset  . Bipolar disorder Mother   . Anxiety disorder Mother   . Dementia Mother   . Depression Mother   . Mental illness Mother   . Vision loss Mother     . Alzheimer's disease Mother   . Alcohol abuse Paternal Uncle   . Bipolar disorder Maternal Grandmother   . Dementia Maternal Grandmother   . Alzheimer's disease Maternal Grandmother   . Alcohol abuse Paternal Uncle   . Stroke Brother   . Deep vein thrombosis Son   . Heart disease Father   . Hyperlipidemia Father   . Hypertension Father   . Stroke Father   . Vision loss Father   . Atrial fibrillation Sister   . Heart disease Brother   . Heart disease Brother   . ADD / ADHD Neg Hx   . Drug abuse Neg Hx   . OCD Neg Hx   . Paranoid behavior Neg Hx   . Schizophrenia Neg Hx   . Seizures Neg Hx   . Sexual abuse Neg Hx   . Physical abuse Neg Hx     Past Surgical History:  Procedure Laterality Date  . BREAST ENHANCEMENT SURGERY  1986  . [place]  . COLONOSCOPY N/A 10/07/2015   Procedure: COLONOSCOPY;  Surgeon: Rogene Houston, MD;  [facility];  Service: Endoscopy;  Laterality: N/A;  7:30  . EXPLORATORY LAPAROTOMY/ OMENTECTOMY/  BILATERAL SALPINGOOPHORECTOMY/  [facility] PLACEMENT  07-03-2014   [facility]  . KNEE ARTHROSCOPY Left 09-22-2004  . LAPAROSCOPY N/A 06/02/2014   Procedure: DIAGNOSTIC LAPAROSCOPY, OMENTAL BIOPSY, RIGHT OVARY BIOPSY, LYSIS OF ADHESIONS;  Surgeon: Fanny Skates, MD;  Location: [place];  Service: General;  Laterality: N/A;  . MUCOSAL ADVANCEMENT FLAP N/A 06/15/2016   Procedure: EXCISION RECTOVAGINAOL FISTULA WITH MUCOSAL ADVANCEMENT FLAP;  Surgeon: Leighton Ruff, MD;  Location: [place];  Service: General;  Laterality: N/A;  . PLACEMENT OF SETON N/A 09/09/2015   Procedure: PLACEMENT OF SETON;  Surgeon: Leighton Ruff, MD;  Location: [place];  Service: General;  Laterality: N/A;  . PORT-A-CATH REMOVAL Right 08/17/2014   Procedure: REMOVAL INTRAPERITONEAL CHEMO PORT;  Surgeon: Fanny Skates, MD;  Location: [place];  Service: General;  Laterality: Right;  . PORTACATH PLACEMENT Right  08/17/2014   Procedure:  PLACE NEW PORT A CATH;  Surgeon: Fanny Skates, MD;  Location: [place];  Service: General;  Laterality: Right;  . RECTAL BIOPSY N/A 09/09/2015  Procedure: BIOPSY OF RECTOVAGINAL MASS;  Surgeon: Leighton Ruff, MD;  Location: [place];  Service: General;  Laterality: N/A;  . TUBAL LIGATION  YRS AGO  . VAGINAL HYSTERECTOMY  1981   fibroids   Social History   Occupational History  . Occupation: retire    Comment: [employer]  Tobacco Use  . Smoking status: Current Every Day Smoker    Packs/day: 0.50    Years: 43.00    Pack years: 21.50    Types: Cigarettes  . Smokeless tobacco: Never Used  Substance and Sexual Activity  . Alcohol use: No  . Drug use: No  . Sexual activity: Not Currently    Birth control/protection: Surgical

## 2018-11-27 NOTE — Addendum Note (Signed)
Addended by: Meyer Cory on: 11/27/2018 11:14 AM   Modules accepted: Orders

## 2018-11-29 ENCOUNTER — Encounter (HOSPITAL_COMMUNITY): Payer: Self-pay

## 2018-12-02 ENCOUNTER — Other Ambulatory Visit: Payer: Self-pay

## 2018-12-02 ENCOUNTER — Encounter (HOSPITAL_COMMUNITY): Payer: Self-pay

## 2018-12-02 ENCOUNTER — Inpatient Hospital Stay (HOSPITAL_COMMUNITY): Payer: Medicare Other | Attending: Internal Medicine

## 2018-12-02 DIAGNOSIS — Z452 Encounter for adjustment and management of vascular access device: Secondary | ICD-10-CM | POA: Insufficient documentation

## 2018-12-02 DIAGNOSIS — Z85048 Personal history of other malignant neoplasm of rectum, rectosigmoid junction, and anus: Secondary | ICD-10-CM | POA: Diagnosis not present

## 2018-12-02 MED ORDER — HEPARIN SOD (PORK) LOCK FLUSH 100 UNIT/ML IV SOLN
500.0000 [IU] | Freq: Once | INTRAVENOUS | Status: AC
  Administered 2018-12-02: 500 [IU] via INTRAVENOUS

## 2018-12-02 MED ORDER — SODIUM CHLORIDE 0.9% FLUSH
10.0000 mL | INTRAVENOUS | Status: DC | PRN
  Administered 2018-12-02: 10 mL via INTRAVENOUS
  Filled 2018-12-02: qty 10

## 2018-12-02 NOTE — Progress Notes (Signed)
Abigail Wagner tolerated portacath flush well without complaints or incident. Port accessed with 20 gauge needle with blood return  Noted then flushed with 10 ml NS and 5 ml Heparin easily per protocol then de-accessed. VSS Pt discharged self ambulatory in satisfactory condition

## 2018-12-02 NOTE — Patient Instructions (Signed)
[facility] at [facility] Discharge Instructions  Portacath flushed per protocol today. Follow-up as scheduled. Call clinic for any questions or concerns   Thank you for choosing [facility] at [facility] to provide your oncology and hematology care.  To afford each patient quality time with our provider, please arrive at least 15 minutes before your scheduled appointment time.   If you have a lab appointment with the Cancer Center please come in thru the  Main Entrance and check in at the main information desk  You need to re-schedule your appointment should you arrive 10 or more minutes late.  We strive to give you quality time with our providers, and arriving late affects you and other patients whose appointments are after yours.  Also, if you no show three or more times for appointments you may be dismissed from the clinic at the providers discretion.     Again, thank you for choosing [facility].  Our hope is that these requests will decrease the amount of time that you wait before being seen by our physicians.       _____________________________________________________________  Should you have questions after your visit to [facility], please contact our office at [phone] between the hours of 8:00 a.m. and 4:30 p.m.  Voicemails left after 4:00 p.m. will not be returned until the following business day.  For prescription refill requests, have your pharmacy contact our office and allow 72 hours.    Cancer Center Support Programs:   > Cancer Support Group  2nd Tuesday of the month 1pm-2pm, Journey Room   

## 2018-12-04 ENCOUNTER — Other Ambulatory Visit: Payer: Self-pay

## 2018-12-04 ENCOUNTER — Ambulatory Visit (HOSPITAL_COMMUNITY): Payer: Medicare Other | Attending: Orthopaedic Surgery

## 2018-12-04 ENCOUNTER — Encounter (HOSPITAL_COMMUNITY): Payer: Self-pay

## 2018-12-04 DIAGNOSIS — M6281 Muscle weakness (generalized): Secondary | ICD-10-CM | POA: Diagnosis not present

## 2018-12-04 DIAGNOSIS — M545 Low back pain, unspecified: Secondary | ICD-10-CM

## 2018-12-04 DIAGNOSIS — R29898 Other symptoms and signs involving the musculoskeletal system: Secondary | ICD-10-CM | POA: Insufficient documentation

## 2018-12-04 NOTE — Therapy (Signed)
[place], [place], [address] Phone: [phone]   Fax:  [phone]  Physical Therapy Evaluation  Patient Details  Name: Abigail Wagner MRN: [id number] Date of Birth: 05-Oct-1950 Referring Provider (PT): Marybelle Killings, MD   Encounter Date: 12/04/2018  PT End of Session - 12/04/18 1608    Visit Number  1    Number of Visits  13    Date for PT Re-Evaluation  01/15/19   mini reassess 12/25/18   Authorization Type  [employer] Medicare (Secondary: UHC Medicare)    Authorization Time Period  12/04/18 to 01/15/19    Authorization - Visit Number  1    Authorization - Number of Visits  10    PT Start Time  6144    PT Stop Time  1341    PT Time Calculation (min)  42 min    Activity Tolerance  Patient tolerated treatment well;Patient limited by pain    Behavior During Therapy  [facility] for tasks assessed/performed       Past Medical History:  Diagnosis Date  . Allergy   . Anal carcinoma [facility]) oncologist-  dr Whitney Muse ([facility])   dx 12/ 2016 [facility] --  chemo and radiation- Dr. Marcello Moores  . Anemia    due to her cancer  . Anxiety    Dr. Harrington Challenger at [place] (psychiatrist).    . Carcinoma of ovary, stage 3 [facility]) oncologist-  dr gehrig/  dr Doreene Eland (cancer center in [place]/ [facility])--  no recurrency   dx 09/ 2015  Stage IIIB  papillary ovarian carcinoma  s/p  omentectomy and BSO &  chemotherapy (completed 12-07-2014)  . Chemotherapy induced nausea and vomiting 10/23/2014  . Chronic kidney disease    stones  . DDD (degenerative disc disease), lumbosacral   . GERD (gastroesophageal reflux disease)   . Headache(784.0)   . History of acute respiratory failure    05-30-2009  drug overdose-- (intubated for 2 days)  and aspiration pneumonia  . History of adenomatous polyp of colon 10/07/2015   tubular adenoma high grade dysplasia  . History of herpes genitalis   . History of kidney stones   . History of ovarian cancer 08/03/2015  . History  of suicide attempt    per documentation in epic  05-30-2009  overdose benaodiazepine  . HTN (hypertension)    takes Metoprolol daily  . Hyperlipidemia   . Hypertension 09/27/2016  . IBS (irritable bowel syndrome)    takes Bentyl daily  . Internal carotid artery stenosis, left    <50%, ulcerative plaque at carotid bulb.   . Major depression   . Mood disorder ([place])   . OA (osteoarthritis)    both hips  . Ovarian cancer ([place])    2015-TAH/BSO  . Prolapse of vaginal vault after hysterectomy 07/20/2015  . Rectovaginal fistula 08/05/2015  . Sigmoid diverticulosis   . Smokers' cough [facility])     Past Surgical History:  Procedure Laterality Date  . BREAST ENHANCEMENT SURGERY  1986  . [place]  . COLONOSCOPY N/A 10/07/2015   Procedure: COLONOSCOPY;  Surgeon: Rogene Houston, MD;  [facility];  Service: Endoscopy;  Laterality: N/A;  7:30  . EXPLORATORY LAPAROTOMY/ OMENTECTOMY/  BILATERAL SALPINGOOPHORECTOMY/  [facility] PLACEMENT  07-03-2014   [facility]  . KNEE ARTHROSCOPY Left 09-22-2004  . LAPAROSCOPY N/A 06/02/2014   Procedure: DIAGNOSTIC LAPAROSCOPY, OMENTAL BIOPSY, RIGHT OVARY BIOPSY, LYSIS OF ADHESIONS;  Surgeon: Fanny Skates, MD;  Location: [place] OR;  Service: General;  Laterality: N/A;  . MUCOSAL ADVANCEMENT FLAP N/A 06/15/2016   Procedure: EXCISION RECTOVAGINAOL FISTULA WITH MUCOSAL ADVANCEMENT FLAP;  Surgeon: Leighton Ruff, MD;  Location: [place];  Service: General;  Laterality: N/A;  . PLACEMENT OF SETON N/A 09/09/2015   Procedure: PLACEMENT OF SETON;  Surgeon: Leighton Ruff, MD;  Location: [place];  Service: General;  Laterality: N/A;  . PORT-A-CATH REMOVAL Right 08/17/2014   Procedure: REMOVAL INTRAPERITONEAL CHEMO PORT;  Surgeon: Fanny Skates, MD;  Location: [place];  Service: General;  Laterality: Right;  . PORTACATH PLACEMENT Right 08/17/2014   Procedure:  PLACE NEW PORT A CATH;  Surgeon: Fanny Skates, MD;  Location: [place];  Service: General;  Laterality: Right;  . RECTAL BIOPSY N/A 09/09/2015   Procedure: BIOPSY OF RECTOVAGINAL MASS;  Surgeon: Leighton Ruff, MD;  Location: [place];  Service: General;  Laterality: N/A;  . TUBAL LIGATION  YRS AGO  . VAGINAL HYSTERECTOMY  1981   fibroids    There were no vitals filed for this visit.   Subjective Assessment - 12/04/18 1303    Subjective  Pt states that she has degenerative disc disease, a lot of arthritis in her back, and states that her R shoulder is lower and her L hip is higher. She states that she can't walk very far due to pain in her back which will go into her lateral hips. This pain started 2MA of insidious onset. Cortisone shots were unsuccessful, chiropractic work was unsuccessful, and Dr. Lorin Mercy ordered an MRI which showed (per chart review) scholiosis and multilevel degenerative changes. She states that she has to take a lot of breaks when standing and cleaning. She denies any n/t, b/b issues, and no radicular pain, only a dull achy pain that goes into her lateral hips. She denies any significant ankle or shoulder injuries in her past. Walking, standing, HH chores, and descending stairs are all difficult for her.     Limitations  House hold activities;Walking;Standing    How long can you sit comfortably?  position of comfort    How long can you stand comfortably?  10-57mins    How long can you walk comfortably?  5 mins    Diagnostic tests  MRI 11/14/18    Patient Stated Goals  get better    Currently in Pain?  No/denies         [facility] PT Assessment - 12/04/18 0001      Assessment   Medical Diagnosis  low back pain, DDD    Referring Provider (PT)  Marybelle Killings, MD    Onset Date/Surgical Date  10/05/18    Next MD Visit  2 months    Prior Therapy  none      Balance Screen   Has the patient fallen in the past 6 months  Yes    How many times?  1   tripped over a chair leg   Has  the patient had a decrease in activity level because of a fear of falling?   Yes    Is the patient reluctant to leave their home because of a fear of falling?   Yes      Prior Function   Level of Independence  Independent    Vocation  Retired    Leisure  work Building services engineer puzzles      Observation/Other Assessments   Focus on Therapeutic Outcomes ([place])   to  be completed next month      ROM / Strength   AROM / PROM / Strength  AROM;Strength      AROM   AROM Assessment Site  Lumbar    Lumbar Flexion  90    Lumbar Extension  25   pain   Lumbar - Right Side Bend  10   R sided LBP   Lumbar - Left Side Bend  5   L sided LBP   Lumbar - Right Rotation  50% limited    Lumbar - Left Rotation  75% limited      Strength   Strength Assessment Site  Hip;Knee;Ankle    Right Hip Flexion  5/5    Right Hip Extension  2+/5    Right Hip ABduction  4/5    Left Hip Flexion  5/5    Left Hip Extension  3+/5    Left Hip ABduction  4/5    Right Knee Flexion  3-/5   didn't want to get a cramp   Right Knee Extension  5/5    Left Knee Flexion  4-/5    Left Knee Extension  5/5    Right Ankle Dorsiflexion  5/5    Left Ankle Dorsiflexion  5/5      Flexibility   Soft Tissue Assessment /Muscle Length  yes    Hamstrings  WNL BLE      Palpation   Spinal mobility  hypomobile throughout thoracic and lumbar spine    Palpation comment  max restrictions thorughout lumbar paraspinals, thoracic paraspinals, thoracolumbar fascia, and glutes throughout      Special Tests    Special Tests  Lumbar    Lumbar Tests  Straight Leg Raise      Straight Leg Raise   Findings  Negative    Comment  BLE      Ambulation/Gait   Ambulation Distance (Feet)  316 Feet    Assistive device  None    Gait Pattern  Step-through pattern;Decreased arm swing - right;Trendelenburg;Antalgic    Gait Comments  1 seated rest break due to increased pain to 5/10 in bil hips      Balance   Balance Assessed  Yes      Static Standing  Balance   Static Standing - Balance Support  No upper extremity supported    Static Standing Balance -  Activities   Single Leg Stance - Right Leg;Single Leg Stance - Left Leg    Static Standing - Comment/# of Minutes  <1 sec BLE          Objective measurements completed on examination: See above findings.        PT Education - 12/04/18 1607    Education Details  exam findings, HEP, POC    Person(s) Educated  Patient    Methods  Explanation;Demonstration;Handout    Comprehension  Verbalized understanding       PT Short Term Goals - 12/04/18 1612      PT SHORT TERM GOAL #1   Title  Pt will have improved MMT by 1/2 grade throughout all deficient mm groups in order to reduce pain and improve stair ambulation.     Time  3    Period  Weeks    Status  New    Target Date  12/25/18      PT SHORT TERM GOAL #2   Title  Pt will be able to perform bil SLS for 10 sec or > without UE in order to demo  improved core and functional strength so as to maximize stairs.     Time  3    Period  Weeks    Status  New      PT SHORT TERM GOAL #3   Title  Pt will have 145ft improvement in 3MWT without a rest break and with 2/10 LBP/bil hip pain or < to demo improved functional strength and maximize her HH mobility.     Time  3    Period  Weeks    Status  New        PT Long Term Goals - 12/04/18 1613      PT LONG TERM GOAL #1   Title  Pt will have improved MMT by 1 grade throughout all deficient mm groups in order to further reduce LBP and maximize her functional mobility.    Time  6    Period  Weeks    Status  New    Target Date  01/15/19      PT LONG TERM GOAL #2   Title  Pt will report being able to stand for 30 mins or > with 2/10 LBP/bil hip pain or < to allow her to perform light HH chores with greater ease.     Time  6    Period  Weeks    Status  New      PT LONG TERM GOAL #3   Title  Pt will report participating in a regular walking program at least 2 days/week to demo  improved overall function, mobility, and return to PLOF.     Time  6    Period  Weeks    Status  New             Plan - 12/04/18 1609    Clinical Impression Statement  Pt is pleasant 68YO F who presents to OPPT with c/o subacute LBP of insidious onset. Pt currently presents with deficits in lumbar ROM, proximal hip strength, balance, gait, soft tissue restrictions, joint mobility, and increased pain. Pt reporting referred pain into her lateral hips in BLE which is likely gluteal mm fatigue/weakness and/or referred pain from mm restrictions as she has no s/s of radicular pain or other neurological issues. Pt needs skilled PT intervention to address these impairments in order to reduce her pain and promote return to PLOF.     Personal Factors and Comorbidities  Age;Comorbidity 3+    Comorbidities  see medical history above    Examination-Activity Limitations  Stairs;Stand    Examination-Participation Restrictions  Community Activity;Cleaning;Laundry    Stability/Clinical Decision Making  Stable/Uncomplicated    Clinical Decision Making  Low    Rehab Potential  Fair    PT Frequency  2x / week    PT Duration  6 weeks    PT Treatment/Interventions  ADLs/Self Care Home Management;Aquatic Therapy;Cryotherapy;Electrical Stimulation;Moist Heat;Traction;Ultrasound;DME Instruction;Gait training;Stair training;Functional mobility training;Therapeutic activities;Therapeutic exercise;Balance training;Neuromuscular re-education;Patient/family education;Orthotic Fit/Training;Manual techniques;Scar mobilization;Passive range of motion;Dry needling;Energy conservation;Taping;Spinal Manipulations;Joint Manipulations    PT Next Visit Plan  review goals, administer FOTO, begin lumbar mobility, functional/BLE/core strength, balance and stair training    PT Home Exercise Plan  eval: LTRs, SKTC    Consulted and Agree with Plan of Care  Patient       Patient will benefit from skilled therapeutic intervention  in order to improve the following deficits and impairments:  Abnormal gait, Decreased activity tolerance, Decreased balance, Decreased endurance, Decreased mobility, Decreased range of motion, Decreased strength, Difficulty walking, Hypomobility, Increased fascial  restricitons, Increased muscle spasms, Impaired flexibility, Improper body mechanics, Postural dysfunction, Pain  Visit Diagnosis: Acute bilateral low back pain without sciatica - Plan: PT plan of care cert/re-cert  Muscle weakness (generalized) - Plan: PT plan of care cert/re-cert  Other symptoms and signs involving the musculoskeletal system - Plan: PT plan of care cert/re-cert     Problem List Patient Active Problem List   Diagnosis Date Noted  . Internal carotid artery stenosis, left   . Neural foraminal stenosis of lumbar spine 05/16/2018  . Genetic testing 05/24/2017  . HLD (hyperlipidemia) 02/07/2017  . Normocytic normochromic anemia 01/10/2017  . GERD (gastroesophageal reflux disease) 09/27/2016  . Back pain 09/27/2016  . Anxiety 09/27/2016  . Allergy 09/27/2016  . Radiation proctitis 12/23/2015  . Anal cancer ([place]) 11/19/2015  . History of ovarian cancer 08/03/2015  . Prolapse of vaginal vault after hysterectomy 07/20/2015  . Migraines 05/20/2015  . Port-A-Cath in place 01/13/2015  . Epithelial ovarian cancer, FIGO stage IIIA ([place]) 07/16/2014  . Abdominal carcinomatosis ([place]) 06/18/2014  . Bipolar 1 disorder ([place]) 12/27/2011  . Kidney stones 12/27/2011  . IBS (irritable colon syndrome) 12/27/2011  . Depression 11/07/2011        Geraldine Solar PT, [place] [address] [place], [place], [address] Phone: [phone]   Fax:  [phone]  Name: Abigail Wagner MRN: [id number] Date of Birth: [birth year]

## 2018-12-05 ENCOUNTER — Other Ambulatory Visit (INDEPENDENT_AMBULATORY_CARE_PROVIDER_SITE_OTHER): Payer: Self-pay | Admitting: Surgery

## 2018-12-06 ENCOUNTER — Other Ambulatory Visit: Payer: Self-pay | Admitting: Family Medicine

## 2018-12-06 ENCOUNTER — Telehealth (INDEPENDENT_AMBULATORY_CARE_PROVIDER_SITE_OTHER): Payer: Self-pay | Admitting: Orthopaedic Surgery

## 2018-12-06 NOTE — Telephone Encounter (Signed)
I called patient and advised, Dr. Lorin Mercy sent in med refill.

## 2018-12-06 NOTE — Telephone Encounter (Signed)
[place] patient

## 2018-12-06 NOTE — Telephone Encounter (Signed)
Ok for refill? 

## 2018-12-06 NOTE — Telephone Encounter (Signed)
Refill request has been sent to Dr. Lorin Mercy.

## 2018-12-06 NOTE — Telephone Encounter (Signed)
Pt called asking for a refill on tramadol.

## 2018-12-09 ENCOUNTER — Encounter (HOSPITAL_COMMUNITY): Payer: Self-pay

## 2018-12-10 ENCOUNTER — Ambulatory Visit (HOSPITAL_COMMUNITY): Payer: Medicare Other

## 2018-12-10 ENCOUNTER — Encounter (HOSPITAL_COMMUNITY): Payer: Self-pay

## 2018-12-10 ENCOUNTER — Other Ambulatory Visit: Payer: Self-pay

## 2018-12-10 DIAGNOSIS — M545 Low back pain, unspecified: Secondary | ICD-10-CM

## 2018-12-10 DIAGNOSIS — R29898 Other symptoms and signs involving the musculoskeletal system: Secondary | ICD-10-CM | POA: Diagnosis not present

## 2018-12-10 DIAGNOSIS — M6281 Muscle weakness (generalized): Secondary | ICD-10-CM | POA: Diagnosis not present

## 2018-12-10 NOTE — Patient Instructions (Signed)
Knee to Chest (Flexion)    Pull knee toward chest. Feel stretch in lower back or buttock area. Breathing deeply, Hold 30 seconds. Repeat with other knee. Repeat 3 times. Do 1-2 sessions per day.  http://gt2.exer.us/226   Copyright  VHI. All rights reserved.   Lower Trunk Rotation Stretch    Keeping back flat and feet together, rotate knees to left side. Hold 10 seconds. Repeat 5 times per set. Do 1-2 sets per session. Do 2 sessions per day.  http://orth.exer.us/123   Copyright  VHI. All rights reserved.

## 2018-12-10 NOTE — Therapy (Signed)
[name], [place], [address] Phone: [phone]   Fax:  [phone]  Physical Therapy Treatment  Patient Details  Name: Abigail Wagner MRN: [id number] Date of Birth: 1951/07/03 Referring Provider (PT): Marybelle Killings, MD   Encounter Date: 12/10/2018  PT End of Session - 12/10/18 1442    Visit Number  2    Number of Visits  13    Date for PT Re-Evaluation  01/15/19   Minireassess 12/25/18   Authorization Type  [employer] Medicare (Secondary: UHC Medicare)    Authorization Time Period  12/04/18 to 01/15/19    Authorization - Visit Number  2    Authorization - Number of Visits  10    PT Start Time  1440    PT Stop Time  5681    PT Time Calculation (min)  38 min    Activity Tolerance  Patient tolerated treatment well;No increased pain    Behavior During Therapy  WFL for tasks assessed/performed       Past Medical History:  Diagnosis Date  . Allergy   . Anal carcinoma [facility]) oncologist-  dr Whitney Muse ([facility])   dx 12/ 2016 [facility] --  chemo and radiation- Dr. Marcello Moores  . Anemia    due to her cancer  . Anxiety    Dr. Harrington Challenger at [place] (psychiatrist).    . Carcinoma of ovary, stage 3 [facility]) oncologist-  dr gehrig/  dr Doreene Eland (cancer center in [place])--  no recurrency   dx 09/ 2015  Stage IIIB  papillary ovarian carcinoma  s/p  omentectomy and BSO &  chemotherapy (completed 12-07-2014)  . Chemotherapy induced nausea and vomiting 10/23/2014  . Chronic kidney disease    stones  . DDD (degenerative disc disease), lumbosacral   . GERD (gastroesophageal reflux disease)   . Headache(784.0)   . History of acute respiratory failure    05-30-2009  drug overdose-- (intubated for 2 days)  and aspiration pneumonia  . History of adenomatous polyp of colon 10/07/2015   tubular adenoma high grade dysplasia  . History of herpes genitalis   . History of kidney stones   . History of ovarian cancer 08/03/2015  . History of  suicide attempt    per documentation in epic  05-30-2009  overdose benaodiazepine  . HTN (hypertension)    takes Metoprolol daily  . Hyperlipidemia   . Hypertension 09/27/2016  . IBS (irritable bowel syndrome)    takes Bentyl daily  . Internal carotid artery stenosis, left    <50%, ulcerative plaque at carotid bulb.   . Major depression   . Mood disorder ([place])   . OA (osteoarthritis)    both hips  . Ovarian cancer ([place])    2015-TAH/BSO  . Prolapse of vaginal vault after hysterectomy 07/20/2015  . Rectovaginal fistula 08/05/2015  . Sigmoid diverticulosis   . Smokers' cough [facility])     Past Surgical History:  Procedure Laterality Date  . BREAST ENHANCEMENT SURGERY  1986  . [place]  . COLONOSCOPY N/A 10/07/2015   Procedure: COLONOSCOPY;  Surgeon: Rogene Houston, MD;  [facility];  Service: Endoscopy;  Laterality: N/A;  7:30  . EXPLORATORY LAPAROTOMY/ OMENTECTOMY/  BILATERAL SALPINGOOPHORECTOMY/  [facility] PLACEMENT  07-03-2014   [facility]  . KNEE ARTHROSCOPY Left 09-22-2004  . LAPAROSCOPY N/A 06/02/2014   Procedure: DIAGNOSTIC LAPAROSCOPY, OMENTAL BIOPSY, RIGHT OVARY BIOPSY, LYSIS OF ADHESIONS;  Surgeon: Fanny Skates, MD;  Location:  [place] OR;  Service: General;  Laterality: N/A;  . MUCOSAL ADVANCEMENT FLAP N/A 06/15/2016   Procedure: EXCISION RECTOVAGINAOL FISTULA WITH MUCOSAL ADVANCEMENT FLAP;  Surgeon: Leighton Ruff, MD;  Location: [place];  Service: General;  Laterality: N/A;  . PLACEMENT OF SETON N/A 09/09/2015   Procedure: PLACEMENT OF SETON;  Surgeon: Leighton Ruff, MD;  Location: [place];  Service: General;  Laterality: N/A;  . PORT-A-CATH REMOVAL Right 08/17/2014   Procedure: REMOVAL INTRAPERITONEAL CHEMO PORT;  Surgeon: Fanny Skates, MD;  Location: [place];  Service: General;  Laterality: Right;  . PORTACATH PLACEMENT Right 08/17/2014   Procedure:  PLACE NEW PORT A CATH;  Surgeon: Fanny Skates, MD;  Location: [place];  Service: General;  Laterality: Right;  . RECTAL BIOPSY N/A 09/09/2015   Procedure: BIOPSY OF RECTOVAGINAL MASS;  Surgeon: Leighton Ruff, MD;  Location: [place];  Service: General;  Laterality: N/A;  . TUBAL LIGATION  YRS AGO  . VAGINAL HYSTERECTOMY  1981   fibroids    There were no vitals filed for this visit.  Subjective Assessment - 12/10/18 1441    Subjective  Pt stated she is feeling good today.  Has lost her HEP printout so unable to begin that, has continues with HEP given to her from chiropractor.    Patient Stated Goals  get better    Currently in Pain?  No/denies                       [facility] Adult PT Treatment/Exercise - 12/10/18 0001      Exercises   Exercises  Lumbar      Lumbar Exercises: Stretches   Active Hamstring Stretch  2 reps;30 seconds    Active Hamstring Stretch Limitations  supine, hands behind knee    Single Knee to Chest Stretch  2 reps;30 seconds    Lower Trunk Rotation  5 reps;10 seconds      Lumbar Exercises: Standing   Other Standing Lumbar Exercises  SLS 3x each      Lumbar Exercises: Supine   Bent Knee Raise  10 reps;3 seconds    Bridge  10 reps      Lumbar Exercises: Sidelying   Clam  Both;10 reps;3 seconds    Clam Limitations  RTB             PT Education - 12/10/18 1458    Education Details  Reviewed goals, assured compliance iwht HEP, required some cueing for form with exercises, FOTO complete with 63% limitation    Person(s) Educated  Patient    Methods  Demonstration;Explanation    Comprehension  Verbalized understanding;Returned demonstration       PT Short Term Goals - 12/04/18 1612      PT SHORT TERM GOAL #1   Title  Pt will have improved MMT by 1/2 grade throughout all deficient mm groups in order to reduce pain and improve stair ambulation.     Time  3    Period  Weeks    Status  New    Target Date  12/25/18      PT SHORT TERM  GOAL #2   Title  Pt will be able to perform bil SLS for 10 sec or > without UE in order to demo improved core and functional strength so as to maximize stairs.     Time  3    Period  Weeks    Status  New      PT SHORT TERM GOAL #3   Title  Pt will have 160ft improvement in 3MWT without a rest break and with 2/10 LBP/bil hip pain or < to demo improved functional strength and maximize her HH mobility.     Time  3    Period  Weeks    Status  New        PT Long Term Goals - 12/04/18 1613      PT LONG TERM GOAL #1   Title  Pt will have improved MMT by 1 grade throughout all deficient mm groups in order to further reduce LBP and maximize her functional mobility.    Time  6    Period  Weeks    Status  New    Target Date  01/15/19      PT LONG TERM GOAL #2   Title  Pt will report being able to stand for 30 mins or > with 2/10 LBP/bil hip pain or < to allow her to perform light HH chores with greater ease.     Time  6    Period  Weeks    Status  New      PT LONG TERM GOAL #3   Title  Pt will report participating in a regular walking program at least 2 days/week to demo improved overall function, mobility, and return to PLOF.     Time  6    Period  Weeks    Status  New            Plan - 12/10/18 1526    Clinical Impression Statement  Reviewed goals and discussed importance of compliance iwht HEP.  Pt stated she lost her printout and has continued exercises given to her by chiropractor.  Another printout given and reviewed importance of compliance.  Session focus on lumbar mobility and proximal strengthening, pt able to complete all exericses with some cueing for stability and mechanics.  No reports of pain through session.  Reviewed body mechanics getting in and out of bed.      Personal Factors and Comorbidities  Age;Comorbidity 3+    Comorbidities  see medical history above    Examination-Activity Limitations  Stairs;Stand    Examination-Participation Restrictions  Community  Activity;Cleaning;Laundry    Stability/Clinical Decision Making  Stable/Uncomplicated    Rehab Potential  Fair    PT Frequency  2x / week    PT Duration  6 weeks    PT Treatment/Interventions  ADLs/Self Care Home Management;Aquatic Therapy;Cryotherapy;Electrical Stimulation;Moist Heat;Traction;Ultrasound;DME Instruction;Gait training;Stair training;Functional mobility training;Therapeutic activities;Therapeutic exercise;Balance training;Neuromuscular re-education;Patient/family education;Orthotic Fit/Training;Manual techniques;Scar mobilization;Passive range of motion;Dry needling;Energy conservation;Taping;Spinal Manipulations;Joint Manipulations    PT Next Visit Plan  Continue lumbar mobility, functional/BLE/core strength, balance and stair training    PT Home Exercise Plan  eval: LTRs, SKTC       Patient will benefit from skilled therapeutic intervention in order to improve the following deficits and impairments:  Abnormal gait, Decreased activity tolerance, Decreased balance, Decreased endurance, Decreased mobility, Decreased range of motion, Decreased strength, Difficulty walking, Hypomobility, Increased fascial restricitons, Increased muscle spasms, Impaired flexibility, Improper body mechanics, Postural dysfunction, Pain  Visit Diagnosis: Acute bilateral low back pain without sciatica  Muscle weakness (generalized)  Other symptoms and signs involving the musculoskeletal system     Problem List Patient Active Problem List   Diagnosis Date Noted  . Internal carotid artery stenosis, left   . Neural foraminal stenosis of lumbar spine 05/16/2018  . Genetic testing  05/24/2017  . HLD (hyperlipidemia) 02/07/2017  . Normocytic normochromic anemia 01/10/2017  . GERD (gastroesophageal reflux disease) 09/27/2016  . Back pain 09/27/2016  . Anxiety 09/27/2016  . Allergy 09/27/2016  . Radiation proctitis 12/23/2015  . Anal cancer ([place]) 11/19/2015  . History of ovarian cancer 08/03/2015   . Prolapse of vaginal vault after hysterectomy 07/20/2015  . Migraines 05/20/2015  . Port-A-Cath in place 01/13/2015  . Epithelial ovarian cancer, FIGO stage IIIA ([place]) 07/16/2014  . Abdominal carcinomatosis ([place]) 06/18/2014  . Bipolar 1 disorder ([place]) 12/27/2011  . Kidney stones 12/27/2011  . IBS (irritable colon syndrome) 12/27/2011  . Depression 11/07/2011   Ihor Austin, [place]; [place]  Aldona Lento 12/10/2018, 3:32 PM  [place] [address] [place], [place], [address] Phone: [phone]   Fax:  [phone]  Name: VERTIE DIBBERN MRN: [id number] Date of Birth: [birth year]

## 2018-12-11 ENCOUNTER — Encounter (HOSPITAL_COMMUNITY): Payer: Self-pay

## 2018-12-13 ENCOUNTER — Ambulatory Visit (HOSPITAL_COMMUNITY): Payer: Medicare Other

## 2018-12-13 ENCOUNTER — Telehealth (HOSPITAL_COMMUNITY): Payer: Self-pay

## 2018-12-13 NOTE — Telephone Encounter (Signed)
Called pt. and discussed OPPT dept closing for 2 weeks minimal to reduce spread of COVID-19 in community.  Discussed compliance with HEP and pt does wish to receive a weekly call.    [address], [place]; CB[facility] [phone]

## 2018-12-17 ENCOUNTER — Ambulatory Visit (HOSPITAL_COMMUNITY): Payer: Medicare Other

## 2018-12-19 ENCOUNTER — Ambulatory Visit (HOSPITAL_COMMUNITY): Payer: Medicare Other

## 2018-12-19 ENCOUNTER — Telehealth (HOSPITAL_COMMUNITY): Payer: Self-pay | Admitting: Physical Therapy

## 2018-12-19 NOTE — Telephone Encounter (Signed)
Therapist called to check in on patient. There was no answer and therapist left a message that if patient had any questions or concerns to call back and provided phone number.  Clarene Critchley PT, DPT 12:29 PM, 12/19/18 [phone]

## 2018-12-24 ENCOUNTER — Encounter (HOSPITAL_COMMUNITY): Payer: Self-pay

## 2018-12-25 ENCOUNTER — Telehealth (HOSPITAL_COMMUNITY): Payer: Self-pay

## 2018-12-25 NOTE — Telephone Encounter (Signed)
Left voicemail informing pt that our clinic is cancelling all appointments until further notice due to COVID-19 in order to ensure pt and staff safety during this time. Informed pt that we are working to offer telehealth sessions and asked that she call back and let us know if she is interested in this. Informed pt that we would continue to call her weekly to check in on her HEP and her overall status in order to answer any questions she might have. Left front office number on message for pt to return call about telehealth and/or any other questions she may have.  Geraldine Solar PT, DPT

## 2018-12-26 ENCOUNTER — Telehealth (HOSPITAL_COMMUNITY): Payer: Self-pay

## 2018-12-26 NOTE — Telephone Encounter (Signed)
Pt returned phone call from yesterday. Pt unable to participate in telehealth sessions but does want weekly phone calls to check in on her exercises and progression. No changes made to HEP this date as pt was pleased with everything thus far. Will continue to call pt once a week to check in on her status/progress as requested.   Geraldine Solar PT, DPT

## 2018-12-27 ENCOUNTER — Encounter (HOSPITAL_COMMUNITY): Payer: Self-pay

## 2018-12-30 ENCOUNTER — Other Ambulatory Visit (INDEPENDENT_AMBULATORY_CARE_PROVIDER_SITE_OTHER): Payer: Self-pay | Admitting: Orthopaedic Surgery

## 2018-12-30 NOTE — Telephone Encounter (Signed)
Please advise 

## 2018-12-31 ENCOUNTER — Ambulatory Visit (INDEPENDENT_AMBULATORY_CARE_PROVIDER_SITE_OTHER): Payer: Medicare Other | Admitting: Family Medicine

## 2018-12-31 ENCOUNTER — Encounter: Payer: Self-pay | Admitting: Family Medicine

## 2018-12-31 ENCOUNTER — Other Ambulatory Visit: Payer: Self-pay

## 2018-12-31 ENCOUNTER — Ambulatory Visit (HOSPITAL_COMMUNITY)
Admission: RE | Admit: 2018-12-31 | Discharge: 2018-12-31 | Disposition: A | Discharge status: Home / Self Care | Payer: Medicare Other | Source: Ambulatory Visit | Attending: Family Medicine | Admitting: Family Medicine

## 2018-12-31 ENCOUNTER — Encounter (HOSPITAL_COMMUNITY): Payer: Self-pay

## 2018-12-31 VITALS — BP 140/80 | HR 80 | Temp 98.4°F | Resp 14 | Ht 65.0 in | Wt 176.0 lb

## 2018-12-31 DIAGNOSIS — M7989 Other specified soft tissue disorders: Secondary | ICD-10-CM | POA: Diagnosis not present

## 2018-12-31 DIAGNOSIS — M79604 Pain in right leg: Secondary | ICD-10-CM

## 2018-12-31 DIAGNOSIS — M79671 Pain in right foot: Secondary | ICD-10-CM | POA: Diagnosis not present

## 2018-12-31 DIAGNOSIS — R600 Localized edema: Secondary | ICD-10-CM | POA: Diagnosis not present

## 2018-12-31 DIAGNOSIS — R2241 Localized swelling, mass and lump, right lower limb: Secondary | ICD-10-CM | POA: Diagnosis not present

## 2018-12-31 NOTE — Telephone Encounter (Signed)
Called to pharmacy 

## 2018-12-31 NOTE — Progress Notes (Signed)
Subjective:    Patient ID: Abigail Wagner, female    DOB: 10-17-1950, 68 y.o.   MRN: [id number]  HPI Patient is a very pleasant 68 year old Caucasian female who has a very complicated past medical history who has a history of stage III ovarian cancer with metastasis to the omentum.  This was surgically resected with a bilateral salpingo-oophorectomy.  Patient had previously had a partial hysterectomy.  She is currently followed with periodic imaging as well as a Ca125 at the [facility].  She also has a history of stage II anal cancer.  She sees a Dr. Marcello Moores who performs periodic endoscopies usually every 6 months according to the patient.    Approximately 2 weeks ago patient started developing pain in her right foot.  Gradually the foot became swollen.  She has +1 pitting edema in the right foot and the right ankle extending up the anterior right shin to the mid shin region.  This is asymmetric when compared to the left side with there is no edema.  She also has some mild redness on the dorsum and lateral aspect of the ankle.  She also has some tenderness to palpation just anterior and inferior to the lateral malleolus.  She has very little pain with range of motion in the ankle.  She has normal flexion and extension and inversion and eversion.  However the foot itself is aching and throbbing and pain constantly.  Range of motion does not seem to trigger the pain.  Resting does not alleviate the pain.  The swelling is gradually getting worse. Past Surgical History:  Procedure Laterality Date  . BREAST ENHANCEMENT SURGERY  1986  . [place]  . COLONOSCOPY N/A 10/07/2015   Procedure: COLONOSCOPY;  Surgeon: Rogene Houston, MD;  [facility];  Service: Endoscopy;  Laterality: N/A;  7:30  . EXPLORATORY LAPAROTOMY/ OMENTECTOMY/  BILATERAL SALPINGOOPHORECTOMY/  [facility] PLACEMENT  07-03-2014   [facility]  . KNEE ARTHROSCOPY Left 09-22-2004  . LAPAROSCOPY N/A 06/02/2014    Procedure: DIAGNOSTIC LAPAROSCOPY, OMENTAL BIOPSY, RIGHT OVARY BIOPSY, LYSIS OF ADHESIONS;  Surgeon: Fanny Skates, MD;  Location: [place];  Service: General;  Laterality: N/A;  . MUCOSAL ADVANCEMENT FLAP N/A 06/15/2016   Procedure: EXCISION RECTOVAGINAOL FISTULA WITH MUCOSAL ADVANCEMENT FLAP;  Surgeon: Leighton Ruff, MD;  Location: [place];  Service: General;  Laterality: N/A;  . PLACEMENT OF SETON N/A 09/09/2015   Procedure: PLACEMENT OF SETON;  Surgeon: Leighton Ruff, MD;  Location: [place];  Service: General;  Laterality: N/A;  . PORT-A-CATH REMOVAL Right 08/17/2014   Procedure: REMOVAL INTRAPERITONEAL CHEMO PORT;  Surgeon: Fanny Skates, MD;  Location: [place];  Service: General;  Laterality: Right;  . PORTACATH PLACEMENT Right 08/17/2014   Procedure:  PLACE NEW PORT A CATH;  Surgeon: Fanny Skates, MD;  Location: [place];  Service: General;  Laterality: Right;  . RECTAL BIOPSY N/A 09/09/2015   Procedure: BIOPSY OF RECTOVAGINAL MASS;  Surgeon: Leighton Ruff, MD;  Location: [place];  Service: General;  Laterality: N/A;  . TUBAL LIGATION  YRS AGO  . VAGINAL HYSTERECTOMY  1981   fibroids   Current Outpatient Medications on File Prior to Visit  Medication Sig Dispense Refill  . b complex vitamins tablet Take 1 tablet by mouth daily.    . divalproex (DEPAKOTE ER) 500 MG 24 hr tablet Take 1 tablet (500 mg total) by mouth daily. 90 tablet  3  . escitalopram (LEXAPRO) 20 MG tablet Take 1 tablet (20 mg total) by mouth 2 (two) times daily. 90 tablet 3  . ketoconazole (NIZORAL) 2 % cream Apply 1 application topically daily. prn    . KRILL OIL PO Take 350 mg by mouth daily.    . lansoprazole (PREVACID) 15 MG capsule Take 15 mg by mouth as needed.     Marland Kitchen LORazepam (ATIVAN) 1 MG tablet Take 1 tablet (1 mg total) by mouth 3 (three) times daily as needed for anxiety. 90 tablet 3  . losartan (COZAAR) 50 MG  tablet Take 1 tablet (50 mg total) by mouth daily. 90 tablet 3  . meloxicam (MOBIC) 15 MG tablet TAKE 1 TABLET BY MOUTH EVERY DAY 30 tablet 2  . Multiple Vitamin (MULTIVITAMIN) tablet Take 1 tablet by mouth daily.    . rosuvastatin (CRESTOR) 20 MG tablet TAKE 1 TABLET BY MOUTH EVERY DAY 90 tablet 1  . traMADol (ULTRAM) 50 MG tablet TAKE 1 TABLET BY MOUTH EVERY 12 HOURS AS NEEDED 16 tablet 1  . valACYclovir (VALTREX) 1000 MG tablet Take 1 tablet (1,000 mg total) by mouth daily as needed. 5 tablet 11   No current facility-administered medications on file prior to visit.    No Known Allergies Social History   Socioeconomic History  . Marital status: Married    Spouse name: Coralyn Mark  . Number of children: 3  . Years of education: 44  . Highest education level: Not on file  Occupational History  . Occupation: retire    Comment: Animal nutritionist  . Financial resource strain: Not on file  . Food insecurity:    Worry: Not on file    Inability: Not on file  . Transportation needs:    Medical: Not on file    Non-medical: Not on file  Tobacco Use  . Smoking status: Current Every Day Smoker    Packs/day: 0.50    Years: 43.00    Pack years: 21.50    Types: Cigarettes  . Smokeless tobacco: Never Used  Substance and Sexual Activity  . Alcohol use: No  . Drug use: No  . Sexual activity: Not Currently    Birth control/protection: Surgical  Lifestyle  . Physical activity:    Days per week: Not on file    Minutes per session: Not on file  . Stress: Not on file  Relationships  . Social connections:    Talks on phone: Not on file    Gets together: Not on file    Attends religious service: Not on file    Active member of club or organization: Not on file    Attends meetings of clubs or organizations: Not on file    Relationship status: Not on file  . Intimate partner violence:    Fear of current or ex partner: Not on file    Emotionally abused: Not on file    Physically  abused: Not on file    Forced sexual activity: Not on file  Other Topics Concern  . Not on file  Social History Narrative   Lives at home with Coralyn Mark   Retired [employer]   Family History  Problem Relation Age of Onset  . Bipolar disorder Mother   . Anxiety disorder Mother   . Dementia Mother   . Depression Mother   . Mental illness Mother   . Vision loss Mother   . Alzheimer's disease Mother   . Alcohol abuse Paternal Uncle   .  Bipolar disorder Maternal Grandmother   . Dementia Maternal Grandmother   . Alzheimer's disease Maternal Grandmother   . Alcohol abuse Paternal Uncle   . Stroke Brother   . Deep vein thrombosis Son   . Heart disease Father   . Hyperlipidemia Father   . Hypertension Father   . Stroke Father   . Vision loss Father   . Atrial fibrillation Sister   . Heart disease Brother   . Heart disease Brother   . ADD / ADHD Neg Hx   . Drug abuse Neg Hx   . OCD Neg Hx   . Paranoid behavior Neg Hx   . Schizophrenia Neg Hx   . Seizures Neg Hx   . Sexual abuse Neg Hx   . Physical abuse Neg Hx       Review of Systems  All other systems reviewed and are negative.      Objective:   Physical Exam Vitals signs reviewed.  Constitutional:      General: She is not in acute distress.    Appearance: She is well-developed. She is not diaphoretic.  HENT:     Mouth/Throat:     Pharynx: No oropharyngeal exudate.  Eyes:     General: No scleral icterus.       Right eye: No discharge.        Left eye: No discharge.     Conjunctiva/sclera: Conjunctivae normal.     Pupils: Pupils are equal, round, and reactive to light.  Neck:     Thyroid: No thyromegaly.     Vascular: No JVD.     Trachea: No tracheal deviation.  Cardiovascular:     Rate and Rhythm: Normal rate and regular rhythm.     Pulses:          Carotid pulses are on the left side with bruit.    Heart sounds: Normal heart sounds. No murmur. No friction rub. No gallop.   Pulmonary:     Effort: Pulmonary  effort is normal. No respiratory distress.     Breath sounds: Normal breath sounds. No stridor. No wheezing or rales.  Chest:     Chest wall: No tenderness.  Musculoskeletal:     Right lower leg: Edema present.     Left lower leg: No edema.  Skin:    General: Skin is warm.     Findings: Erythema present.  Neurological:     Mental Status: She is alert.     Motor: No abnormal muscle tone.           Assessment & Plan:  Right leg swelling - Plan: US Venous Img Lower Unilateral Right  Given her cancer history, I am concerned about a DVT.  I will empirically start the patient on Xarelto 15 mg p.o. twice daily and obtain urgent ultrasound of the right lower extremity to rule out DVT.  If there is a DVT present, she will need 21 days of Xarelto 15 mg twice daily followed by 20 mg a day thereafter.  Given the fact that this would be an unprovoked DVT, we may also need to institute a hypercoagulable work-up or evaluate for any evidence of cancer recurrence to determine the need for Lovenox.  If the ultrasound is negative for DVT, also on the differential diagnosis would be some type of occult fracture of the dorsum of the right foot.  Therefore I will obtain an x-ray of the right foot as well as the right tibia and fibula if the ultrasound  is negative for a DVT.  I would also evaluate the patient for gout by checking a uric acid level as this could be a smoldering gout exacerbation in the right ankle although I feel that this is less likely.  The patient will not return for the uric acid and lab work or the x-ray unless the ultrasound is negative for DVT.

## 2018-12-31 NOTE — Telephone Encounter (Signed)
OK refill thank  You

## 2019-01-01 ENCOUNTER — Other Ambulatory Visit: Payer: Self-pay

## 2019-01-01 ENCOUNTER — Other Ambulatory Visit: Payer: Medicare Other

## 2019-01-01 ENCOUNTER — Telehealth (HOSPITAL_COMMUNITY): Payer: Self-pay

## 2019-01-01 DIAGNOSIS — M79604 Pain in right leg: Secondary | ICD-10-CM | POA: Diagnosis not present

## 2019-01-01 DIAGNOSIS — M7989 Other specified soft tissue disorders: Secondary | ICD-10-CM | POA: Diagnosis not present

## 2019-01-01 NOTE — Telephone Encounter (Signed)
Pt called to see if our office had reopened yet. Pt states her foot is swollen and she is doing exercises when lying in the bed and she is eager to getting back in the office. Advised pt we are not open yet and we are working on [employer] interested

## 2019-01-02 ENCOUNTER — Other Ambulatory Visit: Payer: Self-pay | Admitting: Family Medicine

## 2019-01-02 ENCOUNTER — Encounter (HOSPITAL_COMMUNITY): Payer: Self-pay

## 2019-01-02 LAB — COMPLETE METABOLIC PANEL WITH GFR
AG Ratio: 1.8 (calc) (ref 1.0–2.5)
ALT: 22 U/L (ref 6–29)
AST: 36 U/L — ABNORMAL HIGH (ref 10–35)
Albumin: 4.2 g/dL (ref 3.6–5.1)
Alkaline phosphatase (APISO): 49 U/L (ref 37–153)
BUN: 18 mg/dL (ref 7–25)
CO2: 26 mmol/L (ref 20–32)
Calcium: 9.3 mg/dL (ref 8.6–10.4)
Chloride: 107 mmol/L (ref 98–110)
Creat: 0.77 mg/dL (ref 0.50–0.99)
GFR, Est African American: 92 mL/min/{1.73_m2} (ref >=60)
GFR, Est Non African American: 79 mL/min/{1.73_m2} (ref >=60)
Globulin: 2.3 g/dL (calc) (ref 1.9–3.7)
Glucose, Bld: 108 mg/dL — ABNORMAL HIGH (ref 65–99)
Potassium: 4.7 mmol/L (ref 3.5–5.3)
Sodium: 143 mmol/L (ref 135–146)
Total Bilirubin: 0.2 mg/dL (ref 0.2–1.2)
Total Protein: 6.5 g/dL (ref 6.1–8.1)

## 2019-01-02 LAB — CBC WITH DIFFERENTIAL/PLATELET
Absolute Monocytes: 498 cells/uL (ref 200–950)
Basophils Absolute: 30 cells/uL (ref 0–200)
Basophils Relative: 0.8 %
Eosinophils Absolute: 239 cells/uL (ref 15–500)
Eosinophils Relative: 6.3 %
HCT: 38.4 % (ref 35.0–45.0)
Hemoglobin: 13.3 g/dL (ref 11.7–15.5)
Lymphs Abs: 992 cells/uL (ref 850–3900)
MCH: 32 pg (ref 27.0–33.0)
MCHC: 34.6 g/dL (ref 32.0–36.0)
MCV: 92.5 fL (ref 80.0–100.0)
MPV: 10.8 fL (ref 7.5–12.5)
Monocytes Relative: 13.1 %
Neutro Abs: 2041 cells/uL (ref 1500–7800)
Neutrophils Relative %: 53.7 %
Platelets: 175 10*3/uL (ref 140–400)
RBC: 4.15 10*6/uL (ref 3.80–5.10)
RDW: 13.7 % (ref 11.0–15.0)
Total Lymphocyte: 26.1 %
WBC: 3.8 10*3/uL (ref 3.8–10.8)

## 2019-01-02 LAB — URIC ACID: Uric Acid, Serum: <1.5 mg/dL — ABNORMAL LOW (ref 2.5–7.0)

## 2019-01-02 MED ORDER — PREDNISONE 20 MG PO TABS: ORAL_TABLET | ORAL | 0 refills | Status: DC

## 2019-01-03 ENCOUNTER — Encounter: Payer: Self-pay | Admitting: Family Medicine

## 2019-01-06 ENCOUNTER — Telehealth (HOSPITAL_COMMUNITY): Payer: Self-pay

## 2019-01-06 NOTE — Telephone Encounter (Signed)
Weekly phone call made to pt to check-in on her HEP and her current status. Pt stating that her HEP was going well so far and no changes to the current program were desired. PT educated pt that she could call our clinic with any questions or concerns but otherwise, we would check in with her again next week.  Geraldine Solar PT, DPT

## 2019-01-07 ENCOUNTER — Other Ambulatory Visit: Payer: Self-pay | Admitting: *Deleted

## 2019-01-07 ENCOUNTER — Encounter (HOSPITAL_COMMUNITY): Payer: Self-pay

## 2019-01-07 MED ORDER — MELOXICAM 15 MG PO TABS: 15.0000 mg | ORAL_TABLET | Freq: Every day | ORAL | 2 refills | Status: DC

## 2019-01-08 ENCOUNTER — Other Ambulatory Visit: Payer: Self-pay | Admitting: Family Medicine

## 2019-01-09 ENCOUNTER — Encounter (HOSPITAL_COMMUNITY): Payer: Self-pay

## 2019-01-13 ENCOUNTER — Encounter (HOSPITAL_COMMUNITY): Payer: Self-pay

## 2019-01-15 ENCOUNTER — Telehealth (HOSPITAL_COMMUNITY): Payer: Self-pay

## 2019-01-15 ENCOUNTER — Encounter (HOSPITAL_COMMUNITY): Payer: Self-pay

## 2019-01-15 NOTE — Telephone Encounter (Signed)
I called Abigail Wagner at her home number to check in with her and how her exercises have been going. She states the only one that is challenging is the quadruped exercises where she raises opposite arm and leg. She expressed interest in having additional exercises for her balance as she is still struggling with this. I offered to e-mail new exercises to her and confirmed her e-mail address. She agreed to this and denied any acute concerns at this time.   Kipp Brood, PT, DPT, [facility] Physical Therapist with [facility]  01/15/2019 11:16 AM

## 2019-01-15 NOTE — Patient Instructions (Signed)
Standing Single Leg Stance with Counter Support reps: 5 sets: 2 hold: up to 30 seconds daily: 1  weekly: 7      Exercise image step 1   Exercise image step 2   Exercise image step 3  you may lift hands above counter to reduce support but keep them above to easily be able to grab counter if needed. perform 5 reps on each leg  Setup  Begin in a standing upright position with your hands resting on a counter. Movement  Lift one foot off the ground and maintain your balance in this position. Tip  Make sure to maintain an upright posture and use the counter to help you balance as needed. Standing Tandem Balance with Counter Support reps: 5 sets: 2 hold: up to 30 seconds daily: 1  weekly: 7      Exercise image step 1   Exercise image step 2  you may lift hands above counter to reduce support but keep them above to easily be able to grab counter if needed. perform 5 reps with each leg in the back  Setup  Begin in a standing upright position with your hands resting on a counter. Movement  Place one foot directly behind the other, so that you are in a heel-to-toe position. Maintain your balance in this position. Tip  Make sure to maintain an upright posture and use the counter to help you balance as needed. Disclaimer: This program provides exercises related to your condition that you can perform at home. As there is a risk of injury with any activity, use caution when performing exercises. If you experience any pain or discomfort, discontinue the exercises and contact your health care provider.  Login URL: [email or web address] . Access Code: [email or web address] . Date printed: 01/15/2019 Page 1  Tandem Walking with [employer] Support sets: 3 Steps: 10 daily: 1 weekly: 7   Exercise image step 1   Exercise image step 2  Setup  Begin in a standing upright position with your hand resting on a counter at your side. Movement  Walk forward along the side of the counter, placing the heel of your  foot directly in front of the toes of your other foot.  Tip  Make sure to maintain an upright posture and use the counter to help you balance as needed.

## 2019-01-20 ENCOUNTER — Telehealth: Payer: Medicare Other | Admitting: Family

## 2019-01-20 DIAGNOSIS — M7989 Other specified soft tissue disorders: Secondary | ICD-10-CM

## 2019-01-20 NOTE — Progress Notes (Signed)
Based on what you shared with me, I feel your condition warrants further evaluation and I recommend that you be seen for a face to face office visit.  After reviewing your chart, I believe you need to follow up with your PCP. Your ultrasound and x-ray was negative. Your WBC and uric acid was normal. Since he has already seen you it would be best to follow up with him.    NOTE: If you entered your credit card information for this eVisit, you will not be charged. You may see a "hold" on your card for the $35 but that hold will drop off and you will not have a charge processed.  If you are having a true medical emergency please call 911.  If you need an urgent face to face visit, [facility] has four urgent care centers for your convenience.    PLEASE NOTE: THE INSTACARE LOCATIONS AND URGENT CARE CLINICS DO NOT HAVE THE TESTING FOR CORONAVIRUS COVID19 AVAILABLE.  IF YOU FEEL YOU NEED THIS TEST YOU MUST GO TO A TRIAGE LOCATION AT [place]   [email or web address] to reserve your spot online an avoid wait times  [facility] [address], [address] [place], [place] [address] Modified hours of operation: Monday-Friday, 10 AM to 6 PM  Saturday & Sunday 10 AM to 4 PM *Across the street from [place] (New Address!) [address], [place], [place] [address] *Just off [address], across the road from [place] hours of operation: Monday-Friday, 10 AM to 5 PM  Closed Saturday & Sunday   The following sites will take your insurance:  . [facility] [place] a Provider at this Location  [address] [place], [place] [address] . 10 am to 8 pm Monday-Friday . 12 pm to 8 pm Saturday-Sunday   . [facility] at [place] a Provider at this Location  [address], [address] [place], [place] [address] . 8 am to 8 pm Monday-Friday . 9 am to 6 pm Saturday . 11 am to 6 pm Sunday   . [facility] at [place] Get Driving Directions  [address] [place], [place] [address] . 8 am to 8 pm Monday-Friday . 8 am to 4 pm Saturday-Sunday   Your e-visit answers were reviewed by a board certified advanced clinical practitioner to complete your personal care plan.  Thank you for using e-Visits.

## 2019-01-24 ENCOUNTER — Telehealth (HOSPITAL_COMMUNITY): Payer: Self-pay

## 2019-01-24 NOTE — Telephone Encounter (Signed)
I called and spoke to [name] for her weekly phone call. I called to touch base with her and see how she is feeling since starting her new exercises last week. She reported she is doing well and has been getting out in the yard and walking at the food store with rest breaks. She reports the new exercises are going well. I informed her she can call our office if she has any questions and that we will get in touch with her again next week.   Kipp Brood, PT, DPT, [facility] Physical Therapist with [facility]  01/24/2019 4:12 PM

## 2019-01-31 ENCOUNTER — Other Ambulatory Visit (INDEPENDENT_AMBULATORY_CARE_PROVIDER_SITE_OTHER): Payer: Self-pay | Admitting: Orthopaedic Surgery

## 2019-01-31 ENCOUNTER — Telehealth: Payer: Self-pay | Admitting: Family Medicine

## 2019-01-31 NOTE — Telephone Encounter (Signed)
Pt states that her foot is not any better the swelling in it now is so bad that she cannot even put on a shoe.   Please advise.

## 2019-01-31 NOTE — Telephone Encounter (Signed)
I am sorry. Did it get any better when she was taking prednisone?  I would like to see her back to figure out where to go next.

## 2019-01-31 NOTE — Telephone Encounter (Signed)
Called to pharmacy 

## 2019-01-31 NOTE — Telephone Encounter (Signed)
[place]

## 2019-01-31 NOTE — Telephone Encounter (Signed)
Ok for refill? 

## 2019-02-03 NOTE — Telephone Encounter (Signed)
will discuss at visit tmw

## 2019-02-03 NOTE — Telephone Encounter (Signed)
Spoke with patient and she stated that the swelling did get better while taking prednisone but it then it returned before taking prednisone. I have scheduled patient for a recheck to her foot on tomorrow 02/04/19

## 2019-02-04 ENCOUNTER — Telehealth (HOSPITAL_COMMUNITY): Payer: Self-pay | Admitting: Physical Therapy

## 2019-02-04 ENCOUNTER — Ambulatory Visit (INDEPENDENT_AMBULATORY_CARE_PROVIDER_SITE_OTHER): Payer: Medicare Other | Admitting: Family Medicine

## 2019-02-04 ENCOUNTER — Other Ambulatory Visit: Payer: Self-pay

## 2019-02-04 ENCOUNTER — Encounter: Payer: Self-pay | Admitting: Family Medicine

## 2019-02-04 VITALS — BP 140/82 | HR 88 | Temp 98.4°F | Resp 18 | Ht 65.0 in | Wt 170.0 lb

## 2019-02-04 DIAGNOSIS — I872 Venous insufficiency (chronic) (peripheral): Secondary | ICD-10-CM | POA: Diagnosis not present

## 2019-02-04 MED ORDER — FUROSEMIDE 20 MG PO TABS: 20.0000 mg | ORAL_TABLET | Freq: Every day | ORAL | 3 refills | Status: DC | PRN

## 2019-02-04 NOTE — Telephone Encounter (Signed)
Spoke to patient who is interested in returning into clinic.  Pt with low Covid-19 risk score of 2.  Pt is scheduled for Friday, 5/15.   Teena Irani, PTA/CLT [phone]

## 2019-02-04 NOTE — Progress Notes (Signed)
Subjective:    Patient ID: Abigail Wagner, female    DOB: 11/08/1950, 68 y.o.   MRN: [id number]  HPI  12/31/18 Patient is a very pleasant 68 year old Caucasian female who has a very complicated past medical history who has a history of stage III ovarian cancer with metastasis to the omentum.  This was surgically resected with a bilateral salpingo-oophorectomy.  Patient had previously had a partial hysterectomy.  She is currently followed with periodic imaging as well as a Ca125 at the [facility].  She also has a history of stage II anal cancer.  She sees a Dr. Marcello Moores who performs periodic endoscopies usually every 6 months according to the patient.    Approximately 2 weeks ago patient started developing pain in her right foot.  Gradually the foot became swollen.  She has +1 pitting edema in the right foot and the right ankle extending up the anterior right shin to the mid shin region.  This is asymmetric when compared to the left side with there is no edema.  She also has some mild redness on the dorsum and lateral aspect of the ankle.  She also has some tenderness to palpation just anterior and inferior to the lateral malleolus.  She has very little pain with range of motion in the ankle.  She has normal flexion and extension and inversion and eversion.  However the foot itself is aching and throbbing and pain constantly.  Range of motion does not seem to trigger the pain.  Resting does not alleviate the pain.  The swelling is gradually getting worse.  At that time, my plan was: Given her cancer history, I am concerned about a DVT.  I will empirically start the patient on Xarelto 15 mg p.o. twice daily and obtain urgent ultrasound of the right lower extremity to rule out DVT.  If there is a DVT present, she will need 21 days of Xarelto 15 mg twice daily followed by 20 mg a day thereafter.  Given the fact that this would be an unprovoked DVT, we may also need to institute a hypercoagulable  work-up or evaluate for any evidence of cancer recurrence to determine the need for Lovenox.  If the ultrasound is negative for DVT, also on the differential diagnosis would be some type of occult fracture of the dorsum of the right foot.  Therefore I will obtain an x-ray of the right foot as well as the right tibia and fibula if the ultrasound is negative for a DVT.  I would also evaluate the patient for gout by checking a uric acid level as this could be a smoldering gout exacerbation in the right ankle although I feel that this is less likely.  The patient will not return for the uric acid and lab work or the x-ray unless the ultrasound is negative for DVT.  02/04/19 Patient did not have a DVT.  X-ray of the tibia and fibula in that area were normal.  X-ray of the right foot was normal.  There was no arthropathy or skeletal lesions.  Patient had a uric acid level that was less than 1.5 suggesting against gout however she did see improvement in the discomfort and swelling when she took prednisone.  Patient states that over the last several weeks the swelling has returned.  She states that the swelling is worse when she has been on her feet for long periods of time.  Today on examination she has trace pitting edema in the  right dorsum of the foot and in the right ankle.  She also has numerous small varicose veins and spider veins in the same area suggesting chronic venous insufficiency.  There is no tenderness to palpation.  She has full range of motion in the right ankle and in the right foot without pain.  She reports the discomfort as a 1 on a scale of 1-10 after prolonged standing and walking.  Is usually an aching discomfort related to the swelling.  The swelling definitely gets worse when she is standing for long periods of time.  There is no significant swelling in the left foot or left ankle however the varicose veins are much worse in the right leg than in the left leg Past Surgical History:  Procedure  Laterality Date   [place]   COLONOSCOPY N/A 10/07/2015   Procedure: COLONOSCOPY;  Surgeon: Rogene Houston, MD;  [facility];  Service: Endoscopy;  Laterality: N/A;  7:30   EXPLORATORY LAPAROTOMY/ OMENTECTOMY/  BILATERAL SALPINGOOPHORECTOMY/  PORT-A-CATH PLACEMENT  07-03-2014   [place]   KNEE ARTHROSCOPY Left 09-22-2004   LAPAROSCOPY N/A 06/02/2014   Procedure: DIAGNOSTIC LAPAROSCOPY, OMENTAL BIOPSY, RIGHT OVARY BIOPSY, LYSIS OF ADHESIONS;  Surgeon: Fanny Skates, MD;  Location: [facility];  Service: General;  Laterality: N/A;   MUCOSAL ADVANCEMENT FLAP N/A 06/15/2016   Procedure: EXCISION RECTOVAGINAOL FISTULA WITH MUCOSAL ADVANCEMENT FLAP;  Surgeon: Leighton Ruff, MD;  Location: [facility];  Service: General;  Laterality: N/A;   [place] N/A 09/09/2015   Procedure: PLACEMENT OF SETON;  Surgeon: Leighton Ruff, MD;  Location: [facility];  Service: General;  Laterality: N/A;   PORT-A-CATH REMOVAL Right 08/17/2014   Procedure: REMOVAL INTRAPERITONEAL CHEMO PORT;  Surgeon: Fanny Skates, MD;  Location: [facility];  Service: General;  Laterality: Right;   PORTACATH PLACEMENT Right 08/17/2014   Procedure:  PLACE NEW PORT A CATH;  Surgeon: Fanny Skates, MD;  Location: [facility];  Service: General;  Laterality: Right;   RECTAL BIOPSY N/A 09/09/2015   Procedure: BIOPSY OF RECTOVAGINAL MASS;  Surgeon: Leighton Ruff, MD;  Location: [facility];  Service: General;  Laterality: N/A;   TUBAL LIGATION  YRS AGO   VAGINAL HYSTERECTOMY  1981   fibroids   Current Outpatient Medications on File Prior to Visit  Medication Sig Dispense Refill   b complex vitamins tablet Take 1 tablet by mouth daily.     divalproex (DEPAKOTE ER) 500 MG 24 hr tablet Take 1 tablet (500 mg total) by mouth daily. 90 tablet 3   escitalopram (LEXAPRO) 20 MG tablet Take 1  tablet (20 mg total) by mouth 2 (two) times daily. 90 tablet 3   ketoconazole (NIZORAL) 2 % cream Apply 1 application topically daily. prn     KRILL OIL PO Take 350 mg by mouth daily.     lansoprazole (PREVACID) 15 MG capsule Take 15 mg by mouth as needed.      LORazepam (ATIVAN) 1 MG tablet Take 1 tablet (1 mg total) by mouth 3 (three) times daily as needed for anxiety. 90 tablet 3   losartan (COZAAR) 50 MG tablet Take 1 tablet (50 mg total) by mouth daily. 90 tablet 3   meloxicam (MOBIC) 15 MG tablet Take 1 tablet (15 mg total) by mouth daily. 30 tablet 2   Multiple Vitamin (MULTIVITAMIN) tablet Take 1 tablet by mouth daily.  rosuvastatin (CRESTOR) 20 MG tablet TAKE 1 TABLET BY MOUTH EVERY DAY 90 tablet 1   traMADol (ULTRAM) 50 MG tablet TAKE ONE TABLET EVERY 12 HOURS AS NEEDED FOR PAIN 16 tablet 1   valACYclovir (VALTREX) 1000 MG tablet Take 1 tablet (1,000 mg total) by mouth daily as needed. 5 tablet 11   No current facility-administered medications on file prior to visit.    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: Not on file   Food insecurity:    Worry: Not on file    Inability: Not on file   Transportation needs:    Medical: Not on file    Non-medical: Not on file  Tobacco Use   Smoking status: Current Every Day Smoker    Packs/day: 0.50    Years: 43.00    Pack years: 21.50    Types: Cigarettes   Smokeless tobacco: Never Used  Substance and Sexual Activity   Alcohol use: No   Drug use: No   Sexual activity: Not Currently    Birth control/protection: Surgical  Lifestyle   Physical activity:    Days per week: Not on file    Minutes per session: Not on file   Stress: Not on  file  Relationships   Social connections:    Talks on phone: Not on file    Gets together: Not on file    Attends religious service: Not on file    Active member of club or organization: Not on file    Attends meetings of clubs or organizations: Not on file    Relationship status: Not on file   Intimate partner violence:    Fear of current or ex partner: Not on file    Emotionally abused: Not on file    Physically abused: Not on file    Forced sexual activity: Not on file  Other Topics Concern   Not on file  Social History Narrative   Lives at home with Coralyn Mark   Retired [employer]   Family History  Problem Relation Age of Onset   Bipolar disorder Mother    Anxiety disorder Mother    Dementia Mother    Depression Mother    Mental illness Mother    Vision loss Mother    Alzheimer's disease Mother    Alcohol abuse Paternal Uncle    Bipolar disorder Maternal Grandmother    Dementia Maternal Grandmother    Alzheimer's disease Maternal Grandmother    Alcohol abuse Paternal Uncle    Stroke Brother    Deep vein thrombosis Son    Heart disease Father    Hyperlipidemia Father    Hypertension Father    Stroke Father    Vision loss Father    Atrial fibrillation Sister    Heart disease Brother    Heart disease Brother    ADD / ADHD Neg Hx    Drug abuse Neg Hx    OCD Neg Hx    Paranoid behavior Neg Hx    Schizophrenia Neg Hx    Seizures Neg Hx    Sexual abuse Neg Hx    Physical abuse Neg Hx       Review of Systems  All other systems reviewed and are negative.      Objective:   Physical Exam Vitals signs reviewed.  Constitutional:      General: She is not in acute distress.    Appearance: She is well-developed. She is not diaphoretic.  HENT:     Mouth/Throat:     Pharynx: No oropharyngeal exudate.  Eyes:     General: No scleral icterus.       Right eye: No discharge.        Left eye: No discharge.     Conjunctiva/sclera:  Conjunctivae normal.     Pupils: Pupils are equal, round, and reactive to light.  Neck:     Thyroid: No thyromegaly.     Vascular: No JVD.     Trachea: No tracheal deviation.  Cardiovascular:     Rate and Rhythm: Normal rate and regular rhythm.     Pulses:          Carotid pulses are on the left side with bruit.    Heart sounds: Normal heart sounds. No murmur. No friction rub. No gallop.   Pulmonary:     Effort: Pulmonary effort is normal. No respiratory distress.     Breath sounds: Normal breath sounds. No stridor. No wheezing or rales.  Chest:     Chest wall: No tenderness.  Musculoskeletal:        General: No swelling, tenderness, deformity or signs of injury.     Right lower leg: Edema present.     Left lower leg: No edema.  Skin:    General: Skin is warm.     Findings: No erythema.  Neurological:     Mental Status: She is alert.     Motor: No abnormal muscle tone.           Assessment & Plan:  Chronic venous insufficiency  I believe the patient has chronic venous insufficiency right greater than left and the swelling is causing aching discomfort in her ankles and legs.  I recommended that she use knee-high compression hose every day to help control the swelling to help the discomfort.  She can also use horse Chestnut seed extract daily to help control the swelling and discomfort.  On bad days she can take Lasix 20 mg a day but I recommended she use this sparingly only on bad days and not daily to avoid dehydration.  I doubt pseudogout given the lack of significant pain.  Remainder of work-up has been normal

## 2019-02-07 ENCOUNTER — Other Ambulatory Visit: Payer: Self-pay

## 2019-02-07 ENCOUNTER — Ambulatory Visit (HOSPITAL_COMMUNITY): Payer: Medicare Other | Attending: Orthopaedic Surgery | Admitting: Physical Therapy

## 2019-02-07 DIAGNOSIS — M545 Low back pain, unspecified: Secondary | ICD-10-CM

## 2019-02-07 DIAGNOSIS — M6281 Muscle weakness (generalized): Secondary | ICD-10-CM | POA: Insufficient documentation

## 2019-02-07 NOTE — Patient Instructions (Signed)
sAnterior Lateral Step-Up   Step onto step with right foot, facing forward, and without pushing off with the ground foot. Finish with ground foot in touch balance on step and return _10__ times.   Step: Up, Anterior     Step down backward, involved leg first, lower body using other leg.Repeat __10__ times per set.   Chair squat   Stand with legs hip-width apart. Inhaling, bend knees trying to keep knees over ankles, as if to sit on a chair and extend arms in front, wrists slightly lower than shoulders. Complete 10 reps  EXTENSION: Standing (Active)    Stand, both feet flat. Draw right leg behind body as far as possible.  Complete_10__ repetitions.   Hip Abduction (Standing)    Stand with support. Squeeze pelvic floor and hold. Lift right leg out to side, keeping toe forward. Repeat _10__ times.

## 2019-02-07 NOTE — Therapy (Signed)
[facility], [place], [address] Phone: [phone]   Fax:  [phone]  Physical Therapy Treatment Discharge  Patient Details  Name: Abigail Wagner MRN: [id number] Date of Birth: 09/17/1951 Referring Provider (PT): Marybelle Killings, MD   Encounter Date: 02/07/2019  PT End of Session - 02/07/19 1212    Visit Number  3    Number of Visits  13    Date for PT Re-Evaluation  01/15/19   Minireassess 12/25/18   Authorization Type  [employer] Medicare (Secondary: U[employer] Medicare)    Authorization Time Period  12/04/18 to 01/15/19    Authorization - Visit Number  3    Authorization - Number of Visits  10    PT Start Time  1115    PT Stop Time  1200    PT Time Calculation (min)  45 min    Activity Tolerance  Patient tolerated treatment well;No increased pain    Behavior During Therapy  WFL for tasks assessed/performed       Past Medical History:  Diagnosis Date  . Allergy   . Anal carcinoma [facility]) oncologist-  dr Whitney Muse ([facility])   dx 12/ 2016 [facility] --  chemo and radiation- Dr. Marcello Moores  . Anemia    due to her cancer  . Anxiety    Dr. Harrington Challenger at [place] (psychiatrist).    . Carcinoma of ovary, stage 3 [facility]) oncologist-  dr gehrig/  dr Doreene Eland (cancer center in [place]/ [facility])--  no recurrency   dx 09/ 2015  Stage IIIB  papillary ovarian carcinoma  s/p  omentectomy and BSO &  chemotherapy (completed 12-07-2014)  . Chemotherapy induced nausea and vomiting 10/23/2014  . Chronic kidney disease    stones  . DDD (degenerative disc disease), lumbosacral   . GERD (gastroesophageal reflux disease)   . Headache(784.0)   . History of acute respiratory failure    05-30-2009  drug overdose-- (intubated for 2 days)  and aspiration pneumonia  . History of adenomatous polyp of colon 10/07/2015   tubular adenoma high grade dysplasia  . History of herpes genitalis   . History of kidney stones   . History of ovarian cancer 08/03/2015  .  History of suicide attempt    per documentation in epic  05-30-2009  overdose benaodiazepine  . HTN (hypertension)    takes Metoprolol daily  . Hyperlipidemia   . Hypertension 09/27/2016  . IBS (irritable bowel syndrome)    takes Bentyl daily  . Internal carotid artery stenosis, left    <50%, ulcerative plaque at carotid bulb.   . Major depression   . Mood disorder ([place])   . OA (osteoarthritis)    both hips  . Ovarian cancer ([place])    2015-TAH/BSO  . Prolapse of vaginal vault after hysterectomy 07/20/2015  . Rectovaginal fistula 08/05/2015  . Sigmoid diverticulosis   . Smokers' cough [facility])     Past Surgical History:  Procedure Laterality Date  . BREAST ENHANCEMENT SURGERY  1986  . [place]  . COLONOSCOPY N/A 10/07/2015   Procedure: COLONOSCOPY;  Surgeon: Rogene Houston, MD;  [facility];  Service: Endoscopy;  Laterality: N/A;  7:30  . EXPLORATORY LAPAROTOMY/ OMENTECTOMY/  BILATERAL SALPINGOOPHORECTOMY/  [facility] PLACEMENT  07-03-2014   [facility]  . KNEE ARTHROSCOPY Left 09-22-2004  . LAPAROSCOPY N/A 06/02/2014   Procedure: DIAGNOSTIC LAPAROSCOPY, OMENTAL BIOPSY, RIGHT OVARY BIOPSY, LYSIS OF ADHESIONS;  Surgeon: Fanny Skates, MD;  Location: [facility];  Service: General;  Laterality: N/A;  . MUCOSAL ADVANCEMENT FLAP N/A 06/15/2016   Procedure: EXCISION RECTOVAGINAOL FISTULA WITH MUCOSAL ADVANCEMENT FLAP;  Surgeon: Leighton Ruff, MD;  Location: [place];  Service: General;  Laterality: N/A;  . PLACEMENT OF SETON N/A 09/09/2015   Procedure: PLACEMENT OF SETON;  Surgeon: Leighton Ruff, MD;  Location: [place];  Service: General;  Laterality: N/A;  . PORT-A-CATH REMOVAL Right 08/17/2014   Procedure: REMOVAL INTRAPERITONEAL CHEMO PORT;  Surgeon: Fanny Skates, MD;  Location: [place];  Service: General;  Laterality: Right;  . PORTACATH PLACEMENT Right 08/17/2014   Procedure:  PLACE NEW PORT A CATH;  Surgeon:  Fanny Skates, MD;  Location: [place];  Service: General;  Laterality: Right;  . RECTAL BIOPSY N/A 09/09/2015   Procedure: BIOPSY OF RECTOVAGINAL MASS;  Surgeon: Leighton Ruff, MD;  Location: [place];  Service: General;  Laterality: N/A;  . TUBAL LIGATION  YRS AGO  . VAGINAL HYSTERECTOMY  1981   fibroids    There were no vitals filed for this visit.  Subjective Assessment - 02/07/19 1205    Subjective  Pt states she has been doing well with her HEP and has overall improved.  States she has been doing all ADLS and going into yard, however taking extra time to complete due to LE fatigue and just being cautious.  STates her balance is still not where she wants it and notes LE weakness when going up and down steps     Currently in Pain?  No/denies         [facility] PT Assessment - 02/07/19 0001      Assessment   Medical Diagnosis  low back pain, DDD    Referring Provider (PT)  Marybelle Killings, MD    Onset Date/Surgical Date  10/05/18    Next MD Visit  unknown      Observation/Other Assessments   Focus on Therapeutic Outcomes (FOTO)   FOTO: 37% limitation (was 63%)      AROM   AROM Assessment Site  Lumbar    Lumbar Flexion  WNL    Lumbar Extension  WNL    Lumbar - Right Side Bend  WNL    Lumbar - Left Side Bend  WNL    Lumbar - Right Rotation  25% limited    Lumbar - Left Rotation  35% limited      Strength   Right Hip Flexion  5/5   was 5/5   Right Hip Extension  4-/5   was 2+/5   Right Hip ABduction  4+/5   was 4/5   Left Hip Flexion  5/5    Left Hip Extension  4-/5   was 3+/5   Left Hip ABduction  4+/5   was 4/5   Right Knee Flexion  4-/5   was 3-/5 with cramping in hamstring   Right Knee Extension  5/5   was 5/5   Left Knee Flexion  4+/5   was 4-/5 with cramping in hamstring   Left Knee Extension  5/5   was 5/5   Right Ankle Dorsiflexion  5/5   was 5/5   Left Ankle Dorsiflexion  5/5   was 5/5     Special Tests    Special  Tests  Lumbar    Lumbar Tests  Straight Leg Raise      Straight Leg Raise   Findings  Negative  Comment  BLE      Ambulation/Gait   Ambulation/Gait  Yes    Ambulation/Gait Assistance  7: Independent    Ambulation Distance (Feet)  364 Feet    Assistive device  None    Gait Pattern  Step-through pattern;Decreased arm swing - right;Trendelenburg;Antalgic    Ambulation Surface  Level    Gait Comments  3 MWT without rest break                   [facility] Adult PT Treatment/Exercise - 02/07/19 0001      Lumbar Exercises: Stretches   Active Hamstring Stretch  2 reps;30 seconds    Active Hamstring Stretch Limitations  reviewed      Lumbar Exercises: Standing   Other Standing Lumbar Exercises  SLR max 5" Rt, 36" Lt    Other Standing Lumbar Exercises  tandem gait, standing hip abd/extension, step ups lateraal/fwd and squats all for HEP             PT Education - 02/07/19 1213    Education Details  reveiwed and updated HEP to progress to more challenging exercises.  explained progress and remaining deficits.  Instructed to contact clinic if any issues or concerns.     Person(s) Educated  Patient    Methods  Explanation    Comprehension  Verbalized understanding       PT Short Term Goals - 02/07/19 1207      PT SHORT TERM GOAL #1   Title  Pt will have improved MMT by 1/2 grade throughout all deficient mm groups in order to reduce pain and improve stair ambulation.     Time  3    Period  Weeks    Status  Achieved    Target Date  12/25/18      PT SHORT TERM GOAL #2   Title  Pt will be able to perform bil SLS for 10 sec or > without UE in order to demo improved core and functional strength so as to maximize stairs.     Baseline  5/15: can on Lt but still 5" on Rt    Time  3    Period  Weeks    Status  Partially Met      PT SHORT TERM GOAL #3   Title  Pt will have 141f improvement in 3MWT without a rest break and with 2/10 LBP/bil hip pain or < to demo improved  functional strength and maximize her HH mobility.     Baseline  5/15: distance about the same, however no rest break or pain.    Time  3    Period  Weeks    Status  Partially Met        PT Long Term Goals - 02/07/19 1208      PT LONG TERM GOAL #1   Title  Pt will have improved MMT by 1 grade throughout all deficient mm groups in order to further reduce LBP and maximize her functional mobility.    Time  6    Period  Weeks    Status  Partially Met      PT LONG TERM GOAL #2   Title  Pt will report being able to stand for 30 mins or > with 2/10 LBP/bil hip pain or < to allow her to perform light HH chores with greater ease.     Time  6    Period  Weeks    Status  Achieved  PT LONG TERM GOAL #3   Title  Pt will report participating in a regular walking program at least 2 days/week to demo improved overall function, mobility, and return to PLOF.     Time  6    Period  Weeks    Status  On-going            Plan - 02/07/19 1348    Clinical Impression Statement  Pt returned to clinic today after only completing evaluation and one additional treatment in March.  clinic has been closed due to Covid-19. Pt returns reporting steady improvements with compliance to HEP.  Pt has been contacted several times during the 2 month closure to follow up, answer questions and update HEP.   ROM and strength have improved and has met several of her goals.  Most of her issues now are limited by her balance and remaining strength deficits, not lumbar pain.  Updated HEP to include more advanced strengthening and balance actviities.   PT feels she can continue to manage these on her own and will check in with our office if needed.        Personal Factors and Comorbidities  Age;Comorbidity 3+    Comorbidities  see medical history above    Examination-Activity Limitations  Stairs;Stand    Examination-Participation Restrictions  Community Activity;Cleaning;Laundry    Stability/Clinical Decision Making   Stable/Uncomplicated    Rehab Potential  Fair    PT Frequency  2x / week    PT Duration  6 weeks    PT Treatment/Interventions  ADLs/Self Care Home Management;Aquatic Therapy;Cryotherapy;Electrical Stimulation;Moist Heat;Traction;Ultrasound;DME Instruction;Gait training;Stair training;Functional mobility training;Therapeutic activities;Therapeutic exercise;Balance training;Neuromuscular re-education;Patient/family education;Orthotic Fit/Training;Manual techniques;Scar mobilization;Passive range of motion;Dry needling;Energy conservation;Taping;Spinal Manipulations;Joint Manipulations    PT Next Visit Plan  Discharge pateint at this time per goals obtained and per pateint request.     PT Home Exercise Plan  eval: LTRs, SKTC 5/15:  standing hip ext/abd, squats, step ups lateral and foward, tandem gait/SLS vectors       Patient will benefit from skilled therapeutic intervention in order to improve the following deficits and impairments:  Abnormal gait, Decreased activity tolerance, Decreased balance, Decreased endurance, Decreased mobility, Decreased range of motion, Decreased strength, Difficulty walking, Hypomobility, Increased fascial restricitons, Increased muscle spasms, Impaired flexibility, Improper body mechanics, Postural dysfunction, Pain  Visit Diagnosis: Acute bilateral low back pain without sciatica  Muscle weakness (generalized)     Problem List Patient Active Problem List   Diagnosis Date Noted  . Internal carotid artery stenosis, left   . Neural foraminal stenosis of lumbar spine 05/16/2018  . Genetic testing 05/24/2017  . HLD (hyperlipidemia) 02/07/2017  . Normocytic normochromic anemia 01/10/2017  . GERD (gastroesophageal reflux disease) 09/27/2016  . Back pain 09/27/2016  . Anxiety 09/27/2016  . Allergy 09/27/2016  . Radiation proctitis 12/23/2015  . Anal cancer ([place]) 11/19/2015  . History of ovarian cancer 08/03/2015  . Prolapse of vaginal vault after hysterectomy  07/20/2015  . Migraines 05/20/2015  . Port-A-Cath in place 01/13/2015  . Epithelial ovarian cancer, FIGO stage IIIA ([place]) 07/16/2014  . Abdominal carcinomatosis ([place]) 06/18/2014  . Bipolar 1 disorder ([place]) 12/27/2011  . Kidney stones 12/27/2011  . IBS (irritable colon syndrome) 12/27/2011  . Depression 11/07/2011   Teena Irani, PTA/CLT [phone]  Teena Irani 02/07/2019, 2:06 PM  [facility] [address] [place], [place], [address] Phone: [phone]   Fax:  [phone]  Name: ANSHU WEHNER MRN: [id number] Date  of Birth: 12-Jun-1951

## 2019-02-10 ENCOUNTER — Other Ambulatory Visit: Payer: Self-pay

## 2019-02-10 ENCOUNTER — Ambulatory Visit (INDEPENDENT_AMBULATORY_CARE_PROVIDER_SITE_OTHER): Payer: Medicare Other | Admitting: Psychiatry

## 2019-02-10 ENCOUNTER — Encounter (HOSPITAL_COMMUNITY): Payer: Self-pay | Admitting: Psychiatry

## 2019-02-10 DIAGNOSIS — F331 Major depressive disorder, recurrent, moderate: Secondary | ICD-10-CM

## 2019-02-10 MED ORDER — LORAZEPAM 1 MG PO TABS: 1.0000 mg | ORAL_TABLET | Freq: Three times a day (TID) | ORAL | 3 refills | Status: DC | PRN

## 2019-02-10 MED ORDER — DIVALPROEX SODIUM ER 500 MG PO TB24: 500.0000 mg | ORAL_TABLET | Freq: Every day | ORAL | 3 refills | Status: DC

## 2019-02-10 MED ORDER — ESCITALOPRAM OXALATE 20 MG PO TABS: 20.0000 mg | ORAL_TABLET | Freq: Two times a day (BID) | ORAL | 3 refills | Status: DC

## 2019-02-10 NOTE — Progress Notes (Signed)
[facility] MD/PA/NP OP Progress Note  02/10/2019 10:13 AM Abigail Wagner  MRN:  [id number] Virtual Visit via Telephone Note  I connected with Abigail Wagner on 02/10/19 at 10:00 AM EDT by telephone and verified that I am speaking with the correct person using two identifiers.   I discussed the limitations, risks, security and privacy concerns of performing an evaluation and management service by telephone and the availability of in person appointments. I also discussed with the patient that there may be a patient responsible charge related to this service. The patient expressed understanding and agreed to proceed.      I discussed the assessment and treatment plan with the patient. The patient was provided an opportunity to ask questions and all were answered. The patient agreed with the plan and demonstrated an understanding of the instructions.   The patient was advised to call back or seek an in-person evaluation if the symptoms worsen or if the condition fails to improve as anticipated.  I provided 15 minutes of non-face-to-face time during this encounter.   [name], MD   Chief Complaint:  Chief Complaint    Depression; Anxiety; Follow-up     HPI: This patient is a 68 year old married white female lives with her husband in [place]. She has 3 children and 5 grandchildren. She is retired from [employer].  The patient states she's had depression since her 30s. She was hospitalized several times, last time being in 2010 when she took a drug overdose. She was going through a lot of family stress back then. She states that she was hospitalized [facility] and since then she has never wanted to go back. She's been quite stable despite her low doses of medication. She's also stopped drinking alcohol which is made a big difference.  The patient returns for follow-up after 4 months.  She states overall she is doing well.  She is getting physical therapy for her lower back pain  and it is helping.  She is trying to be less sedentary and walk a little bit a few times each day.  She is sleeping well.  She denies serious symptoms of depression anxiety or suicidal ideation.  She feels that her medications are very helpful.  She denies any manic symptoms.  She is no longer using any alcohol.  She is taking tramadol for pain which is helpful. Visit Diagnosis:    ICD-10-CM   1. Major depressive disorder, recurrent episode, moderate (HCC) F33.1 escitalopram (LEXAPRO) 20 MG tablet    Past Psychiatric History: Past hospitalizations for depression and alcohol abuse, none since 2010  Past Medical History:  Past Medical History:  Diagnosis Date  . Allergy   . Anal carcinoma [facility]) oncologist-  dr Whitney Muse ([facility])   dx 12/ 2016 [facility] --  chemo and radiation- Dr. Marcello Moores  . Anemia    due to her cancer  . Anxiety    Dr. Harrington Challenger at [place] (psychiatrist).    . Carcinoma of ovary, stage 3 [facility] [place]) oncologist-  dr gehrig/  dr Doreene Eland (cancer center in [place] [facility])--  no recurrency   dx 09/ 2015  Stage IIIB  papillary ovarian carcinoma  s/p  omentectomy and BSO &  chemotherapy (completed 12-07-2014)  . Chemotherapy induced nausea and vomiting 10/23/2014  . Chronic kidney disease    stones  . DDD (degenerative disc disease), lumbosacral   . GERD (gastroesophageal reflux disease)   . Headache(784.0)   . History of acute respiratory failure  05-30-2009  drug overdose-- (intubated for 2 days)  and aspiration pneumonia  . History of adenomatous polyp of colon 10/07/2015   tubular adenoma high grade dysplasia  . History of herpes genitalis   . History of kidney stones   . History of ovarian cancer 08/03/2015  . History of suicide attempt    per documentation in epic  05-30-2009  overdose benaodiazepine  . HTN (hypertension)    takes Metoprolol daily  . Hyperlipidemia   . Hypertension 09/27/2016  . IBS (irritable bowel syndrome)    takes Bentyl daily  . Internal carotid  artery stenosis, left    <50%, ulcerative plaque at carotid bulb.   . Major depression   . Mood disorder ([place])   . OA (osteoarthritis)    both hips  . Ovarian cancer ([place])    2015-TAH/BSO  . Prolapse of vaginal vault after hysterectomy 07/20/2015  . Rectovaginal fistula 08/05/2015  . Sigmoid diverticulosis   . Smokers' cough [facility])     Past Surgical History:  Procedure Laterality Date  . BREAST ENHANCEMENT SURGERY  1986  . [place]  . COLONOSCOPY N/A 10/07/2015   Procedure: COLONOSCOPY;  Surgeon: Rogene Houston, MD;  [facility];  Service: Endoscopy;  Laterality: N/A;  7:30  . EXPLORATORY LAPAROTOMY/ OMENTECTOMY/  BILATERAL SALPINGOOPHORECTOMY/  [facility] PLACEMENT  07-03-2014   [facility]  . KNEE ARTHROSCOPY Left 09-22-2004  . LAPAROSCOPY N/A 06/02/2014   Procedure: DIAGNOSTIC LAPAROSCOPY, OMENTAL BIOPSY, RIGHT OVARY BIOPSY, LYSIS OF ADHESIONS;  Surgeon: Fanny Skates, MD;  Location: [place];  Service: General;  Laterality: N/A;  . MUCOSAL ADVANCEMENT FLAP N/A 06/15/2016   Procedure: EXCISION RECTOVAGINAOL FISTULA WITH MUCOSAL ADVANCEMENT FLAP;  Surgeon: Leighton Ruff, MD;  Location: [place];  Service: General;  Laterality: N/A;  . PLACEMENT OF SETON N/A 09/09/2015   Procedure: PLACEMENT OF SETON;  Surgeon: Leighton Ruff, MD;  Location: [place];  Service: General;  Laterality: N/A;  . PORT-A-CATH REMOVAL Right 08/17/2014   Procedure: REMOVAL INTRAPERITONEAL CHEMO PORT;  Surgeon: Fanny Skates, MD;  Location: [place];  Service: General;  Laterality: Right;  . PORTACATH PLACEMENT Right 08/17/2014   Procedure:  PLACE NEW PORT A CATH;  Surgeon: Fanny Skates, MD;  Location: [place];  Service: General;  Laterality: Right;  . RECTAL BIOPSY N/A 09/09/2015   Procedure: BIOPSY OF RECTOVAGINAL MASS;  Surgeon: Leighton Ruff, MD;  Location: [place];  Service: General;   Laterality: N/A;  . TUBAL LIGATION  YRS AGO  . VAGINAL HYSTERECTOMY  1981   fibroids    Family Psychiatric History: see below  Family History:  Family History  Problem Relation Age of Onset  . Bipolar disorder Mother   . Anxiety disorder Mother   . Dementia Mother   . Depression Mother   . Mental illness Mother   . Vision loss Mother   . Alzheimer's disease Mother   . Alcohol abuse Paternal Uncle   . Bipolar disorder Maternal Grandmother   . Dementia Maternal Grandmother   . Alzheimer's disease Maternal Grandmother   . Alcohol abuse Paternal Uncle   . Stroke Brother   . Deep vein thrombosis Son   . Heart disease Father   . Hyperlipidemia Father   . Hypertension Father   . Stroke Father   . Vision loss Father   . Atrial fibrillation Sister   . Heart disease Brother   . Heart disease Brother   .  ADD / ADHD Neg Hx   . Drug abuse Neg Hx   . OCD Neg Hx   . Paranoid behavior Neg Hx   . Schizophrenia Neg Hx   . Seizures Neg Hx   . Sexual abuse Neg Hx   . Physical abuse Neg Hx     Social History:  Social History   Socioeconomic History  . Marital status: Married    Spouse name: Coralyn Mark  . Number of children: 3  . Years of education: 50  . Highest education level: Not on file  Occupational History  . Occupation: retire    Comment: Animal nutritionist  . Financial resource strain: Not on file  . Food insecurity:    Worry: Not on file    Inability: Not on file  . Transportation needs:    Medical: Not on file    Non-medical: Not on file  Tobacco Use  . Smoking status: Current Every Day Smoker    Packs/day: 0.50    Years: 43.00    Pack years: 21.50    Types: Cigarettes  . Smokeless tobacco: Never Used  Substance and Sexual Activity  . Alcohol use: No  . Drug use: No  . Sexual activity: Not Currently    Birth control/protection: Surgical  Lifestyle  . Physical activity:    Days per week: Not on file    Minutes per session: Not on file  . Stress: Not  on file  Relationships  . Social connections:    Talks on phone: Not on file    Gets together: Not on file    Attends religious service: Not on file    Active member of club or organization: Not on file    Attends meetings of clubs or organizations: Not on file    Relationship status: Not on file  Other Topics Concern  . Not on file  Social History Narrative   Lives at home with Coralyn Mark   Retired [employer]    Allergies: No Known Allergies  Metabolic Disorder Labs: No results found for: HGBA1C, MPG No results found for: PROLACTIN Lab Results  Component Value Date   CHOL 225 (H) 05/21/2018   TRIG 85 05/21/2018   HDL 53 05/21/2018   CHOLHDL 4.2 05/21/2018   VLDL 28 01/10/2017   LDLCALC 153 (H) 05/21/2018   LDLCALC 164 (H) 01/10/2017     Therapeutic Level Labs: No results found for: LITHIUM No results found for: VALPROATE No components found for:  CBMZ  Current Medications: Current Outpatient Medications  Medication Sig Dispense Refill  . b complex vitamins tablet Take 1 tablet by mouth daily.    . divalproex (DEPAKOTE ER) 500 MG 24 hr tablet Take 1 tablet (500 mg total) by mouth daily. 90 tablet 3  . escitalopram (LEXAPRO) 20 MG tablet Take 1 tablet (20 mg total) by mouth 2 (two) times daily. 90 tablet 3  . furosemide (LASIX) 20 MG tablet Take 1 tablet (20 mg total) by mouth daily as needed. 30 tablet 3  . ketoconazole (NIZORAL) 2 % cream Apply 1 application topically daily. prn    . KRILL OIL PO Take 350 mg by mouth daily.    . lansoprazole (PREVACID) 15 MG capsule Take 15 mg by mouth as needed.     Marland Kitchen LORazepam (ATIVAN) 1 MG tablet Take 1 tablet (1 mg total) by mouth 3 (three) times daily as needed for anxiety. 90 tablet 3  . losartan (COZAAR) 50 MG tablet Take 1 tablet (  50 mg total) by mouth daily. 90 tablet 3  . meloxicam (MOBIC) 15 MG tablet Take 1 tablet (15 mg total) by mouth daily. 30 tablet 2  . Multiple Vitamin (MULTIVITAMIN) tablet Take 1 tablet by mouth  daily.    . rosuvastatin (CRESTOR) 20 MG tablet TAKE 1 TABLET BY MOUTH EVERY DAY 90 tablet 1  . traMADol (ULTRAM) 50 MG tablet TAKE ONE TABLET EVERY 12 HOURS AS NEEDED FOR PAIN 16 tablet 1  . valACYclovir (VALTREX) 1000 MG tablet Take 1 tablet (1,000 mg total) by mouth daily as needed. 5 tablet 11   No current facility-administered medications for this visit.      Musculoskeletal: Strength & Muscle Tone: within normal limits Gait & Station: normal Patient leans: N/A  Psychiatric Specialty Exam: Review of Systems  Constitutional: Positive for malaise/fatigue.  Musculoskeletal: Positive for back pain.  All other systems reviewed and are negative.   There were no vitals taken for this visit.There is no height or weight on file to calculate BMI.  General Appearance: NA  Eye Contact:  NA  Speech:  Clear and Coherent  Volume:  Normal  Mood:  Euthymic  Affect:  NA  Thought Process:  Goal Directed  Orientation:  Full (Time, Place, and Person)  Thought Content: WDL   Suicidal Thoughts:  No  Homicidal Thoughts:  No  Memory:  Immediate;   Good Recent;   Good Remote;   Good  Judgement:  Good  Insight:  Good  Psychomotor Activity:  Decreased  Concentration:  Concentration: Good and Attention Span: Good  Recall:  Good  Fund of Knowledge: Good  Language: Good  Akathisia:  No  Handed:  Right  AIMS (if indicated): not done  Assets:  Communication Skills Desire for Improvement Resilience Social Support Talents/Skills  ADL's:  Intact  Cognition: WNL  Sleep:  Good   Screenings: PHQ2-9     Office Visit from 05/14/2018 in [facility] Office Visit from 01/15/2018 in [facility] from 01/08/2017 in [facility] Office Visit from 11/07/2016 in [facility]  PHQ-2 Total Score  1  1  0  0  PHQ-9 Total Score  4  -  -  -       Assessment and Plan: This patient is a 68 year old female with a history of depression, remote  history of alcohol abuse and anxiety.  She is doing well on her current regimen.  She will continue Ativan 1 mg 3 times daily as needed for anxiety, Lexapro 20 mg twice daily for depression and Depakote ER 500 mg at bedtime for mood stabilization.  She will return to see me in 4 months   [name], MD 02/10/2019, 10:13 AM

## 2019-02-14 IMAGING — US US CAROTID DUPLEX BILAT
1 series · 13 of 24 positions shown · non-contrast
Comparison: None.

CLINICAL DATA: 67-year-old with a left carotid bruit.

EXAM:
BILATERAL CAROTID DUPLEX ULTRASOUND
TECHNIQUE: Gray scale imaging, color Doppler and duplex ultrasound were
performed of bilateral carotid and vertebral arteries in the neck.

[Series 1: us carotid duplex bilat · 0.06mm/px · 13 of 70 slices shown]
[im 1/70]
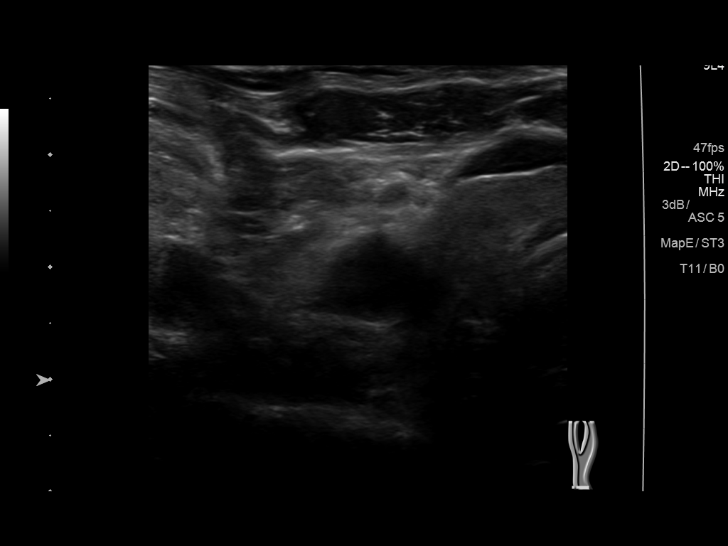
[im 7/70]
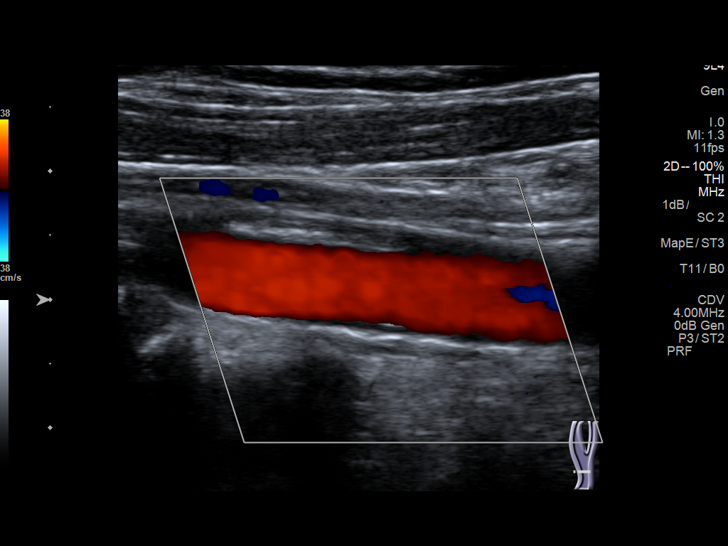
[im 13/70]
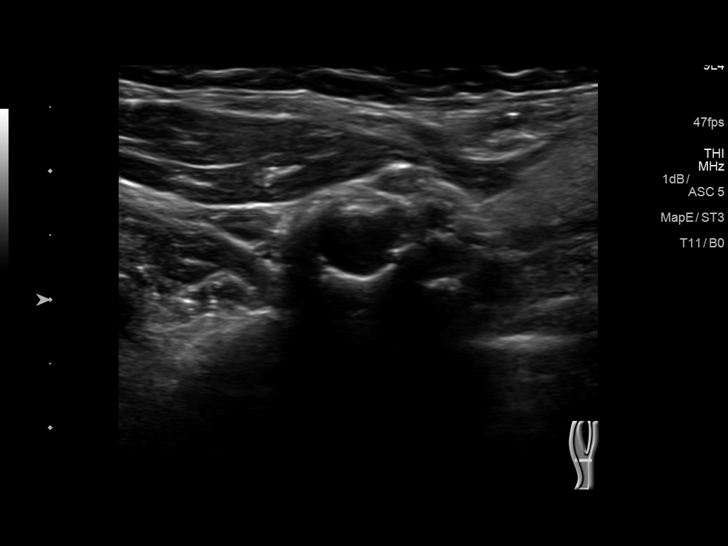
[im 19/70]
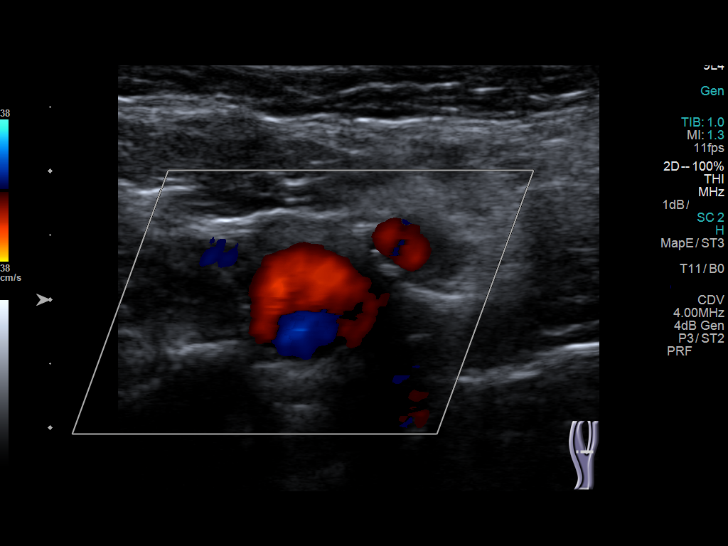
[im 25/70]
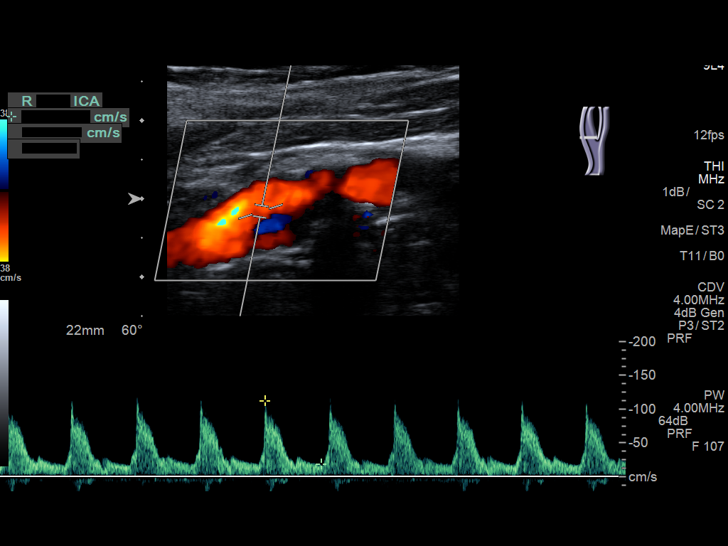
[im 31/70]
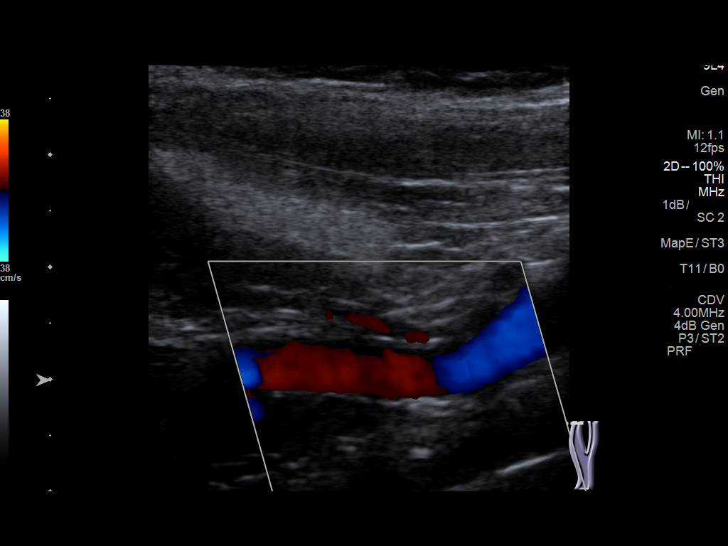
[im 37/70]
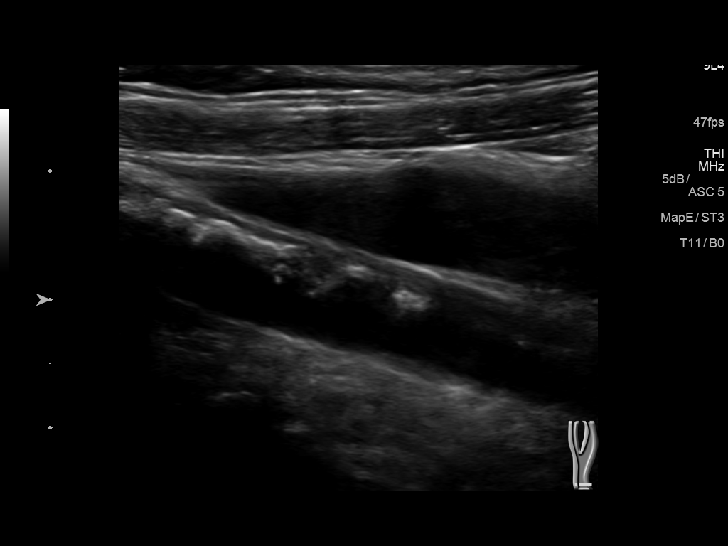
[im 40/70]
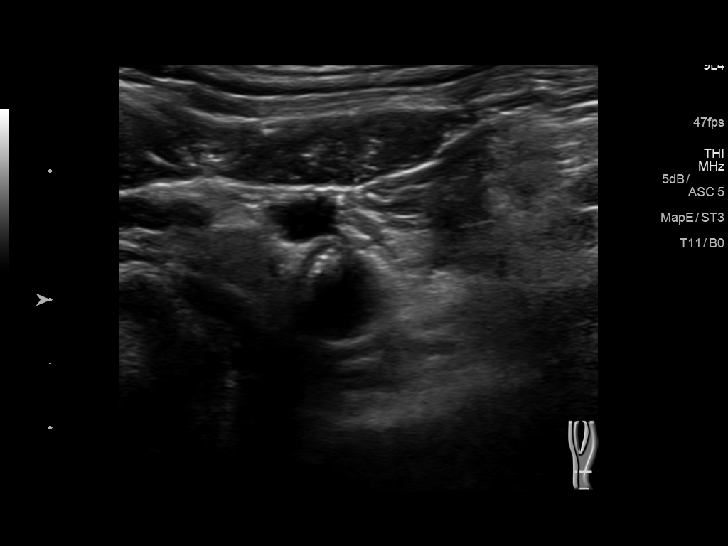
[im 46/70]
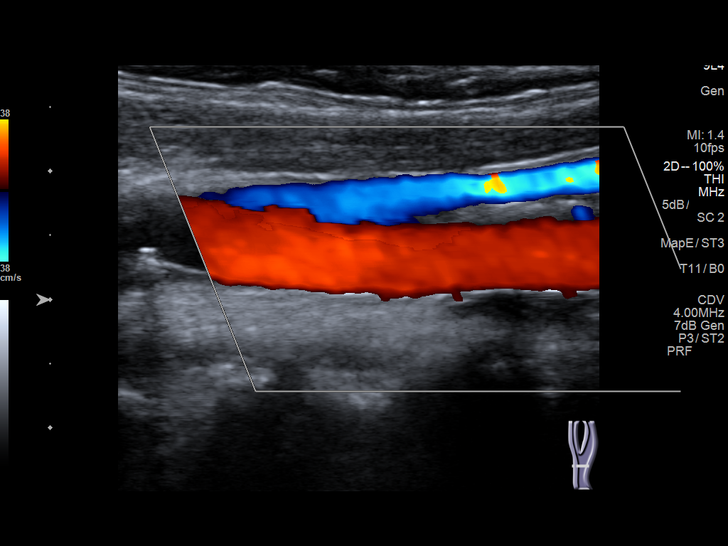
[im 52/70]
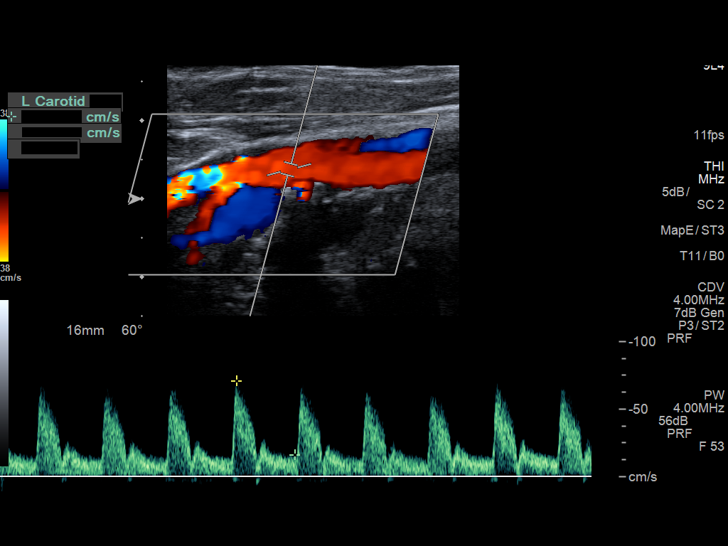
[im 58/70]
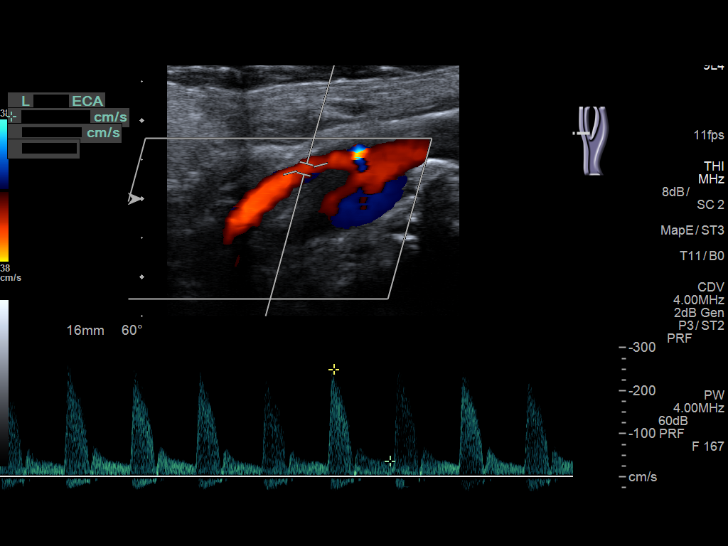
[im 64/70]
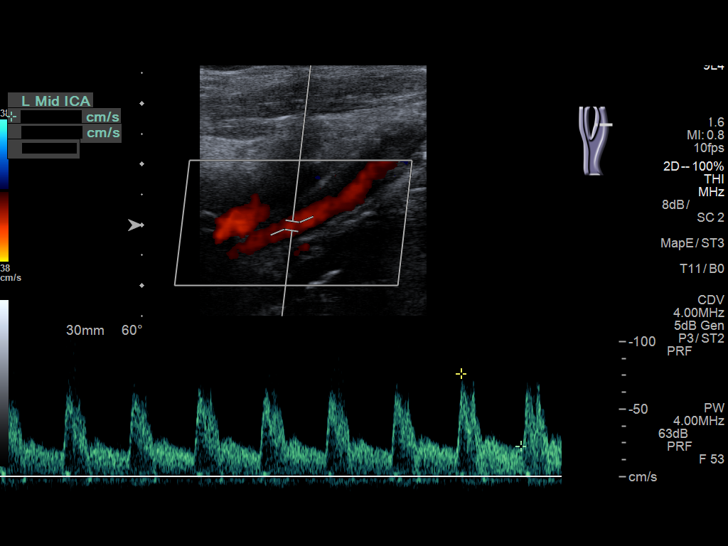
[im 70/70]
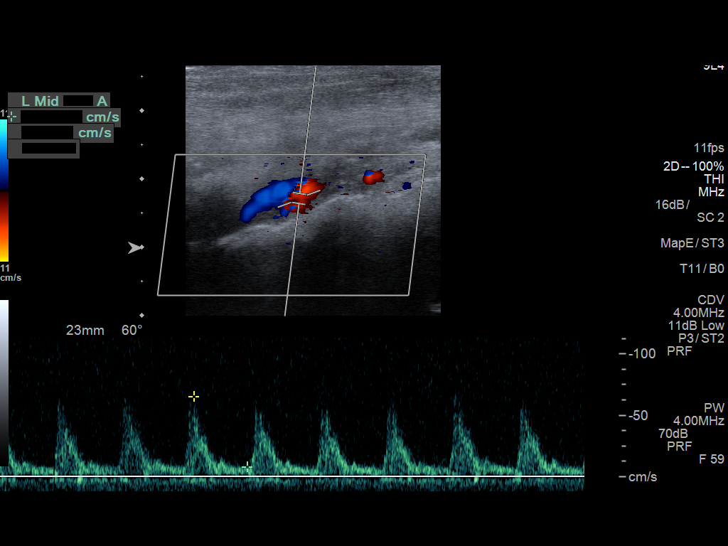

[13 of 24 positions shown; findings below may reference images not displayed]

FINDINGS: Criteria: Quantification of carotid stenosis is based on velocity
parameters that correlate the residual internal carotid diameter
with NASCET-based stenosis levels, using the diameter of the distal
internal carotid lumen as the denominator for stenosis measurement.

The following velocity measurements were obtained:

RIGHT

ICA: 125 cm/sec

CCA: 81 cm/sec

SYSTOLIC ICA/CCA RATIO:

ECA: 154 cm/sec

LEFT

ICA: 127 cm/sec

CCA: 98 cm/sec

SYSTOLIC ICA/CCA RATIO:

ECA: 250 cm/sec

RIGHT CAROTID ARTERY: Moderate amount of plaque in the right common
carotid artery and carotid bulb. Shadowing plaque at the right
carotid bulb limits evaluation of the lumen in this area. Irregular
plaque near the origin of the external carotid artery. External
carotid artery is patent with normal waveform. Echogenic calcified
plaque involving the proximal internal carotid artery. Normal
waveforms and velocities in the internal carotid artery.

RIGHT VERTEBRAL ARTERY: Antegrade flow and normal waveform in the
right vertebral artery.

LEFT CAROTID ARTERY: Extensive echogenic plaque in the left common
carotid artery. There is an ulcerative plaque at the left carotid
bulb. Slightly elevated peak systolic velocity in the external
carotid artery. Calcified plaque involving the proximal internal
carotid artery. Heterogeneous plaque in the mid left internal
carotid artery.

LEFT VERTEBRAL ARTERY: Antegrade flow and normal waveform in the
left vertebral artery.
IMPRESSION: Extensive atherosclerotic disease involving the bilateral carotid
arteries. Estimated degree of stenosis in the internal carotid
arteries is less than 50% bilaterally.

Focal ulcerative plaque at the left carotid bulb.

Patent vertebral arteries with antegrade flow.

## 2019-02-19 ENCOUNTER — Encounter (HOSPITAL_COMMUNITY): Payer: Self-pay

## 2019-02-19 NOTE — Therapy (Signed)
[place], [place], [address] Phone: [phone]   Fax:  [phone]  Patient Details  Name: Abigail Wagner MRN: [id number] Date of Birth: [birth year] Referring Provider:  No ref. provider found  Encounter Date: 02/19/2019   PHYSICAL THERAPY DISCHARGE SUMMARY  Visits from Start of Care: 3  Current functional level related to goals / functional outcomes: Patient returned to clinic for re-assessment on 02/07/19 after ~ 2 month closure due to COVID-19. She has been provided HEP updates throughout closure and has reported steady improvement. She has made improvements in Bil LE strength throughout and has improve lumbar mobility. Patient has noted most difficulty is with her balance currently and she was provided additional exercises to work on this at home. She reports feeling confident she can continue independently and was educated if she feels a need to return to the clinic to obtain a new referral.  She will be discharged from current episode.    Remaining deficits: See last re-assessment on 02/07/19, goals status below:        PT Short Term Goals - 02/07/19 1207            PT SHORT TERM GOAL #1   Title  Pt will have improved MMT by 1/2 grade throughout all deficient mm groups in order to reduce pain and improve stair ambulation.     Time  3    Period  Weeks    Status  Achieved    Target Date  12/25/18        PT SHORT TERM GOAL #2   Title  Pt will be able to perform bil SLS for 10 sec or > without UE in order to demo improved core and functional strength so as to maximize stairs.     Baseline  5/15: can on Lt but still 5" on Rt    Time  3    Period  Weeks    Status  Partially Met        PT SHORT TERM GOAL #3   Title  Pt will have 151f improvement in 3MWT without a rest break and with 2/10 LBP/bil hip pain or < to demo improved functional strength and maximize her HH mobility.     Baseline  5/15:  distance about the same, however no rest break or pain.    Time  3    Period  Weeks    Status  Partially Met           PT Long Term Goals - 02/07/19 1208            PT LONG TERM GOAL #1   Title  Pt will have improved MMT by 1 grade throughout all deficient mm groups in order to further reduce LBP and maximize her functional mobility.    Time  6    Period  Weeks    Status  Partially Met        PT LONG TERM GOAL #2   Title  Pt will report being able to stand for 30 mins or > with 2/10 LBP/bil hip pain or < to allow her to perform light HH chores with greater ease.     Time  6    Period  Weeks    Status  Achieved        PT LONG TERM GOAL #3   Title  Pt will report participating in a regular walking program at least 2 days/week to demo  improved overall function, mobility, and return to PLOF.     Time  6    Period  Weeks    Status  Barista / Equipment: Pt educated on exercises for HEP throughout duration of closure and on ability to return to therapy if she feels a need.  Plan: Patient agrees to discharge.  Patient goals were partially met. Patient is being discharged due to meeting the stated rehab goals.  ?????      Kipp Brood, PT, DPT, [facility] Physical Therapist with [facility]  02/19/2019 9:38 AM     [place], [place], [address] Phone: [phone]   Fax:  [phone]

## 2019-03-03 ENCOUNTER — Other Ambulatory Visit: Payer: Self-pay

## 2019-03-03 ENCOUNTER — Other Ambulatory Visit (INDEPENDENT_AMBULATORY_CARE_PROVIDER_SITE_OTHER): Payer: Self-pay | Admitting: Orthopaedic Surgery

## 2019-03-03 ENCOUNTER — Inpatient Hospital Stay (HOSPITAL_COMMUNITY): Payer: Medicare Other | Attending: Hematology

## 2019-03-03 DIAGNOSIS — Z90710 Acquired absence of both cervix and uterus: Secondary | ICD-10-CM | POA: Insufficient documentation

## 2019-03-03 DIAGNOSIS — Z8543 Personal history of malignant neoplasm of ovary: Secondary | ICD-10-CM | POA: Diagnosis not present

## 2019-03-03 DIAGNOSIS — Z9221 Personal history of antineoplastic chemotherapy: Secondary | ICD-10-CM | POA: Diagnosis not present

## 2019-03-03 DIAGNOSIS — Z923 Personal history of irradiation: Secondary | ICD-10-CM | POA: Diagnosis not present

## 2019-03-03 DIAGNOSIS — Z9079 Acquired absence of other genital organ(s): Secondary | ICD-10-CM | POA: Insufficient documentation

## 2019-03-03 DIAGNOSIS — Z862 Personal history of diseases of the blood and blood-forming organs and certain disorders involving the immune mechanism: Secondary | ICD-10-CM | POA: Diagnosis not present

## 2019-03-03 DIAGNOSIS — Z85048 Personal history of other malignant neoplasm of rectum, rectosigmoid junction, and anus: Secondary | ICD-10-CM | POA: Insufficient documentation

## 2019-03-03 DIAGNOSIS — C210 Malignant neoplasm of anus, unspecified: Secondary | ICD-10-CM

## 2019-03-03 DIAGNOSIS — F17210 Nicotine dependence, cigarettes, uncomplicated: Secondary | ICD-10-CM | POA: Diagnosis not present

## 2019-03-03 DIAGNOSIS — C569 Malignant neoplasm of unspecified ovary: Secondary | ICD-10-CM

## 2019-03-03 LAB — COMPREHENSIVE METABOLIC PANEL
ALT: 23 U/L (ref 0–44)
AST: 33 U/L (ref 15–41)
Albumin: 4 g/dL (ref 3.5–5.0)
Alkaline Phosphatase: 46 U/L (ref 38–126)
Anion gap: 13 (ref 5–15)
BUN: 14 mg/dL (ref 8–23)
CO2: 25 mmol/L (ref 22–32)
Calcium: 9.4 mg/dL (ref 8.9–10.3)
Chloride: 101 mmol/L (ref 98–111)
Creatinine, Ser: 0.67 mg/dL (ref 0.44–1.00)
GFR calc Af Amer: >60 mL/min (ref >60)
GFR calc non Af Amer: >60 mL/min (ref >60)
Glucose, Bld: 93 mg/dL (ref 70–99)
Potassium: 4.3 mmol/L (ref 3.5–5.1)
Sodium: 139 mmol/L (ref 135–145)
Total Bilirubin: 0.4 mg/dL (ref 0.3–1.2)
Total Protein: 6.6 g/dL (ref 6.5–8.1)

## 2019-03-03 LAB — CBC WITH DIFFERENTIAL/PLATELET
Abs Immature Granulocytes: 0.02 10*3/uL (ref 0.00–0.07)
Basophils Absolute: 0 10*3/uL (ref 0.0–0.1)
Basophils Relative: 1 %
Eosinophils Absolute: 0.1 10*3/uL (ref 0.0–0.5)
Eosinophils Relative: 3 %
HCT: 39.4 % (ref 36.0–46.0)
Hemoglobin: 13 g/dL (ref 12.0–15.0)
Immature Granulocytes: 1 %
Lymphocytes Relative: 24 %
Lymphs Abs: 1 10*3/uL (ref 0.7–4.0)
MCH: 31.5 pg (ref 26.0–34.0)
MCHC: 33 g/dL (ref 30.0–36.0)
MCV: 95.4 fL (ref 80.0–100.0)
Monocytes Absolute: 0.5 10*3/uL (ref 0.1–1.0)
Monocytes Relative: 12 %
Neutro Abs: 2.4 10*3/uL (ref 1.7–7.7)
Neutrophils Relative %: 59 %
Platelets: 166 10*3/uL (ref 150–400)
RBC: 4.13 MIL/uL (ref 3.87–5.11)
RDW: 13.5 % (ref 11.5–15.5)
WBC: 3.9 10*3/uL — ABNORMAL LOW (ref 4.0–10.5)
nRBC: 0 % (ref 0.0–0.2)

## 2019-03-03 LAB — FERRITIN: Ferritin: 36 ng/mL (ref 11–307)

## 2019-03-03 LAB — LACTATE DEHYDROGENASE: LDH: 165 U/L (ref 98–192)

## 2019-03-03 NOTE — Telephone Encounter (Signed)
Sorry , unable to continue to refill this medication.

## 2019-03-03 NOTE — Telephone Encounter (Signed)
Please advise 

## 2019-03-04 LAB — CEA: CEA: 7.5 ng/mL — ABNORMAL HIGH (ref 0.0–4.7)

## 2019-03-04 LAB — CA 125: Cancer Antigen (CA) 125: 9.4 U/mL (ref 0.0–38.1)

## 2019-03-05 ENCOUNTER — Telehealth: Payer: Self-pay | Admitting: Orthopaedic Surgery

## 2019-03-05 NOTE — Progress Notes (Signed)
For review.  Please update ordering provider

## 2019-03-05 NOTE — Telephone Encounter (Signed)
Patient called concerning her medication refill (Tramadol) Patient asked for a call back as soon as possible. The number to contact patient is [phone]

## 2019-03-05 NOTE — Telephone Encounter (Signed)
Patient called regarding refill on Tramadol.  She states that she is out and still needs the medication. She just completed physical therapy. This has helped to make her stronger, but she still is unable to walk for very long. She continues to do the exercises at home and is working through the pain.   Please advise on refill. Would you like to see patient back in the office?

## 2019-03-05 NOTE — Telephone Encounter (Signed)
I called discussed. FYI

## 2019-03-07 ENCOUNTER — Other Ambulatory Visit: Payer: Self-pay

## 2019-03-10 ENCOUNTER — Encounter (HOSPITAL_COMMUNITY): Payer: Self-pay | Admitting: Hematology

## 2019-03-10 ENCOUNTER — Inpatient Hospital Stay (HOSPITAL_BASED_OUTPATIENT_CLINIC_OR_DEPARTMENT_OTHER): Payer: Medicare Other | Admitting: Hematology

## 2019-03-10 ENCOUNTER — Other Ambulatory Visit: Payer: Self-pay

## 2019-03-10 VITALS — BP 136/75 | HR 81 | Temp 98.1°F | Resp 18 | Wt 165.7 lb

## 2019-03-10 DIAGNOSIS — Z9221 Personal history of antineoplastic chemotherapy: Secondary | ICD-10-CM

## 2019-03-10 DIAGNOSIS — Z8543 Personal history of malignant neoplasm of ovary: Secondary | ICD-10-CM | POA: Diagnosis not present

## 2019-03-10 DIAGNOSIS — Z90710 Acquired absence of both cervix and uterus: Secondary | ICD-10-CM

## 2019-03-10 DIAGNOSIS — F17210 Nicotine dependence, cigarettes, uncomplicated: Secondary | ICD-10-CM

## 2019-03-10 DIAGNOSIS — C210 Malignant neoplasm of anus, unspecified: Secondary | ICD-10-CM

## 2019-03-10 DIAGNOSIS — Z923 Personal history of irradiation: Secondary | ICD-10-CM | POA: Diagnosis not present

## 2019-03-10 DIAGNOSIS — C569 Malignant neoplasm of unspecified ovary: Secondary | ICD-10-CM

## 2019-03-10 DIAGNOSIS — Z862 Personal history of diseases of the blood and blood-forming organs and certain disorders involving the immune mechanism: Secondary | ICD-10-CM | POA: Diagnosis not present

## 2019-03-10 DIAGNOSIS — Z85048 Personal history of other malignant neoplasm of rectum, rectosigmoid junction, and anus: Secondary | ICD-10-CM

## 2019-03-10 DIAGNOSIS — Z9079 Acquired absence of other genital organ(s): Secondary | ICD-10-CM

## 2019-03-10 MED ORDER — HEPARIN SOD (PORK) LOCK FLUSH 100 UNIT/ML IV SOLN
500.0000 [IU] | Freq: Once | INTRAVENOUS | Status: AC
  Administered 2019-03-10: 500 [IU] via INTRAVENOUS

## 2019-03-10 MED ORDER — SODIUM CHLORIDE 0.9% FLUSH
10.0000 mL | Freq: Once | INTRAVENOUS | Status: AC
  Administered 2019-03-10: 10 mL via INTRAVENOUS

## 2019-03-10 NOTE — Progress Notes (Signed)
[place], [place] [address]   CLINIC:  Medical Oncology/Hematology  PCP:  Susy Frizzle, MD [address] [place] [address] [phone]   REASON FOR VISIT:  Follow-up for anal cancer and ovarian cancer.   BRIEF ONCOLOGIC HISTORY:  Oncology History  History of ovarian cancer  06/02/2014 Procedure   Diagnostic laparoscopy, lysis of adhesions, biopsy of omental nodules, biopsy right ovary by Dr Dalbert Batman   06/04/2014 Pathology Results   Diagnosis 1. Omentum, biopsy, Anterior peritoneal & omental nodule - SEROUS CARCINOMA WITH ASSOCIATED ABUNDANT PSAMMOMA BODIES AND DESMOPLASTIC STROMAL REACTION CONSISTENT WITH INVASIVE IMPLANTS. - SEE COMMENT. 2. Omentum, biopsy, Omental nodule - SEROUS CARCINOMA WITH ASSOCIATED ABUNDANT PSAMMOMA BODIES AND DESMOPLASTIC STROMAL REACTION CONSISTENT WITH INVASIVE IMPLANTS. - SEE COMMENT. 3. Ovary, biopsy/wedge resection, Right ovary - SMALL FRAGMENTS OF ATYPICAL PAPILLARY PROLIFERATION WITH PSAMMOMA BODIES CONSISTENT WITH AT LEAST SEROUS BORDERLINE TUMOR.   07/03/2014 Procedure   Exploratory laparotomy, omentectomy, Bilateral salpingo-oophorectomy, inptraperitoneal PORT placement by Dr. Alycia Rossetti       07/24/2014 - 12/07/2014 Chemotherapy   Carboplatin/Paclitaxel x 6 cycles    08/17/2014 Procedure   Insertion of 8 French power port clear view tunneled venous vascular access device next line use of fluoroscopy for guidance and positioning Use of ultrasound for venipuncture Removal of intraperitoneal chemotherapy port By Dr. Dalbert Batman   05/14/2017 Genetic Testing   Negative genetic testing on the [facility] panel.  The [facility] gene panel offered by [employer] includes sequencing and deletion/duplication testing of the following 28 genes: APC, ATM, BARD1, BMPR1A, BRCA1, BRCA2, BRIP1, CHD1, CDK4, CDKN2A, CHEK2, EPCAM (large rearrangement only), MLH1, MSH2, MSH6, MUTYH, NBN, PALB2,  PMS2, PTEN, RAD51C, RAD51D, SMAD4, STK11, and TP53. Sequencing was performed for select regions of POLE and POLD1, and large rearrangement analysis was performed for select regions of GREM1. The report date is April 27, 2017.  HRD testing was performed on the ovarian tumor.  The tumor was negative for any deleterious BRCA1 or BRCA2 mutations.    Genomic instability status was not interpretable.  Therefore, [facility] was unable to analyze the ovarian tumor for genomic instability.  The report date is May 14, 2017.    Anal cancer ([place])  09/09/2015 Procedure   Rectovaginal septal mass biopsy by Dr. Marcello Moores   09/10/2015 Pathology Results   Soft tissue mass, biopsy, rectovaginal septal mass - SQUAMOUS CELL CARCINOMA.   10/15/2015 PET scan   1. Focal hypermetabolism with associated ill-defined soft tissue fullness at the junction of the anterior anal wall and posterior inferior vaginal wall, in keeping with the provided history of a primary anal carcinoma. 2. No hypermetabolic locoregional or distant metastatic disease. 3. Status post hysterectomy, with no abnormal findings at the vaginal cuff. No evidence of hypermetabolic peritoneal tumor. 4. Tiny nonobstructing bilateral renal stones.   10/25/2015 - 12/06/2015 Radiation Therapy   [place], [place]   10/25/2015 - 12/06/2015 Chemotherapy   Mitomycin C and 5FU, by Dr. Jacquiline Doe    06/15/2016 Procedure   EXCISION RECTOVAGINAOL FISTULA WITH MUCOSAL ADVANCEMENT FLAP by Dr. Marcello Moores   06/18/2016 Pathology Results   Fistula, rectovaginal SQUAMOUS, COLUMNAR JUNCTION MUCOSA AND SUBCUTANOUS SOFT TISSUE WITH FISTULA NO EVIDENCE OF MALIGNANCY   09/20/2016 Imaging   CT CAP- 1. No acute findings and no evidence for recurrent tumor or metastatic disease. 2. Aortic atherosclerosis and coronary artery calcification      CANCER STAGING: Cancer Staging Anal cancer ([place]) Staging form: Anus, AJCC  8th Edition - Clinical: Stage IIA (cT2, cN0, cM0) -  Signed by Baird Cancer, PA-C on 09/27/2016    INTERVAL HISTORY:  Ms. Annett 68 y.o. female seen for follow-up of anal cancer and ovarian cancer.  Denies any change in bowel habits.  Denies any bleeding per rectum or melena.  Denies any abdominal pain or discomfort.  No nausea, vomiting, diarrhea or constipation reported.  No infections or hospitalizations reported.  She has a Port-A-Cath which does not give her any problems.   REVIEW OF SYSTEMS:  Review of Systems  All other systems reviewed and are negative.    PAST MEDICAL/SURGICAL HISTORY:  Past Medical History:  Diagnosis Date   Allergy    Anal carcinoma [facility]) oncologist-  dr Whitney Muse ([facility])   dx 12/ 2016 [facility] --  chemo and radiation- Dr. Marcello Moores   Anemia    due to her cancer   Anxiety    Dr. Harrington Challenger at [place] (psychiatrist).     Carcinoma of ovary, stage 3 [facility]) oncologist-  dr gehrig/  dr Doreene Eland (cancer center in [place]/ [facility])--  no recurrency   dx 09/ 2015  Stage IIIB  papillary ovarian carcinoma  s/p  omentectomy and BSO &  chemotherapy (completed 12-07-2014)   Chemotherapy induced nausea and vomiting 10/23/2014   Chronic kidney disease    stones   DDD (degenerative disc disease), lumbosacral    GERD (gastroesophageal reflux disease)    Headache(784.0)    History of acute respiratory failure    05-30-2009  drug overdose-- (intubated for 2 days)  and aspiration pneumonia   History of adenomatous polyp of colon 10/07/2015   tubular adenoma high grade dysplasia   History of herpes genitalis    History of kidney stones    History of ovarian cancer 08/03/2015   History of suicide attempt    per documentation in epic  05-30-2009  overdose benaodiazepine   HTN (hypertension)    takes Metoprolol daily   Hyperlipidemia    Hypertension 09/27/2016   IBS (irritable bowel syndrome)    takes Bentyl daily   Internal carotid artery stenosis, left    <50%, ulcerative plaque at carotid bulb.     Major depression    Mood disorder (HCC)    OA (osteoarthritis)    both hips   Ovarian cancer ([place])    2015-TAH/BSO   Prolapse of vaginal vault after hysterectomy 07/20/2015   Rectovaginal fistula 08/05/2015   Sigmoid diverticulosis    Smokers' cough [facility])    Past Surgical History:  Procedure Laterality Date   [place]   COLONOSCOPY N/A 10/07/2015   Procedure: COLONOSCOPY;  Surgeon: Rogene Houston, MD;  [facility];  Service: Endoscopy;  Laterality: N/A;  7:30   EXPLORATORY LAPAROTOMY/ OMENTECTOMY/  BILATERAL SALPINGOOPHORECTOMY/  PORT-A-CATH PLACEMENT  07-03-2014   [place]   KNEE ARTHROSCOPY Left 09-22-2004   LAPAROSCOPY N/A 06/02/2014   Procedure: DIAGNOSTIC LAPAROSCOPY, OMENTAL BIOPSY, RIGHT OVARY BIOPSY, LYSIS OF ADHESIONS;  Surgeon: Fanny Skates, MD;  Location: [place] [facility];  Service: General;  Laterality: N/A;   MUCOSAL ADVANCEMENT FLAP N/A 06/15/2016   Procedure: EXCISION RECTOVAGINAOL FISTULA WITH MUCOSAL ADVANCEMENT FLAP;  Surgeon: Leighton Ruff, MD;  Location: [place];  Service: General;  Laterality: N/A;   PLACEMENT OF SETON N/A 09/09/2015   Procedure: PLACEMENT OF SETON;  Surgeon: Leighton Ruff, MD;  Location: [place];  Service: General;  Laterality:  N/A;   PORT-A-CATH REMOVAL Right 08/17/2014   Procedure: REMOVAL INTRAPERITONEAL CHEMO PORT;  Surgeon: Fanny Skates, MD;  Location: [place];  Service: General;  Laterality: Right;   PORTACATH PLACEMENT Right 08/17/2014   Procedure:  PLACE NEW PORT A CATH;  Surgeon: Fanny Skates, MD;  Location: [place];  Service: General;  Laterality: Right;   RECTAL BIOPSY N/A 09/09/2015   Procedure: BIOPSY OF RECTOVAGINAL MASS;  Surgeon: Leighton Ruff, MD;  Location: [place];  Service: General;  Laterality: N/A;   TUBAL LIGATION  YRS AGO   VAGINAL HYSTERECTOMY  1981    fibroids     SOCIAL HISTORY:  Social History   Socioeconomic History   Marital status: Married    Spouse name: Coralyn Mark   Number of children: 3   Years of education: 12   Highest education level: Not on file  Occupational History   Occupation: retire    Comment: bell Therapist, 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   Food insecurity    Worry: Not on file    Inability: Not on Lexicographer needs    Medical: Not on file    Non-medical: Not on file  Tobacco Use   Smoking status: Current Every Day Smoker    Packs/day: 0.50    Years: 43.00    Pack years: 21.50    Types: Cigarettes   Smokeless tobacco: Never Used  Substance and Sexual Activity   Alcohol use: No   Drug use: No   Sexual activity: Not Currently    Birth control/protection: Surgical  Lifestyle   Physical activity    Days per week: Not on file    Minutes per session: Not on file   Stress: Not on file  Relationships   Social connections    Talks on phone: Not on file    Gets together: Not on file    Attends religious service: Not on file    Active member of club or organization: Not on file    Attends meetings of clubs or organizations: Not on file    Relationship status: Not on file   Intimate partner violence    Fear of current or ex partner: Not on file    Emotionally abused: Not on file    Physically abused: Not on file    Forced sexual activity: Not on file  Other Topics Concern   Not on file  Social History Narrative   Lives at home with Coralyn Mark   Retired [employer]:  Family History  Problem Relation Age of Onset   Bipolar disorder Mother    Anxiety disorder Mother    Dementia Mother    Depression Mother    Mental illness Mother    Vision loss Mother    Alzheimer's disease Mother    Alcohol abuse Paternal Uncle    Bipolar disorder Maternal Grandmother    Dementia Maternal Grandmother    Alzheimer's disease Maternal  Grandmother    Alcohol abuse Paternal Uncle    Stroke Brother    Deep vein thrombosis Son    Heart disease Father    Hyperlipidemia Father    Hypertension Father    Stroke Father    Vision loss Father    Atrial fibrillation Sister    Heart disease Brother    Heart disease Brother    ADD / ADHD Neg Hx    Drug abuse Neg Hx  OCD Neg Hx    Paranoid behavior Neg Hx    Schizophrenia Neg Hx    Seizures Neg Hx    Sexual abuse Neg Hx    Physical abuse Neg Hx     CURRENT MEDICATIONS:  Outpatient Encounter Medications as of 03/10/2019  Medication Sig   b complex vitamins tablet Take 1 tablet by mouth daily.   Coenzyme Q10 (CO Q 10) 10 MG CAPS Take by mouth daily.   divalproex (DEPAKOTE ER) 500 MG 24 hr tablet Take 1 tablet (500 mg total) by mouth daily.   escitalopram (LEXAPRO) 20 MG tablet Take 1 tablet (20 mg total) by mouth 2 (two) times daily.   ketoconazole (NIZORAL) 2 % cream Apply 1 application topically as needed. prn    KRILL OIL PO Take 350 mg by mouth daily.   LORazepam (ATIVAN) 1 MG tablet Take 1 tablet (1 mg total) by mouth 3 (three) times daily as needed for anxiety.   losartan (COZAAR) 50 MG tablet Take 1 tablet (50 mg total) by mouth daily.   meloxicam (MOBIC) 15 MG tablet Take 1 tablet (15 mg total) by mouth daily.   Multiple Vitamin (MULTIVITAMIN) tablet Take 1 tablet by mouth daily.   rosuvastatin (CRESTOR) 20 MG tablet TAKE 1 TABLET BY MOUTH EVERY DAY   valACYclovir (VALTREX) 1000 MG tablet Take 1 tablet (1,000 mg total) by mouth daily as needed.   furosemide (LASIX) 20 MG tablet Take 1 tablet (20 mg total) by mouth daily as needed. (Patient not taking: Reported on 03/10/2019)   lansoprazole (PREVACID) 15 MG capsule Take 15 mg by mouth as needed.    traMADol (ULTRAM) 50 MG tablet TAKE ONE TABLET EVERY 12 HOURS AS NEEDED FOR PAIN (Patient not taking: Reported on 03/10/2019)   [EXPIRED] heparin lock flush 100 unit/mL    [EXPIRED]  sodium chloride flush (NS) 0.9 % injection 10 mL    No facility-administered encounter medications on file as of 03/10/2019.     ALLERGIES:  No Known Allergies   PHYSICAL EXAM:  ECOG Performance status: 1  Vitals:   03/10/19 1427  BP: 136/75  Pulse: 81  Resp: 18  Temp: 98.1 F (36.7 C)  SpO2: 98%   Filed Weights   03/10/19 1427  Weight: 165 lb 11.2 oz (75.2 kg)    Physical Exam Vitals signs reviewed.  Constitutional:      Appearance: Normal appearance.  Cardiovascular:     Rate and Rhythm: Normal rate and regular rhythm.     Heart sounds: Normal heart sounds.  Pulmonary:     Effort: Pulmonary effort is normal.     Breath sounds: Normal breath sounds.  Abdominal:     General: There is no distension.     Palpations: Abdomen is soft. There is no mass.  Musculoskeletal:        General: No swelling.  Skin:    General: Skin is warm.  Neurological:     General: No focal deficit present.     Mental Status: She is alert and oriented to person, place, and time.  Psychiatric:        Mood and Affect: Mood normal.        Behavior: Behavior normal.      LABORATORY DATA:  I have reviewed the labs as listed.  CBC    Component Value Date/Time   WBC 3.9 (L) 03/03/2019 1135   RBC 4.13 03/03/2019 1135   HGB 13.0 03/03/2019 1135   HCT 39.4 03/03/2019 1135  PLT 166 03/03/2019 1135   MCV 95.4 03/03/2019 1135   MCH 31.5 03/03/2019 1135   MCHC 33.0 03/03/2019 1135   RDW 13.5 03/03/2019 1135   LYMPHSABS 1.0 03/03/2019 1135   MONOABS 0.5 03/03/2019 1135   EOSABS 0.1 03/03/2019 1135   BASOSABS 0.0 03/03/2019 1135   CMP Latest Ref Rng & Units 03/03/2019 01/01/2019 08/30/2018  Glucose 70 - 99 mg/dL 93 108(H) 97  BUN 8 - 23 mg/dL '14 18 16  '$ Creatinine 0.44 - 1.00 mg/dL 0.67 0.77 0.74  Sodium 135 - 145 mmol/L 139 143 139  Potassium 3.5 - 5.1 mmol/L 4.3 4.7 3.9  Chloride 98 - 111 mmol/L 101 107 104  CO2 22 - 32 mmol/L '25 26 27  '$ Calcium 8.9 - 10.3 mg/dL 9.4 9.3 8.7(L)  Total  Protein 6.5 - 8.1 g/dL 6.6 6.5 6.6  Total Bilirubin 0.3 - 1.2 mg/dL 0.4 0.2 0.3  Alkaline Phos 38 - 126 U/L 46 - 58  AST 15 - 41 U/L 33 36(H) 35  ALT 0 - 44 U/L '23 22 20       '$ DIAGNOSTIC IMAGING:  I have independently reviewed the scans and discussed with the patient.   I have reviewed Venita Lick LPN's note and agree with the documentation.  I personally performed a face-to-face visit, made revisions and my assessment and plan is as follows.    ASSESSMENT & PLAN:   Anal cancer ([place]) 1.  Stage II (T2 N0 M0) anal cancer: -Diagnosed in December 2016. -Status post chemoradiation therapy with 5-FU and mitomycin-C finished on 12/06/2015.  She was treated at [facility] - She had a follow-up appointment with Dr. Marcello Moores 3 months ago for anoscopy which was reportedly normal. -She denies any bleeding per rectum or melena. -Physical exam today did not reveal any inguinal adenopathy.  No other suspicious physical exam findings today. -We will see her back in 6 months for follow-up with repeat blood work and physical exam.  2.  Stage III ovarian cancer: - Diagnosed in 2015, status post debulking surgery. -She was treated with 6 cycles of carboplatin and paclitaxel completed in March 2016. -Does not report any bloating sensation or abdominal discomfort/pain. - We have reviewed her blood work which shows Ca1 25 level of 9.4. -We will see her back in 6 months with repeat blood work and physical exam.      Orders placed this encounter:  Orders Placed This Encounter  Procedures   CBC with Differential/Platelet   Comprehensive metabolic panel   Iron and TIBC   Ferritin   CA 125   CEA      Derek Jack, MD [place] [phone]

## 2019-03-10 NOTE — Progress Notes (Signed)
Patients port flushed without difficulty.  Good blood return noted before and after flush. No complaints of pain with flush and no bruising or swelling noted at site.  Band aid applied.  VSS with discharge and left ambulatory with no s/s of distress noted.  

## 2019-03-10 NOTE — Patient Instructions (Addendum)
[facility] Discharge Instructions  You were seen today by Dr. Katragadda. He went over your recent lab results. He will see you back in 6 months for labs and follow up.   Thank you for choosing [facility] at [facility] to provide your oncology and hematology care.  To afford each patient quality time with our provider, please arrive at least 15 minutes before your scheduled appointment time.   If you have a lab appointment with the Cancer Center please come in thru the  Main Entrance and check in at the main information desk  You need to re-schedule your appointment should you arrive 10 or more minutes late.  We strive to give you quality time with our providers, and arriving late affects you and other patients whose appointments are after yours.  Also, if you no show three or more times for appointments you may be dismissed from the clinic at the providers discretion.     Again, thank you for choosing [facility].  Our hope is that these requests will decrease the amount of time that you wait before being seen by our physicians.       _____________________________________________________________  Should you have questions after your visit to [facility], please contact our office at [phone] between the hours of 8:00 a.m. and 4:30 p.m.  Voicemails left after 4:00 p.m. will not be returned until the following business day.  For prescription refill requests, have your pharmacy contact our office and allow 72 hours.    Cancer Center Support Programs:   > Cancer Support Group  2nd Tuesday of the month 1pm-2pm, Journey Room    

## 2019-03-10 NOTE — Assessment & Plan Note (Signed)
1.  Stage II (T2 N0 M0) anal cancer: -Diagnosed in December 2016. -Status post chemoradiation therapy with 5-FU and mitomycin-C finished on 12/06/2015.  She was treated at [facility]. - She had a follow-up appointment with Dr. Marcello Moores 3 months ago for anoscopy which was reportedly normal. -She denies any bleeding per rectum or melena. -Physical exam today did not reveal any inguinal adenopathy.  No other suspicious physical exam findings today. -We will see her back in 6 months for follow-up with repeat blood work and physical exam.  2.  Stage III ovarian cancer: - Diagnosed in 2015, status post debulking surgery. -She was treated with 6 cycles of carboplatin and paclitaxel completed in March 2016. -Does not report any bloating sensation or abdominal discomfort/pain. - We have reviewed her blood work which shows Ca1 25 level of 9.4. -We will see her back in 6 months with repeat blood work and physical exam.

## 2019-03-31 ENCOUNTER — Other Ambulatory Visit: Payer: Self-pay | Admitting: Family Medicine

## 2019-04-04 ENCOUNTER — Encounter: Payer: Self-pay | Admitting: Family Medicine

## 2019-04-04 ENCOUNTER — Ambulatory Visit (INDEPENDENT_AMBULATORY_CARE_PROVIDER_SITE_OTHER): Payer: Medicare Other | Admitting: Family Medicine

## 2019-04-04 ENCOUNTER — Other Ambulatory Visit: Payer: Self-pay

## 2019-04-04 VITALS — BP 142/80 | HR 84 | Temp 98.4°F | Resp 16 | Ht 65.0 in | Wt 166.0 lb

## 2019-04-04 DIAGNOSIS — M545 Low back pain: Secondary | ICD-10-CM | POA: Diagnosis not present

## 2019-04-04 DIAGNOSIS — M5136 Other intervertebral disc degeneration, lumbar region: Secondary | ICD-10-CM | POA: Diagnosis not present

## 2019-04-04 DIAGNOSIS — G8929 Other chronic pain: Secondary | ICD-10-CM | POA: Diagnosis not present

## 2019-04-04 MED ORDER — HYDROCODONE-ACETAMINOPHEN 5-325 MG PO TABS: 1.0000 | ORAL_TABLET | Freq: Four times a day (QID) | ORAL | 0 refills | Status: DC | PRN

## 2019-04-04 NOTE — Progress Notes (Signed)
Subjective:    Patient ID: Abigail Wagner, female    DOB: 01-10-1951, 68 y.o.   MRN: [id number]  HPI   Patient is a very pleasant 68 year old Caucasian female who has a very complicated past medical history who has a history of stage III ovarian cancer with metastasis to the omentum.  This was surgically resected with a bilateral salpingo-oophorectomy.  Patient had previously had a partial hysterectomy.  She is currently followed with periodic imaging as well as a Ca125 at the [facility].  She also has a history of stage II anal cancer.  She sees a Dr. Marcello Moores who performs periodic endoscopies usually every 6 months according to the patient.    She has been seeing her orthopedist for low back pain and has been receiving tramadol.  Unfortunately, the orthopedist says that there is nothing that they can do surgically to help her low back pain.  Physical therapy has not been beneficial.  Her most recent MRI from February is dictated below:  FINDINGS: Segmentation:  Normal  Alignment:  . mild retrolisthesis L1-2.  Mild anterolisthesis L3-4.  Vertebrae: Negative for fracture or mass. No significant bone marrow edema.  Conus medullaris and cauda equina: Conus extends to the T12-L1 level. Conus and cauda equina appear normal.  Paraspinal and other soft tissues: Negative for paraspinous mass or fluid collection.  Disc levels:  L1-2: Disc degeneration with disc bulging and mild spurring. No significant stenosis. Mild facet degeneration.  L2-3: Moderate disc degeneration with disc space narrowing and spurring. Right-sided disc protrusion and osteophyte is unchanged causing subarticular stenosis on the right and mild to moderate right foraminal encroachment. Bilateral facet hypertrophy. Spinal canal not stenotic.  L3-4: Disc degeneration. Small extruded disc fragment on the right extending cranially into the foramen is unchanged. There is subarticular and foraminal  stenosis on the right which is unchanged. Possible right L3 and L4 nerve root impingement. Moderate to advanced facet degeneration with mild spinal stenosis  L4-5: Disc degeneration and spurring on the left with left-sided facet hypertrophy causing subarticular and foraminal encroachment on the left. This is moderately severe and unchanged from the prior study  L5-S1: Disc and facet degeneration.  No significant spinal stenosis.   He has referred her back to me to help manage her chronic pain in her back.  Pain is located primarily in her lower back between L3 and L5.  It is made worse by prolonged standing and walking such as when she shops or buys groceries.  Is better after sitting and resting.  She has been using tramadol once a day or every other day when the pain is severe to help dull the pain.  However she is taking an extremely high dose of Lexapro 20 mg twice daily and therefore I am somewhat concerned about possible serotonin syndrome.  We discussed this and I would not like to continue tramadol long-term in this patient.  She has tolerated hydrocodone in the past without difficulty   Past Surgical History:  Procedure Laterality Date  . BREAST ENHANCEMENT SURGERY  1986  . [place]  . COLONOSCOPY N/A 10/07/2015   Procedure: COLONOSCOPY;  Surgeon: Rogene Houston, MD;  [facility];  Service: Endoscopy;  Laterality: N/A;  7:30  . EXPLORATORY LAPAROTOMY/ OMENTECTOMY/  BILATERAL SALPINGOOPHORECTOMY/  [facility] PLACEMENT  07-03-2014   [facility]  . KNEE ARTHROSCOPY Left 09-22-2004  . LAPAROSCOPY N/A 06/02/2014   Procedure: DIAGNOSTIC LAPAROSCOPY, OMENTAL BIOPSY, RIGHT OVARY BIOPSY,  LYSIS OF ADHESIONS;  Surgeon: Fanny Skates, MD;  Location: [place];  Service: General;  Laterality: N/A;  . MUCOSAL ADVANCEMENT FLAP N/A 06/15/2016   Procedure: EXCISION RECTOVAGINAOL FISTULA WITH MUCOSAL ADVANCEMENT FLAP;  Surgeon: Leighton Ruff, MD;  Location: [place];  Service: General;  Laterality: N/A;  . PLACEMENT OF SETON N/A 09/09/2015   Procedure: PLACEMENT OF SETON;  Surgeon: Leighton Ruff, MD;  Location: [place];  Service: General;  Laterality: N/A;  . PORT-A-CATH REMOVAL Right 08/17/2014   Procedure: REMOVAL INTRAPERITONEAL CHEMO PORT;  Surgeon: Fanny Skates, MD;  Location: [place];  Service: General;  Laterality: Right;  . PORTACATH PLACEMENT Right 08/17/2014   Procedure:  PLACE NEW PORT A CATH;  Surgeon: Fanny Skates, MD;  Location: [place];  Service: General;  Laterality: Right;  . RECTAL BIOPSY N/A 09/09/2015   Procedure: BIOPSY OF RECTOVAGINAL MASS;  Surgeon: Leighton Ruff, MD;  Location: [place];  Service: General;  Laterality: N/A;  . TUBAL LIGATION  YRS AGO  . VAGINAL HYSTERECTOMY  1981   fibroids   Current Outpatient Medications on File Prior to Visit  Medication Sig Dispense Refill  . b complex vitamins tablet Take 1 tablet by mouth daily.    . Coenzyme Q10 (CO Q 10) 10 MG CAPS Take by mouth daily.    . divalproex (DEPAKOTE ER) 500 MG 24 hr tablet Take 1 tablet (500 mg total) by mouth daily. 90 tablet 3  . escitalopram (LEXAPRO) 20 MG tablet Take 1 tablet (20 mg total) by mouth 2 (two) times daily. 90 tablet 3  . HORSE CHESTNUT PO Take by mouth.    Marland Kitchen ketoconazole (NIZORAL) 2 % cream Apply 1 application topically as needed. prn     . KRILL OIL PO Take 350 mg by mouth daily.    . lansoprazole (PREVACID) 15 MG capsule Take 15 mg by mouth as needed.     Marland Kitchen LORazepam (ATIVAN) 1 MG tablet Take 1 tablet (1 mg total) by mouth 3 (three) times daily as needed for anxiety. 90 tablet 3  . losartan (COZAAR) 50 MG tablet Take 1 tablet (50 mg total) by mouth daily. 90 tablet 3  . meloxicam (MOBIC) 15 MG tablet TAKE 1 TABLET BY MOUTH EVERY DAY 30 tablet 2  . Multiple Vitamin (MULTIVITAMIN) tablet Take 1 tablet by mouth daily.    . rosuvastatin (CRESTOR) 20 MG  tablet TAKE 1 TABLET BY MOUTH EVERY DAY 90 tablet 1  . valACYclovir (VALTREX) 1000 MG tablet Take 1 tablet (1,000 mg total) by mouth daily as needed. 5 tablet 11   No current facility-administered medications on file prior to visit.    No Known Allergies Social History   Socioeconomic History  . Marital status: Married    Spouse name: Coralyn Mark  . Number of children: 3  . Years of education: 67  . Highest education level: Not on file  Occupational History  . Occupation: retire    Comment: Animal nutritionist  . Financial resource strain: Not on file  . Food insecurity    Worry: Not on file    Inability: Not on file  . Transportation needs    Medical: Not on file    Non-medical: Not on file  Tobacco Use  . Smoking status: Current Every Day Smoker    Packs/day: 0.50    Years: 43.00    Pack years: 21.50    Types: Cigarettes  .  Smokeless tobacco: Never Used  Substance and Sexual Activity  . Alcohol use: No  . Drug use: No  . Sexual activity: Not Currently    Birth control/protection: Surgical  Lifestyle  . Physical activity    Days per week: Not on file    Minutes per session: Not on file  . Stress: Not on file  Relationships  . Social Herbalist on phone: Not on file    Gets together: Not on file    Attends religious service: Not on file    Active member of club or organization: Not on file    Attends meetings of clubs or organizations: Not on file    Relationship status: Not on file  . Intimate partner violence    Fear of current or ex partner: Not on file    Emotionally abused: Not on file    Physically abused: Not on file    Forced sexual activity: Not on file  Other Topics Concern  . Not on file  Social History Narrative   Lives at home with Coralyn Mark   Retired [employer]   Family History  Problem Relation Age of Onset  . Bipolar disorder Mother   . Anxiety disorder Mother   . Dementia Mother   . Depression Mother   . Mental illness Mother    . Vision loss Mother   . Alzheimer's disease Mother   . Alcohol abuse Paternal Uncle   . Bipolar disorder Maternal Grandmother   . Dementia Maternal Grandmother   . Alzheimer's disease Maternal Grandmother   . Alcohol abuse Paternal Uncle   . Stroke Brother   . Deep vein thrombosis Son   . Heart disease Father   . Hyperlipidemia Father   . Hypertension Father   . Stroke Father   . Vision loss Father   . Atrial fibrillation Sister   . Heart disease Brother   . Heart disease Brother   . ADD / ADHD Neg Hx   . Drug abuse Neg Hx   . OCD Neg Hx   . Paranoid behavior Neg Hx   . Schizophrenia Neg Hx   . Seizures Neg Hx   . Sexual abuse Neg Hx   . Physical abuse Neg Hx       Review of Systems  All other systems reviewed and are negative.      Objective:   Physical Exam Vitals signs reviewed.  Constitutional:      General: She is not in acute distress.    Appearance: She is well-developed. She is not diaphoretic.  HENT:     Mouth/Throat:     Pharynx: No oropharyngeal exudate.  Eyes:     General: No scleral icterus.       Right eye: No discharge.        Left eye: No discharge.     Conjunctiva/sclera: Conjunctivae normal.     Pupils: Pupils are equal, round, and reactive to light.  Neck:     Thyroid: No thyromegaly.     Vascular: No JVD.     Trachea: No tracheal deviation.  Cardiovascular:     Rate and Rhythm: Normal rate and regular rhythm.     Pulses:          Carotid pulses are on the left side with bruit.    Heart sounds: Normal heart sounds. No murmur. No friction rub. No gallop.   Pulmonary:     Effort: Pulmonary effort is normal. No respiratory distress.  Breath sounds: Normal breath sounds. No stridor. No wheezing or rales.  Chest:     Chest wall: No tenderness.  Musculoskeletal:     Lumbar back: She exhibits decreased range of motion, tenderness and pain.  Neurological:     Mental Status: She is alert.     Motor: No abnormal muscle tone.            Assessment & Plan:  The primary encounter diagnosis was DDD (degenerative disc disease), lumbar. A diagnosis of Chronic midline low back pain without sciatica was also pertinent to this visit. I do not want to continue the tramadol due to the risk of serotonin syndrome.  No more often than the patient is taking the medication I would be comfortable giving her Norco 5/325 1 p.o. every 6 hours as needed pain.  She is taking 1 tablet every day to every other day.  Therefore 30 tablets should last her at least 1 month and possibly 2 months.  If she is using it this sparingly I would be willing to continue this long-term.  Patient is comfortable with this plan.

## 2019-05-01 ENCOUNTER — Other Ambulatory Visit: Payer: Self-pay | Admitting: Family Medicine

## 2019-05-02 ENCOUNTER — Other Ambulatory Visit: Payer: Self-pay | Admitting: Family Medicine

## 2019-05-02 MED ORDER — HYDROCODONE-ACETAMINOPHEN 5-325 MG PO TABS: 1.0000 | ORAL_TABLET | Freq: Four times a day (QID) | ORAL | 0 refills | Status: DC | PRN

## 2019-05-02 NOTE — Telephone Encounter (Signed)
Patient is requesting a refill on Hydrocodone   LOV: 04/04/19  LRF:   04/04/19

## 2019-05-06 DIAGNOSIS — K130 Diseases of lips: Secondary | ICD-10-CM | POA: Diagnosis not present

## 2019-05-09 ENCOUNTER — Other Ambulatory Visit (HOSPITAL_COMMUNITY): Payer: Self-pay | Admitting: Psychiatry

## 2019-05-09 DIAGNOSIS — F331 Major depressive disorder, recurrent, moderate: Secondary | ICD-10-CM

## 2019-05-30 ENCOUNTER — Other Ambulatory Visit: Payer: Self-pay | Admitting: Family Medicine

## 2019-06-03 ENCOUNTER — Other Ambulatory Visit: Payer: Self-pay | Admitting: Family Medicine

## 2019-06-03 MED ORDER — HYDROCODONE-ACETAMINOPHEN 5-325 MG PO TABS: 1.0000 | ORAL_TABLET | Freq: Four times a day (QID) | ORAL | 0 refills | Status: DC | PRN

## 2019-06-03 NOTE — Telephone Encounter (Signed)
Last filled 05/02/2019 Last office visit: 04/04/2019

## 2019-06-09 ENCOUNTER — Other Ambulatory Visit: Payer: Self-pay

## 2019-06-09 ENCOUNTER — Inpatient Hospital Stay (HOSPITAL_COMMUNITY): Payer: Medicare Other | Attending: Hematology

## 2019-06-09 DIAGNOSIS — Z85048 Personal history of other malignant neoplasm of rectum, rectosigmoid junction, and anus: Secondary | ICD-10-CM | POA: Diagnosis not present

## 2019-06-09 DIAGNOSIS — Z8543 Personal history of malignant neoplasm of ovary: Secondary | ICD-10-CM | POA: Insufficient documentation

## 2019-06-09 DIAGNOSIS — Z452 Encounter for adjustment and management of vascular access device: Secondary | ICD-10-CM | POA: Insufficient documentation

## 2019-06-09 MED ORDER — HEPARIN SOD (PORK) LOCK FLUSH 100 UNIT/ML IV SOLN
500.0000 [IU] | Freq: Once | INTRAVENOUS | Status: AC
  Administered 2019-06-09: 500 [IU] via INTRAVENOUS

## 2019-06-09 MED ORDER — SODIUM CHLORIDE 0.9% FLUSH
20.0000 mL | Freq: Once | INTRAVENOUS | Status: AC
  Administered 2019-06-09: 20 mL via INTRAVENOUS

## 2019-06-09 NOTE — Patient Instructions (Signed)
[place] [facility]  Discharge Instructions:  Your port was flushed today. We will see you in 3 months for another port flush. _______________________________________________________________  Thank you for choosing [place] at [facility] to provide your oncology and hematology care.  To afford each patient quality time with our providers, please arrive at least 15 minutes before your scheduled appointment.  You need to re-schedule your appointment if you arrive 10 or more minutes late.  We strive to give you quality time with our providers, and arriving late affects you and other patients whose appointments are after yours.  Also, if you no show three or more times for appointments you may be dismissed from the clinic.  Again, thank you for choosing [place] [facility] [place] hope is that these requests will allow you access to exceptional care and in a timely manner. _______________________________________________________________  If you have questions after your visit, please contact our office at [phone] between the hours of 8:30 a.m. and 5:00 p.m. Voicemails left after 4:30 p.m. will not be returned until the following business day. _______________________________________________________________  For prescription refill requests, have your pharmacy contact our office. _______________________________________________________________  Recommendations made by the consultant and any test results will be sent to your referring physician. _______________________________________________________________

## 2019-06-09 NOTE — Progress Notes (Signed)
Abigail Wagner presents today for portacath flush. VSS. Right upper chest port accessed and flushed per protocol. See MAR and IV assessment for details. Port deaccessed and bandaid applied. Site clean and intact. Discharged in satisfactory condition with follow up instructions.

## 2019-06-13 ENCOUNTER — Encounter (HOSPITAL_COMMUNITY): Payer: Self-pay | Admitting: Psychiatry

## 2019-06-13 ENCOUNTER — Other Ambulatory Visit: Payer: Self-pay

## 2019-06-13 ENCOUNTER — Ambulatory Visit (INDEPENDENT_AMBULATORY_CARE_PROVIDER_SITE_OTHER): Payer: Medicare Other | Admitting: Psychiatry

## 2019-06-13 DIAGNOSIS — F331 Major depressive disorder, recurrent, moderate: Secondary | ICD-10-CM

## 2019-06-13 MED ORDER — DIVALPROEX SODIUM ER 500 MG PO TB24: 500.0000 mg | ORAL_TABLET | Freq: Every day | ORAL | 3 refills | Status: DC

## 2019-06-13 MED ORDER — ESCITALOPRAM OXALATE 20 MG PO TABS: 20.0000 mg | ORAL_TABLET | Freq: Two times a day (BID) | ORAL | 1 refills | Status: DC

## 2019-06-13 MED ORDER — LORAZEPAM 1 MG PO TABS: 1.0000 mg | ORAL_TABLET | Freq: Three times a day (TID) | ORAL | 3 refills | Status: DC | PRN

## 2019-06-13 NOTE — Progress Notes (Signed)
Virtual Visit via Telephone Note  I connected with Abigail Wagner on 06/13/19 at 11:00 AM EDT by telephone and verified that I am speaking with the correct person using two identifiers.   I discussed the limitations, risks, security and privacy concerns of performing an evaluation and management service by telephone and the availability of in person appointments. I also discussed with the patient that there may be a patient responsible charge related to this service. The patient expressed understanding and agreed to proceed.     I discussed the assessment and treatment plan with the patient. The patient was provided an opportunity to ask questions and all were answered. The patient agreed with the plan and demonstrated an understanding of the instructions.   The patient was advised to call back or seek an in-person evaluation if the symptoms worsen or if the condition fails to improve as anticipated.  I provided 15 minutes of non-face-to-face time during this encounter.   [name], MD  [facility] MD/PA/NP OP Progress Note  06/13/2019 10:56 AM Abigail Wagner  MRN:  [id number]  Chief Complaint:  Chief Complaint    Depression; Anxiety; Follow-up     HPI: This patient is a 68 year old married white female lives with her husband in [place]. She has 3 children and 5 grandchildren. She is retired from [employer].  The patient states she's had depression since her 79s. She was hospitalized several times, last time being in 2010 when she took a drug overdose. She was going through a lot of family stress back then. She states that she [facility] and since then she has never wanted to go back. She's been quite stable despite her low doses of medication. She's also stopped drinking alcohol which is made a big difference  The patient returns for follow-up after 4 months.  She states that her husband had a GI bleed about 4 weeks ago and was hospitalized.  He has lost  about 45 pounds but he is slowly starting to do better and regain his strength.  She states that despite this her mood has remained stable and she has not been depressed or manic.  She is sleeping well her energy is good and she is eating well.  She has had no recurrence of her cancer.  She thinks that her medications are still helping her depression and anxiety. Visit Diagnosis:    ICD-10-CM   1. Major depressive disorder, recurrent episode, moderate (HCC)  F33.1 escitalopram (LEXAPRO) 20 MG tablet    Past Psychiatric History: Past hospitalizations for depression and alcohol abuse, none since 2010  Past Medical History:  Past Medical History:  Diagnosis Date  . Allergy   . Anal carcinoma [facility]) oncologist-  dr Whitney Muse ([facility])   dx 12/ 2016 [facility] --  chemo and radiation- Dr. Marcello Moores  . Anemia    due to her cancer  . Anxiety    Dr. Harrington Challenger at [place] (psychiatrist).    . Carcinoma of ovary, stage 3 [facility]) oncologist-  dr gehrig/  dr Doreene Eland (cancer center in [place] [facility])--  no recurrency   dx 09/ 2015  Stage IIIB  papillary ovarian carcinoma  s/p  omentectomy and BSO &  chemotherapy (completed 12-07-2014)  . Chemotherapy induced nausea and vomiting 10/23/2014  . Chronic kidney disease    stones  . DDD (degenerative disc disease), lumbosacral   . GERD (gastroesophageal reflux disease)   . Headache(784.0)   . History of acute respiratory  failure    05-30-2009  drug overdose-- (intubated for 2 days)  and aspiration pneumonia  . History of adenomatous polyp of colon 10/07/2015   tubular adenoma high grade dysplasia  . History of herpes genitalis   . History of kidney stones   . History of ovarian cancer 08/03/2015  . History of suicide attempt    per documentation in epic  05-30-2009  overdose benaodiazepine  . HTN (hypertension)    takes Metoprolol daily  . Hyperlipidemia   . Hypertension 09/27/2016  . IBS (irritable bowel syndrome)    takes Bentyl daily  . Internal  carotid artery stenosis, left    <50%, ulcerative plaque at carotid bulb.   . Major depression   . Mood disorder ([place])   . OA (osteoarthritis)    both hips  . Ovarian cancer ([place])    2015-TAH/BSO  . Prolapse of vaginal vault after hysterectomy 07/20/2015  . Rectovaginal fistula 08/05/2015  . Sigmoid diverticulosis   . Smokers' cough [facility])     Past Surgical History:  Procedure Laterality Date  . BREAST ENHANCEMENT SURGERY  1986  . [place]  . COLONOSCOPY N/A 10/07/2015   Procedure: COLONOSCOPY;  Surgeon: Rogene Houston, MD;  [facility];  Service: Endoscopy;  Laterality: N/A;  7:30  . EXPLORATORY LAPAROTOMY/ OMENTECTOMY/  BILATERAL SALPINGOOPHORECTOMY/  [facility] PLACEMENT  07-03-2014   [facility]  . KNEE ARTHROSCOPY Left 09-22-2004  . LAPAROSCOPY N/A 06/02/2014   Procedure: DIAGNOSTIC LAPAROSCOPY, OMENTAL BIOPSY, RIGHT OVARY BIOPSY, LYSIS OF ADHESIONS;  Surgeon: Fanny Skates, MD;  Location: [place];  Service: General;  Laterality: N/A;  . MUCOSAL ADVANCEMENT FLAP N/A 06/15/2016   Procedure: EXCISION RECTOVAGINAOL FISTULA WITH MUCOSAL ADVANCEMENT FLAP;  Surgeon: Leighton Ruff, MD;  Location: [place];  Service: General;  Laterality: N/A;  . PLACEMENT OF SETON N/A 09/09/2015   Procedure: PLACEMENT OF SETON;  Surgeon: Leighton Ruff, MD;  Location: [place];  Service: General;  Laterality: N/A;  . PORT-A-CATH REMOVAL Right 08/17/2014   Procedure: REMOVAL INTRAPERITONEAL CHEMO PORT;  Surgeon: Fanny Skates, MD;  Location: [place];  Service: General;  Laterality: Right;  . PORTACATH PLACEMENT Right 08/17/2014   Procedure:  PLACE NEW PORT A CATH;  Surgeon: Fanny Skates, MD;  Location: [place];  Service: General;  Laterality: Right;  . RECTAL BIOPSY N/A 09/09/2015   Procedure: BIOPSY OF RECTOVAGINAL MASS;  Surgeon: Leighton Ruff, MD;  Location: [place];  Service: General;   Laterality: N/A;  . TUBAL LIGATION  YRS AGO  . VAGINAL HYSTERECTOMY  1981   fibroids    Family Psychiatric History: See below  Family History:  Family History  Problem Relation Age of Onset  . Bipolar disorder Mother   . Anxiety disorder Mother   . Dementia Mother   . Depression Mother   . Mental illness Mother   . Vision loss Mother   . Alzheimer's disease Mother   . Alcohol abuse Paternal Uncle   . Bipolar disorder Maternal Grandmother   . Dementia Maternal Grandmother   . Alzheimer's disease Maternal Grandmother   . Alcohol abuse Paternal Uncle   . Stroke Brother   . Deep vein thrombosis Son   . Heart disease Father   . Hyperlipidemia Father   . Hypertension Father   . Stroke Father   . Vision loss Father   . Atrial fibrillation Sister   . Heart disease Brother   .  Heart disease Brother   . ADD / ADHD Neg Hx   . Drug abuse Neg Hx   . OCD Neg Hx   . Paranoid behavior Neg Hx   . Schizophrenia Neg Hx   . Seizures Neg Hx   . Sexual abuse Neg Hx   . Physical abuse Neg Hx     Social History:  Social History   Socioeconomic History  . Marital status: Married    Spouse name: Coralyn Mark  . Number of children: 3  . Years of education: 48  . Highest education level: Not on file  Occupational History  . Occupation: retire    Comment: Animal nutritionist  . Financial resource strain: Not on file  . Food insecurity    Worry: Not on file    Inability: Not on file  . Transportation needs    Medical: Not on file    Non-medical: Not on file  Tobacco Use  . Smoking status: Current Every Day Smoker    Packs/day: 0.50    Years: 43.00    Pack years: 21.50    Types: Cigarettes  . Smokeless tobacco: Never Used  Substance and Sexual Activity  . Alcohol use: No  . Drug use: No  . Sexual activity: Not Currently    Birth control/protection: Surgical  Lifestyle  . Physical activity    Days per week: Not on file    Minutes per session: Not on file  . Stress: Not on  file  Relationships  . Social Herbalist on phone: Not on file    Gets together: Not on file    Attends religious service: Not on file    Active member of club or organization: Not on file    Attends meetings of clubs or organizations: Not on file    Relationship status: Not on file  Other Topics Concern  . Not on file  Social History Narrative   Lives at home with Coralyn Mark   Retired [employer]    Allergies: No Known Allergies  Metabolic Disorder Labs: No results found for: HGBA1C, MPG No results found for: PROLACTIN Lab Results  Component Value Date   CHOL 225 (H) 05/21/2018   TRIG 85 05/21/2018   HDL 53 05/21/2018   CHOLHDL 4.2 05/21/2018   VLDL 28 01/10/2017   LDLCALC 153 (H) 05/21/2018   LDLCALC 164 (H) 01/10/2017     Therapeutic Level Labs: No results found for: LITHIUM No results found for: VALPROATE No components found for:  CBMZ  Current Medications: Current Outpatient Medications  Medication Sig Dispense Refill  . alclomethasone (ACLOVATE) 0.05 % cream APPLY TO AFFECTED AREA TWICE A DAY AS NEEDED    . b complex vitamins tablet Take 1 tablet by mouth daily.    . Coenzyme Q10 (CO Q 10) 10 MG CAPS Take by mouth daily.    . divalproex (DEPAKOTE ER) 500 MG 24 hr tablet Take 1 tablet (500 mg total) by mouth daily. 90 tablet 3  . escitalopram (LEXAPRO) 20 MG tablet Take 1 tablet (20 mg total) by mouth 2 (two) times daily. 180 tablet 1  . fluorometholone (FML) 0.1 % ophthalmic suspension INSTILL 1 DROP INTO EACH EYE TWICE DAILY FOR 14 DAYS    . HORSE CHESTNUT PO Take by mouth.    Marland Kitchen HYDROcodone-acetaminophen (NORCO) 5-325 MG tablet Take 1 tablet by mouth every 6 (six) hours as needed for moderate pain. 30 tablet 0  . ketoconazole (NIZORAL) 2 % cream  Apply 1 application topically as needed. prn     . KRILL OIL PO Take 350 mg by mouth daily.    . lansoprazole (PREVACID) 15 MG capsule Take 15 mg by mouth as needed.     Marland Kitchen LORazepam (ATIVAN) 1 MG tablet Take 1  tablet (1 mg total) by mouth 3 (three) times daily as needed for anxiety. 90 tablet 3  . losartan (COZAAR) 50 MG tablet TAKE 1 TABLET BY MOUTH EVERY DAY 90 tablet 3  . meloxicam (MOBIC) 15 MG tablet TAKE 1 TABLET BY MOUTH EVERY DAY 30 tablet 2  . Multiple Vitamin (MULTIVITAMIN) tablet Take 1 tablet by mouth daily.    . rosuvastatin (CRESTOR) 20 MG tablet TAKE 1 TABLET BY MOUTH EVERY DAY 90 tablet 1  . valACYclovir (VALTREX) 1000 MG tablet Take 1 tablet (1,000 mg total) by mouth daily as needed. 5 tablet 11   No current facility-administered medications for this visit.      Musculoskeletal: Strength & Muscle Tone: within normal limits Gait & Station: normal Patient leans: N/A  Psychiatric Specialty Exam: Review of Systems  Musculoskeletal: Positive for back pain and joint pain.  All other systems reviewed and are negative.   There were no vitals taken for this visit.There is no height or weight on file to calculate BMI.  General Appearance: NA  Eye Contact:  NA  Speech:  Clear and Coherent  Volume:  Normal  Mood:  Euthymic  Affect:  NA  Thought Process:  Goal Directed  Orientation:  Full (Time, Place, and Person)  Thought Content: WDL   Suicidal Thoughts:  No  Homicidal Thoughts:  No  Memory:  Immediate;   Good Recent;   Good Remote;   Fair  Judgement:  Good  Insight:  Good  Psychomotor Activity:  Normal  Concentration:  Concentration: Good and Attention Span: Good  Recall:  Good  Fund of Knowledge: Good  Language: Good  Akathisia:  No  Handed:  Right  AIMS (if indicated): not done  Assets:  Communication Skills Desire for Improvement Resilience Social Support Talents/Skills  ADL's:  Intact  Cognition: WNL  Sleep:  Good   Screenings: PHQ2-9     Office Visit from 05/14/2018 in [place] Office Visit from 01/15/2018 in [facility] [place] from 01/08/2017 in [place] Visit from 11/07/2016 in [place]  [facility]  PHQ-2 Total Score  1  1  0  0  PHQ-9 Total Score  4  -  -  -       Assessment and Plan: This patient is a 68 year old female with a history of depression and a remote history of alcohol abuse and anxiety.  She continues to do well on her current regimen.  She will continue Ativan 1 mg 3 times daily as needed for anxiety, Lexapro 20 mg twice daily for depression and Depakote ER 500 mg at bedtime for mood stabilization.  She will return to see me in 4 months   [name], MD 06/13/2019, 10:56 AM

## 2019-06-24 ENCOUNTER — Other Ambulatory Visit (HOSPITAL_COMMUNITY): Payer: Self-pay | Admitting: Family Medicine

## 2019-06-24 DIAGNOSIS — Z1231 Encounter for screening mammogram for malignant neoplasm of breast: Secondary | ICD-10-CM

## 2019-06-27 ENCOUNTER — Other Ambulatory Visit: Payer: Self-pay | Admitting: Family Medicine

## 2019-07-01 ENCOUNTER — Other Ambulatory Visit: Payer: Self-pay | Admitting: Family Medicine

## 2019-07-01 MED ORDER — HYDROCODONE-ACETAMINOPHEN 5-325 MG PO TABS: 1.0000 | ORAL_TABLET | Freq: Four times a day (QID) | ORAL | 0 refills | Status: DC | PRN

## 2019-07-01 NOTE — Telephone Encounter (Signed)
Patient is requesting a refill on Hydrocodone   LOV: 04/04/19   LRF:

## 2019-07-28 ENCOUNTER — Ambulatory Visit (HOSPITAL_COMMUNITY)
Admission: RE | Admit: 2019-07-28 | Discharge: 2019-07-28 | Disposition: A | Discharge status: Home / Self Care | Payer: Medicare Other | Source: Ambulatory Visit | Attending: Family Medicine | Admitting: Family Medicine

## 2019-07-28 ENCOUNTER — Other Ambulatory Visit: Payer: Self-pay

## 2019-07-28 DIAGNOSIS — Z1231 Encounter for screening mammogram for malignant neoplasm of breast: Secondary | ICD-10-CM | POA: Diagnosis not present

## 2019-08-01 ENCOUNTER — Other Ambulatory Visit: Payer: Self-pay | Admitting: Family Medicine

## 2019-08-01 MED ORDER — HYDROCODONE-ACETAMINOPHEN 5-325 MG PO TABS: 1.0000 | ORAL_TABLET | Freq: Four times a day (QID) | ORAL | 0 refills | Status: DC | PRN

## 2019-08-01 NOTE — Telephone Encounter (Signed)
Patient is requesting a refill on Hydrocodone    LOV: 04/04/19  LRF:   07/01/2019

## 2019-08-04 ENCOUNTER — Ambulatory Visit: Payer: Medicare Other | Admitting: Family Medicine

## 2019-08-08 ENCOUNTER — Other Ambulatory Visit: Payer: Self-pay

## 2019-08-08 ENCOUNTER — Ambulatory Visit (INDEPENDENT_AMBULATORY_CARE_PROVIDER_SITE_OTHER): Payer: Medicare Other | Admitting: Family Medicine

## 2019-08-08 VITALS — BP 126/72 | HR 83 | Temp 98.1°F | Resp 12 | Ht 65.0 in | Wt 156.0 lb

## 2019-08-08 DIAGNOSIS — M5136 Other intervertebral disc degeneration, lumbar region: Secondary | ICD-10-CM

## 2019-08-08 MED ORDER — HYDROCODONE-ACETAMINOPHEN 5-325 MG PO TABS: 1.0000 | ORAL_TABLET | ORAL | 0 refills | Status: DC | PRN

## 2019-08-08 NOTE — Progress Notes (Signed)
Subjective:    Patient ID: Abigail Wagner, female    DOB: 1951-04-07, 68 y.o.   MRN: [id number]  HPI  03/2019 Patient is a very pleasant 68 year old Caucasian female who has a very complicated past medical history who has a history of stage III ovarian cancer with metastasis to the omentum.  This was surgically resected with a bilateral salpingo-oophorectomy.  Patient had previously had a partial hysterectomy.  She is currently followed with periodic imaging as well as a Ca125 at the [facility].  She also has a history of stage II anal cancer.  She sees a Dr. Marcello Moores who performs periodic endoscopies usually every 6 months according to the patient.    She has been seeing her orthopedist for low back pain and has been receiving tramadol.  Unfortunately, the orthopedist says that there is nothing that they can do surgically to help her low back pain.  Physical therapy has not been beneficial.  Her most recent MRI from February is dictated below:  FINDINGS: Segmentation:  Normal  Alignment:  . mild retrolisthesis L1-2.  Mild anterolisthesis L3-4.  Vertebrae: Negative for fracture or mass. No significant bone marrow edema.  Conus medullaris and cauda equina: Conus extends to the T12-L1 level. Conus and cauda equina appear normal.  Paraspinal and other soft tissues: Negative for paraspinous mass or fluid collection.  Disc levels:  L1-2: Disc degeneration with disc bulging and mild spurring. No significant stenosis. Mild facet degeneration.  L2-3: Moderate disc degeneration with disc space narrowing and spurring. Right-sided disc protrusion and osteophyte is unchanged causing subarticular stenosis on the right and mild to moderate right foraminal encroachment. Bilateral facet hypertrophy. Spinal canal not stenotic.  L3-4: Disc degeneration. Small extruded disc fragment on the right extending cranially into the foramen is unchanged. There is subarticular and foraminal  stenosis on the right which is unchanged. Possible right L3 and L4 nerve root impingement. Moderate to advanced facet degeneration with mild spinal stenosis  L4-5: Disc degeneration and spurring on the left with left-sided facet hypertrophy causing subarticular and foraminal encroachment on the left. This is moderately severe and unchanged from the prior study  L5-S1: Disc and facet degeneration.  No significant spinal stenosis.   He has referred her back to me to help manage her chronic pain in her back.  Pain is located primarily in her lower back between L3 and L5.  It is made worse by prolonged standing and walking such as when she shops or buys groceries.  Is better after sitting and resting.  She has been using tramadol once a day or every other day when the pain is severe to help dull the pain.  However she is taking an extremely high dose of Lexapro 20 mg twice daily and therefore I am somewhat concerned about possible serotonin syndrome.  We discussed this and I would not like to continue tramadol long-term in this patient.  She has tolerated hydrocodone in the past without difficulty.  At that time, my plan was: I do not want to continue the tramadol due to the risk of serotonin syndrome.  No more often than the patient is taking the medication I would be comfortable giving her Norco 5/325 1 p.o. every 6 hours as needed pain.  She is taking 1 tablet every day to every other day.  Therefore 30 tablets should last her at least 1 month and possibly 2 months.  If she is using it this sparingly I would be willing  to continue this long-term.  Patient is comfortable with this plan.  08/08/19 Patient is requesting a referral to a pain clinic.  She states the pain in her back is intensifying.  She states that 1 pain pill a day is no longer managing the pain.  She states that if she has to do anything involved like shopping for groceries or cleaning her house or performing chores that involves her  standing or walking for prolonged periods of time, she will have intense pain in her lower back that radiates into both gluteal cheeks and into the lateral thighs bilaterally.  This keeps her from sleeping at night.  She occasionally has to take a second pain pill at night to help her sleep.  She then has significant pain the following day and she is unable to take another hydrocodone because she will run out early.  In other words 30 tablets or not lasting long enough.  She believes that she needs to go to a pain clinic in order to get more pain medication.  She denies any saddle anesthesia.  She denies any bowel or bladder incontinence.  She denies any leg weakness.  She does have radicular symptoms into both lateral thighs with prolonged standing suggesting lumbar radiculopathy however there is no evidence or sign of cauda equina syndrome.   Past Surgical History:  Procedure Laterality Date  . BREAST ENHANCEMENT SURGERY  1986  . [place]  . COLONOSCOPY N/A 10/07/2015   Procedure: COLONOSCOPY;  Surgeon: Rogene Houston, MD;  [facility];  Service: Endoscopy;  Laterality: N/A;  7:30  . EXPLORATORY LAPAROTOMY/ OMENTECTOMY/  BILATERAL SALPINGOOPHORECTOMY/  [facility] PLACEMENT  07-03-2014   [facility]  . KNEE ARTHROSCOPY Left 09-22-2004  . LAPAROSCOPY N/A 06/02/2014   Procedure: DIAGNOSTIC LAPAROSCOPY, OMENTAL BIOPSY, RIGHT OVARY BIOPSY, LYSIS OF ADHESIONS;  Surgeon: Fanny Skates, MD;  Location: [place];  Service: General;  Laterality: N/A;  . MUCOSAL ADVANCEMENT FLAP N/A 06/15/2016   Procedure: EXCISION RECTOVAGINAOL FISTULA WITH MUCOSAL ADVANCEMENT FLAP;  Surgeon: Leighton Ruff, MD;  Location: [place];  Service: General;  Laterality: N/A;  . PLACEMENT OF SETON N/A 09/09/2015   Procedure: PLACEMENT OF SETON;  Surgeon: Leighton Ruff, MD;  Location: [place];  Service: General;  Laterality: N/A;  . PORT-A-CATH REMOVAL Right 08/17/2014    Procedure: REMOVAL INTRAPERITONEAL CHEMO PORT;  Surgeon: Fanny Skates, MD;  Location: [place];  Service: General;  Laterality: Right;  . PORTACATH PLACEMENT Right 08/17/2014   Procedure:  PLACE NEW PORT A CATH;  Surgeon: Fanny Skates, MD;  Location: [place];  Service: General;  Laterality: Right;  . RECTAL BIOPSY N/A 09/09/2015   Procedure: BIOPSY OF RECTOVAGINAL MASS;  Surgeon: Leighton Ruff, MD;  Location: [place];  Service: General;  Laterality: N/A;  . TUBAL LIGATION  YRS AGO  . VAGINAL HYSTERECTOMY  1981   fibroids   Current Outpatient Medications on File Prior to Visit  Medication Sig Dispense Refill  . alclomethasone (ACLOVATE) 0.05 % cream APPLY TO AFFECTED AREA TWICE A DAY AS NEEDED    . b complex vitamins tablet Take 1 tablet by mouth daily.    . Coenzyme Q10 (CO Q 10) 10 MG CAPS Take by mouth daily.    . divalproex (DEPAKOTE ER) 500 MG 24 hr tablet Take 1 tablet (500 mg total) by mouth daily. 90 tablet 3  . escitalopram (LEXAPRO) 20 MG tablet Take 1 tablet (  20 mg total) by mouth 2 (two) times daily. 180 tablet 1  . fluorometholone (FML) 0.1 % ophthalmic suspension INSTILL 1 DROP INTO EACH EYE TWICE DAILY FOR 14 DAYS    . HORSE CHESTNUT PO Take by mouth.    Marland Kitchen ketoconazole (NIZORAL) 2 % cream Apply 1 application topically as needed. prn     . KRILL OIL PO Take 350 mg by mouth daily.    . lansoprazole (PREVACID) 15 MG capsule Take 15 mg by mouth as needed.     Marland Kitchen LORazepam (ATIVAN) 1 MG tablet Take 1 tablet (1 mg total) by mouth 3 (three) times daily as needed for anxiety. 90 tablet 3  . losartan (COZAAR) 50 MG tablet TAKE 1 TABLET BY MOUTH EVERY DAY 90 tablet 3  . meloxicam (MOBIC) 15 MG tablet TAKE 1 TABLET BY MOUTH EVERY DAY 30 tablet 2  . Multiple Vitamin (MULTIVITAMIN) tablet Take 1 tablet by mouth daily.    . rosuvastatin (CRESTOR) 20 MG tablet TAKE 1 TABLET BY MOUTH EVERY DAY 90 tablet 1  . valACYclovir (VALTREX) 1000  MG tablet Take 1 tablet (1,000 mg total) by mouth daily as needed. 5 tablet 11   No current facility-administered medications on file prior to visit.    No Known Allergies Social History   Socioeconomic History  . Marital status: Married    Spouse name: Coralyn Mark  . Number of children: 3  . Years of education: 39  . Highest education level: Not on file  Occupational History  . Occupation: retire    Comment: Animal nutritionist  . Financial resource strain: Not on file  . Food insecurity    Worry: Not on file    Inability: Not on file  . Transportation needs    Medical: Not on file    Non-medical: Not on file  Tobacco Use  . Smoking status: Current Every Day Smoker    Packs/day: 0.50    Years: 43.00    Pack years: 21.50    Types: Cigarettes  . Smokeless tobacco: Never Used  Substance and Sexual Activity  . Alcohol use: No  . Drug use: No  . Sexual activity: Not Currently    Birth control/protection: Surgical  Lifestyle  . Physical activity    Days per week: Not on file    Minutes per session: Not on file  . Stress: Not on file  Relationships  . Social Herbalist on phone: Not on file    Gets together: Not on file    Attends religious service: Not on file    Active member of club or organization: Not on file    Attends meetings of clubs or organizations: Not on file    Relationship status: Not on file  . Intimate partner violence    Fear of current or ex partner: Not on file    Emotionally abused: Not on file    Physically abused: Not on file    Forced sexual activity: Not on file  Other Topics Concern  . Not on file  Social History Narrative   Lives at home with Coralyn Mark   Retired [employer]   Family History  Problem Relation Age of Onset  . Bipolar disorder Mother   . Anxiety disorder Mother   . Dementia Mother   . Depression Mother   . Mental illness Mother   . Vision loss Mother   . Alzheimer's disease Mother   . Alcohol abuse Paternal  Uncle   .  Bipolar disorder Maternal Grandmother   . Dementia Maternal Grandmother   . Alzheimer's disease Maternal Grandmother   . Alcohol abuse Paternal Uncle   . Stroke Brother   . Deep vein thrombosis Son   . Heart disease Father   . Hyperlipidemia Father   . Hypertension Father   . Stroke Father   . Vision loss Father   . Atrial fibrillation Sister   . Heart disease Brother   . Heart disease Brother   . ADD / ADHD Neg Hx   . Drug abuse Neg Hx   . OCD Neg Hx   . Paranoid behavior Neg Hx   . Schizophrenia Neg Hx   . Seizures Neg Hx   . Sexual abuse Neg Hx   . Physical abuse Neg Hx       Review of Systems  All other systems reviewed and are negative.      Objective:   Physical Exam Vitals signs reviewed.  Constitutional:      General: She is not in acute distress.    Appearance: She is well-developed. She is not diaphoretic.  Neck:     Thyroid: No thyromegaly.     Vascular: No JVD.     Trachea: No tracheal deviation.  Cardiovascular:     Rate and Rhythm: Normal rate and regular rhythm.     Pulses:          Carotid pulses are on the left side with bruit.    Heart sounds: Normal heart sounds. No murmur. No friction rub. No gallop.   Pulmonary:     Effort: Pulmonary effort is normal. No respiratory distress.     Breath sounds: Normal breath sounds. No stridor. No wheezing or rales.  Musculoskeletal:     Lumbar back: She exhibits decreased range of motion, tenderness and pain.  Neurological:     Mental Status: She is alert.     Motor: No abnormal muscle tone.           Assessment & Plan:  DDD (degenerative disc disease), lumbar  I am willing to give the patient hydrocodone/acetaminophen 5/325 1 tablet every 4 hours as needed for pain.  We discussed and I will give her 60 tablets a month.  This would be sufficient to allow her to take 1 tablet in the morning and 1 tablet at night.  She believes that this would allow her to perform her chores around her  home and also shop for groceries etc. without significant pain and decreased quality of life.  We discussed the risk of habituation and dependency.  I would not recommend escalating the dose further.  Patient has already tried cortisone injections in the back on 2 separate occasions.  Both times they helped for approximately 1 week and then the pain returned.  Her surgeon did not recommend surgery for degenerative disc disease.  Therefore we are simply trying to help the patient manage.  She has an appointment in December to see her cancer specialist.  She just had a mammogram.  She is not due for a Pap smear as she has had a hysterectomy.  Her immunizations are up-to-date.  She is due for fasting lab work to monitor her cholesterol.  I recommended that she return at her earliest convenience for a CMP and a fasting lipid panel.

## 2019-08-13 IMAGING — DX RIGHT FOOT COMPLETE - 3+ VIEW
3 series · 3 of 3 positions shown · non-contrast
Comparison: None.

CLINICAL DATA: Right foot pain and swelling, no known injury,
initial encounter

EXAM:
RIGHT FOOT COMPLETE - 3+ VIEW

[foot ap]
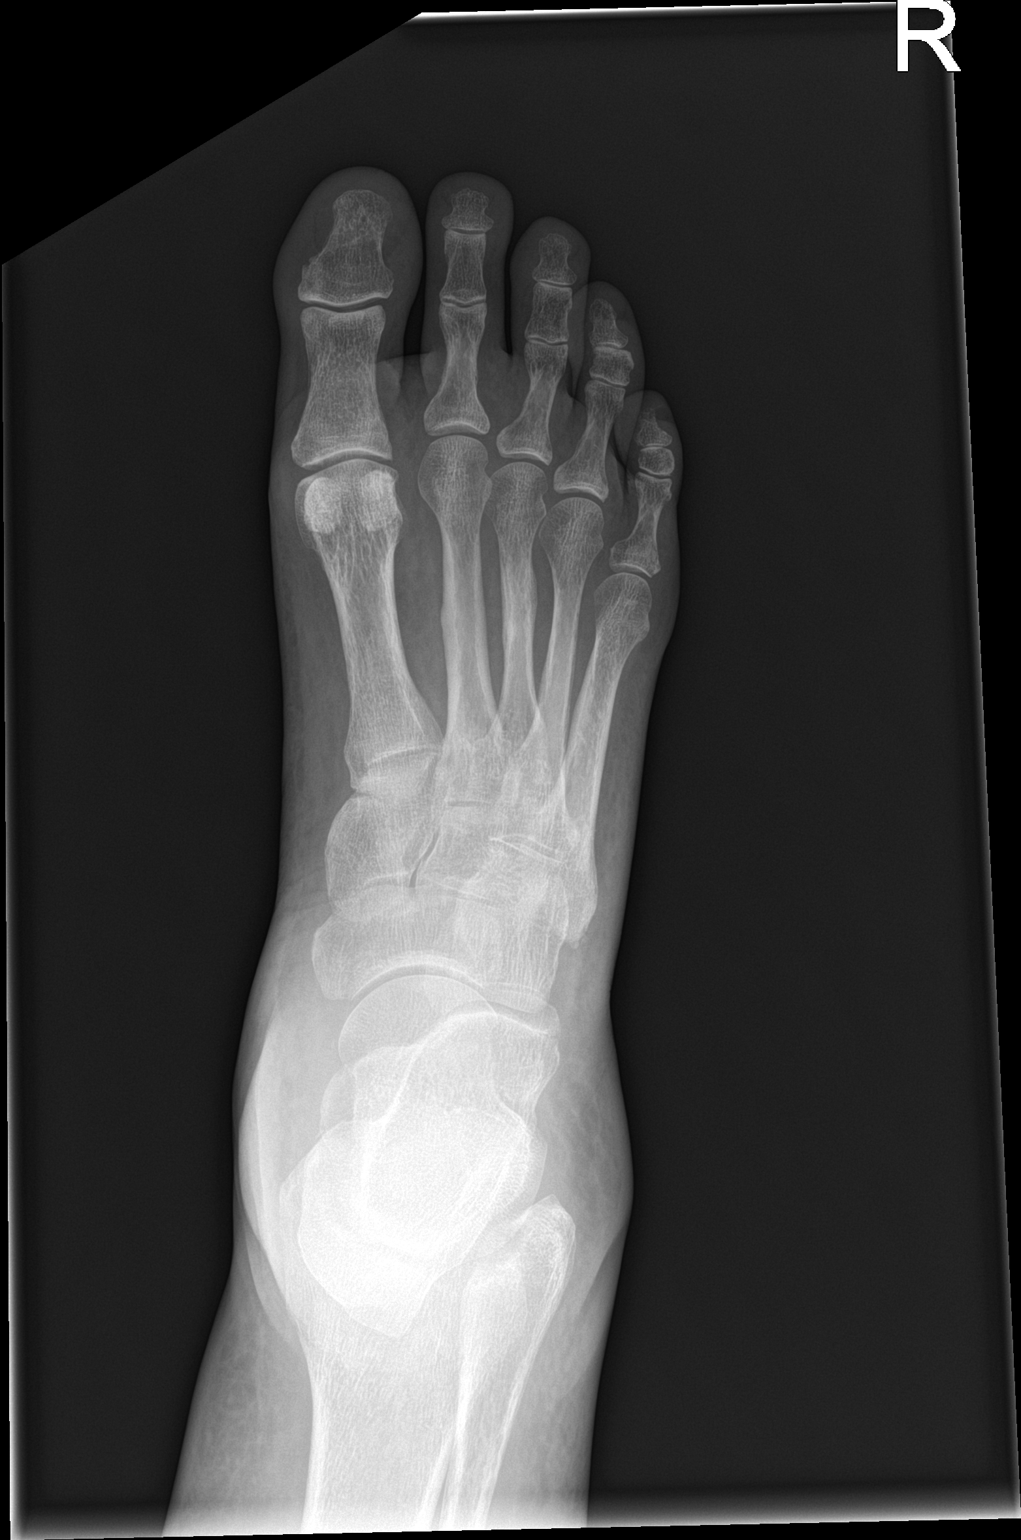

[foot obl]
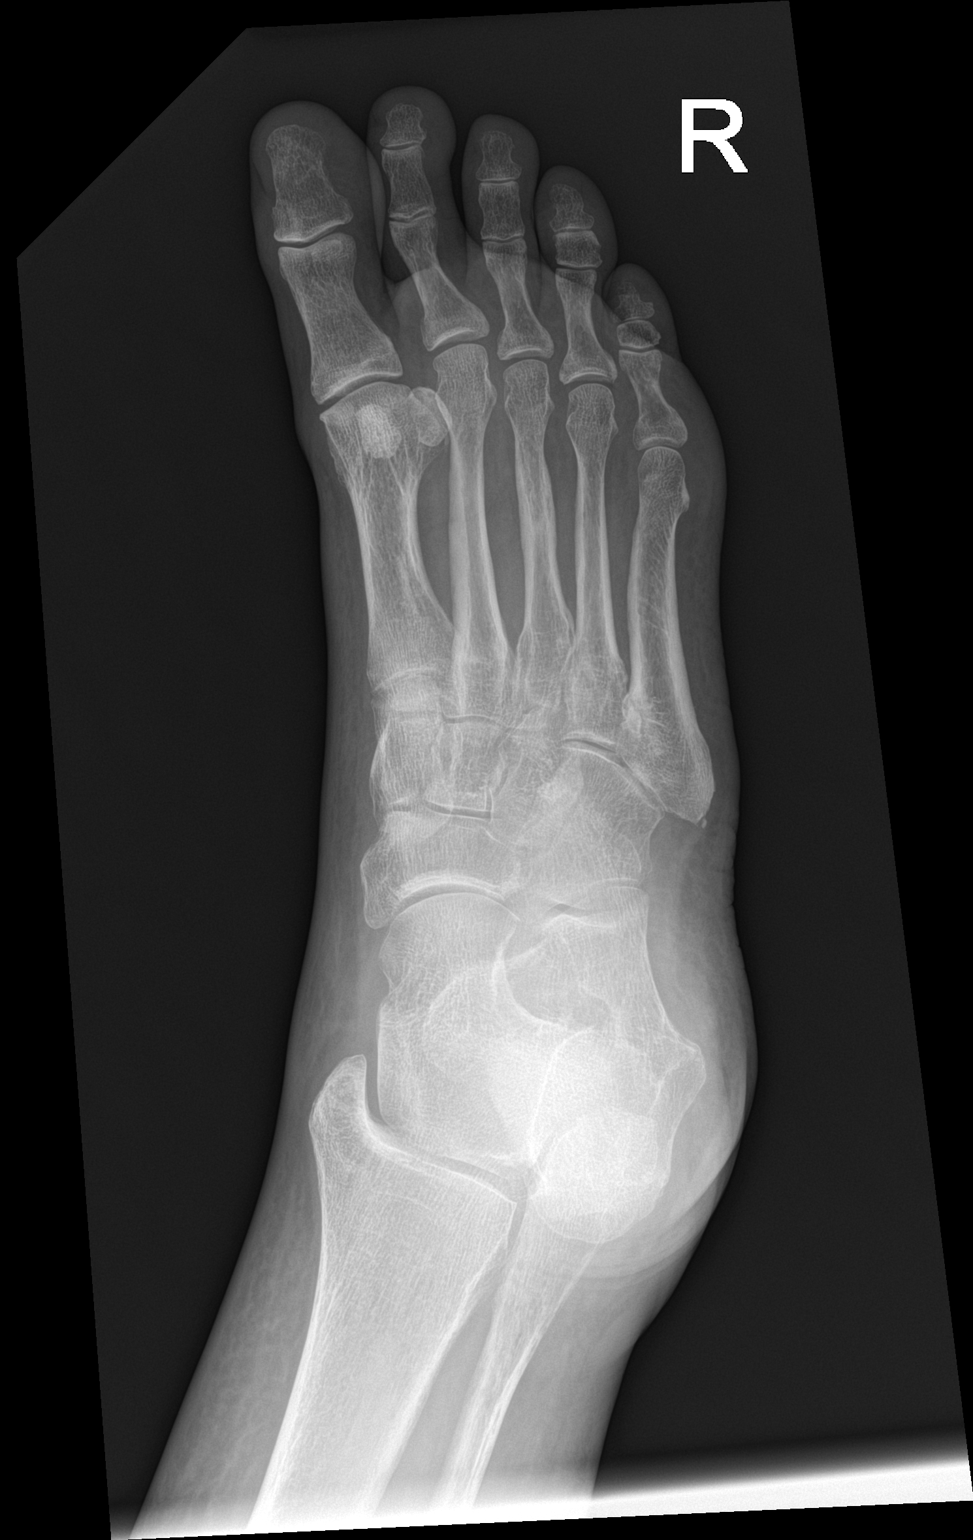

[foot lat]
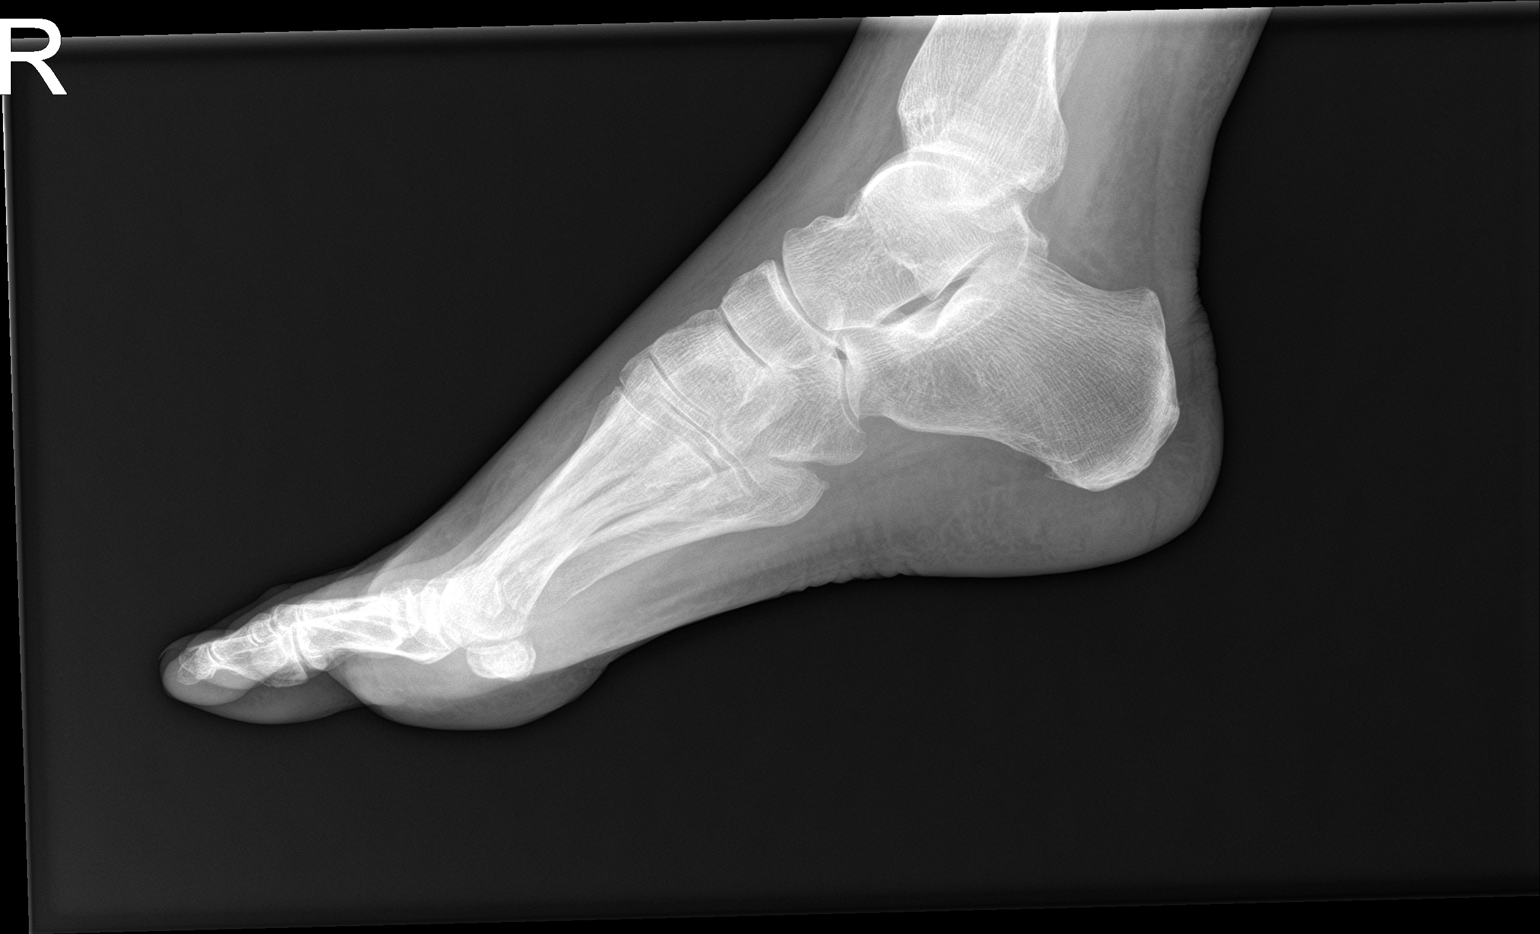

[3 of 3 positions shown; findings below may reference images not displayed]

FINDINGS: There is no evidence of fracture or dislocation. There is no
evidence of arthropathy or other focal bone abnormality. Soft
tissues are unremarkable.
IMPRESSION: No acute abnormality noted.

## 2019-08-29 ENCOUNTER — Other Ambulatory Visit: Payer: Self-pay

## 2019-08-29 ENCOUNTER — Other Ambulatory Visit: Payer: Medicare Other

## 2019-08-29 DIAGNOSIS — E785 Hyperlipidemia, unspecified: Secondary | ICD-10-CM

## 2019-08-29 DIAGNOSIS — I10 Essential (primary) hypertension: Secondary | ICD-10-CM

## 2019-08-30 LAB — CBC WITH DIFFERENTIAL/PLATELET
Absolute Monocytes: 371 cells/uL (ref 200–950)
Basophils Absolute: 29 cells/uL (ref 0–200)
Basophils Relative: 0.8 %
Eosinophils Absolute: 220 cells/uL (ref 15–500)
Eosinophils Relative: 6.1 %
HCT: 41.2 % (ref 35.0–45.0)
Hemoglobin: 13.7 g/dL (ref 11.7–15.5)
Lymphs Abs: 1044 cells/uL (ref 850–3900)
MCH: 31.1 pg (ref 27.0–33.0)
MCHC: 33.3 g/dL (ref 32.0–36.0)
MCV: 93.4 fL (ref 80.0–100.0)
MPV: 10.9 fL (ref 7.5–12.5)
Monocytes Relative: 10.3 %
Neutro Abs: 1937 cells/uL (ref 1500–7800)
Neutrophils Relative %: 53.8 %
Platelets: 202 10*3/uL (ref 140–400)
RBC: 4.41 10*6/uL (ref 3.80–5.10)
RDW: 13.8 % (ref 11.0–15.0)
Total Lymphocyte: 29 %
WBC: 3.6 10*3/uL — ABNORMAL LOW (ref 3.8–10.8)

## 2019-08-30 LAB — COMPREHENSIVE METABOLIC PANEL
AG Ratio: 1.8 (calc) (ref 1.0–2.5)
ALT: 13 U/L (ref 6–29)
AST: 23 U/L (ref 10–35)
Albumin: 4.2 g/dL (ref 3.6–5.1)
Alkaline phosphatase (APISO): 49 U/L (ref 37–153)
BUN: 15 mg/dL (ref 7–25)
CO2: 28 mmol/L (ref 20–32)
Calcium: 9.6 mg/dL (ref 8.6–10.4)
Chloride: 106 mmol/L (ref 98–110)
Creat: 0.76 mg/dL (ref 0.50–0.99)
Globulin: 2.3 g/dL (calc) (ref 1.9–3.7)
Glucose, Bld: 92 mg/dL (ref 65–99)
Potassium: 4.7 mmol/L (ref 3.5–5.3)
Sodium: 142 mmol/L (ref 135–146)
Total Bilirubin: 0.3 mg/dL (ref 0.2–1.2)
Total Protein: 6.5 g/dL (ref 6.1–8.1)

## 2019-08-30 LAB — LIPID PANEL
Cholesterol: 154 mg/dL (ref <200)
HDL: 53 mg/dL (ref >=50)
LDL Cholesterol (Calc): 86 mg/dL (calc)
Non-HDL Cholesterol (Calc): 101 mg/dL (calc) (ref <130)
Total CHOL/HDL Ratio: 2.9 (calc) (ref <5.0)
Triglycerides: 63 mg/dL (ref <150)

## 2019-09-01 ENCOUNTER — Other Ambulatory Visit: Payer: Self-pay

## 2019-09-01 ENCOUNTER — Other Ambulatory Visit (HOSPITAL_COMMUNITY): Payer: Medicare Other

## 2019-09-01 ENCOUNTER — Inpatient Hospital Stay (HOSPITAL_COMMUNITY): Payer: Medicare Other | Attending: Hematology

## 2019-09-01 ENCOUNTER — Encounter (HOSPITAL_COMMUNITY): Payer: Self-pay

## 2019-09-01 DIAGNOSIS — Z923 Personal history of irradiation: Secondary | ICD-10-CM | POA: Insufficient documentation

## 2019-09-01 DIAGNOSIS — E785 Hyperlipidemia, unspecified: Secondary | ICD-10-CM | POA: Diagnosis not present

## 2019-09-01 DIAGNOSIS — Z9221 Personal history of antineoplastic chemotherapy: Secondary | ICD-10-CM | POA: Insufficient documentation

## 2019-09-01 DIAGNOSIS — Z85048 Personal history of other malignant neoplasm of rectum, rectosigmoid junction, and anus: Secondary | ICD-10-CM | POA: Insufficient documentation

## 2019-09-01 DIAGNOSIS — Z86718 Personal history of other venous thrombosis and embolism: Secondary | ICD-10-CM | POA: Insufficient documentation

## 2019-09-01 DIAGNOSIS — Z90710 Acquired absence of both cervix and uterus: Secondary | ICD-10-CM | POA: Insufficient documentation

## 2019-09-01 DIAGNOSIS — Z8543 Personal history of malignant neoplasm of ovary: Secondary | ICD-10-CM | POA: Insufficient documentation

## 2019-09-01 DIAGNOSIS — Z862 Personal history of diseases of the blood and blood-forming organs and certain disorders involving the immune mechanism: Secondary | ICD-10-CM | POA: Insufficient documentation

## 2019-09-01 DIAGNOSIS — F17210 Nicotine dependence, cigarettes, uncomplicated: Secondary | ICD-10-CM | POA: Diagnosis not present

## 2019-09-01 DIAGNOSIS — C210 Malignant neoplasm of anus, unspecified: Secondary | ICD-10-CM

## 2019-09-01 DIAGNOSIS — I10 Essential (primary) hypertension: Secondary | ICD-10-CM | POA: Insufficient documentation

## 2019-09-01 DIAGNOSIS — F329 Major depressive disorder, single episode, unspecified: Secondary | ICD-10-CM | POA: Insufficient documentation

## 2019-09-01 DIAGNOSIS — C569 Malignant neoplasm of unspecified ovary: Secondary | ICD-10-CM

## 2019-09-01 LAB — CBC WITH DIFFERENTIAL/PLATELET
Abs Immature Granulocytes: 0.02 10*3/uL (ref 0.00–0.07)
Basophils Absolute: 0 10*3/uL (ref 0.0–0.1)
Basophils Relative: 1 %
Eosinophils Absolute: 0.2 10*3/uL (ref 0.0–0.5)
Eosinophils Relative: 4 %
HCT: 37.7 % (ref 36.0–46.0)
Hemoglobin: 12.3 g/dL (ref 12.0–15.0)
Immature Granulocytes: 1 %
Lymphocytes Relative: 31 %
Lymphs Abs: 1.1 10*3/uL (ref 0.7–4.0)
MCH: 31.2 pg (ref 26.0–34.0)
MCHC: 32.6 g/dL (ref 30.0–36.0)
MCV: 95.7 fL (ref 80.0–100.0)
Monocytes Absolute: 0.3 10*3/uL (ref 0.1–1.0)
Monocytes Relative: 9 %
Neutro Abs: 2 10*3/uL (ref 1.7–7.7)
Neutrophils Relative %: 54 %
Platelets: 183 10*3/uL (ref 150–400)
RBC: 3.94 MIL/uL (ref 3.87–5.11)
RDW: 14.4 % (ref 11.5–15.5)
WBC: 3.7 10*3/uL — ABNORMAL LOW (ref 4.0–10.5)
nRBC: 0 % (ref 0.0–0.2)

## 2019-09-01 LAB — IRON AND TIBC
Iron: 86 ug/dL (ref 28–170)
Saturation Ratios: 24 % (ref 10.4–31.8)
TIBC: 363 ug/dL (ref 250–450)
UIBC: 277 ug/dL

## 2019-09-01 LAB — COMPREHENSIVE METABOLIC PANEL
ALT: 16 U/L (ref 0–44)
AST: 25 U/L (ref 15–41)
Albumin: 3.9 g/dL (ref 3.5–5.0)
Alkaline Phosphatase: 45 U/L (ref 38–126)
Anion gap: 11 (ref 5–15)
BUN: 15 mg/dL (ref 8–23)
CO2: 25 mmol/L (ref 22–32)
Calcium: 9.5 mg/dL (ref 8.9–10.3)
Chloride: 100 mmol/L (ref 98–111)
Creatinine, Ser: 0.66 mg/dL (ref 0.44–1.00)
GFR calc Af Amer: >60 mL/min (ref >60)
GFR calc non Af Amer: >60 mL/min (ref >60)
Glucose, Bld: 112 mg/dL — ABNORMAL HIGH (ref 70–99)
Potassium: 4.2 mmol/L (ref 3.5–5.1)
Sodium: 136 mmol/L (ref 135–145)
Total Bilirubin: 0.5 mg/dL (ref 0.3–1.2)
Total Protein: 6.5 g/dL (ref 6.5–8.1)

## 2019-09-01 LAB — FERRITIN: Ferritin: 33 ng/mL (ref 11–307)

## 2019-09-01 MED ORDER — HEPARIN SOD (PORK) LOCK FLUSH 100 UNIT/ML IV SOLN
500.0000 [IU] | Freq: Once | INTRAVENOUS | Status: AC
  Administered 2019-09-01: 500 [IU] via INTRAVENOUS

## 2019-09-01 MED ORDER — SODIUM CHLORIDE 0.9% FLUSH
10.0000 mL | Freq: Once | INTRAVENOUS | Status: AC
  Administered 2019-09-01: 10 mL via INTRAVENOUS

## 2019-09-01 NOTE — Progress Notes (Signed)
Labs drawn from port.  Patients port flushed without difficulty.  Good blood return noted with no bruising or swelling noted at site.  Band aid applied.  VSS with discharge and left ambulatory with no s/s of distress noted.  

## 2019-09-02 LAB — CEA: CEA: 6.2 ng/mL — ABNORMAL HIGH (ref 0.0–4.7)

## 2019-09-02 LAB — CA 125: Cancer Antigen (CA) 125: 8.3 U/mL (ref 0.0–38.1)

## 2019-09-08 ENCOUNTER — Ambulatory Visit (HOSPITAL_COMMUNITY): Payer: Medicare Other | Admitting: Hematology

## 2019-09-11 ENCOUNTER — Inpatient Hospital Stay (HOSPITAL_BASED_OUTPATIENT_CLINIC_OR_DEPARTMENT_OTHER): Payer: Medicare Other | Admitting: Hematology

## 2019-09-11 ENCOUNTER — Encounter (HOSPITAL_COMMUNITY): Payer: Self-pay | Admitting: Hematology

## 2019-09-11 ENCOUNTER — Other Ambulatory Visit: Payer: Self-pay

## 2019-09-11 DIAGNOSIS — E785 Hyperlipidemia, unspecified: Secondary | ICD-10-CM | POA: Diagnosis not present

## 2019-09-11 DIAGNOSIS — Z862 Personal history of diseases of the blood and blood-forming organs and certain disorders involving the immune mechanism: Secondary | ICD-10-CM | POA: Diagnosis not present

## 2019-09-11 DIAGNOSIS — Z923 Personal history of irradiation: Secondary | ICD-10-CM | POA: Diagnosis not present

## 2019-09-11 DIAGNOSIS — C210 Malignant neoplasm of anus, unspecified: Secondary | ICD-10-CM | POA: Diagnosis not present

## 2019-09-11 DIAGNOSIS — Z86718 Personal history of other venous thrombosis and embolism: Secondary | ICD-10-CM | POA: Diagnosis not present

## 2019-09-11 DIAGNOSIS — Z85048 Personal history of other malignant neoplasm of rectum, rectosigmoid junction, and anus: Secondary | ICD-10-CM | POA: Diagnosis not present

## 2019-09-11 DIAGNOSIS — Z9221 Personal history of antineoplastic chemotherapy: Secondary | ICD-10-CM | POA: Diagnosis not present

## 2019-09-11 DIAGNOSIS — I10 Essential (primary) hypertension: Secondary | ICD-10-CM | POA: Diagnosis not present

## 2019-09-11 NOTE — Patient Instructions (Signed)
[facility] at [facility]  Discharge Instructions:  You saw Renee Nester, NP, today. _______________________________________________________________  Thank you for choosing [facility] at [facility] to provide your oncology and hematology care.  To afford each patient quality time with our providers, please arrive at least 15 minutes before your scheduled appointment.  You need to re-schedule your appointment if you arrive 10 or more minutes late.  We strive to give you quality time with our providers, and arriving late affects you and other patients whose appointments are after yours.  Also, if you no show three or more times for appointments you may be dismissed from the clinic.  Again, thank you for choosing [facility] at [facility]. Our hope is that these requests will allow you access to exceptional care and in a timely manner. _______________________________________________________________  If you have questions after your visit, please contact our office at [phone] between the hours of 8:30 a.m. and 5:00 p.m. Voicemails left after 4:30 p.m. will not be returned until the following business day. _______________________________________________________________  For prescription refill requests, have your pharmacy contact our office. _______________________________________________________________  Recommendations made by the consultant and any test results will be sent to your referring physician. _______________________________________________________________ 

## 2019-09-11 NOTE — Progress Notes (Signed)
[place], [place] [address]   CLINIC:  Medical Oncology/Hematology  PCP:  Susy Frizzle, MD [address] [place] [address] [phone]   REASON FOR VISIT:  Follow-up for Anal cancer   CURRENT THERAPY: Clinical surveillance  BRIEF ONCOLOGIC HISTORY:  Oncology History  History of ovarian cancer  06/02/2014 Procedure   Diagnostic laparoscopy, lysis of adhesions, biopsy of omental nodules, biopsy right ovary by Dr Dalbert Batman   06/04/2014 Pathology Results   Diagnosis 1. Omentum, biopsy, Anterior peritoneal & omental nodule - SEROUS CARCINOMA WITH ASSOCIATED ABUNDANT PSAMMOMA BODIES AND DESMOPLASTIC STROMAL REACTION CONSISTENT WITH INVASIVE IMPLANTS. - SEE COMMENT. 2. Omentum, biopsy, Omental nodule - SEROUS CARCINOMA WITH ASSOCIATED ABUNDANT PSAMMOMA BODIES AND DESMOPLASTIC STROMAL REACTION CONSISTENT WITH INVASIVE IMPLANTS. - SEE COMMENT. 3. Ovary, biopsy/wedge resection, Right ovary - SMALL FRAGMENTS OF ATYPICAL PAPILLARY PROLIFERATION WITH PSAMMOMA BODIES CONSISTENT WITH AT LEAST SEROUS BORDERLINE TUMOR.   07/03/2014 Procedure   Exploratory laparotomy, omentectomy, Bilateral salpingo-oophorectomy, inptraperitoneal PORT placement by Dr. Alycia Rossetti       07/24/2014 - 12/07/2014 Chemotherapy   Carboplatin/Paclitaxel x 6 cycles    08/17/2014 Procedure   Insertion of 8 French power port clear view tunneled venous vascular access device next line use of fluoroscopy for guidance and positioning Use of ultrasound for venipuncture Removal of intraperitoneal chemotherapy port By Dr. Dalbert Batman   05/14/2017 Genetic Testing   Negative genetic testing on the [facility] panel.  The [facility] gene panel offered by [employer] includes sequencing and deletion/duplication testing of the following 28 genes: APC, ATM, BARD1, BMPR1A, BRCA1, BRCA2, BRIP1, CHD1, CDK4, CDKN2A, CHEK2, EPCAM (large rearrangement only), MLH1, MSH2, MSH6,  MUTYH, NBN, PALB2, PMS2, PTEN, RAD51C, RAD51D, SMAD4, STK11, and TP53. Sequencing was performed for select regions of POLE and POLD1, and large rearrangement analysis was performed for select regions of GREM1. The report date is April 27, 2017.  HRD testing was performed on the ovarian tumor.  The tumor was negative for any deleterious BRCA1 or BRCA2 mutations.    Genomic instability status was not interpretable.  Therefore, [facility] was unable to analyze the ovarian tumor for genomic instability.  The report date is May 14, 2017.    Anal cancer ([place])  09/09/2015 Procedure   Rectovaginal septal mass biopsy by Dr. Marcello Moores   09/10/2015 Pathology Results   Soft tissue mass, biopsy, rectovaginal septal mass - SQUAMOUS CELL CARCINOMA.   10/15/2015 PET scan   1. Focal hypermetabolism with associated ill-defined soft tissue fullness at the junction of the anterior anal wall and posterior inferior vaginal wall, in keeping with the provided history of a primary anal carcinoma. 2. No hypermetabolic locoregional or distant metastatic disease. 3. Status post hysterectomy, with no abnormal findings at the vaginal cuff. No evidence of hypermetabolic peritoneal tumor. 4. Tiny nonobstructing bilateral renal stones.   10/25/2015 - 12/06/2015 Radiation Therapy   [place], [place]   10/25/2015 - 12/06/2015 Chemotherapy   Mitomycin C and 5FU, by Dr. Jacquiline Doe    06/15/2016 Procedure   EXCISION RECTOVAGINAOL FISTULA WITH MUCOSAL ADVANCEMENT FLAP by Dr. Marcello Moores   06/18/2016 Pathology Results   Fistula, rectovaginal SQUAMOUS, COLUMNAR JUNCTION MUCOSA AND SUBCUTANOUS SOFT TISSUE WITH FISTULA NO EVIDENCE OF MALIGNANCY   09/20/2016 Imaging   CT CAP- 1. No acute findings and no evidence for recurrent tumor or metastatic disease. 2. Aortic atherosclerosis and coronary artery calcification      CANCER STAGING: Cancer Staging Anal cancer [facility]) Staging form:  Anus, AJCC 8th Edition - Clinical: Stage IIA  (cT2, cN0, cM0) - Signed by Baird Cancer, PA-C on 09/27/2016    INTERVAL HISTORY:  Abigail Wagner 68 y.o. female presents today for follow-up.  She reports overall doing well.  She denies any significant fatigue.  Denies any change in her health history since her last visit.  No changes in bowel habits.  Appetite is stable.  No weight loss denies any fevers, chills, night sweats.  She is here for repeat labs and office visit.  REVIEW OF SYSTEMS:  Review of Systems  Constitutional: Negative for fatigue.  All other systems reviewed and are negative.    PAST MEDICAL/SURGICAL HISTORY:  Past Medical History:  Diagnosis Date  . Allergy   . Anal carcinoma [facility]) oncologist-  dr Whitney Muse ([facility])   dx 12/ 2016 [facility] --  chemo and radiation- Dr. Marcello Moores  . Anemia    due to her cancer  . Anxiety    Dr. Harrington Challenger at [place] (psychiatrist).    . Carcinoma of ovary, stage 3 [facility]) oncologist-  dr gehrig/  dr Doreene Eland (cancer center in [place] [facility])--  no recurrency   dx 09/ 2015  Stage IIIB  papillary ovarian carcinoma  s/p  omentectomy and BSO &  chemotherapy (completed 12-07-2014)  . Chemotherapy induced nausea and vomiting 10/23/2014  . Chronic kidney disease    stones  . DDD (degenerative disc disease), lumbosacral   . GERD (gastroesophageal reflux disease)   . Headache(784.0)   . History of acute respiratory failure    05-30-2009  drug overdose-- (intubated for 2 days)  and aspiration pneumonia  . History of adenomatous polyp of colon 10/07/2015   tubular adenoma high grade dysplasia  . History of herpes genitalis   . History of kidney stones   . History of ovarian cancer 08/03/2015  . History of suicide attempt    per documentation in epic  05-30-2009  overdose benaodiazepine  . HTN (hypertension)    takes Metoprolol daily  . Hyperlipidemia   . Hypertension 09/27/2016  . IBS (irritable bowel syndrome)    takes Bentyl daily  . Internal carotid artery stenosis, left    <50%,  ulcerative plaque at carotid bulb.   . Major depression   . Mood disorder ([place])   . OA (osteoarthritis)    both hips  . Ovarian cancer ([place])    2015-TAH/BSO  . Prolapse of vaginal vault after hysterectomy 07/20/2015  . Rectovaginal fistula 08/05/2015  . Sigmoid diverticulosis   . Smokers' cough [facility])    Past Surgical History:  Procedure Laterality Date  . BREAST ENHANCEMENT SURGERY  1986  . [place]  . COLONOSCOPY N/A 10/07/2015   Procedure: COLONOSCOPY;  Surgeon: Rogene Houston, MD;  [facility];  Service: Endoscopy;  Laterality: N/A;  7:30  . EXPLORATORY LAPAROTOMY/ OMENTECTOMY/  BILATERAL SALPINGOOPHORECTOMY/  [facility] PLACEMENT  07-03-2014   [facility]  . KNEE ARTHROSCOPY Left 09-22-2004  . LAPAROSCOPY N/A 06/02/2014   Procedure: DIAGNOSTIC LAPAROSCOPY, OMENTAL BIOPSY, RIGHT OVARY BIOPSY, LYSIS OF ADHESIONS;  Surgeon: Fanny Skates, MD;  Location: [place];  Service: General;  Laterality: N/A;  . MUCOSAL ADVANCEMENT FLAP N/A 06/15/2016   Procedure: EXCISION RECTOVAGINAOL FISTULA WITH MUCOSAL ADVANCEMENT FLAP;  Surgeon: Leighton Ruff, MD;  Location: [place];  Service: General;  Laterality: N/A;  . PLACEMENT OF SETON N/A 09/09/2015   Procedure: PLACEMENT OF SETON;  Surgeon: Leighton Ruff, MD;  Location: [facility];  Service: General;  Laterality: N/A;  . PORT-A-CATH REMOVAL Right 08/17/2014   Procedure: REMOVAL INTRAPERITONEAL CHEMO PORT;  Surgeon: Fanny Skates, MD;  Location: [place];  Service: General;  Laterality: Right;  . PORTACATH PLACEMENT Right 08/17/2014   Procedure:  PLACE NEW PORT A CATH;  Surgeon: Fanny Skates, MD;  Location: [place];  Service: General;  Laterality: Right;  . RECTAL BIOPSY N/A 09/09/2015   Procedure: BIOPSY OF RECTOVAGINAL MASS;  Surgeon: Leighton Ruff, MD;  Location: [place];  Service: General;  Laterality: N/A;  . TUBAL LIGATION  YRS  AGO  . VAGINAL HYSTERECTOMY  1981   fibroids     SOCIAL HISTORY:  Social History   Socioeconomic History  . Marital status: Married    Spouse name: Coralyn Mark  . Number of children: 3  . Years of education: 63  . Highest education level: Not on file  Occupational History  . Occupation: retire    Comment: [employer]  Tobacco Use  . Smoking status: Current Every Day Smoker    Packs/day: 0.50    Years: 43.00    Pack years: 21.50    Types: Cigarettes  . Smokeless tobacco: Never Used  Substance and Sexual Activity  . Alcohol use: No  . Drug use: No  . Sexual activity: Not Currently    Birth control/protection: Surgical  Other Topics Concern  . Not on file  Social History Narrative   Lives at home with Coralyn Mark   Retired [place] Strain:   . Difficulty of Paying Living Expenses: Not on file  Food Insecurity:   . Worried About Charity fundraiser in the Last Year: Not on file  . Ran Out of Food in the Last Year: Not on file  Transportation Needs:   . Lack of Transportation (Medical): Not on file  . Lack of Transportation (Non-Medical): Not on file  Physical Activity:   . Days of Exercise per Week: Not on file  . Minutes of Exercise per Session: Not on file  Stress:   . Feeling of Stress : Not on file  Social Connections:   . Frequency of Communication with Friends and Family: Not on file  . Frequency of Social Gatherings with Friends and Family: Not on file  . Attends Religious Services: Not on file  . Active Member of Clubs or Organizations: Not on file  . Attends Archivist Meetings: Not on file  . Marital Status: Not on file  Intimate Partner Violence:   . Fear of Current or Ex-Partner: Not on file  . Emotionally Abused: Not on file  . Physically Abused: Not on file  . Sexually Abused: Not on file    FAMILY HISTORY:  Family History  Problem Relation Age of Onset  . Bipolar disorder Mother   .  Anxiety disorder Mother   . Dementia Mother   . Depression Mother   . Mental illness Mother   . Vision loss Mother   . Alzheimer's disease Mother   . Alcohol abuse Paternal Uncle   . Bipolar disorder Maternal Grandmother   . Dementia Maternal Grandmother   . Alzheimer's disease Maternal Grandmother   . Alcohol abuse Paternal Uncle   . Stroke Brother   . Deep vein thrombosis Son   . Heart disease Father   . Hyperlipidemia Father   . Hypertension Father   . Stroke Father   .  Vision loss Father   . Atrial fibrillation Sister   . Heart disease Brother   . Heart disease Brother   . ADD / ADHD Neg Hx   . Drug abuse Neg Hx   . OCD Neg Hx   . Paranoid behavior Neg Hx   . Schizophrenia Neg Hx   . Seizures Neg Hx   . Sexual abuse Neg Hx   . Physical abuse Neg Hx     CURRENT MEDICATIONS:  Outpatient Encounter Medications as of 09/11/2019  Medication Sig  . alclomethasone (ACLOVATE) 0.05 % cream APPLY TO AFFECTED AREA TWICE A DAY AS NEEDED  . b complex vitamins tablet Take 1 tablet by mouth daily.  . Coenzyme Q10 (CO Q 10) 10 MG CAPS Take by mouth daily.  . divalproex (DEPAKOTE ER) 500 MG 24 hr tablet Take 1 tablet (500 mg total) by mouth daily.  Marland Kitchen escitalopram (LEXAPRO) 20 MG tablet Take 1 tablet (20 mg total) by mouth 2 (two) times daily.  . fluorometholone (FML) 0.1 % ophthalmic suspension INSTILL 1 DROP INTO EACH EYE TWICE DAILY FOR 14 DAYS  . HORSE CHESTNUT PO Take by mouth.  Marland Kitchen HYDROcodone-acetaminophen (NORCO) 5-325 MG tablet Take 1 tablet by mouth every 4 (four) hours as needed for moderate pain.  Marland Kitchen ketoconazole (NIZORAL) 2 % cream Apply 1 application topically as needed. prn   . KRILL OIL PO Take 350 mg by mouth daily.  . lansoprazole (PREVACID) 15 MG capsule Take 15 mg by mouth as needed.   Marland Kitchen LORazepam (ATIVAN) 1 MG tablet Take 1 tablet (1 mg total) by mouth 3 (three) times daily as needed for anxiety.  Marland Kitchen losartan (COZAAR) 50 MG tablet TAKE 1 TABLET BY MOUTH EVERY DAY  .  meloxicam (MOBIC) 15 MG tablet TAKE 1 TABLET BY MOUTH EVERY DAY  . Multiple Vitamin (MULTIVITAMIN) tablet Take 1 tablet by mouth daily.  . rosuvastatin (CRESTOR) 20 MG tablet TAKE 1 TABLET BY MOUTH EVERY DAY  . valACYclovir (VALTREX) 1000 MG tablet Take 1 tablet (1,000 mg total) by mouth daily as needed.   No facility-administered encounter medications on file as of 09/11/2019.    ALLERGIES:  No Known Allergies   PHYSICAL EXAM:  ECOG Performance status: 1  Vitals:   09/11/19 1357  BP: 104/74  Pulse: 81  Resp: 16  Temp: (!) 96.9 F (36.1 C)  SpO2: 100%   Filed Weights   09/11/19 1357  Weight: 155 lb 3.2 oz (70.4 kg)    Physical Exam Constitutional:      Appearance: Normal appearance.  HENT:     Head: Normocephalic.     Right Ear: External ear normal.     Left Ear: External ear normal.  Eyes:     Conjunctiva/sclera: Conjunctivae normal.  Cardiovascular:     Rate and Rhythm: Normal rate and regular rhythm.     Pulses: Normal pulses.     Heart sounds: Normal heart sounds.  Pulmonary:     Effort: Pulmonary effort is normal.     Breath sounds: Normal breath sounds.  Abdominal:     General: Bowel sounds are normal.  Musculoskeletal:        General: Normal range of motion.     Cervical back: Normal range of motion.  Skin:    General: Skin is warm.  Neurological:     General: No focal deficit present.     Mental Status: She is alert and oriented to person, place, and time.  Psychiatric:  Mood and Affect: Mood normal.        Behavior: Behavior normal.      LABORATORY DATA:  I have reviewed the labs as listed.  CBC    Component Value Date/Time   WBC 3.7 (L) 09/01/2019 1048   RBC 3.94 09/01/2019 1048   HGB 12.3 09/01/2019 1048   HCT 37.7 09/01/2019 1048   PLT 183 09/01/2019 1048   MCV 95.7 09/01/2019 1048   MCH 31.2 09/01/2019 1048   MCHC 32.6 09/01/2019 1048   RDW 14.4 09/01/2019 1048   LYMPHSABS 1.1 09/01/2019 1048   MONOABS 0.3 09/01/2019 1048    EOSABS 0.2 09/01/2019 1048   BASOSABS 0.0 09/01/2019 1048   CMP Latest Ref Rng & Units 09/01/2019 08/29/2019 03/03/2019  Glucose 70 - 99 mg/dL 112(H) 92 93  BUN 8 - 23 mg/dL '15 15 14  '$ Creatinine 0.44 - 1.00 mg/dL 0.66 0.76 0.67  Sodium 135 - 145 mmol/L 136 142 139  Potassium 3.5 - 5.1 mmol/L 4.2 4.7 4.3  Chloride 98 - 111 mmol/L 100 106 101  CO2 22 - 32 mmol/L '25 28 25  '$ Calcium 8.9 - 10.3 mg/dL 9.5 9.6 9.4  Total Protein 6.5 - 8.1 g/dL 6.5 6.5 6.6  Total Bilirubin 0.3 - 1.2 mg/dL 0.5 0.3 0.4  Alkaline Phos 38 - 126 U/L 45 - 46  AST 15 - 41 U/L 25 23 33  ALT 0 - 44 U/L '16 13 23        '$ ASSESSMENT & PLAN:   Anal cancer ([facility]) 1.  Stage II (T2 N0 M0) anal cancer: -Diagnosed in December 2016. -Status post chemoradiation therapy with 5-FU and mitomycin-C finished on 12/06/2015.  [facility] - She had a follow-up appointment with Dr. Marcello Moores 3 months ago for anoscopy which was reportedly normal. -She denies any bleeding per rectum or melena. -Physical exam today did not reveal any inguinal adenopathy.  No other suspicious physical exam findings today. -We will see her back in 6 months for follow-up with repeat blood work and physical exam.  2.  Stage III ovarian cancer: - Diagnosed in 2015, status post debulking surgery. -She was treated with 6 cycles of carboplatin and paclitaxel completed in March 2016. -Does not report any bloating sensation or abdominal discomfort/pain. - We have reviewed her blood work which shows Ca1 25 level is WNL.  -We will see her back in 6 months with repeat blood work and physical exam.      [place] [phone]

## 2019-09-11 NOTE — Assessment & Plan Note (Signed)
1.  Stage II (T2 N0 M0) anal cancer: -Diagnosed in December 2016. -Status post chemoradiation therapy with 5-FU and mitomycin-C finished on 12/06/2015.  She was treated at [facility]. - She had a follow-up appointment with Dr. Marcello Moores 3 months ago for anoscopy which was reportedly normal. -She denies any bleeding per rectum or melena. -Physical exam today did not reveal any inguinal adenopathy.  No other suspicious physical exam findings today. -We will see her back in 6 months for follow-up with repeat blood work and physical exam.  2.  Stage III ovarian cancer: - Diagnosed in 2015, status post debulking surgery. -She was treated with 6 cycles of carboplatin and paclitaxel completed in March 2016. -Does not report any bloating sensation or abdominal discomfort/pain. - We have reviewed her blood work which shows Ca1 25 level is WNL.  -We will see her back in 6 months with repeat blood work and physical exam.

## 2019-09-12 ENCOUNTER — Other Ambulatory Visit: Payer: Self-pay | Admitting: Family Medicine

## 2019-09-12 MED ORDER — HYDROCODONE-ACETAMINOPHEN 5-325 MG PO TABS: 1.0000 | ORAL_TABLET | ORAL | 0 refills | Status: DC | PRN

## 2019-09-12 NOTE — Telephone Encounter (Signed)
Patient is requesting a refill on Hydrocodone   LOV: 08/08/19  LRF:   08/08/19

## 2019-09-23 ENCOUNTER — Encounter (HOSPITAL_COMMUNITY): Payer: Self-pay | Admitting: Hematology

## 2019-09-23 ENCOUNTER — Encounter (HOSPITAL_COMMUNITY): Payer: Self-pay | Admitting: *Deleted

## 2019-09-27 ENCOUNTER — Other Ambulatory Visit: Payer: Self-pay | Admitting: Family Medicine

## 2019-10-13 ENCOUNTER — Encounter (HOSPITAL_COMMUNITY): Payer: Self-pay | Admitting: Psychiatry

## 2019-10-13 ENCOUNTER — Ambulatory Visit (INDEPENDENT_AMBULATORY_CARE_PROVIDER_SITE_OTHER): Payer: Medicare Other | Admitting: Psychiatry

## 2019-10-13 ENCOUNTER — Other Ambulatory Visit: Payer: Self-pay

## 2019-10-13 DIAGNOSIS — F331 Major depressive disorder, recurrent, moderate: Secondary | ICD-10-CM

## 2019-10-13 MED ORDER — ESCITALOPRAM OXALATE 20 MG PO TABS: 20.0000 mg | ORAL_TABLET | Freq: Two times a day (BID) | ORAL | 1 refills | Status: DC

## 2019-10-13 MED ORDER — DIVALPROEX SODIUM ER 500 MG PO TB24: 500.0000 mg | ORAL_TABLET | Freq: Every day | ORAL | 3 refills | Status: DC

## 2019-10-13 MED ORDER — LORAZEPAM 1 MG PO TABS: 1.0000 mg | ORAL_TABLET | Freq: Three times a day (TID) | ORAL | 3 refills | Status: DC | PRN

## 2019-10-13 NOTE — Progress Notes (Signed)
Virtual Visit via Telephone Note  I connected with Abigail Wagner on 10/13/19 at 11:00 AM EST by telephone and verified that I am speaking with the correct person using two identifiers.   I discussed the limitations, risks, security and privacy concerns of performing an evaluation and management service by telephone and the availability of in person appointments. I also discussed with the patient that there may be a patient responsible charge related to this service. The patient expressed understanding and agreed to proceed.     I discussed the assessment and treatment plan with the patient. The patient was provided an opportunity to ask questions and all were answered. The patient agreed with the plan and demonstrated an understanding of the instructions.   The patient was advised to call back or seek an in-person evaluation if the symptoms worsen or if the condition fails to improve as anticipated.  I provided 15 minutes of non-face-to-face time during this encounter.   [name], MD  [facility] MD/PA/NP OP Progress Note  10/13/2019 11:14 AM Abigail Wagner GAGE NAGLE  MRN:  [id number]  Chief Complaint:  Chief Complaint    Anxiety; Depression; Manic Behavior; Follow-up     HPI: This patient is a 69 year old married white female lives with her husband in [place]. She has 3 children and 5 grandchildren. She is retired from [employer].  The patient states she's had depression since her 20s. She was hospitalized several times, last time being in 2010 when she took a drug overdose. She was going through a lot of family stress back then. She states that she was hospitalized [facility] and since then she has never wanted to go back. She's been quite stable despite her low doses of medication. She's also stopped drinking alcohol which is made a big difference  The patient returns after 4 months.  She states overall she is doing okay but lately has been "angry and confused" about the recent  presidential election and all the turmoil in the country.  She states that she really cannot do anything about it except pray.  Her personal life is going well and she and her husband are getting along well.  His health is improving and they are getting out and working in the yard.  Her mood has been stable and she is neither been depressed nor manic.  She states that she is "the best I have been in many years."  She has had no recurrence of cancer.  She thinks that her medications continue to help her depression and anxiety. Visit Diagnosis:    ICD-10-CM   1. Major depressive disorder, recurrent episode, moderate (HCC)  F33.1 escitalopram (LEXAPRO) 20 MG tablet    Past Psychiatric History: Past hospitalizations for depression and alcohol abuse, none since 2010  Past Medical History:  Past Medical History:  Diagnosis Date  . Allergy   . Anal carcinoma [facility]) oncologist-  dr Whitney Muse ([facility])   dx 12/ 2016 [facility] --  chemo and radiation- Dr. Marcello Moores  . Anemia    due to her cancer  . Anxiety    Dr. Harrington Challenger at [facility] (psychiatrist).    . Carcinoma of ovary, stage 3 [facility]) oncologist-  dr gehrig/  dr Doreene Eland (cancer center in [place])--  no recurrency   dx 09/ 2015  Stage IIIB  papillary ovarian carcinoma  s/p  omentectomy and BSO &  chemotherapy (completed 12-07-2014)  . Chemotherapy induced nausea and vomiting 10/23/2014  . Chronic kidney disease  stones  . DDD (degenerative disc disease), lumbosacral   . GERD (gastroesophageal reflux disease)   . Headache(784.0)   . History of acute respiratory failure    05-30-2009  drug overdose-- (intubated for 2 days)  and aspiration pneumonia  . History of adenomatous polyp of colon 10/07/2015   tubular adenoma high grade dysplasia  . History of herpes genitalis   . History of kidney stones   . History of ovarian cancer 08/03/2015  . History of suicide attempt    per documentation in epic  05-30-2009  overdose benaodiazepine  . HTN  (hypertension)    takes Metoprolol daily  . Hyperlipidemia   . Hypertension 09/27/2016  . IBS (irritable bowel syndrome)    takes Bentyl daily  . Internal carotid artery stenosis, left    <50%, ulcerative plaque at carotid bulb.   . Major depression   . Mood disorder ([place])   . OA (osteoarthritis)    both hips  . Ovarian cancer ([place])    2015-TAH/BSO  . Prolapse of vaginal vault after hysterectomy 07/20/2015  . Rectovaginal fistula 08/05/2015  . Sigmoid diverticulosis   . Smokers' cough [facility])     Past Surgical History:  Procedure Laterality Date  . BREAST ENHANCEMENT SURGERY  1986  . [place]  . COLONOSCOPY N/A 10/07/2015   Procedure: COLONOSCOPY;  Surgeon: Rogene Houston, MD;  [facility];  Service: Endoscopy;  Laterality: N/A;  7:30  . EXPLORATORY LAPAROTOMY/ OMENTECTOMY/  BILATERAL SALPINGOOPHORECTOMY/  [facility] PLACEMENT  07-03-2014   [facility]  . KNEE ARTHROSCOPY Left 09-22-2004  . LAPAROSCOPY N/A 06/02/2014   Procedure: DIAGNOSTIC LAPAROSCOPY, OMENTAL BIOPSY, RIGHT OVARY BIOPSY, LYSIS OF ADHESIONS;  Surgeon: Fanny Skates, MD;  Location: [place];  Service: General;  Laterality: N/A;  . MUCOSAL ADVANCEMENT FLAP N/A 06/15/2016   Procedure: EXCISION RECTOVAGINAOL FISTULA WITH MUCOSAL ADVANCEMENT FLAP;  Surgeon: Leighton Ruff, MD;  Location: [place];  Service: General;  Laterality: N/A;  . PLACEMENT OF SETON N/A 09/09/2015   Procedure: PLACEMENT OF SETON;  Surgeon: Leighton Ruff, MD;  Location: [place];  Service: General;  Laterality: N/A;  . PORT-A-CATH REMOVAL Right 08/17/2014   Procedure: REMOVAL INTRAPERITONEAL CHEMO PORT;  Surgeon: Fanny Skates, MD;  Location: [place];  Service: General;  Laterality: Right;  . PORTACATH PLACEMENT Right 08/17/2014   Procedure:  PLACE NEW PORT A CATH;  Surgeon: Fanny Skates, MD;  Location: [place];  Service: General;  Laterality: Right;  .  RECTAL BIOPSY N/A 09/09/2015   Procedure: BIOPSY OF RECTOVAGINAL MASS;  Surgeon: Leighton Ruff, MD;  [place];  Service: General;  Laterality: N/A;  . TUBAL LIGATION  YRS AGO  . VAGINAL HYSTERECTOMY  1981   fibroids    Family Psychiatric History: see below  Family History:  Family History  Problem Relation Age of Onset  . Bipolar disorder Mother   . Anxiety disorder Mother   . Dementia Mother   . Depression Mother   . Mental illness Mother   . Vision loss Mother   . Alzheimer's disease Mother   . Alcohol abuse Paternal Uncle   . Bipolar disorder Maternal Grandmother   . Dementia Maternal Grandmother   . Alzheimer's disease Maternal Grandmother   . Alcohol abuse Paternal Uncle   . Stroke Brother   . Deep vein thrombosis Son   . Heart disease Father   . Hyperlipidemia Father   . Hypertension Father   .  Stroke Father   . Vision loss Father   . Atrial fibrillation Sister   . Heart disease Brother   . Heart disease Brother   . ADD / ADHD Neg Hx   . Drug abuse Neg Hx   . OCD Neg Hx   . Paranoid behavior Neg Hx   . Schizophrenia Neg Hx   . Seizures Neg Hx   . Sexual abuse Neg Hx   . Physical abuse Neg Hx     Social History:  Social History   Socioeconomic History  . Marital status: Married    Spouse name: Coralyn Mark  . Number of children: 3  . Years of education: 60  . Highest education level: Not on file  Occupational History  . Occupation: retire    Comment: [employer]  Tobacco Use  . Smoking status: Current Every Day Smoker    Packs/day: 0.50    Years: 43.00    Pack years: 21.50    Types: Cigarettes  . Smokeless tobacco: Never Used  Substance and Sexual Activity  . Alcohol use: No  . Drug use: No  . Sexual activity: Not Currently    Birth control/protection: Surgical  Other Topics Concern  . Not on file  Social History Narrative   Lives at home with Coralyn Mark   Retired [employer] Strain:   . Difficulty of Paying Living Expenses: Not on file  Food Insecurity:   . Worried About Charity fundraiser in the Last Year: Not on file  . Ran Out of Food in the Last Year: Not on file  Transportation Needs:   . Lack of Transportation (Medical): Not on file  . Lack of Transportation (Non-Medical): Not on file  Physical Activity:   . Days of Exercise per Week: Not on file  . Minutes of Exercise per Session: Not on file  Stress:   . Feeling of Stress : Not on file  Social Connections:   . Frequency of Communication with Friends and Family: Not on file  . Frequency of Social Gatherings with Friends and Family: Not on file  . Attends Religious Services: Not on file  . Active Member of Clubs or Organizations: Not on file  . Attends Archivist Meetings: Not on file  . Marital Status: Not on file    Allergies: No Known Allergies  Metabolic Disorder Labs: No results found for: HGBA1C, MPG No results found for: PROLACTIN Lab Results  Component Value Date   CHOL 154 08/29/2019   TRIG 63 08/29/2019   HDL 53 08/29/2019   CHOLHDL 2.9 08/29/2019   VLDL 28 01/10/2017   LDLCALC 86 08/29/2019   LDLCALC 153 (H) 05/21/2018     Therapeutic Level Labs: No results found for: LITHIUM No results found for: VALPROATE No components found for:  CBMZ  Current Medications: Current Outpatient Medications  Medication Sig Dispense Refill  . alclomethasone (ACLOVATE) 0.05 % cream APPLY TO AFFECTED AREA TWICE A DAY AS NEEDED    . b complex vitamins tablet Take 1 tablet by mouth daily.    . Coenzyme Q10 (CO Q 10) 10 MG CAPS Take by mouth daily.    . divalproex (DEPAKOTE ER) 500 MG 24 hr tablet Take 1 tablet (500 mg total) by mouth daily. 90 tablet 3  . escitalopram (LEXAPRO) 20 MG tablet Take 1 tablet (20 mg total) by mouth 2 (two) times daily. 180 tablet 1  . fluorometholone (FML) 0.1 %  ophthalmic suspension INSTILL 1 DROP INTO EACH EYE TWICE DAILY FOR 14 DAYS    .  HORSE CHESTNUT PO Take by mouth.    Marland Kitchen HYDROcodone-acetaminophen (NORCO) 5-325 MG tablet Take 1 tablet by mouth every 4 (four) hours as needed for moderate pain. 60 tablet 0  . ketoconazole (NIZORAL) 2 % cream Apply 1 application topically as needed. prn     . KRILL OIL PO Take 350 mg by mouth daily.    . lansoprazole (PREVACID) 15 MG capsule Take 15 mg by mouth as needed.     Marland Kitchen LORazepam (ATIVAN) 1 MG tablet Take 1 tablet (1 mg total) by mouth 3 (three) times daily as needed for anxiety. 90 tablet 3  . losartan (COZAAR) 50 MG tablet TAKE 1 TABLET BY MOUTH EVERY DAY 90 tablet 3  . meloxicam (MOBIC) 15 MG tablet TAKE 1 TABLET BY MOUTH EVERY DAY 30 tablet 2  . Multiple Vitamin (MULTIVITAMIN) tablet Take 1 tablet by mouth daily.    . rosuvastatin (CRESTOR) 20 MG tablet TAKE 1 TABLET BY MOUTH EVERY DAY 90 tablet 1  . valACYclovir (VALTREX) 1000 MG tablet Take 1 tablet (1,000 mg total) by mouth daily as needed. 5 tablet 11   No current facility-administered medications for this visit.     Musculoskeletal: Strength & Muscle Tone: within normal limits Gait & Station: normal Patient leans: N/A  Psychiatric Specialty Exam: Review of Systems  Psychiatric/Behavioral: The patient is nervous/anxious.   All other systems reviewed and are negative.   There were no vitals taken for this visit.There is no height or weight on file to calculate BMI.  General Appearance: NA  Eye Contact:  NA  Speech:  Clear and Coherent  Volume:  Normal  Mood:  Anxious  Affect:  NA  Thought Process:  Goal Directed  Orientation:  Full (Time, Place, and Person)  Thought Content: Rumination   Suicidal Thoughts:  No  Homicidal Thoughts:  No  Memory:  Immediate;   Good Recent;   Good Remote;   Fair  Judgement:  Good  Insight:  Fair  Psychomotor Activity:  Normal  Concentration:  Concentration: Good and Attention Span: Good  Recall:  Good  Fund of Knowledge: Good  Language: Good  Akathisia:  No  Handed:  Right   AIMS (if indicated): not done  Assets:  Communication Skills Desire for Improvement Resilience Social Support Talents/Skills  ADL's:  Intact  Cognition: WNL  Sleep:  Good   Screenings: PHQ2-9     Office Visit from 05/14/2018 in [facility] Office Visit from 01/15/2018 in [facility] from 01/08/2017 in [facility] Visit from 11/07/2016 in [facility]  PHQ-2 Total Score  1  1  0  0  PHQ-9 Total Score  4  --  --  --       Assessment and Plan: This patient is a 69 year old female with a history of depression and a remote history of alcohol abuse and anxiety.  She continues to do well on her current regimen.  She will continue Ativan 1 mg 3 times daily as needed for anxiety, Lexapro 20 mg twice daily for depression and Depakote ER 500 mg at bedtime for mood stabilization.  She will return to see me in 4 months   [name], MD 10/13/2019, 11:14 AM

## 2019-10-17 ENCOUNTER — Other Ambulatory Visit: Payer: Self-pay | Admitting: Family Medicine

## 2019-10-17 MED ORDER — HYDROCODONE-ACETAMINOPHEN 5-325 MG PO TABS: 1.0000 | ORAL_TABLET | ORAL | 0 refills | Status: DC | PRN

## 2019-10-17 NOTE — Telephone Encounter (Signed)
Patient is requesting a refill on Hydrocodone   LOV: 08/08/19  LRF:   [id number]

## 2019-11-24 ENCOUNTER — Other Ambulatory Visit: Payer: Self-pay | Admitting: Family Medicine

## 2019-11-24 NOTE — Telephone Encounter (Signed)
Patient is requesting a refill on Hydrocodone   LOV: 08/08/2019  LRF: 10/17/2019

## 2019-11-25 MED ORDER — HYDROCODONE-ACETAMINOPHEN 5-325 MG PO TABS: 1.0000 | ORAL_TABLET | ORAL | 0 refills | Status: DC | PRN

## 2019-12-01 ENCOUNTER — Encounter (HOSPITAL_COMMUNITY): Payer: Medicare Other

## 2019-12-08 ENCOUNTER — Other Ambulatory Visit: Payer: Self-pay

## 2019-12-08 ENCOUNTER — Inpatient Hospital Stay (HOSPITAL_COMMUNITY): Payer: Medicare Other | Attending: Hematology

## 2019-12-08 DIAGNOSIS — Z452 Encounter for adjustment and management of vascular access device: Secondary | ICD-10-CM | POA: Diagnosis not present

## 2019-12-08 DIAGNOSIS — Z85048 Personal history of other malignant neoplasm of rectum, rectosigmoid junction, and anus: Secondary | ICD-10-CM | POA: Diagnosis not present

## 2019-12-08 MED ORDER — SODIUM CHLORIDE 0.9% FLUSH
10.0000 mL | Freq: Once | INTRAVENOUS | Status: AC
  Administered 2019-12-08: 10 mL

## 2019-12-08 MED ORDER — HEPARIN SOD (PORK) LOCK FLUSH 100 UNIT/ML IV SOLN
500.0000 [IU] | Freq: Once | INTRAVENOUS | Status: AC
  Administered 2019-12-08: 500 [IU] via INTRAVENOUS

## 2019-12-08 NOTE — Progress Notes (Signed)
Abigail Wagner presented for Portacath access and flush. Portacath located in the right chest wall accessed with  H 20 needle. Clean, Dry and Intact Good blood return present. Portacath flushed with 43ml NS and 500U/70ml Heparin per protocol and needle removed intact. Procedure without incident. Patient tolerated procedure well. Treatment given today per MD orders. No complaints at this time. Discharged from clinic ambulatory. F/U with [facility] as scheduled.

## 2019-12-08 NOTE — Patient Instructions (Signed)
[facility]  Discharge Instructions:   _______________________________________________________________  Thank you for choosing [facility] at [facility] to provide your oncology and hematology care.  To afford each patient quality time with our providers, please arrive at least 15 minutes before your scheduled appointment.  You need to re-schedule your appointment if you arrive 10 or more minutes late.  We strive to give you quality time with our providers, and arriving late affects you and other patients whose appointments are after yours.  Also, if you no show three or more times for appointments you may be dismissed from the clinic.  Again, thank you for choosing [facility] at [facility]. Our hope is that these requests will allow you access to exceptional care and in a timely manner. _______________________________________________________________  If you have questions after your visit, please contact our office at [phone] between the hours of 8:30 a.m. and 5:00 p.m. Voicemails left after 4:30 p.m. will not be returned until the following business day. _______________________________________________________________  For prescription refill requests, have your pharmacy contact our office. _______________________________________________________________  Recommendations made by the consultant and any test results will be sent to your referring physician. _______________________________________________________________ 

## 2019-12-23 ENCOUNTER — Other Ambulatory Visit: Payer: Self-pay | Admitting: Family Medicine

## 2020-01-07 ENCOUNTER — Other Ambulatory Visit: Payer: Self-pay | Admitting: Family Medicine

## 2020-01-07 NOTE — Telephone Encounter (Signed)
Patient is requesting a refill on Hydrocodone   LOV: 09/01/2019  LRF:    11/25/2019

## 2020-01-08 MED ORDER — HYDROCODONE-ACETAMINOPHEN 5-325 MG PO TABS: 1.0000 | ORAL_TABLET | ORAL | 0 refills | Status: DC | PRN

## 2020-02-09 ENCOUNTER — Other Ambulatory Visit: Payer: Self-pay | Admitting: *Deleted

## 2020-02-09 MED ORDER — HYDROCODONE-ACETAMINOPHEN 5-325 MG PO TABS: 1.0000 | ORAL_TABLET | ORAL | 0 refills | Status: DC | PRN

## 2020-02-09 NOTE — Telephone Encounter (Signed)
Received call from patient.   Requested refill on Hydrocodone/APAP.   Ok to refill??  Last office visit 08/08/2019.  Last refill 01/08/2020.

## 2020-02-10 ENCOUNTER — Encounter (HOSPITAL_COMMUNITY): Payer: Self-pay | Admitting: Psychiatry

## 2020-02-10 ENCOUNTER — Other Ambulatory Visit: Payer: Self-pay

## 2020-02-10 ENCOUNTER — Telehealth (INDEPENDENT_AMBULATORY_CARE_PROVIDER_SITE_OTHER): Payer: Medicare Other | Admitting: Psychiatry

## 2020-02-10 DIAGNOSIS — F331 Major depressive disorder, recurrent, moderate: Secondary | ICD-10-CM | POA: Diagnosis not present

## 2020-02-10 MED ORDER — LORAZEPAM 1 MG PO TABS: 1.0000 mg | ORAL_TABLET | Freq: Three times a day (TID) | ORAL | 5 refills | Status: DC | PRN

## 2020-02-10 MED ORDER — DIVALPROEX SODIUM ER 500 MG PO TB24: 500.0000 mg | ORAL_TABLET | Freq: Every day | ORAL | 3 refills | Status: DC

## 2020-02-10 MED ORDER — ESCITALOPRAM OXALATE 20 MG PO TABS: 20.0000 mg | ORAL_TABLET | Freq: Two times a day (BID) | ORAL | 2 refills | Status: DC

## 2020-02-10 NOTE — Progress Notes (Signed)
Virtual Visit via Telephone Note  I connected with Abigail Wagner on 02/10/20 at 11:00 AM EDT by telephone and verified that I am speaking with the correct person using two identifiers.   I discussed the limitations, risks, security and privacy concerns of performing an evaluation and management service by telephone and the availability of in person appointments. I also discussed with the patient that there may be a patient responsible charge related to this service. The patient expressed understanding and agreed to proceed.    I discussed the assessment and treatment plan with the patient. The patient was provided an opportunity to ask questions and all were answered. The patient agreed with the plan and demonstrated an understanding of the instructions.   The patient was advised to call back or seek an in-person evaluation if the symptoms worsen or if the condition fails to improve as anticipated.  I provided 15 minutes of non-face-to-face time during this encounter.   [name], MD  [facility] MD/PA/NP OP Progress Note  02/10/2020 11:11 AM Abigail Wagner Abigail Wagner  MRN:  [id number]  Chief Complaint:  Chief Complaint    Depression; Anxiety; Follow-up     HPI: This patient is a 69 year old married white female lives with her husband in [place]. She has 3 children and 5 grandchildren. She is retired from [employer].  The patient states she's had depression since her 17s. She was hospitalized several times, last time being in 2010 when she took a drug overdose. She was going through a lot of family stress back then. She states that she was [facility] and since then she has never wanted to go back. She's been quite stable despite her low doses of medication. She's also stopped drinking alcohol which is made a big difference  The patient returns for follow-up after 4 months.  She states that overall she is doing well.  She has had no recurrence of cancer.  Her health has been  stable other than some back pain.  Her mood has been good and she is sleeping well.  Her energy is fairly good and she is helping her husband do some gardening.  She denies any thoughts of self-harm or suicide.  Occasionally she feels anxious particularly if she watches the news.  She feels that her medications have been working well for her Visit Diagnosis:    ICD-10-CM   1. Major depressive disorder, recurrent episode, moderate (HCC)  F33.1 escitalopram (LEXAPRO) 20 MG tablet    Past Psychiatric History: Past hospitalizations for depression and alcohol abuse, none since 2010  Past Medical History:  Past Medical History:  Diagnosis Date  . Allergy   . Anal carcinoma [facility]) oncologist-  dr Whitney Muse ([facility])   dx 12/ 2016 [facility] --  chemo and radiation- Dr. Marcello Moores  . Anemia    due to her cancer  . Anxiety    Dr. Harrington Challenger at [facility] (psychiatrist).    . Carcinoma of ovary, stage 3 [facility]) oncologist-  dr gehrig/  dr Doreene Eland (cancer center in [place] [facility])--  no recurrency   dx 09/ 2015  Stage IIIB  papillary ovarian carcinoma  s/p  omentectomy and BSO &  chemotherapy (completed 12-07-2014)  . Chemotherapy induced nausea and vomiting 10/23/2014  . Chronic kidney disease    stones  . DDD (degenerative disc disease), lumbosacral   . GERD (gastroesophageal reflux disease)   . Headache(784.0)   . History of acute respiratory failure    05-30-2009  drug overdose-- (intubated for 2 days)  and aspiration pneumonia  . History of adenomatous polyp of colon 10/07/2015   tubular adenoma high grade dysplasia  . History of herpes genitalis   . History of kidney stones   . History of ovarian cancer 08/03/2015  . History of suicide attempt    per documentation in epic  05-30-2009  overdose benaodiazepine  . HTN (hypertension)    takes Metoprolol daily  . Hyperlipidemia   . Hypertension 09/27/2016  . IBS (irritable bowel syndrome)    takes Bentyl daily  . Internal carotid artery stenosis,  left    <50%, ulcerative plaque at carotid bulb.   . Major depression   . Mood disorder ([place])   . OA (osteoarthritis)    both hips  . Ovarian cancer ([place])    2015-TAH/BSO  . Prolapse of vaginal vault after hysterectomy 07/20/2015  . Rectovaginal fistula 08/05/2015  . Sigmoid diverticulosis   . Smokers' cough [facility])     Past Surgical History:  Procedure Laterality Date  . BREAST ENHANCEMENT SURGERY  1986  . [place]  . COLONOSCOPY N/A 10/07/2015   Procedure: COLONOSCOPY;  Surgeon: Rogene [place], MD;  [facility];  Service: Endoscopy;  Laterality: N/A;  7:30  . EXPLORATORY LAPAROTOMY/ OMENTECTOMY/  BILATERAL SALPINGOOPHORECTOMY/  [facility] PLACEMENT  07-03-2014   [facility]  . KNEE ARTHROSCOPY Left 09-22-2004  . LAPAROSCOPY N/A 06/02/2014   Procedure: DIAGNOSTIC LAPAROSCOPY, OMENTAL BIOPSY, RIGHT OVARY BIOPSY, LYSIS OF ADHESIONS;  Surgeon: Fanny Skates, MD;  Location: [place];  Service: General;  Laterality: N/A;  . MUCOSAL ADVANCEMENT FLAP N/A 06/15/2016   Procedure: EXCISION RECTOVAGINAOL FISTULA WITH MUCOSAL ADVANCEMENT FLAP;  Surgeon: Leighton Ruff, MD;  Location: [place];  Service: General;  Laterality: N/A;  . PLACEMENT OF SETON N/A 09/09/2015   Procedure: PLACEMENT OF SETON;  Surgeon: Leighton Ruff, MD;  Location: [place];  Service: General;  Laterality: N/A;  . PORT-A-CATH REMOVAL Right 08/17/2014   Procedure: REMOVAL INTRAPERITONEAL CHEMO PORT;  Surgeon: Fanny Skates, MD;  Location: [place];  Service: General;  Laterality: Right;  . PORTACATH PLACEMENT Right 08/17/2014   Procedure:  PLACE NEW PORT A CATH;  Surgeon: Fanny Skates, MD;  Location: [place];  Service: General;  Laterality: Right;  . RECTAL BIOPSY N/A 09/09/2015   Procedure: BIOPSY OF RECTOVAGINAL MASS;  Surgeon: Leighton Ruff, MD;  Location: [place];  Service: General;  Laterality: N/A;  . TUBAL  LIGATION  YRS AGO  . VAGINAL HYSTERECTOMY  1981   fibroids    Family Psychiatric History: see below  Family History:  Family History  Problem Relation Age of Onset  . Bipolar disorder Mother   . Anxiety disorder Mother   . Dementia Mother   . Depression Mother   . Mental illness Mother   . Vision loss Mother   . Alzheimer's disease Mother   . Alcohol abuse Paternal Uncle   . Bipolar disorder Maternal Grandmother   . Dementia Maternal Grandmother   . Alzheimer's disease Maternal Grandmother   . Alcohol abuse Paternal Uncle   . Stroke Brother   . Deep vein thrombosis Son   . Heart disease Father   . Hyperlipidemia Father   . Hypertension Father   . Stroke Father   . Vision loss Father   . Atrial fibrillation Sister   . Heart disease Brother   . Heart disease Brother   .  ADD / ADHD Neg Hx   . Drug abuse Neg Hx   . OCD Neg Hx   . Paranoid behavior Neg Hx   . Schizophrenia Neg Hx   . Seizures Neg Hx   . Sexual abuse Neg Hx   . Physical abuse Neg Hx     Social History:  Social History   Socioeconomic History  . Marital status: Married    Spouse name: Coralyn Mark  . Number of children: 3  . Years of education: 63  . Highest education level: Not on file  Occupational History  . Occupation: retire    Comment: [employer]  Tobacco Use  . Smoking status: Current Every Day Smoker    Packs/day: 0.50    Years: 43.00    Pack years: 21.50    Types: Cigarettes  . Smokeless tobacco: Never Used  Substance and Sexual Activity  . Alcohol use: No  . Drug use: No  . Sexual activity: Not Currently    Birth control/protection: Surgical  Other Topics Concern  . Not on file  Social History Narrative   Lives at home with Coralyn Mark   Retired [employer] Strain:   . Difficulty of Paying Living Expenses:   Food Insecurity:   . Worried About Charity fundraiser in the Last Year:   . Arboriculturist in the Last Year:    Transportation Needs:   . Film/video editor (Medical):   Marland Kitchen Lack of Transportation (Non-Medical):   Physical Activity:   . Days of Exercise per Week:   . Minutes of Exercise per Session:   Stress:   . Feeling of Stress :   Social Connections:   . Frequency of Communication with Friends and Family:   . Frequency of Social Gatherings with Friends and Family:   . Attends Religious Services:   . Active Member of Clubs or Organizations:   . Attends Archivist Meetings:   Marland Kitchen Marital Status:     Allergies: No Known Allergies  Metabolic Disorder Labs: No results found for: HGBA1C, MPG No results found for: PROLACTIN Lab Results  Component Value Date   CHOL 154 08/29/2019   TRIG 63 08/29/2019   HDL 53 08/29/2019   CHOLHDL 2.9 08/29/2019   VLDL 28 01/10/2017   LDLCALC 86 08/29/2019   LDLCALC 153 (H) 05/21/2018     Therapeutic Level Labs: No results found for: LITHIUM No results found for: VALPROATE No components found for:  CBMZ  Current Medications: Current Outpatient Medications  Medication Sig Dispense Refill  . alclomethasone (ACLOVATE) 0.05 % cream APPLY TO AFFECTED AREA TWICE A DAY AS NEEDED    . b complex vitamins tablet Take 1 tablet by mouth daily.    . Coenzyme Q10 (CO Q 10) 10 MG CAPS Take by mouth daily.    . divalproex (DEPAKOTE ER) 500 MG 24 hr tablet Take 1 tablet (500 mg total) by mouth daily. 90 tablet 3  . escitalopram (LEXAPRO) 20 MG tablet Take 1 tablet (20 mg total) by mouth 2 (two) times daily. 180 tablet 2  . fluorometholone (FML) 0.1 % ophthalmic suspension INSTILL 1 DROP INTO EACH EYE TWICE DAILY FOR 14 DAYS    . HORSE CHESTNUT PO Take by mouth.    Marland Kitchen HYDROcodone-acetaminophen (NORCO) 5-325 MG tablet Take 1 tablet by mouth every 4 (four) hours as needed for moderate pain. 60 tablet 0  . ketoconazole (NIZORAL) 2 % cream  Apply 1 application topically as needed. prn     . KRILL OIL PO Take 350 mg by mouth daily.    . lansoprazole  (PREVACID) 15 MG capsule Take 15 mg by mouth as needed.     Marland Kitchen LORazepam (ATIVAN) 1 MG tablet Take 1 tablet (1 mg total) by mouth 3 (three) times daily as needed for anxiety. 90 tablet 5  . losartan (COZAAR) 50 MG tablet TAKE 1 TABLET BY MOUTH EVERY DAY 90 tablet 3  . meloxicam (MOBIC) 15 MG tablet TAKE 1 TABLET BY MOUTH EVERY DAY 30 tablet 2  . Multiple Vitamin (MULTIVITAMIN) tablet Take 1 tablet by mouth daily.    . rosuvastatin (CRESTOR) 20 MG tablet TAKE 1 TABLET BY MOUTH EVERY DAY 90 tablet 1  . valACYclovir (VALTREX) 1000 MG tablet Take 1 tablet (1,000 mg total) by mouth daily as needed. 5 tablet 11   No current facility-administered medications for this visit.     Musculoskeletal: Strength & Muscle Tone: within normal limits Gait & Station: normal Patient leans: N/A  Psychiatric Specialty Exam: Review of Systems  Musculoskeletal: Positive for back pain.  All other systems reviewed and are negative.   There were no vitals taken for this visit.There is no height or weight on file to calculate BMI.  General Appearance: NA  Eye Contact:  NA  Speech:  Clear and Coherent  Volume:  Normal  Mood:  Euthymic  Affect:  NA  Thought Process:  Goal Directed  Orientation:  Full (Time, Place, and Person)  Thought Content: WDL   Suicidal Thoughts:  No  Homicidal Thoughts:  No  Memory:  Immediate;   Good Recent;   Good Remote;   Fair  Judgement:  Good  Insight:  Good  Psychomotor Activity:  Normal  Concentration:  Concentration: Good and Attention Span: Good  Recall:  Good  Fund of Knowledge: Good  Language: Good  Akathisia:  No  Handed:  Right  AIMS (if indicated): not done  Assets:  Communication Skills Desire for Improvement Resilience Social Support Talents/Skills  ADL's:  Intact  Cognition: WNL  Sleep:  Good   Screenings: PHQ2-9     Office Visit from 05/14/2018 in [facility] Office Visit from 01/15/2018 in [facility]  from 01/08/2017 in [facility] Visit from 11/07/2016 in [facility]  PHQ-2 Total Score  1  1  0  0  PHQ-9 Total Score  4  --  --  --       Assessment and Plan: This patient is a 69 year old female with a history of depression and a remote history of alcohol abuse and anxiety.  She continues to do well on her current regimen.  She will continue Ativan 1 mg 3 times daily as needed for anxiety, Lexapro 20 mg twice daily for depression and Depakote ER 500 mg at bedtime for mood stabilization.  She will return to see me in 6 months   [name], MD 02/10/2020, 11:11 AM

## 2020-02-16 ENCOUNTER — Encounter: Payer: Self-pay | Admitting: Adult Health

## 2020-02-16 ENCOUNTER — Other Ambulatory Visit: Payer: Self-pay

## 2020-02-16 ENCOUNTER — Ambulatory Visit: Payer: Medicare Other | Admitting: Adult Health

## 2020-02-16 VITALS — BP 125/89 | HR 79 | Ht 66.5 in | Wt 148.0 lb

## 2020-02-16 DIAGNOSIS — B009 Herpesviral infection, unspecified: Secondary | ICD-10-CM

## 2020-02-16 MED ORDER — VALACYCLOVIR HCL 1 G PO TABS: 1000.0000 mg | ORAL_TABLET | Freq: Two times a day (BID) | ORAL | 1 refills | Status: DC

## 2020-02-16 NOTE — Progress Notes (Signed)
  Subjective:     Patient ID: Abigail Wagner, female   DOB: 1951-07-02, 69 y.o.   MRN: [id number]  HPI [name] is a 69 year old white female, married, sp hysterectomy in complaining of pain in vaginal area, ?herpes outbreask, has not had in years.   Review of Systems ?herpes has pain in vaginal area for about 2 weeks, and has used A&D ointment and neosporin Reviewed past medical,surgical, social and family history. Reviewed medications and allergies.     Objective:   Physical Exam BP 125/89 (BP Location: Left Arm, Patient Position: Sitting, Cuff Size: Normal)   Pulse 79   Ht 5' 6.5" (1.689 m)   Wt 148 lb (67.1 kg)   BMI 23.53 kg/m  Skin warm and dry.Pelvic: external genitalia is normal in appearance, has  2 cm black head right inner buttock, has various stages of healing vesicles inner labia, bilaterally and skin is thin, vagina: atrophic,urethra has no lesions or masses noted, cervix and uterus are absent, adnexa: no masses or tenderness noted. Bladder is non tender and no masses felt. Fall risk is low PHQ 2 score is 0.  Examination chaperoned by Dwyane Dee LPN    Assessment:     1. Herpes Will rx valtrex Meds ordered this encounter  Medications  . valACYclovir (VALTREX) 1000 MG tablet    Sig: Take 1 tablet (1,000 mg total) by mouth 2 (two) times daily.    Dispense:  20 tablet    Refill:  1    Order Specific Question:   Supervising Provider    Answer:   Florian Buff [[id number]]      Plan:     [place] [birth year], to make sure has cleared

## 2020-03-04 ENCOUNTER — Inpatient Hospital Stay (HOSPITAL_COMMUNITY): Payer: Medicare Other | Attending: Hematology

## 2020-03-04 ENCOUNTER — Other Ambulatory Visit (HOSPITAL_COMMUNITY): Payer: Medicare Other

## 2020-03-04 ENCOUNTER — Other Ambulatory Visit: Payer: Self-pay

## 2020-03-04 ENCOUNTER — Encounter (HOSPITAL_COMMUNITY): Payer: Self-pay

## 2020-03-04 VITALS — BP 149/69 | HR 85 | Temp 97.5°F

## 2020-03-04 DIAGNOSIS — D649 Anemia, unspecified: Secondary | ICD-10-CM | POA: Insufficient documentation

## 2020-03-04 DIAGNOSIS — I10 Essential (primary) hypertension: Secondary | ICD-10-CM | POA: Insufficient documentation

## 2020-03-04 DIAGNOSIS — Z8542 Personal history of malignant neoplasm of other parts of uterus: Secondary | ICD-10-CM | POA: Diagnosis not present

## 2020-03-04 DIAGNOSIS — F17210 Nicotine dependence, cigarettes, uncomplicated: Secondary | ICD-10-CM | POA: Insufficient documentation

## 2020-03-04 DIAGNOSIS — Z85048 Personal history of other malignant neoplasm of rectum, rectosigmoid junction, and anus: Secondary | ICD-10-CM | POA: Diagnosis not present

## 2020-03-04 DIAGNOSIS — Z79899 Other long term (current) drug therapy: Secondary | ICD-10-CM | POA: Diagnosis not present

## 2020-03-04 DIAGNOSIS — Z95828 Presence of other vascular implants and grafts: Secondary | ICD-10-CM

## 2020-03-04 DIAGNOSIS — Z452 Encounter for adjustment and management of vascular access device: Secondary | ICD-10-CM | POA: Insufficient documentation

## 2020-03-04 DIAGNOSIS — C569 Malignant neoplasm of unspecified ovary: Secondary | ICD-10-CM

## 2020-03-04 LAB — IRON AND TIBC
Iron: 65 ug/dL (ref 28–170)
Saturation Ratios: 18 % (ref 10.4–31.8)
TIBC: 365 ug/dL (ref 250–450)
UIBC: 300 ug/dL

## 2020-03-04 LAB — COMPREHENSIVE METABOLIC PANEL
ALT: 13 U/L (ref 0–44)
AST: 21 U/L (ref 15–41)
Albumin: 3.7 g/dL (ref 3.5–5.0)
Alkaline Phosphatase: 46 U/L (ref 38–126)
Anion gap: 10 (ref 5–15)
BUN: 20 mg/dL (ref 8–23)
CO2: 24 mmol/L (ref 22–32)
Calcium: 8.8 mg/dL — ABNORMAL LOW (ref 8.9–10.3)
Chloride: 105 mmol/L (ref 98–111)
Creatinine, Ser: 0.79 mg/dL (ref 0.44–1.00)
GFR calc Af Amer: >60 mL/min (ref >60)
GFR calc non Af Amer: >60 mL/min (ref >60)
Glucose, Bld: 87 mg/dL (ref 70–99)
Potassium: 4.1 mmol/L (ref 3.5–5.1)
Sodium: 139 mmol/L (ref 135–145)
Total Bilirubin: 0.2 mg/dL — ABNORMAL LOW (ref 0.3–1.2)
Total Protein: 6.7 g/dL (ref 6.5–8.1)

## 2020-03-04 LAB — CBC WITH DIFFERENTIAL/PLATELET
Abs Immature Granulocytes: 0.01 10*3/uL (ref 0.00–0.07)
Basophils Absolute: 0 10*3/uL (ref 0.0–0.1)
Basophils Relative: 1 %
Eosinophils Absolute: 0.1 10*3/uL (ref 0.0–0.5)
Eosinophils Relative: 2 %
HCT: 32.8 % — ABNORMAL LOW (ref 36.0–46.0)
Hemoglobin: 10.6 g/dL — ABNORMAL LOW (ref 12.0–15.0)
Immature Granulocytes: 0 %
Lymphocytes Relative: 25 %
Lymphs Abs: 1.2 10*3/uL (ref 0.7–4.0)
MCH: 31.5 pg (ref 26.0–34.0)
MCHC: 32.3 g/dL (ref 30.0–36.0)
MCV: 97.6 fL (ref 80.0–100.0)
Monocytes Absolute: 0.4 10*3/uL (ref 0.1–1.0)
Monocytes Relative: 9 %
Neutro Abs: 3 10*3/uL (ref 1.7–7.7)
Neutrophils Relative %: 63 %
Platelets: 203 10*3/uL (ref 150–400)
RBC: 3.36 MIL/uL — ABNORMAL LOW (ref 3.87–5.11)
RDW: 16.2 % — ABNORMAL HIGH (ref 11.5–15.5)
WBC: 4.8 10*3/uL (ref 4.0–10.5)
nRBC: 0 % (ref 0.0–0.2)

## 2020-03-04 LAB — FERRITIN: Ferritin: 20 ng/mL (ref 11–307)

## 2020-03-04 MED ORDER — HEPARIN SOD (PORK) LOCK FLUSH 100 UNIT/ML IV SOLN
500.0000 [IU] | Freq: Once | INTRAVENOUS | Status: AC
  Administered 2020-03-04: 500 [IU] via INTRAVENOUS

## 2020-03-04 MED ORDER — SODIUM CHLORIDE 0.9% FLUSH
10.0000 mL | INTRAVENOUS | Status: DC | PRN
  Administered 2020-03-04: 10 mL via INTRAVENOUS

## 2020-03-05 LAB — CA 125: Cancer Antigen (CA) 125: 8.5 U/mL (ref 0.0–38.1)

## 2020-03-05 LAB — CEA: CEA: 6.7 ng/mL — ABNORMAL HIGH (ref 0.0–4.7)

## 2020-03-08 DIAGNOSIS — Z85048 Personal history of other malignant neoplasm of rectum, rectosigmoid junction, and anus: Secondary | ICD-10-CM | POA: Diagnosis not present

## 2020-03-10 ENCOUNTER — Encounter: Payer: Self-pay | Admitting: Adult Health

## 2020-03-10 ENCOUNTER — Ambulatory Visit: Payer: Medicare Other | Admitting: Adult Health

## 2020-03-10 VITALS — BP 135/72 | HR 84 | Ht 65.0 in | Wt 146.0 lb

## 2020-03-10 DIAGNOSIS — N9089 Other specified noninflammatory disorders of vulva and perineum: Secondary | ICD-10-CM

## 2020-03-10 MED ORDER — ACYCLOVIR 5 % EX OINT: TOPICAL_OINTMENT | CUTANEOUS | 1 refills | Status: DC

## 2020-03-10 NOTE — Progress Notes (Signed)
  Subjective:     Patient ID: Abigail Wagner, female   DOB: November 03, 1950, 69 y.o.   MRN: [id number]  HPI [name] is a 69 year old white female,married, sp hysterectomy back in follow up of recent herpes outbreak, still has burning/pain but better. PCP is Dr Dennard Schaumann.    Review of Systems Feels better but still has burning in vulva area, using  Neosporin Finished valtrex Reviewed past medical,surgical, social and family history. Reviewed medications and allergies.     Objective:   Physical Exam BP 135/72 (BP Location: Left Arm, Patient Position: Sitting, Cuff Size: Normal)   Pulse 84   Ht 5\' 5"  (1.651 m)   Wt 146 lb (66.2 kg)   BMI 24.30 kg/m   Skin warm and dry.Pelvic: external genitalia:vesilces have resolved, labia still slightly red and swollen, tissues thin  Assessment:     1. Vulvar irritation Vesicles have cleared, but still red and slightly swollen, will try zovirax ointment Meds ordered this encounter  Medications  . acyclovir ointment (ZOVIRAX) 5 %    Sig: Use every 3-4 hours prn    Dispense:  15 g    Refill:  1    Order Specific Question:   Supervising Provider    Answer:   Florian Buff [[id number]]      Plan:     Follow up in 3 weeks

## 2020-03-11 ENCOUNTER — Inpatient Hospital Stay (HOSPITAL_BASED_OUTPATIENT_CLINIC_OR_DEPARTMENT_OTHER): Payer: Medicare Other | Admitting: Hematology

## 2020-03-11 ENCOUNTER — Other Ambulatory Visit: Payer: Self-pay

## 2020-03-11 VITALS — BP 118/47 | HR 77 | Temp 98.2°F | Resp 18 | Wt 145.2 lb

## 2020-03-11 DIAGNOSIS — D649 Anemia, unspecified: Secondary | ICD-10-CM

## 2020-03-11 DIAGNOSIS — C569 Malignant neoplasm of unspecified ovary: Secondary | ICD-10-CM

## 2020-03-11 DIAGNOSIS — Z452 Encounter for adjustment and management of vascular access device: Secondary | ICD-10-CM | POA: Diagnosis not present

## 2020-03-11 DIAGNOSIS — I10 Essential (primary) hypertension: Secondary | ICD-10-CM | POA: Diagnosis not present

## 2020-03-11 DIAGNOSIS — C210 Malignant neoplasm of anus, unspecified: Secondary | ICD-10-CM | POA: Diagnosis not present

## 2020-03-11 DIAGNOSIS — Z79899 Other long term (current) drug therapy: Secondary | ICD-10-CM | POA: Diagnosis not present

## 2020-03-11 DIAGNOSIS — Z85048 Personal history of other malignant neoplasm of rectum, rectosigmoid junction, and anus: Secondary | ICD-10-CM | POA: Diagnosis not present

## 2020-03-11 NOTE — Patient Instructions (Signed)
[place] at [facility] Discharge Instructions  You were seen today by Dr. Delton Coombes. He went over your recent results. Start taking your iron tablets and calcium daily. Dr. Delton Coombes will see you back in 3 months for labs and follow up.   Thank you for choosing [place] at [facility] to provide your oncology and hematology care.  To afford each patient quality time with our provider, please arrive at least 15 minutes before your scheduled appointment time.   If you have a lab appointment with the [facility] please come in thru the Main Entrance and check in at the main information desk  You need to re-schedule your appointment should you arrive 10 or more minutes late.  We strive to give you quality time with our providers, and arriving late affects you and other patients whose appointments are after yours.  Also, if you no show three or more times for appointments you may be dismissed from the clinic at the providers discretion.     Again, thank you for choosing [facility].  Our hope is that these requests will decrease the amount of time that you wait before being seen by our physicians.       _____________________________________________________________  Should you have questions after your visit to [facility], please contact our office at [phone] between the hours of 8:00 a.m. and 4:30 p.m.  Voicemails left after 4:00 p.m. will not be returned until the following business day.  For prescription refill requests, have your pharmacy contact our office and allow 72 hours.    Cancer Center Support Programs:   > Cancer Support Group  2nd Tuesday of the month 1pm-2pm, Journey Room

## 2020-03-11 NOTE — Progress Notes (Signed)
Abigail Wagner, [place] [address]   CLINIC:  Medical Oncology/Hematology  PCP:  Susy Frizzle, MD [address] [place] [address] [phone]   REASON FOR VISIT:  Follow-up for anal cancer and ovarian cancer  PRIOR THERAPY:  Anal cancer: radiation and chemo with 5-FU and mitomycin-C finished on 12/06/2015. Ovarian cancer: s/p debulking surgery, 6 cycles of carboplatin and paclitaxel competed on 11/2014.  CURRENT THERAPY: Surveillance  BRIEF ONCOLOGIC HISTORY:  Oncology History  History of ovarian cancer  06/02/2014 Procedure   Diagnostic laparoscopy, lysis of adhesions, biopsy of omental nodules, biopsy right ovary by Dr Dalbert Batman   06/04/2014 Pathology Results   Diagnosis 1. Omentum, biopsy, Anterior peritoneal & omental nodule - SEROUS CARCINOMA WITH ASSOCIATED ABUNDANT PSAMMOMA BODIES AND DESMOPLASTIC STROMAL REACTION CONSISTENT WITH INVASIVE IMPLANTS. - SEE COMMENT. 2. Omentum, biopsy, Omental nodule - SEROUS CARCINOMA WITH ASSOCIATED ABUNDANT PSAMMOMA BODIES AND DESMOPLASTIC STROMAL REACTION CONSISTENT WITH INVASIVE IMPLANTS. - SEE COMMENT. 3. Ovary, biopsy/wedge resection, Right ovary - SMALL FRAGMENTS OF ATYPICAL PAPILLARY PROLIFERATION WITH PSAMMOMA BODIES CONSISTENT WITH AT LEAST SEROUS BORDERLINE TUMOR.   07/03/2014 Procedure   Exploratory laparotomy, omentectomy, Bilateral salpingo-oophorectomy, inptraperitoneal PORT placement by Dr. Alycia Rossetti       07/24/2014 - 12/07/2014 Chemotherapy   Carboplatin/Paclitaxel x 6 cycles    08/17/2014 Procedure   Insertion of 8 French power port clear view tunneled venous vascular access device next line use of fluoroscopy for guidance and positioning Use of ultrasound for venipuncture Removal of intraperitoneal chemotherapy port By Dr. Dalbert Batman   05/14/2017 Genetic Testing   Negative genetic testing on the [facility] panel.  The [facility] gene panel offered by [employer] includes sequencing and deletion/duplication testing of the following 28 genes: APC, ATM, BARD1, BMPR1A, BRCA1, BRCA2, BRIP1, CHD1, CDK4, CDKN2A, CHEK2, EPCAM (large rearrangement only), MLH1, MSH2, MSH6, MUTYH, NBN, PALB2, PMS2, PTEN, RAD51C, RAD51D, SMAD4, STK11, and TP53. Sequencing was performed for select regions of POLE and POLD1, and large rearrangement analysis was performed for select regions of GREM1. The report date is April 27, 2017.  HRD testing was performed on the ovarian tumor.  The tumor was negative for any deleterious BRCA1 or BRCA2 mutations.    Genomic instability status was not interpretable.  Therefore, [facility] was unable to analyze the ovarian tumor for genomic instability.  The report date is May 14, 2017.    Anal cancer ([place])  09/09/2015 Procedure   Rectovaginal septal mass biopsy by Dr. Marcello Moores   09/10/2015 Pathology Results   Soft tissue mass, biopsy, rectovaginal septal mass - SQUAMOUS CELL CARCINOMA.   10/15/2015 PET scan   1. Focal hypermetabolism with associated ill-defined soft tissue fullness at the junction of the anterior anal wall and posterior inferior vaginal wall, in keeping with the provided history of a primary anal carcinoma. 2. No hypermetabolic locoregional or distant metastatic disease. 3. Status post hysterectomy, with no abnormal findings at the vaginal cuff. No evidence of hypermetabolic peritoneal tumor. 4. Tiny nonobstructing bilateral renal stones.   10/25/2015 - 12/06/2015 Radiation Therapy   [place], [place]   10/25/2015 - 12/06/2015 Chemotherapy   Mitomycin C and 5FU, by Dr. Jacquiline Doe    06/15/2016 Procedure   EXCISION RECTOVAGINAOL FISTULA WITH MUCOSAL ADVANCEMENT FLAP by Dr. Marcello Moores   06/18/2016 Pathology Results   Fistula, rectovaginal SQUAMOUS, COLUMNAR JUNCTION MUCOSA AND SUBCUTANOUS SOFT TISSUE WITH FISTULA NO EVIDENCE OF MALIGNANCY   09/20/2016 Imaging   CT CAP- 1. No  acute findings and no evidence for  recurrent tumor or metastatic disease. 2. Aortic atherosclerosis and coronary artery calcification     CANCER STAGING: Cancer Staging Anal cancer ([place]) Staging form: Anus, AJCC 8th Edition - Clinical: Stage IIA (cT2, cN0, cM0) - Signed by Baird [facility], PA-C on 09/27/2016   INTERVAL HISTORY:  Abigail Wagner, a 69 y.o. female, returns for routine follow-up of her anal cancer and ovarian cancer. Abigail Wagner was last seen on 03/10/2019.   Today she reports feeling well. She denies any abnormal bleeding, melena or any F/C/night sweats. She denies having abdominal pain. She has not been taking any iron tablets for a year now; she tolerated them well without constipation when she was taking them.  She saw Dr. Gwendolyn Lima on 03/08/2020 for her anal cancer and reportedly everything was well.   REVIEW OF SYSTEMS:  Review of Systems  Constitutional: Positive for appetite change (moderately decreased) and fatigue (severe).  Respiratory: Positive for cough and shortness of breath.   Gastrointestinal: Negative for abdominal pain and blood in stool.  Neurological: Positive for dizziness.  All other systems reviewed and are negative.   PAST MEDICAL/SURGICAL HISTORY:  Past Medical History:  Diagnosis Date  . Allergy   . Anal carcinoma [facility]) oncologist-  dr Whitney Muse ([facility])   dx 12/ 2016 [facility] --  chemo and radiation- Dr. Marcello Moores  . Anemia    due to her cancer  . Anxiety    Dr. Harrington Challenger at [place] (psychiatrist).    . Carcinoma of ovary, stage 3 [facility]) oncologist-  dr gehrig/  dr Doreene Eland (cancer center in [place])--  no recurrency   dx 09/ 2015  Stage IIIB  papillary ovarian carcinoma  s/p  omentectomy and BSO &  chemotherapy (completed 12-07-2014)  . Chemotherapy induced nausea and vomiting 10/23/2014  . Chronic kidney disease    stones  . DDD (degenerative disc disease), lumbosacral   . GERD (gastroesophageal reflux disease)   . Headache(784.0)   . History of acute  respiratory failure    05-30-2009  drug overdose-- (intubated for 2 days)  and aspiration pneumonia  . History of adenomatous polyp of colon 10/07/2015   tubular adenoma high grade dysplasia  . History of herpes genitalis   . History of kidney stones   . History of ovarian cancer 08/03/2015  . History of suicide attempt    per documentation in epic  05-30-2009  overdose benaodiazepine  . HTN (hypertension)    takes Metoprolol daily  . Hyperlipidemia   . Hypertension 09/27/2016  . IBS (irritable bowel syndrome)    takes Bentyl daily  . Internal carotid artery stenosis, left    <50%, ulcerative plaque at carotid bulb.   . Major depression   . Mood disorder ([place])   . OA (osteoarthritis)    both hips  . Ovarian cancer ([place])    2015-TAH/BSO  . Prolapse of vaginal vault after hysterectomy 07/20/2015  . Rectovaginal fistula 08/05/2015  . Sigmoid diverticulosis   . Smokers' cough [facility])    Past Surgical History:  Procedure Laterality Date  . BREAST ENHANCEMENT SURGERY  1986  . [place]  . COLONOSCOPY N/A 10/07/2015   Procedure: COLONOSCOPY;  Surgeon: Rogene Houston, MD;  [facility];  Service: Endoscopy;  Laterality: N/A;  7:30  . EXPLORATORY LAPAROTOMY/ OMENTECTOMY/  BILATERAL SALPINGOOPHORECTOMY/  [facility] PLACEMENT  07-03-2014   [facility]  . KNEE ARTHROSCOPY Left 09-22-2004  . LAPAROSCOPY N/A  06/02/2014   Procedure: DIAGNOSTIC LAPAROSCOPY, OMENTAL BIOPSY, RIGHT OVARY BIOPSY, LYSIS OF ADHESIONS;  Surgeon: Fanny Skates, MD;  Location: [facility];  Service: General;  Laterality: N/A;  . MUCOSAL ADVANCEMENT FLAP N/A 06/15/2016   Procedure: EXCISION RECTOVAGINAOL FISTULA WITH MUCOSAL ADVANCEMENT FLAP;  Surgeon: Leighton Ruff, MD;  Location: [facility];  Service: General;  Laterality: N/A;  . PLACEMENT OF SETON N/A 09/09/2015   Procedure: PLACEMENT OF SETON;  Surgeon: Leighton Ruff, MD;  Location: [place];  Service: General;   Laterality: N/A;  . PORT-A-CATH REMOVAL Right 08/17/2014   Procedure: REMOVAL INTRAPERITONEAL CHEMO PORT;  Surgeon: Fanny Skates, MD;  Location: [place];  Service: General;  Laterality: Right;  . PORTACATH PLACEMENT Right 08/17/2014   Procedure:  PLACE NEW PORT A CATH;  Surgeon: Fanny Skates, MD;  Location: [place];  Service: General;  Laterality: Right;  . RECTAL BIOPSY N/A 09/09/2015   Procedure: BIOPSY OF RECTOVAGINAL MASS;  Surgeon: Leighton Ruff, MD;  Location: [place];  Service: General;  Laterality: N/A;  . TUBAL LIGATION  YRS AGO  . VAGINAL HYSTERECTOMY  1981   fibroids    SOCIAL HISTORY:  Social History   Socioeconomic History  . Marital status: Married    Spouse name: Coralyn Mark  . Number of children: 3  . Years of education: 110  . Highest education level: Not on file  Occupational History  . Occupation: retire    Comment: [employer]  Tobacco Use  . Smoking status: Current Every Day Smoker    Packs/day: 0.50    Years: 43.00    Pack years: 21.50    Types: Cigarettes  . Smokeless tobacco: Never Used  Vaping Use  . Vaping Use: Never used  Substance and Sexual Activity  . Alcohol use: No  . Drug use: No  . Sexual activity: Not Currently    Birth control/protection: Surgical    Comment: hyst  Other Topics Concern  . Not on file  Social History Narrative   Lives at home with Coralyn Mark   Retired [place] Strain:   . Difficulty of Paying Living Expenses:   Food Insecurity:   . Worried About Charity fundraiser in the Last Year:   . Arboriculturist in the Last Year:   Transportation Needs:   . Film/video editor (Medical):   Marland Kitchen Lack of Transportation (Non-Medical):   Physical Activity:   . Days of Exercise per Week:   . Minutes of Exercise per Session:   Stress:   . Feeling of Stress :   Social Connections:   . Frequency of Communication with  Friends and Family:   . Frequency of Social Gatherings with Friends and Family:   . Attends Religious Services:   . Active Member of Clubs or Organizations:   . Attends Archivist Meetings:   Marland Kitchen Marital Status:   Intimate Partner Violence:   . Fear of Current or Ex-Partner:   . Emotionally Abused:   Marland Kitchen Physically Abused:   . Sexually Abused:     FAMILY HISTORY:  Family History  Problem Relation Age of Onset  . Bipolar disorder Mother   . Anxiety disorder Mother   . Dementia Mother   . Depression Mother   . Mental illness Mother   . Vision loss Mother   . Alzheimer's disease Mother   . Alcohol abuse Paternal Uncle   .  Bipolar disorder Maternal Grandmother   . Dementia Maternal Grandmother   . Alzheimer's disease Maternal Grandmother   . Alcohol abuse Paternal Uncle   . Stroke Brother   . Deep vein thrombosis Son   . Heart disease Father   . Hyperlipidemia Father   . Hypertension Father   . Stroke Father   . Vision loss Father   . Atrial fibrillation Sister   . Heart disease Brother   . Heart disease Brother   . ADD / ADHD Neg Hx   . Drug abuse Neg Hx   . OCD Neg Hx   . Paranoid behavior Neg Hx   . Schizophrenia Neg Hx   . Seizures Neg Hx   . Sexual abuse Neg Hx   . Physical abuse Neg Hx     CURRENT MEDICATIONS:  Current Outpatient Medications  Medication Sig Dispense Refill  . acyclovir ointment (ZOVIRAX) 5 % Use every 3-4 hours prn 15 g 1  . b complex vitamins tablet Take 1 tablet by mouth daily.    . Coenzyme Q10 (CO Q 10) 10 MG CAPS Take by mouth daily.    . divalproex (DEPAKOTE ER) 500 MG 24 hr tablet Take 1 tablet (500 mg total) by mouth daily. 90 tablet 3  . escitalopram (LEXAPRO) 20 MG tablet Take 1 tablet (20 mg total) by mouth 2 (two) times daily. 180 tablet 2  . KRILL OIL PO Take 350 mg by mouth daily.    Marland Kitchen losartan (COZAAR) 50 MG tablet TAKE 1 TABLET BY MOUTH EVERY DAY 90 tablet 3  . meloxicam (MOBIC) 15 MG tablet TAKE 1 TABLET BY MOUTH  EVERY DAY 30 tablet 2  . Multiple Vitamin (MULTIVITAMIN) tablet Take 1 tablet by mouth daily.    . rosuvastatin (CRESTOR) 20 MG tablet TAKE 1 TABLET BY MOUTH EVERY DAY 90 tablet 1  . valACYclovir (VALTREX) 1000 MG tablet Take 1 tablet (1,000 mg total) by mouth 2 (two) times daily. 20 tablet 1  . HYDROcodone-acetaminophen (NORCO) 5-325 MG tablet Take 1 tablet by mouth every 4 (four) hours as needed for moderate pain. (Patient not taking: Reported on 03/11/2020) 60 tablet 0  . LORazepam (ATIVAN) 1 MG tablet Take 1 tablet (1 mg total) by mouth 3 (three) times daily as needed for anxiety. (Patient not taking: Reported on 03/11/2020) 90 tablet 5   No current facility-administered medications for this visit.    ALLERGIES:  No Known Allergies  PHYSICAL EXAM:  Performance status (ECOG): 1 - Symptomatic but completely ambulatory  Vitals:   03/11/20 1451  BP: (!) 118/47  Pulse: 77  Resp: 18  Temp: 98.2 F (36.8 C)  SpO2: 100%   Wt Readings from Last 3 Encounters:  03/11/20 145 lb 3.2 oz (65.9 kg)  03/10/20 146 lb (66.2 kg)  02/16/20 148 lb (67.1 kg)   Physical Exam Vitals reviewed.  Constitutional:      Appearance: Normal appearance.  Cardiovascular:     Rate and Rhythm: Normal rate and regular rhythm.     Heart sounds: Normal heart sounds.  Pulmonary:     Effort: Pulmonary effort is normal.     Breath sounds: Normal breath sounds.  Abdominal:     General: There is no distension.     Palpations: Abdomen is soft. There is no mass.  Skin:    General: Skin is warm.  Neurological:     Mental Status: She is alert and oriented to person, place, and time.  Psychiatric:  Mood and Affect: Mood normal.     No inguinal adenopathy. LABORATORY DATA:  I have reviewed the labs as listed.  CBC Latest Ref Rng & Units 03/04/2020 09/01/2019 08/29/2019  WBC 4.0 - 10.5 K/uL 4.8 3.7(L) 3.6(L)  Hemoglobin 12.0 - 15.0 g/dL 10.6(L) 12.3 13.7  Hematocrit 36 - 46 % 32.8(L) 37.7 41.2  Platelets  150 - 400 K/uL 203 183 202   CMP Latest Ref Rng & Units 03/04/2020 09/01/2019 08/29/2019  Glucose 70 - 99 mg/dL 87 112(H) 92  BUN 8 - 23 mg/dL _0 Creatinine 0.44 - 1.00 mg/dL 0.79 0.66 0.76  Sodium 135 - 145 mmol/L 139 136 142  Potassium 3.5 - 5.1 mmol/L 4.1 4.2 4.7  Chloride 98 - 111 mmol/L 105 100 106  CO2 22 - 32 mmol/L _1 Calcium 8.9 - 10.3 mg/dL 8.8(L) 9.5 9.6  Total Protein 6.5 - 8.1 g/dL 6.7 6.5 6.5  Total Bilirubin 0.3 - 1.2 mg/dL 0.2(L) 0.5 0.3  Alkaline Phos 38 - 126 U/L 46 45 -  AST 15 - 41 U/L _2 ALT 0 - 44 U/L _3 DIAGNOSTIC IMAGING:  I have independently reviewed the scans and discussed with the patient. No results found.   ASSESSMENT:  1.  Stage II (T2 N0 M0) anal cancer: -Diagnosed in December 2016. -Chemoradiation therapy with 5-FU and mitomycin completed on 12/06/2015, treated at [facility]. -She is continuing follow-ups with Dr. Marcello Moores with anoscopy. -Recent annual exam in the last month was within normal limits.  2.  Stage III ovarian cancer: -Diagnosed in 2015, status post debulking surgery followed by 6 cycles of carboplatin and Taxol completed in March 2016.   PLAN:  1.  Stage II (T2 N0 M0) anal cancer: -She does not have any pain in the anal region.  No inguinal lymph nodes palpable. -We reviewed labs.  LFTs are normal. -Continue follow-up with Dr. Marcello Moores.  2.  Stage III ovarian cancer: -No abdominal bloating or pain. -CA-125 was normal.  3.  Normocytic anemia: -Hemoglobin dropped to 10.6 from 12.3 at previous visit 6 months ago. -Ferritin also dropped to 20 from 33 previously. -Denies any bleeding per rectum or melena. -We will check her stool for occult blood. -I have told her to start taking iron tablet daily.  I plan to see her back in 3 months with repeat labs including ferritin and iron panel.   Orders placed this encounter:  No orders of the defined types were placed in this encounter.    Derek Jack, MD [place] [phone]   I, Milinda Antis, am acting as a scribe for Dr. Sanda Linger.  I, Derek Jack MD, have reviewed the above documentation for accuracy and completeness, and I agree with the above.

## 2020-03-12 ENCOUNTER — Other Ambulatory Visit: Payer: Self-pay | Admitting: Family Medicine

## 2020-03-12 MED ORDER — HYDROCODONE-ACETAMINOPHEN 5-325 MG PO TABS: 1.0000 | ORAL_TABLET | ORAL | 0 refills | Status: DC | PRN

## 2020-03-12 NOTE — Telephone Encounter (Signed)
Last refilled: 02/09/2020 Last office visit: 08/08/2019

## 2020-03-19 ENCOUNTER — Other Ambulatory Visit: Payer: Self-pay | Admitting: Family Medicine

## 2020-03-22 ENCOUNTER — Other Ambulatory Visit (HOSPITAL_COMMUNITY): Payer: Self-pay | Admitting: *Deleted

## 2020-03-22 DIAGNOSIS — C210 Malignant neoplasm of anus, unspecified: Secondary | ICD-10-CM

## 2020-03-22 DIAGNOSIS — D649 Anemia, unspecified: Secondary | ICD-10-CM

## 2020-03-22 DIAGNOSIS — Z85048 Personal history of other malignant neoplasm of rectum, rectosigmoid junction, and anus: Secondary | ICD-10-CM | POA: Diagnosis not present

## 2020-03-22 DIAGNOSIS — Z452 Encounter for adjustment and management of vascular access device: Secondary | ICD-10-CM | POA: Diagnosis not present

## 2020-03-22 DIAGNOSIS — I10 Essential (primary) hypertension: Secondary | ICD-10-CM | POA: Diagnosis not present

## 2020-03-22 DIAGNOSIS — Z79899 Other long term (current) drug therapy: Secondary | ICD-10-CM | POA: Diagnosis not present

## 2020-03-22 LAB — OCCULT BLOOD X 1 CARD TO LAB, STOOL
Fecal Occult Bld: NEGATIVE
Fecal Occult Bld: NEGATIVE
Fecal Occult Bld: POSITIVE — AB

## 2020-04-08 ENCOUNTER — Ambulatory Visit (INDEPENDENT_AMBULATORY_CARE_PROVIDER_SITE_OTHER): Payer: Medicare Other | Admitting: Family Medicine

## 2020-04-08 ENCOUNTER — Other Ambulatory Visit: Payer: Self-pay

## 2020-04-08 VITALS — BP 140/56 | HR 84 | Temp 97.8°F | Ht 65.0 in | Wt 142.0 lb

## 2020-04-08 DIAGNOSIS — G8929 Other chronic pain: Secondary | ICD-10-CM

## 2020-04-08 DIAGNOSIS — I10 Essential (primary) hypertension: Secondary | ICD-10-CM

## 2020-04-08 DIAGNOSIS — M545 Low back pain, unspecified: Secondary | ICD-10-CM

## 2020-04-08 DIAGNOSIS — E785 Hyperlipidemia, unspecified: Secondary | ICD-10-CM

## 2020-04-08 DIAGNOSIS — M5136 Other intervertebral disc degeneration, lumbar region: Secondary | ICD-10-CM | POA: Diagnosis not present

## 2020-04-08 DIAGNOSIS — D649 Anemia, unspecified: Secondary | ICD-10-CM

## 2020-04-08 MED ORDER — OMEPRAZOLE 40 MG PO CPDR: 40.0000 mg | DELAYED_RELEASE_CAPSULE | Freq: Every day | ORAL | 3 refills | Status: DC

## 2020-04-08 NOTE — Progress Notes (Signed)
Subjective:    Patient ID: Abigail Wagner, female    DOB: October 27, 1950, 69 y.o.   MRN: [id number]  HPI  03/2019 Patient is a very pleasant 69 year old Caucasian female who has a very complicated past medical history who has a history of stage III ovarian cancer with metastasis to the omentum.  This was surgically resected with a bilateral salpingo-oophorectomy.  Patient had previously had a partial hysterectomy.  She is currently followed with periodic imaging as well as a Ca125 at the [facility].  She also has a history of stage II anal cancer.  She sees a Dr. Marcello Moores who performs periodic endoscopies usually every 6 months according to the patient.    She has been seeing her orthopedist for low back pain and has been receiving tramadol.  Unfortunately, the orthopedist says that there is nothing that they can do surgically to help her low back pain.  Physical therapy has not been beneficial.  Her most recent MRI from February is dictated below:  FINDINGS: Segmentation:  Normal  Alignment:  . mild retrolisthesis L1-2.  Mild anterolisthesis L3-4.  Vertebrae: Negative for fracture or mass. No significant bone marrow edema.  Conus medullaris and cauda equina: Conus extends to the T12-L1 level. Conus and cauda equina appear normal.  Paraspinal and other soft tissues: Negative for paraspinous mass or fluid collection.  Disc levels:  L1-2: Disc degeneration with disc bulging and mild spurring. No significant stenosis. Mild facet degeneration.  L2-3: Moderate disc degeneration with disc space narrowing and spurring. Right-sided disc protrusion and osteophyte is unchanged causing subarticular stenosis on the right and mild to moderate right foraminal encroachment. Bilateral facet hypertrophy. Spinal canal not stenotic.  L3-4: Disc degeneration. Small extruded disc fragment on the right extending cranially into the foramen is unchanged. There is subarticular and foraminal  stenosis on the right which is unchanged. Possible right L3 and L4 nerve root impingement. Moderate to advanced facet degeneration with mild spinal stenosis  L4-5: Disc degeneration and spurring on the left with left-sided facet hypertrophy causing subarticular and foraminal encroachment on the left. This is moderately severe and unchanged from the prior study  L5-S1: Disc and facet degeneration.  No significant spinal stenosis.   He has referred her back to me to help manage her chronic pain in her back.  Pain is located primarily in her lower back between L3 and L5.  It is made worse by prolonged standing and walking such as when she shops or buys groceries.  Is better after sitting and resting.  She has been using tramadol once a day or every other day when the pain is severe to help dull the pain.  However she is taking an extremely high dose of Lexapro 20 mg twice daily and therefore I am somewhat concerned about possible serotonin syndrome.  We discussed this and I would not like to continue tramadol long-term in this patient.  She has tolerated hydrocodone in the past without difficulty.  At that time, my plan was: I do not want to continue the tramadol due to the risk of serotonin syndrome.  No more often than the patient is taking the medication I would be comfortable giving her Norco 5/325 1 p.o. every 6 hours as needed pain.  She is taking 1 tablet every day to every other day.  Therefore 30 tablets should last her at least 1 month and possibly 2 months.  If she is using it this sparingly I would be willing  to continue this long-term.  Patient is comfortable with this plan.  08/08/19 Patient is requesting a referral to a pain clinic.  She states the pain in her back is intensifying.  She states that 1 pain pill a day is no longer managing the pain.  She states that if she has to do anything involved like shopping for groceries or cleaning her house or performing chores that involves her  standing or walking for prolonged periods of time, she will have intense pain in her lower back that radiates into both gluteal cheeks and into the lateral thighs bilaterally.  This keeps her from sleeping at night.  She occasionally has to take a second pain pill at night to help her sleep.  She then has significant pain the following day and she is unable to take another hydrocodone because she will run out early.  In other words 30 tablets or not lasting long enough.  She believes that she needs to go to a pain clinic in order to get more pain medication.  She denies any saddle anesthesia.  She denies any bowel or bladder incontinence.  She denies any leg weakness.  She does have radicular symptoms into both lateral thighs with prolonged standing suggesting lumbar radiculopathy however there is no evidence or sign of cauda equina syndrome.  At that time, my plan was: I am willing to give the patient hydrocodone/acetaminophen 5/325 1 tablet every 4 hours as needed for pain.  We discussed and I will give her 60 tablets a month.  This would be sufficient to allow her to take 1 tablet in the morning and 1 tablet at night.  She believes that this would allow her to perform her chores around her home and also shop for groceries etc. without significant pain and decreased quality of life.  We discussed the risk of habituation and dependency.  I would not recommend escalating the dose further.  Patient has already tried cortisone injections in the back on 2 separate occasions.  Both times they helped for approximately 1 week and then the pain returned.  Her surgeon did not recommend surgery for degenerative disc disease.  Therefore we are simply trying to help the patient manage.  She has an appointment in December to see her cancer specialist.  She just had a mammogram.  She is not due for a Pap smear as she has had a hysterectomy.  Her immunizations are up-to-date.  She is due for fasting lab work to monitor her  cholesterol.  I recommended that she return at her earliest convenience for a CMP and a fasting lipid panel.  04/08/20 Patient is here today for her regular checkup.  However she recently saw her oncologist and her lab work her hemoglobin was found to have dropped from 12 to approximately 10.  She does report dark stools however she is on iron.  She denies any indigestion.  She denies any abdominal pain.  She denies any chest pain or shortness of breath.  She does report occasional dizziness and lightheadedness.  Of note the patient is taking meloxicam daily for degenerative disc disease.  She has not tried stopping the medication.  She denies any heartburn or abdominal pain that would suggest an ulcer however in her lab work, her oncologist did obtain 3 stool samples and 1 out of the 3 was positive for blood.  Patient states that their current plan is to recheck her in 2 months after taking the iron.  Her blood pressure  today is well controlled.  She is due check fasting lab work.  In June, her CMP was normal however she has not had her cholesterol checked.   Past Surgical History:  Procedure Laterality Date  . BREAST ENHANCEMENT SURGERY  1986  . [place]  . COLONOSCOPY N/A 10/07/2015   Procedure: COLONOSCOPY;  Surgeon: Rogene Houston, MD;  [facility];  Service: Endoscopy;  Laterality: N/A;  7:30  . EXPLORATORY LAPAROTOMY/ OMENTECTOMY/  BILATERAL SALPINGOOPHORECTOMY/  [facility] PLACEMENT  07-03-2014   [facility]  . KNEE ARTHROSCOPY Left 09-22-2004  . LAPAROSCOPY N/A 06/02/2014   Procedure: DIAGNOSTIC LAPAROSCOPY, OMENTAL BIOPSY, RIGHT OVARY BIOPSY, LYSIS OF ADHESIONS;  Surgeon: Fanny Skates, MD;  Location: [place];  Service: General;  Laterality: N/A;  . MUCOSAL ADVANCEMENT FLAP N/A 06/15/2016   Procedure: EXCISION RECTOVAGINAOL FISTULA WITH MUCOSAL ADVANCEMENT FLAP;  Surgeon: Leighton Ruff, MD;  Location: [place];  Service: General;  Laterality: N/A;    . PLACEMENT OF SETON N/A 09/09/2015   Procedure: PLACEMENT OF SETON;  Surgeon: Leighton Ruff, MD;  Location: [place];  Service: General;  Laterality: N/A;  . PORT-A-CATH REMOVAL Right 08/17/2014   Procedure: REMOVAL INTRAPERITONEAL CHEMO PORT;  Surgeon: Fanny Skates, MD;  Location: [place];  Service: General;  Laterality: Right;  . PORTACATH PLACEMENT Right 08/17/2014   Procedure:  PLACE NEW PORT A CATH;  Surgeon: Fanny Skates, MD;  Location: [place];  Service: General;  Laterality: Right;  . RECTAL BIOPSY N/A 09/09/2015   Procedure: BIOPSY OF RECTOVAGINAL MASS;  Surgeon: Leighton Ruff, MD;  Location: [place];  Service: General;  Laterality: N/A;  . TUBAL LIGATION  YRS AGO  . VAGINAL HYSTERECTOMY  1981   fibroids   Current Outpatient Medications on File Prior to Visit  Medication Sig Dispense Refill  . acyclovir ointment (ZOVIRAX) 5 % Use every 3-4 hours prn 15 g 1  . b complex vitamins tablet Take 1 tablet by mouth daily.    . Coenzyme Q10 (CO Q 10) 10 MG CAPS Take by mouth daily.    . divalproex (DEPAKOTE ER) 500 MG 24 hr tablet Take 1 tablet (500 mg total) by mouth daily. 90 tablet 3  . escitalopram (LEXAPRO) 20 MG tablet Take 1 tablet (20 mg total) by mouth 2 (two) times daily. 180 tablet 2  . HYDROcodone-acetaminophen (NORCO) 5-325 MG tablet Take 1 tablet by mouth every 4 (four) hours as needed for moderate pain. 60 tablet 0  . KRILL OIL PO Take 350 mg by mouth daily.    Marland Kitchen LORazepam (ATIVAN) 1 MG tablet Take 1 tablet (1 mg total) by mouth 3 (three) times daily as needed for anxiety. 90 tablet 5  . losartan (COZAAR) 50 MG tablet TAKE 1 TABLET BY MOUTH EVERY DAY 90 tablet 3  . meloxicam (MOBIC) 15 MG tablet TAKE 1 TABLET BY MOUTH EVERY DAY 30 tablet 0  . Multiple Vitamin (MULTIVITAMIN) tablet Take 1 tablet by mouth daily.    . rosuvastatin (CRESTOR) 20 MG tablet TAKE 1 TABLET BY MOUTH EVERY DAY 90 tablet 1    No current facility-administered medications on file prior to visit.   No Known Allergies Social History   Socioeconomic History  . Marital status: Married    Spouse name: Coralyn Mark  . Number of children: 3  . Years of education: 56  . Highest education level: Not on file  Occupational History  . Occupation: retire  Comment: [employer]  Tobacco Use  . Smoking status: Current Every Day Smoker    Packs/day: 0.50    Years: 43.00    Pack years: 21.50    Types: Cigarettes  . Smokeless tobacco: Never Used  Vaping Use  . Vaping Use: Never used  Substance and Sexual Activity  . Alcohol use: No  . Drug use: No  . Sexual activity: Not Currently    Birth control/protection: Surgical    Comment: hyst  Other Topics Concern  . Not on file  Social History Narrative   Lives at home with Coralyn Mark   Retired [place] Strain:   . Difficulty of Paying Living Expenses:   Food Insecurity:   . Worried About Charity fundraiser in the Last Year:   . Arboriculturist in the Last Year:   Transportation Needs:   . Film/video editor (Medical):   Marland Kitchen Lack of Transportation (Non-Medical):   Physical Activity:   . Days of Exercise per Week:   . Minutes of Exercise per Session:   Stress:   . Feeling of Stress :   Social Connections:   . Frequency of Communication with Friends and Family:   . Frequency of Social Gatherings with Friends and Family:   . Attends Religious Services:   . Active Member of Clubs or Organizations:   . Attends Archivist Meetings:   Marland Kitchen Marital Status:   Intimate Partner Violence:   . Fear of Current or Ex-Partner:   . Emotionally Abused:   Marland Kitchen Physically Abused:   . Sexually Abused:    Family History  Problem Relation Age of Onset  . Bipolar disorder Mother   . Anxiety disorder Mother   . Dementia Mother   . Depression Mother   . Mental illness Mother   . Vision loss Mother   . Alzheimer's  disease Mother   . Alcohol abuse Paternal Uncle   . Bipolar disorder Maternal Grandmother   . Dementia Maternal Grandmother   . Alzheimer's disease Maternal Grandmother   . Alcohol abuse Paternal Uncle   . Stroke Brother   . Deep vein thrombosis Son   . Heart disease Father   . Hyperlipidemia Father   . Hypertension Father   . Stroke Father   . Vision loss Father   . Atrial fibrillation Sister   . Heart disease Brother   . Heart disease Brother   . ADD / ADHD Neg Hx   . Drug abuse Neg Hx   . OCD Neg Hx   . Paranoid behavior Neg Hx   . Schizophrenia Neg Hx   . Seizures Neg Hx   . Sexual abuse Neg Hx   . Physical abuse Neg Hx       Review of Systems  All other systems reviewed and are negative.      Objective:   Physical Exam Vitals reviewed.  Constitutional:      General: She is not in acute distress.    Appearance: She is well-developed. She is not diaphoretic.  Neck:     Thyroid: No thyromegaly.     Vascular: No JVD.     Trachea: No tracheal deviation.  Cardiovascular:     Rate and Rhythm: Normal rate and regular rhythm.     Pulses:          Carotid pulses are on the left side with bruit.    Heart sounds: Normal  heart sounds. No murmur heard.  No friction rub. No gallop.   Pulmonary:     Effort: Pulmonary effort is normal. No respiratory distress.     Breath sounds: Normal breath sounds. No stridor. No wheezing or rales.  Musculoskeletal:     Lumbar back: Tenderness present. Decreased range of motion.  Neurological:     Mental Status: She is alert.     Motor: No abnormal muscle tone.           Assessment & Plan:  Benign essential HTN - Plan: CBC with Differential/Platelet, Lipid panel  DDD (degenerative disc disease), lumbar  Chronic midline low back pain without sciatica  Hyperlipidemia, unspecified hyperlipidemia type  Anemia, unspecified type  This was today mainly for her normal checkup.  Her blood pressure is adequately controlled and I  will check a fasting lipid panel to ensure adequate treatment of her cholesterol.  However I am concerned by her CBC and her decline in her hemoglobin as well as her stool test that was positive for blood in 1 out of 3 samples.  My concern is about her taking meloxicam daily.  I recommended that she discontinue the meloxicam in case she is developing gastritis due to NSAIDs which she has been taking for quite some time.  I would also like her to take omeprazole 40 mg a day empirically.  I will check a CBC today.  If her hemoglobin continues to decline, I would recommend GI consultation for endoscopy.  If her hemoglobin is stable I would like to recheck it in 1 month to see if it is improving on a combination of iron and discontinuation of meloxicam and initiation of proton pump inhibitor.  I will continue her chronic pain medication which she takes for degenerative disc disease.  Her anxiety medicine and depakote/lexapro comes from her psychiatrist

## 2020-04-09 LAB — LIPID PANEL
Cholesterol: 143 mg/dL (ref <200)
HDL: 57 mg/dL (ref >=50)
LDL Cholesterol (Calc): 69 mg/dL (calc)
Non-HDL Cholesterol (Calc): 86 mg/dL (calc) (ref <130)
Total CHOL/HDL Ratio: 2.5 (calc) (ref <5.0)
Triglycerides: 83 mg/dL (ref <150)

## 2020-04-09 LAB — CBC WITH DIFFERENTIAL/PLATELET
Absolute Monocytes: 423 cells/uL (ref 200–950)
Basophils Absolute: 28 cells/uL (ref 0–200)
Basophils Relative: 0.6 %
Eosinophils Absolute: 212 cells/uL (ref 15–500)
Eosinophils Relative: 4.5 %
HCT: 36.9 % (ref 35.0–45.0)
Hemoglobin: 11.9 g/dL (ref 11.7–15.5)
Lymphs Abs: 1105 cells/uL (ref 850–3900)
MCH: 31.1 pg (ref 27.0–33.0)
MCHC: 32.2 g/dL (ref 32.0–36.0)
MCV: 96.3 fL (ref 80.0–100.0)
MPV: 11 fL (ref 7.5–12.5)
Monocytes Relative: 9 %
Neutro Abs: 2933 cells/uL (ref 1500–7800)
Neutrophils Relative %: 62.4 %
Platelets: 220 10*3/uL (ref 140–400)
RBC: 3.83 10*6/uL (ref 3.80–5.10)
RDW: 15 % (ref 11.0–15.0)
Total Lymphocyte: 23.5 %
WBC: 4.7 10*3/uL (ref 3.8–10.8)

## 2020-04-12 ENCOUNTER — Other Ambulatory Visit: Payer: Self-pay | Admitting: *Deleted

## 2020-04-12 NOTE — Telephone Encounter (Signed)
Received call from patient.   Requested refill on Hydrocodone/APAP.   Ok to refill??  Last office visit 04/08/2020.  Last refill 03/12/2020.

## 2020-04-13 ENCOUNTER — Other Ambulatory Visit: Payer: Self-pay | Admitting: *Deleted

## 2020-04-13 DIAGNOSIS — D649 Anemia, unspecified: Secondary | ICD-10-CM

## 2020-04-13 DIAGNOSIS — I10 Essential (primary) hypertension: Secondary | ICD-10-CM

## 2020-04-13 MED ORDER — HYDROCODONE-ACETAMINOPHEN 5-325 MG PO TABS: 1.0000 | ORAL_TABLET | ORAL | 0 refills | Status: DC | PRN

## 2020-04-14 ENCOUNTER — Other Ambulatory Visit: Payer: Self-pay | Admitting: Family Medicine

## 2020-04-21 ENCOUNTER — Other Ambulatory Visit: Payer: Self-pay | Admitting: Family Medicine

## 2020-05-14 ENCOUNTER — Other Ambulatory Visit: Payer: Self-pay | Admitting: *Deleted

## 2020-05-14 MED ORDER — HYDROCODONE-ACETAMINOPHEN 5-325 MG PO TABS: 1.0000 | ORAL_TABLET | ORAL | 0 refills | Status: DC | PRN

## 2020-05-14 NOTE — Telephone Encounter (Signed)
Received call from patient.   Requested refill on Hydrocodone/APAP.   Ok to refill??  Last office visit 04/08/2020.  Last refill 04/13/2020.

## 2020-05-18 ENCOUNTER — Telehealth: Payer: Medicare Other | Admitting: Family

## 2020-05-18 DIAGNOSIS — Z20822 Contact with and (suspected) exposure to covid-19: Secondary | ICD-10-CM

## 2020-05-18 MED ORDER — BENZONATATE 100 MG PO CAPS
100.0000 mg | ORAL_CAPSULE | Freq: Three times a day (TID) | ORAL | 0 refills | Status: DC | PRN
Start: 2020-05-18 — End: 2020-10-06

## 2020-05-18 MED ORDER — ALBUTEROL SULFATE HFA 108 (90 BASE) MCG/ACT IN AERS
2.0000 | INHALATION_SPRAY | Freq: Four times a day (QID) | RESPIRATORY_TRACT | 0 refills | Status: DC | PRN
Start: 2020-05-18 — End: 2020-10-06

## 2020-05-18 NOTE — Progress Notes (Signed)
E-Visit for Corona Virus Screening  Your current symptoms could be consistent with the coronavirus.  Many health care providers can now test patients at their office but not all are.  [facility] has multiple testing sites. For information on our [place] testing locations and hours go to [email or web address]  We are enrolling you in our [place] for [place] . Daily you will receive a questionnaire within the [facility] website. Our COVID 19 response team will be monitoring your responses daily.  Testing Information: The COVID-19 Community Testing sites will begin testing BY APPOINTMENT ONLY.  You can schedule online at [email or web address]  If you do not have access to a smart phone or computer you may call [phone] for an appointment.   Additional testing sites in the Community:  . For [facility] Testing sites in [facility]  FaceUpdate.uy  . For Pop-up testing sites in [place]  BowlDirectory.co.uk  . For Testing sites with regular hours [email or web address]  . For [place] [email or web address]  . For [facility] [email or web address]  . For [facility] testing in [place] and [employer] [email or web address]  . For [facility] testing in [facility]   [email or web address]  For  more information about community testing call [phone]   Please quarantine yourself while awaiting your test results. Please stay home for a minimum of 10 days from the first day of illness with improving symptoms and you have had 24 hours of no fever (without the use of Tylenol (Acetaminophen)  Motrin (Ibuprofen) or any fever reducing medication).  Also - Do not get tested prior to returning to work because once you have had a positive test the test can stay positive for more then a month in some cases.   You should wear a mask or cloth face covering over your nose and mouth if you must be around other people or animals, including pets (even at home). Try to stay at least 6 feet away from other people. This will protect the people around you.  Please continue good preventive care measures, including:  frequent hand-washing, avoid touching your face, cover coughs/sneezes, stay out of crowds and keep a 6 foot distance from others.  COVID-19 is a respiratory illness with symptoms that are similar to the flu. Symptoms are typically mild to moderate, but there have been cases of severe illness and death due to the virus.   The following symptoms may appear 2-14 days after exposure: . Fever . Cough . Shortness of breath or difficulty breathing . Chills . Repeated shaking with chills . Muscle pain . Headache . Sore throat . New loss of taste or smell . Fatigue . Congestion or runny nose . Nausea or vomiting . Diarrhea  Go to the nearest hospital ED for assessment if fever/cough/breathlessness are severe or illness seems like a threat to life.  It is vitally important that if you feel that you have an infection such as this virus or any other virus that you stay home and away from places where you may spread it to others.  You should avoid contact with people age 16 and older.   You can use medication such as A prescription cough medication called Tessalon Perles 100 mg. You may take 1-2 capsules every 8 hours as needed for cough and A prescription inhaler called Albuterol MDI 90 mcg /actuation 2 puffs every 4 hours as needed for shortness of breath, wheezing, cough  You may also take acetaminophen (Tylenol) as needed for fever.  Reduce  your risk of any infection by using the same  precautions used for avoiding the common cold or flu:  Marland Kitchen Wash your hands often with soap and warm water for at least 20 seconds.  If soap and water are not readily available, use an alcohol-based hand sanitizer with at least 60% alcohol.  . If coughing or sneezing, cover your mouth and nose by coughing or sneezing into the elbow areas of your shirt or coat, into a tissue or into your sleeve (not your hands). . Avoid shaking hands with others and consider head nods or verbal greetings only. . Avoid touching your eyes, nose, or mouth with unwashed hands.  . Avoid close contact with people who are sick. . Avoid places or events with large numbers of people in one location, like concerts or sporting events. . Carefully consider travel plans you have or are making. . If you are planning any travel outside or inside the [place], visit the CDC's Travelers' Health webpage for the latest health notices. . If you have some symptoms but not all symptoms, continue to monitor at home and seek medical attention if your symptoms worsen. . If you are having a medical emergency, call 911.  HOME CARE . Only take medications as instructed by your medical team. . Drink plenty of fluids and get plenty of rest. . A steam or ultrasonic humidifier can help if you have congestion.   GET HELP RIGHT AWAY IF YOU HAVE EMERGENCY WARNING SIGNS** FOR COVID-19. If you or someone is showing any of these signs seek emergency medical care immediately. Call 911 or proceed to your closest emergency facility if: . You develop worsening high fever. . Trouble breathing . Bluish lips or face . Persistent pain or pressure in the chest . New confusion . Inability to wake or stay awake . You cough up blood. . Your symptoms become more severe  **This list is not all possible symptoms. Contact your medical provider for any symptoms that are sever or concerning to you.  MAKE SURE YOU   Understand these instructions.  Will watch your  condition.  Will get help right away if you are not doing well or get worse.  Your e-visit answers were reviewed by a board certified advanced clinical practitioner to complete your personal care plan.  Depending on the condition, your plan could have included both over the counter or prescription medications.  If there is a problem please reply once you have received a response from your provider.  Your safety is important to [place].  If you have drug allergies check your prescription carefully.    You can use MyChart to ask questions about today's visit, request a non-urgent call back, or ask for a work or school excuse for 24 hours related to this e-Visit. If it has been greater than 24 hours you will need to follow up with your provider, or enter a new e-Visit to address those concerns. You will get an e-mail in the next two days asking about your experience.  I hope that your e-visit has been valuable and will speed your recovery. Thank you for using e-visits.  Approximately 5 minutes was spent documenting and reviewing patient's chart.

## 2020-05-19 ENCOUNTER — Other Ambulatory Visit: Payer: Self-pay

## 2020-05-19 MED ORDER — ROSUVASTATIN CALCIUM 20 MG PO TABS: 20.0000 mg | ORAL_TABLET | Freq: Every day | ORAL | 1 refills | Status: DC

## 2020-05-25 ENCOUNTER — Emergency Department (HOSPITAL_COMMUNITY): Payer: Medicare Other

## 2020-05-25 ENCOUNTER — Ambulatory Visit: Payer: Medicare Other | Admitting: Family Medicine

## 2020-05-25 ENCOUNTER — Other Ambulatory Visit: Payer: Self-pay

## 2020-05-25 ENCOUNTER — Inpatient Hospital Stay (HOSPITAL_COMMUNITY)
Admission: EM | Admit: 2020-05-25 | Discharge: 2020-05-28 | DRG: 389 | Disposition: A | Discharge status: Home / Self Care | Payer: Medicare Other | Attending: Internal Medicine | Admitting: Internal Medicine

## 2020-05-25 DIAGNOSIS — K56609 Unspecified intestinal obstruction, unspecified as to partial versus complete obstruction: Secondary | ICD-10-CM | POA: Diagnosis not present

## 2020-05-25 DIAGNOSIS — K219 Gastro-esophageal reflux disease without esophagitis: Secondary | ICD-10-CM | POA: Diagnosis present

## 2020-05-25 DIAGNOSIS — Z79899 Other long term (current) drug therapy: Secondary | ICD-10-CM | POA: Diagnosis not present

## 2020-05-25 DIAGNOSIS — K627 Radiation proctitis: Secondary | ICD-10-CM | POA: Diagnosis not present

## 2020-05-25 DIAGNOSIS — F319 Bipolar disorder, unspecified: Secondary | ICD-10-CM | POA: Diagnosis present

## 2020-05-25 DIAGNOSIS — Z823 Family history of stroke: Secondary | ICD-10-CM | POA: Diagnosis not present

## 2020-05-25 DIAGNOSIS — Z923 Personal history of irradiation: Secondary | ICD-10-CM

## 2020-05-25 DIAGNOSIS — F419 Anxiety disorder, unspecified: Secondary | ICD-10-CM | POA: Diagnosis present

## 2020-05-25 DIAGNOSIS — Z9221 Personal history of antineoplastic chemotherapy: Secondary | ICD-10-CM | POA: Diagnosis not present

## 2020-05-25 DIAGNOSIS — E876 Hypokalemia: Secondary | ICD-10-CM | POA: Diagnosis present

## 2020-05-25 DIAGNOSIS — I10 Essential (primary) hypertension: Secondary | ICD-10-CM | POA: Diagnosis not present

## 2020-05-25 DIAGNOSIS — Z818 Family history of other mental and behavioral disorders: Secondary | ICD-10-CM | POA: Diagnosis not present

## 2020-05-25 DIAGNOSIS — Z820 Family history of epilepsy and other diseases of the nervous system: Secondary | ICD-10-CM | POA: Diagnosis not present

## 2020-05-25 DIAGNOSIS — Z20822 Contact with and (suspected) exposure to covid-19: Secondary | ICD-10-CM | POA: Diagnosis present

## 2020-05-25 DIAGNOSIS — C210 Malignant neoplasm of anus, unspecified: Secondary | ICD-10-CM | POA: Diagnosis not present

## 2020-05-25 DIAGNOSIS — Z821 Family history of blindness and visual loss: Secondary | ICD-10-CM | POA: Diagnosis not present

## 2020-05-25 DIAGNOSIS — I700 Atherosclerosis of aorta: Secondary | ICD-10-CM | POA: Diagnosis not present

## 2020-05-25 DIAGNOSIS — Z915 Personal history of self-harm: Secondary | ICD-10-CM

## 2020-05-25 DIAGNOSIS — R400 Somnolence: Secondary | ICD-10-CM | POA: Diagnosis not present

## 2020-05-25 DIAGNOSIS — K565 Intestinal adhesions [bands], unspecified as to partial versus complete obstruction: Secondary | ICD-10-CM | POA: Diagnosis not present

## 2020-05-25 DIAGNOSIS — E785 Hyperlipidemia, unspecified: Secondary | ICD-10-CM | POA: Diagnosis not present

## 2020-05-25 DIAGNOSIS — R410 Disorientation, unspecified: Secondary | ICD-10-CM | POA: Diagnosis not present

## 2020-05-25 DIAGNOSIS — M160 Bilateral primary osteoarthritis of hip: Secondary | ICD-10-CM | POA: Diagnosis not present

## 2020-05-25 DIAGNOSIS — Z8249 Family history of ischemic heart disease and other diseases of the circulatory system: Secondary | ICD-10-CM

## 2020-05-25 DIAGNOSIS — Z8543 Personal history of malignant neoplasm of ovary: Secondary | ICD-10-CM | POA: Diagnosis not present

## 2020-05-25 DIAGNOSIS — Z83438 Family history of other disorder of lipoprotein metabolism and other lipidemia: Secondary | ICD-10-CM | POA: Diagnosis not present

## 2020-05-25 DIAGNOSIS — F17200 Nicotine dependence, unspecified, uncomplicated: Secondary | ICD-10-CM | POA: Diagnosis not present

## 2020-05-25 DIAGNOSIS — F32A Depression, unspecified: Secondary | ICD-10-CM | POA: Diagnosis present

## 2020-05-25 DIAGNOSIS — R143 Flatulence: Secondary | ICD-10-CM | POA: Diagnosis present

## 2020-05-25 DIAGNOSIS — Z791 Long term (current) use of non-steroidal anti-inflammatories (NSAID): Secondary | ICD-10-CM | POA: Diagnosis not present

## 2020-05-25 DIAGNOSIS — N8110 Cystocele, unspecified: Secondary | ICD-10-CM | POA: Diagnosis not present

## 2020-05-25 DIAGNOSIS — K314 Gastric diverticulum: Secondary | ICD-10-CM | POA: Diagnosis not present

## 2020-05-25 LAB — URINALYSIS, ROUTINE W REFLEX MICROSCOPIC
Bilirubin Urine: NEGATIVE
Glucose, UA: NEGATIVE mg/dL
Hgb urine dipstick: NEGATIVE
Ketones, ur: 20 mg/dL — AB
Leukocytes,Ua: NEGATIVE
Nitrite: NEGATIVE
Protein, ur: NEGATIVE mg/dL
Specific Gravity, Urine: 1.019 (ref 1.005–1.030)
pH: 7 (ref 5.0–8.0)

## 2020-05-25 LAB — COMPREHENSIVE METABOLIC PANEL
ALT: 14 U/L (ref 0–44)
AST: 21 U/L (ref 15–41)
Albumin: 3.9 g/dL (ref 3.5–5.0)
Alkaline Phosphatase: 58 U/L (ref 38–126)
Anion gap: 13 (ref 5–15)
BUN: 16 mg/dL (ref 8–23)
CO2: 31 mmol/L (ref 22–32)
Calcium: 9.2 mg/dL (ref 8.9–10.3)
Chloride: 96 mmol/L — ABNORMAL LOW (ref 98–111)
Creatinine, Ser: 0.72 mg/dL (ref 0.44–1.00)
GFR calc Af Amer: >60 mL/min (ref >60)
GFR calc non Af Amer: >60 mL/min (ref >60)
Glucose, Bld: 127 mg/dL — ABNORMAL HIGH (ref 70–99)
Potassium: 3.4 mmol/L — ABNORMAL LOW (ref 3.5–5.1)
Sodium: 140 mmol/L (ref 135–145)
Total Bilirubin: 0.3 mg/dL (ref 0.3–1.2)
Total Protein: 7.2 g/dL (ref 6.5–8.1)

## 2020-05-25 LAB — CBC WITH DIFFERENTIAL/PLATELET
Abs Immature Granulocytes: 0.02 10*3/uL (ref 0.00–0.07)
Basophils Absolute: 0 10*3/uL (ref 0.0–0.1)
Basophils Relative: 0 %
Eosinophils Absolute: 0.1 10*3/uL (ref 0.0–0.5)
Eosinophils Relative: 1 %
HCT: 40.2 % (ref 36.0–46.0)
Hemoglobin: 12.9 g/dL (ref 12.0–15.0)
Immature Granulocytes: 0 %
Lymphocytes Relative: 15 %
Lymphs Abs: 0.8 10*3/uL (ref 0.7–4.0)
MCH: 31.2 pg (ref 26.0–34.0)
MCHC: 32.1 g/dL (ref 30.0–36.0)
MCV: 97.3 fL (ref 80.0–100.0)
Monocytes Absolute: 0.5 10*3/uL (ref 0.1–1.0)
Monocytes Relative: 11 %
Neutro Abs: 3.6 10*3/uL (ref 1.7–7.7)
Neutrophils Relative %: 73 %
Platelets: 223 10*3/uL (ref 150–400)
RBC: 4.13 MIL/uL (ref 3.87–5.11)
RDW: 14.7 % (ref 11.5–15.5)
WBC: 4.9 10*3/uL (ref 4.0–10.5)
nRBC: 0 % (ref 0.0–0.2)

## 2020-05-25 LAB — SARS CORONAVIRUS 2 BY RT PCR (HOSPITAL ORDER, PERFORMED IN ~~LOC~~ HOSPITAL LAB): SARS Coronavirus 2: NEGATIVE

## 2020-05-25 LAB — MAGNESIUM: Magnesium: 2.3 mg/dL (ref 1.7–2.4)

## 2020-05-25 MED ORDER — ONDANSETRON HCL 4 MG/2ML IJ SOLN: 4.0000 mg | Freq: Four times a day (QID) | INTRAMUSCULAR | Status: DC | PRN

## 2020-05-25 MED ORDER — HYDRALAZINE HCL 20 MG/ML IJ SOLN
10.0000 mg | INTRAMUSCULAR | Status: DC | PRN
  Administered 2020-05-26: 10 mg via INTRAVENOUS
  Filled 2020-05-25: qty 1

## 2020-05-25 MED ORDER — KCL IN DEXTROSE-NACL 40-5-0.9 MEQ/L-%-% IV SOLN
INTRAVENOUS | Status: DC
  Filled 2020-05-25 (×6): qty 1000

## 2020-05-25 MED ORDER — ONDANSETRON HCL 4 MG PO TABS: 4.0000 mg | ORAL_TABLET | Freq: Four times a day (QID) | ORAL | Status: DC | PRN

## 2020-05-25 MED ORDER — LORAZEPAM 2 MG/ML IJ SOLN
0.5000 mg | Freq: Once | INTRAMUSCULAR | Status: AC
  Administered 2020-05-25: 0.5 mg via INTRAVENOUS
  Filled 2020-05-25: qty 1

## 2020-05-25 MED ORDER — SODIUM CHLORIDE 0.9 % IV BOLUS
1000.0000 mL | Freq: Once | INTRAVENOUS | Status: AC
  Administered 2020-05-25: 1000 mL via INTRAVENOUS

## 2020-05-25 MED ORDER — HEPARIN SODIUM (PORCINE) 5000 UNIT/ML IJ SOLN
5000.0000 [IU] | Freq: Three times a day (TID) | INTRAMUSCULAR | Status: DC
  Administered 2020-05-25 – 2020-05-28 (×7): 5000 [IU] via SUBCUTANEOUS
  Filled 2020-05-25 (×7): qty 1

## 2020-05-25 MED ORDER — MORPHINE SULFATE (PF) 2 MG/ML IV SOLN
2.0000 mg | INTRAVENOUS | Status: DC | PRN
  Administered 2020-05-26: 2 mg via INTRAVENOUS
  Filled 2020-05-25: qty 1

## 2020-05-25 MED ORDER — IOHEXOL 300 MG/ML  SOLN
100.0000 mL | Freq: Once | INTRAMUSCULAR | Status: AC | PRN
  Administered 2020-05-25: 100 mL via INTRAVENOUS

## 2020-05-25 MED ORDER — LORAZEPAM 2 MG/ML IJ SOLN
0.5000 mg | Freq: Three times a day (TID) | INTRAMUSCULAR | Status: DC | PRN
  Administered 2020-05-25: 0.5 mg via INTRAVENOUS
  Filled 2020-05-25: qty 1

## 2020-05-25 NOTE — ED Triage Notes (Signed)
Pt reports burning indigestion in upper abd, n/v/d since Aug 20.  PT says has appt with pcp today but says was too uncomfortable to wait.  Also reports abd bloating.

## 2020-05-25 NOTE — ED Notes (Signed)
Nurse walked by and noted blood from pt arm. Pt had pulled out IV. Area cleaned and dressed

## 2020-05-25 NOTE — H&P (Signed)
History and Physical    Abigail Wagner ZOX:[id number] DOB: July 01, 1951 DOA: 05/25/2020  PCP: Susy Frizzle, MD   Patient coming from: Home  I have personally briefly reviewed patient's old medical records in [place]  Chief Complaint: Abdominal bloating, vomiting  HPI: Abigail Wagner is a 69 y.o. female with medical history significant for very large anal cancer, hypertension, bipolar disorder and depression. Patient presented to the ED with complaints of abdominal bloating over the past 2 weeks.  She had one episode of vomiting about 4 days ago.  Initially had loose stools, but over the past several days she has become constipated.  Her last bowel movement was yesterday, it was a small amount and it was hard.  Patient has gotten both doses of her Covid vaccine.  ED Course: Vitals stable.  Potassium 3.4 otherwise unremarkable CBC BMP.  UA not suggestive of, ketones present.  Contrast shows a high-grade small bowel obstruction with a transition point in the pelvis.  Moderate amount of free intraperitoneal fluid.  No mass at the site of transition. EDP talked to general surgeon Dr. Constance Haw, recommended admission, NG tube.  N.p.o. will see patient.  Review of Systems: As per HPI all other systems reviewed and negative.  Past Medical History:  Diagnosis Date  . Allergy   . Anal carcinoma [facility]) oncologist-  dr Whitney Muse ([facility])   dx 12/ 2016 [facility] --  chemo and radiation- Dr. Marcello Moores  . Anemia    due to her cancer  . Anxiety    Dr. Harrington Challenger at [place] (psychiatrist).    . Carcinoma of ovary, stage 3 [facility]) oncologist-  dr gehrig/  dr Doreene Eland (cancer center in [place]/ [facility])--  no recurrency   dx 09/ 2015  Stage IIIB  papillary ovarian carcinoma  s/p  omentectomy and BSO &  chemotherapy (completed 12-07-2014)  . Chemotherapy induced nausea and vomiting 10/23/2014  . Chronic kidney disease    stones  . DDD (degenerative disc disease), lumbosacral   . GERD (gastroesophageal  reflux disease)   . Headache(784.0)   . History of acute respiratory failure    05-30-2009  drug overdose-- (intubated for 2 days)  and aspiration pneumonia  . History of adenomatous polyp of colon 10/07/2015   tubular adenoma high grade dysplasia  . History of herpes genitalis   . History of kidney stones   . History of ovarian cancer 08/03/2015  . History of suicide attempt    per documentation in epic  05-30-2009  overdose benaodiazepine  . HTN (hypertension)    takes Metoprolol daily  . Hyperlipidemia   . Hypertension 09/27/2016  . IBS (irritable bowel syndrome)    takes Bentyl daily  . Internal carotid artery stenosis, left    <50%, ulcerative plaque at carotid bulb.   . Major depression   . Mood disorder ([place])   . OA (osteoarthritis)    both hips  . Ovarian cancer ([place])    2015-TAH/BSO  . Prolapse of vaginal vault after hysterectomy 07/20/2015  . Rectovaginal fistula 08/05/2015  . Sigmoid diverticulosis   . Smokers' cough [facility])     Past Surgical History:  Procedure Laterality Date  . BREAST ENHANCEMENT SURGERY  1986  . [place]  . COLONOSCOPY N/A 10/07/2015   Procedure: COLONOSCOPY;  Surgeon: Rogene Houston, MD;  [facility];  Service: Endoscopy;  Laterality: N/A;  7:30  . EXPLORATORY LAPAROTOMY/ OMENTECTOMY/  BILATERAL SALPINGOOPHORECTOMY/  PORT-A-CATH PLACEMENT  07-03-2014  [place]  . KNEE ARTHROSCOPY Left 09-22-2004  . LAPAROSCOPY N/A 06/02/2014   Procedure: DIAGNOSTIC LAPAROSCOPY, OMENTAL BIOPSY, RIGHT OVARY BIOPSY, LYSIS OF ADHESIONS;  Surgeon: Fanny Skates, MD;  Location: [place];  Service: General;  Laterality: N/A;  . MUCOSAL ADVANCEMENT FLAP N/A 06/15/2016   Procedure: EXCISION RECTOVAGINAOL FISTULA WITH MUCOSAL ADVANCEMENT FLAP;  Surgeon: Leighton Ruff, MD;  Location: [place];  Service: General;  Laterality: N/A;  . PLACEMENT OF SETON N/A 09/09/2015   Procedure: PLACEMENT OF SETON;  Surgeon: Leighton Ruff, MD;   Location: [place];  Service: General;  Laterality: N/A;  . PORT-A-CATH REMOVAL Right 08/17/2014   Procedure: REMOVAL INTRAPERITONEAL CHEMO PORT;  Surgeon: Fanny Skates, MD;  Location: [place];  Service: General;  Laterality: Right;  . PORTACATH PLACEMENT Right 08/17/2014   Procedure:  PLACE NEW PORT A CATH;  Surgeon: Fanny Skates, MD;  Location: [place];  Service: General;  Laterality: Right;  . RECTAL BIOPSY N/A 09/09/2015   Procedure: BIOPSY OF RECTOVAGINAL MASS;  Surgeon: Leighton Ruff, MD;  Location: [place];  Service: General;  Laterality: N/A;  . TUBAL LIGATION  YRS AGO  . VAGINAL HYSTERECTOMY  1981   fibroids     reports that she has been smoking cigarettes. She has a 21.50 pack-year smoking history. She has never used smokeless tobacco. She reports that she does not drink alcohol and does not use drugs.  No Known Allergies  Family History  Problem Relation Age of Onset  . Bipolar disorder Mother   . Anxiety disorder Mother   . Dementia Mother   . Depression Mother   . Mental illness Mother   . Vision loss Mother   . Alzheimer's disease Mother   . Alcohol abuse Paternal Uncle   . Bipolar disorder Maternal Grandmother   . Dementia Maternal Grandmother   . Alzheimer's disease Maternal Grandmother   . Alcohol abuse Paternal Uncle   . Stroke Brother   . Deep vein thrombosis Son   . Heart disease Father   . Hyperlipidemia Father   . Hypertension Father   . Stroke Father   . Vision loss Father   . Atrial fibrillation Sister   . Heart disease Brother   . Heart disease Brother   . ADD / ADHD Neg Hx   . Drug abuse Neg Hx   . OCD Neg Hx   . Paranoid behavior Neg Hx   . Schizophrenia Neg Hx   . Seizures Neg Hx   . Sexual abuse Neg Hx   . Physical abuse Neg Hx     Prior to Admission medications   Medication Sig Start Date End Date Taking? Authorizing Provider  acyclovir ointment (ZOVIRAX) 5 %  Use every 3-4 hours prn 03/10/20   Estill Dooms, NP  albuterol (VENTOLIN HFA) 108 (90 Base) MCG/ACT inhaler Inhale 2 puffs into the lungs every 6 (six) hours as needed for wheezing or shortness of breath. 05/18/20   Evelina Dun A, FNP  b complex vitamins tablet Take 1 tablet by mouth daily.    [provider]  benzonatate (TESSALON PERLES) 100 MG capsule Take 1 capsule (100 mg total) by mouth 3 (three) times daily as needed. 05/18/20   Evelina Dun A, FNP  Coenzyme Q10 (CO Q 10) 10 MG CAPS Take by mouth daily.    [provider]  divalproex (DEPAKOTE ER) 500 MG 24 hr tablet Take 1 tablet (500 mg total) by  mouth daily. 02/10/20   [name], MD  escitalopram (LEXAPRO) 20 MG tablet Take 1 tablet (20 mg total) by mouth 2 (two) times daily. 02/10/20   [name], MD  HYDROcodone-acetaminophen (NORCO) 5-325 MG tablet Take 1 tablet by mouth every 4 (four) hours as needed for moderate pain. 05/14/20   Susy Frizzle, MD  KRILL OIL PO Take 350 mg by mouth daily.    [provider]  LORazepam (ATIVAN) 1 MG tablet Take 1 tablet (1 mg total) by mouth 3 (three) times daily as needed for anxiety. 02/10/20   [name], MD  losartan (COZAAR) 50 MG tablet TAKE 1 TABLET BY MOUTH EVERY DAY 04/21/20   Susy Frizzle, MD  meloxicam (MOBIC) 15 MG tablet TAKE 1 TABLET BY MOUTH EVERY DAY 04/14/20   Susy Frizzle, MD  Multiple Vitamin (MULTIVITAMIN) tablet Take 1 tablet by mouth daily.    [provider]  omeprazole (PRILOSEC) 40 MG capsule Take 1 capsule (40 mg total) by mouth daily. 04/08/20   Susy Frizzle, MD  rosuvastatin (CRESTOR) 20 MG tablet Take 1 tablet (20 mg total) by mouth daily. 05/19/20   Susy Frizzle, MD    Physical Exam: Vitals:   05/25/20 0953 05/25/20 1230 05/25/20 1300 05/25/20 1515  BP:  99/88 127/73 (!) 165/85  Pulse:  72  74  Resp:      Temp:      TempSrc:      SpO2:  99%  98%  Weight: 62.1 kg     Height: 5\' 5"  (1.651  m)       Constitutional: NAD, calm, comfortable Vitals:   05/25/20 0953 05/25/20 1230 05/25/20 1300 05/25/20 1515  BP:  99/88 127/73 (!) 165/85  Pulse:  72  74  Resp:      Temp:      TempSrc:      SpO2:  99%  98%  Weight: 62.1 kg     Height: 5\' 5"  (1.651 m)      Eyes: PERRL, lids and conjunctivae normal ENMT: Mucous membranes are dry Neck: normal, supple, no masses, no thyromegaly Respiratory: clear to auscultation bilaterally, no wheezing, no crackles. Normal respiratory effort. No accessory muscle use.  Cardiovascular: Regular rate and rhythm, no murmurs / rubs / gallops. No extremity edema. 2+ pedal pulses.   Abdomen: Abdomen distended but not tense, minimal diffuse tenderness, no masses palpated. No hepatosplenomegaly  Musculoskeletal: no clubbing / cyanosis. No joint deformity upper and lower extremities. Good ROM, no contractures. Normal muscle tone.  Skin: no rashes, lesions, ulcers. No induration Neurologic: No apparent cranial nerve abnormality, moving extremities spontaneously. Psychiatric: Normal judgment and insight. Alert and oriented x 3. Normal mood.   Labs on Admission: I have personally reviewed following labs and imaging studies  CBC: Recent Labs  Lab 05/25/20 1111  WBC 4.9  NEUTROABS 3.6  HGB 12.9  HCT 40.2  MCV 97.3  PLT 409   Basic Metabolic Panel: Recent Labs  Lab 05/25/20 1111  NA 140  K 3.4*  CL 96*  CO2 31  GLUCOSE 127*  BUN 16  CREATININE 0.72  CALCIUM 9.2   Liver Function Tests: Recent Labs  Lab 05/25/20 1111  AST 21  ALT 14  ALKPHOS 58  BILITOT 0.3  PROT 7.2  ALBUMIN 3.9    Radiological Exams on Admission: CT ABDOMEN PELVIS W CONTRAST  Result Date: 05/25/2020 CLINICAL DATA:  Abdominal pain, history of ovarian and anal cancer. EXAM: CT  ABDOMEN AND PELVIS WITH CONTRAST TECHNIQUE: Multidetector CT imaging of the abdomen and pelvis was performed using the standard protocol following bolus administration of intravenous  contrast. CONTRAST:  120mL OMNIPAQUE IOHEXOL 300 MG/ML  SOLN COMPARISON:  CT abdomen pelvis dated 09/22/2015. FINDINGS: Lower chest: There is mild bibasilar atelectasis. Hepatobiliary: No focal liver abnormality is seen. No gallstones, gallbladder wall thickening, or biliary dilatation. Pancreas: Unremarkable. No pancreatic ductal dilatation or surrounding inflammatory changes. Spleen: Normal in size without focal abnormality. Adrenals/Urinary Tract: Adrenal glands are unremarkable. Kidneys are normal, without renal calculi, focal lesion, or hydronephrosis. Stomach/Bowel: A posterior gastric fundal diverticulum is redemonstrated. There are multiple loops of dilated small bowel with fecalization of small bowel contents and a transition point to decompressed distal small bowel in the pelvis (series 2, image 60 [id number]) most consistent with a high-grade small bowel obstruction. There is a moderate amount of free intraperitoneal fluid. There is no definite mass lesion at the site of transition to decompressed distal bowel. Vascular/Lymphatic: Aortic atherosclerosis. No enlarged abdominal or pelvic lymph nodes. Reproductive: Pelvic floor laxity and cystocele are redemonstrated. Status post hysterectomy. No adnexal masses. Other: No abdominal wall hernia. Musculoskeletal: Degenerative changes are seen in the spine. IMPRESSION: 1. Findings most consistent with a high-grade small bowel obstruction with a transition point in the pelvis. Moderate amount of free intraperitoneal fluid. No definite mass lesion at the site of transition to decompressed distal bowel. 2. Pelvic floor laxity and cystocele. Aortic Atherosclerosis (ICD10-I70.0). These results were called by telephone at the time of interpretation on 05/25/2020 at 2:19 pm to provider [facility] , who verbally acknowledged these results. Electronically Signed   By: Zerita Boers M.D.   On: 05/25/2020 14:20   EKG: Independently reviewed.  Sinus rhythm QTc 469.  No  significant change from prior.  Assessment/Plan Principal Problem:   SBO (small bowel obstruction) (HCC) Active Problems:   Depression   Bipolar 1 disorder (HCC)   History of ovarian cancer   Anal cancer ([place])   Small bowel obstruction-abdominal CT shows high-grade small bowel obstruction transition point in the pelvis.  No definite mass lesion seen.  Potassium 3.4, otherwise electrolytes unremarkable.  UA not suggestive of infection.  History of debulking abdominal surgery for ovarian cancer, and abdominal carcinomatosis. - Remain n.p.o. - 1 L bolus given, continue N/s + 40 kcl  75cc/hr  X 1 day - EDP talked to general surgeon, CA 125 and CA 19-9 ordered - insert NG tube -IV morphine 2 mg every 4 hourly as needed  History of stage III ovarian cancer and stage II anal cancer-follows with Dr. Delton Coombes.  Per notes 03/11/2020, she is status post chemoradiation for anal cancer 2017, follows with Dr. Marcello Moores for follow-up anoscopy. She is Status post debulking surgery and chemotherapy completed 2016 for ovarian cancer.  Hypertension-stable. -Hold home losartan while n.p.o. -As needed hydralazine.  Depression, bipolar disorder-she takes 1 mg Ativan 1-3 times a day. -Hold  Depakote, Lexapro, and as needed Po Ativan while n.p.o. -Continue Ativan 0.5 mg 3 times daily as needed.   DVT prophylaxis: Heparin Code Status: Full code. Family Communication: Spouse at bedside. Disposition Plan: ~ 2 days, pending resolution of small bowel obstruction Consults called: General surgery Admission status: Observation, MedSurg   Bethena Roys MD [facility]  05/25/2020, 5:35 PM

## 2020-05-25 NOTE — ED Provider Notes (Signed)
[facility] EMERGENCY DEPARTMENT Provider Note   CSN: [id number] Arrival date & time: 05/25/20  3532     History Chief Complaint  Patient presents with  . Abdominal Pain    Abigail Wagner is a 69 y.o. female.  The history is provided by the patient. No language interpreter was used.  Abdominal Pain Pain location:  Generalized Pain quality: aching   Pain radiates to:  Does not radiate Pain severity:  Severe Onset quality:  Gradual Duration:  1 week Timing:  Constant Progression:  Worsening Chronicity:  New Context: not sick contacts   Relieved by:  Nothing Worsened by:  Nothing Ineffective treatments:  None tried Associated symptoms: anorexia   Risk factors: multiple surgeries   Risk factors: no alcohol abuse    Pt reports worsening abdominal pain.  Pt has an appointment with Oncology for evaluation.  Pt reports pain is increasing.    Past Medical History:  Diagnosis Date  . Allergy   . Anal carcinoma [facility]) oncologist-  dr Whitney Muse ([facility])   dx 12/ 2016 [facility] --  chemo and radiation- Dr. Marcello Moores  . Anemia    due to her cancer  . Anxiety    Dr. Harrington Challenger at [place] (psychiatrist).    . Carcinoma of ovary, stage 3 [facility]) oncologist-  dr gehrig/  dr Doreene Eland (cancer center in [place] w/ [facility])--  no recurrency   dx 09/ 2015  Stage IIIB  papillary ovarian carcinoma  s/p  omentectomy and BSO &  chemotherapy (completed 12-07-2014)  . Chemotherapy induced nausea and vomiting 10/23/2014  . Chronic kidney disease    stones  . DDD (degenerative disc disease), lumbosacral   . GERD (gastroesophageal reflux disease)   . Headache(784.0)   . History of acute respiratory failure    05-30-2009  drug overdose-- (intubated for 2 days)  and aspiration pneumonia  . History of adenomatous polyp of colon 10/07/2015   tubular adenoma high grade dysplasia  . History of herpes genitalis   . History of kidney stones   . History of ovarian cancer 08/03/2015  . History of suicide  attempt    per documentation in epic  05-30-2009  overdose benaodiazepine  . HTN (hypertension)    takes Metoprolol daily  . Hyperlipidemia   . Hypertension 09/27/2016  . IBS (irritable bowel syndrome)    takes Bentyl daily  . Internal carotid artery stenosis, left    <50%, ulcerative plaque at carotid bulb.   . Major depression   . Mood disorder ([place])   . OA (osteoarthritis)    both hips  . Ovarian cancer ([place])    2015-TAH/BSO  . Prolapse of vaginal vault after hysterectomy 07/20/2015  . Rectovaginal fistula 08/05/2015  . Sigmoid diverticulosis   . Smokers' cough [facility])     Patient Active Problem List   Diagnosis Date Noted  . Vulvar irritation 03/10/2020  . Internal carotid artery stenosis, left   . Neural foraminal stenosis of lumbar spine 05/16/2018  . Genetic testing 05/24/2017  . HLD (hyperlipidemia) 02/07/2017  . Normocytic normochromic anemia 01/10/2017  . GERD (gastroesophageal reflux disease) 09/27/2016  . Back pain 09/27/2016  . Anxiety 09/27/2016  . Allergy 09/27/2016  . Radiation proctitis 12/23/2015  . Anal cancer ([place]) 11/19/2015  . History of ovarian cancer 08/03/2015  . Prolapse of vaginal vault after hysterectomy 07/20/2015  . Migraines 05/20/2015  . Port-A-Cath in place 01/13/2015  . Epithelial ovarian cancer, FIGO stage IIIA ([place]) 07/16/2014  . Abdominal carcinomatosis ([place]) 06/18/2014  .  Bipolar 1 disorder ([place]) 12/27/2011  . Kidney stones 12/27/2011  . IBS (irritable colon syndrome) 12/27/2011  . Depression 11/07/2011    Past Surgical History:  Procedure Laterality Date  . BREAST ENHANCEMENT SURGERY  1986  . [place]  . COLONOSCOPY N/A 10/07/2015   Procedure: COLONOSCOPY;  Surgeon: Rogene Houston, MD;  [facility];  Service: Endoscopy;  Laterality: N/A;  7:30  . EXPLORATORY LAPAROTOMY/ OMENTECTOMY/  BILATERAL SALPINGOOPHORECTOMY/  [facility] PLACEMENT  07-03-2014   [facility]  . KNEE ARTHROSCOPY Left 09-22-2004  .  LAPAROSCOPY N/A 06/02/2014   Procedure: DIAGNOSTIC LAPAROSCOPY, OMENTAL BIOPSY, RIGHT OVARY BIOPSY, LYSIS OF ADHESIONS;  Surgeon: Fanny Skates, MD;  Location: [place];  Service: General;  Laterality: N/A;  . MUCOSAL ADVANCEMENT FLAP N/A 06/15/2016   Procedure: EXCISION RECTOVAGINAOL FISTULA WITH MUCOSAL ADVANCEMENT FLAP;  Surgeon: Leighton Ruff, MD;  Location: [place];  Service: General;  Laterality: N/A;  . PLACEMENT OF SETON N/A 09/09/2015   Procedure: PLACEMENT OF SETON;  Surgeon: Leighton Ruff, MD;  Location: [place];  Service: General;  Laterality: N/A;  . PORT-A-CATH REMOVAL Right 08/17/2014   Procedure: REMOVAL INTRAPERITONEAL CHEMO PORT;  Surgeon: Fanny Skates, MD;  Location: [place];  Service: General;  Laterality: Right;  . PORTACATH PLACEMENT Right 08/17/2014   Procedure:  PLACE NEW PORT A CATH;  Surgeon: Fanny Skates, MD;  Location: [place];  Service: General;  Laterality: Right;  . RECTAL BIOPSY N/A 09/09/2015   Procedure: BIOPSY OF RECTOVAGINAL MASS;  Surgeon: Leighton Ruff, MD;  Location: [place];  Service: General;  Laterality: N/A;  . TUBAL LIGATION  YRS AGO  . VAGINAL HYSTERECTOMY  1981   fibroids     OB History    Gravida  3   Para  3   Term      Preterm      AB      Living  3     SAB      TAB      Ectopic      Multiple      Live Births  3           Family History  Problem Relation Age of Onset  . Bipolar disorder Mother   . Anxiety disorder Mother   . Dementia Mother   . Depression Mother   . Mental illness Mother   . Vision loss Mother   . Alzheimer's disease Mother   . Alcohol abuse Paternal Uncle   . Bipolar disorder Maternal Grandmother   . Dementia Maternal Grandmother   . Alzheimer's disease Maternal Grandmother   . Alcohol abuse Paternal Uncle   . Stroke Brother   . Deep vein thrombosis Son   . Heart disease Father   . Hyperlipidemia  Father   . Hypertension Father   . Stroke Father   . Vision loss Father   . Atrial fibrillation Sister   . Heart disease Brother   . Heart disease Brother   . ADD / ADHD Neg Hx   . Drug abuse Neg Hx   . OCD Neg Hx   . Paranoid behavior Neg Hx   . Schizophrenia Neg Hx   . Seizures Neg Hx   . Sexual abuse Neg Hx   . Physical abuse Neg Hx     Social History   Tobacco Use  . Smoking status: Current Every Day Smoker    Packs/day: 0.50  Years: 43.00    Pack years: 21.50    Types: Cigarettes  . Smokeless tobacco: Never Used  Vaping Use  . Vaping Use: Never used  Substance Use Topics  . Alcohol use: No  . Drug use: No    Home Medications Prior to Admission medications   Medication Sig Start Date End Date Taking? Authorizing Provider  acyclovir ointment (ZOVIRAX) 5 % Use every 3-4 hours prn 03/10/20   Estill Dooms, NP  albuterol (VENTOLIN HFA) 108 (90 Base) MCG/ACT inhaler Inhale 2 puffs into the lungs every 6 (six) hours as needed for wheezing or shortness of breath. 05/18/20   Evelina Dun A, FNP  b complex vitamins tablet Take 1 tablet by mouth daily.    [provider]  benzonatate (TESSALON PERLES) 100 MG capsule Take 1 capsule (100 mg total) by mouth 3 (three) times daily as needed. 05/18/20   Evelina Dun A, FNP  Coenzyme Q10 (CO Q 10) 10 MG CAPS Take by mouth daily.    [provider]  divalproex (DEPAKOTE ER) 500 MG 24 hr tablet Take 1 tablet (500 mg total) by mouth daily. 02/10/20   [name], MD  escitalopram (LEXAPRO) 20 MG tablet Take 1 tablet (20 mg total) by mouth 2 (two) times daily. 02/10/20   [name], MD  HYDROcodone-acetaminophen (NORCO) 5-325 MG tablet Take 1 tablet by mouth every 4 (four) hours as needed for moderate pain. 05/14/20   Susy Frizzle, MD  KRILL OIL PO Take 350 mg by mouth daily.    [provider]  LORazepam (ATIVAN) 1 MG tablet Take 1 tablet (1 mg total) by mouth 3 (three) times daily as needed  for anxiety. 02/10/20   [name], MD  losartan (COZAAR) 50 MG tablet TAKE 1 TABLET BY MOUTH EVERY DAY 04/21/20   Susy Frizzle, MD  meloxicam (MOBIC) 15 MG tablet TAKE 1 TABLET BY MOUTH EVERY DAY 04/14/20   Susy Frizzle, MD  Multiple Vitamin (MULTIVITAMIN) tablet Take 1 tablet by mouth daily.    [provider]  omeprazole (PRILOSEC) 40 MG capsule Take 1 capsule (40 mg total) by mouth daily. 04/08/20   Susy Frizzle, MD  rosuvastatin (CRESTOR) 20 MG tablet Take 1 tablet (20 mg total) by mouth daily. 05/19/20   Susy Frizzle, MD    Allergies    Patient has no known allergies.  Review of Systems   Review of Systems  Gastrointestinal: Positive for abdominal pain and anorexia.  All other systems reviewed and are negative.   Physical Exam Updated Vital Signs BP (!) 127/53 (BP Location: Right Arm)   Pulse 68   Temp 98.4 F (36.9 C) (Oral)   Resp 20   Ht 5\' 5"  (1.651 m)   Wt 62.1 kg   SpO2 96%   BMI 22.80 kg/m   Physical Exam Vitals and nursing note reviewed.  Constitutional:      Appearance: She is well-developed.  HENT:     Head: Normocephalic.  Cardiovascular:     Rate and Rhythm: Normal rate.  Pulmonary:     Effort: Pulmonary effort is normal.  Abdominal:     General: Bowel sounds are normal. There is no distension.     Palpations: Abdomen is soft.     Tenderness: There is generalized abdominal tenderness.  Musculoskeletal:        General: Normal range of motion.     Cervical back: Normal range of motion.  Neurological:  General: No focal deficit present.     Mental Status: She is alert and oriented to person, place, and time.  Psychiatric:        Mood and Affect: Mood normal.     ED Results / Procedures / Treatments   Labs (all labs ordered are listed, but only abnormal results are displayed) Labs Reviewed  COMPREHENSIVE METABOLIC PANEL - Abnormal; Notable for the following components:      Result Value   Potassium 3.4 (*)     Chloride 96 (*)    Glucose, Bld 127 (*)    All other components within normal limits  URINALYSIS, ROUTINE W REFLEX MICROSCOPIC - Abnormal; Notable for the following components:   APPearance HAZY (*)    Ketones, ur 20 (*)    All other components within normal limits  CBC WITH DIFFERENTIAL/PLATELET    EKG EKG Interpretation  Date/Time:  Tuesday May 25 2020 09:56:08 EDT Ventricular Rate:  68 PR Interval:  156 QRS Duration: 84 QT Interval:  442 QTC Calculation: 469 R Axis:   62 Text Interpretation: Normal sinus rhythm Nonspecific ST and T wave abnormality Abnormal ECG Confirmed by Veryl Speak [phone]) on 05/25/2020 11:17:04 AM   Radiology CT ABDOMEN PELVIS W CONTRAST  Result Date: 05/25/2020 CLINICAL DATA:  Abdominal pain, history of ovarian and anal cancer. EXAM: CT ABDOMEN AND PELVIS WITH CONTRAST TECHNIQUE: Multidetector CT imaging of the abdomen and pelvis was performed using the standard protocol following bolus administration of intravenous contrast. CONTRAST:  140mL OMNIPAQUE IOHEXOL 300 MG/ML  SOLN COMPARISON:  CT abdomen pelvis dated 09/22/2015. FINDINGS: Lower chest: There is mild bibasilar atelectasis. Hepatobiliary: No focal liver abnormality is seen. No gallstones, gallbladder wall thickening, or biliary dilatation. Pancreas: Unremarkable. No pancreatic ductal dilatation or surrounding inflammatory changes. Spleen: Normal in size without focal abnormality. Adrenals/Urinary Tract: Adrenal glands are unremarkable. Kidneys are normal, without renal calculi, focal lesion, or hydronephrosis. Stomach/Bowel: A posterior gastric fundal diverticulum is redemonstrated. There are multiple loops of dilated small bowel with fecalization of small bowel contents and a transition point to decompressed distal small bowel in the pelvis (series 2, image 60 2-66) most consistent with a high-grade small bowel obstruction. There is a moderate amount of free intraperitoneal fluid. There is no definite  mass lesion at the site of transition to decompressed distal bowel. Vascular/Lymphatic: Aortic atherosclerosis. No enlarged abdominal or pelvic lymph nodes. Reproductive: Pelvic floor laxity and cystocele are redemonstrated. Status post hysterectomy. No adnexal masses. Other: No abdominal wall hernia. Musculoskeletal: Degenerative changes are seen in the spine. IMPRESSION: 1. Findings most consistent with a high-grade small bowel obstruction with a transition point in the pelvis. Moderate amount of free intraperitoneal fluid. No definite mass lesion at the site of transition to decompressed distal bowel. 2. Pelvic floor laxity and cystocele. Aortic Atherosclerosis (ICD10-I70.0). These results were called by telephone at the time of interpretation on 05/25/2020 at 2:19 pm to provider [facility] , who verbally acknowledged these results. Electronically Signed   By: Zerita Boers M.D.   On: 05/25/2020 14:20    Procedures Procedures (including critical care time)  Medications Ordered in ED Medications  sodium chloride 0.9 % bolus 1,000 mL (1,000 mLs Intravenous New Bag/Given 05/25/20 1310)  iohexol (OMNIPAQUE) 300 MG/ML solution 100 mL (100 mLs Intravenous Contrast Given 05/25/20 1347)    ED Course  I have reviewed the triage vital signs and the nursing notes.  Pertinent labs & imaging results that were available during my care of the patient  were reviewed by me and considered in my medical decision making (see chart for details).    MDM Rules/Calculators/A&P                          MDM:  Ct scan shows High grade small bowel obstruction.   Consult Dr. Constance Haw General Surgery.  She advised Ng tube, Npo, Hospitalist admission  Ca 19-9 and Ca125 ordered.  Final Clinical Impression(s) / ED Diagnoses Final diagnoses:  Small bowel obstruction [facility])    Rx / DC Orders ED Discharge Orders    None       Sidney Ace 05/25/20 1514    Veryl Speak, MD 05/26/20 [phone]

## 2020-05-26 DIAGNOSIS — C210 Malignant neoplasm of anus, unspecified: Secondary | ICD-10-CM

## 2020-05-26 DIAGNOSIS — K56609 Unspecified intestinal obstruction, unspecified as to partial versus complete obstruction: Secondary | ICD-10-CM | POA: Diagnosis present

## 2020-05-26 DIAGNOSIS — K627 Radiation proctitis: Secondary | ICD-10-CM | POA: Diagnosis present

## 2020-05-26 DIAGNOSIS — I10 Essential (primary) hypertension: Secondary | ICD-10-CM

## 2020-05-26 DIAGNOSIS — F17200 Nicotine dependence, unspecified, uncomplicated: Secondary | ICD-10-CM | POA: Diagnosis present

## 2020-05-26 DIAGNOSIS — Z818 Family history of other mental and behavioral disorders: Secondary | ICD-10-CM | POA: Diagnosis not present

## 2020-05-26 DIAGNOSIS — Z79899 Other long term (current) drug therapy: Secondary | ICD-10-CM | POA: Diagnosis not present

## 2020-05-26 DIAGNOSIS — Z820 Family history of epilepsy and other diseases of the nervous system: Secondary | ICD-10-CM | POA: Diagnosis not present

## 2020-05-26 DIAGNOSIS — Z9221 Personal history of antineoplastic chemotherapy: Secondary | ICD-10-CM | POA: Diagnosis not present

## 2020-05-26 DIAGNOSIS — E876 Hypokalemia: Secondary | ICD-10-CM | POA: Diagnosis present

## 2020-05-26 DIAGNOSIS — E785 Hyperlipidemia, unspecified: Secondary | ICD-10-CM | POA: Diagnosis present

## 2020-05-26 DIAGNOSIS — Z823 Family history of stroke: Secondary | ICD-10-CM | POA: Diagnosis not present

## 2020-05-26 DIAGNOSIS — F319 Bipolar disorder, unspecified: Secondary | ICD-10-CM

## 2020-05-26 DIAGNOSIS — Z8249 Family history of ischemic heart disease and other diseases of the circulatory system: Secondary | ICD-10-CM | POA: Diagnosis not present

## 2020-05-26 DIAGNOSIS — K565 Intestinal adhesions [bands], unspecified as to partial versus complete obstruction: Secondary | ICD-10-CM | POA: Diagnosis present

## 2020-05-26 DIAGNOSIS — Z20822 Contact with and (suspected) exposure to covid-19: Secondary | ICD-10-CM | POA: Diagnosis present

## 2020-05-26 DIAGNOSIS — Z821 Family history of blindness and visual loss: Secondary | ICD-10-CM | POA: Diagnosis not present

## 2020-05-26 DIAGNOSIS — Z8543 Personal history of malignant neoplasm of ovary: Secondary | ICD-10-CM | POA: Diagnosis not present

## 2020-05-26 DIAGNOSIS — Z83438 Family history of other disorder of lipoprotein metabolism and other lipidemia: Secondary | ICD-10-CM | POA: Diagnosis not present

## 2020-05-26 DIAGNOSIS — Z915 Personal history of self-harm: Secondary | ICD-10-CM | POA: Diagnosis not present

## 2020-05-26 DIAGNOSIS — M160 Bilateral primary osteoarthritis of hip: Secondary | ICD-10-CM | POA: Diagnosis present

## 2020-05-26 DIAGNOSIS — Z791 Long term (current) use of non-steroidal anti-inflammatories (NSAID): Secondary | ICD-10-CM | POA: Diagnosis not present

## 2020-05-26 DIAGNOSIS — F419 Anxiety disorder, unspecified: Secondary | ICD-10-CM | POA: Diagnosis present

## 2020-05-26 DIAGNOSIS — K219 Gastro-esophageal reflux disease without esophagitis: Secondary | ICD-10-CM | POA: Diagnosis present

## 2020-05-26 LAB — BASIC METABOLIC PANEL
Anion gap: 13 (ref 5–15)
BUN: 7 mg/dL — ABNORMAL LOW (ref 8–23)
CO2: 27 mmol/L (ref 22–32)
Calcium: 8.9 mg/dL (ref 8.9–10.3)
Chloride: 100 mmol/L (ref 98–111)
Creatinine, Ser: 0.54 mg/dL (ref 0.44–1.00)
GFR calc Af Amer: >60 mL/min (ref >60)
GFR calc non Af Amer: >60 mL/min (ref >60)
Glucose, Bld: 102 mg/dL — ABNORMAL HIGH (ref 70–99)
Potassium: 3.2 mmol/L — ABNORMAL LOW (ref 3.5–5.1)
Sodium: 140 mmol/L (ref 135–145)

## 2020-05-26 LAB — CA 125: Cancer Antigen (CA) 125: 18.8 U/mL (ref 0.0–38.1)

## 2020-05-26 LAB — CANCER ANTIGEN 19-9: CA 19-9: 9 U/mL (ref 0–35)

## 2020-05-26 LAB — HIV ANTIBODY (ROUTINE TESTING W REFLEX): HIV Screen 4th Generation wRfx: NONREACTIVE

## 2020-05-26 MED ORDER — POTASSIUM CHLORIDE 10 MEQ/100ML IV SOLN
INTRAVENOUS | Status: AC
  Administered 2020-05-26: 10 meq via INTRAVENOUS
  Filled 2020-05-26: qty 100

## 2020-05-26 MED ORDER — POTASSIUM CHLORIDE 10 MEQ/100ML IV SOLN
10.0000 meq | INTRAVENOUS | Status: AC
  Administered 2020-05-26: 10 meq via INTRAVENOUS
  Filled 2020-05-26: qty 100

## 2020-05-26 MED ORDER — LORAZEPAM 1 MG PO TABS
1.0000 mg | ORAL_TABLET | Freq: Three times a day (TID) | ORAL | Status: DC | PRN
  Administered 2020-05-26 – 2020-05-28 (×4): 1 mg via ORAL
  Filled 2020-05-26 (×4): qty 1

## 2020-05-26 MED ORDER — CHLORHEXIDINE GLUCONATE CLOTH 2 % EX PADS
6.0000 | MEDICATED_PAD | Freq: Every day | CUTANEOUS | Status: DC
  Administered 2020-05-27 – 2020-05-28 (×2): 6 via TOPICAL

## 2020-05-26 MED ORDER — HALOPERIDOL LACTATE 5 MG/ML IJ SOLN
5.0000 mg | Freq: Once | INTRAMUSCULAR | Status: AC
  Administered 2020-05-26: 5 mg via INTRAVENOUS
  Filled 2020-05-26: qty 1

## 2020-05-26 MED ORDER — MAGNESIUM SULFATE 50 % IJ SOLN: 1.0000 g | Freq: Once | INTRAMUSCULAR | Status: DC

## 2020-05-26 MED ORDER — ESCITALOPRAM OXALATE 10 MG PO TABS
20.0000 mg | ORAL_TABLET | Freq: Two times a day (BID) | ORAL | Status: DC
  Administered 2020-05-26 – 2020-05-28 (×4): 20 mg via ORAL
  Filled 2020-05-26 (×4): qty 2

## 2020-05-26 MED ORDER — LORAZEPAM 2 MG/ML IJ SOLN
0.5000 mg | Freq: Once | INTRAMUSCULAR | Status: AC
  Administered 2020-05-26: 0.5 mg via INTRAVENOUS
  Filled 2020-05-26: qty 1

## 2020-05-26 MED ORDER — MAGNESIUM SULFATE IN D5W 1-5 GM/100ML-% IV SOLN
1.0000 g | Freq: Once | INTRAVENOUS | Status: AC
  Administered 2020-05-26: 1 g via INTRAVENOUS
  Filled 2020-05-26 (×2): qty 100

## 2020-05-26 MED ORDER — PANTOPRAZOLE SODIUM 40 MG PO TBEC
40.0000 mg | DELAYED_RELEASE_TABLET | Freq: Every day | ORAL | Status: DC
  Administered 2020-05-26 – 2020-05-28 (×3): 40 mg via ORAL
  Filled 2020-05-26 (×3): qty 1

## 2020-05-26 MED ORDER — DIVALPROEX SODIUM ER 500 MG PO TB24
500.0000 mg | ORAL_TABLET | Freq: Every day | ORAL | Status: DC
  Administered 2020-05-26 – 2020-05-28 (×3): 500 mg via ORAL
  Filled 2020-05-26 (×3): qty 1

## 2020-05-26 NOTE — ED Notes (Signed)
ED TO INPATIENT HANDOFF REPORT  ED Nurse Name and Phone #:   S Name/Age/Gender [name] 69 y.o. female Room/Bed: APA05/APA05  Code Status   Code Status: Full Code  Home/SNF/Other Home Patient oriented to: self Is this baseline? No   Triage Complete: Triage complete  Chief Complaint SBO (small bowel obstruction) ([place]) [O24.235]  Triage Note Pt reports burning indigestion in upper abd, n/v/d since Aug 20.  PT says has appt with pcp today but says was too uncomfortable to wait.  Also reports abd bloating.    Allergies No Known Allergies  Level of Care/Admitting Diagnosis ED Disposition    ED Disposition Condition Comment   Admit  Hospital Area: [facility] [[id number]]  Level of Care: Med-Surg [16]  Covid Evaluation: Confirmed COVID Negative  Diagnosis: SBO (small bowel obstruction) ([place]) [154008]  Admitting Physician: Barton Dubois [[id number]]  Attending Physician: Barton Dubois [[id number]]  Estimated length of stay: past midnight tomorrow  Certification:: I certify this patient will need inpatient services for at least 2 midnights       B Medical/Surgery History Past Medical History:  Diagnosis Date  . Allergy   . Anal carcinoma [facility]) oncologist-  dr Whitney Muse ([facility])   dx 12/ 2016 [facility] --  chemo and radiation- Dr. Marcello Moores  . Anemia    due to her cancer  . Anxiety    Dr. Harrington Challenger at [place] (psychiatrist).    . Carcinoma of ovary, stage 3 [facility]) oncologist-  dr gehrig/  dr Doreene Eland (cancer center in [place])--  no recurrency   dx 09/ 2015  Stage IIIB  papillary ovarian carcinoma  s/p  omentectomy and BSO &  chemotherapy (completed 12-07-2014)  . Chemotherapy induced nausea and vomiting 10/23/2014  . Chronic kidney disease    stones  . DDD (degenerative disc disease), lumbosacral   . GERD (gastroesophageal reflux disease)   . Headache(784.0)   . History of acute respiratory failure    05-30-2009  drug overdose-- (intubated for 2 days)  and  aspiration pneumonia  . History of adenomatous polyp of colon 10/07/2015   tubular adenoma high grade dysplasia  . History of herpes genitalis   . History of kidney stones   . History of ovarian cancer 08/03/2015  . History of suicide attempt    per documentation in epic  05-30-2009  overdose benaodiazepine  . HTN (hypertension)    takes Metoprolol daily  . Hyperlipidemia   . Hypertension 09/27/2016  . IBS (irritable bowel syndrome)    takes Bentyl daily  . Internal carotid artery stenosis, left    <50%, ulcerative plaque at carotid bulb.   . Major depression   . Mood disorder ([place])   . OA (osteoarthritis)    both hips  . Ovarian cancer ([place])    2015-TAH/BSO  . Prolapse of vaginal vault after hysterectomy 07/20/2015  . Rectovaginal fistula 08/05/2015  . Sigmoid diverticulosis   . Smokers' cough [facility])    Past Surgical History:  Procedure Laterality Date  . BREAST ENHANCEMENT SURGERY  1986  . [place]  . COLONOSCOPY N/A 10/07/2015   Procedure: COLONOSCOPY;  Surgeon: Rogene Houston, MD;  Location[facility];  Service: Endoscopy;  Laterality: N/A;  7:30  . EXPLORATORY LAPAROTOMY/ OMENTECTOMY/  BILATERAL SALPINGOOPHORECTOMY/  [facility] PLACEMENT  07-03-2014   [facility]  . KNEE ARTHROSCOPY Left 09-22-2004  . LAPAROSCOPY N/A 06/02/2014   Procedure: DIAGNOSTIC LAPAROSCOPY, OMENTAL BIOPSY, RIGHT OVARY BIOPSY, LYSIS OF ADHESIONS;  Surgeon: Fanny Skates, MD;  Location: [place];  Service: General;  Laterality: N/A;  . MUCOSAL ADVANCEMENT FLAP N/A 06/15/2016   Procedure: EXCISION RECTOVAGINAOL FISTULA WITH MUCOSAL ADVANCEMENT FLAP;  Surgeon: Leighton Ruff, MD;  Location: [place];  Service: General;  Laterality: N/A;  . PLACEMENT OF SETON N/A 09/09/2015   Procedure: PLACEMENT OF SETON;  Surgeon: Leighton Ruff, MD;  Location: [place];  Service: General;  Laterality: N/A;  . PORT-A-CATH REMOVAL Right 08/17/2014   Procedure: REMOVAL  INTRAPERITONEAL CHEMO PORT;  Surgeon: Fanny Skates, MD;  Location: [place];  Service: General;  Laterality: Right;  . PORTACATH PLACEMENT Right 08/17/2014   Procedure:  PLACE NEW PORT A CATH;  Surgeon: Fanny Skates, MD;  Location: [place];  Service: General;  Laterality: Right;  . RECTAL BIOPSY N/A 09/09/2015   Procedure: BIOPSY OF RECTOVAGINAL MASS;  Surgeon: Leighton Ruff, MD;  Location: [place];  Service: General;  Laterality: N/A;  . TUBAL LIGATION  YRS AGO  . VAGINAL HYSTERECTOMY  1981   fibroids     A IV Location/Drains/Wounds Patient Lines/Drains/Airways Status    Active Line/Drains/Airways    Name Placement date Placement time Site Days   Implanted Port 08/17/14 Right Chest 08/17/14  0938  Chest  2109   Implanted Port Right Chest --  --  Chest     Implanted Port Right Chest --  --  Chest     Peripheral IV 05/25/20 Right Forearm 05/25/20  1736  Forearm  1   Open Drain 1 Midline Perineal 2 Fr. 09/09/15  1410  Perineal  1721   Incision (Closed) 06/02/14 Abdomen Other (Comment) 06/02/14  1015   2185   Incision (Closed) 08/17/14 Chest Right 08/17/14  0957   2109   Incision (Closed) 08/17/14 Abdomen Right 08/17/14  0957   2109   Incision (Closed) 09/09/15 Rectum Other (Comment) 09/09/15  1456   1721   Incision (Closed) 06/15/16 Rectum Other (Comment) 06/15/16  1130   1441   Incision - 4 Ports Abdomen 1: Umbilicus 2: Left;Mid;Upper 3: Left;Lateral;Upper 4: Left;Lateral;Lower 06/02/14  0955   2185          Intake/Output Last 24 hours  Intake/Output Summary (Last 24 hours) at 05/26/2020 1341 Last data filed at 05/26/2020 1121 Gross per 24 hour  Intake 2674.55 ml  Output --  Net 2674.55 ml    Labs/Imaging Results for orders placed or performed during the hospital encounter of 05/25/20 (from the past 48 hour(s))  Urinalysis, Routine w reflex microscopic Urine, Clean Catch     Status: Abnormal   Collection Time: 05/25/20  11:03 AM  Result Value Ref Range   Color, Urine YELLOW YELLOW   APPearance HAZY (A) CLEAR   Specific Gravity, Urine 1.019 1.005 - 1.030   pH 7.0 5.0 - 8.0   Glucose, UA NEGATIVE NEGATIVE mg/dL   Hgb urine dipstick NEGATIVE NEGATIVE   Bilirubin Urine NEGATIVE NEGATIVE   Ketones, ur 20 (A) NEGATIVE mg/dL   Protein, ur NEGATIVE NEGATIVE mg/dL   Nitrite NEGATIVE NEGATIVE   Leukocytes,Ua NEGATIVE NEGATIVE    Comment: Performed at [facility], [address], [place],  [address]  CBC with Differential     Status: None   Collection Time: 05/25/20 11:11 AM  Result Value Ref Range   WBC 4.9 4.0 - 10.5 K/uL   RBC 4.13 3.87 - 5.11 MIL/uL   Hemoglobin 12.9 12.0 - 15.0 g/dL   HCT  40.2 36 - 46 %   MCV 97.3 80.0 - 100.0 fL   MCH 31.2 26.0 - 34.0 pg   MCHC 32.1 30.0 - 36.0 g/dL   RDW 14.7 11.5 - 15.5 %   Platelets 223 150 - 400 K/uL   nRBC 0.0 0.0 - 0.2 %   Neutrophils Relative % 73 %   Neutro Abs 3.6 1.7 - 7.7 K/uL   Lymphocytes Relative 15 %   Lymphs Abs 0.8 0.7 - 4.0 K/uL   Monocytes Relative 11 %   Monocytes Absolute 0.5 0 - 1 K/uL   Eosinophils Relative 1 %   Eosinophils Absolute 0.1 0 - 0 K/uL   Basophils Relative 0 %   Basophils Absolute 0.0 0 - 0 K/uL   Immature Granulocytes 0 %   Abs Immature Granulocytes 0.02 0.00 - 0.07 K/uL    Comment: Performed at [facility], [address] [place], [place] [address]  Comprehensive metabolic panel     Status: Abnormal   Collection Time: 05/25/20 11:11 AM  Result Value Ref Range   Sodium 140 135 - 145 mmol/L   Potassium 3.4 (L) 3.5 - 5.1 mmol/L   Chloride 96 (L) 98 - 111 mmol/L   CO2 31 22 - 32 mmol/L   Glucose, Bld 127 (H) 70 - 99 mg/dL    Comment: Glucose reference range applies only to samples taken after fasting for at least 8 hours.   BUN 16 8 - 23 mg/dL   Creatinine, Ser 0.72 0.44 - 1.00 mg/dL   Calcium 9.2 8.9 - 10.3 mg/dL   Total Protein 7.2 6.5 - 8.1 g/dL   Albumin 3.9 3.5 - 5.0 g/dL   AST 21 15 - 41 U/L   ALT 14 0  - 44 U/L   Alkaline Phosphatase 58 38 - 126 U/L   Total Bilirubin 0.3 0.3 - 1.2 mg/dL   GFR calc non Af Amer >60 >60 mL/min   GFR calc Af Amer >60 >60 mL/min   Anion gap 13 5 - 15    Comment: Performed at [facility], [address] [place], [place] [address]  CA 125     Status: None   Collection Time: 05/25/20 11:11 AM  Result Value Ref Range   Cancer Antigen (CA) 125 18.8 0.0 - 38.1 U/mL    Comment: (NOTE) Roche Diagnostics Electrochemiluminescence Immunoassay (ECLIA) Values obtained with different assay methods or kits cannot be used interchangeably.  Results cannot be interpreted as absolute evidence of the presence or absence of malignant disease. Performed At: [facility] [place], [place] [id number] Rush Farmer MD [id number]   Cancer antigen 19-9     Status: None   Collection Time: 05/25/20 11:11 AM  Result Value Ref Range   CA 19-9 9 0 - 35 U/mL    Comment: (NOTE) Roche Diagnostics Electrochemiluminescence Immunoassay (ECLIA) Values obtained with different assay methods or kits cannot be used interchangeably.  Results cannot be interpreted as absolute evidence of the presence or absence of malignant disease. Performed At: [facility] [place], [place] [id number] Rush Farmer MD [id number]   Magnesium     Status: None   Collection Time: 05/25/20 11:11 AM  Result Value Ref Range   Magnesium 2.3 1.7 - 2.4 mg/dL    Comment: Performed at [facility], [address], [place], [place] [address]  SARS Coronavirus 2 by RT PCR (hospital order, performed in [facility] hospital lab) Nasopharyngeal Nasopharyngeal Swab  Status: None   Collection Time: 05/25/20  3:16 PM   Specimen: Nasopharyngeal Swab  Result Value Ref Range   SARS Coronavirus 2 NEGATIVE NEGATIVE    Comment: (NOTE) SARS-CoV-2 target nucleic acids are NOT DETECTED.  The SARS-CoV-2 RNA is generally detectable in upper and lower respiratory specimens  during the acute phase of infection. The lowest concentration of SARS-CoV-2 viral copies this assay can detect is 250 copies / mL. A negative result does not preclude SARS-CoV-2 infection and should not be used as the sole basis for treatment or other patient management decisions.  A negative result may occur with improper specimen collection / handling, submission of specimen other than nasopharyngeal swab, presence of viral mutation(s) within the areas targeted by this assay, and inadequate number of viral copies (<250 copies / mL). A negative result must be combined with clinical observations, patient history, and epidemiological information.  Fact Sheet for Patients:   StrictlyIdeas.no  Fact Sheet for Healthcare Providers: BankingDealers.co.za  This test is not yet approved or  cleared by the [place] FDA and has been authorized for detection and/or diagnosis of SARS-CoV-2 by FDA under an Emergency Use Authorization (EUA).  This EUA will remain in effect (meaning this test can be used) for the duration of the COVID-19 declaration under Section 564(b)(1) of the Act, 21 U.S.C. section 360bbb-3(b)(1), unless the authorization is terminated or revoked sooner.  Performed at [facility], [address], [place], [place] [address]   HIV Antibody (routine testing w rflx)     Status: None   Collection Time: 05/26/20  3:21 AM  Result Value Ref Range   HIV Screen 4th Generation wRfx Non Reactive Non Reactive    Comment: Performed at [facility], [place] [address] [place], [place] [address]  Basic metabolic panel     Status: Abnormal   Collection Time: 05/26/20  3:21 AM  Result Value Ref Range   Sodium 140 135 - 145 mmol/L   Potassium 3.2 (L) 3.5 - 5.1 mmol/L   Chloride 100 98 - 111 mmol/L   CO2 27 22 - 32 mmol/L   Glucose, Bld 102 (H) 70 - 99 mg/dL    Comment: Glucose reference range applies only to samples taken after fasting  for at least 8 hours.   BUN 7 (L) 8 - 23 mg/dL   Creatinine, Ser 0.54 0.44 - 1.00 mg/dL   Calcium 8.9 8.9 - 10.3 mg/dL   GFR calc non Af Amer >60 >60 mL/min   GFR calc Af Amer >60 >60 mL/min   Anion gap 13 5 - 15    Comment: Performed at [facility], [address], [place], [place] [address]   CT ABDOMEN PELVIS W CONTRAST  Result Date: 05/25/2020 CLINICAL DATA:  Abdominal pain, history of ovarian and anal cancer. EXAM: CT ABDOMEN AND PELVIS WITH CONTRAST TECHNIQUE: Multidetector CT imaging of the abdomen and pelvis was performed using the standard protocol following bolus administration of intravenous contrast. CONTRAST:  165mL OMNIPAQUE IOHEXOL 300 MG/ML  SOLN COMPARISON:  CT abdomen pelvis dated 09/22/2015. FINDINGS: Lower chest: There is mild bibasilar atelectasis. Hepatobiliary: No focal liver abnormality is seen. No gallstones, gallbladder wall thickening, or biliary dilatation. Pancreas: Unremarkable. No pancreatic ductal dilatation or surrounding inflammatory changes. Spleen: Normal in size without focal abnormality. Adrenals/Urinary Tract: Adrenal glands are unremarkable. Kidneys are normal, without renal calculi, focal lesion, or hydronephrosis. Stomach/Bowel: A posterior gastric fundal diverticulum is redemonstrated. There are multiple loops of dilated small bowel with fecalization  of small bowel contents and a transition point to decompressed distal small bowel in the pelvis (series 2, image 60 2-66) most consistent with a high-grade small bowel obstruction. There is a moderate amount of free intraperitoneal fluid. There is no definite mass lesion at the site of transition to decompressed distal bowel. Vascular/Lymphatic: Aortic atherosclerosis. No enlarged abdominal or pelvic lymph nodes. Reproductive: Pelvic floor laxity and cystocele are redemonstrated. Status post hysterectomy. No adnexal masses. Other: No abdominal wall hernia. Musculoskeletal: Degenerative changes are seen in the spine.  IMPRESSION: 1. Findings most consistent with a high-grade small bowel obstruction with a transition point in the pelvis. Moderate amount of free intraperitoneal fluid. No definite mass lesion at the site of transition to decompressed distal bowel. 2. Pelvic floor laxity and cystocele. Aortic Atherosclerosis (ICD10-I70.0). These results were called by telephone at the time of interpretation on 05/25/2020 at 2:19 pm to provider [facility] , who verbally acknowledged these results. Electronically Signed   By: Zerita Boers M.D.   On: 05/25/2020 14:20    Pending Labs Unresulted Labs (From admission, onward)          Start     Ordered   05/27/20 2979  Basic metabolic panel  Tomorrow morning,   R        05/26/20 0831   05/27/20 0500  Magnesium  Tomorrow morning,   R        05/26/20 0831          Vitals/Pain Today's Vitals   05/26/20 0340 05/26/20 0515 05/26/20 0816 05/26/20 1126  BP:   (!) 164/79 (!) 166/80  Pulse: 81 82 75 71  Resp: 17  19 18   Temp:   98 F (36.7 C) 98 F (36.7 C)  TempSrc:   Oral Oral  SpO2: 96% 98% 100% 99%  Weight:      Height:      PainSc:    0-No pain    Isolation Precautions No active isolations  Medications Medications  dextrose 5 % and 0.9 % NaCl with KCl 40 mEq/L infusion ( Intravenous Stopped 05/26/20 1121)  LORazepam (ATIVAN) injection 0.5 mg (0.5 mg Intravenous Given 05/25/20 2353)  heparin injection 5,000 Units (5,000 Units Subcutaneous Given 05/26/20 1333)  morphine 2 MG/ML injection 2 mg (has no administration in time range)  ondansetron (ZOFRAN) tablet 4 mg (has no administration in time range)    Or  ondansetron (ZOFRAN) injection 4 mg (has no administration in time range)  hydrALAZINE (APRESOLINE) injection 10 mg (has no administration in time range)  potassium chloride 10 mEq in 100 mL IVPB (10 mEq Intravenous New Bag/Given 05/26/20 1143)  sodium chloride 0.9 % bolus 1,000 mL (0 mLs Intravenous Stopped 05/25/20 1452)  iohexol (OMNIPAQUE) 300  MG/ML solution 100 mL (100 mLs Intravenous Contrast Given 05/25/20 1347)  LORazepam (ATIVAN) injection 0.5 mg (0.5 mg Intravenous Given 05/25/20 1756)  magnesium sulfate IVPB 1 g 100 mL (1 g Intravenous New Bag/Given 05/26/20 1121)    Mobility walks Low fall risk   Focused Assessments    R Recommendations: See Admitting Provider Note  Report given to:   Additional Notes:

## 2020-05-26 NOTE — Consult Note (Signed)
[facility] Consult  Reason for Consult: SBO  Referring Physician:  Alyse Low PA (ED), Dr. Dyann Kief   Chief Complaint    Abdominal Pain      HPI: Abigail Wagner is a 69 y.o. female with a history of anal cancer s/p chemoradiation and local treatment and ovarian cancer surgery and debulking and chemotherapy who comes in with reported no BM for several days. She is on chronic pain medication and has to take miralax to have a BM. She reported some nausea but no vomiting. She follows with the cancer for her cancer history. Aside from no BM she has had 2 weeks of abdominal bloating and had 1 episode of vomiting. She has never had a bowel obstruction.  She came to the ED and there was a CT done with concern for SBO with some ascites. She says she has been having flatus.   Past Medical History:  Diagnosis Date  . Allergy   . Anal carcinoma [facility]) oncologist-  dr Whitney Muse ([facility])   dx 12/ 2016 [facility] --  chemo and radiation- Dr. Marcello Moores  . Anemia    due to her cancer  . Anxiety    Dr. Harrington Challenger at [place] (psychiatrist).    . Carcinoma of ovary, stage 3 [facility]) oncologist-  dr gehrig/  dr Doreene Eland (cancer center in [place])--  no recurrency   dx 09/ 2015  Stage IIIB  papillary ovarian carcinoma  s/p  omentectomy and BSO &  chemotherapy (completed 12-07-2014)  . Chemotherapy induced nausea and vomiting 10/23/2014  . Chronic kidney disease    stones  . DDD (degenerative disc disease), lumbosacral   . GERD (gastroesophageal reflux disease)   . Headache(784.0)   . History of acute respiratory failure    05-30-2009  drug overdose-- (intubated for 2 days)  and aspiration pneumonia  . History of adenomatous polyp of colon 10/07/2015   tubular adenoma high grade dysplasia  . History of herpes genitalis   . History of kidney stones   . History of ovarian cancer 08/03/2015  . History of suicide attempt    per documentation in epic  05-30-2009  overdose benaodiazepine  .  HTN (hypertension)    takes Metoprolol daily  . Hyperlipidemia   . Hypertension 09/27/2016  . IBS (irritable bowel syndrome)    takes Bentyl daily  . Internal carotid artery stenosis, left    <50%, ulcerative plaque at carotid bulb.   . Major depression   . Mood disorder ([place])   . OA (osteoarthritis)    both hips  . Ovarian cancer ([place])    2015-TAH/BSO  . Prolapse of vaginal vault after hysterectomy 07/20/2015  . Rectovaginal fistula 08/05/2015  . Sigmoid diverticulosis   . Smokers' cough [facility])     Past Surgical History:  Procedure Laterality Date  . BREAST ENHANCEMENT SURGERY  1986  . [place]  . COLONOSCOPY N/A 10/07/2015   Procedure: COLONOSCOPY;  Surgeon: Rogene Houston, MD;  [facility];  Service: Endoscopy;  Laterality: N/A;  7:30  . EXPLORATORY LAPAROTOMY/ OMENTECTOMY/  BILATERAL SALPINGOOPHORECTOMY/  [facility]  07-03-2014   [facility]  . KNEE ARTHROSCOPY Left 09-22-2004  . LAPAROSCOPY N/A 06/02/2014   Procedure: DIAGNOSTIC LAPAROSCOPY, OMENTAL BIOPSY, RIGHT OVARY BIOPSY, LYSIS OF ADHESIONS;  Surgeon: Fanny Skates, MD;  Location: [place];  Service: General;  Laterality: N/A;  . MUCOSAL ADVANCEMENT FLAP N/A 06/15/2016   Procedure: EXCISION RECTOVAGINAOL FISTULA WITH MUCOSAL ADVANCEMENT FLAP;  Surgeon: Leighton Ruff, MD;  Location: [facility];  Service: General;  Laterality: N/A;  . PLACEMENT OF SETON N/A 09/09/2015   Procedure: PLACEMENT OF SETON;  Surgeon: Leighton Ruff, MD;  Location: [facility];  Service: General;  Laterality: N/A;  . PORT-A-CATH REMOVAL Right 08/17/2014   Procedure: REMOVAL INTRAPERITONEAL CHEMO PORT;  Surgeon: Fanny Skates, MD;  Location: [place];  Service: General;  Laterality: Right;  . PORTACATH PLACEMENT Right 08/17/2014   Procedure:  PLACE NEW PORT A CATH;  Surgeon: Fanny Skates, MD;  Location: [place];  Service: General;  Laterality: Right;   . RECTAL BIOPSY N/A 09/09/2015   Procedure: BIOPSY OF RECTOVAGINAL MASS;  Surgeon: Leighton Ruff, MD;  Location: [place];  Service: General;  Laterality: N/A;  . TUBAL LIGATION  YRS AGO  . VAGINAL HYSTERECTOMY  1981   fibroids    Family History  Problem Relation Age of Onset  . Bipolar disorder Mother   . Anxiety disorder Mother   . Dementia Mother   . Depression Mother   . Mental illness Mother   . Vision loss Mother   . Alzheimer's disease Mother   . Alcohol abuse Paternal Uncle   . Bipolar disorder Maternal Grandmother   . Dementia Maternal Grandmother   . Alzheimer's disease Maternal Grandmother   . Alcohol abuse Paternal Uncle   . Stroke Brother   . Deep vein thrombosis Son   . Heart disease Father   . Hyperlipidemia Father   . Hypertension Father   . Stroke Father   . Vision loss Father   . Atrial fibrillation Sister   . Heart disease Brother   . Heart disease Brother   . ADD / ADHD Neg Hx   . Drug abuse Neg Hx   . OCD Neg Hx   . Paranoid behavior Neg Hx   . Schizophrenia Neg Hx   . Seizures Neg Hx   . Sexual abuse Neg Hx   . Physical abuse Neg Hx     Social History   Tobacco Use  . Smoking status: Current Every Day Smoker    Packs/day: 0.50    Years: 43.00    Pack years: 21.50    Types: Cigarettes  . Smokeless tobacco: Never Used  Vaping Use  . Vaping Use: Never used  Substance Use Topics  . Alcohol use: No  . Drug use: No    Medications: I have reviewed the patient's current medications. Current Facility-Administered Medications  Medication Dose Route Frequency Provider Last Rate Last Admin  . dextrose 5 % and 0.9 % NaCl with KCl 40 mEq/L infusion   Intravenous Continuous Emokpae, Ejiroghene E, MD   Stopped at 05/26/20 1121  . heparin injection 5,000 Units  5,000 Units Subcutaneous Q8H Emokpae, Ejiroghene E, MD   5,000 Units at 05/26/20 1333  . hydrALAZINE (APRESOLINE) injection 10 mg  10 mg Intravenous Q4H PRN Emokpae,  Ejiroghene E, MD      . LORazepam (ATIVAN) injection 0.5 mg  0.5 mg Intravenous Q8H PRN Emokpae, Ejiroghene E, MD   0.5 mg at 05/25/20 2353  . morphine 2 MG/ML injection 2 mg  2 mg Intravenous Q4H PRN Emokpae, Ejiroghene E, MD      . ondansetron (ZOFRAN) tablet 4 mg  4 mg Oral Q6H PRN Emokpae, Ejiroghene E, MD       Or  . ondansetron (ZOFRAN) injection 4 mg  4 mg Intravenous Q6H PRN Emokpae, Ejiroghene E,  MD        No Known Allergies   ROS:  A comprehensive review of systems was negative except for: Gastrointestinal: positive for constipation and bloating, nausea  Blood pressure (!) 166/80, pulse 71, temperature 98 F (36.7 C), temperature source Oral, resp. rate 18, height 5\' 5"  (1.651 m), weight 62.1 kg, SpO2 99 %. Physical Exam Vitals reviewed.  Constitutional:      Appearance: She is well-developed.  HENT:     Head: Normocephalic.  Eyes:     Extraocular Movements: Extraocular movements intact.  Cardiovascular:     Rate and Rhythm: Normal rate and regular rhythm.  Pulmonary:     Effort: Pulmonary effort is normal.     Breath sounds: Normal breath sounds.  Abdominal:     General: There is distension.     Palpations: Abdomen is soft.     Tenderness: There is no abdominal tenderness.  Skin:    General: Skin is warm.  Neurological:     General: No focal deficit present.     Mental Status: She is alert and oriented to person, place, and time.  Psychiatric:        Mood and Affect: Mood normal.        Behavior: Behavior normal.     Results: Results for orders placed or performed during the hospital encounter of 05/25/20 (from the past 48 hour(s))  Urinalysis, Routine w reflex microscopic Urine, Clean Catch     Status: Abnormal   Collection Time: 05/25/20 11:03 AM  Result Value Ref Range   Color, Urine YELLOW YELLOW   APPearance HAZY (A) CLEAR   Specific Gravity, Urine 1.019 1.005 - 1.030   pH 7.0 5.0 - 8.0   Glucose, UA NEGATIVE NEGATIVE mg/dL   Hgb urine dipstick  NEGATIVE NEGATIVE   Bilirubin Urine NEGATIVE NEGATIVE   Ketones, ur 20 (A) NEGATIVE mg/dL   Protein, ur NEGATIVE NEGATIVE mg/dL   Nitrite NEGATIVE NEGATIVE   Leukocytes,Ua NEGATIVE NEGATIVE    Comment: Performed at [facility], [address] [place], [place] [address]  CBC with Differential     Status: None   Collection Time: 05/25/20 11:11 AM  Result Value Ref Range   WBC 4.9 4.0 - 10.5 K/uL   RBC 4.13 3.87 - 5.11 MIL/uL   Hemoglobin 12.9 12.0 - 15.0 g/dL   HCT 40.2 36 - 46 %   MCV 97.3 80.0 - 100.0 fL   MCH 31.2 26.0 - 34.0 pg   MCHC 32.1 30.0 - 36.0 g/dL   RDW 14.7 11.5 - 15.5 %   Platelets 223 150 - 400 K/uL   nRBC 0.0 0.0 - 0.2 %   Neutrophils Relative % 73 %   Neutro Abs 3.6 1.7 - 7.7 K/uL   Lymphocytes Relative 15 %   Lymphs Abs 0.8 0.7 - 4.0 K/uL   Monocytes Relative 11 %   Monocytes Absolute 0.5 0 - 1 K/uL   Eosinophils Relative 1 %   Eosinophils Absolute 0.1 0 - 0 K/uL   Basophils Relative 0 %   Basophils Absolute 0.0 0 - 0 K/uL   Immature Granulocytes 0 %   Abs Immature Granulocytes 0.02 0.00 - 0.07 K/uL    Comment: Performed at [facility], [address] [place], [place] [address]  Comprehensive metabolic panel     Status: Abnormal   Collection Time: 05/25/20 11:11 AM  Result Value Ref Range   Sodium 140 135 - 145 mmol/L   Potassium 3.4 (L) 3.5 -  5.1 mmol/L   Chloride 96 (L) 98 - 111 mmol/L   CO2 31 22 - 32 mmol/L   Glucose, Bld 127 (H) 70 - 99 mg/dL    Comment: Glucose reference range applies only to samples taken after fasting for at least 8 hours.   BUN 16 8 - 23 mg/dL   Creatinine, Ser 0.72 0.44 - 1.00 mg/dL   Calcium 9.2 8.9 - 10.3 mg/dL   Total Protein 7.2 6.5 - 8.1 g/dL   Albumin 3.9 3.5 - 5.0 g/dL   AST 21 15 - 41 U/L   ALT 14 0 - 44 U/L   Alkaline Phosphatase 58 38 - 126 U/L   Total Bilirubin 0.3 0.3 - 1.2 mg/dL   GFR calc non Af Amer >60 >60 mL/min   GFR calc Af Amer >60 >60 mL/min   Anion gap 13 5 - 15    Comment: Performed at [facility], [address], [place], [place] [address]  CA 125     Status: None   Collection Time: 05/25/20 11:11 AM  Result Value Ref Range   Cancer Antigen (CA) 125 18.8 0.0 - 38.1 U/mL    Comment: (NOTE) Roche Diagnostics Electrochemiluminescence Immunoassay (ECLIA) Values obtained with different assay methods or kits cannot be used interchangeably.  Results cannot be interpreted as absolute evidence of the presence or absence of malignant disease. Performed At: [facility] [place], [place] [id number] Rush Farmer MD SH:[id number]   Cancer antigen 19-9     Status: None   Collection Time: 05/25/20 11:11 AM  Result Value Ref Range   CA 19-9 9 0 - 35 U/mL    Comment: (NOTE) Roche Diagnostics Electrochemiluminescence Immunoassay (ECLIA) Values obtained with different assay methods or kits cannot be used interchangeably.  Results cannot be interpreted as absolute evidence of the presence or absence of malignant disease. Performed At: [facility] [place], [place] [id number] Rush Farmer MD NO:[id number]   Magnesium     Status: None   Collection Time: 05/25/20 11:11 AM  Result Value Ref Range   Magnesium 2.3 1.7 - 2.4 mg/dL    Comment: Performed at [facility], [address], [place], [place] [address]  SARS Coronavirus 2 by RT PCR (hospital order, performed in [facility] hospital lab) Nasopharyngeal Nasopharyngeal Swab     Status: None   Collection Time: 05/25/20  3:16 PM   Specimen: Nasopharyngeal Swab  Result Value Ref Range   SARS Coronavirus 2 NEGATIVE NEGATIVE    Comment: (NOTE) SARS-CoV-2 target nucleic acids are NOT DETECTED.  The SARS-CoV-2 RNA is generally detectable in upper and lower respiratory specimens during the acute phase of infection. The lowest concentration of SARS-CoV-2 viral copies this assay can detect is 250 copies / mL. A negative result does not preclude SARS-CoV-2 infection and should not be used as  the sole basis for treatment or other patient management decisions.  A negative result may occur with improper specimen collection / handling, submission of specimen other than nasopharyngeal swab, presence of viral mutation(s) within the areas targeted by this assay, and inadequate number of viral copies (<250 copies / mL). A negative result must be combined with clinical observations, patient history, and epidemiological information.  Fact Sheet for Patients:   StrictlyIdeas.no  Fact Sheet for Healthcare Providers: BankingDealers.co.za  This test is not yet approved or  cleared by the [place] FDA and has been authorized for detection and/or diagnosis of SARS-CoV-2 by FDA under an  Emergency Use Authorization (EUA).  This EUA will remain in effect (meaning this test can be used) for the duration of the COVID-19 declaration under Section 564(b)(1) of the Act, 21 U.S.C. section 360bbb-3(b)(1), unless the authorization is terminated or revoked sooner.  Performed at [facility] [place] [facility], [address] [place], [place] [address]   HIV Antibody (routine testing w rflx)     Status: None   Collection Time: 05/26/20  3:21 AM  Result Value Ref Range   HIV Screen 4th Generation wRfx Non Reactive Non Reactive    Comment: Performed at [facility], [place] [address], [place], [place] [address]  Basic metabolic panel     Status: Abnormal   Collection Time: 05/26/20  3:21 AM  Result Value Ref Range   Sodium 140 135 - 145 mmol/L   Potassium 3.2 (L) 3.5 - 5.1 mmol/L   Chloride 100 98 - 111 mmol/L   CO2 27 22 - 32 mmol/L   Glucose, Bld 102 (H) 70 - 99 mg/dL    Comment: Glucose reference range applies only to samples taken after fasting for at least 8 hours.   BUN 7 (L) 8 - 23 mg/dL   Creatinine, Ser 0.54 0.44 - 1.00 mg/dL   Calcium 8.9 8.9 - 10.3 mg/dL   GFR calc non Af Amer >60 >60 mL/min   GFR calc Af Amer >60 >60 mL/min   Anion gap 13 5 - 15     Comment: Performed at [facility], [address], [place], [place] [address]   Dilated small bowel with fecalization, transition in the pelvis CT ABDOMEN PELVIS W CONTRAST  Result Date: 05/25/2020 CLINICAL DATA:  Abdominal pain, history of ovarian and anal cancer. EXAM: CT ABDOMEN AND PELVIS WITH CONTRAST TECHNIQUE: Multidetector CT imaging of the abdomen and pelvis was performed using the standard protocol following bolus administration of intravenous contrast. CONTRAST:  164mL OMNIPAQUE IOHEXOL 300 MG/ML  SOLN COMPARISON:  CT abdomen pelvis dated 09/22/2015. FINDINGS: Lower chest: There is mild bibasilar atelectasis. Hepatobiliary: No focal liver abnormality is seen. No gallstones, gallbladder wall thickening, or biliary dilatation. Pancreas: Unremarkable. No pancreatic ductal dilatation or surrounding inflammatory changes. Spleen: Normal in size without focal abnormality. Adrenals/Urinary Tract: Adrenal glands are unremarkable. Kidneys are normal, without renal calculi, focal lesion, or hydronephrosis. Stomach/Bowel: A posterior gastric fundal diverticulum is redemonstrated. There are multiple loops of dilated small bowel with fecalization of small bowel contents and a transition point to decompressed distal small bowel in the pelvis (series 2, image 60 2-66) most consistent with a high-grade small bowel obstruction. There is a moderate amount of free intraperitoneal fluid. There is no definite mass lesion at the site of transition to decompressed distal bowel. Vascular/Lymphatic: Aortic atherosclerosis. No enlarged abdominal or pelvic lymph nodes. Reproductive: Pelvic floor laxity and cystocele are redemonstrated. Status post hysterectomy. No adnexal masses. Other: No abdominal wall hernia. Musculoskeletal: Degenerative changes are seen in the spine. IMPRESSION: 1. Findings most consistent with a high-grade small bowel obstruction with a transition point in the pelvis. Moderate amount of free  intraperitoneal fluid. No definite mass lesion at the site of transition to decompressed distal bowel. 2. Pelvic floor laxity and cystocele. Aortic Atherosclerosis (ICD10-I70.0). These results were called by telephone at the time of interpretation on 05/25/2020 at 2:19 pm to provider [facility] , who verbally acknowledged these results. Electronically Signed   By: Zerita Boers M.D.   On: 05/25/2020 14:20     Assessment & Plan:  ITALIA WOLFERT  is a 69 y.o. female with a SBO on CT but passing flatus. She has not had a BM but wants to try clears.  -Ok to [employer] as tolerated -May need to do SBFT in the AM if no BM -Have to consider potential for cancer recurrence given history. Tumor markers were within normal limits but Ca 125 is trending up   All questions were answered to the satisfaction of the patient and family. Discussed with Dr.Madera.   Virl Cagey 05/26/2020, 2:40 PM

## 2020-05-26 NOTE — Progress Notes (Signed)
PROGRESS NOTE    Abigail Wagner  KKX:[id number] DOB: 1951-08-17 DOA: 05/25/2020 PCP: Susy Frizzle, MD    Chief Complaint  Patient presents with  . Abdominal Pain    Brief Narrative:  As per H&P written by Dr. Denton Brick on 05/25/20 Abigail Wagner is a 69 y.o. female with medical history significant for very large anal cancer, hypertension, bipolar disorder and depression. Patient presented to the ED with complaints of abdominal bloating over the past 2 weeks.  She had one episode of vomiting about 4 days ago.  Initially had loose stools, but over the past several days she has become constipated.  Her last bowel movement was yesterday, it was a small amount and it was hard.  Patient has gotten both doses of her Covid vaccine.  ED Course: Vitals stable.  Potassium 3.4 otherwise unremarkable CBC BMP.  UA not suggestive of, ketones present.  Contrast shows a high-grade small bowel obstruction with a transition point in the pelvis.  Moderate amount of free intraperitoneal fluid.  No mass at the site of transition. EDP talked to general surgeon Dr. Constance Haw, recommended admission, NG tube.  N.p.o. will see patient.  Assessment & Plan: 1-SBO (small bowel obstruction) (HCC) -With concern for adhesions or return of colon cancer -Patient with debulk surgery for ovarian cancer and anal cancer in the past -High-grade transition obstruction appreciated on CT scan. -Reports minimal pain, no further nausea vomiting and is passing gas. -Will advance to clear liquids -Holding on NG tube for now -Appreciate assistance and recommendations by general surgery.  2-Depression/anxiety/Bipolar 1 disorder ([place]) -Continue as needed Ativan -Resume the use of Depakote and Lexapro  3-History of ovarian cancer/anal cancer -Continue outpatient follow-up surveillance with oncology service.  4-hypertension -Continue as needed hydralazine -Blood pressure stable  5-hypokalemia/hypomagnesemia -In the setting of  poor oral intake and GI losses prior to admission (vomiting) -Continue electrolyte repletion and fluid resuscitation. -Follow trend in the morning  6-nausea/vomiting -In the setting of problem #1 -Appears to be resolving -Continue to follow symptomatic improvement with current intervention.   DVT prophylaxis: Heparin Code Status: Full code Family Communication: Daughter updated over the phone. Disposition:   Status is: Inpatient  Dispo: The patient is from: Home              Anticipated d/c is to: Home              Anticipated d/c date is: 1-2 days              Patient currently is not medically ready for discharge; still having some abdominal discomfort and not able to demonstrate good tolerance of oral intake to maintain adequate hydration and nutrition.  Will slowly advance her diet and assess response.  Continue IV fluids, electrolyte repletion's and further supportive care.       Consultants:   General surgery   Procedures:  See below for x-ray reports.   Antimicrobials:  None   Subjective: Afebrile, no chest pain, reports no nausea vomiting.  Feeling slightly anxious and asking for her home medications.  Patient would like to have clear liquid diets.  Passing gas and reports small BM.  Objective: Vitals:   05/26/20 0816 05/26/20 1126 05/26/20 1444 05/26/20 1500  BP: (!) 164/79 (!) 166/80 (!) 164/81   Pulse: 75 71 77   Resp: 19 18 17    Temp: 98 F (36.7 C) 98 F (36.7 C) 98.2 F (36.8 C)   TempSrc: Oral Oral Oral   SpO2:  100% 99% 98%   Weight:      Height:    5\' 5"  (1.651 m)    Intake/Output Summary (Last 24 hours) at 05/26/2020 1818 Last data filed at 05/26/2020 1700 Gross per 24 hour  Intake 1070.29 ml  Output --  Net 1070.29 ml   Filed Weights   05/25/20 0953  Weight: 62.1 kg    Examination:  General exam: Appears calm and in no major distress; currently oriented to place and person.  Reports no nausea vomiting, having a small bowel movement  and passing gas.  Very little abdominal discomfort. Respiratory system: Clear to auscultation. Respiratory effort normal. Cardiovascular system: S1 & S2 heard, RRR. No JVD, murmurs, rubs, gallops or clicks. No pedal edema. Gastrointestinal system: Abdomen is mildly distended, soft and mildly tender to palpation in her mid abdomen.  Positive bowel sounds.   Central nervous system: Alert and oriented. No focal neurological deficits. Extremities: Symmetric 5 x 5 power. Skin: No rashes, lesions or ulcers Psychiatry: Mood & affect appropriate.     Data Reviewed: I have personally reviewed following labs and imaging studies  CBC: Recent Labs  Lab 05/25/20 1111  WBC 4.9  NEUTROABS 3.6  HGB 12.9  HCT 40.2  MCV 97.3  PLT 244    Basic Metabolic Panel: Recent Labs  Lab 05/25/20 1111 05/26/20 0321  NA 140 140  K 3.4* 3.2*  CL 96* 100  CO2 31 27  GLUCOSE 127* 102*  BUN 16 7*  CREATININE 0.72 0.54  CALCIUM 9.2 8.9  MG 2.3  --     GFR: Estimated Creatinine Clearance: 59.7 mL/min (by C-G formula based on SCr of 0.54 mg/dL).  Liver Function Tests: Recent Labs  Lab 05/25/20 1111  AST 21  ALT 14  ALKPHOS 58  BILITOT 0.3  PROT 7.2  ALBUMIN 3.9    CBG: No results for input(s): GLUCAP in the last 168 hours.   Recent Results (from the past 240 hour(s))  SARS Coronavirus 2 by RT PCR (hospital order, performed in [facility] hospital lab) Nasopharyngeal Nasopharyngeal Swab     Status: None   Collection Time: 05/25/20  3:16 PM   Specimen: Nasopharyngeal Swab  Result Value Ref Range Status   SARS Coronavirus 2 NEGATIVE NEGATIVE Final    Comment: (NOTE) SARS-CoV-2 target nucleic acids are NOT DETECTED.  The SARS-CoV-2 RNA is generally detectable in upper and lower respiratory specimens during the acute phase of infection. The lowest concentration of SARS-CoV-2 viral copies this assay can detect is 250 copies / mL. A negative result does not preclude SARS-CoV-2  infection and should not be used as the sole basis for treatment or other patient management decisions.  A negative result may occur with improper specimen collection / handling, submission of specimen other than nasopharyngeal swab, presence of viral mutation(s) within the areas targeted by this assay, and inadequate number of viral copies (<250 copies / mL). A negative result must be combined with clinical observations, patient history, and epidemiological information.  Fact Sheet for Patients:   StrictlyIdeas.no  Fact Sheet for Healthcare Providers: BankingDealers.co.za  This test is not yet approved or  cleared by the [place] FDA and has been authorized for detection and/or diagnosis of SARS-CoV-2 by FDA under an Emergency Use Authorization (EUA).  This EUA will remain in effect (meaning this test can be used) for the duration of the COVID-19 declaration under Section 564(b)(1) of the Act, 21 U.S.C. section 360bbb-3(b)(1), unless the authorization is terminated or revoked  sooner.  Performed at [facility], [address] [place], [place] [address]      Radiology Studies: CT ABDOMEN PELVIS W CONTRAST  Result Date: 05/25/2020 CLINICAL DATA:  Abdominal pain, history of ovarian and anal cancer. EXAM: CT ABDOMEN AND PELVIS WITH CONTRAST TECHNIQUE: Multidetector CT imaging of the abdomen and pelvis was performed using the standard protocol following bolus administration of intravenous contrast. CONTRAST:  144mL OMNIPAQUE IOHEXOL 300 MG/ML  SOLN COMPARISON:  CT abdomen pelvis dated 09/22/2015. FINDINGS: Lower chest: There is mild bibasilar atelectasis. Hepatobiliary: No focal liver abnormality is seen. No gallstones, gallbladder wall thickening, or biliary dilatation. Pancreas: Unremarkable. No pancreatic ductal dilatation or surrounding inflammatory changes. Spleen: Normal in size without focal abnormality. Adrenals/Urinary Tract:  Adrenal glands are unremarkable. Kidneys are normal, without renal calculi, focal lesion, or hydronephrosis. Stomach/Bowel: A posterior gastric fundal diverticulum is redemonstrated. There are multiple loops of dilated small bowel with fecalization of small bowel contents and a transition point to decompressed distal small bowel in the pelvis (series 2, image 60 2-66) most consistent with a high-grade small bowel obstruction. There is a moderate amount of free intraperitoneal fluid. There is no definite mass lesion at the site of transition to decompressed distal bowel. Vascular/Lymphatic: Aortic atherosclerosis. No enlarged abdominal or pelvic lymph nodes. Reproductive: Pelvic floor laxity and cystocele are redemonstrated. Status post hysterectomy. No adnexal masses. Other: No abdominal wall hernia. Musculoskeletal: Degenerative changes are seen in the spine. IMPRESSION: 1. Findings most consistent with a high-grade small bowel obstruction with a transition point in the pelvis. Moderate amount of free intraperitoneal fluid. No definite mass lesion at the site of transition to decompressed distal bowel. 2. Pelvic floor laxity and cystocele. Aortic Atherosclerosis (ICD10-I70.0). These results were called by telephone at the time of interpretation on 05/25/2020 at 2:19 pm to provider [facility] , who verbally acknowledged these results. Electronically Signed   By: Zerita Boers M.D.   On: 05/25/2020 14:20     Scheduled Meds: . divalproex  500 mg Oral Daily  . escitalopram  20 mg Oral BID  . heparin  5,000 Units Subcutaneous Q8H   Continuous Infusions: . dextrose 5 % and 0.9 % NaCl with KCl 40 mEq/L 75 mL/hr at 05/26/20 1812     LOS: 0 days    Time spent: 30 minutes    Barton Dubois, MD [facility]   To contact the attending provider between 7A-7P or the covering provider during after hours 7P-7A, please log into the web site [email or web address] and access using universal [place] password  for that web site. If you do not have the password, please call the hospital operator.  05/26/2020, 6:18 PM

## 2020-05-26 NOTE — ED Notes (Signed)
Pt in bed, pt states that she drank some sprite, has not vomited and feels well.

## 2020-05-26 NOTE — ED Notes (Signed)
Pt in bed, pt denies pain at this time, resps even and unlabored, pt disoriented, re oriented pt, bed in low position, call bell within reach, sitter at bedside.

## 2020-05-26 NOTE — ED Notes (Signed)
Po challenge with liquid per md Courage instructions.

## 2020-05-27 ENCOUNTER — Encounter (HOSPITAL_COMMUNITY): Payer: Self-pay | Admitting: Internal Medicine

## 2020-05-27 LAB — BASIC METABOLIC PANEL
Anion gap: 10 (ref 5–15)
BUN: 6 mg/dL — ABNORMAL LOW (ref 8–23)
CO2: 25 mmol/L (ref 22–32)
Calcium: 9.1 mg/dL (ref 8.9–10.3)
Chloride: 102 mmol/L (ref 98–111)
Creatinine, Ser: 0.5 mg/dL (ref 0.44–1.00)
GFR calc Af Amer: >60 mL/min (ref >60)
GFR calc non Af Amer: >60 mL/min (ref >60)
Glucose, Bld: 104 mg/dL — ABNORMAL HIGH (ref 70–99)
Potassium: 4 mmol/L (ref 3.5–5.1)
Sodium: 137 mmol/L (ref 135–145)

## 2020-05-27 LAB — MAGNESIUM: Magnesium: 2.1 mg/dL (ref 1.7–2.4)

## 2020-05-27 MED ORDER — ROPINIROLE HCL 0.25 MG PO TABS
0.5000 mg | ORAL_TABLET | Freq: Every day | ORAL | Status: AC
  Administered 2020-05-27: 0.5 mg via ORAL
  Filled 2020-05-27: qty 2

## 2020-05-27 NOTE — Progress Notes (Addendum)
I was present with the medical student for this service. I personally verified the history of present illness, performed the physical exam, and made the plan for this encounter. I have verified the medical student's documentation and made modifications where appropriately. I have personally documented in my own words a brief history, physical, and plan below.    Patient confused this Am and had delirium overnight. She says she has no pain or nausea but also says she does not want the juices because of acid. She says she had a BM but the RM reported it was just mucus.  She has had no vomiting.  BP (!) 155/73 (BP Location: Right Arm)   Pulse 81   Temp 98.6 F (37 C)   Resp 17   Ht 5\' 5"  (1.651 m)   Wt 62.1 kg   SpO2 98%   BMI 22.80 kg/m  NAD Sleeping Confused Soft, distended, non tender  Discussed Ct with Abigail Wagner. Pelvic SB with fecalization down to area that is decompressed and possibly thickened. Ascites is in this area. Discussed option of SBFT but given distal location and the pelvic location he questions if contrast will move down as well and give [place] good results.    Will plan for CT enterography over weekend if not improving. No acute surgical intervention at this time Reorient Clear diet as tolerated, but if need to adv to full liquids ok as long as no nausea given patient's intolerance of the diet.  Discussed with Abigail Wagner.  Abigail Labrum, MD [facility] [place], [place] [address] [phone] (office)     Subjective: Patient interviewed at bedside and was somnolent throughout. Was shivering during the duration of it, I placed blankets on her. Patient reports she had a bowel movement, but did not appear to fully understand the question. Patient was unable to say where she was at or what year it was. Did state her name correctly.   Objective: Vital signs in last 24 hours: Temp:  [97.9 F (36.6 C)-98.6 F (37  C)] 98.6 F (37 C) (09/01 2120) Pulse Rate:  [71-81] 81 (09/02 0209) Resp:  [16-20] 17 (09/02 0209) BP: (139-186)/(72-81) 155/73 (09/02 0209) SpO2:  [98 %-99 %] 98 % (09/02 0209) Last BM Date: 05/24/20  Intake/Output from previous day: 09/01 0701 - 09/02 0700 In: 1839.7 [P.O.:240; I.V.:1505.6; IV Piggyback:94] Out: -  Intake/Output this shift: No intake/output data recorded.  General appearance: delirious, fatigued and mild distress Resp: clear to auscultation bilaterally and normal percussion bilaterally Cardio: regular rate and rhythm and S1, S2 normal Neurologic: Mental status: Somnolent, was not alert or oriented to place and time  Lab Results:  Recent Labs    05/25/20 1111  WBC 4.9  HGB 12.9  HCT 40.2  PLT 223   BMET Recent Labs    05/25/20 1111 05/26/20 0321  NA 140 140  K 3.4* 3.2*  CL 96* 100  CO2 31 27  GLUCOSE 127* 102*  BUN 16 7*  CREATININE 0.72 0.54  CALCIUM 9.2 8.9   PT/INR No results for input(s): LABPROT, INR in the last 72 hours.  Studies/Results: CT ABDOMEN PELVIS W CONTRAST  Result Date: 05/25/2020 CLINICAL DATA:  Abdominal pain, history of ovarian and anal cancer. EXAM: CT ABDOMEN AND PELVIS WITH CONTRAST TECHNIQUE: Multidetector CT imaging of the abdomen and pelvis was performed using the standard protocol following bolus administration of intravenous contrast. CONTRAST:  156mL OMNIPAQUE IOHEXOL 300 MG/ML  SOLN COMPARISON:  CT abdomen pelvis dated 09/22/2015. FINDINGS: Lower chest: There is mild bibasilar atelectasis. Hepatobiliary: No focal liver abnormality is seen. No gallstones, gallbladder wall thickening, or biliary dilatation. Pancreas: Unremarkable. No pancreatic ductal dilatation or surrounding inflammatory changes. Spleen: Normal in size without focal abnormality. Adrenals/Urinary Tract: Adrenal glands are unremarkable. Kidneys are normal, without renal calculi, focal lesion, or hydronephrosis. Stomach/Bowel: A posterior gastric fundal  diverticulum is redemonstrated. There are multiple loops of dilated small bowel with fecalization of small bowel contents and a transition point to decompressed distal small bowel in the pelvis (series 2, image 60 2-66) most consistent with a high-grade small bowel obstruction. There is a moderate amount of free intraperitoneal fluid. There is no definite mass lesion at the site of transition to decompressed distal bowel. Vascular/Lymphatic: Aortic atherosclerosis. No enlarged abdominal or pelvic lymph nodes. Reproductive: Pelvic floor laxity and cystocele are redemonstrated. Status post hysterectomy. No adnexal masses. Other: No abdominal wall hernia. Musculoskeletal: Degenerative changes are seen in the spine. IMPRESSION: 1. Findings most consistent with a high-grade small bowel obstruction with a transition point in the pelvis. Moderate amount of free intraperitoneal fluid. No definite mass lesion at the site of transition to decompressed distal bowel. 2. Pelvic floor laxity and cystocele. Aortic Atherosclerosis (ICD10-I70.0). These results were called by telephone at the time of interpretation on 05/25/2020 at 2:19 pm to provider [facility] , who verbally acknowledged these results. Electronically Signed   By: Abigail Wagner M.D.   On: 05/25/2020 14:20    Anti-infectives: Anti-infectives (From admission, onward)   None      Assessment/Plan: Abigail Wagner is a 69 y.o. female with PMHx of anal cancer s/p chemoradiation and local treatment and ovarian cancer surgery and debulking and chemotherapy, who presents to [place] with SBO on CT but reported flatulence.  Stabilize patient mental status  LOS: 1 day  Unable to fully assess patient current state. Appears to have no concerning cardiopulm findings, latest BP reading found her to be hypertensive at 155/73. BMP and Mg levels found to be WNL. Unclear if patient is having delirium or if these are the after effects from the ativan that was administered last  night. Suggest taking an SBFT if found to have not passed a stool and consider rechecking CA-125 to assess for cancer recurrence given prior history.    Abigail Wagner 05/27/2020

## 2020-05-27 NOTE — Progress Notes (Signed)
Pt had to be placed in restraints on 05/26/2020 2245. Pt was unable to be redirected. Pt was climbing over bed rails, looking under bed for items, attempting to rip IV out of arm, biting and verbally abusive. Pt was unable to be left alone in room without bed alarm alerting. No sitter available. Diversion attempted- unsuccessful  Attempted medications to help with pt anxiety and pain without success.   Order for restraints obtained 2200. initiated at 2245. Husband terry made aware.   During time in restraints pt pulled with resistance against restraints the entire time in place. Educated pt that she could cause harm. Pt has not rested all night. Toileting provided frequently. Primary nurse had to stay in room with patient and act as sitter all night.  [phone] encouraged pt to follow rules and stop pulling at IV. Pt agreed. States "Chiquita Loth, what's happening to my brain? My mother had dementia. I think I do too."  Ambulated pt in hallway for 10-15 minutes. Brushed teeth. Walked around in room and pt then returned to bed at 0545. Pt resting comfortably with eyes closed.  Bed alarm activated. Call bell within reach.

## 2020-05-27 NOTE — Progress Notes (Signed)
PROGRESS NOTE    Abigail Wagner  STM:[id number] DOB: May 02, 1951 DOA: 05/25/2020 PCP: Susy Frizzle, MD    Chief Complaint  Patient presents with   Abdominal Pain    Brief Narrative:  As per H&P written by Dr. Denton Brick on 05/25/20 Abigail Wagner is a 69 y.o. female with medical history significant for very large anal cancer, hypertension, bipolar disorder and depression. Patient presented to the ED with complaints of abdominal bloating over the past 2 weeks.  She had one episode of vomiting about 4 days ago.  Initially had loose stools, but over the past several days she has become constipated.  Her last bowel movement was yesterday, it was a small amount and it was hard.  Patient has gotten both doses of her Covid vaccine.  ED Course: Vitals stable.  Potassium 3.4 otherwise unremarkable CBC BMP.  UA not suggestive of, ketones present.  Contrast shows a high-grade small bowel obstruction with a transition point in the pelvis.  Moderate amount of free intraperitoneal fluid.  No mass at the site of transition. EDP talked to general surgeon Dr. Constance Haw, recommended admission, NG tube.  N.p.o. will see patient.  Assessment & Plan: 1-SBO (small bowel obstruction) (HCC) -With concern for adhesions or return of colon cancer -Patient with debulk surgery for ovarian cancer and anal cancer in the past -High-grade transition obstruction appreciated on CT scan. -Reports no abdominal pain, no further nausea vomiting and is passing gas. -Will advance to full liquid. -If needed plan is for CT enterography over the weekend -Continue holding on NG tube for now -Appreciate assistance and recommendations by general surgery.  2-Depression/anxiety/Bipolar 1 disorder ([place]) -Continue as needed Ativan -Resume the use of Depakote and Lexapro  3-History of ovarian cancer/anal cancer -Continue outpatient follow-up surveillance with oncology service.  4-hypertension -Continue as needed  hydralazine -Blood pressure stable  5-hypokalemia/hypomagnesemia -In the setting of poor oral intake and GI losses prior to admission (vomiting) -Continue electrolyte repletion and fluid resuscitation. -Follow trend in the morning  6-nausea/vomiting -In the setting of problem #1 -Appears to be resolving -Continue to follow symptomatic improvement with current intervention.   DVT prophylaxis: Heparin Code Status: Full code Family Communication: Husband updated at bedside. Disposition:   Status is: Inpatient  Dispo: The patient is from: Home              Anticipated d/c is to: Home              Anticipated d/c date is: 1-2 days              Patient currently is not medically ready for discharge; still having some abdominal distention;  and not able to demonstrate good tolerance of oral intake to maintain adequate hydration and nutrition.  Will continue slowly advancing her diet and assess response.  Continue to follow electrolytes and further replete as needed.  Follow general surgery recommendations.       Consultants:   General surgery   Procedures:  See below for x-ray reports.   Antimicrobials:  None   Subjective: No fever, no chest pain, no shortness of breath, no nausea or vomiting.  Patient also reports no abdominal pain.  Overnight with mild delirium and some confusion.  Feeling better this morning.  Still no bowel movement.  Patient expressed passing gas.  Objective: Vitals:   05/26/20 1500 05/26/20 1821 05/26/20 2120 05/27/20 0209  BP:  (!) 186/72 139/73 (!) 155/73  Pulse:  71 81 81  Resp:  16 20 17   Temp:  97.9 F (36.6 C) 98.6 F (37 C)   TempSrc:  Oral    SpO2:  98% 98% 98%  Weight:      Height: 5\' 5"  (1.651 m)       Intake/Output Summary (Last 24 hours) at 05/27/2020 1754 Last data filed at 05/27/2020 0300 Gross per 24 hour  Intake 769.39 ml  Output --  Net 769.39 ml   Filed Weights   05/25/20 0953  Weight: 62.1 kg     Examination: General exam: Alert, awake, oriented x 2 during my examination; according to husband at bedside patient mentation was improved in comparison to admission state.  Reports no nausea, no vomiting, no abdominal pain.  Per nursing staff no bowel movements overnight; positive mucus passage. Respiratory system: Clear to auscultation bilaterally; no requiring oxygen supplementation no using accessory muscle. Cardiovascular system:RRR. No murmurs, rubs, gallops. Gastrointestinal system: Abdomen is mildly distended; soft, positive bowel sounds; no guarding. Central nervous system: Alert and oriented. No focal neurological deficits. Extremities: No C/C/E, +pedal pulses Skin: No rashes, lesions or ulcers Psychiatry: Judgement and insight appear normal. Mood & affect appropriate.    Data Reviewed: I have personally reviewed following labs and imaging studies  CBC: Recent Labs  Lab 05/25/20 1111  WBC 4.9  NEUTROABS 3.6  HGB 12.9  HCT 40.2  MCV 97.3  PLT 240    Basic Metabolic Panel: Recent Labs  Lab 05/25/20 1111 05/26/20 0321 05/27/20 0727  NA 140 140 137  K 3.4* 3.2* 4.0  CL 96* 100 102  CO2 31 27 25   GLUCOSE 127* 102* 104*  BUN 16 7* 6*  CREATININE 0.72 0.54 0.50  CALCIUM 9.2 8.9 9.1  MG 2.3  --  2.1    GFR: Estimated Creatinine Clearance: 59.7 mL/min (by C-G formula based on SCr of 0.5 mg/dL).  Liver Function Tests: Recent Labs  Lab 05/25/20 1111  AST 21  ALT 14  ALKPHOS 58  BILITOT 0.3  PROT 7.2  ALBUMIN 3.9    CBG: No results for input(s): GLUCAP in the last 168 hours.   Recent Results (from the past 240 hour(s))  SARS Coronavirus 2 by RT PCR (hospital order, performed in [facility] hospital lab) Nasopharyngeal Nasopharyngeal Swab     Status: None   Collection Time: 05/25/20  3:16 PM   Specimen: Nasopharyngeal Swab  Result Value Ref Range Status   SARS Coronavirus 2 NEGATIVE NEGATIVE Final    Comment: (NOTE) SARS-CoV-2 target nucleic  acids are NOT DETECTED.  The SARS-CoV-2 RNA is generally detectable in upper and lower respiratory specimens during the acute phase of infection. The lowest concentration of SARS-CoV-2 viral copies this assay can detect is 250 copies / mL. A negative result does not preclude SARS-CoV-2 infection and should not be used as the sole basis for treatment or other patient management decisions.  A negative result may occur with improper specimen collection / handling, submission of specimen other than nasopharyngeal swab, presence of viral mutation(s) within the areas targeted by this assay, and inadequate number of viral copies (<250 copies / mL). A negative result must be combined with clinical observations, patient history, and epidemiological information.  Fact Sheet for Patients:   StrictlyIdeas.no  Fact Sheet for Healthcare Providers: BankingDealers.co.za  This test is not yet approved or  cleared by the [place] FDA and has been authorized for detection and/or diagnosis of SARS-CoV-2 by FDA under an Emergency Use Authorization (EUA).  This EUA will remain  in effect (meaning this test can be used) for the duration of the COVID-19 declaration under Section 564(b)(1) of the Act, 21 U.S.C. section 360bbb-3(b)(1), unless the authorization is terminated or revoked sooner.  Performed at [facility], [address], [place], [place] [address]      Radiology Studies: No results found.   Scheduled Meds:  Chlorhexidine Gluconate Cloth  6 each Topical Daily   divalproex  500 mg Oral Daily   escitalopram  20 mg Oral BID   heparin  5,000 Units Subcutaneous Q8H   pantoprazole  40 mg Oral Daily   Continuous Infusions:  dextrose 5 % and 0.9 % NaCl with KCl 40 mEq/L 75 mL/hr at 05/27/20 1752     LOS: 1 day    Time spent: 30 minutes    Barton Dubois, MD [facility]   To contact the attending provider between 7A-7P  or the covering provider during after hours 7P-7A, please log into the web site [email or web address] and access using universal [place] password for that web site. If you do not have the password, please call the hospital operator.  05/27/2020, 5:54 PM

## 2020-05-27 NOTE — Plan of Care (Signed)

## 2020-05-28 LAB — CBC
HCT: 37.6 % (ref 36.0–46.0)
Hemoglobin: 12.2 g/dL (ref 12.0–15.0)
MCH: 31.4 pg (ref 26.0–34.0)
MCHC: 32.4 g/dL (ref 30.0–36.0)
MCV: 96.7 fL (ref 80.0–100.0)
Platelets: 241 10*3/uL (ref 150–400)
RBC: 3.89 MIL/uL (ref 3.87–5.11)
RDW: 14.6 % (ref 11.5–15.5)
WBC: 3.1 10*3/uL — ABNORMAL LOW (ref 4.0–10.5)
nRBC: 0 % (ref 0.0–0.2)

## 2020-05-28 LAB — BASIC METABOLIC PANEL
Anion gap: 11 (ref 5–15)
BUN: 11 mg/dL (ref 8–23)
CO2: 23 mmol/L (ref 22–32)
Calcium: 8.8 mg/dL — ABNORMAL LOW (ref 8.9–10.3)
Chloride: 104 mmol/L (ref 98–111)
Creatinine, Ser: 0.54 mg/dL (ref 0.44–1.00)
GFR calc Af Amer: >60 mL/min (ref >60)
GFR calc non Af Amer: >60 mL/min (ref >60)
Glucose, Bld: 119 mg/dL — ABNORMAL HIGH (ref 70–99)
Potassium: 3.5 mmol/L (ref 3.5–5.1)
Sodium: 138 mmol/L (ref 135–145)

## 2020-05-28 LAB — MAGNESIUM: Magnesium: 2 mg/dL (ref 1.7–2.4)

## 2020-05-28 MED ORDER — POLYETHYLENE GLYCOL 3350 17 G PO PACK: 17.0000 g | PACK | Freq: Every day | ORAL | 1 refills | Status: DC

## 2020-05-28 MED ORDER — DOCUSATE SODIUM 100 MG PO CAPS: 100.0000 mg | ORAL_CAPSULE | Freq: Every day | ORAL | 2 refills | Status: DC | PRN

## 2020-05-28 NOTE — Plan of Care (Signed)

## 2020-05-28 NOTE — Discharge Summary (Signed)
Physician Discharge Summary  [name] SVX:[id number] DOB: 1951/02/16 DOA: 05/25/2020  PCP: Susy Frizzle, MD  Admit date: 05/25/2020 Discharge date: 05/28/2020  Time spent: 35 minutes  Recommendations for Outpatient Follow-up:  1. Repeat basic metabolic panel and magnesium level to follow electrolytes trend and stability. 2. Reassess blood pressure and adjust antihypertensive regimen as needed   Discharge Diagnoses:  Principal Problem:   SBO (small bowel obstruction) ([place]) Active Problems:   Depression   Bipolar 1 disorder (HCC)   History of ovarian cancer   Anal cancer ([place])   Essential hypertension   Discharge Condition: Stable and improved.  Discharged home with instruction to follow-up with PCP in 10 days and with general surgery in about 2 weeks.  CODE STATUS: Full code  Diet recommendation: Heart healthy/soft diet.  Filed Weights   05/25/20 0953  Weight: 62.1 kg    History of present illness:  As per H&P written by Dr. Denton Brick on 05/25/20 Abigail Wagner a 69 y.o.femalewith medical history significant forvery large anal cancer, hypertension, bipolar disorder and depression. Patient presented to the ED with complaints of abdominal bloating over the past 2 weeks. She had one episode of vomiting about 4 days ago. Initially had loose stools, but over the past several days she has become constipated. Her last bowel movement was yesterday, it was a small amount and it was hard.  Patient has gotten both doses of her Covid vaccine.  ED Course:Vitals stable. Potassium 3.4 otherwise unremarkable CBC BMP. UA not suggestive of, ketones present. Contrast shows a high-grade small bowel obstruction with a transition point in the pelvis. Moderate amount of free intraperitoneal fluid. No mass at the site of transition. EDPtalked to general surgeon Dr.Bridges,recommended admission, NG tube. N.p.o. will see patient.  Hospital Course:  1-SBO (small bowel  obstruction) (HCC) -With concern for adhesions or return of colon cancer -Patient with debulk surgery for ovarian cancer and anal cancer in the past -High-grade transition obstruction appreciated on CT scan on admission. -Reports no abdominal pain, no further nausea or vomiting and is passing gas/moving her bowels.. -Discussed with general surgery who has recommended to advance to soft diet and let patient go home with follow-up in 2 weeks for CT enterography as an outpatient. Appreciate their assistance and recommendations. -Patient has been discharge on MiraLAX and as needed Colace.  2-Depression/anxiety/Bipolar 1 disorder ([place]) -Continue as needed Ativan -Resume the use of Depakote and Lexapro  3-History of ovarian cancer/anal cancer -Continue outpatient follow-up surveillance with oncology service.  4-hypertension -Blood pressure stable and rising -Will discharge him back on losartan and instructed to follow heart healthy diet.  5-hypokalemia/hypomagnesemia -In the setting of poor oral intake and GI losses prior to admission (vomiting) -Repleted. -Check a basic metabolic panel and magnesium level at follow-up visit to reassess electrolytes stability  6-nausea/vomiting -In the setting of problem #1 -resolved  Procedures:  See below for x-ray reports  Consultations:  General surgery  Discharge Exam: Vitals:   05/27/20 2132 05/28/20 0537  BP: (!) 165/60 121/62  Pulse: 83 83  Resp: 17 16  Temp: 99.2 F (37.3 C)   SpO2: 95% 94%    General: No fever, no chest pain, no nausea, no vomiting, no abdominal pain.  Reports passing gas and had multiple bowel movements overnight.  Tolerating full liquid diet without troubles. Cardiovascular: S1-S2, no rubs, no gallops, no JVD. Respiratory: Clear to auscultation bilaterally; no using accessory muscle. Abdomen: Soft, nontender, nondistended, positive bowel sounds. Extremities: No cyanosis, no  clubbing.  No  edema.  Discharge Instructions   Discharge Instructions    Diet - low sodium heart healthy   Complete by: As directed    Discharge instructions   Complete by: As directed    -Follow soft diet -Maintain adequate hydration -Follow-up with general surgery in 2 weeks -Arrange follow-up with PCP in 10 days.   Increase activity slowly   Complete by: As directed      Allergies as of 05/28/2020   No Known Allergies     Medication List    STOP taking these medications   HYDROcodone-acetaminophen 5-325 MG tablet Commonly known as: Norco   meloxicam 15 MG tablet Commonly known as: MOBIC     TAKE these medications   albuterol 108 (90 Base) MCG/ACT inhaler Commonly known as: VENTOLIN HFA Inhale 2 puffs into the lungs every 6 (six) hours as needed for wheezing or shortness of breath.   b complex vitamins tablet Take 1 tablet by mouth daily.   BC Headache 325-95-16 MG Tabs Generic drug: Aspirin-Salicylamide-Caffeine Take 1 packet by mouth daily as needed (for pain).   benzonatate 100 MG capsule Commonly known as: Tessalon Perles Take 1 capsule (100 mg total) by mouth 3 (three) times daily as needed.   Co Q 10 10 MG Caps Take by mouth daily.   divalproex 500 MG 24 hr tablet Commonly known as: Depakote ER Take 1 tablet (500 mg total) by mouth daily.   docusate sodium 100 MG capsule Commonly known as: Colace Take 1 capsule (100 mg total) by mouth daily as needed for moderate constipation.   escitalopram 20 MG tablet Commonly known as: LEXAPRO Take 1 tablet (20 mg total) by mouth 2 (two) times daily.   KRILL OIL PO Take 350 mg by mouth daily.   LORazepam 1 MG tablet Commonly known as: ATIVAN Take 1 tablet (1 mg total) by mouth 3 (three) times daily as needed for anxiety.   losartan 50 MG tablet Commonly known as: COZAAR TAKE 1 TABLET BY MOUTH EVERY DAY   multivitamin tablet Take 1 tablet by mouth daily.   omeprazole 40 MG capsule Commonly known as:  PRILOSEC Take 1 capsule (40 mg total) by mouth daily.   polyethylene glycol 17 g packet Commonly known as: MIRALAX / GLYCOLAX Take 17 g by mouth daily.   rosuvastatin 20 MG tablet Commonly known as: CRESTOR Take 1 tablet (20 mg total) by mouth daily.      No Known Allergies  Follow-up Information    Virl Cagey, MD Follow up on 06/10/2020.   Specialty: General Surgery Why: follow up after SBO Contact information: [address] [place] [facility] [address] [phone]        Susy Frizzle, MD. Schedule an appointment as soon as possible for a visit in 10 day(s).   Specialty: Family Medicine Contact information: [address] [place] [address] [phone]               The results of significant diagnostics from this hospitalization (including imaging, microbiology, ancillary and laboratory) are listed below for reference.    Significant Diagnostic Studies: CT ABDOMEN PELVIS W CONTRAST  Result Date: 05/25/2020 CLINICAL DATA:  Abdominal pain, history of ovarian and anal cancer. EXAM: CT ABDOMEN AND PELVIS WITH CONTRAST TECHNIQUE: Multidetector CT imaging of the abdomen and pelvis was performed using the standard protocol following bolus administration of intravenous contrast. CONTRAST:  142mL OMNIPAQUE IOHEXOL 300 MG/ML  SOLN COMPARISON:  CT abdomen pelvis  dated 09/22/2015. FINDINGS: Lower chest: There is mild bibasilar atelectasis. Hepatobiliary: No focal liver abnormality is seen. No gallstones, gallbladder wall thickening, or biliary dilatation. Pancreas: Unremarkable. No pancreatic ductal dilatation or surrounding inflammatory changes. Spleen: Normal in size without focal abnormality. Adrenals/Urinary Tract: Adrenal glands are unremarkable. Kidneys are normal, without renal calculi, focal lesion, or hydronephrosis. Stomach/Bowel: A posterior gastric fundal diverticulum is redemonstrated. There are multiple loops of dilated small bowel with  fecalization of small bowel contents and a transition point to decompressed distal small bowel in the pelvis (series 2, image 60 2-66) most consistent with a high-grade small bowel obstruction. There is a moderate amount of free intraperitoneal fluid. There is no definite mass lesion at the site of transition to decompressed distal bowel. Vascular/Lymphatic: Aortic atherosclerosis. No enlarged abdominal or pelvic lymph nodes. Reproductive: Pelvic floor laxity and cystocele are redemonstrated. Status post hysterectomy. No adnexal masses. Other: No abdominal wall hernia. Musculoskeletal: Degenerative changes are seen in the spine. IMPRESSION: 1. Findings most consistent with a high-grade small bowel obstruction with a transition point in the pelvis. Moderate amount of free intraperitoneal fluid. No definite mass lesion at the site of transition to decompressed distal bowel. 2. Pelvic floor laxity and cystocele. Aortic Atherosclerosis (ICD10-I70.0). These results were called by telephone at the time of interpretation on 05/25/2020 at 2:19 pm to provider [facility] , who verbally acknowledged these results. Electronically Signed   By: Zerita Boers M.D.   On: 05/25/2020 14:20    Microbiology: Recent Results (from the past 240 hour(s))  SARS Coronavirus 2 by RT PCR (hospital order, performed in [facility] hospital lab) Nasopharyngeal Nasopharyngeal Swab     Status: None   Collection Time: 05/25/20  3:16 PM   Specimen: Nasopharyngeal Swab  Result Value Ref Range Status   SARS Coronavirus 2 NEGATIVE NEGATIVE Final    Comment: (NOTE) SARS-CoV-2 target nucleic acids are NOT DETECTED.  The SARS-CoV-2 RNA is generally detectable in upper and lower respiratory specimens during the acute phase of infection. The lowest concentration of SARS-CoV-2 viral copies this assay can detect is 250 copies / mL. A negative result does not preclude SARS-CoV-2 infection and should not be used as the sole basis for treatment  or other patient management decisions.  A negative result may occur with improper specimen collection / handling, submission of specimen other than nasopharyngeal swab, presence of viral mutation(s) within the areas targeted by this assay, and inadequate number of viral copies (<250 copies / mL). A negative result must be combined with clinical observations, patient history, and epidemiological information.  Fact Sheet for Patients:   StrictlyIdeas.no  Fact Sheet for Healthcare Providers: BankingDealers.co.za  This test is not yet approved or  cleared by the [place] FDA and has been authorized for detection and/or diagnosis of SARS-CoV-2 by FDA under an Emergency Use Authorization (EUA).  This EUA will remain in effect (meaning this test can be used) for the duration of the COVID-19 declaration under Section 564(b)(1) of the Act, 21 U.S.C. section 360bbb-3(b)(1), unless the authorization is terminated or revoked sooner.  Performed at [facility], [address], [place], [place] [address]      Labs: Basic Metabolic Panel: Recent Labs  Lab 05/25/20 1111 05/26/20 0321 05/27/20 0727 05/28/20 0901  NA 140 140 137 138  K 3.4* 3.2* 4.0 3.5  CL 96* 100 102 104  CO2 31 27 25 23   GLUCOSE 127* 102* 104* 119*  BUN 16 7* 6* 11  CREATININE 0.72 0.54  0.50 0.54  CALCIUM 9.2 8.9 9.1 8.8*  MG 2.3  --  2.1 2.0   Liver Function Tests: Recent Labs  Lab 05/25/20 1111  AST 21  ALT 14  ALKPHOS 58  BILITOT 0.3  PROT 7.2  ALBUMIN 3.9   CBC: Recent Labs  Lab 05/25/20 1111 05/28/20 0901  WBC 4.9 3.1*  NEUTROABS 3.6  --   HGB 12.9 12.2  HCT 40.2 37.6  MCV 97.3 96.7  PLT 223 241    Signed:  Barton Dubois MD.  [facility] 05/28/2020, 2:21 PM

## 2020-05-28 NOTE — Care Management Important Message (Signed)
Important Message  Patient Details  Name: Abigail Wagner MRN: [id number] Date of Birth: 12/30/1950   Medicare Important Message Given:  Yes     Tommy Medal 05/28/2020, 1:59 PM

## 2020-05-28 NOTE — Progress Notes (Signed)
Nsg Discharge Note  Admit Date:  05/25/2020 Discharge date: 05/28/2020   Abigail Wagner to be D/C'd home per MD order.  AVS completed.  Copy for chart, and copy for patient signed, and dated. Patient/caregiver able to verbalize understanding.  Discharge Medication: Allergies as of 05/28/2020   No Known Allergies     Medication List    STOP taking these medications   HYDROcodone-acetaminophen 5-325 MG tablet Commonly known as: Norco   meloxicam 15 MG tablet Commonly known as: MOBIC     TAKE these medications   albuterol 108 (90 Base) MCG/ACT inhaler Commonly known as: VENTOLIN HFA Inhale 2 puffs into the lungs every 6 (six) hours as needed for wheezing or shortness of breath.   b complex vitamins tablet Take 1 tablet by mouth daily.   BC Headache 325-95-16 MG Tabs Generic drug: Aspirin-Salicylamide-Caffeine Take 1 packet by mouth daily as needed (for pain).   benzonatate 100 MG capsule Commonly known as: Tessalon Perles Take 1 capsule (100 mg total) by mouth 3 (three) times daily as needed.   Co Q 10 10 MG Caps Take by mouth daily.   divalproex 500 MG 24 hr tablet Commonly known as: Depakote ER Take 1 tablet (500 mg total) by mouth daily.   docusate sodium 100 MG capsule Commonly known as: Colace Take 1 capsule (100 mg total) by mouth daily as needed for moderate constipation.   escitalopram 20 MG tablet Commonly known as: LEXAPRO Take 1 tablet (20 mg total) by mouth 2 (two) times daily.   KRILL OIL PO Take 350 mg by mouth daily.   LORazepam 1 MG tablet Commonly known as: ATIVAN Take 1 tablet (1 mg total) by mouth 3 (three) times daily as needed for anxiety.   losartan 50 MG tablet Commonly known as: COZAAR TAKE 1 TABLET BY MOUTH EVERY DAY   multivitamin tablet Take 1 tablet by mouth daily.   omeprazole 40 MG capsule Commonly known as: PRILOSEC Take 1 capsule (40 mg total) by mouth daily.   polyethylene glycol 17 g packet Commonly known as: MIRALAX /  GLYCOLAX Take 17 g by mouth daily.   rosuvastatin 20 MG tablet Commonly known as: CRESTOR Take 1 tablet (20 mg total) by mouth daily.       Discharge Assessment: Vitals:   05/27/20 2132 05/28/20 0537  BP: (!) 165/60 121/62  Pulse: 83 83  Resp: 17 16  Temp: 99.2 F (37.3 C)   SpO2: 95% 94%   Skin clean, dry and intact without evidence of skin break down, no evidence of skin tears noted. IV catheter discontinued intact. Site without signs and symptoms of complications - no redness or edema noted at insertion site, patient denies c/o pain - only slight tenderness at site.  Dressing with slight pressure applied.  D/c Instructions-Education: Discharge instructions given to patient/family with verbalized understanding. D/c education completed with patient/family including follow up instructions, medication list, d/c activities limitations if indicated, with other d/c instructions as indicated by MD - patient able to verbalize understanding, all questions fully answered. Patient instructed to return to ED, call 911, or call MD for any changes in condition.  Patient escorted via [place], and D/C home via private auto.  Zachery Conch, RN 05/28/2020 6:39 PM

## 2020-05-28 NOTE — Progress Notes (Addendum)
   I was present with the medical student for this service. I personally verified the history of present illness, performed the physical exam, and made the plan for this encounter. I have verified the medical student's documentation and made modifications where appropriately. I have personally documented in my own words a brief history, physical, and plan below.     Doing well and having multiple Bms. Tolerating full liquids. Feeling better and no pain.  Will plan to see as outpatient and do CT enterography given her history.  Curlene Labrum, MD [facility] [place], [place] [address] [phone] (office)    Subjective: Patient interviewed at bedside and mental status has much improved. States today that she has had good UOP. Reports minor pain diffusely in her abdomen. Has not been able to ambulate around the room. Had soft foods this AM, states that she feels like her appetite has decreased, she was able to eat some food but reports it was very bland. Reports having had many BMs overnight and this morning, unfortunately she says that she has been unable to control them. Had many accidents this AM. Denies N/V, fevers, chills, CP, or SOB.   Objective: Vital signs in last 24 hours: Temp:  [99.2 F (37.3 C)] 99.2 F (37.3 C) (09/02 2132) Pulse Rate:  [83] 83 (09/03 0537) Resp:  [16-17] 16 (09/03 0537) BP: (121-165)/(60-62) 121/62 (09/03 0537) SpO2:  [93 %-95 %] 94 % (09/03 0537) Last BM Date: 05/24/20  Intake/Output from previous day: 09/02 0701 - 09/03 0700 In: 240 [P.O.:240] Out: -  Intake/Output this shift: No intake/output data recorded.  General appearance: alert, cooperative, fatigued and no distress Resp: clear to auscultation bilaterally Cardio: regular rate and rhythm, S1, S2 normal, no murmur, click, rub or gallop GI: normal findings: bowel sounds normal, no bruits heard and no masses palpable and abnormal findings:   diffuse tenderness on deep palpation  Psych: A&Ox4  Lab Results:  Recent Labs    05/25/20 1111  WBC 4.9  HGB 12.9  HCT 40.2  PLT 223   BMET Recent Labs    05/26/20 0321 05/27/20 0727  NA 140 137  K 3.2* 4.0  CL 100 102  CO2 27 25  GLUCOSE 102* 104*  BUN 7* 6*  CREATININE 0.54 0.50  CALCIUM 8.9 9.1   PT/INR No results for input(s): LABPROT, INR in the last 72 hours.  Studies/Results: No results found.  Anti-infectives: Anti-infectives (From admission, onward)   None      Assessment/Plan: s/p  Advance diet  LOS: 2 days   SBO. Patient found to be delirious yesterday, today mental status has much improved. CT yesterday was concerning for a SBO. Pelvic SB with fecalization down to area that is compressed and potentially thickened with ascites. SBO appears to have resolved clinically. Unclear etiology of SBO, adhesions vs. colon cancer. Has had many BMs overnights and this morning, total >10 BMs. Do not anticipate surgical intervention is needed at this time. Can advance diet as able to tolerate. Plan: --Advance diet --Awaiting CBC & BMP   [name] 05/28/2020

## 2020-05-31 ENCOUNTER — Other Ambulatory Visit (HOSPITAL_COMMUNITY): Payer: Self-pay | Admitting: Family Medicine

## 2020-05-31 DIAGNOSIS — Z1231 Encounter for screening mammogram for malignant neoplasm of breast: Secondary | ICD-10-CM

## 2020-06-08 ENCOUNTER — Other Ambulatory Visit: Payer: Self-pay | Admitting: Family

## 2020-06-08 ENCOUNTER — Encounter: Payer: Self-pay | Admitting: Family Medicine

## 2020-06-08 ENCOUNTER — Other Ambulatory Visit: Payer: Self-pay

## 2020-06-08 ENCOUNTER — Ambulatory Visit (INDEPENDENT_AMBULATORY_CARE_PROVIDER_SITE_OTHER): Payer: Medicare Other | Admitting: Family Medicine

## 2020-06-08 VITALS — BP 108/68 | HR 93 | Resp 16 | Ht 65.5 in | Wt 121.0 lb

## 2020-06-08 DIAGNOSIS — K56609 Unspecified intestinal obstruction, unspecified as to partial versus complete obstruction: Secondary | ICD-10-CM

## 2020-06-08 NOTE — Progress Notes (Signed)
Subjective:    Patient ID: Abigail Wagner, female    DOB: 1951/04/19, 69 y.o.   MRN: [id number]  HPI   Patient is a very pleasant 69 year old Caucasian female who has a very complicated past medical history who has a history of stage III ovarian cancer with metastasis to the omentum.  This was surgically resected with a bilateral salpingo-oophorectomy.  Patient had previously had a partial hysterectomy.  She also has a history of stage II anal cancer.  Patient was recently admitted to the hospital with a bowel obstruction.  I have copied relevant portions of her discharge summary below: Admit date: 05/25/2020 Discharge date: 05/28/2020  Time spent: 35 minutes  Recommendations for Outpatient Follow-up:  1. Repeat basic metabolic panel and magnesium level to follow electrolytes trend and stability. 2. Reassess blood pressure and adjust antihypertensive regimen as needed   Discharge Diagnoses:  Principal Problem:   SBO (small bowel obstruction) ([place]) Active Problems:   Depression   Bipolar 1 disorder (HCC)   History of ovarian cancer   Anal cancer ([place])   Essential hypertension   Discharge Condition: Stable and improved.  Discharged home with instruction to follow-up with PCP in 10 days and with general surgery in about 2 weeks.  CODE STATUS: Full code  Diet recommendation: Heart healthy/soft diet.     Filed Weights   05/25/20 0953  Weight: 62.1 kg    History of present illness:  As per H&P written by Abigail Wagner on 05/25/20 Abigail Wagner a 69 y.o.femalewith medical history significant forvery large anal cancer, hypertension, bipolar disorder and depression. Patient presented to the ED with complaints of abdominal bloating over the past 2 weeks. She had one episode of vomiting about 4 days ago. Initially had loose stools, but over the past several days she has become constipated. Her last bowel movement was yesterday, it was a small amount and it was  hard.  Patient has gotten both doses of her Covid vaccine.  ED Course:Vitals stable. Potassium 3.4 otherwise unremarkable CBC BMP. UA not suggestive of, ketones present. Contrast shows a high-grade small bowel obstruction with a transition point in the pelvis. Moderate amount of free intraperitoneal fluid. No mass at the site of transition. EDPtalked to general surgeon Abigail Wagner. N.p.o. will see patient.  Hospital Course:  1-SBO (small bowel obstruction) (HCC) -With concern for adhesions or return of colon cancer -Patient with debulk surgery for ovarian cancer and anal cancer in the past -High-grade transition obstruction appreciated on CT scan on admission. -Reportsno abdominalpain, no further nausea or vomiting and is passing gas/moving her bowels.. -Discussed with general surgery who has recommended to advance to soft diet and let patient go home with follow-up in 2 weeks for CT enterography as an outpatient. Appreciate their assistance and recommendations. -Patient has been discharge on MiraLAX and as needed Colace.  2-Depression/anxiety/Bipolar 1 disorder ([place]) -Continue as needed Ativan -Resume the use of Depakote and Lexapro  3-History of ovarian cancer/anal cancer -Continue outpatient follow-up surveillance with oncology service.  4-hypertension -Blood pressure stable and rising -Will discharge him back on losartan and instructed to follow heart healthy diet.  5-hypokalemia/hypomagnesemia -In the setting of poor oral intake and GI losses prior to admission (vomiting) -Repleted. -Check a basic metabolic panel and magnesium level at follow-up visit to reassess electrolytes stability  6-nausea/vomiting -In the setting of problem #1 -resolved   06/08/20 She is here today for follow-up.  Overall she seems to be doing much better.  She denies any pain in her abdomen.  She reports daily flatus.  She also is having daily bowel  movements.  She denies any melena or hematochezia.  General surgery had recommended a CT enterography as an outpatient.  She has an appointment to see Dr. Constance Haw on September 16.  She denies any further nausea or vomiting.  She is tolerating food without any difficulty.  She does report fatigue and she is slowly regaining her strength.  She states that when she had morphine in the hospital, she was delirious and had to be placed in restraints.  She states that that is the second time she is had that reaction to morphine.  Therefore she would like me to list this as an allergy.  That is certainly out of character for this patient who is normally extremely sweet and polite and never shows evidence of confusion or delirium.  The remainder of her vital signs today are normal.  Past Surgical History:  Procedure Laterality Date  . BREAST ENHANCEMENT SURGERY  1986  . [place]  . COLONOSCOPY N/A 10/07/2015   Procedure: COLONOSCOPY;  Surgeon: Rogene Houston, MD;  [facility];  Service: Endoscopy;  Laterality: N/A;  7:30  . EXPLORATORY LAPAROTOMY/ OMENTECTOMY/  BILATERAL SALPINGOOPHORECTOMY/  [facility] PLACEMENT  07-03-2014   [facility]  . KNEE ARTHROSCOPY Left 09-22-2004  . LAPAROSCOPY N/A 06/02/2014   Procedure: DIAGNOSTIC LAPAROSCOPY, OMENTAL BIOPSY, RIGHT OVARY BIOPSY, LYSIS OF ADHESIONS;  Surgeon: Fanny Skates, MD;  Location: [place];  Service: General;  Laterality: N/A;  . MUCOSAL ADVANCEMENT FLAP N/A 06/15/2016   Procedure: EXCISION RECTOVAGINAOL FISTULA WITH MUCOSAL ADVANCEMENT FLAP;  Surgeon: Leighton Ruff, MD;  Location: [place];  Service: General;  Laterality: N/A;  . PLACEMENT OF SETON N/A 09/09/2015   Procedure: PLACEMENT OF SETON;  Surgeon: Leighton Ruff, MD;  Location: [place];  Service: General;  Laterality: N/A;  . PORT-A-CATH REMOVAL Right 08/17/2014   Procedure: REMOVAL INTRAPERITONEAL CHEMO PORT;  Surgeon: Fanny Skates, MD;   Location: [place];  Service: General;  Laterality: Right;  . PORTACATH PLACEMENT Right 08/17/2014   Procedure:  PLACE NEW PORT A CATH;  Surgeon: Fanny Skates, MD;  Location: [place];  Service: General;  Laterality: Right;  . RECTAL BIOPSY N/A 09/09/2015   Procedure: BIOPSY OF RECTOVAGINAL MASS;  Surgeon: Leighton Ruff, MD;  Location: [place];  Service: General;  Laterality: N/A;  . TUBAL LIGATION  YRS AGO  . VAGINAL HYSTERECTOMY  1981   fibroids   Current Outpatient Medications on File Prior to Visit  Medication Sig Dispense Refill  . albuterol (VENTOLIN HFA) 108 (90 Base) MCG/ACT inhaler Inhale 2 puffs into the lungs every 6 (six) hours as needed for wheezing or shortness of breath. 8 g 0  . Aspirin-Salicylamide-Caffeine (BC HEADACHE) 325-95-16 MG TABS Take 1 packet by mouth daily as needed (for pain).    Marland Kitchen b complex vitamins tablet Take 1 tablet by mouth daily.    . benzonatate (TESSALON PERLES) 100 MG capsule Take 1 capsule (100 mg total) by mouth 3 (three) times daily as needed. 20 capsule 0  . Coenzyme Q10 (CO Q 10) 10 MG CAPS Take by mouth daily.    . divalproex (DEPAKOTE ER) 500 MG 24 hr tablet Take 1 tablet (500 mg total) by mouth daily. 90 tablet 3  . docusate sodium (COLACE) 100 MG capsule Take 1 capsule (100 mg total) by mouth  daily as needed for moderate constipation. 30 capsule 2  . escitalopram (LEXAPRO) 20 MG tablet Take 1 tablet (20 mg total) by mouth 2 (two) times daily. 180 tablet 2  . KRILL OIL PO Take 350 mg by mouth daily.    Marland Kitchen LORazepam (ATIVAN) 1 MG tablet Take 1 tablet (1 mg total) by mouth 3 (three) times daily as needed for anxiety. 90 tablet 5  . losartan (COZAAR) 50 MG tablet TAKE 1 TABLET BY MOUTH EVERY DAY (Patient taking differently: Take 50 mg by mouth daily. ) 90 tablet 3  . Multiple Vitamin (MULTIVITAMIN) tablet Take 1 tablet by mouth daily.    Marland Kitchen omeprazole (PRILOSEC) 40 MG capsule Take 1 capsule (40 mg  total) by mouth daily. 30 capsule 3  . polyethylene glycol (MIRALAX / GLYCOLAX) 17 g packet Take 17 g by mouth daily. 30 each 1  . rosuvastatin (CRESTOR) 20 MG tablet Take 1 tablet (20 mg total) by mouth daily. 90 tablet 1   No current facility-administered medications on file prior to visit.   Allergies  Allergen Reactions  . Morphine And Related     Delirium with sbo   Social History   Socioeconomic History  . Marital status: Married    Spouse name: Abigail Wagner  . Number of children: 3  . Years of education: 51  . Highest education level: Not on file  Occupational History  . Occupation: retire    Comment: [employer]  Tobacco Use  . Smoking status: Former Smoker    Packs/day: 0.50    Years: 43.00    Pack years: 21.50    Types: Cigarettes  . Smokeless tobacco: Never Used  Vaping Use  . Vaping Use: Never used  Substance and Sexual Activity  . Alcohol use: No  . Drug use: No  . Sexual activity: Not Currently    Birth control/protection: Surgical    Comment: hyst  Other Topics Concern  . Not on file  Social History Narrative   Lives at home with Abigail Wagner   Retired [place] Strain:   . Difficulty of Paying Living Expenses: Not on file  Food Insecurity:   . Worried About Charity fundraiser in the Last Year: Not on file  . Ran Out of Food in the Last Year: Not on file  Transportation Needs:   . Lack of Transportation (Medical): Not on file  . Lack of Transportation (Non-Medical): Not on file  Physical Activity:   . Days of Exercise per Week: Not on file  . Minutes of Exercise per Session: Not on file  Stress:   . Feeling of Stress : Not on file  Social Connections:   . Frequency of Communication with Friends and Family: Not on file  . Frequency of Social Gatherings with Friends and Family: Not on file  . Attends Religious Services: Not on file  . Active Member of Clubs or Organizations: Not on file  . Attends English as a second language teacher Meetings: Not on file  . Marital Status: Not on file  Intimate Partner Violence:   . Fear of Current or Ex-Partner: Not on file  . Emotionally Abused: Not on file  . Physically Abused: Not on file  . Sexually Abused: Not on file   Family History  Problem Relation Age of Onset  . Bipolar disorder Mother   . Anxiety disorder Mother   . Dementia Mother   . Depression Mother   .  Mental illness Mother   . Vision loss Mother   . Alzheimer's disease Mother   . Alcohol abuse Paternal Uncle   . Bipolar disorder Maternal Grandmother   . Dementia Maternal Grandmother   . Alzheimer's disease Maternal Grandmother   . Alcohol abuse Paternal Uncle   . Stroke Brother   . Deep vein thrombosis Son   . Heart disease Father   . Hyperlipidemia Father   . Hypertension Father   . Stroke Father   . Vision loss Father   . Atrial fibrillation Sister   . Heart disease Brother   . Heart disease Brother   . ADD / ADHD Neg Hx   . Drug abuse Neg Hx   . OCD Neg Hx   . Paranoid behavior Neg Hx   . Schizophrenia Neg Hx   . Seizures Neg Hx   . Sexual abuse Neg Hx   . Physical abuse Neg Hx       Review of Systems  All other systems reviewed and are negative.      Objective:   Physical Exam Vitals reviewed.  Constitutional:      General: She is not in acute distress.    Appearance: She is well-developed. She is not diaphoretic.  Neck:     Thyroid: No thyromegaly.     Vascular: No JVD.     Trachea: No tracheal deviation.  Cardiovascular:     Rate and Rhythm: Normal rate and regular rhythm.     Pulses:          Carotid pulses are on the left side with bruit.    Heart sounds: Normal heart sounds. No murmur heard.  No friction rub. No gallop.   Pulmonary:     Effort: Pulmonary effort is normal. No respiratory distress.     Breath sounds: Normal breath sounds. No stridor. No wheezing or rales.  Musculoskeletal:     Lumbar back: Tenderness present. Decreased range of motion.   Neurological:     Mental Status: She is alert.     Motor: No abnormal muscle tone.           Assessment & Plan:  SBO (small bowel obstruction) ([place]) - Plan: CBC with Differential/Platelet, COMPLETE METABOLIC PANEL WITH GFR, Magnesium  Clinically, the patient has recovered.  Her abdomen is soft today nondistended with normal bowel sounds.  There is no guarding or rebound.  She is tolerating feeds without difficulty.  She has an appointment to see the surgeon on September 16.  I have encouraged her to keep this appointment so that they can perform the CT enterography to rule out cancer recurrence or physical mass that could have caused the obstruction.  Most likely this was due to adhesions from her previous abdominal surgery I explained this in detail to the patient.  We will hold off on the pain medication that she takes once a day for back pain until after she sees the surgeon.  Repeat a CBC, CMP, and magnesium level today

## 2020-06-09 ENCOUNTER — Inpatient Hospital Stay (HOSPITAL_COMMUNITY): Payer: Medicare Other

## 2020-06-09 LAB — CBC WITH DIFFERENTIAL/PLATELET
Absolute Monocytes: 470 cells/uL (ref 200–950)
Basophils Absolute: 29 cells/uL (ref 0–200)
Basophils Relative: 0.6 %
Eosinophils Absolute: 62 cells/uL (ref 15–500)
Eosinophils Relative: 1.3 %
HCT: 37.5 % (ref 35.0–45.0)
Hemoglobin: 12.3 g/dL (ref 11.7–15.5)
Lymphs Abs: 1397 cells/uL (ref 850–3900)
MCH: 31.1 pg (ref 27.0–33.0)
MCHC: 32.8 g/dL (ref 32.0–36.0)
MCV: 94.9 fL (ref 80.0–100.0)
MPV: 10.3 fL (ref 7.5–12.5)
Monocytes Relative: 9.8 %
Neutro Abs: 2842 cells/uL (ref 1500–7800)
Neutrophils Relative %: 59.2 %
Platelets: 337 10*3/uL (ref 140–400)
RBC: 3.95 10*6/uL (ref 3.80–5.10)
RDW: 13.7 % (ref 11.0–15.0)
Total Lymphocyte: 29.1 %
WBC: 4.8 10*3/uL (ref 3.8–10.8)

## 2020-06-09 LAB — COMPLETE METABOLIC PANEL WITH GFR
AG Ratio: 1.5 (calc) (ref 1.0–2.5)
ALT: 15 U/L (ref 6–29)
AST: 27 U/L (ref 10–35)
Albumin: 3.7 g/dL (ref 3.6–5.1)
Alkaline phosphatase (APISO): 100 U/L (ref 37–153)
BUN: 16 mg/dL (ref 7–25)
CO2: 27 mmol/L (ref 20–32)
Calcium: 9.1 mg/dL (ref 8.6–10.4)
Chloride: 100 mmol/L (ref 98–110)
Creat: 0.8 mg/dL (ref 0.50–0.99)
GFR, Est African American: 87 mL/min/{1.73_m2} (ref >=60)
GFR, Est Non African American: 75 mL/min/{1.73_m2} (ref >=60)
Globulin: 2.5 g/dL (calc) (ref 1.9–3.7)
Glucose, Bld: 75 mg/dL (ref 65–99)
Potassium: 4.4 mmol/L (ref 3.5–5.3)
Sodium: 137 mmol/L (ref 135–146)
Total Bilirubin: 0.2 mg/dL (ref 0.2–1.2)
Total Protein: 6.2 g/dL (ref 6.1–8.1)

## 2020-06-09 LAB — MAGNESIUM: Magnesium: 2 mg/dL (ref 1.5–2.5)

## 2020-06-10 ENCOUNTER — Other Ambulatory Visit: Payer: Self-pay

## 2020-06-10 ENCOUNTER — Encounter: Payer: Self-pay | Admitting: General Surgery

## 2020-06-10 ENCOUNTER — Ambulatory Visit (INDEPENDENT_AMBULATORY_CARE_PROVIDER_SITE_OTHER): Payer: Medicare Other | Admitting: General Surgery

## 2020-06-10 VITALS — BP 116/68 | HR 84 | Temp 98.4°F | Resp 16 | Ht 65.0 in | Wt 132.0 lb

## 2020-06-10 DIAGNOSIS — K56699 Other intestinal obstruction unspecified as to partial versus complete obstruction: Secondary | ICD-10-CM

## 2020-06-10 DIAGNOSIS — Z8543 Personal history of malignant neoplasm of ovary: Secondary | ICD-10-CM | POA: Diagnosis not present

## 2020-06-10 DIAGNOSIS — K56609 Unspecified intestinal obstruction, unspecified as to partial versus complete obstruction: Secondary | ICD-10-CM | POA: Diagnosis not present

## 2020-06-10 DIAGNOSIS — C210 Malignant neoplasm of anus, unspecified: Secondary | ICD-10-CM

## 2020-06-10 NOTE — Progress Notes (Signed)
[facility] Note   HPI:  69 y.o. Female presents to clinic for follow-up evaluation after a SBO in the hospital. She has a history of ovarian cancer and debulking and her SBO was in the pelvis. I had reviewed the imaging with radiology and we felt obtaining repeat imaging given her history at a later date to look at the bowel wall would be important. She is doing well overall, eating and having BMs.  Review of Systems:  No fever or chills All other review of systems: otherwise negative   Vital Signs:  BP 116/68    Pulse 84    Temp 98.4 F (36.9 C) (Oral)    Resp 16    Ht 5\' 5"  (1.651 m)    Wt 132 lb (59.9 kg)    SpO2 95%    BMI 21.97 kg/m    Physical Exam:  Physical Exam Vitals reviewed.  Cardiovascular:     Rate and Rhythm: Normal rate.  Pulmonary:     Effort: Pulmonary effort is normal.  Abdominal:     General: There is no distension.     Palpations: Abdomen is soft.     Tenderness: There is no abdominal tenderness.     Assessment:  69 y.o. yo Female with a SBO and prior ovarian and anal cancer. Had some concern for wall thickening so will need to repeat imaging.  Plan:  CT enterography ordered for October.  Will call with results.  PRN follow up    Curlene Labrum, MD [facility] [address] [place], [place] [address] [phone] (office)

## 2020-06-10 NOTE — Patient Instructions (Signed)
CT enterography ordered for October.  Will call with results.

## 2020-06-15 ENCOUNTER — Other Ambulatory Visit: Payer: Self-pay | Admitting: Family Medicine

## 2020-06-15 ENCOUNTER — Other Ambulatory Visit: Payer: Self-pay

## 2020-06-15 ENCOUNTER — Telehealth: Payer: Self-pay

## 2020-06-15 ENCOUNTER — Inpatient Hospital Stay (HOSPITAL_COMMUNITY): Payer: Medicare Other | Attending: Hematology

## 2020-06-15 DIAGNOSIS — D649 Anemia, unspecified: Secondary | ICD-10-CM | POA: Insufficient documentation

## 2020-06-15 DIAGNOSIS — Z85048 Personal history of other malignant neoplasm of rectum, rectosigmoid junction, and anus: Secondary | ICD-10-CM | POA: Diagnosis not present

## 2020-06-15 DIAGNOSIS — Z8543 Personal history of malignant neoplasm of ovary: Secondary | ICD-10-CM | POA: Insufficient documentation

## 2020-06-15 DIAGNOSIS — Z87891 Personal history of nicotine dependence: Secondary | ICD-10-CM | POA: Diagnosis not present

## 2020-06-15 DIAGNOSIS — Z452 Encounter for adjustment and management of vascular access device: Secondary | ICD-10-CM | POA: Insufficient documentation

## 2020-06-15 DIAGNOSIS — K56609 Unspecified intestinal obstruction, unspecified as to partial versus complete obstruction: Secondary | ICD-10-CM | POA: Diagnosis not present

## 2020-06-15 LAB — CBC WITH DIFFERENTIAL/PLATELET
Abs Immature Granulocytes: 0.02 10*3/uL (ref 0.00–0.07)
Basophils Absolute: 0 10*3/uL (ref 0.0–0.1)
Basophils Relative: 1 %
Eosinophils Absolute: 0.1 10*3/uL (ref 0.0–0.5)
Eosinophils Relative: 1 %
HCT: 40.4 % (ref 36.0–46.0)
Hemoglobin: 13 g/dL (ref 12.0–15.0)
Immature Granulocytes: 0 %
Lymphocytes Relative: 24 %
Lymphs Abs: 1.2 10*3/uL (ref 0.7–4.0)
MCH: 31 pg (ref 26.0–34.0)
MCHC: 32.2 g/dL (ref 30.0–36.0)
MCV: 96.2 fL (ref 80.0–100.0)
Monocytes Absolute: 0.4 10*3/uL (ref 0.1–1.0)
Monocytes Relative: 8 %
Neutro Abs: 3.3 10*3/uL (ref 1.7–7.7)
Neutrophils Relative %: 66 %
Platelets: 309 10*3/uL (ref 150–400)
RBC: 4.2 MIL/uL (ref 3.87–5.11)
RDW: 14.8 % (ref 11.5–15.5)
WBC: 5 10*3/uL (ref 4.0–10.5)
nRBC: 0 % (ref 0.0–0.2)

## 2020-06-15 LAB — COMPREHENSIVE METABOLIC PANEL
ALT: 18 U/L (ref 0–44)
AST: 26 U/L (ref 15–41)
Albumin: 3.8 g/dL (ref 3.5–5.0)
Alkaline Phosphatase: 74 U/L (ref 38–126)
Anion gap: 10 (ref 5–15)
BUN: 21 mg/dL (ref 8–23)
CO2: 28 mmol/L (ref 22–32)
Calcium: 9.5 mg/dL (ref 8.9–10.3)
Chloride: 100 mmol/L (ref 98–111)
Creatinine, Ser: 0.91 mg/dL (ref 0.44–1.00)
GFR calc Af Amer: >60 mL/min (ref >60)
GFR calc non Af Amer: >60 mL/min (ref >60)
Glucose, Bld: 102 mg/dL — ABNORMAL HIGH (ref 70–99)
Potassium: 4.4 mmol/L (ref 3.5–5.1)
Sodium: 138 mmol/L (ref 135–145)
Total Bilirubin: 0.5 mg/dL (ref 0.3–1.2)
Total Protein: 6.8 g/dL (ref 6.5–8.1)

## 2020-06-15 LAB — IRON AND TIBC
Iron: 66 ug/dL (ref 28–170)
Saturation Ratios: 20 % (ref 10.4–31.8)
TIBC: 335 ug/dL (ref 250–450)
UIBC: 269 ug/dL

## 2020-06-15 LAB — FERRITIN: Ferritin: 46 ng/mL (ref 11–307)

## 2020-06-15 MED ORDER — HYDROCODONE-ACETAMINOPHEN 5-325 MG PO TABS
1.0000 | ORAL_TABLET | Freq: Four times a day (QID) | ORAL | 0 refills | Status: DC | PRN
Start: 2020-06-15 — End: 2020-07-07

## 2020-06-15 NOTE — Telephone Encounter (Signed)
Abigail Wagner would like her Hydrocodone refilled, I didn't see it on medication list.

## 2020-06-16 ENCOUNTER — Inpatient Hospital Stay (HOSPITAL_BASED_OUTPATIENT_CLINIC_OR_DEPARTMENT_OTHER): Payer: Medicare Other | Admitting: Hematology

## 2020-06-16 ENCOUNTER — Other Ambulatory Visit: Payer: Self-pay

## 2020-06-16 VITALS — BP 126/64 | HR 91 | Temp 96.9°F | Resp 16 | Wt 129.2 lb

## 2020-06-16 DIAGNOSIS — D649 Anemia, unspecified: Secondary | ICD-10-CM | POA: Diagnosis not present

## 2020-06-16 DIAGNOSIS — Z452 Encounter for adjustment and management of vascular access device: Secondary | ICD-10-CM | POA: Diagnosis not present

## 2020-06-16 DIAGNOSIS — Z87891 Personal history of nicotine dependence: Secondary | ICD-10-CM | POA: Diagnosis not present

## 2020-06-16 DIAGNOSIS — Z85048 Personal history of other malignant neoplasm of rectum, rectosigmoid junction, and anus: Secondary | ICD-10-CM | POA: Diagnosis not present

## 2020-06-16 DIAGNOSIS — C210 Malignant neoplasm of anus, unspecified: Secondary | ICD-10-CM

## 2020-06-16 DIAGNOSIS — K56609 Unspecified intestinal obstruction, unspecified as to partial versus complete obstruction: Secondary | ICD-10-CM | POA: Diagnosis not present

## 2020-06-16 DIAGNOSIS — C569 Malignant neoplasm of unspecified ovary: Secondary | ICD-10-CM | POA: Diagnosis not present

## 2020-06-16 NOTE — Progress Notes (Signed)
Abigail [place], [place] [address]   CLINIC:  Medical Oncology/Hematology  PCP:  Abigail Frizzle, MD [address] [place] [address] [phone]   REASON FOR VISIT:  Follow-up for anal Wagner and ovarian Wagner  PRIOR THERAPY:  1. Debulking surgery with BSO on 07/04/2014. 2. Chemoradiation therapy with 5-FU and mitomycin completed on 12/06/2015.  NGS Results: Not done  CURRENT THERAPY: Observation  BRIEF ONCOLOGIC HISTORY:  Oncology History  History of ovarian Wagner  06/02/2014 Procedure   Diagnostic laparoscopy, lysis of adhesions, biopsy of omental nodules, biopsy right ovary by Dr Abigail Wagner   06/04/2014 Pathology Results   Diagnosis 1. Omentum, biopsy, Anterior peritoneal & omental nodule - SEROUS CARCINOMA WITH ASSOCIATED ABUNDANT PSAMMOMA BODIES AND DESMOPLASTIC STROMAL REACTION CONSISTENT WITH INVASIVE IMPLANTS. - SEE COMMENT. 2. Omentum, biopsy, Omental nodule - SEROUS CARCINOMA WITH ASSOCIATED ABUNDANT PSAMMOMA BODIES AND DESMOPLASTIC STROMAL REACTION CONSISTENT WITH INVASIVE IMPLANTS. - SEE COMMENT. 3. Ovary, biopsy/wedge resection, Right ovary - SMALL FRAGMENTS OF ATYPICAL PAPILLARY PROLIFERATION WITH PSAMMOMA BODIES CONSISTENT WITH AT LEAST SEROUS BORDERLINE TUMOR.   07/03/2014 Procedure   Exploratory laparotomy, omentectomy, Bilateral salpingo-oophorectomy, inptraperitoneal PORT placement by Abigail Wagner       07/24/2014 - 12/07/2014 Chemotherapy   Carboplatin/Paclitaxel x 6 cycles    08/17/2014 Procedure   Insertion of 8 French power port clear view tunneled venous vascular access device next line use of fluoroscopy for guidance and positioning Use of ultrasound for venipuncture Removal of intraperitoneal chemotherapy port By Abigail Wagner   05/14/2017 Genetic Testing   Negative genetic testing on the Abigail Wagner panel.  The [facility] gene panel offered by Abigail Wagner includes sequencing and  deletion/duplication testing of the following 28 genes: APC, ATM, BARD1, BMPR1A, BRCA1, BRCA2, BRIP1, CHD1, CDK4, CDKN2A, CHEK2, EPCAM (large rearrangement only), MLH1, MSH2, MSH6, MUTYH, NBN, PALB2, PMS2, PTEN, RAD51C, RAD51D, SMAD4, STK11, and TP53. Sequencing was performed for select regions of POLE and POLD1, and large rearrangement analysis was performed for select regions of GREM1. The report date is April 27, 2017.  HRD testing was performed on the ovarian tumor.  The tumor was negative for any deleterious BRCA1 or BRCA2 mutations.    Genomic instability status was not interpretable.  Therefore, Abigail Wagner to analyze the ovarian tumor for genomic instability.  The report date is May 14, 2017.    Anal Wagner (Abigail Wagner)  09/09/2015 Procedure   Rectovaginal septal mass biopsy by Abigail Wagner   09/10/2015 Pathology Results   Soft tissue mass, biopsy, rectovaginal septal mass - SQUAMOUS CELL CARCINOMA.   10/15/2015 PET scan   1. Focal hypermetabolism with associated ill-defined soft tissue fullness at the junction of the anterior anal wall and posterior inferior vaginal wall, in keeping with the provided history of a primary anal carcinoma. 2. No hypermetabolic locoregional or distant metastatic disease. 3. Status post hysterectomy, with no abnormal findings at the vaginal cuff. No evidence of hypermetabolic peritoneal tumor. 4. Tiny nonobstructing bilateral renal stones.   10/25/2015 - 12/06/2015 Radiation Therapy   Abigail Wagner, Abigail Wagner   10/25/2015 - 12/06/2015 Chemotherapy   Mitomycin C and 5FU, by Abigail Wagner    06/15/2016 Procedure   EXCISION RECTOVAGINAOL FISTULA WITH MUCOSAL ADVANCEMENT FLAP by Abigail Wagner   06/18/2016 Pathology Results   Fistula, rectovaginal SQUAMOUS, COLUMNAR JUNCTION MUCOSA AND SUBCUTANOUS SOFT TISSUE WITH FISTULA NO EVIDENCE OF MALIGNANCY   09/20/2016 Imaging   CT CAP- 1. No acute findings and no  evidence for recurrent tumor or metastatic disease. 2.  Aortic atherosclerosis and coronary artery calcification     Wagner STAGING: Wagner Staging Anal Wagner (Abigail Wagner) Staging form: Anus, AJCC 8th Edition - Clinical: Stage IIA (cT2, cN0, cM0) - Signed by Abigail Wagner on 09/27/2016   INTERVAL HISTORY:  Abigail Wagner, a 69 y.o. female, returns for routine follow-up of her anal Wagner and ovarian Wagner. Abigail Wagner was last seen on 03/11/2020.  Today she is accompanied by her husband. She reports that she is feeling better after having diarrhea and abdominal pain over the past 3 weeks, which are similar symptoms to when she went to the ED on 8/31 for SBO. She was having watery diarrhea multiple times a day, but without blood.   REVIEW OF Wagner:  Review of Wagner  Constitutional: Positive for appetite change (severely decreased) and fatigue (severe).  Gastrointestinal: Positive for abdominal pain (8/10 abdominal pain), diarrhea (watery stools) and vomiting. Negative for blood in stool.  Neurological: Positive for numbness (hands).  Psychiatric/Behavioral: Positive for depression. The patient is nervous/anxious.   All other Wagner reviewed and are negative.   PAST MEDICAL/SURGICAL HISTORY:  Past Medical History:  Diagnosis Date  . Allergy   . Anal carcinoma Abigail Wagner) oncologist-  dr Abigail Wagner ([facility])   dx 12/ 2016 SCC --  chemo and radiation- Abigail Wagner  . Anemia    due to her Wagner  . Anxiety    Dr. Harrington Wagner at [facility] (psychiatrist).    . Carcinoma of ovary, stage 3 Abigail Wagner) oncologist-  dr Abigail Wagner/  dr Abigail Wagner (Wagner Wagner in [facility] w/ [facility])--  no recurrency   dx 09/ 2015  Stage IIIB  papillary ovarian carcinoma  s/p  omentectomy and BSO &  chemotherapy (completed 12-07-2014)  . Chemotherapy induced nausea and vomiting 10/23/2014  . Chronic kidney disease    stones  . DDD (degenerative disc disease), lumbosacral   . GERD (gastroesophageal reflux disease)   . Headache(784.0)   . History of acute respiratory failure      05-30-2009  drug overdose-- (intubated for 2 days)  and aspiration pneumonia  . History of adenomatous polyp of colon 10/07/2015   tubular adenoma high grade dysplasia  . History of herpes genitalis   . History of kidney stones   . History of ovarian Wagner 08/03/2015  . History of suicide attempt    per documentation in epic  05-30-2009  overdose benaodiazepine  . HTN (hypertension)    takes Metoprolol daily  . Hyperlipidemia   . Hypertension 09/27/2016  . IBS (irritable bowel syndrome)    takes Bentyl daily  . Internal carotid artery stenosis, left    <50%, ulcerative plaque at carotid bulb.   . Major depression   . Mood disorder ([place])   . OA (osteoarthritis)    both hips  . Ovarian Wagner ([place])    2015-TAH/BSO  . Prolapse of vaginal vault after hysterectomy 07/20/2015  . Rectovaginal fistula 08/05/2015  . Sigmoid diverticulosis   . Smokers' cough [facility])    Past Surgical History:  Procedure Laterality Date  . BREAST ENHANCEMENT SURGERY  1986  . [place]  . COLONOSCOPY N/A 10/07/2015   Procedure: COLONOSCOPY;  Surgeon: Rogene Houston, MD;  Location[facility];  Service: Endoscopy;  Laterality: N/A;  7:30  . EXPLORATORY LAPAROTOMY/ OMENTECTOMY/  BILATERAL SALPINGOOPHORECTOMY/  [facility] PLACEMENT  07-03-2014   [facility]  . KNEE ARTHROSCOPY Left 09-22-2004  . LAPAROSCOPY N/A 06/02/2014  Procedure: DIAGNOSTIC LAPAROSCOPY, OMENTAL BIOPSY, RIGHT OVARY BIOPSY, LYSIS OF ADHESIONS;  Surgeon: Claud Kelp, MD;  Location: [facility] OR;  Service: General;  Laterality: N/A;  . MUCOSAL ADVANCEMENT FLAP N/A 06/15/2016   Procedure: EXCISION RECTOVAGINAOL FISTULA WITH MUCOSAL ADVANCEMENT FLAP;  Surgeon: Romie Levee, MD;  Location: [facility];  Service: General;  Laterality: N/A;  . PLACEMENT OF SETON N/A 09/09/2015   Procedure: PLACEMENT OF SETON;  Surgeon: Romie Levee, MD;  Location: [facility];  Service: General;  Laterality: N/A;  .  PORT-A-CATH REMOVAL Right 08/17/2014   Procedure: REMOVAL INTRAPERITONEAL CHEMO PORT;  Surgeon: Claud Kelp, MD;  Location: [facility];  Service: General;  Laterality: Right;  . PORTACATH PLACEMENT Right 08/17/2014   Procedure:  PLACE NEW PORT A CATH;  Surgeon: Claud Kelp, MD;  Location: [facility];  Service: General;  Laterality: Right;  . RECTAL BIOPSY N/A 09/09/2015   Procedure: BIOPSY OF RECTOVAGINAL MASS;  Surgeon: Romie Levee, MD;  Location: [facility];  Service: General;  Laterality: N/A;  . TUBAL LIGATION  YRS AGO  . VAGINAL HYSTERECTOMY  1981   fibroids    SOCIAL HISTORY:  Social History   Socioeconomic History  . Marital status: Married    Spouse name: Abigail Wagner  . Number of children: 3  . Years of education: 23  . Highest education level: Not on file  Occupational History  . Occupation: retire    Comment: [employer] Abigail  Tobacco Use  . Smoking status: Former Smoker    Packs/day: 0.50    Years: 43.00    Pack years: 21.50    Types: Cigarettes  . Smokeless tobacco: Never Used  Vaping Use  . Vaping Use: Never used  Substance and Sexual Activity  . Alcohol use: No  . Drug use: No  . Sexual activity: Not Currently    Birth control/protection: Surgical    Comment: hyst  Other Topics Concern  . Not on file  Social History Narrative   Lives at home with Abigail Wagner   Retired [employer]   Social Determinants of Health   Financial Resource Strain:   . Difficulty of Paying Living Expenses: Not on file  Food Insecurity:   . Worried About Programme researcher, broadcasting/film/video in the Last Year: Not on file  . Ran Out of Food in the Last Year: Not on file  Transportation Needs:   . Lack of Transportation (Medical): Not on file  . Lack of Transportation (Non-Medical): Not on file  Physical Activity:   . Days of Exercise per Week: Not on file  . Minutes of Exercise per Session: Not on file  Stress:   . Feeling of Stress : Not on file  Social  Connections:   . Frequency of Communication with Friends and Family: Not on file  . Frequency of Social Gatherings with Friends and Family: Not on file  . Attends Religious Services: Not on file  . Active Member of Clubs or Organizations: Not on file  . Attends Banker Meetings: Not on file  . Marital Status: Not on file  Intimate Partner Violence:   . Fear of Current or Ex-Partner: Not on file  . Emotionally Abused: Not on file  . Physically Abused: Not on file  . Sexually Abused: Not on file    FAMILY HISTORY:  Family History  Problem Relation Age of Onset  . Bipolar disorder Mother   . Anxiety disorder Mother   . Dementia  Mother   . Depression Mother   . Mental illness Mother   . Vision loss Mother   . Alzheimer's disease Mother   . Alcohol abuse Paternal Uncle   . Bipolar disorder Maternal Grandmother   . Dementia Maternal Grandmother   . Alzheimer's disease Maternal Grandmother   . Alcohol abuse Paternal Uncle   . Stroke Brother   . Deep vein thrombosis Son   . Heart disease Father   . Hyperlipidemia Father   . Hypertension Father   . Stroke Father   . Vision loss Father   . Atrial fibrillation Sister   . Heart disease Brother   . Heart disease Brother   . ADD / ADHD Neg Hx   . Drug abuse Neg Hx   . OCD Neg Hx   . Paranoid behavior Neg Hx   . Schizophrenia Neg Hx   . Seizures Neg Hx   . Sexual abuse Neg Hx   . Physical abuse Neg Hx     CURRENT MEDICATIONS:  Current Outpatient Medications  Medication Sig Dispense Refill  . albuterol (VENTOLIN HFA) 108 (90 Base) MCG/ACT inhaler Inhale 2 puffs into the lungs every 6 (six) hours as needed for wheezing or shortness of breath. 8 g 0  . Aspirin-Salicylamide-Caffeine (BC HEADACHE) 325-95-16 MG TABS Take 1 packet by mouth daily as needed (for pain).    Marland Kitchen b complex vitamins tablet Take 1 tablet by mouth daily.    . benzonatate (TESSALON PERLES) 100 MG capsule Take 1 capsule (100 mg total) by mouth 3  (three) times daily as needed. 20 capsule 0  . Coenzyme Q10 (CO Q 10) 10 MG CAPS Take by mouth daily.    . divalproex (DEPAKOTE ER) 500 MG 24 hr tablet Take 1 tablet (500 mg total) by mouth daily. 90 tablet 3  . docusate sodium (COLACE) 100 MG capsule Take 1 capsule (100 mg total) by mouth daily as needed for moderate constipation. 30 capsule 2  . escitalopram (LEXAPRO) 20 MG tablet Take 1 tablet (20 mg total) by mouth 2 (two) times daily. 180 tablet 2  . Ferrous Sulfate (IRON) 142 (45 Fe) MG TBCR Take 1 tablet by mouth daily.    Marland Kitchen HYDROcodone-acetaminophen (NORCO) 5-325 MG tablet Take 1 tablet by mouth every 6 (six) hours as needed for moderate pain. 30 tablet 0  . KRILL OIL PO Take 350 mg by mouth daily.    Marland Kitchen LORazepam (ATIVAN) 1 MG tablet Take 1 tablet (1 mg total) by mouth 3 (three) times daily as needed for anxiety. 90 tablet 5  . losartan (COZAAR) 50 MG tablet TAKE 1 TABLET BY MOUTH EVERY DAY (Patient taking differently: Take 50 mg by mouth daily. ) 90 tablet 3  . Multiple Vitamin (MULTIVITAMIN) tablet Take 1 tablet by mouth daily.    Marland Kitchen omeprazole (PRILOSEC) 40 MG capsule Take 1 capsule (40 mg total) by mouth daily. 30 capsule 3  . polyethylene glycol (MIRALAX / GLYCOLAX) 17 g packet Take 17 g by mouth daily. 30 each 1  . rosuvastatin (CRESTOR) 20 MG tablet Take 1 tablet (20 mg total) by mouth daily. 90 tablet 1   No current facility-administered medications for this visit.    ALLERGIES:  Allergies  Allergen Reactions  . Morphine And Related     Delirium with sbo    PHYSICAL EXAM:  Performance status (ECOG): 1 - Symptomatic but completely ambulatory  Vitals:   06/16/20 1439  BP: 126/64  Pulse: 91  Resp: 16  Temp: (!) 96.9 F (36.1 C)  SpO2: 99%   Wt Readings from Last 3 Encounters:  06/16/20 129 lb 3.2 oz (58.6 kg)  06/10/20 132 lb (59.9 kg)  06/08/20 121 lb (54.9 kg)   Physical Exam Vitals reviewed.  Constitutional:      Appearance: Normal appearance.    Cardiovascular:     Rate and Rhythm: Normal rate and regular rhythm.     Pulses: Normal pulses.     Heart sounds: Normal heart sounds.  Pulmonary:     Effort: Pulmonary effort is normal.     Breath sounds: Normal breath sounds.  Abdominal:     General: Bowel sounds are normal.     Palpations: Abdomen is soft. There is no hepatomegaly, splenomegaly or mass.     Tenderness: There is no abdominal tenderness.     Hernia: No hernia is present.  Lymphadenopathy:     Lower Body: No right inguinal adenopathy. No left inguinal adenopathy.  Neurological:     General: No focal deficit present.     Mental Status: She is alert and oriented to person, place, and time.  Psychiatric:        Mood and Affect: Mood normal.        Behavior: Behavior normal.      LABORATORY DATA:  I have reviewed the labs as listed.  CBC Latest Ref Rng & Units 06/15/2020 06/08/2020 05/28/2020  WBC 4.0 - 10.5 K/uL 5.0 4.8 3.1(L)  Hemoglobin 12.0 - 15.0 g/dL 13.0 12.3 12.2  Hematocrit 36 - 46 % 40.4 37.5 37.6  Platelets 150 - 400 K/uL 309 337 241   CMP Latest Ref Rng & Units 06/15/2020 06/08/2020 05/28/2020  Glucose 70 - 99 mg/dL 102(H) 75 119(H)  BUN 8 - 23 mg/dL $Remove'21 16 11  'RKyoXrJ$ Creatinine 0.44 - 1.00 mg/dL 0.91 0.80 0.54  Sodium 135 - 145 mmol/L 138 137 138  Potassium 3.5 - 5.1 mmol/L 4.4 4.4 3.5  Chloride 98 - 111 mmol/L 100 100 104  CO2 22 - 32 mmol/L $RemoveB'28 27 23  'LzwjfVna$ Calcium 8.9 - 10.3 mg/dL 9.5 9.1 8.8(L)  Total Protein 6.5 - 8.1 g/dL 6.8 6.2 -  Total Bilirubin 0.3 - 1.2 mg/dL 0.5 0.2 -  Alkaline Phos 38 - 126 U/L 74 - -  AST 15 - 41 U/L 26 27 -  ALT 0 - 44 U/L 18 15 -   Lab Results  Component Value Date   TIBC 335 06/15/2020   TIBC 365 03/04/2020   TIBC 363 09/01/2019   FERRITIN 46 06/15/2020   FERRITIN 20 03/04/2020   FERRITIN 33 09/01/2019   IRONPCTSAT 20 06/15/2020   IRONPCTSAT 18 03/04/2020   IRONPCTSAT 24 09/01/2019   Lab Results  Component Value Date   CA125 18.8 05/25/2020   CA125 8.5 03/04/2020    CA125 8.3 09/01/2019   Lab Results  Component Value Date   CAN199 9 05/25/2020    DIAGNOSTIC IMAGING:  I have independently reviewed the scans and discussed with the patient. CT ABDOMEN PELVIS W CONTRAST  Result Date: 05/25/2020 CLINICAL DATA:  Abdominal pain, history of ovarian and anal Wagner. EXAM: CT ABDOMEN AND PELVIS WITH CONTRAST TECHNIQUE: Multidetector CT imaging of the abdomen and pelvis was performed using the standard protocol following bolus administration of intravenous contrast. CONTRAST:  116mL OMNIPAQUE IOHEXOL 300 MG/ML  SOLN COMPARISON:  CT abdomen pelvis dated 09/22/2015. FINDINGS: Lower chest: There is mild bibasilar atelectasis. Hepatobiliary: No focal liver abnormality is seen. No gallstones, gallbladder wall thickening,  or biliary dilatation. Pancreas: Unremarkable. No pancreatic ductal dilatation or surrounding inflammatory changes. Spleen: Normal in size without focal abnormality. Adrenals/Urinary Tract: Adrenal glands are unremarkable. Kidneys are normal, without renal calculi, focal lesion, or hydronephrosis. Stomach/Bowel: A posterior gastric fundal diverticulum is redemonstrated. There are multiple loops of dilated small bowel with fecalization of small bowel contents and a transition point to decompressed distal small bowel in the pelvis (series 2, image 60 2-66) most consistent with a high-grade small bowel obstruction. There is a moderate amount of free intraperitoneal fluid. There is no definite mass lesion at the site of transition to decompressed distal bowel. Vascular/Lymphatic: Aortic atherosclerosis. No enlarged abdominal or pelvic lymph nodes. Reproductive: Pelvic floor laxity and cystocele are redemonstrated. Status post hysterectomy. No adnexal masses. Other: No abdominal wall hernia. Musculoskeletal: Degenerative changes are seen in the spine. IMPRESSION: 1. Findings most consistent with a high-grade small bowel obstruction with a transition point in the pelvis.  Moderate amount of free intraperitoneal fluid. No definite mass lesion at the site of transition to decompressed distal bowel. 2. Pelvic floor laxity and cystocele. Aortic Atherosclerosis (ICD10-I70.0). These results were called by telephone at the time of interpretation on 05/25/2020 at 2:19 pm to provider [facility] , who verbally acknowledged these results. Electronically Signed   By: Zerita Boers M.D.   On: 05/25/2020 14:20     ASSESSMENT:  1. Stage II (T2 N0 M0) anal Wagner: -Diagnosed in December 2016. -Chemoradiation therapy with 5-FU and mitomycin completed on 12/06/2015, treated at [facility]. -She is continuing follow-ups with Abigail Wagner with anoscopy.  2. Stage III ovarian Wagner: -Diagnosed in 2015, status post debulking surgery followed by 6 cycles of carboplatin and Taxol completed in March 2016.   PLAN:  1. Stage II (T2 N0 M0) anal Wagner: -No pain in the anal region.  No inguinal lymph nodes palpable. -Continue follow-ups with Abigail Wagner.  2. Stage III ovarian Wagner: -CA-125 on 05/25/2020 was normal. -CTAP on 05/25/2020 did not show any evidence of mass or recurrence. -Recommend follow-up in 6 months with CA-125, CBC and CMP.  3.  Normocytic anemia: -Her most recent hemoglobin on 06/15/2020 improved to 13.  Ferritin was 46 and percent saturation was 20.  4.  Small bowel obstruction: -She was recently hospitalized with small bowel obstruction which was managed conservatively. -CT AP on 05/25/2020 showed high-grade small bowel obstruction with transition point in the pelvis with moderate amount of free intraperitoneal fluid.  No definitive mass lesion at the site of transition. -Patient still having diarrhea daily and vague abdominal pain. -He is being seen by Dr. Constance Haw.  A CT enterography was ordered to be done in October.   Orders placed this encounter:  No orders of the defined types were placed in this encounter.    Derek Jack, MD [place] [phone]   I, Milinda Antis, am acting as a scribe for Dr. Sanda Linger.  I, Derek Jack MD, have reviewed the above documentation for accuracy and completeness, and I agree with the above.

## 2020-06-16 NOTE — Patient Instructions (Signed)
[facility] Discharge Instructions  You were seen today by Dr. Katragadda. He went over your recent results and scans. Dr. Katragadda will see you back in 6 months for labs and follow up.   Thank you for choosing [facility] to provide your oncology and hematology care.  To afford each patient quality time with our provider, please arrive at least 15 minutes before your scheduled appointment time.   If you have a lab appointment with the Cancer Center please come in thru the Main Entrance and check in at the main information desk  You need to re-schedule your appointment should you arrive 10 or more minutes late.  We strive to give you quality time with our providers, and arriving late affects you and other patients whose appointments are after yours.  Also, if you no show three or more times for appointments you may be dismissed from the clinic at the providers discretion.     Again, thank you for choosing [facility].  Our hope is that these requests will decrease the amount of time that you wait before being seen by our physicians.       _____________________________________________________________  Should you have questions after your visit to  Cancer Center, please contact our office at [phone] between the hours of 8:00 a.m. and 4:30 p.m.  Voicemails left after 4:00 p.m. will not be returned until the following business day.  For prescription refill requests, have your pharmacy contact our office and allow 72 hours.    Cancer Center Support Programs:   > Cancer Support Group  2nd Tuesday of the month 1pm-2pm, Journey Room   

## 2020-06-22 ENCOUNTER — Encounter (HOSPITAL_COMMUNITY): Payer: Self-pay

## 2020-06-22 ENCOUNTER — Encounter: Payer: Self-pay | Admitting: General Surgery

## 2020-06-22 ENCOUNTER — Ambulatory Visit: Payer: Medicare Other | Admitting: General Surgery

## 2020-06-22 ENCOUNTER — Other Ambulatory Visit: Payer: Self-pay

## 2020-06-22 ENCOUNTER — Inpatient Hospital Stay (HOSPITAL_COMMUNITY): Payer: Medicare Other

## 2020-06-22 VITALS — BP 117/72 | HR 88 | Temp 98.0°F | Resp 16 | Ht 65.0 in | Wt 125.0 lb

## 2020-06-22 DIAGNOSIS — Z452 Encounter for adjustment and management of vascular access device: Secondary | ICD-10-CM | POA: Diagnosis not present

## 2020-06-22 DIAGNOSIS — K56609 Unspecified intestinal obstruction, unspecified as to partial versus complete obstruction: Secondary | ICD-10-CM

## 2020-06-22 DIAGNOSIS — Z87891 Personal history of nicotine dependence: Secondary | ICD-10-CM | POA: Diagnosis not present

## 2020-06-22 DIAGNOSIS — R1030 Lower abdominal pain, unspecified: Secondary | ICD-10-CM

## 2020-06-22 DIAGNOSIS — D649 Anemia, unspecified: Secondary | ICD-10-CM | POA: Diagnosis not present

## 2020-06-22 DIAGNOSIS — Z85048 Personal history of other malignant neoplasm of rectum, rectosigmoid junction, and anus: Secondary | ICD-10-CM | POA: Diagnosis not present

## 2020-06-22 MED ORDER — SODIUM CHLORIDE 0.9% FLUSH
10.0000 mL | INTRAVENOUS | Status: DC | PRN
  Administered 2020-06-22: 10 mL via INTRAVENOUS

## 2020-06-22 MED ORDER — HEPARIN SOD (PORK) LOCK FLUSH 100 UNIT/ML IV SOLN
500.0000 [IU] | Freq: Once | INTRAVENOUS | Status: AC
  Administered 2020-06-22: 500 [IU] via INTRAVENOUS

## 2020-06-22 NOTE — Progress Notes (Signed)
[facility] Note   HPI:  69 y.o. Female presents to clinic for follow-up evaluation after being admitted with an SBO earlier in the month. She had some issues over the last week with left sided abdominal pain and diarrhea. She was not eating and drinking well and had some nausea/vomiting. She never stopped having Bms. She says that this has resolved itself over the weekend. With her SBO we had decided to do a CT enterography to look at the SB wall given her history of ovary cancer. Currently she is eating and having bowel function. Pain has improved.  Review of Systems:  Improving pain No fever or chills All other review of systems: otherwise negative   Vital Signs:  BP 117/72   Pulse 88   Temp 98 F (36.7 C) (Oral)   Resp 16   Ht 5\' 5"  (1.651 m)   Wt 125 lb (56.7 kg)   SpO2 94%   BMI 20.80 kg/m    Physical Exam:  Physical Exam Vitals reviewed.  Cardiovascular:     Rate and Rhythm: Normal rate.  Pulmonary:     Effort: Pulmonary effort is normal.  Abdominal:     General: There is no distension.     Palpations: Abdomen is soft.     Tenderness: There is abdominal tenderness in the left lower quadrant.  Neurological:     Mental Status: She is alert.     Assessment:  69 y.o. yo Female with a recent SBO that resolved with nonoperative measures. I saw her with plans to do CT enterography in 4-6 weeks. She had episode of pain and diarrhea/ nausea/vomiting last week that has resolved but is concerning.  Plan:  - Will proceed with CT earlier and see what this demonstrates, may be that she had a GI bug versus some degree of partial obstruction last week   - Will call with results   All of the above recommendations were discussed with the patient and patient's family, and all of patient's and family's questions were answered to their expressed satisfaction.  Curlene Labrum, MD [facility] [address] [place], [place]  [address] [phone] (office)

## 2020-06-22 NOTE — Patient Instructions (Addendum)
CT to be rescheduled for sooner. Someone should notify you of the change. If you have not heard in the next few days let us know.  Will call with results.

## 2020-06-22 NOTE — Progress Notes (Signed)
[name] presented for Portacath access and flush.  Portacath located right chest wall accessed with  H 20 needle.  Good blood return present. Portacath flushed with 36ml NS and 500U/63ml Heparin and needle removed intact.  Procedure tolerated well and without incident.  Discharged ambulatory in stable condition.

## 2020-06-24 ENCOUNTER — Ambulatory Visit (HOSPITAL_COMMUNITY)
Admission: RE | Admit: 2020-06-24 | Discharge: 2020-06-24 | Disposition: A | Discharge status: Home / Self Care | Payer: Medicare Other | Source: Ambulatory Visit | Attending: General Surgery | Admitting: General Surgery

## 2020-06-24 ENCOUNTER — Other Ambulatory Visit: Payer: Self-pay

## 2020-06-24 DIAGNOSIS — K56609 Unspecified intestinal obstruction, unspecified as to partial versus complete obstruction: Secondary | ICD-10-CM | POA: Insufficient documentation

## 2020-06-24 DIAGNOSIS — C210 Malignant neoplasm of anus, unspecified: Secondary | ICD-10-CM

## 2020-06-24 DIAGNOSIS — Z8543 Personal history of malignant neoplasm of ovary: Secondary | ICD-10-CM | POA: Diagnosis present

## 2020-06-24 DIAGNOSIS — R197 Diarrhea, unspecified: Secondary | ICD-10-CM | POA: Diagnosis not present

## 2020-06-24 DIAGNOSIS — K56699 Other intestinal obstruction unspecified as to partial versus complete obstruction: Secondary | ICD-10-CM | POA: Diagnosis not present

## 2020-06-24 DIAGNOSIS — I700 Atherosclerosis of aorta: Secondary | ICD-10-CM | POA: Diagnosis not present

## 2020-06-24 DIAGNOSIS — R1110 Vomiting, unspecified: Secondary | ICD-10-CM | POA: Diagnosis not present

## 2020-06-24 MED ORDER — IOHEXOL 300 MG/ML  SOLN
100.0000 mL | Freq: Once | INTRAMUSCULAR | Status: AC | PRN
  Administered 2020-06-24: 100 mL via INTRAVENOUS

## 2020-06-25 ENCOUNTER — Telehealth (INDEPENDENT_AMBULATORY_CARE_PROVIDER_SITE_OTHER): Payer: Medicare Other | Admitting: General Surgery

## 2020-06-25 DIAGNOSIS — K56609 Unspecified intestinal obstruction, unspecified as to partial versus complete obstruction: Secondary | ICD-10-CM

## 2020-06-25 NOTE — Telephone Encounter (Signed)
[facility]   CT enterography done yesterday. Read not in the AM and talked to radiology. Radiologist has read now but report still not in but report is that it is normal.   Says she is doing better and not having diarrhea any more.  CT is reassuring. Follow up PRN.  Curlene Labrum, MD [facility] [address] [place], [place] [address] [phone] (office)

## 2020-06-25 NOTE — Telephone Encounter (Signed)
Thanks, Gershon Mussel

## 2020-07-02 ENCOUNTER — Other Ambulatory Visit: Payer: Self-pay | Admitting: Family Medicine

## 2020-07-07 ENCOUNTER — Other Ambulatory Visit: Payer: Self-pay

## 2020-07-08 ENCOUNTER — Other Ambulatory Visit (HOSPITAL_COMMUNITY): Payer: Medicare Other

## 2020-07-08 MED ORDER — HYDROCODONE-ACETAMINOPHEN 5-325 MG PO TABS
1.0000 | ORAL_TABLET | Freq: Four times a day (QID) | ORAL | 0 refills | Status: DC | PRN
Start: 2020-07-08 — End: 2020-08-06

## 2020-07-22 ENCOUNTER — Ambulatory Visit (INDEPENDENT_AMBULATORY_CARE_PROVIDER_SITE_OTHER): Payer: Medicare Other | Admitting: Family Medicine

## 2020-07-22 ENCOUNTER — Other Ambulatory Visit: Payer: Self-pay

## 2020-07-22 VITALS — BP 110/60 | HR 82 | Temp 97.4°F | Ht 65.0 in | Wt 121.0 lb

## 2020-07-22 DIAGNOSIS — R1032 Left lower quadrant pain: Secondary | ICD-10-CM | POA: Diagnosis not present

## 2020-07-22 DIAGNOSIS — K56609 Unspecified intestinal obstruction, unspecified as to partial versus complete obstruction: Secondary | ICD-10-CM

## 2020-07-22 NOTE — Progress Notes (Signed)
Subjective:    Patient ID: Abigail Wagner, female    DOB: 1951/04/19, 69 y.o.   MRN: [id number]  HPI   Patient is a very pleasant 69 year old Caucasian female who has a very complicated past medical history who has a history of stage III ovarian cancer with metastasis to the omentum.  This was surgically resected with a bilateral salpingo-oophorectomy.  Patient had previously had a partial hysterectomy.  She also has a history of stage II anal cancer.  Patient was recently admitted to the hospital with a bowel obstruction.  I have copied relevant portions of her discharge summary below: Admit date: 05/25/2020 Discharge date: 05/28/2020  Time spent: 35 minutes  Recommendations for Outpatient Follow-up:  1. Repeat basic metabolic panel and magnesium level to follow electrolytes trend and stability. 2. Reassess blood pressure and adjust antihypertensive regimen as needed   Discharge Diagnoses:  Principal Problem:   SBO (small bowel obstruction) ([place]) Active Problems:   Depression   Bipolar 1 disorder (HCC)   History of ovarian cancer   Anal cancer ([place])   Essential hypertension   Discharge Condition: Stable and improved.  Discharged home with instruction to follow-up with PCP in 10 days and with general surgery in about 2 weeks.  CODE STATUS: Full code  Diet recommendation: Heart healthy/soft diet.     Filed Weights   05/25/20 0953  Weight: 62.1 kg    History of present illness:  As per H&P written by Dr. Denton Brick on 05/25/20 Abigail Wagner a 69 y.o.femalewith medical history significant forvery large anal cancer, hypertension, bipolar disorder and depression. Patient presented to the ED with complaints of abdominal bloating over the past 2 weeks. She had one episode of vomiting about 4 days ago. Initially had loose stools, but over the past several days she has become constipated. Her last bowel movement was yesterday, it was a small amount and it was  hard.  Patient has gotten both doses of her Covid vaccine.  ED Course:Vitals stable. Potassium 3.4 otherwise unremarkable CBC BMP. UA not suggestive of, ketones present. Contrast shows a high-grade small bowel obstruction with a transition point in the pelvis. Moderate amount of free intraperitoneal fluid. No mass at the site of transition. EDPtalked to general surgeon Dr.Bridges,recommended admission, NG tube. N.p.o. will see patient.  Hospital Course:  1-SBO (small bowel obstruction) (HCC) -With concern for adhesions or return of colon cancer -Patient with debulk surgery for ovarian cancer and anal cancer in the past -High-grade transition obstruction appreciated on CT scan on admission. -Reportsno abdominalpain, no further nausea or vomiting and is passing gas/moving her bowels.. -Discussed with general surgery who has recommended to advance to soft diet and let patient go home with follow-up in 2 weeks for CT enterography as an outpatient. Appreciate their assistance and recommendations. -Patient has been discharge on MiraLAX and as needed Colace.  2-Depression/anxiety/Bipolar 1 disorder ([place]) -Continue as needed Ativan -Resume the use of Depakote and Lexapro  3-History of ovarian cancer/anal cancer -Continue outpatient follow-up surveillance with oncology service.  4-hypertension -Blood pressure stable and rising -Will discharge him back on losartan and instructed to follow heart healthy diet.  5-hypokalemia/hypomagnesemia -In the setting of poor oral intake and GI losses prior to admission (vomiting) -Repleted. -Check a basic metabolic panel and magnesium level at follow-up visit to reassess electrolytes stability  6-nausea/vomiting -In the setting of problem #1 -resolved   06/08/20 She is here today for follow-up.  Overall she seems to be doing much better.  She denies any pain in her abdomen.  She reports daily flatus.  She also is having daily bowel  movements.  She denies any melena or hematochezia.  General surgery had recommended a CT enterography as an outpatient.  She has an appointment to see Dr. Constance Haw on September 16.  She denies any further nausea or vomiting.  She is tolerating food without any difficulty.  She does report fatigue and she is slowly regaining her strength.  She states that when she had morphine in the hospital, she was delirious and had to be placed in restraints.  She states that that is the second time she is had that reaction to morphine.  Therefore she would like me to list this as an allergy.  That is certainly out of character for this patient who is normally extremely sweet and polite and never shows evidence of confusion or delirium.  The remainder of her vital signs today are normal.  At that time, my plan was: Clinically, the patient has recovered.  Her abdomen is soft today nondistended with normal bowel sounds.  There is no guarding or rebound.  She is tolerating feeds without difficulty.  She has an appointment to see the surgeon on September 16.  I have encouraged her to keep this appointment so that they can perform the CT enterography to rule out cancer recurrence or physical mass that could have caused the obstruction.  Most likely this was due to adhesions from her previous abdominal surgery I explained this in detail to the patient.  We will hold off on the pain medication that she takes once a day for back pain until after she sees the surgeon.  Repeat a CBC, CMP, and magnesium level today  07/22/20 Patient saw Dr. Constance Haw and had a CT enterogram performed at the end of September. There was no obvious obstruction seen on the CT [facility]. However the patient states that she has not felt right ever since the bowel obstruction. She states that 2 weeks ago she developed sharp left lower quadrant abdominal pain that radiated from the left lower quadrant to the right lower quadrant. The pain was intense.  Associated with it was nausea and vomiting. The pain gradually subsided. However she does report feeling bloated and nauseated similar to what she felt when she had a bowel obstruction. She is afraid to eat. She is afraid that if she eats more than a few bites she will start throwing up and have nausea and worsening pain. Today on exam, her abdomen is soft nondistended with normal bowel sounds. There is no guarding or rebound or abdominal distention.  Past Surgical History:  Procedure Laterality Date  . BREAST ENHANCEMENT SURGERY  1986  . [place]  . COLONOSCOPY N/A 10/07/2015   Procedure: COLONOSCOPY;  Surgeon: Rogene Houston, MD;  [facility];  Service: Endoscopy;  Laterality: N/A;  7:30  . EXPLORATORY LAPAROTOMY/ OMENTECTOMY/  BILATERAL SALPINGOOPHORECTOMY/  [facility] PLACEMENT  07-03-2014   [facility]  . KNEE ARTHROSCOPY Left 09-22-2004  . LAPAROSCOPY N/A 06/02/2014   Procedure: DIAGNOSTIC LAPAROSCOPY, OMENTAL BIOPSY, RIGHT OVARY BIOPSY, LYSIS OF ADHESIONS;  Surgeon: Fanny Skates, MD;  Location: [place];  Service: General;  Laterality: N/A;  . MUCOSAL ADVANCEMENT FLAP N/A 06/15/2016   Procedure: EXCISION RECTOVAGINAOL FISTULA WITH MUCOSAL ADVANCEMENT FLAP;  Surgeon: Leighton Ruff, MD;  Location: [place];  Service: General;  Laterality: N/A;  . PLACEMENT OF SETON N/A 09/09/2015   Procedure: PLACEMENT  OF SETON;  Surgeon: Leighton Ruff, MD;  Location: [facility];  Service: General;  Laterality: N/A;  . PORT-A-CATH REMOVAL Right 08/17/2014   Procedure: REMOVAL INTRAPERITONEAL CHEMO PORT;  Surgeon: Fanny Skates, MD;  Location: [place];  Service: General;  Laterality: Right;  . PORTACATH PLACEMENT Right 08/17/2014   Procedure:  PLACE NEW PORT A CATH;  Surgeon: Fanny Skates, MD;  Location: [place];  Service: General;  Laterality: Right;  . RECTAL BIOPSY N/A 09/09/2015   Procedure: BIOPSY OF  RECTOVAGINAL MASS;  Surgeon: Leighton Ruff, MD;  Location: [place];  Service: General;  Laterality: N/A;  . TUBAL LIGATION  YRS AGO  . VAGINAL HYSTERECTOMY  1981   fibroids   Current Outpatient Medications on File Prior to Visit  Medication Sig Dispense Refill  . albuterol (VENTOLIN HFA) 108 (90 Base) MCG/ACT inhaler Inhale 2 puffs into the lungs every 6 (six) hours as needed for wheezing or shortness of breath. 8 g 0  . Aspirin-Salicylamide-Caffeine (BC HEADACHE) 325-95-16 MG TABS Take 1 packet by mouth daily as needed (for pain).    Marland Kitchen b complex vitamins tablet Take 1 tablet by mouth daily.    . benzonatate (TESSALON PERLES) 100 MG capsule Take 1 capsule (100 mg total) by mouth 3 (three) times daily as needed. 20 capsule 0  . Coenzyme Q10 (CO Q 10) 10 MG CAPS Take by mouth daily.    . divalproex (DEPAKOTE ER) 500 MG 24 hr tablet Take 1 tablet (500 mg total) by mouth daily. 90 tablet 3  . docusate sodium (COLACE) 100 MG capsule Take 1 capsule (100 mg total) by mouth daily as needed for moderate constipation. 30 capsule 2  . escitalopram (LEXAPRO) 20 MG tablet Take 1 tablet (20 mg total) by mouth 2 (two) times daily. 180 tablet 2  . Ferrous Sulfate (IRON) 142 (45 Fe) MG TBCR Take 1 tablet by mouth daily.    Marland Kitchen HYDROcodone-acetaminophen (NORCO) 5-325 MG tablet Take 1 tablet by mouth every 6 (six) hours as needed for moderate pain. 30 tablet 0  . KRILL OIL PO Take 350 mg by mouth daily.    Marland Kitchen LORazepam (ATIVAN) 1 MG tablet Take 1 tablet (1 mg total) by mouth 3 (three) times daily as needed for anxiety. 90 tablet 5  . losartan (COZAAR) 50 MG tablet TAKE 1 TABLET BY MOUTH EVERY DAY (Patient taking differently: Take 50 mg by mouth daily. ) 90 tablet 3  . Multiple Vitamin (MULTIVITAMIN) tablet Take 1 tablet by mouth daily.    Marland Kitchen omeprazole (PRILOSEC) 40 MG capsule TAKE 1 CAPSULE BY MOUTH EVERY DAY 90 capsule 1  . polyethylene glycol (MIRALAX / GLYCOLAX) 17 g packet Take 17 g by mouth  daily. 30 each 1  . rosuvastatin (CRESTOR) 20 MG tablet Take 1 tablet (20 mg total) by mouth daily. 90 tablet 1   No current facility-administered medications on file prior to visit.   Allergies  Allergen Reactions  . Morphine And Related     Delirium with sbo   Social History   Socioeconomic History  . Marital status: Married    Spouse name: Coralyn Mark  . Number of children: 3  . Years of education: 65  . Highest education level: Not on file  Occupational History  . Occupation: retire    Comment: [employer]  Tobacco Use  . Smoking status: Former Smoker    Packs/day: 0.50    Years: 43.00    Pack  years: 21.50    Types: Cigarettes  . Smokeless tobacco: Never Used  Vaping Use  . Vaping Use: Never used  Substance and Sexual Activity  . Alcohol use: No  . Drug use: No  . Sexual activity: Not Currently    Birth control/protection: Surgical    Comment: hyst  Other Topics Concern  . Not on file  Social History Narrative   Lives at home with Coralyn Mark   Retired [place] Strain:   . Difficulty of Paying Living Expenses: Not on file  Food Insecurity:   . Worried About Charity fundraiser in the Last Year: Not on file  . Ran Out of Food in the Last Year: Not on file  Transportation Needs:   . Lack of Transportation (Medical): Not on file  . Lack of Transportation (Non-Medical): Not on file  Physical Activity:   . Days of Exercise per Week: Not on file  . Minutes of Exercise per Session: Not on file  Stress:   . Feeling of Stress : Not on file  Social Connections:   . Frequency of Communication with Friends and Family: Not on file  . Frequency of Social Gatherings with Friends and Family: Not on file  . Attends Religious Services: Not on file  . Active Member of Clubs or Organizations: Not on file  . Attends Archivist Meetings: Not on file  . Marital Status: Not on file  Intimate Partner Violence:   . Fear of  Current or Ex-Partner: Not on file  . Emotionally Abused: Not on file  . Physically Abused: Not on file  . Sexually Abused: Not on file   Family History  Problem Relation Age of Onset  . Bipolar disorder Mother   . Anxiety disorder Mother   . Dementia Mother   . Depression Mother   . Mental illness Mother   . Vision loss Mother   . Alzheimer's disease Mother   . Alcohol abuse Paternal Uncle   . Bipolar disorder Maternal Grandmother   . Dementia Maternal Grandmother   . Alzheimer's disease Maternal Grandmother   . Alcohol abuse Paternal Uncle   . Stroke Brother   . Deep vein thrombosis Son   . Heart disease Father   . Hyperlipidemia Father   . Hypertension Father   . Stroke Father   . Vision loss Father   . Atrial fibrillation Sister   . Heart disease Brother   . Heart disease Brother   . ADD / ADHD Neg Hx   . Drug abuse Neg Hx   . OCD Neg Hx   . Paranoid behavior Neg Hx   . Schizophrenia Neg Hx   . Seizures Neg Hx   . Sexual abuse Neg Hx   . Physical abuse Neg Hx       Review of Systems  All other systems reviewed and are negative.      Objective:   Physical Exam Vitals reviewed.  Constitutional:      General: She is not in acute distress.    Appearance: She is well-developed. She is not diaphoretic.  Neck:     Thyroid: No thyromegaly.     Vascular: No JVD.     Trachea: No tracheal deviation.  Cardiovascular:     Rate and Rhythm: Normal rate and regular rhythm.     Pulses:          Carotid pulses are on the left side  with bruit.    Heart sounds: Normal heart sounds. No murmur heard.  No friction rub. No gallop.   Pulmonary:     Effort: Pulmonary effort is normal. No respiratory distress.     Breath sounds: Normal breath sounds. No stridor. No wheezing or rales.  Abdominal:     General: Abdomen is flat. Bowel sounds are normal. There is no distension.     Palpations: Abdomen is soft. There is no mass.     Tenderness: There is no abdominal tenderness.   Neurological:     Mental Status: She is alert.     Motor: No abnormal muscle tone.           Assessment & Plan:  Left lower quadrant abdominal pain - Plan: CBC with Differential/Platelet, COMPLETE METABOLIC PANEL WITH GFR Patient states that the pain felt similar to when she had a bowel obstruction however the pain is now gone. Differential diagnosis includes partial bowel obstruction, spontaneously resolving bowel obstruction, diverticulitis. At the present time the patient is pain-free however she is afraid to eat due to her concerned that she will develop abdominal distention and vomiting and pain. Therefore of asked the patient to go ahead and liberalize her diet. She needs to start out with bland foods and slowly advance the diet from think such as applesauce and mashed potatoes to more substantial food as tolerated. Meanwhile I will consult GI as the patient may benefit from capsule endoscopy to evaluate for any possible small bowel obstructing lesions.  She is at high risk for abdominal adhesions

## 2020-07-23 LAB — COMPLETE METABOLIC PANEL WITH GFR
AG Ratio: 1.6 (calc) (ref 1.0–2.5)
ALT: 9 U/L (ref 6–29)
AST: 19 U/L (ref 10–35)
Albumin: 3.9 g/dL (ref 3.6–5.1)
Alkaline phosphatase (APISO): 66 U/L (ref 37–153)
BUN: 18 mg/dL (ref 7–25)
CO2: 26 mmol/L (ref 20–32)
Calcium: 9.5 mg/dL (ref 8.6–10.4)
Chloride: 104 mmol/L (ref 98–110)
Creat: 0.75 mg/dL (ref 0.50–0.99)
GFR, Est African American: 94 mL/min/{1.73_m2} (ref >=60)
GFR, Est Non African American: 81 mL/min/{1.73_m2} (ref >=60)
Globulin: 2.5 g/dL (calc) (ref 1.9–3.7)
Glucose, Bld: 80 mg/dL (ref 65–99)
Potassium: 5 mmol/L (ref 3.5–5.3)
Sodium: 140 mmol/L (ref 135–146)
Total Bilirubin: 0.3 mg/dL (ref 0.2–1.2)
Total Protein: 6.4 g/dL (ref 6.1–8.1)

## 2020-07-23 LAB — CBC WITH DIFFERENTIAL/PLATELET
Absolute Monocytes: 329 cells/uL (ref 200–950)
Basophils Absolute: 28 cells/uL (ref 0–200)
Basophils Relative: 0.6 %
Eosinophils Absolute: 71 cells/uL (ref 15–500)
Eosinophils Relative: 1.5 %
HCT: 36.3 % (ref 35.0–45.0)
Hemoglobin: 12.1 g/dL (ref 11.7–15.5)
Lymphs Abs: 1631 cells/uL (ref 850–3900)
MCH: 31.1 pg (ref 27.0–33.0)
MCHC: 33.3 g/dL (ref 32.0–36.0)
MCV: 93.3 fL (ref 80.0–100.0)
MPV: 11.4 fL (ref 7.5–12.5)
Monocytes Relative: 7 %
Neutro Abs: 2641 cells/uL (ref 1500–7800)
Neutrophils Relative %: 56.2 %
Platelets: 255 10*3/uL (ref 140–400)
RBC: 3.89 10*6/uL (ref 3.80–5.10)
RDW: 14.9 % (ref 11.0–15.0)
Total Lymphocyte: 34.7 %
WBC: 4.7 10*3/uL (ref 3.8–10.8)

## 2020-07-28 ENCOUNTER — Ambulatory Visit (HOSPITAL_COMMUNITY): Payer: Medicare Other

## 2020-08-04 ENCOUNTER — Telehealth: Payer: Self-pay | Admitting: Family Medicine

## 2020-08-04 NOTE — Telephone Encounter (Signed)
Pt hasn't received a call for her referral to see a Gastroenterology

## 2020-08-05 NOTE — Telephone Encounter (Signed)
Patient returned my call and I gave her Dr. Olevia Perches office number.

## 2020-08-05 NOTE — Telephone Encounter (Signed)
I have left a message for patient to return my call.  She can call Dr. Olevia Perches office at [phone] and get the referral scheduled. The referral has been sent to them.

## 2020-08-06 ENCOUNTER — Other Ambulatory Visit: Payer: Self-pay

## 2020-08-09 ENCOUNTER — Other Ambulatory Visit: Payer: Self-pay

## 2020-08-09 ENCOUNTER — Encounter: Payer: Self-pay | Admitting: Family Medicine

## 2020-08-09 MED ORDER — HYDROCODONE-ACETAMINOPHEN 5-325 MG PO TABS
1.0000 | ORAL_TABLET | Freq: Four times a day (QID) | ORAL | 0 refills | Status: DC | PRN
Start: 2020-08-09 — End: 2020-09-09

## 2020-08-09 NOTE — Telephone Encounter (Signed)
Any further recommendations as she waits for GI appointment?

## 2020-08-10 ENCOUNTER — Other Ambulatory Visit: Payer: Self-pay

## 2020-08-10 ENCOUNTER — Encounter (HOSPITAL_COMMUNITY): Payer: Self-pay | Admitting: Psychiatry

## 2020-08-10 ENCOUNTER — Telehealth (INDEPENDENT_AMBULATORY_CARE_PROVIDER_SITE_OTHER): Payer: Medicare Other | Admitting: Psychiatry

## 2020-08-10 DIAGNOSIS — F331 Major depressive disorder, recurrent, moderate: Secondary | ICD-10-CM | POA: Diagnosis not present

## 2020-08-10 MED ORDER — LORAZEPAM 1 MG PO TABS: 1.0000 mg | ORAL_TABLET | Freq: Three times a day (TID) | ORAL | 5 refills | Status: DC | PRN

## 2020-08-10 MED ORDER — ESCITALOPRAM OXALATE 20 MG PO TABS: 20.0000 mg | ORAL_TABLET | Freq: Two times a day (BID) | ORAL | 2 refills | Status: DC

## 2020-08-10 MED ORDER — DIVALPROEX SODIUM ER 500 MG PO TB24
500.0000 mg | ORAL_TABLET | Freq: Every day | ORAL | 3 refills | Status: DC
Start: 2020-08-10 — End: 2021-02-07

## 2020-08-10 NOTE — Progress Notes (Signed)
Virtual Visit via Telephone Note  I connected with Abigail Wagner on 08/10/20 at 11:20 AM EST by telephone and verified that I am speaking with the correct person using two identifiers.  Location: Patient: home Provider: office   I discussed the limitations, risks, security and privacy concerns of performing an evaluation and management service by telephone and the availability of in person appointments. I also discussed with the patient that there may be a patient responsible charge related to this service. The patient expressed understanding and agreed to proceed.   I discussed the assessment and treatment plan with the patient. The patient was provided an opportunity to ask questions and all were answered. The patient agreed with the plan and demonstrated an understanding of the instructions.   The patient was advised to call back or seek an in-person evaluation if the symptoms worsen or if the condition fails to improve as anticipated.  I provided 15 minutes of non-face-to-face time during this encounter.   [name], MD  [facility] MD/PA/NP OP Progress Note  08/10/2020 11:34 AM Abigail Wagner [name]  MRN:  [id number]  Chief Complaint:  Chief Complaint    Depression; Anxiety; Follow-up     TKW:IOXB patient is a 69 year old married white female lives with her husband in [place]. She has 3 children and 5 grandchildren. She is retired from [employer].  The patient states she's had depression since her 70s. She was hospitalized several times, last time being in 2010 when she took a drug overdose. She was going through a lot of family stress back then. She states that she was hospitalized [facility] and since then she has never wanted to go back. She's been quite stable despite her low doses of medication. She's also stopped drinking alcohol which is made a big difference  The patient returns for follow-up after 6 months.  Since then she has had a short hospitalization for a  bowel obstruction.  This seems to have resolved but for the last several weeks she has had chronic abdominal pain and diarrhea.  She had an abdominal CT which was negative.  She is trying to get in with a GI specialist but has not had much luck yet.  She has lost about 30 pounds.  She is eating a very limited repertoire of foods.  She is trying to get in with GI as soon as possible.  In the interim she states that her mood has been stable and she denies any depressive symptoms suicidal ideation or severe anxiety.  She is sleeping well.  She is just very frustrated with her abdominal symptoms.  All of her lab work has been negative including cancer markers Visit Diagnosis:    ICD-10-CM   1. Major depressive disorder, recurrent episode, moderate (HCC)  F33.1 escitalopram (LEXAPRO) 20 MG tablet    Past Psychiatric History: Past hospitalizations for depression and alcohol abuse, none since 2010  Past Medical History:  Past Medical History:  Diagnosis Date  . Allergy   . Anal carcinoma [facility]) oncologist-  dr Whitney Muse ([facility])   dx 12/ 2016 [facility] --  chemo and radiation- Dr. Marcello Moores  . Anemia    due to her cancer  . Anxiety    Dr. Harrington Challenger at [place] (psychiatrist).    . Carcinoma of ovary, stage 3 [facility]) oncologist-  dr gehrig/  dr devoresay (cancer center in [place] [facility])--  no recurrency   dx 09/ 2015  Stage IIIB  papillary ovarian carcinoma  s/p  omentectomy and BSO &  chemotherapy (completed 12-07-2014)  . Chemotherapy induced nausea and vomiting 10/23/2014  . Chronic kidney disease    stones  . DDD (degenerative disc disease), lumbosacral   . GERD (gastroesophageal reflux disease)   . Headache(784.0)   . History of acute respiratory failure    05-30-2009  drug overdose-- (intubated for 2 days)  and aspiration pneumonia  . History of adenomatous polyp of colon 10/07/2015   tubular adenoma high grade dysplasia  . History of herpes genitalis   . History of kidney stones   . History  of ovarian cancer 08/03/2015  . History of suicide attempt    per documentation in epic  05-30-2009  overdose benaodiazepine  . HTN (hypertension)    takes Metoprolol daily  . Hyperlipidemia   . Hypertension 09/27/2016  . IBS (irritable bowel syndrome)    takes Bentyl daily  . Internal carotid artery stenosis, left    <50%, ulcerative plaque at carotid bulb.   . Major depression   . Mood disorder ([place])   . OA (osteoarthritis)    both hips  . Ovarian cancer ([place])    2015-TAH/BSO  . Prolapse of vaginal vault after hysterectomy 07/20/2015  . Rectovaginal fistula 08/05/2015  . Sigmoid diverticulosis   . Smokers' cough [facility])     Past Surgical History:  Procedure Laterality Date  . BREAST ENHANCEMENT SURGERY  1986  . [place]  . COLONOSCOPY N/A 10/07/2015   Procedure: COLONOSCOPY;  Surgeon: Rogene Houston, MD;  [facility];  Service: Endoscopy;  Laterality: N/A;  7:30  . EXPLORATORY LAPAROTOMY/ OMENTECTOMY/  BILATERAL SALPINGOOPHORECTOMY/  [facility] PLACEMENT  07-03-2014   [facility]  . KNEE ARTHROSCOPY Left 09-22-2004  . LAPAROSCOPY N/A 06/02/2014   Procedure: DIAGNOSTIC LAPAROSCOPY, OMENTAL BIOPSY, RIGHT OVARY BIOPSY, LYSIS OF ADHESIONS;  Surgeon: Fanny Skates, MD;  Location: [place];  Service: General;  Laterality: N/A;  . MUCOSAL ADVANCEMENT FLAP N/A 06/15/2016   Procedure: EXCISION RECTOVAGINAOL FISTULA WITH MUCOSAL ADVANCEMENT FLAP;  Surgeon: Leighton Ruff, MD;  Location: [place];  Service: General;  Laterality: N/A;  . PLACEMENT OF SETON N/A 09/09/2015   Procedure: PLACEMENT OF SETON;  Surgeon: Leighton Ruff, MD;  Location: [place];  Service: General;  Laterality: N/A;  . PORT-A-CATH REMOVAL Right 08/17/2014   Procedure: REMOVAL INTRAPERITONEAL CHEMO PORT;  Surgeon: Fanny Skates, MD;  Location: [place];  Service: General;  Laterality: Right;  . PORTACATH PLACEMENT Right 08/17/2014   Procedure:   PLACE NEW PORT A CATH;  Surgeon: Fanny Skates, MD;  Location: [place];  Service: General;  Laterality: Right;  . RECTAL BIOPSY N/A 09/09/2015   Procedure: BIOPSY OF RECTOVAGINAL MASS;  Surgeon: Leighton Ruff, MD;  Location: [place];  Service: General;  Laterality: N/A;  . TUBAL LIGATION  YRS AGO  . VAGINAL HYSTERECTOMY  1981   fibroids    Family Psychiatric History: see below   Family History:  Family History  Problem Relation Age of Onset  . Bipolar disorder Mother   . Anxiety disorder Mother   . Dementia Mother   . Depression Mother   . Mental illness Mother   . Vision loss Mother   . Alzheimer's disease Mother   . Alcohol abuse Paternal Uncle   . Bipolar disorder Maternal Grandmother   . Dementia Maternal Grandmother   . Alzheimer's disease Maternal Grandmother   . Alcohol abuse Paternal Uncle   .  Stroke Brother   . Deep vein thrombosis Son   . Heart disease Father   . Hyperlipidemia Father   . Hypertension Father   . Stroke Father   . Vision loss Father   . Atrial fibrillation Sister   . Heart disease Brother   . Heart disease Brother   . ADD / ADHD Neg Hx   . Drug abuse Neg Hx   . OCD Neg Hx   . Paranoid behavior Neg Hx   . Schizophrenia Neg Hx   . Seizures Neg Hx   . Sexual abuse Neg Hx   . Physical abuse Neg Hx     Social History:  Social History   Socioeconomic History  . Marital status: Married    Spouse name: Coralyn Mark  . Number of children: 3  . Years of education: 42  . Highest education level: Not on file  Occupational History  . Occupation: retire    Comment: [employer]  Tobacco Use  . Smoking status: Former Smoker    Packs/day: 0.50    Years: 43.00    Pack years: 21.50    Types: Cigarettes  . Smokeless tobacco: Never Used  Vaping Use  . Vaping Use: Never used  Substance and Sexual Activity  . Alcohol use: No  . Drug use: No  . Sexual activity: Not Currently    Birth control/protection: Surgical     Comment: hyst  Other Topics Concern  . Not on file  Social History Narrative   Lives at home with Coralyn Mark   Retired [employer] Strain:   . Difficulty of Paying Living Expenses: Not on file  Food Insecurity:   . Worried About Charity fundraiser in the Last Year: Not on file  . Ran Out of Food in the Last Year: Not on file  Transportation Needs:   . Lack of Transportation (Medical): Not on file  . Lack of Transportation (Non-Medical): Not on file  Physical Activity:   . Days of Exercise per Week: Not on file  . Minutes of Exercise per Session: Not on file  Stress:   . Feeling of Stress : Not on file  Social Connections:   . Frequency of Communication with Friends and Family: Not on file  . Frequency of Social Gatherings with Friends and Family: Not on file  . Attends Religious Services: Not on file  . Active Member of Clubs or Organizations: Not on file  . Attends Archivist Meetings: Not on file  . Marital Status: Not on file    Allergies:  Allergies  Allergen Reactions  . Morphine And Related     Delirium with sbo    Metabolic Disorder Labs: No results found for: HGBA1C, MPG No results found for: PROLACTIN Lab Results  Component Value Date   CHOL 143 04/08/2020   TRIG 83 04/08/2020   HDL 57 04/08/2020   CHOLHDL 2.5 04/08/2020   VLDL 28 01/10/2017   LDLCALC 69 04/08/2020   LDLCALC 86 08/29/2019     Therapeutic Level Labs: No results found for: LITHIUM No results found for: VALPROATE No components found for:  CBMZ  Current Medications: Current Outpatient Medications  Medication Sig Dispense Refill  . albuterol (VENTOLIN HFA) 108 (90 Base) MCG/ACT inhaler Inhale 2 puffs into the lungs every 6 (six) hours as needed for wheezing or shortness of breath. 8 g 0  . Aspirin-Salicylamide-Caffeine (BC HEADACHE) 325-95-16 MG TABS Take 1 packet  by mouth daily as needed (for pain).    Marland Kitchen b complex vitamins  tablet Take 1 tablet by mouth daily.    . benzonatate (TESSALON PERLES) 100 MG capsule Take 1 capsule (100 mg total) by mouth 3 (three) times daily as needed. 20 capsule 0  . Coenzyme Q10 (CO Q 10) 10 MG CAPS Take by mouth daily.    . divalproex (DEPAKOTE ER) 500 MG 24 hr tablet Take 1 tablet (500 mg total) by mouth daily. 90 tablet 3  . docusate sodium (COLACE) 100 MG capsule Take 1 capsule (100 mg total) by mouth daily as needed for moderate constipation. 30 capsule 2  . escitalopram (LEXAPRO) 20 MG tablet Take 1 tablet (20 mg total) by mouth 2 (two) times daily. 180 tablet 2  . Ferrous Sulfate (IRON) 142 (45 Fe) MG TBCR Take 1 tablet by mouth daily.    Marland Kitchen HYDROcodone-acetaminophen (NORCO) 5-325 MG tablet Take 1 tablet by mouth every 6 (six) hours as needed for moderate pain. 30 tablet 0  . KRILL OIL PO Take 350 mg by mouth daily.    Marland Kitchen LORazepam (ATIVAN) 1 MG tablet Take 1 tablet (1 mg total) by mouth 3 (three) times daily as needed for anxiety. 90 tablet 5  . losartan (COZAAR) 50 MG tablet TAKE 1 TABLET BY MOUTH EVERY DAY (Patient taking differently: Take 50 mg by mouth daily. ) 90 tablet 3  . Multiple Vitamin (MULTIVITAMIN) tablet Take 1 tablet by mouth daily.    Marland Kitchen omeprazole (PRILOSEC) 40 MG capsule TAKE 1 CAPSULE BY MOUTH EVERY DAY 90 capsule 1  . polyethylene glycol (MIRALAX / GLYCOLAX) 17 g packet Take 17 g by mouth daily. 30 each 1  . rosuvastatin (CRESTOR) 20 MG tablet Take 1 tablet (20 mg total) by mouth daily. 90 tablet 1   No current facility-administered medications for this visit.     Musculoskeletal: Strength & Muscle Tone: within normal limits Gait & Station: normal Patient leans: N/A  Psychiatric Specialty Exam: Review of Systems  Constitutional: Positive for unexpected weight change.  Gastrointestinal: Positive for abdominal pain and diarrhea.  All other systems reviewed and are negative.   There were no vitals taken for this visit.There is no height or weight on file  to calculate BMI.  General Appearance: NA  Eye Contact:  NA  Speech:  Clear and Coherent  Volume:  Normal  Mood:  Euthymic  Affect:  NA  Thought Process:  Goal Directed  Orientation:  Full (Time, Place, and Person)  Thought Content: Rumination   Suicidal Thoughts:  No  Homicidal Thoughts:  No  Memory:  Immediate;   Good Recent;   Good Remote;   Good  Judgement:  Good  Insight:  Good  Psychomotor Activity:  Decreased  Concentration:  Concentration: Good and Attention Span: Good  Recall:  Good  Fund of Knowledge: Good  Language: Good  Akathisia:  No  Handed:  Right  AIMS (if indicated): not done  Assets:  Communication Skills Desire for Improvement Resilience Social Support Talents/Skills  ADL's:  Intact  Cognition: WNL  Sleep:  Good   Screenings: PHQ2-9     Office Visit from 06/08/2020 in [place] Office Visit from 02/16/2020 in [facility] Office Visit from 05/14/2018 in [place] Office Visit from 01/15/2018 in [place] from 01/08/2017 in [facility]  PHQ-2 Total Score 1 0 1 1 0  PHQ-9 Total Score -- -- 4 -- --  Assessment and Plan: This patient is a 69 year old female with a history of depression and a remote history of alcohol abuse and anxiety.  Despite her recent GI problems she is doing well on her current regimen.  She will continue Lexapro 20 mg twice daily for depression, Ativan 1 mg 3 times daily as needed for anxiety and Depakote ER 500 mg at bedtime for mood stabilization.  She will return to see me in 6 months or call sooner as needed   [name], MD 08/10/2020, 11:34 AM

## 2020-08-12 ENCOUNTER — Telehealth (HOSPITAL_COMMUNITY): Payer: Medicare Other | Admitting: Psychiatry

## 2020-08-23 ENCOUNTER — Encounter: Payer: Self-pay | Admitting: Family Medicine

## 2020-08-24 ENCOUNTER — Encounter: Payer: Self-pay | Admitting: Family Medicine

## 2020-08-27 ENCOUNTER — Encounter (INDEPENDENT_AMBULATORY_CARE_PROVIDER_SITE_OTHER): Payer: Self-pay | Admitting: *Deleted

## 2020-09-09 ENCOUNTER — Other Ambulatory Visit: Payer: Self-pay

## 2020-09-09 MED ORDER — HYDROCODONE-ACETAMINOPHEN 5-325 MG PO TABS
1.0000 | ORAL_TABLET | Freq: Four times a day (QID) | ORAL | 0 refills | Status: DC | PRN
Start: 2020-09-09 — End: 2020-10-08

## 2020-09-16 ENCOUNTER — Other Ambulatory Visit: Payer: Self-pay | Admitting: Adult Health

## 2020-09-21 ENCOUNTER — Other Ambulatory Visit: Payer: Self-pay | Admitting: Family Medicine

## 2020-09-23 ENCOUNTER — Encounter (HOSPITAL_COMMUNITY): Payer: Medicare Other

## 2020-10-04 ENCOUNTER — Other Ambulatory Visit: Payer: Self-pay

## 2020-10-04 ENCOUNTER — Inpatient Hospital Stay (HOSPITAL_COMMUNITY): Payer: Medicare Other | Attending: Hematology

## 2020-10-04 ENCOUNTER — Encounter (HOSPITAL_COMMUNITY): Payer: Self-pay

## 2020-10-04 DIAGNOSIS — Z8543 Personal history of malignant neoplasm of ovary: Secondary | ICD-10-CM | POA: Diagnosis present

## 2020-10-04 DIAGNOSIS — Z452 Encounter for adjustment and management of vascular access device: Secondary | ICD-10-CM | POA: Diagnosis not present

## 2020-10-04 DIAGNOSIS — Z85048 Personal history of other malignant neoplasm of rectum, rectosigmoid junction, and anus: Secondary | ICD-10-CM | POA: Diagnosis not present

## 2020-10-04 MED ORDER — HEPARIN SOD (PORK) LOCK FLUSH 100 UNIT/ML IV SOLN
500.0000 [IU] | Freq: Once | INTRAVENOUS | Status: AC
  Administered 2020-10-04: 500 [IU] via INTRAVENOUS

## 2020-10-04 MED ORDER — SODIUM CHLORIDE 0.9% FLUSH
10.0000 mL | INTRAVENOUS | Status: DC | PRN
  Administered 2020-10-04: 10 mL via INTRAVENOUS

## 2020-10-04 NOTE — Progress Notes (Signed)
RHENDA O[place] tolerated portacath flush well without complaints or incident. VSS Pt discharged self ambulatory in satisfactory condition

## 2020-10-04 NOTE — Patient Instructions (Signed)
[facility] Discharge Instructions  Portacath flushed per protocol today. Follow-up as scheduled   Thank you for choosing [facility] to provide your oncology and hematology care.  To afford each patient quality time with our provider, please arrive at least 15 minutes before your scheduled appointment time.   If you have a lab appointment with the Cancer Center please come in thru the Main Entrance and check in at the main information desk.  You need to re-schedule your appointment should you arrive 10 or more minutes late.  We strive to give you quality time with our providers, and arriving late affects you and other patients whose appointments are after yours.  Also, if you no show three or more times for appointments you may be dismissed from the clinic at the providers discretion.     Again, thank you for choosing [facility].  Our hope is that these requests will decrease the amount of time that you wait before being seen by our physicians.       _____________________________________________________________  Should you have questions after your visit to [facility], please contact our office at [phone] and follow the prompts.  Our office hours are 8:00 a.m. and 4:30 p.m. Monday - Friday.  Please note that voicemails left after 4:00 p.m. may not be returned until the following business day.  We are closed weekends and major holidays.  You do have access to a nurse 24-7, just call the main number to the clinic [phone] and do not press any options, hold on the line and a nurse will answer the phone.    For prescription refill requests, have your pharmacy contact our office and allow 72 hours.    Due to Covid, you will need to wear a mask upon entering the hospital. If you do not have a mask, a mask will be given to you at the Main Entrance upon arrival. For doctor visits, patients may  have 1 support person age 18 or older with them. For treatment visits, patients can not have anyone with them due to social distancing guidelines and our immunocompromised population.     

## 2020-10-06 ENCOUNTER — Encounter (INDEPENDENT_AMBULATORY_CARE_PROVIDER_SITE_OTHER): Payer: Self-pay | Admitting: Gastroenterology

## 2020-10-06 ENCOUNTER — Encounter (INDEPENDENT_AMBULATORY_CARE_PROVIDER_SITE_OTHER): Payer: Self-pay

## 2020-10-06 ENCOUNTER — Ambulatory Visit (INDEPENDENT_AMBULATORY_CARE_PROVIDER_SITE_OTHER): Payer: Medicare Other | Admitting: Gastroenterology

## 2020-10-06 ENCOUNTER — Other Ambulatory Visit (INDEPENDENT_AMBULATORY_CARE_PROVIDER_SITE_OTHER): Payer: Self-pay

## 2020-10-06 ENCOUNTER — Other Ambulatory Visit: Payer: Self-pay

## 2020-10-06 VITALS — BP 113/74 | HR 78 | Temp 97.7°F | Ht 65.0 in | Wt 111.0 lb

## 2020-10-06 DIAGNOSIS — Z79891 Long term (current) use of opiate analgesic: Secondary | ICD-10-CM | POA: Diagnosis not present

## 2020-10-06 DIAGNOSIS — K529 Noninfective gastroenteritis and colitis, unspecified: Secondary | ICD-10-CM | POA: Diagnosis not present

## 2020-10-06 DIAGNOSIS — R112 Nausea with vomiting, unspecified: Secondary | ICD-10-CM

## 2020-10-06 MED ORDER — ONDANSETRON HCL 4 MG PO TABS: 4.0000 mg | ORAL_TABLET | Freq: Three times a day (TID) | ORAL | 1 refills | Status: DC | PRN

## 2020-10-06 NOTE — H&P (View-Only) (Signed)
Abigail Wagner, M.D. Gastroenterology & Hepatology [facility] [address] [place], [place] [address] Primary Care Physician: Susy Frizzle, MD [address] [place] [address]  Referring MD: PCP  Chief Complaint: Diarrhea, constipation and abdominal pain  History of Present Illness: Abigail Wagner is a 70 y.o. female with PMH squamous cell carcinoma of the anal area s/p CRTx and excision with flap placement, ovarian cancer s/p BSO and CTX, depression,  history of SBO, who presents for evaluation of diarrhea, constipation and abdominal pain.  The patient presented an episode of small bowel obstruction on July 25, 2020.  She had previously presented persistent bloating of her abdomen for 2 weeks but presented recurrent vomiting for 4 days.  An NG tube was placed and the patient had spontaneous resolution of her symptoms without need of surgical resection.  Was considered her symptoms were related to possible lesions in the setting of previous debulking surgery for ovarian cancer.  Due to this, the patient had a subsequent CT enterography performed which I personally reviewed, I observed presence of largely distended stomach but no presence of inflammation throughout the stomach or small bowel, however there was questionable thickening in the ascending colon and transverse colon, possibly due to underdistention.  Patient reports that after her episodes of SBO, she has presented intermittent episodes of abdominal pain in the left side of her abdomen.  She describes the pain as stabbing pain.  States that before she used to have a BM every day but has noticed she has been presenting episodes of intermittent diarrhea and constipation.  She predominantly has diarrhea, states that at least 20 days a month she has diarrhea, very few days she has constipation.The patient reports that she may have multiple episodes of diarrhea per day up  to 6 or 7. She takes Imodium every 3-4 days to improve her bowel movements which helps.  Denies any melena or hematochezia. Has lost 40 lb since she had her episode of SBO as she has presented persistent nausea and vomiting for the same period of time. Does not take any medications for it. She has been vomiting at least once a week. She feels her appetite is OK but can't keep her food down.  Patient was taking Norco for the last year. Has taken it once a day for management of back pain.  The patient denies having any fever, chills, hematochezia, melena, hematemesis, abdominal distention, diarrhea, jaundice, pruritus.  CT enterography on 06/24/2020 - No evidence of bowel obstruction. No small bowel wall thickening or mass is evident on CT. Long segment wall thickening involving the ascending colon and transverse colon, favored to reflect underdistention. Status post hysterectomy. No findings suspicious for recurrent or metastatic disease.  Last [id number] Last Colonoscopy: 2017 Prep excellent. 7 mm polyp hot snared from distal sigmoid colon. 2 small diverticula at sigmoid colon. Noncompliant sigmoid colon. Normal rectal mucosa. Segment of seton seen on retroflexed view in the anal canal. Small hemorrhoids below the dentate line. Hard mass noted on external exam anterior to anal orifice with seton in place.  FHx: neg for any gastrointestinal/liver disease, no malignancies Social: smokes 1/2 pack a day, alcohol or illicit drug use Surgical: hysterectomy, BSO with debulking  Past Medical History: Past Medical History:  Diagnosis Date  . Allergy   . Anal carcinoma [facility]) oncologist-  dr Whitney Muse ([facility])   dx 12/ 2016 [facility] --  chemo and radiation- Dr. Marcello Moores  .  Anemia    due to her cancer  . Anxiety    Dr. Harrington Challenger at [place] (psychiatrist).    . Carcinoma of ovary, stage 3 [facility]) oncologist-  dr gehrig/  dr Doreene Eland (cancer center in [place])--  no recurrency   dx 09/ 2015   Stage IIIB  papillary ovarian carcinoma  s/p  omentectomy and BSO &  chemotherapy (completed 12-07-2014)  . Chemotherapy induced nausea and vomiting 10/23/2014  . Chronic kidney disease    stones  . DDD (degenerative disc disease), lumbosacral   . GERD (gastroesophageal reflux disease)   . Headache(784.0)   . History of acute respiratory failure    05-30-2009  drug overdose-- (intubated for 2 days)  and aspiration pneumonia  . History of adenomatous polyp of colon 10/07/2015   tubular adenoma high grade dysplasia  . History of herpes genitalis   . History of kidney stones   . History of ovarian cancer 08/03/2015  . History of suicide attempt    per documentation in epic  05-30-2009  overdose benaodiazepine  . HTN (hypertension)    takes Metoprolol daily  . Hyperlipidemia   . Hypertension 09/27/2016  . IBS (irritable bowel syndrome)    takes Bentyl daily  . Internal carotid artery stenosis, left    <50%, ulcerative plaque at carotid bulb.   . Major depression   . Mood disorder ([place])   . OA (osteoarthritis)    both hips  . Ovarian cancer ([place])    2015-TAH/BSO  . Prolapse of vaginal vault after hysterectomy 07/20/2015  . Rectovaginal fistula 08/05/2015  . Sigmoid diverticulosis   . Smokers' cough [facility])     Past Surgical History: Past Surgical History:  Procedure Laterality Date  . BREAST ENHANCEMENT SURGERY  1986  . [place]  . COLONOSCOPY N/A 10/07/2015   Procedure: COLONOSCOPY;  Surgeon: Rogene Houston, MD;  [facility];  Service: Endoscopy;  Laterality: N/A;  7:30  . EXPLORATORY LAPAROTOMY/ OMENTECTOMY/  BILATERAL SALPINGOOPHORECTOMY/  [facility] PLACEMENT  07-03-2014   [facility]  . KNEE ARTHROSCOPY Left 09-22-2004  . LAPAROSCOPY N/A 06/02/2014   Procedure: DIAGNOSTIC LAPAROSCOPY, OMENTAL BIOPSY, RIGHT OVARY BIOPSY, LYSIS OF ADHESIONS;  Surgeon: Fanny Skates, MD;  Location: [place];  Service: General;  Laterality: N/A;  . MUCOSAL ADVANCEMENT FLAP  N/A 06/15/2016   Procedure: EXCISION RECTOVAGINAOL FISTULA WITH MUCOSAL ADVANCEMENT FLAP;  Surgeon: Leighton Ruff, MD;  Location: [place];  Service: General;  Laterality: N/A;  . PLACEMENT OF SETON N/A 09/09/2015   Procedure: PLACEMENT OF SETON;  Surgeon: Leighton Ruff, MD;  Location: [place];  Service: General;  Laterality: N/A;  . PORT-A-CATH REMOVAL Right 08/17/2014   Procedure: REMOVAL INTRAPERITONEAL CHEMO PORT;  Surgeon: Fanny Skates, MD;  Location: [place];  Service: General;  Laterality: Right;  . PORTACATH PLACEMENT Right 08/17/2014   Procedure:  PLACE NEW PORT A CATH;  Surgeon: Fanny Skates, MD;  Location: [place];  Service: General;  Laterality: Right;  . RECTAL BIOPSY N/A 09/09/2015   Procedure: BIOPSY OF RECTOVAGINAL MASS;  Surgeon: Leighton Ruff, MD;  Location: [place];  Service: General;  Laterality: N/A;  . TUBAL LIGATION  YRS AGO  . VAGINAL HYSTERECTOMY  1981   fibroids    Family History: Family History  Problem Relation Age of Onset  . Bipolar disorder Mother   . Anxiety disorder Mother   . Dementia Mother   . Depression Mother   .  Mental illness Mother   . Vision loss Mother   . Alzheimer's disease Mother   . Alcohol abuse Paternal Uncle   . Bipolar disorder Maternal Grandmother   . Dementia Maternal Grandmother   . Alzheimer's disease Maternal Grandmother   . Alcohol abuse Paternal Uncle   . Stroke Brother   . Deep vein thrombosis Son   . Heart disease Father   . Hyperlipidemia Father   . Hypertension Father   . Stroke Father   . Vision loss Father   . Atrial fibrillation Sister   . Heart disease Brother   . Heart disease Brother   . ADD / ADHD Neg Hx   . Drug abuse Neg Hx   . OCD Neg Hx   . Paranoid behavior Neg Hx   . Schizophrenia Neg Hx   . Seizures Neg Hx   . Sexual abuse Neg Hx   . Physical abuse Neg Hx     Social History: Social History    Tobacco Use  Smoking Status Current Every Day Smoker  . Packs/day: 0.50  . Years: 43.00  . Pack years: 21.50  . Types: Cigarettes  Smokeless Tobacco Never Used   Social History   Substance and Sexual Activity  Alcohol Use No   Social History   Substance and Sexual Activity  Drug Use No    Allergies: Allergies  Allergen Reactions  . Morphine And Related     Delirium with sbo    Medications: Current Outpatient Medications  Medication Sig Dispense Refill  . acyclovir ointment (ZOVIRAX) 5 % USE EVERY 3-4 HOURS AS NEEDED 15 g 1  . b complex vitamins tablet Take 1 tablet by mouth daily.    . Cholecalciferol (VITAMIN D-3 PO) Take by mouth daily.    . Coenzyme Q10 (CO Q 10) 10 MG CAPS Take by mouth daily.    . divalproex (DEPAKOTE ER) 500 MG 24 hr tablet Take 1 tablet (500 mg total) by mouth daily. 90 tablet 3  . docusate sodium (COLACE) 100 MG capsule Take 1 capsule (100 mg total) by mouth daily as needed for moderate constipation. 30 capsule 2  . escitalopram (LEXAPRO) 20 MG tablet Take 1 tablet (20 mg total) by mouth 2 (two) times daily. 180 tablet 2  . HYDROcodone-acetaminophen (NORCO) 5-325 MG tablet Take 1 tablet by mouth every 6 (six) hours as needed for moderate pain. 30 tablet 0  . KRILL OIL PO Take 350 mg by mouth daily.    Marland Kitchen LORazepam (ATIVAN) 1 MG tablet Take 1 tablet (1 mg total) by mouth 3 (three) times daily as needed for anxiety. 90 tablet 5  . losartan (COZAAR) 50 MG tablet TAKE 1 TABLET BY MOUTH EVERY DAY (Patient taking differently: Take 50 mg by mouth daily.) 90 tablet 3  . Multiple Vitamin (MULTIVITAMIN) tablet Take 1 tablet by mouth daily.    Marland Kitchen omeprazole (PRILOSEC) 40 MG capsule TAKE 1 CAPSULE BY MOUTH EVERY DAY 90 capsule 1  . OVER THE COUNTER MEDICATION Patient reports that she is taking a ViViscal Tablet by mouth twice daily for hair loss.    . polyethylene glycol (MIRALAX / GLYCOLAX) 17 g packet Take 17 g by mouth daily. 30 each 1  . rosuvastatin  (CRESTOR) 20 MG tablet TAKE 1 TABLET BY MOUTH EVERY DAY 90 tablet 1  . albuterol (VENTOLIN HFA) 108 (90 Base) MCG/ACT inhaler Inhale 2 puffs into the lungs every 6 (six) hours as needed for wheezing or shortness of breath. 8 g  0  . Aspirin-Salicylamide-Caffeine (BC HEADACHE) 325-95-16 MG TABS Take 1 packet by mouth daily as needed (for pain).    . benzonatate (TESSALON PERLES) 100 MG capsule Take 1 capsule (100 mg total) by mouth 3 (three) times daily as needed. 20 capsule 0  . Ferrous Sulfate (IRON) 142 (45 Fe) MG TBCR Take 1 tablet by mouth daily. (Patient not taking: Reported on 10/06/2020)     No current facility-administered medications for this visit.    Review of Systems: GENERAL: negative for malaise, night sweats HEENT: No changes in hearing or vision, no nose bleeds or other nasal problems. NECK: Negative for lumps, goiter, pain and significant neck swelling RESPIRATORY: Negative for cough, wheezing CARDIOVASCULAR: Negative for chest pain, leg swelling, palpitations, orthopnea GI: SEE HPI MUSCULOSKELETAL: Negative for joint pain or swelling, back pain, and muscle pain. SKIN: Negative for lesions, rash PSYCH: Negative for sleep disturbance, mood disorder and recent psychosocial stressors. HEMATOLOGY Negative for prolonged bleeding, bruising easily, and swollen nodes. ENDOCRINE: Negative for cold or heat intolerance, polyuria, polydipsia and goiter. NEURO: negative for tremor, gait imbalance, syncope and seizures. The remainder of the review of systems is noncontributory.   Physical Exam: BP 113/74 (BP Location: Left Arm, Patient Position: Sitting, Cuff Size: Normal)   Pulse 78   Temp 97.7 F (36.5 C) (Oral)   Ht 5\' 5"  (1.651 m)   Wt 111 lb 0.6 oz (50.4 kg)   BMI 18.48 kg/m  GENERAL: The patient is AO x3, in no acute distress. Underweight. HEENT: Head is normocephalic and atraumatic. EOMI are intact. Mouth is well hydrated and without lesions. NECK: Supple. No  masses LUNGS: Clear to auscultation. No presence of rhonchi/wheezing/rales. Adequate chest expansion HEART: RRR, normal s1 and s2. ABDOMEN: Soft, nontender, no guarding, no peritoneal signs, and nondistended. BS +. No masses. EXTREMITIES: Without any cyanosis, clubbing, rash, lesions or edema. NEUROLOGIC: AOx3, no focal motor deficit. SKIN: no jaundice, no rashes   Imaging/Labs: as above  I personally reviewed and interpreted the available labs, imaging and endoscopic files.  Impression and Plan: OMARI KOSLOSKY is a 70 y.o. female with PMH squamous cell carcinoma of the anal area s/p CRTx and excision with flap placement, ovarian cancer s/p BSO and CTX, depression,  history of SBO, who presents for evaluation of diarrhea, constipation and abdominal pain.  Patient has presented significant symptoms since she presented her episode of small bowel obstruction.  Of concern, she has had major weight loss due to poor oral intake in the setting of recurrent vomiting.  Patient is currently being followed by general surgery due to her episode of SBO of unclear etiology, likely related to adhesions.  We will explore her symptoms further with an EGD given her gastric distention and a colonoscopy with random colonic biopsies to rule out microscopic colitis.  I explained to her that the use of narcotics could lead to some of her symptoms, especially nausea and vomiting.  She will be started on Zofran as needed for management of her vomiting.  She will also need to continue taking her Imodium to improve diarrhea episodes.  - Schedule EGD with SB Bx and colonoscopy with random colon biopsies - Start Zofran as needed for nausea/vomiting - Continue Imodium as needed for episodes of diarrhea - Attempt to decrease opiate intake - RTC 3 months  All questions were answered.      Abigail Peppers, MD Gastroenterology and Hepatology [facility]

## 2020-10-06 NOTE — Patient Instructions (Addendum)
Schedule EGD with SB Bx and colonoscopy with random colon biopsies Start Zofran as needed for nausea/vomiting Continue Imodium as needed for episodes of diarrhea Try to decrease opiate intake

## 2020-10-06 NOTE — Progress Notes (Signed)
Abigail Wagner, M.D. Gastroenterology & Hepatology [facility] [address] [place], [place] [address] Primary Care Physician: Susy Frizzle, MD [address] [place] [address]  Referring MD: PCP  Chief Complaint: Diarrhea, constipation and abdominal pain  History of Present Illness: Abigail Wagner is a 70 y.o. female with PMH squamous cell carcinoma of the anal area s/p CRTx and excision with flap placement, ovarian cancer s/p BSO and CTX, depression,  history of SBO, who presents for evaluation of diarrhea, constipation and abdominal pain.  The patient presented an episode of small bowel obstruction on July 25, 2020.  She had previously presented persistent bloating of her abdomen for 2 weeks but presented recurrent vomiting for 4 days.  An NG tube was placed and the patient had spontaneous resolution of her symptoms without need of surgical resection.  Was considered her symptoms were related to possible lesions in the setting of previous debulking surgery for ovarian cancer.  Due to this, the patient had a subsequent CT enterography performed which I personally reviewed, I observed presence of largely distended stomach but no presence of inflammation throughout the stomach or small bowel, however there was questionable thickening in the ascending colon and transverse colon, possibly due to underdistention.  Patient reports that after her episodes of SBO, she has presented intermittent episodes of abdominal pain in the left side of her abdomen.  She describes the pain as stabbing pain.  States that before she used to have a BM every day but has noticed she has been presenting episodes of intermittent diarrhea and constipation.  She predominantly has diarrhea, states that at least 20 days a month she has diarrhea, very few days she has constipation.The patient reports that she may have multiple episodes of diarrhea per day up  to 6 or 7. She takes Imodium every 3-4 days to improve her bowel movements which helps.  Denies any melena or hematochezia. Has lost 40 lb since she had her episode of SBO as she has presented persistent nausea and vomiting for the same period of time. Does not take any medications for it. She has been vomiting at least once a week. She feels her appetite is OK but can't keep her food down.  Patient was taking Norco for the last year. Has taken it once a day for management of back pain.  The patient denies having any fever, chills, hematochezia, melena, hematemesis, abdominal distention, diarrhea, jaundice, pruritus.  CT enterography on 06/24/2020 - No evidence of bowel obstruction. No small bowel wall thickening or mass is evident on CT. Long segment wall thickening involving the ascending colon and transverse colon, favored to reflect underdistention. Status post hysterectomy. No findings suspicious for recurrent or metastatic disease.  Last [id number] Last Colonoscopy: 2017 Prep excellent. 7 mm polyp hot snared from distal sigmoid colon. 2 small diverticula at sigmoid colon. Noncompliant sigmoid colon. Normal rectal mucosa. Segment of seton seen on retroflexed view in the anal canal. Small hemorrhoids below the dentate line. Hard mass noted on external exam anterior to anal orifice with seton in place.  FHx: neg for any gastrointestinal/liver disease, no malignancies Social: smokes 1/2 pack a day, alcohol or illicit drug use Surgical: hysterectomy, BSO with debulking  Past Medical History: Past Medical History:  Diagnosis Date  . Allergy   . Anal carcinoma [facility]) oncologist-  dr Whitney Muse ([facility])   dx 12/ 2016 [facility] --  chemo and radiation- Dr. Marcello Moores  .  Anemia    due to her cancer  . Anxiety    Dr. Harrington Challenger at [place] (psychiatrist).    . Carcinoma of ovary, stage 3 [facility]) oncologist-  dr gehrig/  dr Doreene Eland (cancer center in [place] w/ [facility])--  no recurrency   dx 09/ 2015   Stage IIIB  papillary ovarian carcinoma  s/p  omentectomy and BSO &  chemotherapy (completed 12-07-2014)  . Chemotherapy induced nausea and vomiting 10/23/2014  . Chronic kidney disease    stones  . DDD (degenerative disc disease), lumbosacral   . GERD (gastroesophageal reflux disease)   . Headache(784.0)   . History of acute respiratory failure    05-30-2009  drug overdose-- (intubated for 2 days)  and aspiration pneumonia  . History of adenomatous polyp of colon 10/07/2015   tubular adenoma high grade dysplasia  . History of herpes genitalis   . History of kidney stones   . History of ovarian cancer 08/03/2015  . History of suicide attempt    per documentation in epic  05-30-2009  overdose benaodiazepine  . HTN (hypertension)    takes Metoprolol daily  . Hyperlipidemia   . Hypertension 09/27/2016  . IBS (irritable bowel syndrome)    takes Bentyl daily  . Internal carotid artery stenosis, left    <50%, ulcerative plaque at carotid bulb.   . Major depression   . Mood disorder ([place])   . OA (osteoarthritis)    both hips  . Ovarian cancer ([place])    2015-TAH/BSO  . Prolapse of vaginal vault after hysterectomy 07/20/2015  . Rectovaginal fistula 08/05/2015  . Sigmoid diverticulosis   . Smokers' cough [facility])     Past Surgical History: Past Surgical History:  Procedure Laterality Date  . BREAST ENHANCEMENT SURGERY  1986  . [place]  . COLONOSCOPY N/A 10/07/2015   Procedure: COLONOSCOPY;  Surgeon: Rogene Houston, MD;  [facility];  Service: Endoscopy;  Laterality: N/A;  7:30  . EXPLORATORY LAPAROTOMY/ OMENTECTOMY/  BILATERAL SALPINGOOPHORECTOMY/  [facility] PLACEMENT  07-03-2014   [facility]  . KNEE ARTHROSCOPY Left 09-22-2004  . LAPAROSCOPY N/A 06/02/2014   Procedure: DIAGNOSTIC LAPAROSCOPY, OMENTAL BIOPSY, RIGHT OVARY BIOPSY, LYSIS OF ADHESIONS;  Surgeon: Fanny Skates, MD;  Location: [place];  Service: General;  Laterality: N/A;  . MUCOSAL ADVANCEMENT FLAP  N/A 06/15/2016   Procedure: EXCISION RECTOVAGINAOL FISTULA WITH MUCOSAL ADVANCEMENT FLAP;  Surgeon: Leighton Ruff, MD;  Location: [place];  Service: General;  Laterality: N/A;  . PLACEMENT OF SETON N/A 09/09/2015   Procedure: PLACEMENT OF SETON;  Surgeon: Leighton Ruff, MD;  Location: [place];  Service: General;  Laterality: N/A;  . PORT-A-CATH REMOVAL Right 08/17/2014   Procedure: REMOVAL INTRAPERITONEAL CHEMO PORT;  Surgeon: Fanny Skates, MD;  Location: [place];  Service: General;  Laterality: Right;  . PORTACATH PLACEMENT Right 08/17/2014   Procedure:  PLACE NEW PORT A CATH;  Surgeon: Fanny Skates, MD;  Location: [place];  Service: General;  Laterality: Right;  . RECTAL BIOPSY N/A 09/09/2015   Procedure: BIOPSY OF RECTOVAGINAL MASS;  Surgeon: Leighton Ruff, MD;  Location: [place];  Service: General;  Laterality: N/A;  . TUBAL LIGATION  YRS AGO  . VAGINAL HYSTERECTOMY  1981   fibroids    Family History: Family History  Problem Relation Age of Onset  . Bipolar disorder Mother   . Anxiety disorder Mother   . Dementia Mother   . Depression Mother   .  Mental illness Mother   . Vision loss Mother   . Alzheimer's disease Mother   . Alcohol abuse Paternal Uncle   . Bipolar disorder Maternal Grandmother   . Dementia Maternal Grandmother   . Alzheimer's disease Maternal Grandmother   . Alcohol abuse Paternal Uncle   . Stroke Brother   . Deep vein thrombosis Son   . Heart disease Father   . Hyperlipidemia Father   . Hypertension Father   . Stroke Father   . Vision loss Father   . Atrial fibrillation Sister   . Heart disease Brother   . Heart disease Brother   . ADD / ADHD Neg Hx   . Drug abuse Neg Hx   . OCD Neg Hx   . Paranoid behavior Neg Hx   . Schizophrenia Neg Hx   . Seizures Neg Hx   . Sexual abuse Neg Hx   . Physical abuse Neg Hx     Social History: Social History    Tobacco Use  Smoking Status Current Every Day Smoker  . Packs/day: 0.50  . Years: 43.00  . Pack years: 21.50  . Types: Cigarettes  Smokeless Tobacco Never Used   Social History   Substance and Sexual Activity  Alcohol Use No   Social History   Substance and Sexual Activity  Drug Use No    Allergies: Allergies  Allergen Reactions  . Morphine And Related     Delirium with sbo    Medications: Current Outpatient Medications  Medication Sig Dispense Refill  . acyclovir ointment (ZOVIRAX) 5 % USE EVERY 3-4 HOURS AS NEEDED 15 g 1  . b complex vitamins tablet Take 1 tablet by mouth daily.    . Cholecalciferol (VITAMIN D-3 PO) Take by mouth daily.    . Coenzyme Q10 (CO Q 10) 10 MG CAPS Take by mouth daily.    . divalproex (DEPAKOTE ER) 500 MG 24 hr tablet Take 1 tablet (500 mg total) by mouth daily. 90 tablet 3  . docusate sodium (COLACE) 100 MG capsule Take 1 capsule (100 mg total) by mouth daily as needed for moderate constipation. 30 capsule 2  . escitalopram (LEXAPRO) 20 MG tablet Take 1 tablet (20 mg total) by mouth 2 (two) times daily. 180 tablet 2  . HYDROcodone-acetaminophen (NORCO) 5-325 MG tablet Take 1 tablet by mouth every 6 (six) hours as needed for moderate pain. 30 tablet 0  . KRILL OIL PO Take 350 mg by mouth daily.    Marland Kitchen LORazepam (ATIVAN) 1 MG tablet Take 1 tablet (1 mg total) by mouth 3 (three) times daily as needed for anxiety. 90 tablet 5  . losartan (COZAAR) 50 MG tablet TAKE 1 TABLET BY MOUTH EVERY DAY (Patient taking differently: Take 50 mg by mouth daily.) 90 tablet 3  . Multiple Vitamin (MULTIVITAMIN) tablet Take 1 tablet by mouth daily.    Marland Kitchen omeprazole (PRILOSEC) 40 MG capsule TAKE 1 CAPSULE BY MOUTH EVERY DAY 90 capsule 1  . OVER THE COUNTER MEDICATION Patient reports that she is taking a ViViscal Tablet by mouth twice daily for hair loss.    . polyethylene glycol (MIRALAX / GLYCOLAX) 17 g packet Take 17 g by mouth daily. 30 each 1  . rosuvastatin  (CRESTOR) 20 MG tablet TAKE 1 TABLET BY MOUTH EVERY DAY 90 tablet 1  . albuterol (VENTOLIN HFA) 108 (90 Base) MCG/ACT inhaler Inhale 2 puffs into the lungs every 6 (six) hours as needed for wheezing or shortness of breath. 8 g  0  . Aspirin-Salicylamide-Caffeine (BC HEADACHE) 325-95-16 MG TABS Take 1 packet by mouth daily as needed (for pain).    . benzonatate (TESSALON PERLES) 100 MG capsule Take 1 capsule (100 mg total) by mouth 3 (three) times daily as needed. 20 capsule 0  . Ferrous Sulfate (IRON) 142 (45 Fe) MG TBCR Take 1 tablet by mouth daily. (Patient not taking: Reported on 10/06/2020)     No current facility-administered medications for this visit.    Review of Systems: GENERAL: negative for malaise, night sweats HEENT: No changes in hearing or vision, no nose bleeds or other nasal problems. NECK: Negative for lumps, goiter, pain and significant neck swelling RESPIRATORY: Negative for cough, wheezing CARDIOVASCULAR: Negative for chest pain, leg swelling, palpitations, orthopnea GI: SEE HPI MUSCULOSKELETAL: Negative for joint pain or swelling, back pain, and muscle pain. SKIN: Negative for lesions, rash PSYCH: Negative for sleep disturbance, mood disorder and recent psychosocial stressors. HEMATOLOGY Negative for prolonged bleeding, bruising easily, and swollen nodes. ENDOCRINE: Negative for cold or heat intolerance, polyuria, polydipsia and goiter. NEURO: negative for tremor, gait imbalance, syncope and seizures. The remainder of the review of systems is noncontributory.   Physical Exam: BP 113/74 (BP Location: Left Arm, Patient Position: Sitting, Cuff Size: Normal)   Pulse 78   Temp 97.7 F (36.5 C) (Oral)   Ht 5\' 5"  (1.651 m)   Wt 111 lb 0.6 oz (50.4 kg)   BMI 18.48 kg/m  GENERAL: The patient is AO x3, in no acute distress. Underweight. HEENT: Head is normocephalic and atraumatic. EOMI are intact. Mouth is well hydrated and without lesions. NECK: Supple. No  masses LUNGS: Clear to auscultation. No presence of rhonchi/wheezing/rales. Adequate chest expansion HEART: RRR, normal s1 and s2. ABDOMEN: Soft, nontender, no guarding, no peritoneal signs, and nondistended. BS +. No masses. EXTREMITIES: Without any cyanosis, clubbing, rash, lesions or edema. NEUROLOGIC: AOx3, no focal motor deficit. SKIN: no jaundice, no rashes   Imaging/Labs: as above  I personally reviewed and interpreted the available labs, imaging and endoscopic files.  Impression and Plan: Abigail Wagner is a 70 y.o. female with PMH squamous cell carcinoma of the anal area s/p CRTx and excision with flap placement, ovarian cancer s/p BSO and CTX, depression,  history of SBO, who presents for evaluation of diarrhea, constipation and abdominal pain.  Patient has presented significant symptoms since she presented her episode of small bowel obstruction.  Of concern, she has had major weight loss due to poor oral intake in the setting of recurrent vomiting.  Patient is currently being followed by general surgery due to her episode of SBO of unclear etiology, likely related to adhesions.  We will explore her symptoms further with an EGD given her gastric distention and a colonoscopy with random colonic biopsies to rule out microscopic colitis.  I explained to her that the use of narcotics could lead to some of her symptoms, especially nausea and vomiting.  She will be started on Zofran as needed for management of her vomiting.  She will also need to continue taking her Imodium to improve diarrhea episodes.  - Schedule EGD with SB Bx and colonoscopy with random colon biopsies - Start Zofran as needed for nausea/vomiting - Continue Imodium as needed for episodes of diarrhea - Attempt to decrease opiate intake - RTC 3 months  All questions were answered.      Abigail Peppers, MD Gastroenterology and Hepatology [facility]

## 2020-10-07 ENCOUNTER — Other Ambulatory Visit (INDEPENDENT_AMBULATORY_CARE_PROVIDER_SITE_OTHER): Payer: Self-pay

## 2020-10-08 ENCOUNTER — Encounter: Payer: Self-pay | Admitting: Family Medicine

## 2020-10-08 ENCOUNTER — Other Ambulatory Visit (HOSPITAL_COMMUNITY)
Admission: RE | Admit: 2020-10-08 | Discharge: 2020-10-08 | Disposition: A | Discharge status: Home / Self Care | Payer: Medicare Other | Source: Ambulatory Visit | Attending: Gastroenterology | Admitting: Gastroenterology

## 2020-10-08 ENCOUNTER — Other Ambulatory Visit: Payer: Self-pay

## 2020-10-08 ENCOUNTER — Encounter (HOSPITAL_COMMUNITY): Payer: Self-pay | Admitting: Gastroenterology

## 2020-10-08 DIAGNOSIS — Z01812 Encounter for preprocedural laboratory examination: Secondary | ICD-10-CM | POA: Insufficient documentation

## 2020-10-08 DIAGNOSIS — Z20822 Contact with and (suspected) exposure to covid-19: Secondary | ICD-10-CM | POA: Diagnosis not present

## 2020-10-08 LAB — SARS CORONAVIRUS 2 (TAT 6-24 HRS): SARS Coronavirus 2: NEGATIVE

## 2020-10-08 MED ORDER — HYDROCODONE-ACETAMINOPHEN 5-325 MG PO TABS: 1.0000 | ORAL_TABLET | Freq: Four times a day (QID) | ORAL | 0 refills | Status: DC | PRN

## 2020-10-08 MED ORDER — ROSUVASTATIN CALCIUM 20 MG PO TABS
20.0000 mg | ORAL_TABLET | Freq: Every day | ORAL | 3 refills | Status: DC
Start: 2020-10-08 — End: 2020-11-09

## 2020-10-08 NOTE — Telephone Encounter (Signed)
Attempted to contact pharmacy. Unable to connect call.   Prescription appears to require refill.  Ok to refill??  Last office visit 07/22/2020.  Last refill 09/09/2020.

## 2020-10-12 ENCOUNTER — Encounter (HOSPITAL_COMMUNITY)
Admission: RE | Disposition: A | Discharge status: Home / Self Care | Payer: Self-pay | Source: Home / Self Care | Attending: Gastroenterology

## 2020-10-12 ENCOUNTER — Encounter (HOSPITAL_COMMUNITY): Payer: Self-pay | Admitting: Gastroenterology

## 2020-10-12 ENCOUNTER — Other Ambulatory Visit: Payer: Self-pay

## 2020-10-12 ENCOUNTER — Ambulatory Visit (HOSPITAL_COMMUNITY): Payer: Medicare Other | Admitting: Anesthesiology

## 2020-10-12 ENCOUNTER — Ambulatory Visit (HOSPITAL_COMMUNITY)
Admission: RE | Admit: 2020-10-12 | Discharge: 2020-10-12 | Disposition: A | Discharge status: Home / Self Care | Payer: Medicare Other | Attending: Gastroenterology | Admitting: Gastroenterology

## 2020-10-12 DIAGNOSIS — Z820 Family history of epilepsy and other diseases of the nervous system: Secondary | ICD-10-CM | POA: Diagnosis not present

## 2020-10-12 DIAGNOSIS — K314 Gastric diverticulum: Secondary | ICD-10-CM | POA: Diagnosis not present

## 2020-10-12 DIAGNOSIS — K529 Noninfective gastroenteritis and colitis, unspecified: Secondary | ICD-10-CM | POA: Diagnosis not present

## 2020-10-12 DIAGNOSIS — I129 Hypertensive chronic kidney disease with stage 1 through stage 4 chronic kidney disease, or unspecified chronic kidney disease: Secondary | ICD-10-CM | POA: Insufficient documentation

## 2020-10-12 DIAGNOSIS — Z818 Family history of other mental and behavioral disorders: Secondary | ICD-10-CM | POA: Insufficient documentation

## 2020-10-12 DIAGNOSIS — Z8543 Personal history of malignant neoplasm of ovary: Secondary | ICD-10-CM | POA: Insufficient documentation

## 2020-10-12 DIAGNOSIS — Z79899 Other long term (current) drug therapy: Secondary | ICD-10-CM | POA: Insufficient documentation

## 2020-10-12 DIAGNOSIS — R112 Nausea with vomiting, unspecified: Secondary | ICD-10-CM | POA: Insufficient documentation

## 2020-10-12 DIAGNOSIS — Z823 Family history of stroke: Secondary | ICD-10-CM | POA: Insufficient documentation

## 2020-10-12 DIAGNOSIS — Q438 Other specified congenital malformations of intestine: Secondary | ICD-10-CM

## 2020-10-12 DIAGNOSIS — Z923 Personal history of irradiation: Secondary | ICD-10-CM | POA: Diagnosis not present

## 2020-10-12 DIAGNOSIS — Z811 Family history of alcohol abuse and dependence: Secondary | ICD-10-CM | POA: Diagnosis not present

## 2020-10-12 DIAGNOSIS — Z86010 Personal history of colonic polyps: Secondary | ICD-10-CM | POA: Diagnosis not present

## 2020-10-12 DIAGNOSIS — Z87442 Personal history of urinary calculi: Secondary | ICD-10-CM | POA: Insufficient documentation

## 2020-10-12 DIAGNOSIS — Z85048 Personal history of other malignant neoplasm of rectum, rectosigmoid junction, and anus: Secondary | ICD-10-CM | POA: Diagnosis not present

## 2020-10-12 DIAGNOSIS — K5730 Diverticulosis of large intestine without perforation or abscess without bleeding: Secondary | ICD-10-CM

## 2020-10-12 DIAGNOSIS — I10 Essential (primary) hypertension: Secondary | ICD-10-CM | POA: Diagnosis not present

## 2020-10-12 DIAGNOSIS — Z8349 Family history of other endocrine, nutritional and metabolic diseases: Secondary | ICD-10-CM | POA: Diagnosis not present

## 2020-10-12 DIAGNOSIS — Z7982 Long term (current) use of aspirin: Secondary | ICD-10-CM | POA: Diagnosis not present

## 2020-10-12 DIAGNOSIS — N189 Chronic kidney disease, unspecified: Secondary | ICD-10-CM | POA: Diagnosis not present

## 2020-10-12 DIAGNOSIS — R197 Diarrhea, unspecified: Secondary | ICD-10-CM | POA: Diagnosis not present

## 2020-10-12 DIAGNOSIS — Z885 Allergy status to narcotic agent status: Secondary | ICD-10-CM | POA: Insufficient documentation

## 2020-10-12 DIAGNOSIS — F17210 Nicotine dependence, cigarettes, uncomplicated: Secondary | ICD-10-CM | POA: Insufficient documentation

## 2020-10-12 DIAGNOSIS — K208 Other esophagitis without bleeding: Secondary | ICD-10-CM | POA: Diagnosis not present

## 2020-10-12 DIAGNOSIS — F39 Unspecified mood [affective] disorder: Secondary | ICD-10-CM | POA: Diagnosis not present

## 2020-10-12 DIAGNOSIS — K2270 Barrett's esophagus without dysplasia: Secondary | ICD-10-CM | POA: Diagnosis not present

## 2020-10-12 DIAGNOSIS — Z8249 Family history of ischemic heart disease and other diseases of the circulatory system: Secondary | ICD-10-CM | POA: Insufficient documentation

## 2020-10-12 DIAGNOSIS — J410 Simple chronic bronchitis: Secondary | ICD-10-CM | POA: Diagnosis not present

## 2020-10-12 DIAGNOSIS — K219 Gastro-esophageal reflux disease without esophagitis: Secondary | ICD-10-CM | POA: Insufficient documentation

## 2020-10-12 HISTORY — PX: ESOPHAGOGASTRODUODENOSCOPY (EGD) WITH PROPOFOL: SHX5813

## 2020-10-12 HISTORY — PX: BIOPSY: SHX5522

## 2020-10-12 HISTORY — PX: COLONOSCOPY WITH PROPOFOL: SHX5780

## 2020-10-12 SURGERY — COLONOSCOPY WITH PROPOFOL
Anesthesia: General

## 2020-10-12 MED ORDER — LIDOCAINE VISCOUS HCL 2 % MT SOLN: 15.0000 mL | Freq: Once | OROMUCOSAL | Status: DC

## 2020-10-12 MED ORDER — GLYCOPYRROLATE 0.2 MG/ML IJ SOLN
INTRAMUSCULAR | Status: AC
  Filled 2020-10-12: qty 1

## 2020-10-12 MED ORDER — GLYCOPYRROLATE 0.2 MG/ML IJ SOLN: 0.2000 mg | Freq: Once | INTRAMUSCULAR | Status: DC

## 2020-10-12 MED ORDER — PROPOFOL 10 MG/ML IV BOLUS
INTRAVENOUS | Status: DC | PRN
  Administered 2020-10-12: 40 mg via INTRAVENOUS
  Administered 2020-10-12: 20 mg via INTRAVENOUS
  Administered 2020-10-12: 40 mg via INTRAVENOUS
  Administered 2020-10-12: 20 mg via INTRAVENOUS

## 2020-10-12 MED ORDER — LACTATED RINGERS IV SOLN: INTRAVENOUS | Status: DC

## 2020-10-12 MED ORDER — LIDOCAINE VISCOUS HCL 2 % MT SOLN
OROMUCOSAL | Status: AC
  Filled 2020-10-12: qty 15

## 2020-10-12 MED ORDER — STERILE WATER FOR IRRIGATION IR SOLN
Status: DC | PRN
  Administered 2020-10-12: 1.5 mL

## 2020-10-12 MED ORDER — PROPOFOL 500 MG/50ML IV EMUL
INTRAVENOUS | Status: DC | PRN
  Administered 2020-10-12: 80 mg via INTRAVENOUS
  Administered 2020-10-12 (×2): 125 ug/kg/min via INTRAVENOUS

## 2020-10-12 NOTE — Op Note (Signed)
[facility] Patient Name: Abigail Wagner Procedure Date: 10/12/2020 9:27 AM MRN: [id number] Date of Birth: [birth year] Attending MD: Maylon Peppers ,  CSN: [id number] Age: 70 Admit Type: Outpatient Procedure:                Colonoscopy Indications:              Clinically significant diarrhea of unexplained                            origin Providers:                Maylon Peppers, Charlsie Quest. Theda Sers RN, RN, Raphael Gibney, Technician Referring MD:              Medicines:                Monitored Anesthesia Care Complications:            No immediate complications. Estimated Blood Loss:     Estimated blood loss: none. Procedure:                Pre-Anesthesia Assessment:                           - Prior to the procedure, a History and Physical                            was performed, and patient medications, allergies                            and sensitivities were reviewed. The patient's                            tolerance of previous anesthesia was reviewed.                           - The risks and benefits of the procedure and the                            sedation options and risks were discussed with the                            patient. All questions were answered and informed                            consent was obtained.                           - ASA Grade Assessment: II - A patient with mild                            systemic disease.                           After obtaining informed consent, the colonoscope  was passed under direct vision. Throughout the                            procedure, the patient's blood pressure, pulse, and                            oxygen saturations were monitored continuously. The                            [id number]-[id number] ([id number]) scope was introduced through                            the anus and advanced to the the cecum, identified                            by appendiceal  orifice and ileocecal valve. The                            colonoscopy was technically difficult and complex                            due to a tortuous colon in sigmoid area. Successful                            completion of the procedure was aided by applying                            abdominal pressure - "sandwich technique" and                            maneuvering. The patient tolerated the procedure                            well. The quality of the bowel preparation was                            excellent. Scope withdrawal time was 14 minutes. Scope In: 9:29:47 AM Scope Out: 10:07:41 AM Scope Withdrawal Time: 0 hours 10 minutes 4 seconds  Total Procedure Duration: 0 hours 37 minutes 54 seconds  Findings:      The perianal exam findings include decreased resting tone but no other       findings.      A few small-mouthed diverticula were found in the sigmoid colon.       Biopsies for histology were taken from the normal colon with a cold       forceps from the right colon and left colon for evaluation of       microscopic colitis.      The sigmoid colon was significantly tortuous.      The retroflexed view of the distal rectum and anal verge was normal and       showed no anal or rectal abnormalities. Impression:               - Decreased resting tone but no other findings  found on perianal exam.                           - Diverticulosis in the sigmoid colon.                           - Normal colon biopsied.                           - Tortuous colon.                           - The distal rectum and anal verge are normal on                            retroflexion view. Moderate Sedation:      Per Anesthesia Care Recommendation:           - Discharge patient to home (ambulatory).                           - Resume previous diet.                           - Await pathology results.                           - Repeat colonoscopy is not  recommended due to                            current age (74 years or older) for screening                            purposes. Procedure Code(s):        --- Professional ---                           [phone], Colonoscopy, flexible; with biopsy, single                            or multiple Diagnosis Code(s):        --- Professional ---                           R19.7, Diarrhea, unspecified                           K57.30, Diverticulosis of large intestine without                            perforation or abscess without bleeding                           Q43.8, Other specified congenital malformations of                            intestine CPT copyright 2019 American Medical Association. All rights reserved. The codes documented in this report are preliminary  and upon coder review may  be revised to meet current compliance requirements. Maylon Peppers, MD Maylon Peppers,  10/12/2020 10:15:11 AM This report has been signed electronically. Number of Addenda: 0

## 2020-10-12 NOTE — Transfer of Care (Signed)
Immediate Anesthesia Transfer of Care Note  Patient: Abigail Wagner  Procedure(s) Performed: COLONOSCOPY WITH PROPOFOL (N/A ) ESOPHAGOGASTRODUODENOSCOPY (EGD) WITH PROPOFOL (N/A ) BIOPSY  Patient Location: Endoscopy Unit  Anesthesia Type:General  Level of Consciousness: sedated and patient cooperative  Airway & Oxygen Therapy: Patient Spontanous Breathing  Post-op Assessment: Report given to RN and Post -op Vital signs reviewed and stable  Post vital signs: Reviewed and stable  Last Vitals:  Vitals Value Taken Time  BP 115/53 10/12/20 1013  Temp    Pulse 71 10/12/20 1013  Resp 18 10/12/20 1013  SpO2 96 % 10/12/20 1013    Last Pain:  Vitals:   10/12/20 1013  TempSrc: Oral  PainSc: 0-No pain         Complications: No complications documented.

## 2020-10-12 NOTE — Anesthesia Postprocedure Evaluation (Signed)
Anesthesia Post Note  Patient: Abigail Wagner  Procedure(s) Performed: COLONOSCOPY WITH PROPOFOL (N/A ) ESOPHAGOGASTRODUODENOSCOPY (EGD) WITH PROPOFOL (N/A ) BIOPSY  Patient location during evaluation: Endoscopy Anesthesia Type: General Level of consciousness: awake and alert and patient cooperative Pain management: satisfactory to patient Vital Signs Assessment: post-procedure vital signs reviewed and stable Respiratory status: spontaneous breathing Cardiovascular status: stable Postop Assessment: no apparent nausea or vomiting Anesthetic complications: no   No complications documented.   Last Vitals:  Vitals:   10/12/20 0826 10/12/20 1013  BP: 127/63 (!) 115/53  Pulse: 78 71  Resp:  18  Temp: 36.6 C   SpO2: 95% 96%    Last Pain:  Vitals:   10/12/20 1013  TempSrc: Oral  PainSc: 0-No pain                 Rayme Bui

## 2020-10-12 NOTE — Op Note (Addendum)
[facility] Patient Name: Abigail Wagner Procedure Date: 10/12/2020 9:07 AM MRN: [id number] Date of Birth: [birth year] Attending MD: Maylon Peppers ,  CSN: [id number] Age: 70 Admit Type: Outpatient Procedure:                Upper GI endoscopy Indications:              Diarrhea, Nausea with vomiting Providers:                Maylon Peppers, Charlsie Quest. Theda Sers RN, RN, Raphael Gibney, Technician Referring MD:              Medicines:                Monitored Anesthesia Care Complications:            No immediate complications. Estimated Blood Loss:     Estimated blood loss: none. Procedure:                Pre-Anesthesia Assessment:                           - Prior to the procedure, a History and Physical                            was performed, and patient medications, allergies                            and sensitivities were reviewed. The patient's                            tolerance of previous anesthesia was reviewed.                           - The risks and benefits of the procedure and the                            sedation options and risks were discussed with the                            patient. All questions were answered and informed                            consent was obtained.                           - ASA Grade Assessment: II - A patient with mild                            systemic disease.                           After obtaining informed consent, the endoscope was                            passed under direct vision. Throughout the  procedure, the patient's blood pressure, pulse, and                            oxygen saturations were monitored continuously. The                            [id number]-[id number]) scope was introduced through the                            mouth, and advanced to the second part of duodenum.                            The upper GI endoscopy was accomplished without                             difficulty. The patient tolerated the procedure                            well. Scope In: 9:10:15 AM Scope Out: 9:22:28 AM Total Procedure Duration: 0 hours 12 minutes 13 seconds  Findings:      The esophagus and gastroesophageal junction were examined with white       light and narrow band imaging (NBI). There were esophageal mucosal       changes suspicious for short-segment Barrett's esophagus, classified as       Barrett's stage C0-M2 per Prague criteria. These changes involved the       mucosa at the upper extent of the gastric folds (44 cm from the       incisors) extending to the Z-line (42 cm from the incisors). Two tongues       of salmon-colored mucosa were present from 42 to 44 cm. The maximum       longitudinal extent of these esophageal mucosal changes was 2 cm in       length. This was biopsied with a cold forceps for histology.      The exam of the esophagus was otherwise normal.      A large non-bleeding diverticulum was found in the gastric fundus.      The examined duodenum was normal. Biopsies for histology were taken with       a cold forceps for evaluation of celiac disease. Impression:               - Esophageal mucosal changes suspicious for                            short-segment Barrett's esophagus, classified as                            Barrett's stage C0-M2 per Prague criteria. Biopsied.                           - Gastric diverticulum. This could be seen in                            patients with delayed gastric emptying.                           -  Normal examined duodenum. Biopsied. Moderate Sedation:      Per Anesthesia Care Recommendation:           - Discharge patient to home (ambulatory).                           - Resume previous diet.                           - Await pathology results.                           - Continue present medications. Procedure Code(s):        --- Professional ---                           [id number], GC,  Esophagogastroduodenoscopy, flexible,                            transoral; with biopsy, single or multiple Diagnosis Code(s):        --- Professional ---                           K22.70, Barrett's esophagus without dysplasia                           K31.4, Gastric diverticulum                           R19.7, Diarrhea, unspecified                           R11.2, Nausea with vomiting, unspecified CPT copyright 2019 American Medical Association. All rights reserved. The codes documented in this report are preliminary and upon coder review may  be revised to meet current compliance requirements. Maylon Peppers, MD Maylon Peppers,  10/12/2020 9:28:18 AM This report has been signed electronically. Number of Addenda: 0

## 2020-10-12 NOTE — Interval H&P Note (Signed)
History and Physical Interval Note:  10/12/2020 9:03 AM 71 y.o. 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  Patient states that since she started taking Zofran her nausea has much more improved and has been tolerating more food.  Denies any vomiting.  States the diarrhea has also slowed down and has not presented any more constipation, is having more regular bowel movements.  Denies any melena, hematochezia.  Notably, she is still having abdominal pain in her mid abdomen.  BP 127/63   Pulse 78   Temp 97.8 F (36.6 C) (Oral)   Ht 5\' 5"  (1.651 m)   SpO2 95%   BMI 18.48 kg/m  GENERAL: The patient is AO x3, in no acute distress. Underweight.  HEENT: Head is normocephalic and atraumatic. EOMI are intact. Mouth is well hydrated and without lesions. NECK: Supple. No masses LUNGS: Clear to auscultation. No presence of rhonchi/wheezing/rales. Adequate chest expansion HEART: RRR, normal s1 and s2. ABDOMEN: Soft, nontender, no guarding, no peritoneal signs, and nondistended. BS +. No masses. EXTREMITIES: Without any cyanosis, clubbing, rash, lesions or edema. NEUROLOGIC: AOx3, no focal motor deficit. SKIN: no jaundice, no rashes   ERVIN ROTHBAUER  has presented today for surgery, with the diagnosis of Chronic diarrhea/Nausea/Vomiting.  The various methods of treatment have been discussed with the patient and family. After consideration of risks, benefits and other options for treatment, the patient has consented to  Procedure(s) with comments: COLONOSCOPY WITH PROPOFOL (N/A) - 9:00 ESOPHAGOGASTRODUODENOSCOPY (EGD) WITH PROPOFOL (N/A) as a surgical intervention.  The patient's history has been reviewed, patient examined, no change in status, stable for surgery.  I have reviewed the  patient's chart and labs.  Questions were answered to the patient's satisfaction.     Maylon Peppers Mayorga

## 2020-10-12 NOTE — Anesthesia Preprocedure Evaluation (Addendum)
Anesthesia Evaluation  Patient identified by MRN, date of birth, ID band Patient awake    Reviewed: Allergy & Precautions, NPO status , Patient's Chart, lab work & pertinent test results  History of Anesthesia Complications Negative for: history of anesthetic complications  Airway Mallampati: II  TM Distance: >3 FB Neck ROM: Full   Comment: Neck pain  Dental  (+) Dental Advisory Given, Teeth Intact, Chipped,  Crowns :   Pulmonary Current Smoker,  Respiratory failure from drug overdose    Pulmonary exam normal breath sounds clear to auscultation       Cardiovascular Exercise Tolerance: Poor hypertension, Pt. on medications Normal cardiovascular exam Rhythm:Regular Rate:Normal     Neuro/Psych  Headaches, PSYCHIATRIC DISORDERS Anxiety Depression Bipolar Disorder    GI/Hepatic GERD  Medicated,(+)     substance abuse  , Anal cancer    Endo/Other  negative endocrine ROS  Renal/GU Renal disease   Ovarian cancer     Musculoskeletal  (+) Arthritis  (back pain),   Abdominal   Peds  Hematology negative hematology ROS (+) anemia ,   Anesthesia Other Findings   Reproductive/Obstetrics                            Anesthesia Physical Anesthesia Plan  ASA: III  Anesthesia Plan: General   Post-op Pain Management:    Induction: Intravenous  PONV Risk Score and Plan:   Airway Management Planned: Natural Airway and Nasal Cannula  Additional Equipment:   Intra-op Plan:   Post-operative Plan:   Informed Consent: I have reviewed the patients History and Physical, chart, labs and discussed the procedure including the risks, benefits and alternatives for the proposed anesthesia with the patient or authorized representative who has indicated his/her understanding and acceptance.     Dental advisory given  Plan Discussed with: CRNA and Surgeon  Anesthesia Plan Comments:          Anesthesia Quick Evaluation

## 2020-10-12 NOTE — Discharge Instructions (Signed)
You are being discharged to home.  Resume your previous diet.  We are waiting for your pathology results.  Continue your present medications.  Your physician has indicated that a repeat colonoscopy is not recommended due to your current age (70 years or older) for screening purposes.    Upper Endoscopy, Adult, Care After This sheet gives you information about how to care for yourself after your procedure. Your health care provider may also give you more specific instructions. If you have problems or questions, contact your health care provider. What can I expect after the procedure? After the procedure, it is common to have:  A sore throat.  Mild stomach pain or discomfort.  Bloating.  Nausea. Follow these instructions at home:  Follow instructions from your health care provider about what to eat or drink after your procedure.  Return to your normal activities as told by your health care provider. Ask your health care provider what activities are safe for you.  Take over-the-counter and prescription medicines only as told by your health care provider.  If you were given a sedative during the procedure, it can affect you for several hours. Do not drive or operate machinery until your health care provider says that it is safe.  Keep all follow-up visits as told by your health care provider. This is important.   Contact a health care provider if you have:  A sore throat that lasts longer than one day.  Trouble swallowing. Get help right away if:  You vomit blood or your vomit looks like coffee grounds.  You have: ? A fever. ? Bloody, black, or tarry stools. ? A severe sore throat or you cannot swallow. ? Difficulty breathing. ? Severe pain in your chest or abdomen. Summary  After the procedure, it is common to have a sore throat, mild stomach discomfort, bloating, and nausea.  If you were given a sedative during the procedure, it can affect you for several hours. Do not  drive or operate machinery until your health care provider says that it is safe.  Follow instructions from your health care provider about what to eat or drink after your procedure.  Return to your normal activities as told by your health care provider. This information is not intended to replace advice given to you by your health care provider. Make sure you discuss any questions you have with your health care provider. Document Revised: 09/09/2019 Document Reviewed: 02/11/2018 Elsevier Patient Education  2021 [employer].  Colonoscopy, Adult, Care After This sheet gives you information about how to care for yourself after your procedure. Your doctor may also give you more specific instructions. If you have problems or questions, call your doctor. What can I expect after the procedure? After the procedure, it is common to have:  A small amount of blood in your poop (stool) for 24 hours.  Some gas.  Mild cramping or bloating in your belly (abdomen). Follow these instructions at home: Eating and drinking  Drink enough fluid to keep your pee (urine) pale yellow.  Follow instructions from your doctor about what you cannot eat or drink.  Return to your normal diet as told by your doctor. Avoid heavy or fried foods that are hard to digest.   Activity  Rest as told by your doctor.  Do not sit for a long time without moving. Get up to take short walks every 1-2 hours. This is important. Ask for help if you feel weak or unsteady.  Return to your  normal activities as told by your doctor. Ask your doctor what activities are safe for you. To help cramping and bloating:  Try walking around.  Put heat on your belly as told by your doctor. Use the heat source that your doctor recommends, such as a moist heat pack or a heating pad. ? Put a towel between your skin and the heat source. ? Leave the heat on for 20-30 minutes. ? Remove the heat if your skin turns bright red. This is very  important if you are unable to feel pain, heat, or cold. You may have a greater risk of getting burned.   General instructions  If you were given a medicine to help you relax (sedative) during your procedure, it can affect you for many hours. Do not drive or use machinery until your doctor says that it is safe.  For the first 24 hours after the procedure: ? Do not sign important documents. ? Do not drink alcohol. ? Do your daily activities more slowly than normal. ? Eat foods that are soft and easy to digest.  Take over-the-counter or prescription medicines only as told by your doctor.  Keep all follow-up visits as told by your doctor. This is important. Contact a doctor if:  You have blood in your poop 2-3 days after the procedure. Get help right away if:  You have more than a small amount of blood in your poop.  You see large clumps of tissue (blood clots) in your poop.  Your belly is swollen.  You feel like you may vomit (nauseous).  You vomit.  You have a fever.  You have belly pain that gets worse, and medicine does not help your pain. Summary  After the procedure, it is common to have a small amount of blood in your poop. You may also have mild cramping and bloating in your belly.  If you were given a medicine to help you relax (sedative) during your procedure, it can affect you for many hours. Do not drive or use machinery until your doctor says that it is safe.  Get help right away if you have a lot of blood in your poop, feel like you may vomit, have a fever, or have more belly pain. This information is not intended to replace advice given to you by your health care provider. Make sure you discuss any questions you have with your health care provider. Document Revised: 07/18/2019 Document Reviewed: 04/07/2019 Elsevier Patient Education  2021 [place].  Diverticulosis  Diverticulosis is a condition that develops when small pouches (diverticula) form in the  wall of the large intestine (colon). The colon is where water is absorbed and stool (feces) is formed. The pouches form when the inside layer of the colon pushes through weak spots in the outer layers of the colon. You may have a few pouches or many of them. The pouches usually do not cause problems unless they become inflamed or infected. When this happens, the condition is called diverticulitis. What are the causes? The cause of this condition is not known. What increases the risk? The following factors may make you more likely to develop this condition:  Being older than age 68. Your risk for this condition increases with age. Diverticulosis is rare among people younger than age 73. By age 33, many people have it.  Eating a low-fiber diet.  Having frequent constipation.  Being overweight.  Not getting enough exercise.  Smoking.  Taking over-the-counter pain medicines, like aspirin and  ibuprofen.  Having a family history of diverticulosis. What are the signs or symptoms? In most people, there are no symptoms of this condition. If you do have symptoms, they may include:  Bloating.  Cramps in the abdomen.  Constipation or diarrhea.  Pain in the lower left side of the abdomen. How is this diagnosed? Because diverticulosis usually has no symptoms, it is most often diagnosed during an exam for other colon problems. The condition may be diagnosed by:  Using a flexible scope to examine the colon (colonoscopy).  Taking an X-ray of the colon after dye has been put into the colon (barium enema).  Having a CT scan. How is this treated? You may not need treatment for this condition. Your health care provider may recommend treatment to prevent problems. You may need treatment if you have symptoms or if you previously had diverticulitis. Treatment may include:  Eating a high-fiber diet.  Taking a fiber supplement.  Taking a live bacteria supplement (probiotic).  Taking medicine  to relax your colon.   Follow these instructions at home: Medicines  Take over-the-counter and prescription medicines only as told by your health care provider.  If told by your health care provider, take a fiber supplement or probiotic. Constipation prevention Your condition may cause constipation. To prevent or treat constipation, you may need to:  Drink enough fluid to keep your urine pale yellow.  Take over-the-counter or prescription medicines.  Eat foods that are high in fiber, such as beans, whole grains, and fresh fruits and vegetables.  Limit foods that are high in fat and processed sugars, such as fried or sweet foods.   General instructions  Try not to strain when you have a bowel movement.  Keep all follow-up visits as told by your health care provider. This is important. Contact a health care provider if you:  Have pain in your abdomen.  Have bloating.  Have cramps.  Have not had a bowel movement in 3 days. Get help right away if:  Your pain gets worse.  Your bloating becomes very bad.  You have a fever or chills, and your symptoms suddenly get worse.  You vomit.  You have bowel movements that are bloody or black.  You have bleeding from your rectum. Summary  Diverticulosis is a condition that develops when small pouches (diverticula) form in the wall of the large intestine (colon).  You may have a few pouches or many of them.  This condition is most often diagnosed during an exam for other colon problems.  Treatment may include increasing the fiber in your diet, taking supplements, or taking medicines. This information is not intended to replace advice given to you by your health care provider. Make sure you discuss any questions you have with your health care provider. Document Revised: 04/10/2019 Document Reviewed: 04/10/2019 Elsevier Patient Education  [place].

## 2020-10-12 NOTE — Anesthesia Procedure Notes (Signed)
Date/Time: 10/12/2020 9:05 AM Performed by: Vista Deck, CRNA Pre-anesthesia Checklist: Patient identified, Emergency Drugs available, Suction available, Timeout performed and Patient being monitored Patient Re-evaluated:Patient Re-evaluated prior to induction Oxygen Delivery Method: Nasal Cannula

## 2020-10-13 LAB — SURGICAL PATHOLOGY

## 2020-10-18 ENCOUNTER — Encounter (HOSPITAL_COMMUNITY): Payer: Self-pay | Admitting: Gastroenterology

## 2020-11-09 ENCOUNTER — Encounter: Payer: Self-pay | Admitting: Family Medicine

## 2020-11-09 MED ORDER — ROSUVASTATIN CALCIUM 20 MG PO TABS: 20.0000 mg | ORAL_TABLET | Freq: Every day | ORAL | 3 refills | Status: DC

## 2020-11-09 MED ORDER — HYDROCODONE-ACETAMINOPHEN 5-325 MG PO TABS: 1.0000 | ORAL_TABLET | Freq: Four times a day (QID) | ORAL | 0 refills | Status: DC | PRN

## 2020-11-09 NOTE — Telephone Encounter (Signed)
Ok to refill Hydrocodone/APAP??   Last office visit 07/22/2020.  Last refill 09/28/2020.

## 2020-12-06 ENCOUNTER — Telehealth: Payer: Self-pay

## 2020-12-06 ENCOUNTER — Other Ambulatory Visit: Payer: Self-pay | Admitting: Family Medicine

## 2020-12-06 MED ORDER — HYDROCODONE-ACETAMINOPHEN 5-325 MG PO TABS: 1.0000 | ORAL_TABLET | Freq: Four times a day (QID) | ORAL | 0 refills | Status: DC | PRN

## 2020-12-06 NOTE — Telephone Encounter (Signed)
Please send hydrocodone refill to the pharmacy

## 2020-12-06 NOTE — Telephone Encounter (Signed)
Please advise 

## 2020-12-14 ENCOUNTER — Inpatient Hospital Stay (HOSPITAL_COMMUNITY): Payer: Medicare Other | Attending: Hematology

## 2020-12-14 ENCOUNTER — Other Ambulatory Visit: Payer: Self-pay

## 2020-12-14 DIAGNOSIS — K5790 Diverticulosis of intestine, part unspecified, without perforation or abscess without bleeding: Secondary | ICD-10-CM | POA: Insufficient documentation

## 2020-12-14 DIAGNOSIS — F17210 Nicotine dependence, cigarettes, uncomplicated: Secondary | ICD-10-CM | POA: Diagnosis not present

## 2020-12-14 DIAGNOSIS — D649 Anemia, unspecified: Secondary | ICD-10-CM | POA: Diagnosis not present

## 2020-12-14 DIAGNOSIS — Z9221 Personal history of antineoplastic chemotherapy: Secondary | ICD-10-CM | POA: Insufficient documentation

## 2020-12-14 DIAGNOSIS — Z85048 Personal history of other malignant neoplasm of rectum, rectosigmoid junction, and anus: Secondary | ICD-10-CM | POA: Insufficient documentation

## 2020-12-14 DIAGNOSIS — Z87442 Personal history of urinary calculi: Secondary | ICD-10-CM | POA: Insufficient documentation

## 2020-12-14 DIAGNOSIS — Z923 Personal history of irradiation: Secondary | ICD-10-CM | POA: Insufficient documentation

## 2020-12-14 DIAGNOSIS — Z8543 Personal history of malignant neoplasm of ovary: Secondary | ICD-10-CM | POA: Insufficient documentation

## 2020-12-14 DIAGNOSIS — R197 Diarrhea, unspecified: Secondary | ICD-10-CM | POA: Diagnosis not present

## 2020-12-14 DIAGNOSIS — C210 Malignant neoplasm of anus, unspecified: Secondary | ICD-10-CM

## 2020-12-14 LAB — COMPREHENSIVE METABOLIC PANEL
ALT: 21 U/L (ref 0–44)
AST: 26 U/L (ref 15–41)
Albumin: 3.8 g/dL (ref 3.5–5.0)
Alkaline Phosphatase: 68 U/L (ref 38–126)
Anion gap: 10 (ref 5–15)
BUN: 19 mg/dL (ref 8–23)
CO2: 26 mmol/L (ref 22–32)
Calcium: 8.8 mg/dL — ABNORMAL LOW (ref 8.9–10.3)
Chloride: 101 mmol/L (ref 98–111)
Creatinine, Ser: 0.56 mg/dL (ref 0.44–1.00)
GFR, Estimated: >60 mL/min (ref >60)
Glucose, Bld: 99 mg/dL (ref 70–99)
Potassium: 4.1 mmol/L (ref 3.5–5.1)
Sodium: 137 mmol/L (ref 135–145)
Total Bilirubin: 0.2 mg/dL — ABNORMAL LOW (ref 0.3–1.2)
Total Protein: 6.8 g/dL (ref 6.5–8.1)

## 2020-12-14 LAB — CBC WITH DIFFERENTIAL/PLATELET
Band Neutrophils: 2 %
Basophils Absolute: 0 10*3/uL (ref 0.0–0.1)
Basophils Relative: 0 %
Eosinophils Absolute: 0.1 10*3/uL (ref 0.0–0.5)
Eosinophils Relative: 1 %
HCT: 35.2 % — ABNORMAL LOW (ref 36.0–46.0)
Hemoglobin: 11.8 g/dL — ABNORMAL LOW (ref 12.0–15.0)
Lymphocytes Relative: 41 %
Lymphs Abs: 2.2 10*3/uL (ref 0.7–4.0)
MCH: 32.4 pg (ref 26.0–34.0)
MCHC: 33.5 g/dL (ref 30.0–36.0)
MCV: 96.7 fL (ref 80.0–100.0)
Monocytes Absolute: 0.3 10*3/uL (ref 0.1–1.0)
Monocytes Relative: 6 %
Neutro Abs: 2.8 10*3/uL (ref 1.7–7.7)
Neutrophils Relative %: 50 %
Platelets: 188 10*3/uL (ref 150–400)
RBC: 3.64 MIL/uL — ABNORMAL LOW (ref 3.87–5.11)
RDW: 15.6 % — ABNORMAL HIGH (ref 11.5–15.5)
WBC: 5.3 10*3/uL (ref 4.0–10.5)
nRBC: 0 % (ref 0.0–0.2)

## 2020-12-14 MED ORDER — SODIUM CHLORIDE 0.9% FLUSH
10.0000 mL | Freq: Once | INTRAVENOUS | Status: AC
  Administered 2020-12-14: 10 mL via INTRAVENOUS

## 2020-12-14 MED ORDER — HEPARIN SOD (PORK) LOCK FLUSH 100 UNIT/ML IV SOLN
500.0000 [IU] | Freq: Once | INTRAVENOUS | Status: AC
  Administered 2020-12-14: 500 [IU] via INTRAVENOUS

## 2020-12-14 NOTE — Progress Notes (Signed)
PEACE NOYES tolerated portacath access and flush well without complaints.Good blood return noted, portacath flushed with H 20 needle. Needle was removed and band aid was applied. Pt discharged ambulatory in satisfactory condition.

## 2020-12-15 LAB — CA 125: Cancer Antigen (CA) 125: 11.3 U/mL (ref 0.0–38.1)

## 2020-12-21 ENCOUNTER — Other Ambulatory Visit: Payer: Self-pay

## 2020-12-21 ENCOUNTER — Inpatient Hospital Stay (HOSPITAL_COMMUNITY): Payer: Medicare Other | Admitting: Hematology

## 2020-12-21 VITALS — BP 118/62 | HR 81 | Temp 97.0°F | Resp 16 | Wt 111.6 lb

## 2020-12-21 DIAGNOSIS — K5790 Diverticulosis of intestine, part unspecified, without perforation or abscess without bleeding: Secondary | ICD-10-CM | POA: Diagnosis not present

## 2020-12-21 DIAGNOSIS — Z9221 Personal history of antineoplastic chemotherapy: Secondary | ICD-10-CM | POA: Diagnosis not present

## 2020-12-21 DIAGNOSIS — C210 Malignant neoplasm of anus, unspecified: Secondary | ICD-10-CM

## 2020-12-21 DIAGNOSIS — F17210 Nicotine dependence, cigarettes, uncomplicated: Secondary | ICD-10-CM | POA: Diagnosis not present

## 2020-12-21 DIAGNOSIS — C569 Malignant neoplasm of unspecified ovary: Secondary | ICD-10-CM

## 2020-12-21 DIAGNOSIS — D649 Anemia, unspecified: Secondary | ICD-10-CM | POA: Diagnosis not present

## 2020-12-21 DIAGNOSIS — Z87442 Personal history of urinary calculi: Secondary | ICD-10-CM | POA: Diagnosis not present

## 2020-12-21 DIAGNOSIS — R197 Diarrhea, unspecified: Secondary | ICD-10-CM | POA: Diagnosis not present

## 2020-12-21 DIAGNOSIS — Z85048 Personal history of other malignant neoplasm of rectum, rectosigmoid junction, and anus: Secondary | ICD-10-CM | POA: Diagnosis not present

## 2020-12-21 DIAGNOSIS — Z923 Personal history of irradiation: Secondary | ICD-10-CM | POA: Diagnosis not present

## 2020-12-21 NOTE — Progress Notes (Signed)
Abigail Wagner, [place] [address]   CLINIC:  Medical Oncology/Hematology  PCP:  Susy Frizzle, MD [address] [place] [address] [phone]   REASON FOR VISIT:  Follow-up for anal cancer and ovarian cancer  PRIOR THERAPY:  1. Debulking surgery with BSO on 07/04/2014. 2. Chemoradiation therapy with 5-FU and mitomycin completed on 12/06/2015.  NGS Results: Not done  CURRENT THERAPY: Surveillance  BRIEF ONCOLOGIC HISTORY:  Oncology History  History of ovarian cancer  06/02/2014 Procedure   Diagnostic laparoscopy, lysis of adhesions, biopsy of omental nodules, biopsy right ovary by Dr Dalbert Batman   06/04/2014 Pathology Results   Diagnosis 1. Omentum, biopsy, Anterior peritoneal & omental nodule - SEROUS CARCINOMA WITH ASSOCIATED ABUNDANT PSAMMOMA BODIES AND DESMOPLASTIC STROMAL REACTION CONSISTENT WITH INVASIVE IMPLANTS. - SEE COMMENT. 2. Omentum, biopsy, Omental nodule - SEROUS CARCINOMA WITH ASSOCIATED ABUNDANT PSAMMOMA BODIES AND DESMOPLASTIC STROMAL REACTION CONSISTENT WITH INVASIVE IMPLANTS. - SEE COMMENT. 3. Ovary, biopsy/wedge resection, Right ovary - SMALL FRAGMENTS OF ATYPICAL PAPILLARY PROLIFERATION WITH PSAMMOMA BODIES CONSISTENT WITH AT LEAST SEROUS BORDERLINE TUMOR.   07/03/2014 Procedure   Exploratory laparotomy, omentectomy, Bilateral salpingo-oophorectomy, inptraperitoneal PORT placement by Dr. Alycia Rossetti       07/24/2014 - 12/07/2014 Chemotherapy   Carboplatin/Paclitaxel x 6 cycles    08/17/2014 Procedure   Insertion of 8 French power port clear view tunneled venous vascular access device next line use of fluoroscopy for guidance and positioning Use of ultrasound for venipuncture Removal of intraperitoneal chemotherapy port By Dr. Dalbert Batman   05/14/2017 Genetic Testing   Negative genetic testing on the [facility] panel.  The [facility] gene panel offered by [employer] includes sequencing and  deletion/duplication testing of the following 28 genes: APC, ATM, BARD1, BMPR1A, BRCA1, BRCA2, BRIP1, CHD1, CDK4, CDKN2A, CHEK2, EPCAM (large rearrangement only), MLH1, MSH2, MSH6, MUTYH, NBN, PALB2, PMS2, PTEN, RAD51C, RAD51D, SMAD4, STK11, and TP53. Sequencing was performed for select regions of POLE and POLD1, and large rearrangement analysis was performed for select regions of GREM1. The report date is April 27, 2017.  HRD testing was performed on the ovarian tumor.  The tumor was negative for any deleterious BRCA1 or BRCA2 mutations.    Genomic instability status was not interpretable.  Therefore, [facility] was unable to analyze the ovarian tumor for genomic instability.  The report date is May 14, 2017.    Anal cancer ([place])  09/09/2015 Procedure   Rectovaginal septal mass biopsy by Dr. Marcello Moores   09/10/2015 Pathology Results   Soft tissue mass, biopsy, rectovaginal septal mass - SQUAMOUS CELL CARCINOMA.   10/15/2015 PET scan   1. Focal hypermetabolism with associated ill-defined soft tissue fullness at the junction of the anterior anal wall and posterior inferior vaginal wall, in keeping with the provided history of a primary anal carcinoma. 2. No hypermetabolic locoregional or distant metastatic disease. 3. Status post hysterectomy, with no abnormal findings at the vaginal cuff. No evidence of hypermetabolic peritoneal tumor. 4. Tiny nonobstructing bilateral renal stones.   10/25/2015 - 12/06/2015 Radiation Therapy   [place], [place]   10/25/2015 - 12/06/2015 Chemotherapy   Mitomycin C and 5FU, by Dr. Jacquiline Doe    06/15/2016 Procedure   EXCISION RECTOVAGINAOL FISTULA WITH MUCOSAL ADVANCEMENT FLAP by Dr. Marcello Moores   06/18/2016 Pathology Results   Fistula, rectovaginal SQUAMOUS, COLUMNAR JUNCTION MUCOSA AND SUBCUTANOUS SOFT TISSUE WITH FISTULA NO EVIDENCE OF MALIGNANCY   09/20/2016 Imaging   CT CAP- 1. No acute findings and no  evidence for recurrent tumor or metastatic disease. 2.  Aortic atherosclerosis and coronary artery calcification     CANCER STAGING: Cancer Staging Anal cancer ([place]) Staging form: Anus, AJCC 8th Edition - Clinical: Stage IIA (cT2, cN0, cM0) - Signed by Baird [facility], PA-C on 09/27/2016   INTERVAL HISTORY:  Abigail Wagner, a 70 y.o. female, returns for routine follow-up of her ana cancer and ovarian cancer. Abigail Wagner was last seen on 06/16/2020.   Today she reports feeling okay. She had her colonoscopy with Dr. Jenetta Downer on 01/18. She complains of having diarrhea from her diverticulosis and also notes having occasional abdominal bloating, but denies having constipation. She stopped taking iron tablets in the end of 2021 since it was contributing to her constipation, but denies having hematochezia.   REVIEW OF SYSTEMS:  Review of Systems  Constitutional: Positive for appetite change (75%) and fatigue (50%).  Gastrointestinal: Positive for abdominal distention (occasional) and diarrhea. Negative for blood in stool and constipation.  All other systems reviewed and are negative.   PAST MEDICAL/SURGICAL HISTORY:  Past Medical History:  Diagnosis Date  . Allergy   . Anal carcinoma [facility]) oncologist-  dr Whitney Muse ([facility])   dx 12/ 2016 [facility] --  chemo and radiation- Dr. Marcello Moores  . Anemia    due to her cancer  . Anxiety    Dr. Harrington Challenger at [facility] (psychiatrist).    . Carcinoma of ovary, stage 3 [facility]) oncologist-  dr gehrig/  dr Doreene Eland (cancer center in [place] [facility])--  no recurrency   dx 09/ 2015  Stage IIIB  papillary ovarian carcinoma  s/p  omentectomy and BSO &  chemotherapy (completed 12-07-2014)  . Chemotherapy induced nausea and vomiting 10/23/2014  . Chronic kidney disease    stones  . DDD (degenerative disc disease), lumbosacral   . GERD (gastroesophageal reflux disease)   . Headache(784.0)   . History of acute respiratory failure    05-30-2009  drug overdose-- (intubated for 2 days)  and aspiration pneumonia  . History  of adenomatous polyp of colon 10/07/2015   tubular adenoma high grade dysplasia  . History of herpes genitalis   . History of kidney stones   . History of ovarian cancer 08/03/2015  . History of suicide attempt    per documentation in epic  05-30-2009  overdose benaodiazepine  . HTN (hypertension)    takes Metoprolol daily  . Hyperlipidemia   . Hypertension 09/27/2016  . IBS (irritable bowel syndrome)    takes Bentyl daily  . Internal carotid artery stenosis, left    <50%, ulcerative plaque at carotid bulb.   . Major depression   . Mood disorder ([place])   . OA (osteoarthritis)    both hips  . Ovarian cancer ([place])    2015-TAH/BSO  . Prolapse of vaginal vault after hysterectomy 07/20/2015  . Rectovaginal fistula 08/05/2015  . Sigmoid diverticulosis   . Smokers' cough [facility])    Past Surgical History:  Procedure Laterality Date  . BIOPSY  10/12/2020   Procedure: BIOPSY;  Surgeon: Harvel Quale, MD;  [facility];  Service: Gastroenterology;;  . [place]  . [place]  . COLONOSCOPY N/A 10/07/2015   Procedure: COLONOSCOPY;  Surgeon: Rogene Houston, MD;  [facility];  Service: Endoscopy;  Laterality: N/A;  7:30  . COLONOSCOPY WITH PROPOFOL N/A 10/12/2020   Procedure: COLONOSCOPY WITH PROPOFOL;  Surgeon: Harvel Quale, MD;  [facility];  Service: Gastroenterology;  Laterality: N/A;  9:00  . ESOPHAGOGASTRODUODENOSCOPY (EGD) WITH PROPOFOL N/A 10/12/2020   Procedure: ESOPHAGOGASTRODUODENOSCOPY (EGD) WITH PROPOFOL;  Surgeon: Harvel Quale, MD;  [facility];  Service: Gastroenterology;  Laterality: N/A;  . EXPLORATORY LAPAROTOMY/ OMENTECTOMY/  BILATERAL SALPINGOOPHORECTOMY/  PORT-A-CATH PLACEMENT  07-03-2014   [facility]  . KNEE ARTHROSCOPY Left 09-22-2004  . LAPAROSCOPY N/A 06/02/2014   Procedure: DIAGNOSTIC LAPAROSCOPY, OMENTAL BIOPSY, RIGHT OVARY BIOPSY, LYSIS OF ADHESIONS;   Surgeon: Fanny Skates, MD;  Location: [place];  Service: General;  Laterality: N/A;  . MUCOSAL ADVANCEMENT FLAP N/A 06/15/2016   Procedure: EXCISION RECTOVAGINAOL FISTULA WITH MUCOSAL ADVANCEMENT FLAP;  Surgeon: Leighton Ruff, MD;  Location: [place];  Service: General;  Laterality: N/A;  . PLACEMENT OF SETON N/A 09/09/2015   Procedure: PLACEMENT OF SETON;  Surgeon: Leighton Ruff, MD;  Location: [place];  Service: General;  Laterality: N/A;  . PORT-A-CATH REMOVAL Right 08/17/2014   Procedure: REMOVAL INTRAPERITONEAL CHEMO PORT;  Surgeon: Fanny Skates, MD;  Location: [place];  Service: General;  Laterality: Right;  . PORTACATH PLACEMENT Right 08/17/2014   Procedure:  PLACE NEW PORT A CATH;  Surgeon: Fanny Skates, MD;  Location: [place];  Service: General;  Laterality: Right;  . RECTAL BIOPSY N/A 09/09/2015   Procedure: BIOPSY OF RECTOVAGINAL MASS;  Surgeon: Leighton Ruff, MD;  Location: [place];  Service: General;  Laterality: N/A;  . TUBAL LIGATION  YRS AGO  . VAGINAL HYSTERECTOMY  1981   fibroids    SOCIAL HISTORY:  Social History   Socioeconomic History  . Marital status: Married    Spouse name: Coralyn Mark  . Number of children: 3  . Years of education: 66  . Highest education level: Not on file  Occupational History  . Occupation: retire    Comment: [employer]  Tobacco Use  . Smoking status: Current Every Day Smoker    Packs/day: 0.50    Years: 43.00    Pack years: 21.50    Types: Cigarettes  . Smokeless tobacco: Never Used  Vaping Use  . Vaping Use: Never used  Substance and Sexual Activity  . Alcohol use: No  . Drug use: No  . Sexual activity: Not Currently    Birth control/protection: Surgical    Comment: hyst  Other Topics Concern  . Not on file  Social History Narrative   Lives at home with Coralyn Mark   Retired [place]  Strain: Not on file  Food Insecurity: Not on file  Transportation Needs: Not on file  Physical Activity: Not on file  Stress: Not on file  Social Connections: Not on file  Intimate Partner Violence: Not on file    FAMILY HISTORY:  Family History  Problem Relation Age of Onset  . Bipolar disorder Mother   . Anxiety disorder Mother   . Dementia Mother   . Depression Mother   . Mental illness Mother   . Vision loss Mother   . Alzheimer's disease Mother   . Alcohol abuse Paternal Uncle   . Bipolar disorder Maternal Grandmother   . Dementia Maternal Grandmother   . Alzheimer's disease Maternal Grandmother   . Alcohol abuse Paternal Uncle   . Stroke Brother   . Deep vein thrombosis Son   . Heart disease Father   . Hyperlipidemia Father   . Hypertension Father   . Stroke Father   .  Vision loss Father   . Atrial fibrillation Sister   . Heart disease Brother   . Heart disease Brother   . ADD / ADHD Neg Hx   . Drug abuse Neg Hx   . OCD Neg Hx   . Paranoid behavior Neg Hx   . Schizophrenia Neg Hx   . Seizures Neg Hx   . Sexual abuse Neg Hx   . Physical abuse Neg Hx     CURRENT MEDICATIONS:  Current Outpatient Medications  Medication Sig Dispense Refill  . b complex vitamins tablet Take 1 tablet by mouth daily.    . Cholecalciferol (VITAMIN D-3) 125 MCG (5000 UT) TABS Take 5,000 Units by mouth daily.    . Coenzyme Q10 (CO Q 10) 100 MG CAPS Take 100 mg by mouth daily.    . divalproex (DEPAKOTE ER) 500 MG 24 hr tablet Take 1 tablet (500 mg total) by mouth daily. 90 tablet 3  . docusate sodium (COLACE) 100 MG capsule Take 1 capsule (100 mg total) by mouth daily as needed for moderate constipation. 30 capsule 2  . escitalopram (LEXAPRO) 20 MG tablet Take 1 tablet (20 mg total) by mouth 2 (two) times daily. 180 tablet 2  . Ferrous Sulfate 142 (45 Fe) MG TBCR Take 142 mg by mouth daily.    Marland Kitchen HYDROcodone-acetaminophen (NORCO) 5-325 MG tablet Take 1 tablet by mouth every 6 (six)  hours as needed for moderate pain. 30 tablet 0  . KRILL OIL PO Take 350 mg by mouth daily.    Marland Kitchen LORazepam (ATIVAN) 1 MG tablet Take 1 tablet (1 mg total) by mouth 3 (three) times daily as needed for anxiety. 90 tablet 5  . losartan (COZAAR) 50 MG tablet TAKE 1 TABLET BY MOUTH EVERY DAY (Patient taking differently: Take 50 mg by mouth daily.) 90 tablet 3  . Multiple Vitamin (MULTIVITAMIN) tablet Take 1 tablet by mouth daily.    Marland Kitchen omeprazole (PRILOSEC) 40 MG capsule TAKE 1 CAPSULE BY MOUTH EVERY DAY (Patient taking differently: Take 40 mg by mouth daily.) 90 capsule 1  . ondansetron (ZOFRAN) 4 MG tablet Take 1 tablet (4 mg total) by mouth every 8 (eight) hours as needed for nausea or vomiting. 30 tablet 1  . OVER THE COUNTER MEDICATION Take 1 tablet by mouth in the morning and at bedtime. Patient reports that she is taking a ViViscal Tablet by mouth twice daily for hair loss.    . polyethylene glycol (MIRALAX / GLYCOLAX) 17 g packet Take 17 g by mouth daily. (Patient taking differently: Take 17 g by mouth daily as needed for moderate constipation.) 30 each 1  . rosuvastatin (CRESTOR) 20 MG tablet Take 1 tablet (20 mg total) by mouth daily. 90 tablet 3   No current facility-administered medications for this visit.    ALLERGIES:  Allergies  Allergen Reactions  . Morphine And Related     Delirium with sbo    PHYSICAL EXAM:  Performance status (ECOG): 1 - Symptomatic but completely ambulatory  Vitals:   12/21/20 1209  BP: 118/62  Pulse: 81  Resp: 16  Temp: (!) 97 F (36.1 C)  SpO2: 99%   Wt Readings from Last 3 Encounters:  12/21/20 111 lb 9.6 oz (50.6 kg)  10/06/20 111 lb 0.6 oz (50.4 kg)  07/22/20 121 lb (54.9 kg)   Physical Exam Vitals reviewed.  Constitutional:      Appearance: Normal appearance.  Cardiovascular:     Rate and Rhythm: Normal rate and regular  rhythm.     Pulses: Normal pulses.     Heart sounds: Normal heart sounds.  Pulmonary:     Effort: Pulmonary effort  is normal.     Breath sounds: Normal breath sounds.  Abdominal:     Palpations: Abdomen is soft. There is no hepatomegaly or mass.     Tenderness: There is no abdominal tenderness.     Hernia: No hernia is present.  Lymphadenopathy:     Lower Body: No right inguinal adenopathy. No left inguinal adenopathy.  Neurological:     General: No focal deficit present.     Mental Status: She is alert and oriented to person, place, and time.  Psychiatric:        Mood and Affect: Mood normal.        Behavior: Behavior normal.      LABORATORY DATA:  I have reviewed the labs as listed.  CBC Latest Ref Rng & Units 12/14/2020 07/22/2020 06/15/2020  WBC 4.0 - 10.5 K/uL 5.3 4.7 5.0  Hemoglobin 12.0 - 15.0 g/dL 11.8(L) 12.1 13.0  Hematocrit 36.0 - 46.0 % 35.2(L) 36.3 40.4  Platelets 150 - 400 K/uL 188 255 309   CMP Latest Ref Rng & Units 12/14/2020 07/22/2020 06/15/2020  Glucose 70 - 99 mg/dL 99 80 102(H)  BUN 8 - 23 mg/dL $Remove'19 18 21  'gWHwLSl$ Creatinine 0.44 - 1.00 mg/dL 0.56 0.75 0.91  Sodium 135 - 145 mmol/L 137 140 138  Potassium 3.5 - 5.1 mmol/L 4.1 5.0 4.4  Chloride 98 - 111 mmol/L 101 104 100  CO2 22 - 32 mmol/L $RemoveB'26 26 28  'JbxvybDq$ Calcium 8.9 - 10.3 mg/dL 8.8(L) 9.5 9.5  Total Protein 6.5 - 8.1 g/dL 6.8 6.4 6.8  Total Bilirubin 0.3 - 1.2 mg/dL 0.2(L) 0.3 0.5  Alkaline Phos 38 - 126 U/L 68 - 74  AST 15 - 41 U/L $Remo'26 19 26  'uLUwr$ ALT 0 - 44 U/L $Remo'21 9 18    'CHNko$ DIAGNOSTIC IMAGING:  I have independently reviewed the scans and discussed with the patient. No results found.   ASSESSMENT:  1. Stage II (T2 N0 M0) anal cancer: -Diagnosed in December 2016. -Chemoradiation therapy with 5-FU and mitomycin completed on 12/06/2015, treated at [facility]. -She is continuing follow-ups with Dr. Marcello Moores with anoscopy.  2. Stage III ovarian cancer: -Diagnosed in 2015, status post debulking surgery followed by 6 cycles of carboplatin and Taxol completed in March 2016.   PLAN:  1. Stage II (T2 N0 M0) anal cancer: -No  palpable inguinal lymph nodes. -Colonoscopy on 10/12/2020 shows diverticulosis in the sigmoid colon with no other findings on the perianal exam. -No inguinal lymph nodes palpable.  Continue follow-ups with Dr. Marcello Moores.  2. Stage III ovarian cancer: -CT CAP on 05/25/2020 did not show any evidence of mass or recurrence. -We reviewed CA-125 level which was 11.3.  Abdominal examination was benign.  3. Normocytic anemia: -She stopped taking iron tablet secondary to constipation. -Labs today shows hemoglobin 11.8 and MCV 96. -RTC 4 months with repeat CBC, ferritin and iron panel. -If there is any drop in ferritin or hemoglobin, will consider parenteral iron therapy.     Orders placed this encounter:  No orders of the defined types were placed in this encounter.    Derek Jack, MD [place] [phone]   I, Milinda Antis, am acting as a scribe for Dr. Sanda Linger.  I, Derek Jack MD, have reviewed the above documentation for accuracy and completeness, and I agree with the  above.     

## 2020-12-21 NOTE — Patient Instructions (Signed)
[facility] °Discharge Instructions ° °You were seen today by Dr. Katragadda. He went over your recent results. Dr. Katragadda will see you back in 4 months for labs and follow up. ° ° °Thank you for choosing [facility] at [facility] to provide your oncology and hematology care.  To afford each patient quality time with our provider, please arrive at least 15 minutes before your scheduled appointment time.  ° °If you have a lab appointment with the Cancer Center please come in thru the Main Entrance and check in at the main information desk ° °You need to re-schedule your appointment should you arrive 10 or more minutes late.  We strive to give you quality time with our providers, and arriving late affects you and other patients whose appointments are after yours.  Also, if you no show three or more times for appointments you may be dismissed from the clinic at the providers discretion.     °Again, thank you for choosing [facility].  Our hope is that these requests will decrease the amount of time that you wait before being seen by our physicians.       °_____________________________________________________________ ° °Should you have questions after your visit to [facility], please contact our office at [phone] between the hours of 8:00 a.m. and 4:30 p.m.  Voicemails left after 4:00 p.m. will not be returned until the following business day.  For prescription refill requests, have your pharmacy contact our office and allow 72 hours.   ° °Cancer Center Support Programs:  ° °> Cancer Support Group  °2nd Tuesday of the month 1pm-2pm, Journey Room  ° ° °

## 2020-12-30 ENCOUNTER — Other Ambulatory Visit: Payer: Self-pay | Admitting: *Deleted

## 2020-12-30 MED ORDER — OMEPRAZOLE 40 MG PO CPDR: 40.0000 mg | DELAYED_RELEASE_CAPSULE | Freq: Every day | ORAL | 3 refills | Status: DC

## 2021-01-04 ENCOUNTER — Telehealth: Payer: Self-pay | Admitting: Family Medicine

## 2021-01-04 NOTE — Progress Notes (Signed)
  Chronic Care Management   Outreach Note  01/04/2021 Name: Abigail Wagner MRN: [id number] DOB: 02-28-1951  Referred by: Susy Frizzle, MD Reason for referral : No chief complaint on file.   An unsuccessful telephone outreach was attempted today. The patient was referred to the pharmacist for assistance with care management and care coordination.   Follow Up Plan:   Carley Perdue [employer] Scheduler

## 2021-01-06 ENCOUNTER — Telehealth: Payer: Self-pay | Admitting: Family Medicine

## 2021-01-06 ENCOUNTER — Other Ambulatory Visit: Payer: Self-pay | Admitting: *Deleted

## 2021-01-06 NOTE — Telephone Encounter (Signed)
Received call from patient.   Requested refill on Hydrocodone/APAP.   Ok to refill??  Last office visit 07/22/2020.  Last refill 12/06/2020.

## 2021-01-10 ENCOUNTER — Ambulatory Visit (INDEPENDENT_AMBULATORY_CARE_PROVIDER_SITE_OTHER): Payer: Medicare Other | Admitting: Nurse Practitioner

## 2021-01-10 ENCOUNTER — Encounter: Payer: Self-pay | Admitting: Nurse Practitioner

## 2021-01-10 ENCOUNTER — Other Ambulatory Visit: Payer: Self-pay

## 2021-01-10 VITALS — BP 114/80 | Temp 97.5°F | Ht 65.0 in | Wt 119.9 lb

## 2021-01-10 DIAGNOSIS — Z0001 Encounter for general adult medical examination with abnormal findings: Secondary | ICD-10-CM | POA: Diagnosis not present

## 2021-01-10 DIAGNOSIS — K219 Gastro-esophageal reflux disease without esophagitis: Secondary | ICD-10-CM | POA: Diagnosis not present

## 2021-01-10 DIAGNOSIS — Z0000 Encounter for general adult medical examination without abnormal findings: Secondary | ICD-10-CM

## 2021-01-10 DIAGNOSIS — I6522 Occlusion and stenosis of left carotid artery: Secondary | ICD-10-CM | POA: Diagnosis not present

## 2021-01-10 DIAGNOSIS — I10 Essential (primary) hypertension: Secondary | ICD-10-CM

## 2021-01-10 DIAGNOSIS — H9193 Unspecified hearing loss, bilateral: Secondary | ICD-10-CM

## 2021-01-10 LAB — COMPLETE METABOLIC PANEL WITH GFR
AG Ratio: 1.8 (calc) (ref 1.0–2.5)
ALT: 11 U/L (ref 6–29)
AST: 23 U/L (ref 10–35)
Albumin: 4.2 g/dL (ref 3.6–5.1)
Alkaline phosphatase (APISO): 46 U/L (ref 37–153)
BUN: 22 mg/dL (ref 7–25)
CO2: 27 mmol/L (ref 20–32)
Calcium: 9.4 mg/dL (ref 8.6–10.4)
Chloride: 105 mmol/L (ref 98–110)
Creat: 0.89 mg/dL (ref 0.60–0.93)
GFR, Est African American: 76 mL/min/{1.73_m2} (ref >=60)
GFR, Est Non African American: 66 mL/min/{1.73_m2} (ref >=60)
Globulin: 2.4 g/dL (calc) (ref 1.9–3.7)
Glucose, Bld: 76 mg/dL (ref 65–99)
Potassium: 4.7 mmol/L (ref 3.5–5.3)
Sodium: 142 mmol/L (ref 135–146)
Total Bilirubin: 0.2 mg/dL (ref 0.2–1.2)
Total Protein: 6.6 g/dL (ref 6.1–8.1)

## 2021-01-10 LAB — LIPID PANEL
Cholesterol: 152 mg/dL (ref <200)
HDL: 58 mg/dL (ref >=50)
LDL Cholesterol (Calc): 80 mg/dL (calc)
Non-HDL Cholesterol (Calc): 94 mg/dL (calc) (ref <130)
Total CHOL/HDL Ratio: 2.6 (calc) (ref <5.0)
Triglycerides: 56 mg/dL (ref <150)

## 2021-01-10 LAB — MAGNESIUM: Magnesium: 2.1 mg/dL (ref 1.5–2.5)

## 2021-01-10 MED ORDER — HYDROCODONE-ACETAMINOPHEN 5-325 MG PO TABS: 1.0000 | ORAL_TABLET | Freq: Four times a day (QID) | ORAL | 0 refills | Status: DC | PRN

## 2021-01-10 NOTE — Progress Notes (Signed)
Patient: Abigail Wagner, Female    DOB: 12-29-1950, 70 y.o.   MRN: [id number]  Visit Date: 01/10/2021  Today's Provider: Eulogio Bear, NP   Chief Complaint  Patient presents with  . Medicare Wellness    Having back pain for 2 wks, takes hydrocodone for the pain. Taking once a day, asking for refill. Will provide copy of Living Will nx visit  . Anxiety  . Depression    Form completed     Subjective:   Abigail Wagner is a 70 y.o. female who presents today for her Subsequent Annual Wellness Visit.  HPI   Depression and anxiety - sees Dr. Harrington Challenger every 6 months; currently taking Lexapro 20 mg daily and Ativan 1 mg three times daily as needed.   HTN/HLD - Currently taking losartan 50 mg for high blood pressure and Crestor 20 mg for high cholesterol  Current smoker - currently smoking 1/2 ppd.  Not interested in quitting at this time.  Reports she tried in the past, but her husband did not want to quit and so she started smoking again.   GERD - Currently taking omeprazole 40 mg daily and tolerating this well.  Denies acute issues with bowel movements/abdomen.  Follows with GI.  History of Anal CA and ovarian cancer- Sees oncology Dr. Delton Coombes and Dr. Marcello Moores for management.  Follows with each at least yearly.  Chronic back pain - takes hydrocodone 5-325 mg at least daily; was on 2 per day in the past and is requesitng prescription be increased to this dosing.  Hearing loss - Reports this has been gradually getting worse.  Is requesting referral to Audiologist in [place].  Review of Systems  Constitutional: Negative.   HENT: Positive for hearing loss. Negative for congestion, ear discharge, ear pain, postnasal drip, rhinorrhea, sinus pressure, sinus pain, sneezing and sore throat.   Eyes: Negative.   Respiratory: Negative.   Gastrointestinal: Negative.   Genitourinary: Negative.   Musculoskeletal: Positive for back pain. Negative for arthralgias, joint swelling, myalgias and neck  stiffness.  Skin: Negative.   Neurological: Negative.   Psychiatric/Behavioral: Negative.     Past Medical History:  Diagnosis Date  . Allergy   . Anal carcinoma [facility]) oncologist-  dr Whitney Muse ([facility])   dx 12/ 2016 [facility] --  chemo and radiation- Dr. Marcello Moores  . Anemia    due to her cancer  . Anxiety    Dr. Harrington Challenger at [facility] (psychiatrist).    . Carcinoma of ovary, stage 3 [facility]) oncologist-  dr gehrig/  dr Doreene Eland (cancer center in [place])--  no recurrency   dx 09/ 2015  Stage IIIB  papillary ovarian carcinoma  s/p  omentectomy and BSO &  chemotherapy (completed 12-07-2014)  . Chemotherapy induced nausea and vomiting 10/23/2014  . Chronic kidney disease    stones  . DDD (degenerative disc disease), lumbosacral   . GERD (gastroesophageal reflux disease)   . Headache(784.0)   . History of acute respiratory failure    05-30-2009  drug overdose-- (intubated for 2 days)  and aspiration pneumonia  . History of adenomatous polyp of colon 10/07/2015   tubular adenoma high grade dysplasia  . History of herpes genitalis   . History of kidney stones   . History of ovarian cancer 08/03/2015  . History of suicide attempt    per documentation in epic  05-30-2009  overdose benaodiazepine  . HTN (hypertension)    takes Metoprolol daily  . Hyperlipidemia   .  Hypertension 09/27/2016  . IBS (irritable bowel syndrome)    takes Bentyl daily  . Internal carotid artery stenosis, left    <50%, ulcerative plaque at carotid bulb.   . Major depression   . Mood disorder ([place])   . OA (osteoarthritis)    both hips  . Ovarian cancer ([place])    2015-TAH/BSO  . Prolapse of vaginal vault after hysterectomy 07/20/2015  . Rectovaginal fistula 08/05/2015  . Sigmoid diverticulosis   . Smokers' cough [facility])     Past Surgical History:  Procedure Laterality Date  . BIOPSY  10/12/2020   Procedure: BIOPSY;  Surgeon: Harvel Quale, MD;  [facility];  Service:  Gastroenterology;;  . [place]  . [place]  . COLONOSCOPY N/A 10/07/2015   Procedure: COLONOSCOPY;  Surgeon: Rogene Houston, MD;  [facility];  Service: Endoscopy;  Laterality: N/A;  7:30  . COLONOSCOPY WITH PROPOFOL N/A 10/12/2020   Procedure: COLONOSCOPY WITH PROPOFOL;  Surgeon: Harvel Quale, MD;  [facility];  Service: Gastroenterology;  Laterality: N/A;  9:00  . ESOPHAGOGASTRODUODENOSCOPY (EGD) WITH PROPOFOL N/A 10/12/2020   Procedure: ESOPHAGOGASTRODUODENOSCOPY (EGD) WITH PROPOFOL;  Surgeon: Harvel Quale, MD;  [facility];  Service: Gastroenterology;  Laterality: N/A;  . EXPLORATORY LAPAROTOMY/ OMENTECTOMY/  BILATERAL SALPINGOOPHORECTOMY/  PORT-A-CATH PLACEMENT  07-03-2014   [facility]  . KNEE ARTHROSCOPY Left 09-22-2004  . LAPAROSCOPY N/A 06/02/2014   Procedure: DIAGNOSTIC LAPAROSCOPY, OMENTAL BIOPSY, RIGHT OVARY BIOPSY, LYSIS OF ADHESIONS;  Surgeon: Fanny Skates, MD;  Location: [place];  Service: General;  Laterality: N/A;  . MUCOSAL ADVANCEMENT FLAP N/A 06/15/2016   Procedure: EXCISION RECTOVAGINAOL FISTULA WITH MUCOSAL ADVANCEMENT FLAP;  Surgeon: Leighton Ruff, MD;  Location: [place];  Service: General;  Laterality: N/A;  . PLACEMENT OF SETON N/A 09/09/2015   Procedure: PLACEMENT OF SETON;  Surgeon: Leighton Ruff, MD;  Location: [place];  Service: General;  Laterality: N/A;  . PORT-A-CATH REMOVAL Right 08/17/2014   Procedure: REMOVAL INTRAPERITONEAL CHEMO PORT;  Surgeon: Fanny Skates, MD;  Location: [place];  Service: General;  Laterality: Right;  . PORTACATH PLACEMENT Right 08/17/2014   Procedure:  PLACE NEW PORT A CATH;  Surgeon: Fanny Skates, MD;  Location: [place];  Service: General;  Laterality: Right;  . RECTAL BIOPSY N/A 09/09/2015   Procedure: BIOPSY OF RECTOVAGINAL MASS;  Surgeon: Leighton Ruff, MD;   Location: [place];  Service: General;  Laterality: N/A;  . TUBAL LIGATION  YRS AGO  . VAGINAL HYSTERECTOMY  1981   fibroids    Family History  Problem Relation Age of Onset  . Bipolar disorder Mother   . Anxiety disorder Mother   . Dementia Mother   . Depression Mother   . Mental illness Mother   . Vision loss Mother   . Alzheimer's disease Mother   . Alcohol abuse Paternal Uncle   . Bipolar disorder Maternal Grandmother   . Dementia Maternal Grandmother   . Alzheimer's disease Maternal Grandmother   . Alcohol abuse Paternal Uncle   . Stroke Brother   . Deep vein thrombosis Son   . Heart disease Father   . Hyperlipidemia Father   . Hypertension Father   . Stroke Father   . Vision loss Father   . Atrial fibrillation Sister   . Heart disease Brother   . Heart disease Brother   . ADD / ADHD  Neg Hx   . Drug abuse Neg Hx   . OCD Neg Hx   . Paranoid behavior Neg Hx   . Schizophrenia Neg Hx   . Seizures Neg Hx   . Sexual abuse Neg Hx   . Physical abuse Neg Hx     Social History   Socioeconomic History  . Marital status: Married    Spouse name: Abigail Wagner  . Number of children: 3  . Years of education: 11  . Highest education level: Not on file  Occupational History  . Occupation: retire    Comment: [employer]  Tobacco Use  . Smoking status: Current Every Day Smoker    Packs/day: 0.50    Years: 43.00    Pack years: 21.50    Types: Cigarettes  . Smokeless tobacco: Never Used  Vaping Use  . Vaping Use: Never used  Substance and Sexual Activity  . Alcohol use: No  . Drug use: No  . Sexual activity: Not Currently    Birth control/protection: Surgical    Comment: hyst  Other Topics Concern  . Not on file  Social History Narrative   Lives at home with Abigail Wagner   Retired [place] Strain: Not on file  Food Insecurity: Not on file  Transportation Needs: Not on file  Physical Activity: Not on  file  Stress: Not on file  Social Connections: Not on file  Intimate Partner Violence: Not on file    Outpatient Encounter Medications as of 01/10/2021  Medication Sig Note  . b complex vitamins tablet Take 1 tablet by mouth daily.   . Cholecalciferol (VITAMIN D-3) 125 MCG (5000 UT) TABS Take 5,000 Units by mouth daily.   . Coenzyme Q10 (CO Q 10) 100 MG CAPS Take 100 mg by mouth daily.   . divalproex (DEPAKOTE ER) 500 MG 24 hr tablet Take 1 tablet (500 mg total) by mouth daily.   Marland Kitchen docusate sodium (COLACE) 100 MG capsule Take 1 capsule (100 mg total) by mouth daily as needed for moderate constipation.   Marland Kitchen escitalopram (LEXAPRO) 20 MG tablet Take 1 tablet (20 mg total) by mouth 2 (two) times daily.   . Ferrous Sulfate 142 (45 Fe) MG TBCR Take 142 mg by mouth daily.   Marland Kitchen HYDROcodone-acetaminophen (NORCO) 5-325 MG tablet Take 1 tablet by mouth every 6 (six) hours as needed for moderate pain.   Marland Kitchen KRILL OIL PO Take 350 mg by mouth daily.   Marland Kitchen LORazepam (ATIVAN) 1 MG tablet Take 1 tablet (1 mg total) by mouth 3 (three) times daily as needed for anxiety.   Marland Kitchen losartan (COZAAR) 50 MG tablet TAKE 1 TABLET BY MOUTH EVERY DAY (Patient taking differently: Take 50 mg by mouth daily.)   . Multiple Vitamin (MULTIVITAMIN) tablet Take 1 tablet by mouth daily.   Marland Kitchen omeprazole (PRILOSEC) 40 MG capsule Take 1 capsule (40 mg total) by mouth daily.   . ondansetron (ZOFRAN) 4 MG tablet Take 1 tablet (4 mg total) by mouth every 8 (eight) hours as needed for nausea or vomiting.   Marland Kitchen OVER THE COUNTER MEDICATION Take 1 tablet by mouth in the morning and at bedtime. Patient reports that she is taking a ViViscal Tablet by mouth twice daily for hair loss.   . polyethylene glycol (MIRALAX / GLYCOLAX) 17 g packet Take 17 g by mouth daily. (Patient taking differently: Take 17 g by mouth daily as needed for moderate constipation.) 10/06/2020: Patient  and Husband report that she takes as needed.  . rosuvastatin (CRESTOR) 20 MG tablet  Take 1 tablet (20 mg total) by mouth daily.    No facility-administered encounter medications on file as of 01/10/2021.   Functional Ability / Safety Screening 1.  Was the timed Get Up and Go test longer than 30 seconds?  no 2.  Does the patient need help with the phone, transportation, shopping,      preparing meals, housework, laundry, medications, or managing money?  no 3.  Does the patient's home have:  loose throw rugs in the hallway?   no      Grab bars in the bathroom? yes      Handrails on the stairs?   yes      Poor lighting?   no 4.  Has the patient noticed any hearing difficulties?   no  Fall Risk Assessment See under rooming  Depression Screen See under rooming Depression screen [facility] 2/9 01/10/2021 01/10/2021 06/08/2020 02/16/2020 05/14/2018  Decreased Interest 2 0 0 0 1  Down, Depressed, Hopeless 2 - 1 0 0  PHQ - 2 Score 4 0 1 0 1  Altered sleeping 2 - - - 0  Tired, decreased energy 2 - - - 1  Change in appetite 1 - - - 0  Feeling bad or failure about yourself  1 - - - 0  Trouble concentrating 0 - - - 1  Moving slowly or fidgety/restless 1 - - - 1  Suicidal thoughts 0 - - - 0  PHQ-9 Score 11 - - - 4  Difficult doing work/chores - - - - Somewhat difficult  Some recent data might be hidden   GAD 7 : Generalized Anxiety Score 01/10/2021  Nervous, Anxious, on Edge 2  Control/stop worrying 1  Worry too much - different things 2  Trouble relaxing 1  Restless 1  Easily annoyed or irritable 1  Afraid - awful might happen 0  Total GAD 7 Score 8  Anxiety Difficulty Very difficult   Advanced Directives Does patient have a HCPOA?    yes; is going to bring copy of paperwork for [place] to have on file If yes, name and contact information:  Husband Abigail Wagner Does patient have a living will or MOST form?  yes  Objective:   Vitals: BP 114/80   Temp (!) 97.5 F (36.4 C)   Ht 5\' 5"  (1.651 m)   Wt 119 lb 14.4 oz (54.4 kg)   BMI 19.95 kg/m  Body mass index is 19.95 kg/m. No exam  data present  Physical Exam Vitals and nursing note reviewed.  Constitutional:      General: She is not in acute distress.    Appearance: Normal appearance. She is not toxic-appearing.  HENT:     Head: Normocephalic and atraumatic.     Right Ear: Tympanic membrane, ear canal and external ear normal. There is no impacted cerumen.     Left Ear: Tympanic membrane, ear canal and external ear normal. There is no impacted cerumen.     Nose: Nose normal. No congestion.     Mouth/Throat:     Mouth: Mucous membranes are moist.     Pharynx: Oropharynx is clear. No oropharyngeal exudate.  Eyes:     General: No scleral icterus.    Extraocular Movements: Extraocular movements intact.  Cardiovascular:     Heart sounds: Normal heart sounds. No murmur heard.   Pulmonary:     Effort: Pulmonary effort is normal. No respiratory  distress.     Breath sounds: Normal breath sounds. No wheezing, rhonchi or rales.  Musculoskeletal:     Cervical back: Normal range of motion.  Skin:    General: Skin is warm and dry.     Capillary Refill: Capillary refill takes less than 2 seconds.     Coloration: Skin is not jaundiced or pale.     Findings: No erythema.  Neurological:     Mental Status: She is alert and oriented to person, place, and time.  Psychiatric:        Mood and Affect: Mood normal.        Behavior: Behavior normal.        Thought Content: Thought content normal.        Judgment: Judgment normal.    6CIT Screen 01/10/2021  What Year? 0 points  What month? 0 points  What time? 0 points  Count back from 20 0 points  Months in reverse 0 points  Repeat phrase 0 points  Total Score 0    Assessment & Plan:     Depression/Anxiety   Chronic.  PHQ-9 and GAD-7 elevated today.  Encouraged patient to follow up with Psychiatrist when she is able to; may need adjustment in medication or counseling - Dr. Harrington Challenger.  No SI/HI today.  Continue collaboration.  HTN/HLD   Chronic.  BP at goal today and  in clinic and reportedly at home as well.  Will check CMP to check kidney function and electrolytes.  Will also check lipid panel - patient is fasting.  Continue Crestor at 20 mg daily for now.    Current smoker  Not interested in quitting at this time.  Encouraged completed cessation.   GERD   Chronic.  Stable with omeprazole 40 mg daily.  Will continue for now.  Magnesium level today.  History of anal and ovarian cancer  Follows with Oncology and General surgery.  Continue collaboration.   Chronic back pain   Chronic.  With request to adjust chronic daily pain medication, will defer to PCP.   Bilateral Hearing loss  Referral to Audiology for evaluation and management.   Annual Wellness Visit  Reviewed patient's Family Medical History  Reviewed and updated list of patient's medical providers  Assessment of cognitive impairment was done  Assessed patient's functional ability  Established a written schedule for health screening [place] Completed and Reviewed  Exercise Activities and Dietary recommendations Goals    . Exercise 3x per week (30 min per time)          . Quit Smoking       Immunization History  Administered Date(s) Administered  . Fluad Quad(high Dose 65+) 08/16/2020  . Influenza, High Dose Seasonal PF 07/17/2017, 07/09/2018, 06/16/2019  . Influenza-Unspecified 10/04/2016  . Moderna Sars-Covid-2 Vaccination 12/25/2019, 01/24/2020, 08/16/2020  . Pneumococcal Conjugate-13 11/07/2016  . Pneumococcal Polysaccharide-23 05/14/2018  . Tdap 09/25/2008    Health Maintenance  Topic Date Due  . TETANUS/TDAP  01/10/2022 (Originally 09/25/2018)  . COVID-19 Vaccine (4 - Booster for Moderna series) 02/13/2021  . MAMMOGRAM  07/27/2021  . COLONOSCOPY (Pts 45-58yrs Insurance coverage will need to be confirmed)  10/12/2030  . DEXA SCAN  Completed  . Hepatitis C Screening  Completed  . PNA vac Low Risk Adult  Completed  . HPV VACCINES  Aged  Out    Discussed health benefits of physical activity, and encouraged her to engage in regular exercise appropriate for her age and condition.  No orders of the defined types were placed in this encounter.   Current Outpatient Medications:  .  b complex vitamins tablet, Take 1 tablet by mouth daily., Disp: , Rfl:  .  Cholecalciferol (VITAMIN D-3) 125 MCG (5000 UT) TABS, Take 5,000 Units by mouth daily., Disp: , Rfl:  .  Coenzyme Q10 (CO Q 10) 100 MG CAPS, Take 100 mg by mouth daily., Disp: , Rfl:  .  divalproex (DEPAKOTE ER) 500 MG 24 hr tablet, Take 1 tablet (500 mg total) by mouth daily., Disp: 90 tablet, Rfl: 3 .  docusate sodium (COLACE) 100 MG capsule, Take 1 capsule (100 mg total) by mouth daily as needed for moderate constipation., Disp: 30 capsule, Rfl: 2 .  escitalopram (LEXAPRO) 20 MG tablet, Take 1 tablet (20 mg total) by mouth 2 (two) times daily., Disp: 180 tablet, Rfl: 2 .  Ferrous Sulfate 142 (45 Fe) MG TBCR, Take 142 mg by mouth daily., Disp: , Rfl:  .  HYDROcodone-acetaminophen (NORCO) 5-325 MG tablet, Take 1 tablet by mouth every 6 (six) hours as needed for moderate pain., Disp: 30 tablet, Rfl: 0 .  KRILL OIL PO, Take 350 mg by mouth daily., Disp: , Rfl:  .  LORazepam (ATIVAN) 1 MG tablet, Take 1 tablet (1 mg total) by mouth 3 (three) times daily as needed for anxiety., Disp: 90 tablet, Rfl: 5 .  losartan (COZAAR) 50 MG tablet, TAKE 1 TABLET BY MOUTH EVERY DAY (Patient taking differently: Take 50 mg by mouth daily.), Disp: 90 tablet, Rfl: 3 .  Multiple Vitamin (MULTIVITAMIN) tablet, Take 1 tablet by mouth daily., Disp: , Rfl:  .  omeprazole (PRILOSEC) 40 MG capsule, Take 1 capsule (40 mg total) by mouth daily., Disp: 90 capsule, Rfl: 3 .  ondansetron (ZOFRAN) 4 MG tablet, Take 1 tablet (4 mg total) by mouth every 8 (eight) hours as needed for nausea or vomiting., Disp: 30 tablet, Rfl: 1 .  OVER THE COUNTER MEDICATION, Take 1 tablet by mouth in the morning and at bedtime.  Patient reports that she is taking a ViViscal Tablet by mouth twice daily for hair loss., Disp: , Rfl:  .  polyethylene glycol (MIRALAX / GLYCOLAX) 17 g packet, Take 17 g by mouth daily. (Patient taking differently: Take 17 g by mouth daily as needed for moderate constipation.), Disp: 30 each, Rfl: 1 .  rosuvastatin (CRESTOR) 20 MG tablet, Take 1 tablet (20 mg total) by mouth daily., Disp: 90 tablet, Rfl: 3 There are no discontinued medications.  Next Medicare Wellness Visit in 12+ months

## 2021-02-02 ENCOUNTER — Telehealth: Payer: Self-pay | Admitting: Family Medicine

## 2021-02-02 ENCOUNTER — Other Ambulatory Visit: Payer: Self-pay | Admitting: *Deleted

## 2021-02-02 NOTE — Progress Notes (Signed)
  Chronic Care Management   Note  02/02/2021 Name: Abigail Wagner MRN: [id number] DOB: 26-Sep-1950  Abigail Wagner is a 70 y.o. year old female who is a primary care patient of Pickard, Cammie Mcgee, MD. I reached out to [name] by phone today in response to a referral sent by Abigail Wagner's PCP, Susy Frizzle, MD.   Abigail Wagner was given information about Chronic Care Management services today including:  1. CCM service includes personalized support from designated clinical staff supervised by her physician, including individualized plan of care and coordination with other care providers 2. 24/7 contact phone numbers for assistance for urgent and routine care needs. 3. Service will only be billed when office clinical staff spend 20 minutes or more in a month to coordinate care. 4. Only one practitioner may furnish and bill the service in a calendar month. 5. The patient may stop CCM services at any time (effective at the end of the month) by phone call to the office staff.   Patient agreed to services and verbal consent obtained.   Follow up plan:   Carley Perdue [employer] Scheduler

## 2021-02-02 NOTE — Telephone Encounter (Signed)
Received call from patient.   Requested refill on Hydrocodone/APAP.   Ok to refill??  Last office visit/ refill 01/10/2021.

## 2021-02-03 MED ORDER — HYDROCODONE-ACETAMINOPHEN 5-325 MG PO TABS
1.0000 | ORAL_TABLET | Freq: Four times a day (QID) | ORAL | 0 refills | Status: DC | PRN
Start: 2021-02-03 — End: 2021-03-03

## 2021-02-07 ENCOUNTER — Telehealth (INDEPENDENT_AMBULATORY_CARE_PROVIDER_SITE_OTHER): Payer: Medicare Other | Admitting: Psychiatry

## 2021-02-07 ENCOUNTER — Ambulatory Visit: Payer: Medicare Other | Attending: Nurse Practitioner | Admitting: Audiology

## 2021-02-07 ENCOUNTER — Encounter (HOSPITAL_COMMUNITY): Payer: Self-pay | Admitting: Psychiatry

## 2021-02-07 ENCOUNTER — Other Ambulatory Visit: Payer: Self-pay

## 2021-02-07 DIAGNOSIS — H903 Sensorineural hearing loss, bilateral: Secondary | ICD-10-CM | POA: Diagnosis not present

## 2021-02-07 DIAGNOSIS — F331 Major depressive disorder, recurrent, moderate: Secondary | ICD-10-CM | POA: Diagnosis not present

## 2021-02-07 MED ORDER — LORAZEPAM 1 MG PO TABS: 1.0000 mg | ORAL_TABLET | Freq: Three times a day (TID) | ORAL | 5 refills | Status: DC | PRN

## 2021-02-07 MED ORDER — ESCITALOPRAM OXALATE 20 MG PO TABS
20.0000 mg | ORAL_TABLET | Freq: Two times a day (BID) | ORAL | 2 refills | Status: DC
Start: 2021-02-07 — End: 2021-04-29

## 2021-02-07 MED ORDER — DIVALPROEX SODIUM ER 500 MG PO TB24: 500.0000 mg | ORAL_TABLET | Freq: Every day | ORAL | 3 refills | Status: DC

## 2021-02-07 MED ORDER — MIRTAZAPINE 15 MG PO TABS: 15.0000 mg | ORAL_TABLET | Freq: Every day | ORAL | 2 refills | Status: DC

## 2021-02-07 NOTE — Procedures (Signed)
  Outpatient Audiology and [address] [place], [place]  [address] [phone]  AUDIOLOGICAL  EVALUATION  NAME: Abigail Wagner     DOB:   1951/03/21      MRN: [id number]                                                                                     DATE: 02/07/2021     REFERENT: Susy Frizzle, MD STATUS: Outpatient DIAGNOSIS: Sensorineural Hearing Loss, bilateral    History: Lupe was seen for an audiological evaluation due to decreased hearing and aural fullness occurring in the left ear for 2-3 months. She reports increased difficulty communicating with her husband. Lindey denies otalgia, tinnitus, and dizziness.   Evaluation:   Otoscopy showed a clear view of the tympanic membranes, bilaterally  Tympanometry results were consistent with normal middle ear pressure and normal tympanic membrane mobility, bilaterally.   Audiometric testing was completed using Conventional Audiometry techniques with insert earphones and TDH headphones. Test results are consistent with a moderately-severe to profound sensorineural hearing loss in the right ear and a severe to profound sensorineural hearing loss in the left ear. Sensorineural asymmetry noted at [id number] Hz, worse in the left ear.  Speech Recognition Thresholds were obtained at 65 dB HL, bilaterally.  Word Recognition Testing was completed at 90 dB HL and Valisha scored 76% in the right ear and 8-% in the left ear.    Results:  The test results were reviewed with [facility]. Test results are consistent with a moderately-severe to profound sensorineural hearing loss in the right ear and a severe to profound sensorineural hearing loss in the left ear. Sensorineural asymmetry noted at [id number] Hz, worse in the left ear. Henri will have communication difficulty in all listening environments. She will benefit from the use of good communication strategies and amplification. Zoelle was given a handout on Lexicographer in  the [place].   Recommendations: 1. Referral to an Ear, Nose, and Throat Physician due to sensorineural asymmetry.  2. Communication Needs Assessment with an Audiologist to discuss amplification and hearing aids.     Bari Mantis Audiologist, Au.D., CCC-A 02/07/2021  2:40 PM  Cc: Susy Frizzle, MD

## 2021-02-07 NOTE — Progress Notes (Signed)
Virtual Visit via Telephone Note  I connected with Abigail Wagner on 02/07/21 at 11:00 AM EDT by telephone and verified that I am speaking with the correct person using two identifiers.  Location: Patient: home Provider: home office   I discussed the limitations, risks, security and privacy concerns of performing an evaluation and management service by telephone and the availability of in person appointments. I also discussed with the patient that there may be a patient responsible charge related to this service. The patient expressed understanding and agreed to proceed.     I discussed the assessment and treatment plan with the patient. The patient was provided an opportunity to ask questions and all were answered. The patient agreed with the plan and demonstrated an understanding of the instructions.   The patient was advised to call back or seek an in-person evaluation if the symptoms worsen or if the condition fails to improve as anticipated.  I provided 15 minutes of non-face-to-face time during this encounter.   Abigail Spiller, MD  [facility] MD/PA/NP OP Progress Note  02/07/2021 11:22 AM Abigail Wagner Abigail Wagner  MRN:  [id number]  Chief Complaint:  Chief Complaint    Anxiety; Depression; Follow-up     HPI: This patient is a 70 year old married white female lives with her husband in [place]. She has 3 children and 5 grandchildren. She is retired from [employer].  The patient states she's had depression since her 41s. She was hospitalized several times, last time being in 2010 when she took a drug overdose. She was going through a lot of family stress back then. She states that she was hospitalized [facility] and since then she has never wanted to go back. She's been quite stable despite her low doses of medication. She's also stopped drinking alcohol which is made a big difference  The patient returns for follow-up after 6 months.  She still has had a hard time recovering from  the bowel obstruction several months ago.  She is still very low in weight and is only about 119 pounds.  She has a very limited repertoire of foods that she can tolerate.  She has been diagnosed with diverticulitis.  She is now seeing a GI specialist and has been placed on omeprazole which is helped to some degree.  She is sleeping too much and has a lot of fatigue.  All of her labs recently were normal except for low hemoglobin hematocrit and RBCs.  I strongly urged her to call back to her heme-onc specialist as he had mentioned an iron infusion if her fatigue does not improve.  She thinks she is somewhat depressed but mostly his frustration over her medical issues.  I think an addition of mirtazapine at night may help with her anxiety and also with appetite Visit Diagnosis:    ICD-10-CM   1. Major depressive disorder, recurrent episode, moderate (HCC)  F33.1 escitalopram (LEXAPRO) 20 MG tablet    Past Psychiatric History: Past hospitalizations for depression and alcohol abuse, none since 2010  Past Medical History:  Past Medical History:  Diagnosis Date  . Allergy   . Anal carcinoma [facility]) oncologist-  dr Whitney Muse ([facility])   dx 12/ 2016 [facility] --  chemo and radiation- Dr. Marcello Moores  . Anemia    due to her cancer  . Anxiety    Dr. Harrington Challenger at [place] (psychiatrist).    . Carcinoma of ovary, stage 3 [facility]) oncologist-  dr gehrig/  dr devoresay (cancer center in  [place] [facility])--  no recurrency   dx 09/ 2015  Stage IIIB  papillary ovarian carcinoma  s/p  omentectomy and BSO &  chemotherapy (completed 12-07-2014)  . Chemotherapy induced nausea and vomiting 10/23/2014  . Chronic kidney disease    stones  . DDD (degenerative disc disease), lumbosacral   . GERD (gastroesophageal reflux disease)   . Headache(784.0)   . History of acute respiratory failure    05-30-2009  drug overdose-- (intubated for 2 days)  and aspiration pneumonia  . History of adenomatous polyp of colon 10/07/2015    tubular adenoma high grade dysplasia  . History of herpes genitalis   . History of kidney stones   . History of ovarian cancer 08/03/2015  . History of suicide attempt    per documentation in epic  05-30-2009  overdose benaodiazepine  . HTN (hypertension)    takes Metoprolol daily  . Hyperlipidemia   . Hypertension 09/27/2016  . IBS (irritable bowel syndrome)    takes Bentyl daily  . Internal carotid artery stenosis, left    <50%, ulcerative plaque at carotid bulb.   . Major depression   . Mood disorder ([place])   . OA (osteoarthritis)    both hips  . Ovarian cancer ([place])    2015-TAH/BSO  . Prolapse of vaginal vault after hysterectomy 07/20/2015  . Rectovaginal fistula 08/05/2015  . Sigmoid diverticulosis   . Smokers' cough [facility])     Past Surgical History:  Procedure Laterality Date  . BIOPSY  10/12/2020   Procedure: BIOPSY;  Surgeon: Harvel Quale, MD;  [facility];  Service: Gastroenterology;;  . [place]  . [place]  . COLONOSCOPY N/A 10/07/2015   Procedure: COLONOSCOPY;  Surgeon: Rogene Houston, MD;  [facility];  Service: Endoscopy;  Laterality: N/A;  7:30  . COLONOSCOPY WITH PROPOFOL N/A 10/12/2020   Procedure: COLONOSCOPY WITH PROPOFOL;  Surgeon: Harvel Quale, MD;  [facility];  Service: Gastroenterology;  Laterality: N/A;  9:00  . ESOPHAGOGASTRODUODENOSCOPY (EGD) WITH PROPOFOL N/A 10/12/2020   Procedure: ESOPHAGOGASTRODUODENOSCOPY (EGD) WITH PROPOFOL;  Surgeon: Harvel Quale, MD;  [facility];  Service: Gastroenterology;  Laterality: N/A;  . EXPLORATORY LAPAROTOMY/ OMENTECTOMY/  BILATERAL SALPINGOOPHORECTOMY/  PORT-A-CATH PLACEMENT  07-03-2014   [facility]  . KNEE ARTHROSCOPY Left 09-22-2004  . LAPAROSCOPY N/A 06/02/2014   Procedure: DIAGNOSTIC LAPAROSCOPY, OMENTAL BIOPSY, RIGHT OVARY BIOPSY, LYSIS OF ADHESIONS;  Surgeon: Fanny Skates, MD;  Location: [place];  Service: General;  Laterality: N/A;  . MUCOSAL ADVANCEMENT FLAP N/A 06/15/2016   Procedure: EXCISION RECTOVAGINAOL FISTULA WITH MUCOSAL ADVANCEMENT FLAP;  Surgeon: Leighton Ruff, MD;  Location: [place];  Service: General;  Laterality: N/A;  . PLACEMENT OF SETON N/A 09/09/2015   Procedure: PLACEMENT OF SETON;  Surgeon: Leighton Ruff, MD;  Location: [place];  Service: General;  Laterality: N/A;  . PORT-A-CATH REMOVAL Right 08/17/2014   Procedure: REMOVAL INTRAPERITONEAL CHEMO PORT;  Surgeon: Fanny Skates, MD;  Location: [place];  Service: General;  Laterality: Right;  . PORTACATH PLACEMENT Right 08/17/2014   Procedure:  PLACE NEW PORT A CATH;  Surgeon: Fanny Skates, MD;  Location: [place];  Service: General;  Laterality: Right;  . RECTAL BIOPSY N/A 09/09/2015   Procedure: BIOPSY OF RECTOVAGINAL MASS;  Surgeon: Leighton Ruff, MD;  Location: [place];  Service: General;  Laterality: N/A;  . TUBAL LIGATION  YRS AGO  . VAGINAL HYSTERECTOMY  1981   fibroids    Family Psychiatric History: see below  Family History:  Family History  Problem Relation Age of Onset  . Bipolar disorder Mother   . Anxiety disorder Mother   . Dementia Mother   . Depression Mother   . Mental illness Mother   . Vision loss Mother   . Alzheimer's disease Mother   . Alcohol abuse Paternal Uncle   . Bipolar disorder Maternal Grandmother   . Dementia Maternal Grandmother   . Alzheimer's disease Maternal Grandmother   . Alcohol abuse Paternal Uncle   . Stroke Brother   . Deep vein thrombosis Son   . Heart disease Father   . Hyperlipidemia Father   . Hypertension Father   . Stroke Father   . Vision loss Father   . Atrial fibrillation Sister   . Heart disease Brother   . Heart disease Brother   . ADD / ADHD Neg Hx   . Drug abuse Neg Hx   . OCD Neg Hx   . Paranoid behavior Neg Hx   . Schizophrenia Neg Hx   .  Seizures Neg Hx   . Sexual abuse Neg Hx   . Physical abuse Neg Hx     Social History:  Social History   Socioeconomic History  . Marital status: Married    Spouse name: Coralyn Mark  . Number of children: 3  . Years of education: 67  . Highest education level: Not on file  Occupational History  . Occupation: retire    Comment: [employer]  Tobacco Use  . Smoking status: Current Every Day Smoker    Packs/day: 0.50    Years: 43.00    Pack years: 21.50    Types: Cigarettes  . Smokeless tobacco: Never Used  Vaping Use  . Vaping Use: Never used  Substance and Sexual Activity  . Alcohol use: No  . Drug use: No  . Sexual activity: Not Currently    Birth control/protection: Surgical    Comment: hyst  Other Topics Concern  . Not on file  Social History Narrative   Lives at home with Coralyn Mark   Retired [employer] Strain: Not on file  Food Insecurity: Not on file  Transportation Needs: Not on file  Physical Activity: Not on file  Stress: Not on file  Social Connections: Not on file    Allergies:  Allergies  Allergen Reactions  . Morphine And Related     Delirium with sbo    Metabolic Disorder Labs: No results found for: HGBA1C, MPG No results found for: PROLACTIN Lab Results  Component Value Date   CHOL 152 01/10/2021   TRIG 56 01/10/2021   HDL 58 01/10/2021   CHOLHDL 2.6 01/10/2021   VLDL 28 01/10/2017   LDLCALC 80 01/10/2021   LDLCALC 69 04/08/2020     Therapeutic Level Labs: No results found for: LITHIUM No results found for: VALPROATE No components found for:  CBMZ  Current Medications: Current Outpatient Medications  Medication Sig Dispense Refill  . mirtazapine (REMERON) 15 MG tablet Take 1 tablet (15 mg total) by mouth at bedtime. 30 tablet 2  . b complex vitamins tablet Take 1 tablet by mouth daily.    . Cholecalciferol (VITAMIN D-3) 125 MCG (5000 UT) TABS Take 5,000 Units by mouth daily.    .  Coenzyme Q10 (CO Q 10) 100 MG CAPS Take 100 mg by  mouth daily.    . divalproex (DEPAKOTE ER) 500 MG 24 hr tablet Take 1 tablet (500 mg total) by mouth daily. 90 tablet 3  . docusate sodium (COLACE) 100 MG capsule Take 1 capsule (100 mg total) by mouth daily as needed for moderate constipation. 30 capsule 2  . escitalopram (LEXAPRO) 20 MG tablet Take 1 tablet (20 mg total) by mouth 2 (two) times daily. 180 tablet 2  . HYDROcodone-acetaminophen (NORCO) 5-325 MG tablet Take 1 tablet by mouth every 6 (six) hours as needed for moderate pain. 30 tablet 0  . KRILL OIL PO Take 350 mg by mouth daily.    Marland Kitchen LORazepam (ATIVAN) 1 MG tablet Take 1 tablet (1 mg total) by mouth 3 (three) times daily as needed for anxiety. 90 tablet 5  . losartan (COZAAR) 50 MG tablet TAKE 1 TABLET BY MOUTH EVERY DAY (Patient taking differently: Take 50 mg by mouth daily.) 90 tablet 3  . Multiple Vitamin (MULTIVITAMIN) tablet Take 1 tablet by mouth daily.    Marland Kitchen omeprazole (PRILOSEC) 40 MG capsule Take 1 capsule (40 mg total) by mouth daily. 90 capsule 3  . ondansetron (ZOFRAN) 4 MG tablet Take 1 tablet (4 mg total) by mouth every 8 (eight) hours as needed for nausea or vomiting. 30 tablet 1  . OVER THE COUNTER MEDICATION Take 1 tablet by mouth in the morning and at bedtime. Patient reports that she is taking a ViViscal Tablet by mouth twice daily for hair loss.    . polyethylene glycol (MIRALAX / GLYCOLAX) 17 g packet Take 17 g by mouth daily. (Patient taking differently: Take 17 g by mouth daily as needed for moderate constipation.) 30 each 1  . rosuvastatin (CRESTOR) 20 MG tablet Take 1 tablet (20 mg total) by mouth daily. 90 tablet 3   No current facility-administered medications for this visit.     Musculoskeletal: Strength & Muscle Tone: within normal limits Gait & Station: normal Patient leans: N/A  Psychiatric Specialty Exam: Review of Systems  Constitutional: Positive for fatigue and unexpected weight change.   Gastrointestinal: Positive for abdominal pain.  Psychiatric/Behavioral: The patient is nervous/anxious.   All other systems reviewed and are negative.   There were no vitals taken for this visit.There is no height or weight on file to calculate BMI.  General Appearance: NA  Eye Contact:  NA  Speech:  Clear and Coherent  Volume:  Normal  Mood:  Anxious  Affect:  NA  Thought Process:  Goal Directed  Orientation:  Full (Time, Place, and Person)  Thought Content: Rumination   Suicidal Thoughts:  No  Homicidal Thoughts:  No  Memory:  Immediate;   Good Recent;   Fair Remote;   Fair  Judgement:  Good  Insight:  Fair  Psychomotor Activity:  Decreased  Concentration:  Concentration: Fair and Attention Span: Fair  Recall:  Good  Fund of Knowledge: Good  Language: Good  Akathisia:  No  Handed:  Right  AIMS (if indicated): not done  Assets:  Communication Skills Desire for Improvement Resilience Social Support Talents/Skills  ADL's:  Intact  Cognition: WNL  Sleep:  Good   Screenings: GAD-7   Flowsheet Row Clinical Support from 01/10/2021 in [place]  Total GAD-7 Score 8    PHQ2-9   Flowsheet Row Video Visit from 02/07/2021 in [place] from 01/10/2021 in [place] Office Visit from 06/08/2020 in [place] Office Visit from 02/16/2020  in [facility] Office Visit from 05/14/2018 in [facility]  PHQ-2 Total Score 1 4 1  0 1  PHQ-9 Total Score -- 11 -- -- 4    Flowsheet Row Video Visit from 02/07/2021 in [facility] Admission (Discharged) from 10/12/2020 in [place] No Risk No Risk       Assessment and Plan: Patient is a 70 year old female with a history of depression and a more remote history of alcohol abuse and anxiety.  She continues to have difficulty gaining  weight and has significant fatigue.  I urged her to mention this to her oncologist as she could have some benefit from an iron infusion.  Given the level of anxiety and poor appetite I will add mirtazapine 15 mg at bedtime.  She will continue Lexapro 20 mg twice daily for depression, Ativan 1 mg 3 times daily as needed for anxiety and Depakote ER 500 mg at bedtime for mood stabilization   Abigail Spiller, MD 02/07/2021, 11:22 AM

## 2021-03-01 ENCOUNTER — Other Ambulatory Visit (HOSPITAL_COMMUNITY): Payer: Self-pay | Admitting: Psychiatry

## 2021-03-03 ENCOUNTER — Other Ambulatory Visit: Payer: Self-pay | Admitting: *Deleted

## 2021-03-03 ENCOUNTER — Telehealth: Payer: Self-pay | Admitting: Pharmacist

## 2021-03-03 MED ORDER — HYDROCODONE-ACETAMINOPHEN 5-325 MG PO TABS: 1.0000 | ORAL_TABLET | Freq: Four times a day (QID) | ORAL | 0 refills | Status: DC | PRN

## 2021-03-03 NOTE — Telephone Encounter (Signed)
Received call from patient.   Requested refill on Hydrocodone/APAP.   Ok to refill??  Last office visit 01/10/2021.  Last refill 02/03/2021.

## 2021-03-03 NOTE — Telephone Encounter (Signed)
PDMP reviewed.  Appears patient is on this medication chronically.  Will refill in place of PCP who is out of office.

## 2021-03-03 NOTE — Progress Notes (Signed)
Chronic Care Management Pharmacy Note  03/08/2021 Name:  Abigail Wagner MRN:  [id number] DOB:  07/01/1951  Summary: Initial PharmD visit.  Medication review.  Patient mentions some dizziness and shaky spells.  Does not have record of BP with her.  Recently started Mirtazapine for sleep and seems to be helping with her appetite as she reports some weight gain.  Recommendations/Changes made from today's visit: Monitor BP and report back next month.  Plan: Fu on BP in two weeks first of July. Fu with pharmd in 4 months   Subjective: Abigail Wagner is an 70 y.o. year old female who is a primary patient of Pickard, Cammie Mcgee, MD.  The CCM team was consulted for assistance with disease management and care coordination needs.    Engaged with patient face to face for initial visit in response to provider referral for pharmacy case management and/or care coordination services.   Consent to Services:  The patient was given the following information about Chronic Care Management services today, agreed to services, and gave verbal consent: 1. CCM service includes personalized support from designated clinical staff supervised by the primary care provider, including individualized plan of care and coordination with other care providers 2. 24/7 contact phone numbers for assistance for urgent and routine care needs. 3. Service will only be billed when office clinical staff spend 20 minutes or more in a month to coordinate care. 4. Only one practitioner may furnish and bill the service in a calendar month. 5.The patient may stop CCM services at any time (effective at the end of the month) by phone call to the office staff. 6. The patient will be responsible for cost sharing (co-pay) of up to 20% of the service fee (after annual deductible is met). Patient agreed to services and consent obtained.  Patient Care Team: Susy Frizzle, MD as PCP - General (Family Medicine) Rogene Houston, MD as Consulting  Physician (Gastroenterology) [name], MD as Consulting Physician ([place]) Leighton Ruff, MD as Consulting Physician (General Surgery) Sheldon Silvan Scharlene Corn as Physician Assistant (Oncology) Marybelle Killings, MD as Consulting Physician (Orthopedic Surgery) Edythe Clarity, [facility] as Pharmacist (Pharmacist)  Recent office visits:  None in the last six months.   Recent consult visits:  02/07/21 Behavioral Health (Video Visit) For major depressive disorder. No information available. 12/21/20 Oncology Derek Jack, MD. For cancer. No medication changes. 10/06/20 Gilda Crease, MD. For nausea and vomiting. STARTED Ondansetron 4 mg every 8 hours PRN. STOPPED Albuterol, Asprin, Benzonatate.   Hospital visits:  10/12/20 [facility] (3 Hours) Montez Morita, Quillian Quince, MD. For colonoscopy.   Objective:  Lab Results  Component Value Date   CREATININE 0.77 03/07/2021   BUN 22 03/07/2021   GFRNONAA 78 03/07/2021   GFRAA 91 03/07/2021   NA 144 03/07/2021   K 4.9 03/07/2021   CALCIUM 9.0 03/07/2021   CO2 27 03/07/2021   GLUCOSE 58 (L) 03/07/2021    No results found for: HGBA1C, FRUCTOSAMINE, GFR, MICROALBUR  Last diabetic Eye exam: No results found for: HMDIABEYEEXA  Last diabetic Foot exam: No results found for: HMDIABFOOTEX   Lab Results  Component Value Date   CHOL 152 01/10/2021   HDL 58 01/10/2021   LDLCALC 80 01/10/2021   TRIG 56 01/10/2021   CHOLHDL 2.6 01/10/2021    Hepatic Function Latest Ref Rng & Units 03/07/2021 01/10/2021 12/14/2020  Total Protein 6.1 - 8.1 g/dL 6.1 6.6 6.8  Albumin 3.5 -  5.0 g/dL - - 3.8  AST 10 - 35 U/L 35 23 26  ALT 6 - 29 U/L _0 Alk Phosphatase 38 - 126 U/L - - 68  Total Bilirubin 0.2 - 1.2 mg/dL 0.2 0.2 0.2(L)     CBC Latest Ref Rng & Units 03/07/2021 12/14/2020 07/22/2020  WBC 3.8 - 10.8 Thousand/uL 3.5(L) 5.3 4.7  Hemoglobin 11.7 - 15.5 g/dL 10.4(L) 11.8(L) 12.1  Hematocrit 35.0 -  45.0 % 31.1(L) 35.2(L) 36.3  Platelets 140 - 400 Thousand/uL 167 188 255    Lab Results  Component Value Date/Time   VD25OH 47.0 01/10/2017 03:02 PM    Clinical ASCVD: No  The 10-year ASCVD risk score Mikey Bussing DC Jr., et al., 2013) is: 15.2%   Values used to calculate the score:     Age: 81 years     Sex: Female     Is Non-Hispanic African American: No     Diabetic: No     Tobacco smoker: Yes     Systolic Blood Pressure: 488 mmHg     Is BP treated: Yes     HDL Cholesterol: 58 mg/dL     Total Cholesterol: 152 mg/dL    Depression screen [facility] 2/9 02/07/2021 01/10/2021 01/10/2021  Decreased Interest 1 2 0  Down, Depressed, Hopeless 0 2 -  PHQ - 2 Score 1 4 0  Altered sleeping - 2 -  Tired, decreased energy - 2 -  Change in appetite - 1 -  Feeling bad or failure about yourself  - 1 -  Trouble concentrating - 0 -  Moving slowly or fidgety/restless - 1 -  Suicidal thoughts - 0 -  PHQ-9 Score - 11 -  Difficult doing work/chores - - -  Some recent data might be hidden      Social History   Tobacco Use  Smoking Status Every Day   Packs/day: 0.50   Years: 43.00   Pack years: 21.50   Types: Cigarettes  Smokeless Tobacco Never   BP Readings from Last 3 Encounters:  03/07/21 116/76  01/10/21 114/80  12/21/20 118/62   Pulse Readings from Last 3 Encounters:  03/07/21 66  12/21/20 81  12/14/20 84   Wt Readings from Last 3 Encounters:  03/07/21 122 lb (55.3 kg)  01/10/21 119 lb 14.4 oz (54.4 kg)  12/21/20 111 lb 9.6 oz (50.6 kg)   BMI Readings from Last 3 Encounters:  03/07/21 20.30 kg/m  01/10/21 19.95 kg/m  12/21/20 18.57 kg/m    Assessment/Interventions: Review of patient past medical history, allergies, medications, health status, including review of consultants reports, laboratory and other test data, was performed as part of comprehensive evaluation and provision of chronic care management services.   SDOH:  (Social Determinants of Health) assessments and  interventions performed: Yes  Financial Resource Strain: Low Risk    Difficulty of Paying Living Expenses: Not very hard    SDOH Screenings   Alcohol Screen: Not on file  Depression (PHQ2-9): Low Risk    PHQ-2 Score: 1  Financial Resource Strain: Low Risk    Difficulty of Paying Living Expenses: Not very hard  Food Insecurity: Not on file  Housing: Not on file  Physical Activity: Not on file  Social Connections: Not on file  Stress: Not on file  Tobacco Use: High Risk   Smoking Tobacco Use: Every Day   Smokeless Tobacco Use: Never  Transportation Needs: Not on file    [place]  Allergies  Allergen Reactions  Morphine And Related     Delirium with sbo    Medications Reviewed Today     Reviewed by Edythe Clarity, [facility] (Pharmacist) on 03/08/21 at 1023  Med List Status: <None>   Medication Order Taking? Sig Documenting Provider Last Dose Status Informant  b complex vitamins tablet [id number] Yes Take 1 tablet by mouth daily. [provider] Taking Active Self  Cholecalciferol (VITAMIN D-3) 125 MCG (5000 UT) TABS [id number] Yes Take 5,000 Units by mouth daily. [provider] Taking Active Self  Coenzyme Q10 (CO Q 10) 100 MG CAPS [id number] Yes Take 100 mg by mouth daily. [provider] Taking Active Self  Collagen-Vitamin C-Biotin (COLLAGEN 1500/C PO) [id number] Yes Take by mouth. [provider] Taking Active   divalproex (DEPAKOTE ER) 500 MG 24 hr tablet [id number] Yes Take 1 tablet (500 mg total) by mouth daily. [name], MD Taking Active   docusate sodium (COLACE) 100 MG capsule [id number] Yes Take 1 capsule (100 mg total) by mouth daily as needed for moderate constipation. Barton Dubois, MD Taking Active Self  escitalopram (LEXAPRO) 20 MG tablet [id number] Yes Take 1 tablet (20 mg total) by mouth 2 (two) times daily. [name], MD Taking Active   HORSE CHESTNUT ER PO [id number] Yes Take by mouth. [provider]  Taking Active   HYDROcodone-acetaminophen (NORCO) 5-325 MG tablet [id number] Yes Take 1 tablet by mouth every 6 (six) hours as needed for moderate pain. Eulogio Bear, NP Taking Active   KRILL OIL PO [id number] Yes Take 350 mg by mouth daily. [provider] Taking Active Self  LORazepam (ATIVAN) 1 MG tablet [id number] Yes Take 1 tablet (1 mg total) by mouth 3 (three) times daily as needed for anxiety. [name], MD Taking Active   losartan (COZAAR) 50 MG tablet [id number] No TAKE 1 TABLET BY MOUTH EVERY DAY  Patient not taking: Reported on 03/08/2021   Susy Frizzle, MD Not Taking Active   mirtazapine (REMERON) 15 MG tablet [id number] Yes TAKE 1 TABLET BY MOUTH EVERYDAY AT BEDTIME [name], MD Taking Active   Multiple Vitamin (MULTIVITAMIN) tablet [id number] Yes Take 1 tablet by mouth daily. [provider] Taking Active Self  Multiple Vitamins-Minerals (HAIR SKIN & NAILS ADVANCED PO) [id number] Yes Take by mouth. [provider] Taking Active   omeprazole (PRILOSEC) 40 MG capsule [id number] Yes Take 1 capsule (40 mg total) by mouth daily. Susy Frizzle, MD Taking Active   ondansetron [facility]) 4 MG tablet [id number] Yes Take 1 tablet (4 mg total) by mouth every 8 (eight) hours as needed for nausea or vomiting. Montez Morita, Quillian Quince, MD Taking Active Self  OVER THE COUNTER MEDICATION [id number] Yes Take 1 tablet by mouth in the morning and at bedtime. Patient reports that she is taking a ViViscal Tablet by mouth twice daily for hair loss. [provider] Taking Active Self  polyethylene glycol (MIRALAX / GLYCOLAX) 17 g packet [id number] Yes Take 17 g by mouth daily.  Patient taking differently: Take 17 g by mouth daily as needed for moderate constipation.   Barton Dubois, MD Taking Active Self           Med Note Thomas Hoff M   Wed Oct 06, 2020  3:09 PM) Patient and Husband report that she takes as needed.  rosuvastatin (CRESTOR) 20 MG tablet  [id number] Yes Take 1 tablet (20 mg total) by mouth daily. Susy Frizzle, MD Taking Active   Med List  Note Lyndal Rainbow, CPhT 05/25/20 1635): Patient received Moderna vaccination 12/2019 at [facility] per the spouse-Unable to confirm the exact date or Lot numbers at this time            Patient Active Problem List   Diagnosis Date Noted   Chronic diarrhea 10/06/2020   Chronic prescription opiate use 10/06/2020   SBO (small bowel obstruction) ([place]) 05/25/2020   Vulvar irritation 03/10/2020   Internal carotid artery stenosis, left    Neural foraminal stenosis of lumbar spine 05/16/2018   Genetic testing 05/24/2017   Vaginal infection 09/27/2016   Essential hypertension 09/27/2016   GERD (gastroesophageal reflux disease) 09/27/2016   Back pain 09/27/2016   Anxiety 09/27/2016   Allergy 09/27/2016   Radiation proctitis 12/23/2015   Anal cancer ([place]) 11/19/2015   History of ovarian cancer 08/03/2015   Prolapse of vaginal vault after hysterectomy 07/20/2015   Port-A-Cath in place 01/13/2015   Nausea with vomiting 10/23/2014   Epithelial ovarian cancer, FIGO stage IIIA ([place]) 07/16/2014   Abdominal carcinomatosis ([place]) 06/18/2014   Bipolar 1 disorder ([place]) 12/27/2011   Kidney stones 12/27/2011   Depression 11/07/2011    Immunization History  Administered Date(s) Administered   Fluad Quad(high Dose 65+) 08/16/2020   Influenza, High Dose Seasonal PF 07/17/2017, 07/09/2018, 06/16/2019   Influenza-Unspecified 10/04/2016   Moderna Sars-Covid-2 Vaccination 12/25/2019, 01/24/2020, 08/16/2020   Pneumococcal Conjugate-13 11/07/2016   Pneumococcal Polysaccharide-23 05/14/2018   Tdap 09/25/2008    Conditions to be addressed/monitored:  HTN, Migraines, GERD, Depression/Bipolar, HLD  Care Plan : General Pharmacy (Adult)  Updates made by Edythe Clarity, RPH since 03/08/2021 12:00 AM     Problem: HTN, Migraines, GERD, Depression/Bipolar, HLD   Priority: High   Onset Date: 03/07/2021     Long-Range Goal: Patient-Specific Goal   Start Date: 03/07/2021  Expected End Date: 09/07/2021  This Visit's Progress: On track  Priority: High  Note:   Current Barriers:  Unable to independently monitor therapeutic efficacy Unable to achieve control of dizziness/shaky spells   Pharmacist Clinical Goal(s):  Patient will achieve adherence to monitoring guidelines and medication adherence to achieve therapeutic efficacy achieve control of dizziness as evidenced by symptoms adhere to prescribed medication regimen as evidenced by fill dates contact provider office for questions/concerns as evidenced notation of same in electronic health record through collaboration with PharmD and provider.   Interventions: 1:1 collaboration with Susy Frizzle, MD regarding development and update of comprehensive plan of care as evidenced by provider attestation and co-signature Inter-disciplinary care team collaboration (see longitudinal plan of care) Comprehensive medication review performed; medication list updated in electronic medical record  Hypertension (BP goal <140/90) -Controlled -Current treatment: Losartan 4m daily -Medications previously tried: none noted  -Current home readings: no logs today -Current exercise habits: minimal due to pain -Reports hypotensive/hypertensive symptoms -Educated on BP goals and benefits of medications for prevention of heart attack, stroke and kidney damage; Importance of home blood pressure monitoring; Symptoms of hypotension and importance of maintaining adequate hydration; -Counseled to monitor BP at home a few times per week, document, and provide log at future appointments -Recommended to continue current medication -BP may be getting too low causing dizziness, asked patient to record and will f/u  Hyperlipidemia: (LDL goal < 100) -Controlled -Current treatment: Rosuvastatin 249mdaily -Medications previously tried:  none noted  -Current dietary patterns: patient has not been eating as much as of late, however just started on Mirtazapine and this seems to be improving her appetite -  Current exercise habits: minimal exercise -Educated on Cholesterol goals;  Benefits of statin for ASCVD risk reduction; Importance of limiting foods high in cholesterol; -Recommended to continue current medication  Depression/Anxiety (Goal: Minimize symptoms) -Controlled -Current treatment: Excitalopram 79m daily Lorazepam 160mtid prn Depakote ER 50019maily -Medications previously tried/failed: none noted -PHQ9:  Flowsheet Row Clinical Support from 01/10/2021 in [facility] Total Score 11     -GAD7:  GAD 7 : Generalized Anxiety Score 01/10/2021  Nervous, Anxious, on Edge 2  Control/stop worrying 1  Worry too much - different things 2  Trouble relaxing 1  Restless 1  Easily annoyed or irritable 1  Afraid - awful might happen 0  Total GAD 7 Score 8  Anxiety Difficulty Very difficult  -She reports her mood has been controlled and seems to be leveling off -Recommended to continue current medication   GERD (Goal: Control symptoms) -Controlled -Current treatment  Omeprazole 62m18mily -Medications previously tried: none noted -Denies any symptoms, working on trigger foods -Reports accurate medication administration 30 minutes before food or drink on empty stomach  -Recommended to continue current medication  Patient Goals/Self-Care Activities Patient will:  - take medications as prescribed focus on medication adherence by fill dates check blood pressure a few times per week, document, and provide at future appointments  Follow Up Plan: The care management team will reach out to the patient again over the next 120 days.       Medication Assistance: None required.  Patient affirms current coverage meets needs.  Compliance/Adherence/Medication fill history: Care Gaps: Zoster  vaccines  Star-Rating Drugs: Losartan 50mg54m/c today by PCP  Patient's preferred pharmacy is:  [id number] [place] [address] [address]: [phone] pill box? Yes Pt endorses 100% compliance  We discussed: Benefits of medication synchronization, packaging and delivery as well as enhanced pharmacist oversight with [employer]. Patient decided to: Continue current medication management strategy  Care Plan and Follow Up Patient Decision:  Patient agrees to Care Plan and Follow-up.  Plan: The care management team will reach out to the patient again over the next 20 days.  ChristBeverly MilchmD Clinical Pharmacist [place] [phone]

## 2021-03-03 NOTE — Progress Notes (Addendum)
Chronic Care Management Pharmacy Assistant   Name: Abigail Wagner  MRN: [id number] DOB: 06/05/1951  Abigail Wagner is an 70 y.o. year old female who presents for his initial CCM visit with the clinical pharmacist.  Reason for Encounter: Chart Prep    Conditions to be addressed/monitored: Migraines, HTN, IBS, GERD, Depression, Anxiety, HLD.  Primary concerns for visit include: HTN  Recent office visits:  None in the last six months.  Recent consult visits:  02/07/21 Behavioral Health (Video Visit) For major depressive disorder. No information available.  12/21/20 Oncology Derek Jack, MD. For cancer. No medication changes. 10/06/20 Gilda Crease, MD. For nausea and vomiting. STARTED Ondansetron 4 mg every 8 hours PRN. STOPPED Albuterol, Asprin, Benzonatate.   Hospital visits:  10/12/20 [facility] (3 Hours) Montez Morita, Quillian Quince, MD. For colonoscopy.   Medication History: Rosuvastatin 20 mg 90 DS 02/05/21 Losartan 50 mg 90 DS 01/16/21  Medications: Outpatient Encounter Medications as of 03/03/2021  Medication Sig Note   b complex vitamins tablet Take 1 tablet by mouth daily.    Cholecalciferol (VITAMIN D-3) 125 MCG (5000 UT) TABS Take 5,000 Units by mouth daily.    Coenzyme Q10 (CO Q 10) 100 MG CAPS Take 100 mg by mouth daily.    divalproex (DEPAKOTE ER) 500 MG 24 hr tablet Take 1 tablet (500 mg total) by mouth daily.    docusate sodium (COLACE) 100 MG capsule Take 1 capsule (100 mg total) by mouth daily as needed for moderate constipation.    escitalopram (LEXAPRO) 20 MG tablet Take 1 tablet (20 mg total) by mouth 2 (two) times daily.    HYDROcodone-acetaminophen (NORCO) 5-325 MG tablet Take 1 tablet by mouth every 6 (six) hours as needed for moderate pain.    KRILL OIL PO Take 350 mg by mouth daily.    LORazepam (ATIVAN) 1 MG tablet Take 1 tablet (1 mg total) by mouth 3 (three) times daily as needed for anxiety.    losartan (COZAAR) 50 MG  tablet TAKE 1 TABLET BY MOUTH EVERY DAY (Patient taking differently: Take 50 mg by mouth daily.)    mirtazapine (REMERON) 15 MG tablet TAKE 1 TABLET BY MOUTH EVERYDAY AT BEDTIME    Multiple Vitamin (MULTIVITAMIN) tablet Take 1 tablet by mouth daily.    omeprazole (PRILOSEC) 40 MG capsule Take 1 capsule (40 mg total) by mouth daily.    ondansetron (ZOFRAN) 4 MG tablet Take 1 tablet (4 mg total) by mouth every 8 (eight) hours as needed for nausea or vomiting.    OVER THE COUNTER MEDICATION Take 1 tablet by mouth in the morning and at bedtime. Patient reports that she is taking a ViViscal Tablet by mouth twice daily for hair loss.    polyethylene glycol (MIRALAX / GLYCOLAX) 17 g packet Take 17 g by mouth daily. (Patient taking differently: Take 17 g by mouth daily as needed for moderate constipation.) 10/06/2020: Patient and Husband report that she takes as needed.   rosuvastatin (CRESTOR) 20 MG tablet Take 1 tablet (20 mg total) by mouth daily.    No facility-administered encounter medications on file as of 03/03/2021.    Have you seen any other providers since your last visit? Patient stated she seen a ear specialist.   Any changes in your medications or health? Patient stated she was given hearing aids.  Any side effects from any medications? Patient stated no.  Do you have an symptoms or problems not managed by your medications? Patient  stated no.  Any concerns about your health right now? Patient stated she has been noticing her hands are shaking and she drops some things.  Has your provider asked that you check blood pressure, blood sugar, or follow special diet at home? Patient stated she monitors her blood pressure.  Do you get any type of exercise on a regular basis? Patient stated she walks 3 times a day und down the steps.   Can you think of a goal you would like to reach for your health? Patient stated she would like to gain more weight.   Do you have any problems getting your  medications? Patient stated no.  Is there anything that you would like to discuss during the appointment? Patient stated no.  Please bring medications and supplements to appointment, patient reminded of her face to face appointment on 03/07/21 at 2 pm.   [place], [place] Pharmacist Assistant [phone]

## 2021-03-07 ENCOUNTER — Encounter: Payer: Self-pay | Admitting: Family Medicine

## 2021-03-07 ENCOUNTER — Ambulatory Visit (INDEPENDENT_AMBULATORY_CARE_PROVIDER_SITE_OTHER): Payer: Medicare Other | Admitting: Pharmacist

## 2021-03-07 ENCOUNTER — Ambulatory Visit (INDEPENDENT_AMBULATORY_CARE_PROVIDER_SITE_OTHER): Payer: Medicare Other | Admitting: Family Medicine

## 2021-03-07 ENCOUNTER — Other Ambulatory Visit: Payer: Self-pay

## 2021-03-07 VITALS — BP 116/76 | HR 66 | Temp 97.8°F | Resp 14 | Ht 65.0 in | Wt 122.0 lb

## 2021-03-07 DIAGNOSIS — E785 Hyperlipidemia, unspecified: Secondary | ICD-10-CM

## 2021-03-07 DIAGNOSIS — F32A Depression, unspecified: Secondary | ICD-10-CM

## 2021-03-07 DIAGNOSIS — E441 Mild protein-calorie malnutrition: Secondary | ICD-10-CM

## 2021-03-07 DIAGNOSIS — R531 Weakness: Secondary | ICD-10-CM | POA: Diagnosis not present

## 2021-03-07 DIAGNOSIS — I10 Essential (primary) hypertension: Secondary | ICD-10-CM

## 2021-03-07 DIAGNOSIS — R6889 Other general symptoms and signs: Secondary | ICD-10-CM | POA: Diagnosis not present

## 2021-03-07 DIAGNOSIS — G8929 Other chronic pain: Secondary | ICD-10-CM | POA: Diagnosis not present

## 2021-03-07 DIAGNOSIS — M549 Dorsalgia, unspecified: Secondary | ICD-10-CM

## 2021-03-07 DIAGNOSIS — D539 Nutritional anemia, unspecified: Secondary | ICD-10-CM | POA: Diagnosis not present

## 2021-03-07 DIAGNOSIS — R5382 Chronic fatigue, unspecified: Secondary | ICD-10-CM | POA: Diagnosis not present

## 2021-03-07 DIAGNOSIS — F319 Bipolar disorder, unspecified: Secondary | ICD-10-CM

## 2021-03-07 NOTE — Progress Notes (Signed)
Subjective:    Patient ID: Abigail Wagner, female    DOB: July 12, 1951, 70 y.o.   MRN: [id number]  HPI  Wt Readings from Last 3 Encounters:  03/07/21 122 lb (55.3 kg)  01/10/21 119 lb 14.4 oz (54.4 kg)  12/21/20 111 lb 9.6 oz (50.6 kg)   I have not seen the patient since October of last year.  Today she appears quite emaciated compared to the last time that I saw her.  However comparing her most recent weight, she is actually improved!  Last year she was over 140 pounds.  Due to recurrent abdominal pain and diverticulitis, her weight dropped to a staggering 111 pounds by March.  It is gradually improved up to 122 pounds today.  However she still looks very frail and weak and malnourished.  She is here today with her husband.  They both state that she is having "shaking spells".  Both arms will begin to shake like a tremor.  She will feel extremely weak.  This can last minutes and then gradually improve.  It also can involve her legs.  It does not sound like a seizure.  She does not lose consciousness.  She does not have any postictal phase.  She denies any bowel or bladder incontinence.  She describes it is more of a "Parkinson tremor".  She will notice gentle shaking in her extremities.  She also reports dizziness and lightheadedness and weakness.  Her blood pressure will drop at home as low as 90 systolic.  She also complains of shooting back pain.  The pain begins in the mid thoracic spine and radiates down to her tailbone on either side of the spine.  She states that the pain is sharp and intense.  She denies any bowel or bladder incontinence.  She denies any saddle anesthesias or numbness or tingling in her extremities.   Past Medical History:  Diagnosis Date   Allergy    Anal carcinoma [facility]) oncologist-  dr Whitney Muse ([facility])   dx 12/ 2016 [facility] --  chemo and radiation- Dr. Marcello Moores   Anemia    due to her cancer   Anxiety    Dr. Harrington Challenger at [place] (psychiatrist).     Carcinoma of ovary,  stage 3 [facility]) oncologist-  dr gehrig/  dr Doreene Eland (cancer center in [place] [facility])--  no recurrency   dx 09/ 2015  Stage IIIB  papillary ovarian carcinoma  s/p  omentectomy and BSO &  chemotherapy (completed 12-07-2014)   Chemotherapy induced nausea and vomiting 10/23/2014   Chronic kidney disease    stones   DDD (degenerative disc disease), lumbosacral    GERD (gastroesophageal reflux disease)    Headache(784.0)    History of acute respiratory failure    05-30-2009  drug overdose-- (intubated for 2 days)  and aspiration pneumonia   History of adenomatous polyp of colon 10/07/2015   tubular adenoma high grade dysplasia   History of herpes genitalis    History of kidney stones    History of ovarian cancer 08/03/2015   History of suicide attempt    per documentation in epic  05-30-2009  overdose benaodiazepine   HTN (hypertension)    takes Metoprolol daily   Hyperlipidemia    Hypertension 09/27/2016   IBS (irritable bowel syndrome)    takes Bentyl daily   Internal carotid artery stenosis, left    <50%, ulcerative plaque at carotid bulb.    Major depression    Mood disorder ([place])  OA (osteoarthritis)    both hips   Ovarian cancer ([place])    2015-TAH/BSO   Prolapse of vaginal vault after hysterectomy 07/20/2015   Rectovaginal fistula 08/05/2015   Sigmoid diverticulosis    Smokers' cough [facility])     Past Surgical History:  Procedure Laterality Date   BIOPSY  10/12/2020   Procedure: BIOPSY;  Surgeon: Harvel Quale, MD;  [facility];  Service: Gastroenterology;;   BREAST ENHANCEMENT [place]   COLONOSCOPY N/A 10/07/2015   Procedure: COLONOSCOPY;  Surgeon: Rogene Houston, MD;  [facility];  Service: Endoscopy;  Laterality: N/A;  7:30   COLONOSCOPY WITH PROPOFOL N/A 10/12/2020   Procedure: COLONOSCOPY WITH PROPOFOL;  Surgeon: Harvel Quale, MD;  [facility];  Service: Gastroenterology;   Laterality: N/A;  9:00   ESOPHAGOGASTRODUODENOSCOPY (EGD) WITH PROPOFOL N/A 10/12/2020   Procedure: ESOPHAGOGASTRODUODENOSCOPY (EGD) WITH PROPOFOL;  Surgeon: Harvel Quale, MD;  [facility];  Service: Gastroenterology;  Laterality: N/A;   EXPLORATORY LAPAROTOMY/ OMENTECTOMY/  BILATERAL SALPINGOOPHORECTOMY/  PORT-A-CATH PLACEMENT  07-03-2014   [place]   KNEE ARTHROSCOPY Left 09-22-2004   LAPAROSCOPY N/A 06/02/2014   Procedure: DIAGNOSTIC LAPAROSCOPY, OMENTAL BIOPSY, RIGHT OVARY BIOPSY, LYSIS OF ADHESIONS;  Surgeon: Fanny Skates, MD;  Location: [place];  Service: General;  Laterality: N/A;   MUCOSAL ADVANCEMENT FLAP N/A 06/15/2016   Procedure: EXCISION RECTOVAGINAOL FISTULA WITH MUCOSAL ADVANCEMENT FLAP;  Surgeon: Leighton Ruff, MD;  Location: [place];  Service: General;  Laterality: N/A;   [place] N/A 09/09/2015   Procedure: PLACEMENT OF SETON;  Surgeon: Leighton Ruff, MD;  Location[facility];  Service: General;  Laterality: N/A;   PORT-A-CATH REMOVAL Right 08/17/2014   Procedure: REMOVAL INTRAPERITONEAL CHEMO PORT;  Surgeon: Fanny Skates, MD;  Location: [place];  Service: General;  Laterality: Right;   PORTACATH PLACEMENT Right 08/17/2014   Procedure:  PLACE NEW PORT A CATH;  Surgeon: Fanny Skates, MD;  Location: [place];  Service: General;  Laterality: Right;   RECTAL BIOPSY N/A 09/09/2015   Procedure: BIOPSY OF RECTOVAGINAL MASS;  Surgeon: Leighton Ruff, MD;  Location: [place];  Service: General;  Laterality: N/A;   TUBAL LIGATION  YRS AGO   VAGINAL HYSTERECTOMY  1981   fibroids   Current Outpatient Medications on File Prior to Visit  Medication Sig Dispense Refill   b complex vitamins tablet Take 1 tablet by mouth daily.     Cholecalciferol (VITAMIN D-3) 125 MCG (5000 UT) TABS Take 5,000 Units by mouth daily.     Coenzyme Q10 (CO Q 10) 100 MG CAPS Take 100 mg  by mouth daily.     divalproex (DEPAKOTE ER) 500 MG 24 hr tablet Take 1 tablet (500 mg total) by mouth daily. 90 tablet 3   docusate sodium (COLACE) 100 MG capsule Take 1 capsule (100 mg total) by mouth daily as needed for moderate constipation. 30 capsule 2   escitalopram (LEXAPRO) 20 MG tablet Take 1 tablet (20 mg total) by mouth 2 (two) times daily. 180 tablet 2   HYDROcodone-acetaminophen (NORCO) 5-325 MG tablet Take 1 tablet by mouth every 6 (six) hours as needed for moderate pain. 30 tablet 0   KRILL OIL PO Take 350 mg by mouth daily.     LORazepam (ATIVAN) 1 MG tablet Take 1 tablet (1 mg total) by mouth 3 (three) times daily as  needed for anxiety. 90 tablet 5   losartan (COZAAR) 50 MG tablet TAKE 1 TABLET BY MOUTH EVERY DAY (Patient taking differently: Take 50 mg by mouth daily.) 90 tablet 3   mirtazapine (REMERON) 15 MG tablet TAKE 1 TABLET BY MOUTH EVERYDAY AT BEDTIME 90 tablet 1   Multiple Vitamin (MULTIVITAMIN) tablet Take 1 tablet by mouth daily.     omeprazole (PRILOSEC) 40 MG capsule Take 1 capsule (40 mg total) by mouth daily. 90 capsule 3   ondansetron (ZOFRAN) 4 MG tablet Take 1 tablet (4 mg total) by mouth every 8 (eight) hours as needed for nausea or vomiting. 30 tablet 1   OVER THE COUNTER MEDICATION Take 1 tablet by mouth in the morning and at bedtime. Patient reports that she is taking a ViViscal Tablet by mouth twice daily for hair loss.     polyethylene glycol (MIRALAX / GLYCOLAX) 17 g packet Take 17 g by mouth daily. (Patient taking differently: Take 17 g by mouth daily as needed for moderate constipation.) 30 each 1   rosuvastatin (CRESTOR) 20 MG tablet Take 1 tablet (20 mg total) by mouth daily. 90 tablet 3   No current facility-administered medications on file prior to visit.   Allergies  Allergen Reactions   Morphine And Related     Delirium with sbo   Social History   Socioeconomic History   Marital status: Married    Spouse name: Coralyn Mark   Number of children:  3   Years of education: 12   Highest education level: Not on file  Occupational History   Occupation: retire    Comment: [employer]  Tobacco Use   Smoking status: Every Day    Packs/day: 0.50    Years: 43.00    Pack years: 21.50    Types: Cigarettes   Smokeless tobacco: Never  Vaping Use   Vaping Use: Never used  Substance and Sexual Activity   Alcohol use: No   Drug use: No   Sexual activity: Not Currently    Birth control/protection: Surgical    Comment: hyst  Other Topics Concern   Not on file  Social History Narrative   Lives at home with Coralyn Mark   Retired [employer]   Social Determinants of Health   Financial Resource Strain: Not on file  Food Insecurity: Not on file  Transportation Needs: Not on file  Physical Activity: Not on file  Stress: Not on file  Social Connections: Not on file  Intimate Partner Violence: Not on file   Family History  Problem Relation Age of Onset   Bipolar disorder Mother    Anxiety disorder Mother    Dementia Mother    Depression Mother    Mental illness Mother    Vision loss Mother    Alzheimer's disease Mother    Alcohol abuse Paternal Uncle    Bipolar disorder Maternal Grandmother    Dementia Maternal Grandmother    Alzheimer's disease Maternal Grandmother    Alcohol abuse Paternal Uncle    Stroke Brother    Deep vein thrombosis Son    Heart disease Father    Hyperlipidemia Father    Hypertension Father    Stroke Father    Vision loss Father    Atrial fibrillation Sister    Heart disease Brother    Heart disease Brother    ADD / ADHD Neg Hx    Drug abuse Neg Hx    OCD Neg Hx    Paranoid behavior Neg Hx  Schizophrenia Neg Hx    Seizures Neg Hx    Sexual abuse Neg Hx    Physical abuse Neg Hx       Review of Systems     Objective:   Physical Exam Vitals reviewed.  Constitutional:      General: She is not in acute distress.    Appearance: She is not diaphoretic.  Cardiovascular:     Rate and Rhythm:  Normal rate and regular rhythm.     Heart sounds: Normal heart sounds. No murmur heard.   No friction rub. No gallop.  Pulmonary:     Effort: Pulmonary effort is normal. No respiratory distress.     Breath sounds: Normal breath sounds. No stridor. No wheezing or rales.  Abdominal:     General: Abdomen is flat. Bowel sounds are normal. There is no distension.     Palpations: Abdomen is soft. There is no mass.     Tenderness: There is no abdominal tenderness.  Neurological:     Mental Status: She is alert.          Assessment & Plan:  Chronic fatigue - Plan: Vitamin B12, Iron, CBC with Differential/Platelet, COMPLETE METABOLIC PANEL WITH GFR, TSH  Mid back pain, chronic - Plan: DG Lumbar Spine Complete, DG Thoracic Spine W/Swimmers  Weakness  Mild protein-calorie malnutrition ([place]) Patient has been avoiding certain foods and has been eating sparingly due to the abdominal pain now since September.  I believe that she has developed protein calorie malnutrition and has generalized weakness secondary to this likely causing a tremor.  Given her history of anal carcinoma as well as ovarian carcinoma and the new back pain, I have recommended thoracic spine x-rays and lumbar spine x-rays to rule out any evidence of bone metastasis or vertebral fractures due to malnourishment.  I will also check a vitamin B12 level, and iron level, CBC, TSH, and a CMP.  I believe that her blood pressure is too low likely causing dizziness and weakness so I recommended stopping losartan.  Await the results of her x-rays and the lab work.  Encouraged her to eat a protein supplement such as Ensure and to increase her protein intake including chicken and fish and [place] as well as beans and dairy products such as cheese and yogurt.

## 2021-03-08 LAB — CBC WITH DIFFERENTIAL/PLATELET
Absolute Monocytes: 420 cells/uL (ref 200–950)
Basophils Absolute: 39 cells/uL (ref 0–200)
Basophils Relative: 1.1 %
Eosinophils Absolute: 102 cells/uL (ref 15–500)
Eosinophils Relative: 2.9 %
HCT: 31.1 % — ABNORMAL LOW (ref 35.0–45.0)
Hemoglobin: 10.4 g/dL — ABNORMAL LOW (ref 11.7–15.5)
Lymphs Abs: 1365 cells/uL (ref 850–3900)
MCH: 32.4 pg (ref 27.0–33.0)
MCHC: 33.4 g/dL (ref 32.0–36.0)
MCV: 96.9 fL (ref 80.0–100.0)
MPV: 10.6 fL (ref 7.5–12.5)
Monocytes Relative: 12 %
Neutro Abs: 1575 cells/uL (ref 1500–7800)
Neutrophils Relative %: 45 %
Platelets: 167 10*3/uL (ref 140–400)
RBC: 3.21 10*6/uL — ABNORMAL LOW (ref 3.80–5.10)
RDW: 14.5 % (ref 11.0–15.0)
Total Lymphocyte: 39 %
WBC: 3.5 10*3/uL — ABNORMAL LOW (ref 3.8–10.8)

## 2021-03-08 LAB — COMPLETE METABOLIC PANEL WITH GFR
AG Ratio: 1.8 (calc) (ref 1.0–2.5)
ALT: 23 U/L (ref 6–29)
AST: 35 U/L (ref 10–35)
Albumin: 3.9 g/dL (ref 3.6–5.1)
Alkaline phosphatase (APISO): 44 U/L (ref 37–153)
BUN: 22 mg/dL (ref 7–25)
CO2: 27 mmol/L (ref 20–32)
Calcium: 9 mg/dL (ref 8.6–10.4)
Chloride: 109 mmol/L (ref 98–110)
Creat: 0.77 mg/dL (ref 0.60–0.93)
GFR, Est African American: 91 mL/min/{1.73_m2} (ref >=60)
GFR, Est Non African American: 78 mL/min/{1.73_m2} (ref >=60)
Globulin: 2.2 g/dL (calc) (ref 1.9–3.7)
Glucose, Bld: 58 mg/dL — ABNORMAL LOW (ref 65–99)
Potassium: 4.9 mmol/L (ref 3.5–5.3)
Sodium: 144 mmol/L (ref 135–146)
Total Bilirubin: 0.2 mg/dL (ref 0.2–1.2)
Total Protein: 6.1 g/dL (ref 6.1–8.1)

## 2021-03-08 LAB — TSH: TSH: 1.82 mIU/L (ref 0.40–4.50)

## 2021-03-08 LAB — IRON: Iron: 72 ug/dL (ref 45–160)

## 2021-03-08 LAB — VITAMIN B12: Vitamin B-12: 388 pg/mL (ref 200–1100)

## 2021-03-08 NOTE — Patient Instructions (Addendum)
Visit Information   Goals Addressed             This Visit's Progress    Track and Manage My Blood Pressure-Hypertension       Timeframe:  Long-Range Goal Priority:  High Start Date:   03/08/21                          Expected End Date:     09/07/21                  Follow Up Date 06/24/21    - check blood pressure 3 times per week - choose a place to take my blood pressure (home, clinic or office, retail store) - write blood pressure results in a log or diary    Why is this important?   You won't feel high blood pressure, but it can still hurt your blood vessels.  High blood pressure can cause heart or kidney problems. It can also cause a stroke.  Making lifestyle changes like losing a little weight or eating less salt will help.  Checking your blood pressure at home and at different times of the day can help to control blood pressure.  If the doctor prescribes medicine remember to take it the way the doctor ordered.  Call the office if you cannot afford the medicine or if there are questions about it.     Notes:         Patient Care Plan: General Pharmacy (Adult)     Problem Identified: HTN, Migraines, GERD, Depression/Bipolar, HLD   Priority: High  Onset Date: 03/07/2021     Long-Range Goal: Patient-Specific Goal   Start Date: 03/07/2021  Expected End Date: 09/07/2021  This Visit's Progress: On track  Priority: High  Note:   Current Barriers:  Unable to independently monitor therapeutic efficacy Unable to achieve control of dizziness/shaky spells   Pharmacist Clinical Goal(s):  Patient will achieve adherence to monitoring guidelines and medication adherence to achieve therapeutic efficacy achieve control of dizziness as evidenced by symptoms adhere to prescribed medication regimen as evidenced by fill dates contact provider office for questions/concerns as evidenced notation of same in electronic health record through collaboration with PharmD and provider.    Interventions: 1:1 collaboration with Susy Frizzle, MD regarding development and update of comprehensive plan of care as evidenced by provider attestation and co-signature Inter-disciplinary care team collaboration (see longitudinal plan of care) Comprehensive medication review performed; medication list updated in electronic medical record  Hypertension (BP goal <140/90) -Controlled -Current treatment: Losartan 50mg  daily -Medications previously tried: none noted  -Current home readings: no logs today -Current exercise habits: minimal due to pain -Reports hypotensive/hypertensive symptoms -Educated on BP goals and benefits of medications for prevention of heart attack, stroke and kidney damage; Importance of home blood pressure monitoring; Symptoms of hypotension and importance of maintaining adequate hydration; -Counseled to monitor BP at home a few times per week, document, and provide log at future appointments -Recommended to continue current medication -BP may be getting too low causing dizziness, asked patient to record and will f/u  Hyperlipidemia: (LDL goal < 100) -Controlled -Current treatment: Rosuvastatin 20mg  daily -Medications previously tried: none noted  -Current dietary patterns: patient has not been eating as much as of late, however just started on Mirtazapine and this seems to be improving her appetite -Current exercise habits: minimal exercise -Educated on Cholesterol goals;  Benefits of statin for ASCVD risk reduction; Importance of  limiting foods high in cholesterol; -Recommended to continue current medication  Depression/Anxiety (Goal: Minimize symptoms) -Controlled -Current treatment: Excitalopram 20mg  daily Lorazepam 1mg  tid prn Depakote ER 500mg  daily -Medications previously tried/failed: none noted -PHQ9:  Flowsheet Row Clinical Support from 01/10/2021 in [place]  PHQ-9 Total Score 11     -GAD7:  GAD 7 : Generalized  Anxiety Score 01/10/2021  Nervous, Anxious, on Edge 2  Control/stop worrying 1  Worry too much - different things 2  Trouble relaxing 1  Restless 1  Easily annoyed or irritable 1  Afraid - awful might happen 0  Total GAD 7 Score 8  Anxiety Difficulty Very difficult  -She reports her mood has been controlled and seems to be leveling off -Recommended to continue current medication   GERD (Goal: Control symptoms) -Controlled -Current treatment  Omeprazole 40mg  daily -Medications previously tried: none noted -Denies any symptoms, working on trigger foods -Reports accurate medication administration 30 minutes before food or drink on empty stomach  -Recommended to continue current medication  Patient Goals/Self-Care Activities Patient will:  - take medications as prescribed focus on medication adherence by fill dates check blood pressure a few times per week, document, and provide at future appointments  Follow Up Plan: The care management team will reach out to the patient again over the next 120 days.      Abigail Wagner was given information about Chronic Care Management services today including:  CCM service includes personalized support from designated clinical staff supervised by her physician, including individualized plan of care and coordination with other care providers 24/7 contact phone numbers for assistance for urgent and routine care needs. Standard insurance, coinsurance, copays and deductibles apply for chronic care management only during months in which we provide at least 20 minutes of these services. Most insurances cover these services at 100%, however patients may be responsible for any copay, coinsurance and/or deductible if applicable. This service may help you avoid the need for more expensive face-to-face services. Only one practitioner may furnish and bill the service in a calendar month. The patient may stop CCM services at any time (effective at the end of the  month) by phone call to the office staff.  Patient agreed to services and verbal consent obtained.   The patient verbalized understanding of instructions, educational materials, and care plan provided today and agreed to receive a mailed copy of patient instructions, educational materials, and care plan.  Telephone follow up appointment with pharmacy team member scheduled for: 4 months  Abigail Wagner, [place]

## 2021-03-09 ENCOUNTER — Telehealth: Payer: Self-pay | Admitting: *Deleted

## 2021-03-09 ENCOUNTER — Ambulatory Visit (HOSPITAL_COMMUNITY)
Admission: RE | Admit: 2021-03-09 | Discharge: 2021-03-09 | Disposition: A | Discharge status: Home / Self Care | Payer: Medicare Other | Source: Ambulatory Visit | Attending: Family Medicine | Admitting: Family Medicine

## 2021-03-09 ENCOUNTER — Other Ambulatory Visit: Payer: Self-pay

## 2021-03-09 DIAGNOSIS — M549 Dorsalgia, unspecified: Secondary | ICD-10-CM | POA: Insufficient documentation

## 2021-03-09 DIAGNOSIS — I700 Atherosclerosis of aorta: Secondary | ICD-10-CM | POA: Diagnosis not present

## 2021-03-09 DIAGNOSIS — G8929 Other chronic pain: Secondary | ICD-10-CM | POA: Diagnosis not present

## 2021-03-09 DIAGNOSIS — M545 Low back pain, unspecified: Secondary | ICD-10-CM | POA: Diagnosis not present

## 2021-03-09 DIAGNOSIS — M419 Scoliosis, unspecified: Secondary | ICD-10-CM | POA: Diagnosis not present

## 2021-03-09 DIAGNOSIS — M546 Pain in thoracic spine: Secondary | ICD-10-CM | POA: Diagnosis not present

## 2021-03-09 NOTE — Telephone Encounter (Signed)
Received call from patient.   Reports that PCP requested that she verify Depakote dose.   States that she is currently taking Depakote ER 500mg  PO QD.   PCP to be made aware.

## 2021-03-11 NOTE — Telephone Encounter (Signed)
Call placed to patient and patient made aware.  

## 2021-03-15 ENCOUNTER — Other Ambulatory Visit: Payer: Self-pay

## 2021-03-15 MED ORDER — HYDROCODONE-ACETAMINOPHEN 5-325 MG PO TABS: 1.0000 | ORAL_TABLET | Freq: Four times a day (QID) | ORAL | 0 refills | Status: DC | PRN

## 2021-03-23 ENCOUNTER — Telehealth: Payer: Self-pay | Admitting: Pharmacist

## 2021-03-23 NOTE — Progress Notes (Addendum)
    Chronic Care Management Pharmacy Assistant   Name: Abigail Wagner  MRN: [id number] DOB: 10/20/1950  Reason for Encounter: Adherence Review  Reviewed the patients chart for any medical/health and/or medication changes there were not any at this time.  Office Visit: 03/07/21 Dr. Dennard Schaumann For follow-up. No medication changes.  Follow-Up:Pharmacist Review  [name], [place] Pharmacist Assistant [phone]

## 2021-03-29 ENCOUNTER — Other Ambulatory Visit: Payer: Self-pay | Admitting: *Deleted

## 2021-03-29 MED ORDER — HYDROCODONE-ACETAMINOPHEN 5-325 MG PO TABS
1.0000 | ORAL_TABLET | Freq: Four times a day (QID) | ORAL | 0 refills | Status: DC | PRN
Start: 2021-03-29 — End: 2021-04-19

## 2021-03-29 NOTE — Telephone Encounter (Signed)
Received call from patient.   Requested refill on Hydrocodone/ APAP.  Patient states that she is given #30 tabs, and this does not last her through the month.   Please advise.   Last office visit 03/07/2021.  Last refill 03/15/2021.

## 2021-04-01 ENCOUNTER — Ambulatory Visit: Payer: Medicare Other | Admitting: Family Medicine

## 2021-04-07 ENCOUNTER — Telehealth (HOSPITAL_COMMUNITY): Payer: Self-pay | Admitting: Psychiatry

## 2021-04-07 NOTE — Telephone Encounter (Signed)
Patients husband calling in [place] patient is very depressed medications are not working and advised she needs to talk with someone. Pt was scheduled with Ms. Peggy 7/18 at 10am. Did advise to take pt to ED or [facility] if needed Husband advised she just needs someone to talk with. Ms. Vickii Chafe suggested that someone look at her medications.

## 2021-04-11 ENCOUNTER — Ambulatory Visit (INDEPENDENT_AMBULATORY_CARE_PROVIDER_SITE_OTHER): Payer: Medicare Other | Admitting: Psychiatry

## 2021-04-11 ENCOUNTER — Encounter (HOSPITAL_COMMUNITY): Payer: Self-pay | Admitting: Psychiatry

## 2021-04-11 ENCOUNTER — Other Ambulatory Visit: Payer: Self-pay

## 2021-04-11 DIAGNOSIS — F331 Major depressive disorder, recurrent, moderate: Secondary | ICD-10-CM | POA: Diagnosis not present
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
## 2021-04-12 ENCOUNTER — Encounter: Payer: Self-pay | Admitting: Family Medicine

## 2021-04-12 ENCOUNTER — Other Ambulatory Visit: Payer: Self-pay

## 2021-04-12 ENCOUNTER — Ambulatory Visit (INDEPENDENT_AMBULATORY_CARE_PROVIDER_SITE_OTHER): Payer: Medicare Other | Admitting: Family Medicine

## 2021-04-12 VITALS — BP 122/66 | HR 68 | Temp 98.9°F | Resp 18 | Ht 65.0 in | Wt 116.4 lb

## 2021-04-12 DIAGNOSIS — R634 Abnormal weight loss: Secondary | ICD-10-CM

## 2021-04-12 DIAGNOSIS — Z85048 Personal history of other malignant neoplasm of rectum, rectosigmoid junction, and anus: Secondary | ICD-10-CM

## 2021-04-12 DIAGNOSIS — E441 Mild protein-calorie malnutrition: Secondary | ICD-10-CM

## 2021-04-12 DIAGNOSIS — R531 Weakness: Secondary | ICD-10-CM

## 2021-04-12 DIAGNOSIS — R5382 Chronic fatigue, unspecified: Secondary | ICD-10-CM

## 2021-04-12 LAB — CBC WITH DIFFERENTIAL/PLATELET
Absolute Monocytes: 347 cells/uL (ref 200–950)
Basophils Absolute: 31 cells/uL (ref 0–200)
Basophils Relative: 0.9 %
Eosinophils Absolute: 88 cells/uL (ref 15–500)
Eosinophils Relative: 2.6 %
HCT: 32.9 % — ABNORMAL LOW (ref 35.0–45.0)
Hemoglobin: 10.9 g/dL — ABNORMAL LOW (ref 11.7–15.5)
Lymphs Abs: 1353 cells/uL (ref 850–3900)
MCH: 31.9 pg (ref 27.0–33.0)
MCHC: 33.1 g/dL (ref 32.0–36.0)
MCV: 96.2 fL (ref 80.0–100.0)
MPV: 10.7 fL (ref 7.5–12.5)
Monocytes Relative: 10.2 %
Neutro Abs: 1581 cells/uL (ref 1500–7800)
Neutrophils Relative %: 46.5 %
Platelets: 187 10*3/uL (ref 140–400)
RBC: 3.42 10*6/uL — ABNORMAL LOW (ref 3.80–5.10)
RDW: 14.5 % (ref 11.0–15.0)
Total Lymphocyte: 39.8 %
WBC: 3.4 10*3/uL — ABNORMAL LOW (ref 3.8–10.8)

## 2021-04-12 NOTE — Progress Notes (Signed)
Subjective:    Patient ID: Abigail Wagner, female    DOB: 04/24/1951, 70 y.o.   MRN: [id number]  HPI  03/07/21 I have not seen the patient since October of last year.  Today she appears quite emaciated compared to the last time that I saw her.  However comparing her most recent weight, she is actually improved!  Last year she was over 140 pounds.  Due to recurrent abdominal pain and diverticulitis, her weight dropped to a staggering 111 pounds by March.  It is gradually improved up to 122 pounds today.  However she still looks very frail and weak and malnourished.  She is here today with her husband.  They both state that she is having "shaking spells".  Both arms will begin to shake like a tremor.  She will feel extremely weak.  This can last minutes and then gradually improve.  It also can involve her legs.  It does not sound like a seizure.  She does not lose consciousness.  She does not have any postictal phase.  She denies any bowel or bladder incontinence.  She describes it is more of a "Parkinson tremor".  She will notice gentle shaking in her extremities.  She also reports dizziness and lightheadedness and weakness.  Her blood pressure will drop at home as low as 90 systolic.  She also complains of shooting back pain.  The pain begins in the mid thoracic spine and radiates down to her tailbone on either side of the spine.  She states that the pain is sharp and intense.  She denies any bowel or bladder incontinence.  She denies any saddle anesthesias or numbness or tingling in her extremities.  At that time, my plan was:  Patient has been avoiding certain foods and has been eating sparingly due to the abdominal pain now since September.  I believe that she has developed protein calorie malnutrition and has generalized weakness secondary to this likely causing a tremor.  Given her history of anal carcinoma as well as ovarian carcinoma and the new back pain, I have recommended thoracic spine x-rays and  lumbar spine x-rays to rule out any evidence of bone metastasis or vertebral fractures due to malnourishment.  I will also check a vitamin B12 level, and iron level, CBC, TSH, and a CMP.  I believe that her blood pressure is too low likely causing dizziness and weakness so I recommended stopping losartan.  Await the results of her x-rays and the lab work.  Encouraged her to eat a protein supplement such as Ensure and to increase her protein intake including chicken and fish and [place] as well as beans and dairy products such as cheese and yogurt.   No visits with results within 1 Month(s) from this visit.  Latest known visit with results is:  Office Visit on 03/07/2021  Component Date Value Ref Range Status   Vitamin B-12 03/07/2021 388  200 - 1,100 pg/mL Final   Comment: . Please Note: Although the reference range for vitamin B12 is [phone] pg/mL, it has been reported that between 5 and 10% of patients with values between 200 and 400 pg/mL may experience neuropsychiatric and hematologic abnormalities due to occult B12 deficiency; less than 1% of patients with values above 400 pg/mL will have symptoms. .    Iron 03/07/2021 72  45 - 160 mcg/dL Final   WBC 03/07/2021 3.5 (A) 3.8 - 10.8 Thousand/uL Final   RBC 03/07/2021 3.21 (A) 3.80 - 5.10 Million/uL Final  Hemoglobin 03/07/2021 10.4 (A) 11.7 - 15.5 g/dL Final   HCT 03/07/2021 31.1 (A) 35.0 - 45.0 % Final   MCV 03/07/2021 96.9  80.0 - 100.0 fL Final   MCH 03/07/2021 32.4  27.0 - 33.0 pg Final   MCHC 03/07/2021 33.4  32.0 - 36.0 g/dL Final   RDW 03/07/2021 14.5  11.0 - 15.0 % Final   Platelets 03/07/2021 167  140 - 400 Thousand/uL Final   MPV 03/07/2021 10.6  7.5 - 12.5 fL Final   Neutro Abs 03/07/2021 1,575  1,500 - 7,800 cells/uL Final   Lymphs Abs 03/07/2021 1,365  850 - 3,900 cells/uL Final   Absolute Monocytes 03/07/2021 420  200 - 950 cells/uL Final   Eosinophils Absolute 03/07/2021 102  15 - 500 cells/uL Final   Basophils  Absolute 03/07/2021 39  0 - 200 cells/uL Final   Neutrophils Relative % 03/07/2021 45  % Final   Total Lymphocyte 03/07/2021 39.0  % Final   Monocytes Relative 03/07/2021 12.0  % Final   Eosinophils Relative 03/07/2021 2.9  % Final   Basophils Relative 03/07/2021 1.1  % Final   Glucose, Bld 03/07/2021 58 (A) 65 - 99 mg/dL Final   Comment: .            Fasting reference interval .    BUN 03/07/2021 22  7 - 25 mg/dL Final   Creat 03/07/2021 0.77  0.60 - 0.93 mg/dL Final   Comment: For patients >63 years of age, the reference limit for Creatinine is approximately 13% higher for people identified as African-American. .    GFR, Est Non African American 03/07/2021 78  > OR = 60 mL/min/1.2m2 Final   GFR, Est African American 03/07/2021 91  > OR = 60 mL/min/1.53m2 Final   BUN/Creatinine Ratio 44/31/5400 NOT APPLICABLE  6 - 22 (calc) Final   Sodium 03/07/2021 144  135 - 146 mmol/L Final   Potassium 03/07/2021 4.9  3.5 - 5.3 mmol/L Final   Chloride 03/07/2021 109  98 - 110 mmol/L Final   CO2 03/07/2021 27  20 - 32 mmol/L Final   Calcium 03/07/2021 9.0  8.6 - 10.4 mg/dL Final   Total Protein 03/07/2021 6.1  6.1 - 8.1 g/dL Final   Albumin 03/07/2021 3.9  3.6 - 5.1 g/dL Final   Globulin 03/07/2021 2.2  1.9 - 3.7 g/dL (calc) Final   AG Ratio 03/07/2021 1.8  1.0 - 2.5 (calc) Final   Total Bilirubin 03/07/2021 0.2  0.2 - 1.2 mg/dL Final   Alkaline phosphatase (APISO) 03/07/2021 44  37 - 153 U/L Final   AST 03/07/2021 35  10 - 35 U/L Final   ALT 03/07/2021 23  6 - 29 U/L Final   TSH 03/07/2021 1.82  0.40 - 4.50 mIU/L Final   04/12/21 Patient states that she feels better after stopping her blood pressure medication.  Her blood pressure remains well controlled.  Unfortunately she continues to lose weight and has lost 6 additional pounds.  She also reports feeling unsteady on her feet.  She feels extremely weak in her legs and has a difficult time keeping her balance.  She is trying to drink boost  twice daily.  She feels that this does help her energy level however she continues to lose weight.  She is taking alprazolam 3 times a day and I have asked the patient and her husband to reduce the dose to 50% to see if this is affecting her balance as well.  She denies any  headache or seizure activity or neck stiffness or strokelike symptoms.  She denies any chest pain shortness of breath or dyspnea on exertion.  She had a normal colonoscopy and EGD earlier this year in January. Past Medical History:  Diagnosis Date   Allergy    Anal carcinoma [facility]) oncologist-  dr Whitney Muse ([facility])   dx 12/ 2016 [facility] --  chemo and radiation- Dr. Marcello Moores   Anemia    due to her cancer   Anxiety    Dr. Harrington Challenger at [place] (psychiatrist).     Carcinoma of ovary, stage 3 [facility]) oncologist-  dr gehrig/  dr Doreene Eland (cancer center in [place] w/ [facility])--  no recurrency   dx 09/ 2015  Stage IIIB  papillary ovarian carcinoma  s/p  omentectomy and BSO &  chemotherapy (completed 12-07-2014)   Chemotherapy induced nausea and vomiting 10/23/2014   Chronic kidney disease    stones   DDD (degenerative disc disease), lumbosacral    GERD (gastroesophageal reflux disease)    Headache(784.0)    History of acute respiratory failure    05-30-2009  drug overdose-- (intubated for 2 days)  and aspiration pneumonia   History of adenomatous polyp of colon 10/07/2015   tubular adenoma high grade dysplasia   History of herpes genitalis    History of kidney stones    History of ovarian cancer 08/03/2015   History of suicide attempt    per documentation in epic  05-30-2009  overdose benaodiazepine   HTN (hypertension)    takes Metoprolol daily   Hyperlipidemia    Hypertension 09/27/2016   IBS (irritable bowel syndrome)    takes Bentyl daily   Internal carotid artery stenosis, left    <50%, ulcerative plaque at carotid bulb.    Major depression    Mood disorder (HCC)    OA (osteoarthritis)    both hips   Ovarian cancer  ([place])    2015-TAH/BSO   Prolapse of vaginal vault after hysterectomy 07/20/2015   Rectovaginal fistula 08/05/2015   Sigmoid diverticulosis    Smokers' cough [facility])     Past Surgical History:  Procedure Laterality Date   BIOPSY  10/12/2020   Procedure: BIOPSY;  Surgeon: Harvel Quale, MD;  [facility];  Service: Gastroenterology;;   [place]   COLONOSCOPY N/A 10/07/2015   Procedure: COLONOSCOPY;  Surgeon: Rogene Houston, MD;  [facility];  Service: Endoscopy;  Laterality: N/A;  7:30   COLONOSCOPY WITH PROPOFOL N/A 10/12/2020   Procedure: COLONOSCOPY WITH PROPOFOL;  Surgeon: Harvel Quale, MD;  [facility];  Service: Gastroenterology;  Laterality: N/A;  9:00   ESOPHAGOGASTRODUODENOSCOPY (EGD) WITH PROPOFOL N/A 10/12/2020   Procedure: ESOPHAGOGASTRODUODENOSCOPY (EGD) WITH PROPOFOL;  Surgeon: Harvel Quale, MD;  [facility];  Service: Gastroenterology;  Laterality: N/A;   EXPLORATORY LAPAROTOMY/ OMENTECTOMY/  BILATERAL SALPINGOOPHORECTOMY/  PORT-A-CATH PLACEMENT  07-03-2014   [place]   KNEE ARTHROSCOPY Left 09-22-2004   LAPAROSCOPY N/A 06/02/2014   Procedure: DIAGNOSTIC LAPAROSCOPY, OMENTAL BIOPSY, RIGHT OVARY BIOPSY, LYSIS OF ADHESIONS;  Surgeon: Fanny Skates, MD;  Location: [place];  Service: General;  Laterality: N/A;   MUCOSAL ADVANCEMENT FLAP N/A 06/15/2016   Procedure: EXCISION RECTOVAGINAOL FISTULA WITH MUCOSAL ADVANCEMENT FLAP;  Surgeon: Leighton Ruff, MD;  Location: [place];  Service: General;  Laterality: N/A;   [place] N/A 09/09/2015   Procedure: PLACEMENT OF SETON;  Surgeon: Elmo Putt  Marcello Moores, MD;  Location: [facility];  Service: General;  Laterality: N/A;   PORT-A-CATH REMOVAL Right 08/17/2014   Procedure: REMOVAL INTRAPERITONEAL CHEMO PORT;  Surgeon: Fanny Skates, MD;  Location: [place];   Service: General;  Laterality: Right;   PORTACATH PLACEMENT Right 08/17/2014   Procedure:  PLACE NEW PORT A CATH;  Surgeon: Fanny Skates, MD;  Location: [place];  Service: General;  Laterality: Right;   RECTAL BIOPSY N/A 09/09/2015   Procedure: BIOPSY OF RECTOVAGINAL MASS;  Surgeon: Leighton Ruff, MD;  Location: [place];  Service: General;  Laterality: N/A;   TUBAL LIGATION  YRS AGO   VAGINAL HYSTERECTOMY  1981   fibroids   Current Outpatient Medications on File Prior to Visit  Medication Sig Dispense Refill   b complex vitamins tablet Take 1 tablet by mouth daily.     Cholecalciferol (VITAMIN D-3) 125 MCG (5000 UT) TABS Take 5,000 Units by mouth daily.     Coenzyme Q10 (CO Q 10) 100 MG CAPS Take 100 mg by mouth daily.     Collagen-Vitamin C-Biotin (COLLAGEN 1500/C PO) Take by mouth.     divalproex (DEPAKOTE ER) 500 MG 24 hr tablet Take 1 tablet (500 mg total) by mouth daily. 90 tablet 3   docusate sodium (COLACE) 100 MG capsule Take 1 capsule (100 mg total) by mouth daily as needed for moderate constipation. 30 capsule 2   escitalopram (LEXAPRO) 20 MG tablet Take 1 tablet (20 mg total) by mouth 2 (two) times daily. 180 tablet 2   HORSE CHESTNUT ER PO Take by mouth.     HYDROcodone-acetaminophen (NORCO) 5-325 MG tablet Take 1 tablet by mouth every 6 (six) hours as needed for moderate pain. 30 tablet 0   KRILL OIL PO Take 350 mg by mouth daily.     LORazepam (ATIVAN) 1 MG tablet Take 1 tablet (1 mg total) by mouth 3 (three) times daily as needed for anxiety. 90 tablet 5   losartan (COZAAR) 50 MG tablet TAKE 1 TABLET BY MOUTH EVERY DAY 90 tablet 3   mirtazapine (REMERON) 15 MG tablet TAKE 1 TABLET BY MOUTH EVERYDAY AT BEDTIME 90 tablet 1   Multiple Vitamin (MULTIVITAMIN) tablet Take 1 tablet by mouth daily.     Multiple Vitamins-Minerals (HAIR SKIN & NAILS ADVANCED PO) Take by mouth.     omeprazole (PRILOSEC) 40 MG capsule Take 1 capsule (40 mg total) by  mouth daily. 90 capsule 3   ondansetron (ZOFRAN) 4 MG tablet Take 1 tablet (4 mg total) by mouth every 8 (eight) hours as needed for nausea or vomiting. 30 tablet 1   OVER THE COUNTER MEDICATION Take 1 tablet by mouth in the morning and at bedtime. Patient reports that she is taking a ViViscal Tablet by mouth twice daily for hair loss.     polyethylene glycol (MIRALAX / GLYCOLAX) 17 g packet Take 17 g by mouth daily. (Patient taking differently: Take 17 g by mouth daily as needed for moderate constipation.) 30 each 1   rosuvastatin (CRESTOR) 20 MG tablet Take 1 tablet (20 mg total) by mouth daily. 90 tablet 3   vitamin B-12 (CYANOCOBALAMIN) 1000 MCG tablet Take 1,000 mcg by mouth daily.     No current facility-administered medications on file prior to visit.   Allergies  Allergen Reactions   Morphine And Related     Delirium with sbo   Social History   Socioeconomic History   Marital status: Married  Spouse name: Coralyn Mark   Number of children: 3   Years of education: 12   Highest education level: Not on file  Occupational History   Occupation: retire    Comment: [employer]  Tobacco Use   Smoking status: Every Day    Packs/day: 0.50    Years: 43.00    Pack years: 21.50    Types: Cigarettes   Smokeless tobacco: Never  Vaping Use   Vaping Use: Never used  Substance and Sexual Activity   Alcohol use: No   Drug use: No   Sexual activity: Not Currently    Birth control/protection: Surgical    Comment: hyst  Other Topics Concern   Not on file  Social History Narrative   Lives at home with Coralyn Mark   Retired [employer]   Social Determinants of Health   Financial Resource Strain: Low Risk    Difficulty of Paying Living Expenses: Not very hard  Food Insecurity: Not on file  Transportation Needs: Not on file  Physical Activity: Not on file  Stress: Not on file  Social Connections: Not on file  Intimate Partner Violence: Not on file   Family History  Problem Relation Age of  Onset   Bipolar disorder Mother    Anxiety disorder Mother    Dementia Mother    Depression Mother    Mental illness Mother    Vision loss Mother    Alzheimer's disease Mother    Alcohol abuse Paternal Uncle    Bipolar disorder Maternal Grandmother    Dementia Maternal Grandmother    Alzheimer's disease Maternal Grandmother    Alcohol abuse Paternal Uncle    Stroke Brother    Deep vein thrombosis Son    Heart disease Father    Hyperlipidemia Father    Hypertension Father    Stroke Father    Vision loss Father    Atrial fibrillation Sister    Heart disease Brother    Heart disease Brother    ADD / ADHD Neg Hx    Drug abuse Neg Hx    OCD Neg Hx    Paranoid behavior Neg Hx    Schizophrenia Neg Hx    Seizures Neg Hx    Sexual abuse Neg Hx    Physical abuse Neg Hx       Review of Systems     Objective:   Physical Exam Vitals reviewed.  Constitutional:      General: She is not in acute distress.    Appearance: She is not diaphoretic.  Cardiovascular:     Rate and Rhythm: Normal rate and regular rhythm.     Heart sounds: Normal heart sounds. No murmur heard.   No friction rub. No gallop.  Pulmonary:     Effort: Pulmonary effort is normal. No respiratory distress.     Breath sounds: Normal breath sounds. No stridor. No wheezing or rales.  Abdominal:     General: Abdomen is flat. Bowel sounds are normal. There is no distension.     Palpations: Abdomen is soft. There is no mass.     Tenderness: There is no abdominal tenderness.  Musculoskeletal:     Right upper leg: No deformity.     Left upper leg: No deformity.     Right lower leg: Normal.     Left lower leg: Normal.  Neurological:     Mental Status: She is alert.          Assessment & Plan:  Weakness - Plan: CBC  with Differential/Platelet, CT Abdomen Pelvis W Contrast, Ambulatory referral to Physical Therapy  Chronic fatigue - Plan: CBC with Differential/Platelet  Mild protein-calorie malnutrition  ([place]) - Plan: CBC with Differential/Platelet  Weight loss, unintentional - Plan: CT Abdomen Pelvis W Contrast  History of anal cancer - Plan: CT Abdomen Pelvis W Contrast I believe her weakness is due to deconditioning.  Therefore I recommended physical therapy to try to improve the strength in her legs and improve her balance.  Also believe her disequilibrium and balance could be affected by her Ativan.  I recommended that she cut the dose in half and gradually try to wean away from that medication is much as possible.  Given her history of anal cancer, I recommended a CT of the abdomen and pelvis given her unintentional weight loss to rule out any malignancy although her colonoscopy and EGD were normal earlier this year.  Continue to use boost as a protein supplement given her protein calorie malnutrition.  Given her recent anemia, recheck CBC to see if this is improving on the B12 supplement.

## 2021-04-13 ENCOUNTER — Telehealth: Payer: Self-pay | Admitting: Pharmacist

## 2021-04-13 ENCOUNTER — Other Ambulatory Visit: Payer: Self-pay | Admitting: *Deleted

## 2021-04-13 DIAGNOSIS — D649 Anemia, unspecified: Secondary | ICD-10-CM

## 2021-04-13 NOTE — Progress Notes (Addendum)
Chronic Care Management Pharmacy Assistant   Name: Abigail Wagner  MRN: [id number] DOB: 06/21/1951   Reason for Encounter: Disease State For HTN.   Conditions to be addressed/monitored: HTN, Migraines, GERD, Depression/Bipolar, HLD  Recent office visits:  04/12/21 Dr. Dennard Schaumann For follow-up. No medication changes.   Recent consult visits:  04/11/21 Behavioral Health Bynum, Peggy E, [place]. No information available.  04/07/21 Behavioral Health [name], MD. No information available.   Hospital visits:  None since 03/23/21  Medications: Outpatient Encounter Medications as of 04/13/2021  Medication Sig Note   b complex vitamins tablet Take 1 tablet by mouth daily.    Cholecalciferol (VITAMIN D-3) 125 MCG (5000 UT) TABS Take 5,000 Units by mouth daily.    Coenzyme Q10 (CO Q 10) 100 MG CAPS Take 100 mg by mouth daily.    Collagen-Vitamin C-Biotin (COLLAGEN 1500/C PO) Take by mouth.    divalproex (DEPAKOTE ER) 500 MG 24 hr tablet Take 1 tablet (500 mg total) by mouth daily.    docusate sodium (COLACE) 100 MG capsule Take 1 capsule (100 mg total) by mouth daily as needed for moderate constipation.    escitalopram (LEXAPRO) 20 MG tablet Take 1 tablet (20 mg total) by mouth 2 (two) times daily.    HORSE CHESTNUT ER PO Take by mouth.    HYDROcodone-acetaminophen (NORCO) 5-325 MG tablet Take 1 tablet by mouth every 6 (six) hours as needed for moderate pain.    KRILL OIL PO Take 350 mg by mouth daily.    LORazepam (ATIVAN) 1 MG tablet Take 1 tablet (1 mg total) by mouth 3 (three) times daily as needed for anxiety.    losartan (COZAAR) 50 MG tablet TAKE 1 TABLET BY MOUTH EVERY DAY    mirtazapine (REMERON) 15 MG tablet TAKE 1 TABLET BY MOUTH EVERYDAY AT BEDTIME    Multiple Vitamin (MULTIVITAMIN) tablet Take 1 tablet by mouth daily.    Multiple Vitamins-Minerals (HAIR SKIN & NAILS ADVANCED PO) Take by mouth.    omeprazole (PRILOSEC) 40 MG capsule Take 1 capsule (40 mg total) by mouth daily.     ondansetron (ZOFRAN) 4 MG tablet Take 1 tablet (4 mg total) by mouth every 8 (eight) hours as needed for nausea or vomiting.    OVER THE COUNTER MEDICATION Take 1 tablet by mouth in the morning and at bedtime. Patient reports that she is taking a ViViscal Tablet by mouth twice daily for hair loss.    polyethylene glycol (MIRALAX / GLYCOLAX) 17 g packet Take 17 g by mouth daily. (Patient taking differently: Take 17 g by mouth daily as needed for moderate constipation.) 10/06/2020: Patient and Husband report that she takes as needed.   rosuvastatin (CRESTOR) 20 MG tablet Take 1 tablet (20 mg total) by mouth daily.    vitamin B-12 (CYANOCOBALAMIN) 1000 MCG tablet Take 1,000 mcg by mouth daily.    No facility-administered encounter medications on file as of 04/13/2021.   Reviewed chart prior to disease state call. Spoke with patient regarding BP  Recent Office Vitals: BP Readings from Last 3 Encounters:  04/12/21 122/66  03/07/21 116/76  01/10/21 114/80   Pulse Readings from Last 3 Encounters:  04/12/21 68  03/07/21 66  12/21/20 81    Wt Readings from Last 3 Encounters:  04/12/21 116 lb 6.4 oz (52.8 kg)  03/07/21 122 lb (55.3 kg)  01/10/21 119 lb 14.4 oz (54.4 kg)     Kidney Function Lab Results  Component Value Date/Time  CREATININE 0.77 03/07/2021 03:57 PM   CREATININE 0.89 01/10/2021 10:17 AM   GFRNONAA 78 03/07/2021 03:57 PM   GFRAA 91 03/07/2021 03:57 PM    BMP Latest Ref Rng & Units 03/07/2021 01/10/2021 12/14/2020  Glucose 65 - 99 mg/dL 58(L) 76 99  BUN 7 - 25 mg/dL 22 22 19   Creatinine 0.60 - 0.93 mg/dL 0.77 0.89 0.56  BUN/Creat Ratio 6 - 22 (calc) NOT APPLICABLE NOT APPLICABLE -  Sodium 650 - 146 mmol/L 144 142 137  Potassium 3.5 - 5.3 mmol/L 4.9 4.7 4.1  Chloride 98 - 110 mmol/L 109 105 101  CO2 20 - 32 mmol/L 27 27 26   Calcium 8.6 - 10.4 mg/dL 9.0 9.4 8.8(L)    Current antihypertensive regimen:  Losartan 50mg  daily  How often are you checking your Blood  Pressure? Patient stated daily  Current home BP readings: Patient stated her blood pressure readings usually range around the 180s over 80's   What recent interventions/DTPs have been made by any provider to improve Blood Pressure control since last CPP Visit: None.  Any recent hospitalizations or ED visits since last visit with CPP? Patient stated no.  What diet changes have been made to improve Blood Pressure Control?  Patient stated she doesn't have a good appetite. She doesn't each much because of her diverticulitis.   What exercise is being done to improve your Blood Pressure Control?  Patient stated she is not able to do any exercising at this time.   Adherence Review: Is the patient currently on ACE/ARB medication? Losartan 50 mg   Does the patient have >5 day gap between last estimated fill dates? Per misc rpts, no.  Star Rating Drugs: Rosuvastatin 20 mg 90 DS 02/05/21, Losartan 50 mg 90 DS 01/16/21.  Patient was wondering if there was any medication that could increase her appetite.  Follow-Up:Pharmacist Review  [name], RMA Clinical Pharmacist Assistant [phone]  10 minutes spent in review, coordination, and documentation.  Reviewed by: [name], PharmD Clinical Pharmacist [phone]

## 2021-04-15 ENCOUNTER — Other Ambulatory Visit: Payer: Medicare Other

## 2021-04-15 ENCOUNTER — Other Ambulatory Visit: Payer: Self-pay

## 2021-04-15 DIAGNOSIS — D649 Anemia, unspecified: Secondary | ICD-10-CM

## 2021-04-19 ENCOUNTER — Encounter: Payer: Self-pay | Admitting: Family Medicine

## 2021-04-19 ENCOUNTER — Other Ambulatory Visit: Payer: Self-pay | Admitting: Family Medicine

## 2021-04-19 LAB — FECAL GLOBIN BY IMMUNOCHEMISTRY
FECAL GLOBIN RESULT:: NOT DETECTED
MICRO NUMBER:: 12152279
SPECIMEN QUALITY:: ADEQUATE

## 2021-04-19 MED ORDER — HYDROCODONE-ACETAMINOPHEN 5-325 MG PO TABS: 1.0000 | ORAL_TABLET | Freq: Four times a day (QID) | ORAL | 0 refills | Status: DC | PRN

## 2021-04-22 ENCOUNTER — Other Ambulatory Visit (HOSPITAL_COMMUNITY): Payer: Medicare Other

## 2021-04-25 ENCOUNTER — Encounter (HOSPITAL_COMMUNITY): Payer: Self-pay

## 2021-04-25 ENCOUNTER — Other Ambulatory Visit (INDEPENDENT_AMBULATORY_CARE_PROVIDER_SITE_OTHER): Payer: Self-pay | Admitting: Family Medicine

## 2021-04-25 ENCOUNTER — Other Ambulatory Visit: Payer: Self-pay

## 2021-04-25 ENCOUNTER — Ambulatory Visit (INDEPENDENT_AMBULATORY_CARE_PROVIDER_SITE_OTHER): Payer: Medicare Other | Admitting: Psychiatry

## 2021-04-25 ENCOUNTER — Inpatient Hospital Stay (HOSPITAL_COMMUNITY): Payer: Medicare Other | Attending: Hematology

## 2021-04-25 DIAGNOSIS — D649 Anemia, unspecified: Secondary | ICD-10-CM | POA: Diagnosis not present

## 2021-04-25 DIAGNOSIS — F331 Major depressive disorder, recurrent, moderate: Secondary | ICD-10-CM

## 2021-04-25 DIAGNOSIS — C210 Malignant neoplasm of anus, unspecified: Secondary | ICD-10-CM | POA: Diagnosis not present

## 2021-04-25 DIAGNOSIS — C569 Malignant neoplasm of unspecified ovary: Secondary | ICD-10-CM | POA: Insufficient documentation

## 2021-04-25 DIAGNOSIS — R112 Nausea with vomiting, unspecified: Secondary | ICD-10-CM

## 2021-04-25 LAB — CBC WITH DIFFERENTIAL/PLATELET
Abs Immature Granulocytes: 0.04 10*3/uL (ref 0.00–0.07)
Basophils Absolute: 0 10*3/uL (ref 0.0–0.1)
Basophils Relative: 1 %
Eosinophils Absolute: 0.1 10*3/uL (ref 0.0–0.5)
Eosinophils Relative: 3 %
HCT: 33.1 % — ABNORMAL LOW (ref 36.0–46.0)
Hemoglobin: 10.6 g/dL — ABNORMAL LOW (ref 12.0–15.0)
Immature Granulocytes: 1 %
Lymphocytes Relative: 40 %
Lymphs Abs: 1.3 10*3/uL (ref 0.7–4.0)
MCH: 31.7 pg (ref 26.0–34.0)
MCHC: 32 g/dL (ref 30.0–36.0)
MCV: 99.1 fL (ref 80.0–100.0)
Monocytes Absolute: 0.4 10*3/uL (ref 0.1–1.0)
Monocytes Relative: 11 %
Neutro Abs: 1.4 10*3/uL — ABNORMAL LOW (ref 1.7–7.7)
Neutrophils Relative %: 44 %
Platelets: 183 10*3/uL (ref 150–400)
RBC: 3.34 MIL/uL — ABNORMAL LOW (ref 3.87–5.11)
RDW: 16.2 % — ABNORMAL HIGH (ref 11.5–15.5)
WBC: 3.2 10*3/uL — ABNORMAL LOW (ref 4.0–10.5)
nRBC: 0 % (ref 0.0–0.2)

## 2021-04-25 LAB — IRON AND TIBC
Iron: 72 ug/dL (ref 28–170)
Saturation Ratios: 19 % (ref 10.4–31.8)
TIBC: 373 ug/dL (ref 250–450)
UIBC: 301 ug/dL

## 2021-04-25 LAB — FERRITIN: Ferritin: 36 ng/mL (ref 11–307)

## 2021-04-25 MED ORDER — HEPARIN SOD (PORK) LOCK FLUSH 100 UNIT/ML IV SOLN
500.0000 [IU] | Freq: Once | INTRAVENOUS | Status: AC
  Administered 2021-04-25: 500 [IU] via INTRAVENOUS

## 2021-04-25 MED ORDER — SODIUM CHLORIDE 0.9% FLUSH
10.0000 mL | Freq: Once | INTRAVENOUS | Status: AC
  Administered 2021-04-25: 10 mL

## 2021-04-25 NOTE — Progress Notes (Signed)
Patient presents today for labs with a port flush. Vital signs stable. Patient has no complaints today. MAR reviewed.

## 2021-04-25 NOTE — Patient Instructions (Signed)
Abigail Wagner  Discharge Instructions: Thank you for choosing [facility] to provide your oncology and hematology care.  If you have a lab appointment with the [facility], please come in thru the Main Entrance and check in at the main information desk.  Wear comfortable clothing and clothing appropriate for easy access to any Portacath or PICC line.   We strive to give you quality time with your provider. You may need to reschedule your appointment if you arrive late (15 or more minutes).  Arriving late affects you and other patients whose appointments are after yours.  Also, if you miss three or more appointments without notifying the office, you may be dismissed from the clinic at the provider's discretion.      For prescription refill requests, have your pharmacy contact our office and allow 72 hours for refills to be completed.    Today you received port flush/ lab draw.        BELOW ARE SYMPTOMS THAT SHOULD BE REPORTED IMMEDIATELY: *FEVER GREATER THAN 100.4 F (38 C) OR HIGHER *CHILLS OR SWEATING *NAUSEA AND VOMITING THAT IS NOT CONTROLLED WITH YOUR NAUSEA MEDICATION *UNUSUAL SHORTNESS OF BREATH *UNUSUAL BRUISING OR BLEEDING *URINARY PROBLEMS (pain or burning when urinating, or frequent urination) *BOWEL PROBLEMS (unusual diarrhea, constipation, pain near the anus) TENDERNESS IN MOUTH AND THROAT WITH OR WITHOUT PRESENCE OF ULCERS (sore throat, sores in mouth, or a toothache) UNUSUAL RASH, SWELLING OR PAIN  UNUSUAL VAGINAL DISCHARGE OR ITCHING   Items with * indicate a potential emergency and should be followed up as soon as possible or go to the Emergency Department if any problems should occur.  Please show the CHEMOTHERAPY ALERT CARD or IMMUNOTHERAPY ALERT CARD at check-in to the Emergency Department and triage nurse.  Should you have questions after your visit or need to cancel or reschedule your appointment, please contact [facility]  [phone]  and follow the prompts.  Office hours are 8:00 a.m. to 4:30 p.m. Monday - Friday. Please note that voicemails left after 4:00 p.m. may not be returned until the following business day.  We are closed weekends and major holidays. You have access to a nurse at all times for urgent questions. Please call the main number to the clinic [phone] and follow the prompts.  For any non-urgent questions, you may also contact your provider using MyChart. We now offer e-Visits for anyone 33 and older to request care online for non-urgent symptoms. For details visit [email or web address].   Also download the MyChart app! Go to the app store, search "MyChart", open the app, select [facility], and log in with your MyChart username and password.  Due to Covid, a mask is required upon entering the hospital/clinic. If you do not have a mask, one will be given to you upon arrival. For doctor visits, patients may have 1 support person aged 50 or older with them. For treatment visits, patients cannot have anyone with them due to current Covid guidelines and our immunocompromised population.

## 2021-04-25 NOTE — Progress Notes (Signed)
Virtual Visit via Telephone Note  I connected with Abigail Wagner on 04/25/21 at 10:10 AM by telephone and verified that I am speaking with the correct person using two identifiers.  Location: Patient: Home Provider: [place] office    I discussed the limitations, risks, security and privacy concerns of performing an evaluation and management service by telephone and the availability of in person appointments. I also discussed with the patient that there may be a patient responsible charge related to this service. The patient expressed understanding and agreed to proceed.   I provided 50 minutes of non-face-to-face time during this encounter.   Abigail Smoker, LCSW    THERAPIST PROGRESS NOTE  Session Time: Monday 04/25/2021 10:10 AM - 11:00 AM   Participation Level: Active  Behavioral Response: Alert, depressed, anxious  Type of Therapy: Individual Therapy  Treatment Goals addressed: Establish therapeutic alliance, learn and implement cognitive and behavioral strategies to overcome depression  Interventions: CBT and Supportive  Summary: Abigail Wagner is a 70 y.o. female who is self referred due to worsening symptoms of depression. She currently sees Dr. Harrington Challenger for medication management. She had 1 psychiatric hospitalization due to suicide attempt about 10 years ago. She participated in therapy with this clinician briefly abut 10 years .Pt' husband accompanies her to assessment appointment and is concerned about the amount of medication she is taking. She was falling asleep just sitting up. Since husband took over managing medication, she seems to be doing better but still concerns about medication. Pt reports "Everything that has happened to me physically in one year (the diverticulitis), things happening in the world really bothers me and I can't control it.  Current symptoms include irritability, fatigue, difficulty concentrating, tearfulness, feelings of  hopelessness/worthlessness, decreased appetite, and excessive worry.  Patient last was seen via virtual visit about 2 weeks ago for the assessment appointment.  Per husband's report, one of her daughters reduced dosage of  lorazepam for patient to 1/2 pill 3 times a day.  He reports patient has improved balance and no longer is falling.  He also reports cognitive issues seem to be less.  However, patient still becomes very depressed especially late afternoons and early evenings.  Patient has tried to do more housekeeping and recently vacuumed.  She also has been walking.  Appetite has been good but patient still is unable to gain weight.  She is scheduled for a CT scan on August 12.  Patient reports continued crying spells and expresses frustration and sadness she is not like she used to be.   She reports her mental health started deteriorating when she had diverticulitis many months ago.  She continues to worry about the state of the world.  Suicidal/Homicidal: Nowithout intent/plan patient reports having passive suicidal ideations about 2-3 times since last session.  She talked with her husband who helped her work through this.  Patient denies any current suicidal ideations.  Therapist discussed safety issues with patient and husband.  They agree to call 911 or take patient to the ER should symptoms worsen.  They also have crisis contact information  Therapist Response: Reviewed symptoms, discussed stressors, facilitated expression of thoughts and feelings, validated feelings, gather more information from patient and husband regarding onset of symptoms, assisted patient identify support system, strengths,and her resources, discussed her spirituality, assisted patient identify ways to use spirituality to develop coping statements, developed plan with patient to use coping statements daily, also develop plan with patient to limit exposure to the  news, patient and her allso agreed to follow-up with medication  management appointment with Dr. Harrington Challenger later this week.  Plan: Return again in 2 weeks.  Diagnosis: Axis I: MDD, moderate        Abigail Smoker, LCSW 04/25/2021

## 2021-04-28 ENCOUNTER — Inpatient Hospital Stay (HOSPITAL_BASED_OUTPATIENT_CLINIC_OR_DEPARTMENT_OTHER): Payer: Medicare Other | Admitting: Nurse Practitioner

## 2021-04-28 ENCOUNTER — Inpatient Hospital Stay (HOSPITAL_COMMUNITY): Payer: Medicare Other

## 2021-04-28 ENCOUNTER — Other Ambulatory Visit: Payer: Self-pay

## 2021-04-28 VITALS — BP 95/43 | HR 69 | Temp 98.2°F | Resp 98 | Wt 116.4 lb

## 2021-04-28 DIAGNOSIS — C210 Malignant neoplasm of anus, unspecified: Secondary | ICD-10-CM

## 2021-04-28 DIAGNOSIS — D649 Anemia, unspecified: Secondary | ICD-10-CM | POA: Diagnosis not present

## 2021-04-28 DIAGNOSIS — C569 Malignant neoplasm of unspecified ovary: Secondary | ICD-10-CM

## 2021-04-28 NOTE — Progress Notes (Signed)
[place], [place] [address]   CLINIC:  Medical Oncology/Hematology  Virtual Visit Progress Note  I connected with Abigail Wagner on 04/28/21 at 10:40 AM EDT by video enabled telemedicine visit and verified that I am speaking with the correct person using two identifiers.   I discussed the limitations, risks, security and privacy concerns of performing an evaluation and management service by telemedicine and the availability of in-person appointments. I also discussed with the patient that there may be a patient responsible charge related to this service. The patient expressed understanding and agreed to proceed.   Other persons participating in the visit and their role in the encounter: RN, NP, Patient   Patient's location: [place]  Provider's location: Home   Chief Complaint: Follow up for anal cancer and ovarian cancer  PCP:  Susy Frizzle, MD [address] [place] [address] [phone]  REASON FOR VISIT:  Follow-up for anal cancer and ovarian cancer  PRIOR THERAPY:  1. Debulking surgery with BSO on 07/04/2014. 2. Chemoradiation therapy with 5-FU and mitomycin completed on 12/06/2015.  NGS Results: Not done  CURRENT THERAPY: Surveillance  BRIEF ONCOLOGIC HISTORY:  Oncology History  History of ovarian cancer  06/02/2014 Procedure   Diagnostic laparoscopy, lysis of adhesions, biopsy of omental nodules, biopsy right ovary by Dr Dalbert Batman   06/04/2014 Pathology Results   Diagnosis 1. Omentum, biopsy, Anterior peritoneal & omental nodule - SEROUS CARCINOMA WITH ASSOCIATED ABUNDANT PSAMMOMA BODIES AND DESMOPLASTIC STROMAL REACTION CONSISTENT WITH INVASIVE IMPLANTS. - SEE COMMENT. 2. Omentum, biopsy, Omental nodule - SEROUS CARCINOMA WITH ASSOCIATED ABUNDANT PSAMMOMA BODIES AND DESMOPLASTIC STROMAL REACTION CONSISTENT WITH INVASIVE IMPLANTS. - SEE COMMENT. 3. Ovary, biopsy/wedge resection, Right ovary - SMALL  FRAGMENTS OF ATYPICAL PAPILLARY PROLIFERATION WITH PSAMMOMA BODIES CONSISTENT WITH AT LEAST SEROUS BORDERLINE TUMOR.   07/03/2014 Procedure   Exploratory laparotomy, omentectomy, Bilateral salpingo-oophorectomy, inptraperitoneal PORT placement by Dr. Alycia Rossetti       07/24/2014 - 12/07/2014 Chemotherapy   Carboplatin/Paclitaxel x 6 cycles    08/17/2014 Procedure   Insertion of 8 French power port clear view tunneled venous vascular access device next line use of fluoroscopy for guidance and positioning Use of ultrasound for venipuncture Removal of intraperitoneal chemotherapy port By Dr. Dalbert Batman   05/14/2017 Genetic Testing   Negative genetic testing on the [facility] panel.  The [facility] gene panel offered by [employer] includes sequencing and deletion/duplication testing of the following 28 genes: APC, ATM, BARD1, BMPR1A, BRCA1, BRCA2, BRIP1, CHD1, CDK4, CDKN2A, CHEK2, EPCAM (large rearrangement only), MLH1, MSH2, MSH6, MUTYH, NBN, PALB2, PMS2, PTEN, RAD51C, RAD51D, SMAD4, STK11, and TP53. Sequencing was performed for select regions of POLE and POLD1, and large rearrangement analysis was performed for select regions of GREM1. The report date is April 27, 2017.  HRD testing was performed on the ovarian tumor.  The tumor was negative for any deleterious BRCA1 or BRCA2 mutations.    Genomic instability status was not interpretable.  Therefore, [facility] was unable to analyze the ovarian tumor for genomic instability.  The report date is May 14, 2017.    Anal cancer ([place])  09/09/2015 Procedure   Rectovaginal septal mass biopsy by Dr. Marcello Moores   09/10/2015 Pathology Results   Soft tissue mass, biopsy, rectovaginal septal mass - SQUAMOUS CELL CARCINOMA.   10/15/2015 PET scan   1. Focal hypermetabolism with associated ill-defined soft tissue fullness at the junction of the anterior anal wall and  posterior inferior vaginal wall, in keeping with the provided history of  a primary anal carcinoma. 2. No hypermetabolic locoregional or distant metastatic disease. 3. Status post hysterectomy, with no abnormal findings at the vaginal cuff. No evidence of hypermetabolic peritoneal tumor. 4. Tiny nonobstructing bilateral renal stones.   10/25/2015 - 12/06/2015 Radiation Therapy   [place], [place]   10/25/2015 - 12/06/2015 Chemotherapy   Mitomycin C and 5FU, by Dr. Ubaldo Glassing    06/15/2016 Procedure   EXCISION RECTOVAGINAOL FISTULA WITH MUCOSAL ADVANCEMENT FLAP by Dr. Maisie Fus   06/18/2016 Pathology Results   Fistula, rectovaginal SQUAMOUS, COLUMNAR JUNCTION MUCOSA AND SUBCUTANOUS SOFT TISSUE WITH FISTULA NO EVIDENCE OF MALIGNANCY   09/20/2016 Imaging   CT CAP- 1. No acute findings and no evidence for recurrent tumor or metastatic disease. 2. Aortic atherosclerosis and coronary artery calcification     CANCER STAGING: Cancer Staging Anal cancer (HCC) Staging form: Anus, AJCC 8th Edition - Clinical: Stage IIA (cT2, cN0, cM0) - Signed by Ellouise Newer, PA-C on 09/27/2016   INTERVAL HISTORY:  Abigail Wagner, a 70 y.o. female, returns for routine follow-up of her anal cancer and ovarian cancer. She has been battling diverticulitis recently. She had lost weight but has more recently been gaining. Generalized weakness is improving. Her PCP has ordered imaging which is scheduled for 05/06/21. Last colonoscopy was 10/12/20 with Dr. Levon Hedger. She denies constipation or diarrhea. No abdominal fullness. Bloating has improved. No melena or hematochezia. No leg swelling, shortness of breath. No neurologic complaints.    REVIEW OF SYSTEMS:  Review of Systems  Constitutional:  Positive for appetite change and fatigue. Negative for unexpected weight change.  HENT:   Negative for mouth sores, sore throat and trouble swallowing.   Respiratory:  Negative for chest tightness and shortness of breath.   Cardiovascular:  Negative for leg swelling.  Gastrointestinal:  Negative for  abdominal pain, constipation, diarrhea, nausea and vomiting.  Genitourinary:  Negative for bladder incontinence, difficulty urinating, dysuria, pelvic pain, vaginal bleeding and vaginal discharge.   Musculoskeletal:  Negative for flank pain and neck stiffness.  Skin:  Negative for itching, rash and wound.  Neurological:  Negative for dizziness, headaches, light-headedness and numbness.  Psychiatric/Behavioral:  Negative for confusion, depression and sleep disturbance. The patient is not nervous/anxious.    PAST MEDICAL/SURGICAL HISTORY:  Past Medical History:  Diagnosis Date   Allergy    Anal carcinoma [facility]) oncologist-  dr Galen [place] ([facility])   dx 12/ 2016 [facility] --  chemo and radiation- Dr. Maisie Fus   Anemia    due to her cancer   Anxiety    Dr. Tenny Craw at [place] (psychiatrist).     Carcinoma of ovary, stage 3 [facility]) oncologist-  dr gehrig/  dr Doristine Bosworth (cancer center in [place])--  no recurrency   dx 09/ 2015  Stage IIIB  papillary ovarian carcinoma  s/p  omentectomy and BSO &  chemotherapy (completed 12-07-2014)   Chemotherapy induced nausea and vomiting 10/23/2014   Chronic kidney disease    stones   DDD (degenerative disc disease), lumbosacral    GERD (gastroesophageal reflux disease)    Headache(784.0)    History of acute respiratory failure    05-30-2009  drug overdose-- (intubated for 2 days)  and aspiration pneumonia   History of adenomatous polyp of colon 10/07/2015   tubular adenoma high grade dysplasia   History of herpes genitalis    History of kidney stones    History of ovarian cancer 08/03/2015  History of suicide attempt    per documentation in epic  05-30-2009  overdose benaodiazepine   HTN (hypertension)    takes Metoprolol daily   Hyperlipidemia    Hypertension 09/27/2016   IBS (irritable bowel syndrome)    takes Bentyl daily   Internal carotid artery stenosis, left    <50%, ulcerative plaque at carotid bulb.    Major depression    Mood disorder  (HCC)    OA (osteoarthritis)    both hips   Ovarian cancer (HCC)    2015-TAH/BSO   Prolapse of vaginal vault after hysterectomy 07/20/2015   Rectovaginal fistula 08/05/2015   Sigmoid diverticulosis    Smokers' cough [facility])    Past Surgical History:  Procedure Laterality Date   BIOPSY  10/12/2020   Procedure: BIOPSY;  Surgeon: Dolores Frame, MD;  [facility];  Service: Gastroenterology;;   BREAST ENHANCEMENT SURGERY  1986   CESAREAN SECTION  1978   COLONOSCOPY N/A 10/07/2015   Procedure: COLONOSCOPY;  Surgeon: Malissa Hippo, MD;  [facility];  Service: Endoscopy;  Laterality: N/A;  7:30   COLONOSCOPY WITH PROPOFOL N/A 10/12/2020   Procedure: COLONOSCOPY WITH PROPOFOL;  Surgeon: Dolores Frame, MD;  [facility];  Service: Gastroenterology;  Laterality: N/A;  9:00   ESOPHAGOGASTRODUODENOSCOPY (EGD) WITH PROPOFOL N/A 10/12/2020   Procedure: ESOPHAGOGASTRODUODENOSCOPY (EGD) WITH PROPOFOL;  Surgeon: Dolores Frame, MD;  [facility];  Service: Gastroenterology;  Laterality: N/A;   EXPLORATORY LAPAROTOMY/ OMENTECTOMY/  BILATERAL SALPINGOOPHORECTOMY/  PORT-A-CATH PLACEMENT  07-03-2014   [place]   KNEE ARTHROSCOPY Left 09-22-2004   LAPAROSCOPY N/A 06/02/2014   Procedure: DIAGNOSTIC LAPAROSCOPY, OMENTAL BIOPSY, RIGHT OVARY BIOPSY, LYSIS OF ADHESIONS;  Surgeon: Claud Kelp, MD;  Location: [facility];  Service: General;  Laterality: N/A;   MUCOSAL ADVANCEMENT FLAP N/A 06/15/2016   Procedure: EXCISION RECTOVAGINAOL FISTULA WITH MUCOSAL ADVANCEMENT FLAP;  Surgeon: Romie Levee, MD;  Location: [facility];  Service: General;  Laterality: N/A;   PLACEMENT OF SETON N/A 09/09/2015   Procedure: PLACEMENT OF SETON;  Surgeon: Romie Levee, MD;  Location: [facility];  Service: General;  Laterality: N/A;   PORT-A-CATH REMOVAL Right 08/17/2014   Procedure: REMOVAL INTRAPERITONEAL CHEMO PORT;  Surgeon:  Claud Kelp, MD;  Location: [facility];  Service: General;  Laterality: Right;   PORTACATH PLACEMENT Right 08/17/2014   Procedure:  PLACE NEW PORT A CATH;  Surgeon: Claud Kelp, MD;  Location: [facility];  Service: General;  Laterality: Right;   RECTAL BIOPSY N/A 09/09/2015   Procedure: BIOPSY OF RECTOVAGINAL MASS;  Surgeon: Romie Levee, MD;  Location: [facility];  Service: General;  Laterality: N/A;   TUBAL LIGATION  YRS AGO   VAGINAL HYSTERECTOMY  1981   fibroids    SOCIAL HISTORY:  Social History   Socioeconomic History   Marital status: Married    Spouse name: Aurther Loft   Number of children: 3   Years of education: 12   Highest education level: Not on file  Occupational History   Occupation: retire    Comment: [employer]  Tobacco Use   Smoking status: Every Day    Packs/day: 0.50    Years: 43.00    Pack years: 21.50    Types: Cigarettes   Smokeless tobacco: Never  Vaping Use   Vaping Use: Never used  Substance and Sexual Activity   Alcohol use: No   Drug use: No  Sexual activity: Not Currently    Birth control/protection: Surgical    Comment: hyst  Other Topics Concern   Not on file  Social History Narrative   Lives at home with Coralyn Mark   Retired [employer]   Social Determinants of Health   Financial Resource Strain: Low Risk    Difficulty of Paying Living Expenses: Not very hard  Food Insecurity: Not on file  Transportation Needs: Not on file  Physical Activity: Not on file  Stress: Not on file  Social Connections: Not on file  Intimate Partner Violence: Not on file    FAMILY HISTORY:  Family History  Problem Relation Age of Onset   Bipolar disorder Mother    Anxiety disorder Mother    Dementia Mother    Depression Mother    Mental illness Mother    Vision loss Mother    Alzheimer's disease Mother    Alcohol abuse Paternal Uncle    Bipolar disorder Maternal Grandmother    Dementia Maternal  Grandmother    Alzheimer's disease Maternal Grandmother    Alcohol abuse Paternal Uncle    Stroke Brother    Deep vein thrombosis Son    Heart disease Father    Hyperlipidemia Father    Hypertension Father    Stroke Father    Vision loss Father    Atrial fibrillation Sister    Heart disease Brother    Heart disease Brother    ADD / ADHD Neg Hx    Drug abuse Neg Hx    OCD Neg Hx    Paranoid behavior Neg Hx    Schizophrenia Neg Hx    Seizures Neg Hx    Sexual abuse Neg Hx    Physical abuse Neg Hx     CURRENT MEDICATIONS:  Current Outpatient Medications  Medication Sig Dispense Refill   b complex vitamins tablet Take 1 tablet by mouth daily.     Cholecalciferol (VITAMIN D-3) 125 MCG (5000 UT) TABS Take 5,000 Units by mouth daily.     Coenzyme Q10 (CO Q 10) 100 MG CAPS Take 100 mg by mouth daily.     Collagen-Vitamin C-Biotin (COLLAGEN 1500/C PO) Take by mouth.     divalproex (DEPAKOTE ER) 500 MG 24 hr tablet Take 1 tablet (500 mg total) by mouth daily. 90 tablet 3   escitalopram (LEXAPRO) 20 MG tablet Take 1 tablet (20 mg total) by mouth 2 (two) times daily. 180 tablet 2   HORSE CHESTNUT ER PO Take by mouth.     KRILL OIL PO Take 350 mg by mouth daily.     Multiple Vitamin (MULTIVITAMIN) tablet Take 1 tablet by mouth daily.     Multiple Vitamins-Minerals (HAIR SKIN & NAILS ADVANCED PO) Take by mouth.     omeprazole (PRILOSEC) 40 MG capsule Take 1 capsule (40 mg total) by mouth daily. 90 capsule 3   OVER THE COUNTER MEDICATION Take 1 tablet by mouth in the morning and at bedtime. Patient reports that she is taking a ViViscal Tablet by mouth twice daily for hair loss.     polyethylene glycol (MIRALAX / GLYCOLAX) 17 g packet Take 17 g by mouth daily. (Patient taking differently: Take 17 g by mouth daily as needed for moderate constipation.) 30 each 1   rosuvastatin (CRESTOR) 20 MG tablet Take 1 tablet (20 mg total) by mouth daily. 90 tablet 3   vitamin B-12 (CYANOCOBALAMIN) 1000 MCG  tablet Take 1,000 mcg by mouth daily.     docusate sodium (  COLACE) 100 MG capsule Take 1 capsule (100 mg total) by mouth daily as needed for moderate constipation. (Patient not taking: Reported on 04/28/2021) 30 capsule 2   HYDROcodone-acetaminophen (NORCO) 5-325 MG tablet Take 1 tablet by mouth every 6 (six) hours as needed for moderate pain. (Patient not taking: Reported on 04/28/2021) 30 tablet 0   LORazepam (ATIVAN) 1 MG tablet Take 1 tablet (1 mg total) by mouth 3 (three) times daily as needed for anxiety. (Patient not taking: Reported on 04/28/2021) 90 tablet 5   ondansetron (ZOFRAN) 4 MG tablet TAKE 1 TABLET BY MOUTH EVERY 8 HOURS AS NEEDED FOR NAUSEA AND VOMITING (Patient not taking: Reported on 04/28/2021) 30 tablet 1   No current facility-administered medications for this visit.    ALLERGIES:  Allergies  Allergen Reactions   Morphine And Related     Delirium with sbo    PHYSICAL EXAM:  Performance status (ECOG): 1 - Symptomatic but completely ambulatory  Vitals:   04/28/21 1044  BP: (!) 95/43  Pulse: 69  Resp: (!) 98  Temp: 98.2 F (36.8 C)  SpO2: 96%   Wt Readings from Last 3 Encounters:  04/28/21 116 lb 6.4 oz (52.8 kg)  04/12/21 116 lb 6.4 oz (52.8 kg)  03/07/21 122 lb (55.3 kg)   Physical Exam Constitutional:      General: She is not in acute distress. HENT:     Head: Normocephalic.  Pulmonary:     Effort: No respiratory distress.  Neurological:     Mental Status: She is alert and oriented to person, place, and time.  Psychiatric:        Mood and Affect: Mood normal.        Behavior: Behavior normal.     LABORATORY DATA:  I have reviewed the labs as listed.  CBC Latest Ref Rng & Units 04/25/2021 04/12/2021 03/07/2021  WBC 4.0 - 10.5 K/uL 3.2(L) 3.4(L) 3.5(L)  Hemoglobin 12.0 - 15.0 g/dL 10.6(L) 10.9(L) 10.4(L)  Hematocrit 36.0 - 46.0 % 33.1(L) 32.9(L) 31.1(L)  Platelets 150 - 400 K/uL 183 187 167   CMP Latest Ref Rng & Units 03/07/2021 01/10/2021 12/14/2020   Glucose 65 - 99 mg/dL 58(L) 76 99  BUN 7 - 25 mg/dL $Remove'22 22 19  'viyELRc$ Creatinine 0.60 - 0.93 mg/dL 0.77 0.89 0.56  Sodium 135 - 146 mmol/L 144 142 137  Potassium 3.5 - 5.3 mmol/L 4.9 4.7 4.1  Chloride 98 - 110 mmol/L 109 105 101  CO2 20 - 32 mmol/L $RemoveB'27 27 26  'lsSMAlJv$ Calcium 8.6 - 10.4 mg/dL 9.0 9.4 8.8(L)  Total Protein 6.1 - 8.1 g/dL 6.1 6.6 6.8  Total Bilirubin 0.2 - 1.2 mg/dL 0.2 0.2 0.2(L)  Alkaline Phos 38 - 126 U/L - - 68  AST 10 - 35 U/L 35 23 26  ALT 6 - 29 U/L $Remo'23 11 21    'cgZDK$ DIAGNOSTIC IMAGING:   No results found.   ASSESSMENT & PLAN:  1.  Stage II (T2 N0 M0) anal cancer: -Diagnosed in December 2016. -Chemoradiation therapy with 5-FU and mitomycin completed on 12/06/2015, treated at [facility]. -she has been released from follow up by Dr. Julious Oka that she is > 5 years from completing treatment  - Colonoscopy on 10/12/20 showed diverticulosis of the sigmoid colon   2.  Stage III ovarian cancer: -Diagnosed in 2015, status post debulking surgery followed by 6 cycles of carboplatin and Taxol completed in March 2016. - Germline and HRD testing were negative  - CT from 05/25/20  did not show evidence of mass or recurrence.  - clinically asymptomatic.  - CA 125 was not significantly elevated at diagnosis (21.6 on 06/29/14)but has been followed annually - Reviewed NCCN surveillance guidelines including annual surveillance visits including pelvic exam. Imaging and bloodwork is performed as clinically indicated.  - patient to reach out to her pcp to see if they are comfortable doing her pelvic exams.   3.  Normocytic anemia: - Hemoglobin slightly worse at 10.6. Normocytic.  - Ferritin and iron studies normal - Suspect anemia of chronic disease - given her history of malignancy I agree with plan for CT scans to evaluate further. If negative, likely related to her recent diverticulitis flare.  - Plan to have her return in 3 months to repeat labs and re-evaluation    Orders placed this  encounter:  No orders of the defined types were placed in this encounter.   I discussed the assessment and treatment plan with the patient. The patient was provided an opportunity to ask questions and all were answered. The patient agreed with the plan and demonstrated an understanding of the instructions.   The patient was advised to call back or seek an in-person evaluation if the symptoms worsen or if the condition fails to improve as anticipated.   I spent 20 minutes face-to-face video visit time dedicated to the care of this patient on the date of this encounter to include pre-visit review of medical oncology, imaging, lab studies, face-to-face time with the patient, and post visit ordering of testing/documentation.   Beckey Rutter, DNP, AGNP-C [place]

## 2021-04-29 ENCOUNTER — Encounter (HOSPITAL_COMMUNITY): Payer: Self-pay | Admitting: Psychiatry

## 2021-04-29 ENCOUNTER — Telehealth (INDEPENDENT_AMBULATORY_CARE_PROVIDER_SITE_OTHER): Payer: Medicare Other | Admitting: Psychiatry

## 2021-04-29 DIAGNOSIS — F331 Major depressive disorder, recurrent, moderate: Secondary | ICD-10-CM

## 2021-04-29 LAB — CA 125: Cancer Antigen (CA) 125: 11.9 U/mL (ref 0.0–38.1)

## 2021-04-29 MED ORDER — ESCITALOPRAM OXALATE 20 MG PO TABS: 20.0000 mg | ORAL_TABLET | Freq: Two times a day (BID) | ORAL | 2 refills | Status: DC

## 2021-04-29 MED ORDER — DIVALPROEX SODIUM ER 500 MG PO TB24: 500.0000 mg | ORAL_TABLET | Freq: Every day | ORAL | 3 refills | Status: DC

## 2021-04-29 NOTE — Progress Notes (Signed)
Virtual Visit via Telephone Note  I connected with Abigail Wagner on 04/29/21 at  9:40 AM EDT by telephone and verified that I am speaking with the correct person using two identifiers.  Location: Patient: home Provider: home office   I discussed the limitations, risks, security and privacy concerns of performing an evaluation and management service by telephone and the availability of in person appointments. I also discussed with the patient that there may be a patient responsible charge related to this service. The patient expressed understanding and agreed to proceed.      I discussed the assessment and treatment plan with the patient. The patient was provided an opportunity to ask questions and all were answered. The patient agreed with the plan and demonstrated an understanding of the instructions.   The patient was advised to call back or seek an in-person evaluation if the symptoms worsen or if the condition fails to improve as anticipated.  I provided 15 minutes of non-face-to-face time during this encounter.   Abigail Spiller, MD  [facility] MD/PA/NP OP Progress Note  04/29/2021 10:01 AM Abigail Wagner FLOYD PARILLA  MRN:  [id number]  Chief Complaint:  Chief Complaint   Anxiety; Depression; Follow-up    HPI: This patient is a 70 year old married white female lives with her husband in [place]. She has 3 children and 5 grandchildren. She is retired from [employer].   The patient states she's had depression since her 92s. She was hospitalized several times, last time being in 2010 when she took a drug overdose. She was going through a lot of family stress back then. She states that she was hospitalized at [facility] and since then she has never wanted to go back. She's been quite stable despite her low doses of medication. She's also stopped drinking alcohol which is made a big difference  Patient returns for follow-up after 3 months.  She had not been doing well recently.  She had  diverticulitis about 6 months ago and since then had felt sick every time she ate.  She kept cutting down her food intake and has lost down to 111 pounds.  She was feeling weak and shaky all the time.  Her husband also reports she was probably overusing the Ativan.  She had seen Dr. Cindi Carbon her primary physician last month.  He was very concerned about the weight loss and she is trying to add more protein and to do better she is gained up to 114 116 pounds.  She has been very depressed at times about the whole situation but seems to be doing a little bit better now.  She also gets confused easily and seems to be experiencing memory loss at times.  I spoke to the patient and husband at length about this I doubt that this is dementia but probably multifactorial due to her poor p.o. intake and subsequent physical and mental decline.  We need to give her time to regain some weight and energy.  Her labs are good although she is anemic.  She is awaiting of CT scan to look for any occult malignancies.  Dr. Cindi Carbon had cut down her Ativan to 0.5 mg 3 times daily and this seems to be doing better as she is walking more evenly and is less oversedated. Visit Diagnosis:    ICD-10-CM   1. Major depressive disorder, recurrent episode, moderate (HCC)  F33.1 escitalopram (LEXAPRO) 20 MG tablet      Past Psychiatric History: Past hospitalizations for depression and alcohol  abuse, none since 2010  Past Medical History:  Past Medical History:  Diagnosis Date   Allergy    Anal carcinoma [facility]) oncologist-  dr Whitney Muse ([facility])   dx 12/ 2016 [facility] --  chemo and radiation- Dr. Marcello Moores   Anemia    due to her cancer   Anxiety    Dr. Harrington Challenger at [place] (psychiatrist).     Carcinoma of ovary, stage 3 [facility]) oncologist-  dr gehrig/  dr Doreene Eland (cancer center in [place] [facility])--  no recurrency   dx 09/ 2015  Stage IIIB  papillary ovarian carcinoma  s/p  omentectomy and BSO &  chemotherapy (completed 12-07-2014)    Chemotherapy induced nausea and vomiting 10/23/2014   Chronic kidney disease    stones   DDD (degenerative disc disease), lumbosacral    GERD (gastroesophageal reflux disease)    Headache(784.0)    History of acute respiratory failure    05-30-2009  drug overdose-- (intubated for 2 days)  and aspiration pneumonia   History of adenomatous polyp of colon 10/07/2015   tubular adenoma high grade dysplasia   History of herpes genitalis    History of kidney stones    History of ovarian cancer 08/03/2015   History of suicide attempt    per documentation in epic  05-30-2009  overdose benaodiazepine   HTN (hypertension)    takes Metoprolol daily   Hyperlipidemia    Hypertension 09/27/2016   IBS (irritable bowel syndrome)    takes Bentyl daily   Internal carotid artery stenosis, left    <50%, ulcerative plaque at carotid bulb.    Major depression    Mood disorder (HCC)    OA (osteoarthritis)    both hips   Ovarian cancer ([place])    2015-TAH/BSO   Prolapse of vaginal vault after hysterectomy 07/20/2015   Rectovaginal fistula 08/05/2015   Sigmoid diverticulosis    Smokers' cough [facility])     Past Surgical History:  Procedure Laterality Date   BIOPSY  10/12/2020   Procedure: BIOPSY;  Surgeon: Harvel Quale, MD;  [facility];  Service: Gastroenterology;;   [place]   COLONOSCOPY N/A 10/07/2015   Procedure: COLONOSCOPY;  Surgeon: Rogene Houston, MD;  [facility];  Service: Endoscopy;  Laterality: N/A;  7:30   COLONOSCOPY WITH PROPOFOL N/A 10/12/2020   Procedure: COLONOSCOPY WITH PROPOFOL;  Surgeon: Harvel Quale, MD;  [facility];  Service: Gastroenterology;  Laterality: N/A;  9:00   ESOPHAGOGASTRODUODENOSCOPY (EGD) WITH PROPOFOL N/A 10/12/2020   Procedure: ESOPHAGOGASTRODUODENOSCOPY (EGD) WITH PROPOFOL;  Surgeon: Harvel Quale, MD;  [facility];  Service:  Gastroenterology;  Laterality: N/A;   EXPLORATORY LAPAROTOMY/ OMENTECTOMY/  BILATERAL SALPINGOOPHORECTOMY/  PORT-A-CATH PLACEMENT  07-03-2014   [place]   KNEE ARTHROSCOPY Left 09-22-2004   LAPAROSCOPY N/A 06/02/2014   Procedure: DIAGNOSTIC LAPAROSCOPY, OMENTAL BIOPSY, RIGHT OVARY BIOPSY, LYSIS OF ADHESIONS;  Surgeon: Fanny Skates, MD;  Location: [place] [facility];  Service: General;  Laterality: N/A;   MUCOSAL ADVANCEMENT FLAP N/A 06/15/2016   Procedure: EXCISION RECTOVAGINAOL FISTULA WITH MUCOSAL ADVANCEMENT FLAP;  Surgeon: Leighton Ruff, MD;  Location: [place];  Service: General;  Laterality: N/A;   [place] N/A 09/09/2015   Procedure: PLACEMENT OF SETON;  Surgeon: Leighton Ruff, MD;  Location: [facility];  Service: General;  Laterality: N/A;   PORT-A-CATH REMOVAL Right 08/17/2014   Procedure: REMOVAL INTRAPERITONEAL CHEMO  PORT;  Surgeon: Fanny Skates, MD;  Location: [place];  Service: General;  Laterality: Right;   PORTACATH PLACEMENT Right 08/17/2014   Procedure:  PLACE NEW PORT A CATH;  Surgeon: Fanny Skates, MD;  Location: [place];  Service: General;  Laterality: Right;   RECTAL BIOPSY N/A 09/09/2015   Procedure: BIOPSY OF RECTOVAGINAL MASS;  Surgeon: Leighton Ruff, MD;  Location: [place];  Service: General;  Laterality: N/A;   TUBAL LIGATION  YRS AGO   VAGINAL HYSTERECTOMY  1981   fibroids    Family Psychiatric History: see below  Family History:  Family History  Problem Relation Age of Onset   Bipolar disorder Mother    Anxiety disorder Mother    Dementia Mother    Depression Mother    Mental illness Mother    Vision loss Mother    Alzheimer's disease Mother    Alcohol abuse Paternal Uncle    Bipolar disorder Maternal Grandmother    Dementia Maternal Grandmother    Alzheimer's disease Maternal Grandmother    Alcohol abuse Paternal Uncle    Stroke Brother    Deep vein thrombosis  Son    Heart disease Father    Hyperlipidemia Father    Hypertension Father    Stroke Father    Vision loss Father    Atrial fibrillation Sister    Heart disease Brother    Heart disease Brother    ADD / ADHD Neg Hx    Drug abuse Neg Hx    OCD Neg Hx    Paranoid behavior Neg Hx    Schizophrenia Neg Hx    Seizures Neg Hx    Sexual abuse Neg Hx    Physical abuse Neg Hx     Social History:  Social History   Socioeconomic History   Marital status: Married    Spouse name: Coralyn Mark   Number of children: 3   Years of education: 12   Highest education level: Not on file  Occupational History   Occupation: retire    Comment: [employer]  Tobacco Use   Smoking status: Every Day    Packs/day: 0.50    Years: 43.00    Pack years: 21.50    Types: Cigarettes   Smokeless tobacco: Never  Vaping Use   Vaping Use: Never used  Substance and Sexual Activity   Alcohol use: No   Drug use: No   Sexual activity: Not Currently    Birth control/protection: Surgical    Comment: hyst  Other Topics Concern   Not on file  Social History Narrative   Lives at home with Coralyn Mark   Retired [employer]   Social Determinants of Health   Financial Resource Strain: Low Risk    Difficulty of Paying Living Expenses: Not very hard  Food Insecurity: Not on file  Transportation Needs: Not on file  Physical Activity: Not on file  Stress: Not on file  Social Connections: Not on file    Allergies:  Allergies  Allergen Reactions   Morphine And Related     Delirium with sbo    Metabolic Disorder Labs: No results found for: HGBA1C, MPG No results found for: PROLACTIN Lab Results  Component Value Date   CHOL 152 01/10/2021   TRIG 56 01/10/2021   HDL 58 01/10/2021   CHOLHDL 2.6 01/10/2021   VLDL 28 01/10/2017   LDLCALC 80 01/10/2021   LDLCALC 69 04/08/2020     Therapeutic Level Labs: No results  found for: LITHIUM No results found for: VALPROATE No components found for:  CBMZ  Current  Medications: Current Outpatient Medications  Medication Sig Dispense Refill   b complex vitamins tablet Take 1 tablet by mouth daily.     Cholecalciferol (VITAMIN D-3) 125 MCG (5000 UT) TABS Take 5,000 Units by mouth daily.     Coenzyme Q10 (CO Q 10) 100 MG CAPS Take 100 mg by mouth daily.     Collagen-Vitamin C-Biotin (COLLAGEN 1500/C PO) Take by mouth.     divalproex (DEPAKOTE ER) 500 MG 24 hr tablet Take 1 tablet (500 mg total) by mouth daily. 90 tablet 3   docusate sodium (COLACE) 100 MG capsule Take 1 capsule (100 mg total) by mouth daily as needed for moderate constipation. (Patient not taking: Reported on 04/28/2021) 30 capsule 2   escitalopram (LEXAPRO) 20 MG tablet Take 1 tablet (20 mg total) by mouth 2 (two) times daily. 180 tablet 2   HORSE CHESTNUT ER PO Take by mouth.     HYDROcodone-acetaminophen (NORCO) 5-325 MG tablet Take 1 tablet by mouth every 6 (six) hours as needed for moderate pain. (Patient not taking: Reported on 04/28/2021) 30 tablet 0   KRILL OIL PO Take 350 mg by mouth daily.     Multiple Vitamin (MULTIVITAMIN) tablet Take 1 tablet by mouth daily.     Multiple Vitamins-Minerals (HAIR SKIN & NAILS ADVANCED PO) Take by mouth.     omeprazole (PRILOSEC) 40 MG capsule Take 1 capsule (40 mg total) by mouth daily. 90 capsule 3   ondansetron (ZOFRAN) 4 MG tablet TAKE 1 TABLET BY MOUTH EVERY 8 HOURS AS NEEDED FOR NAUSEA AND VOMITING (Patient not taking: Reported on 04/28/2021) 30 tablet 1   OVER THE COUNTER MEDICATION Take 1 tablet by mouth in the morning and at bedtime. Patient reports that she is taking a ViViscal Tablet by mouth twice daily for hair loss.     polyethylene glycol (MIRALAX / GLYCOLAX) 17 g packet Take 17 g by mouth daily. (Patient taking differently: Take 17 g by mouth daily as needed for moderate constipation.) 30 each 1   rosuvastatin (CRESTOR) 20 MG tablet Take 1 tablet (20 mg total) by mouth daily. 90 tablet 3   vitamin B-12 (CYANOCOBALAMIN) 1000 MCG tablet Take  1,000 mcg by mouth daily.     No current facility-administered medications for this visit.     Musculoskeletal: Strength & Muscle Tone: decreased Gait & Station: normal Patient leans: N/A  Psychiatric Specialty Exam: Review of Systems  Constitutional:  Positive for appetite change, fatigue and unexpected weight change.  Gastrointestinal:  Positive for abdominal pain.  Psychiatric/Behavioral:  The patient is nervous/anxious.    There were no vitals taken for this visit.There is no height or weight on file to calculate BMI.  General Appearance: NA  Eye Contact:  NA  Speech:  Clear and Coherent  Volume:  Normal  Mood:  Anxious  Affect:  NA  Thought Process:  Goal Directed  Orientation:  Full (Time, Place, and Person)  Thought Content: Rumination   Suicidal Thoughts:  No  Homicidal Thoughts:  No  Memory:  Immediate;   Fair Recent;   Fair Remote;   NA  Judgement:  Impaired  Insight:  Shallow  Psychomotor Activity:  Decreased and Shuffling Gait  Concentration:  Concentration: Fair and Attention Span: Fair  Recall:  [employer] of Knowledge: Good  Language: Good  Akathisia:  No  Handed:  Right  AIMS (if indicated): not  done  Assets:  Communication Skills Desire for Improvement Resilience Social Support  ADL's:  Intact  Cognition: Impaired,  Mild  Sleep:  Fair   Screenings: GAD-7    Flowsheet Row Clinical Support from 01/10/2021 in [facility]  Total GAD-7 Score 8      PHQ2-9    Flowsheet Row Video Visit from 04/29/2021 in [facility] from 04/11/2021 in [place] Video Visit from 02/07/2021 in [facility] from 01/10/2021 in [place] Office Visit from 06/08/2020 in [facility]  PHQ-2 Total Score '3 6 1 4 1  '$ PHQ-9 Total Score 10 21 -- 11 --      Flowsheet Row  Video Visit from 04/29/2021 in [facility] from 04/11/2021 in [facility] Video Visit from 02/07/2021 in [place] Error: Q7 should not be populated when Q6 is No Low Risk No Risk        Assessment and Plan: This patient is a 70 year old female with a history of depression and anxiety.  Her significant weight loss due to diverticulitis has caused serious problems with both physical and mental health.  She is slowly working on getting her strength Improve nutrition.  For now however she will continue Lexapro 20 mg twice daily for depression, Depakote ER 500 mg at bedtime for mood stabilization.  She will continue Ativan at the half dose-0.5 mg 3 times daily.  Her husband is dispensing all medications.  She will return to see me in 6 weeks   Abigail Spiller, MD 04/29/2021, 10:01 AM

## 2021-05-03 ENCOUNTER — Encounter: Payer: Self-pay | Admitting: Family Medicine

## 2021-05-06 ENCOUNTER — Other Ambulatory Visit: Payer: Medicare Other

## 2021-05-09 ENCOUNTER — Other Ambulatory Visit: Payer: Self-pay

## 2021-05-09 ENCOUNTER — Ambulatory Visit (INDEPENDENT_AMBULATORY_CARE_PROVIDER_SITE_OTHER): Payer: Medicare Other | Admitting: Psychiatry

## 2021-05-09 DIAGNOSIS — F331 Major depressive disorder, recurrent, moderate: Secondary | ICD-10-CM

## 2021-05-09 NOTE — Progress Notes (Addendum)
Virtual Visit via Telephone Note  I connected with [name] on 05/09/21 at 10:08 AM EDT  by telephone and verified that I am speaking with the correct person using two identifiers.  Location: Patient: Home Provider: [place] office    I discussed the limitations, risks, security and privacy concerns of performing an evaluation and management service by telephone and the availability of in person appointments. I also discussed with the patient that there may be a patient responsible charge related to this service. The patient expressed understanding and agreed to proceed.   I provided 49 minutes of non-face-to-face time during this encounter.   Abigail Smoker, LCSW   THERAPIST PROGRESS NOTE  Session Time: Monday 05/09/2021 10:08 AM - 10:57 AM   Participation Level: Active  Behavioral Response: Alert, depressed, anxious  Type of Therapy: Individual Therapy  Treatment Goals addressed: Elevate mood and show evidence of usual energy, activities, and socialization level AEB patient exercising 30 minutes/day 4 days/week for 3 consecutive weeks, going out participating in social/recreational activities (going for ride, shopping, going out with daughter) 2 times per week for 3 consecutive weeks.   Interventions: CBT and Supportive  Summary: Abigail Wagner is a 70 y.o. female who is self referred due to worsening symptoms of depression. She currently sees Dr. Harrington Challenger for medication management. She had 1 psychiatric hospitalization due to suicide attempt about 10 years ago. She participated in therapy with this clinician briefly abut 10 years .Pt' husband accompanies her to assessment appointment and is concerned about the amount of medication she is taking. She was falling asleep just sitting up. Since husband took over managing medication, she seems to be doing better but still concerns about medication. Pt reports "Everything that has happened to me physically in one year (the  diverticulitis), things happening in the world really bothers me and I can't control it.  Current symptoms include irritability, fatigue, difficulty concentrating, tearfulness, feelings of hopelessness/worthlessness, decreased appetite, and excessive worry.  Patient last was seen via virtual visit about 2 weeks ago.  She reports decreased intensity and frequency of symptoms of depression since last session.  She continues to experience tearfulness but this now occurs every other day instead of daily.  She reports decreased worry about her health/situation as she has been using coping statements using her spirituality.  However, she continues to worry about how her mental state decompensated.  She also expresses sadness and frustration she is unable to drive.  She reports increased involvement in household activity including mopping and waxing her floors, cleaning baseboards, and cleaning out her closet.  She also reports enjoying going out with her daughter this past weekend.    Suicidal/Homicidal: Nowithout intent/plan patient reports having passive suicidal ideations about 2-3 times since last session.  She talked with her husband who helped her work through this.  Patient denies any current suicidal ideations.  Therapist discussed safety issues with patient and husband.  They agree to call 911 or take patient to the ER should symptoms worsen.  They also have crisis contact information    Therapist Response: Reviewed symptoms, administered PHQ 2 and 9 with C-SS RS, praised and reinforced patient's use of coping statements, discussed effects, praised and reinforced patient's increased involvement in activity as well as going out with her daughter, discussed effects on her mood, discussed stressors, facilitated expression of thoughts and feelings, validated feelings put particularly grief and loss regarding her changed functioning, developed treatment plan, obtained patient's permission to initial  plan for  patient as this was a virtual visit, began to discuss the role of behavioral activation and coping with depression, will send patient daily planning handouts and activity menu in preparation for next session   Plan: Return again in 2 weeks.  Diagnosis: Axis I: MDD, moderate        Abigail Smoker, LCSW 05/09/2021

## 2021-05-10 ENCOUNTER — Encounter: Payer: Self-pay | Admitting: Family Medicine

## 2021-05-10 ENCOUNTER — Other Ambulatory Visit: Payer: Self-pay | Admitting: Family Medicine

## 2021-05-10 MED ORDER — HYDROCODONE-ACETAMINOPHEN 5-325 MG PO TABS: 1.0000 | ORAL_TABLET | Freq: Four times a day (QID) | ORAL | 0 refills | Status: DC | PRN

## 2021-05-18 ENCOUNTER — Encounter: Payer: Self-pay | Admitting: Family Medicine

## 2021-05-20 ENCOUNTER — Encounter: Payer: Self-pay | Admitting: Family Medicine

## 2021-05-20 ENCOUNTER — Ambulatory Visit (INDEPENDENT_AMBULATORY_CARE_PROVIDER_SITE_OTHER): Payer: Medicare Other | Admitting: Family Medicine

## 2021-05-20 ENCOUNTER — Other Ambulatory Visit: Payer: Self-pay

## 2021-05-20 ENCOUNTER — Encounter (HOSPITAL_COMMUNITY): Payer: Self-pay | Admitting: Physical Therapy

## 2021-05-20 ENCOUNTER — Ambulatory Visit (HOSPITAL_COMMUNITY): Payer: Medicare Other | Attending: Family Medicine | Admitting: Physical Therapy

## 2021-05-20 VITALS — BP 126/80 | HR 60 | Temp 98.0°F | Resp 16 | Ht 65.0 in | Wt 124.0 lb

## 2021-05-20 DIAGNOSIS — R600 Localized edema: Secondary | ICD-10-CM | POA: Diagnosis not present

## 2021-05-20 DIAGNOSIS — G8929 Other chronic pain: Secondary | ICD-10-CM | POA: Insufficient documentation

## 2021-05-20 DIAGNOSIS — M6281 Muscle weakness (generalized): Secondary | ICD-10-CM | POA: Insufficient documentation

## 2021-05-20 DIAGNOSIS — Z9181 History of falling: Secondary | ICD-10-CM | POA: Diagnosis not present

## 2021-05-20 DIAGNOSIS — M545 Low back pain, unspecified: Secondary | ICD-10-CM | POA: Diagnosis not present

## 2021-05-20 DIAGNOSIS — M7989 Other specified soft tissue disorders: Secondary | ICD-10-CM | POA: Diagnosis not present

## 2021-05-20 LAB — COMPLETE METABOLIC PANEL WITH GFR
AG Ratio: 2 (calc) (ref 1.0–2.5)
ALT: 13 U/L (ref 6–29)
AST: 27 U/L (ref 10–35)
Albumin: 4.1 g/dL (ref 3.6–5.1)
Alkaline phosphatase (APISO): 41 U/L (ref 37–153)
BUN/Creatinine Ratio: 31 (calc) — ABNORMAL HIGH (ref 6–22)
BUN: 27 mg/dL — ABNORMAL HIGH (ref 7–25)
CO2: 26 mmol/L (ref 20–32)
Calcium: 8.8 mg/dL (ref 8.6–10.4)
Chloride: 110 mmol/L (ref 98–110)
Creat: 0.88 mg/dL (ref 0.60–1.00)
Globulin: 2.1 g/dL (calc) (ref 1.9–3.7)
Glucose, Bld: 65 mg/dL (ref 65–99)
Potassium: 4.4 mmol/L (ref 3.5–5.3)
Sodium: 145 mmol/L (ref 135–146)
Total Bilirubin: 0.2 mg/dL (ref 0.2–1.2)
Total Protein: 6.2 g/dL (ref 6.1–8.1)
eGFR: 71 mL/min/{1.73_m2} (ref >=60)

## 2021-05-20 NOTE — Therapy (Addendum)
[facility] [address] [place], [place], [address] Phone: [phone]   Fax:  [phone]  Physical Therapy Evaluation and Discharge Note  Patient Details  Name: Abigail Wagner MRN: [id number] Date of Birth: [birth year] Referring Provider (PT): Susy Frizzle MD  PHYSICAL THERAPY DISCHARGE SUMMARY  Visits from [facility] of Care: 1  Current functional level related to goals / functional outcomes: Unable to assess due to unplanned discharge   Remaining deficits: Unable to assess due to unplanned discharge   Education / Equipment: Unable to assess due to unplanned discharge  Patient agrees to discharge. Patient goals were not met. Patient is being discharged due to not returning since the last visit.  9:19 AM, 07/05/21 Jerene Pitch, DPT Physical Therapy with [facility]  [phone] office    Encounter Date: 05/20/2021   PT End of Session - 05/20/21 1307     Visit Number 1    Number of Visits 8    Date for PT Re-Evaluation 07/15/21    Authorization Type [employer] medicare, no auth or VL    Progress Note Due on Visit 10    PT Start Time 1315    PT Stop Time 1400    PT Time Calculation (min) 45 min    Equipment Utilized During Treatment Gait belt    Activity Tolerance Patient limited by fatigue    Behavior During Therapy [facility] for tasks assessed/performed             Past Medical History:  Diagnosis Date   Allergy    Anal carcinoma [facility]) oncologist-  dr Whitney Muse ([facility])   dx 12/ 2016 [facility] --  chemo and radiation- Dr. Marcello Moores   Anemia    due to her cancer   Anxiety    Dr. Harrington Challenger at [place] (psychiatrist).     Carcinoma of ovary, stage 3 [facility]) oncologist-  dr gehrig/  dr Doreene Eland (cancer center in [place] w/ [facility])--  no recurrency   dx 09/ 2015  Stage IIIB  papillary ovarian carcinoma  s/p  omentectomy and BSO &  chemotherapy (completed 12-07-2014)   Chemotherapy induced nausea and vomiting  10/23/2014   Chronic kidney disease    stones   DDD (degenerative disc disease), lumbosacral    GERD (gastroesophageal reflux disease)    Headache(784.0)    History of acute respiratory failure    05-30-2009  drug overdose-- (intubated for 2 days)  and aspiration pneumonia   History of adenomatous polyp of colon 10/07/2015   tubular adenoma high grade dysplasia   History of herpes genitalis    History of kidney stones    History of ovarian cancer 08/03/2015   History of suicide attempt    per documentation in epic  05-30-2009  overdose benaodiazepine   HTN (hypertension)    takes Metoprolol daily   Hyperlipidemia    Hypertension 09/27/2016   IBS (irritable bowel syndrome)    takes Bentyl daily   Internal carotid artery stenosis, left    <50%, ulcerative plaque at carotid bulb.    Major depression    Mood disorder (HCC)    OA (osteoarthritis)    both hips   Ovarian cancer ([place])    2015-TAH/BSO   Prolapse of vaginal vault after hysterectomy 07/20/2015   Rectovaginal fistula 08/05/2015   Sigmoid diverticulosis    Smokers' cough [facility])     Past Surgical History:  Procedure Laterality Date   BIOPSY  10/12/2020   Procedure: BIOPSY;  Surgeon: Montez Morita, Quillian Quince, MD;  [facility];  Service: Gastroenterology;;   [place]   COLONOSCOPY N/A 10/07/2015   Procedure: COLONOSCOPY;  Surgeon: Rogene Houston, MD;  [facility];  Service: Endoscopy;  Laterality: N/A;  7:30   COLONOSCOPY WITH PROPOFOL N/A 10/12/2020   Procedure: COLONOSCOPY WITH PROPOFOL;  Surgeon: Harvel Quale, MD;  [facility];  Service: Gastroenterology;  Laterality: N/A;  9:00   ESOPHAGOGASTRODUODENOSCOPY (EGD) WITH PROPOFOL N/A 10/12/2020   Procedure: ESOPHAGOGASTRODUODENOSCOPY (EGD) WITH PROPOFOL;  Surgeon: Harvel Quale, MD;  [facility];  Service: Gastroenterology;  Laterality: N/A;   EXPLORATORY  LAPAROTOMY/ OMENTECTOMY/  BILATERAL SALPINGOOPHORECTOMY/  PORT-A-CATH PLACEMENT  07-03-2014   [place]   KNEE ARTHROSCOPY Left 09-22-2004   LAPAROSCOPY N/A 06/02/2014   Procedure: DIAGNOSTIC LAPAROSCOPY, OMENTAL BIOPSY, RIGHT OVARY BIOPSY, LYSIS OF ADHESIONS;  Surgeon: Fanny Skates, MD;  Location: [facility];  Service: General;  Laterality: N/A;   MUCOSAL ADVANCEMENT FLAP N/A 06/15/2016   Procedure: EXCISION RECTOVAGINAOL FISTULA WITH MUCOSAL ADVANCEMENT FLAP;  Surgeon: Leighton Ruff, MD;  Location: [facility];  Service: General;  Laterality: N/A;   [place] N/A 09/09/2015   Procedure: PLACEMENT OF SETON;  Surgeon: Leighton Ruff, MD;  Location: [facility];  Service: General;  Laterality: N/A;   PORT-A-CATH REMOVAL Right 08/17/2014   Procedure: REMOVAL INTRAPERITONEAL CHEMO PORT;  Surgeon: Fanny Skates, MD;  Location: [facility];  Service: General;  Laterality: Right;   PORTACATH PLACEMENT Right 08/17/2014   Procedure:  PLACE NEW PORT A CATH;  Surgeon: Fanny Skates, MD;  Location: [employer] [facility] [employer];  Service: General;  Laterality: Right;   RECTAL BIOPSY N/A 09/09/2015   Procedure: BIOPSY OF RECTOVAGINAL MASS;  Surgeon: Leighton Ruff, MD;  Location: [facility];  Service: General;  Laterality: N/A;   TUBAL LIGATION  YRS AGO   VAGINAL HYSTERECTOMY  1981   fibroids    There were no vitals filed for this visit.    Subjective Assessment - 05/20/21 1333     Subjective States that about 6-8 months ago she had diverticulitis and she lost about 40 pounds and got real weak. Now she is gaining some weight back but still having weakness and mental issues. States that they are looking at some heart tests secondary to some tremors noted in her arms and feet. States she also has swelling in both her legs from knee to the toe. States that she was wearing compression socks but they were too tight as the ridges came up higher so  she stopped wearing them. States that her MD is looking into her kidney and heart function. States that she has back pain because she has fallen so many times as it just hurts and aches. Current pain is 8/10 in her low back, and described as sharp and shooting and occasional pain will shoot up both sides of her back. States she can't walk straight and when she has a spell she gets dizzy if she is up too long or if she turns too quickly.    Pertinent History diverticulitis, hx of ovarian and anal cancer,    Limitations Standing;Walking;House hold activities    How long can you stand comfortably? 15-20 minutes    How long can you walk comfortably? 1 minute without assist    Diagnostic tests xray DDD in spine    Patient Stated  Goals to be able to walk straight, be stronger    Currently in Pain? Yes    Pain Score 8     Pain Location Back    Pain Orientation Lower    Pain Descriptors / Indicators Sharp    Pain Type Chronic pain    Aggravating Factors  standing and walking    Pain Relieving Factors rest                [facility] PT Assessment - 05/20/21 0001       Assessment   Medical Diagnosis weakness    Referring Provider (PT) Susy Frizzle MD      Precautions   Precautions Fall      Balance Screen   Has the patient fallen in the past 6 months Yes    How many times? 4   8 almost falls where her husband has had to catch her   Has the patient had a decrease in activity level because of a fear of falling?  Yes    Is the patient reluctant to leave their home because of a fear of falling?  No      Home Social worker Private residence    Living Arrangements Spouse/significant other    Available Help at Discharge Family    Type of [place] to enter    Entrance Stairs-Number of Steps 2    Entrance Stairs-Rails None    Home Layout Two level;Able to live on main level with bedroom/bathroom    [place] - standard;Grab bars -  tub/shower;Shower seat      Prior Function   Level of Independence Independent with homemaking with ambulation      Cognition   Overall Cognitive Status --   memory and slow to process   Memory Impaired    Problem Solving Impaired      Observation/Other Assessments   Focus on Therapeutic Outcomes (FOTO)  NA      ROM / Strength   AROM / PROM / Strength Strength;AROM      AROM   AROM Assessment Site Lumbar      Strength   Strength Assessment Site Hip;Knee;Ankle    Right/Left Hip Right;Left    Right Hip Flexion 3+/5    Left Hip Flexion 3+/5    Right/Left Knee Right;Left    Right Knee Flexion 3/5    Right Knee Extension 3+/5    Left Knee Flexion 3/5    Left Knee Extension 3+/5    Right/Left Ankle Right;Left      Ambulation/Gait   Ambulation/Gait Yes    Ambulation/Gait Assistance 4: Min guard    Ambulation Distance (Feet) 184 Feet    Assistive device None    Gait Pattern Decreased arm swing - right;Decreased stance time - right;Decreased stride length;Decreased hip/knee flexion - left;Decreased hip/knee flexion - right   leans forward and to the right.                       Objective measurements completed on examination: See above findings.       [facility] Adult PT Treatment/Exercise - 05/20/21 0001       Transfers   Five time sit to stand comments  23 seconds   no UE assist     Ambulation/Gait   Gait Comments 2MW - 3 loss of balance towards right side; ambulation with RW - same distance but no loss of  balance - patient more upright      Exercises   Exercises Lumbar      Lumbar Exercises: Seated   Long Arc Quad on Chair AROM;Strengthening;Both;5 reps   10 seconds holds                   PT Education - 05/20/21 1414     Education Details on use of RW, on thigh high compression garments, on current POC, on HEP and presentation.    Person(s) Educated Patient    Methods Explanation    Comprehension Verbalized understanding               PT Short Term Goals - 05/20/21 1414       PT SHORT TERM GOAL #1   Title Patient will report at least 25% improvement in overall symptoms and/or function to demonstrate improved functional mobility    Time 4    Period Weeks    Status New    Target Date 06/17/21      PT SHORT TERM GOAL #2   Title Patient will be able to ambulate at least 226 feet in 2 minutes with AD if needed    Time 4    Period Weeks    Status New    Target Date 06/17/21      PT SHORT TERM GOAL #3   Title Patient will be independent in self management strategies to improve quality of life and functional outcomes.    Time 4    Period Weeks    Status New    Target Date 06/17/21               PT Long Term Goals - 05/20/21 1416       PT LONG TERM GOAL #1   Title Patient will report being able to stand and walk for at least 10 minutes at a time to demonstrate improved endurance    Time 8    Period Weeks    Status New    Target Date 07/15/21      PT LONG TERM GOAL #2   Title Patient will report no falls in the last 2 weeks to demonstrate reduced incidence of falling.    Time 8    Period Weeks    Status New    Target Date 07/15/21      PT LONG TERM GOAL #3   Title Patient will report at least 50% improvement in overall symptoms and/or function to demonstrate improved functional mobility    Time 8    Period Weeks    Status New    Target Date 07/15/21      PT LONG TERM GOAL #4   Title Patient will be able to perform 5x STS < 16 seconds to demonstrate improved functional strength    Time 8    Period Weeks    Status New    Target Date 07/15/21                    Plan - 05/20/21 1418     Clinical Impression Statement Patient is a 70 y.o. female who presents to physical therapy with complaint of weakness, balance issue and back pain. Patient demonstrates decreased strength, balance deficits and gait abnormalities which are negatively impacting patient ability to perform ADLs and  functional mobility tasks. Patient will benefit from skilled physical therapy services to address these deficits to improve level of function with ADLs, functional mobility tasks, and reduce risk for  falls.    Personal Factors and Comorbidities Comorbidity 1;Comorbidity 2;Comorbidity 3+;Fitness    Comorbidities hx of ovarian and anal cancer, diverticulitis, depression    Examination-Activity Limitations Transfers;Stand;Stairs;Squat;Bend;Carry;Locomotion Level;Lift    Examination-Participation Restrictions Cleaning;Community Activity;Meal Prep;Laundry;Shop    Stability/Clinical Decision Making Evolving/Moderate complexity    Clinical Decision Making Moderate    Rehab Potential Fair    PT Frequency Other (comment)   1-2x/week over 8 weeks for total of 8 visits   PT Duration 8 weeks    PT Treatment/Interventions ADLs/Self Care Home Management;Aquatic Therapy;Cryotherapy;Electrical Stimulation;Balance training;Traction;Moist Heat;Therapeutic exercise;Therapeutic activities;Manual techniques;DME Instruction;Patient/family education;Neuromuscular re-education;Energy conservation    PT Next Visit Plan measure for thigh high compression garments, f/u with RW, perform BERG or DGI, LE strengthening and balance    PT Home Exercise Plan LAQs, standing tall, getting RW    Consulted and Agree with Plan of Care Patient;Family member/caregiver    Family Member Consulted husband Coralyn Mark             Patient will benefit from skilled therapeutic intervention in order to improve the following deficits and impairments:  Pain, Decreased strength, Decreased activity tolerance, Decreased balance, Decreased mobility, Difficulty walking, Postural dysfunction, Improper body mechanics, Dizziness, Decreased range of motion, Decreased knowledge of precautions, Abnormal gait  Visit Diagnosis: Muscle weakness (generalized)  Chronic midline low back pain without sciatica  History of falling     Problem  List Patient Active Problem List   Diagnosis Date Noted   Chronic diarrhea 10/06/2020   Chronic prescription opiate use 10/06/2020   SBO (small bowel obstruction) ([place]) 05/25/2020   Vulvar irritation 03/10/2020   Internal carotid artery stenosis, left    Neural foraminal stenosis of lumbar spine 05/16/2018   Genetic testing 05/24/2017   Vaginal infection 09/27/2016   Essential hypertension 09/27/2016   GERD (gastroesophageal reflux disease) 09/27/2016   Back pain 09/27/2016   Anxiety 09/27/2016   Allergy 09/27/2016   Radiation proctitis 12/23/2015   Anal cancer ([place]) 11/19/2015   History of ovarian cancer 08/03/2015   Prolapse of vaginal vault after hysterectomy 07/20/2015   Port-A-Cath in place 01/13/2015   Nausea with vomiting 10/23/2014   Epithelial ovarian cancer, FIGO stage IIIA ([place]) 07/16/2014   Abdominal carcinomatosis ([place]) 06/18/2014   Bipolar 1 disorder ([place]) 12/27/2011   Kidney stones 12/27/2011   Depression 11/07/2011   2:24 PM, 05/20/21 Jerene Pitch, DPT Physical Therapy with [facility]  [phone] office   [facility] [address] [place], [place], [address] Phone: [phone]   Fax:  [phone]  Name: PHILOMENA BUTTERMORE MRN: [id number] Date of Birth: 11-11-1950

## 2021-05-20 NOTE — Progress Notes (Signed)
Subjective:    Patient ID: Abigail Wagner, female    DOB: [birth year], 70 y.o.   MRN: [id number]  HPI   Patient reports swelling in both legs distal to her knees.  She has +1 to +2 edema in both legs from her knees to her toes.  There is no erythema or warmth or pain.  The swelling comes and goes depending on how long she has been awake and on her feet.  She denies any chest pain shortness of breath dyspnea on exertion orthopnea.  She denies any pleurisy.  She has negative Bevelyn Buckles' sign bilaterally  Of note, the patient has a shaking tremor in her right upper extremity.  When I brought this to her attention she was able to control it and stop it.  When she extends her hands there is a slight high-frequency shaking tremor in both hands that seem to be present with activity.  However the patient has a very flat affect and does demonstrate some bradykinesia as well as an unsteady gait.  I am concerned that she may be developing parkinsonian features.  We discussed this today however I feel that the leg swelling takes priority. Past Medical History:  Diagnosis Date   Allergy    Anal carcinoma [facility]) oncologist-  dr Whitney Muse ([facility])   dx 12/ 2016 [facility] --  chemo and radiation- Dr. Marcello Moores   Anemia    due to her cancer   Anxiety    Dr. Harrington Challenger at [place] (psychiatrist).     Carcinoma of ovary, stage 3 [facility]) oncologist-  dr gehrig/  dr Doreene Eland (cancer center in [place] w/ [facility])--  no recurrency   dx 09/ 2015  Stage IIIB  papillary ovarian carcinoma  s/p  omentectomy and BSO &  chemotherapy (completed 12-07-2014)   Chemotherapy induced nausea and vomiting 10/23/2014   Chronic kidney disease    stones   DDD (degenerative disc disease), lumbosacral    GERD (gastroesophageal reflux disease)    Headache(784.0)    History of acute respiratory failure    05-30-2009  drug overdose-- (intubated for 2 days)  and aspiration pneumonia   History of adenomatous polyp of colon 10/07/2015   tubular adenoma  high grade dysplasia   History of herpes genitalis    History of kidney stones    History of ovarian cancer 08/03/2015   History of suicide attempt    per documentation in epic  05-30-2009  overdose benaodiazepine   HTN (hypertension)    takes Metoprolol daily   Hyperlipidemia    Hypertension 09/27/2016   IBS (irritable bowel syndrome)    takes Bentyl daily   Internal carotid artery stenosis, left    <50%, ulcerative plaque at carotid bulb.    Major depression    Mood disorder (HCC)    OA (osteoarthritis)    both hips   Ovarian cancer ([place])    2015-TAH/BSO   Prolapse of vaginal vault after hysterectomy 07/20/2015   Rectovaginal fistula 08/05/2015   Sigmoid diverticulosis    Smokers' cough [facility])     Past Surgical History:  Procedure Laterality Date   BIOPSY  10/12/2020   Procedure: BIOPSY;  Surgeon: Harvel Quale, MD;  [facility];  Service: Gastroenterology;;   [place]   COLONOSCOPY N/A 10/07/2015   Procedure: COLONOSCOPY;  Surgeon: Rogene Houston, MD;  [facility];  Service: Endoscopy;  Laterality: N/A;  7:30   COLONOSCOPY  WITH PROPOFOL N/A 10/12/2020   Procedure: COLONOSCOPY WITH PROPOFOL;  Surgeon: Harvel Quale, MD;  [facility];  Service: Gastroenterology;  Laterality: N/A;  9:00   ESOPHAGOGASTRODUODENOSCOPY (EGD) WITH PROPOFOL N/A 10/12/2020   Procedure: ESOPHAGOGASTRODUODENOSCOPY (EGD) WITH PROPOFOL;  Surgeon: Harvel Quale, MD;  [facility];  Service: Gastroenterology;  Laterality: N/A;   EXPLORATORY LAPAROTOMY/ OMENTECTOMY/  BILATERAL SALPINGOOPHORECTOMY/  PORT-A-CATH PLACEMENT  07-03-2014   [place]   KNEE ARTHROSCOPY Left 09-22-2004   LAPAROSCOPY N/A 06/02/2014   Procedure: DIAGNOSTIC LAPAROSCOPY, OMENTAL BIOPSY, RIGHT OVARY BIOPSY, LYSIS OF ADHESIONS;  Surgeon: Fanny Skates, MD;  Location: [place];  Service: General;  Laterality: N/A;    MUCOSAL ADVANCEMENT FLAP N/A 06/15/2016   Procedure: EXCISION RECTOVAGINAOL FISTULA WITH MUCOSAL ADVANCEMENT FLAP;  Surgeon: Leighton Ruff, MD;  Location: [place];  Service: General;  Laterality: N/A;   [place] N/A 09/09/2015   Procedure: PLACEMENT OF SETON;  Surgeon: Leighton Ruff, MD;  Location: [facility];  Service: General;  Laterality: N/A;   PORT-A-CATH REMOVAL Right 08/17/2014   Procedure: REMOVAL INTRAPERITONEAL CHEMO PORT;  Surgeon: Fanny Skates, MD;  Location: [place];  Service: General;  Laterality: Right;   PORTACATH PLACEMENT Right 08/17/2014   Procedure:  PLACE NEW PORT A CATH;  Surgeon: Fanny Skates, MD;  Location: [place];  Service: General;  Laterality: Right;   RECTAL BIOPSY N/A 09/09/2015   Procedure: BIOPSY OF RECTOVAGINAL MASS;  Surgeon: Leighton Ruff, MD;  Location: [place];  Service: General;  Laterality: N/A;   TUBAL LIGATION  YRS AGO   VAGINAL HYSTERECTOMY  1981   fibroids   Current Outpatient Medications on File Prior to Visit  Medication Sig Dispense Refill   b complex vitamins tablet Take 1 tablet by mouth daily.     Cholecalciferol (VITAMIN D-3) 125 MCG (5000 UT) TABS Take 5,000 Units by mouth daily.     Coenzyme Q10 (CO Q 10) 100 MG CAPS Take 100 mg by mouth daily.     Collagen-Vitamin C-Biotin (COLLAGEN 1500/C PO) Take by mouth.     divalproex (DEPAKOTE ER) 500 MG 24 hr tablet Take 1 tablet (500 mg total) by mouth daily. 90 tablet 3   escitalopram (LEXAPRO) 20 MG tablet Take 1 tablet (20 mg total) by mouth 2 (two) times daily. 180 tablet 2   HYDROcodone-acetaminophen (NORCO) 5-325 MG tablet Take 1 tablet by mouth every 6 (six) hours as needed for moderate pain. 30 tablet 0   KRILL OIL PO Take 350 mg by mouth daily.     Multiple Vitamin (MULTIVITAMIN) tablet Take 1 tablet by mouth daily.     Multiple Vitamins-Minerals (HAIR SKIN & NAILS ADVANCED PO) Take by  mouth.     omeprazole (PRILOSEC) 40 MG capsule Take 1 capsule (40 mg total) by mouth daily. 90 capsule 3   ondansetron (ZOFRAN) 4 MG tablet TAKE 1 TABLET BY MOUTH EVERY 8 HOURS AS NEEDED FOR NAUSEA AND VOMITING 30 tablet 1   OVER THE COUNTER MEDICATION Take 1 tablet by mouth in the morning and at bedtime. Patient reports that she is taking a ViViscal Tablet by mouth twice daily for hair loss.     polyethylene glycol (MIRALAX / GLYCOLAX) 17 g packet Take 17 g by mouth daily. (Patient taking differently: Take 17 g by mouth daily as needed for moderate constipation.) 30 each 1   rosuvastatin (CRESTOR) 20 MG tablet Take 1 tablet (20  mg total) by mouth daily. 90 tablet 3   No current facility-administered medications on file prior to visit.   Allergies  Allergen Reactions   Morphine And Related     Delirium with sbo   Social History   Socioeconomic History   Marital status: Married    Spouse name: Coralyn Mark   Number of children: 3   Years of education: 12   Highest education level: Not on file  Occupational History   Occupation: retire    Comment: [employer]  Tobacco Use   Smoking status: Every Day    Packs/day: 0.50    Years: 43.00    Pack years: 21.50    Types: Cigarettes   Smokeless tobacco: Never  Vaping Use   Vaping Use: Never used  Substance and Sexual Activity   Alcohol use: No   Drug use: No   Sexual activity: Not Currently    Birth control/protection: Surgical    Comment: hyst  Other Topics Concern   Not on file  Social History Narrative   Lives at home with Coralyn Mark   Retired [employer]   Social Determinants of Health   Financial Resource Strain: Low Risk    Difficulty of Paying Living Expenses: Not very hard  Food Insecurity: Not on file  Transportation Needs: Not on file  Physical Activity: Not on file  Stress: Not on file  Social Connections: Not on file  Intimate Partner Violence: Not on file   Family History  Problem Relation Age of Onset   Bipolar  disorder Mother    Anxiety disorder Mother    Dementia Mother    Depression Mother    Mental illness Mother    Vision loss Mother    Alzheimer's disease Mother    Alcohol abuse Paternal Uncle    Bipolar disorder Maternal Grandmother    Dementia Maternal Grandmother    Alzheimer's disease Maternal Grandmother    Alcohol abuse Paternal Uncle    Stroke Brother    Deep vein thrombosis Son    Heart disease Father    Hyperlipidemia Father    Hypertension Father    Stroke Father    Vision loss Father    Atrial fibrillation Sister    Heart disease Brother    Heart disease Brother    ADD / ADHD Neg Hx    Drug abuse Neg Hx    OCD Neg Hx    Paranoid behavior Neg Hx    Schizophrenia Neg Hx    Seizures Neg Hx    Sexual abuse Neg Hx    Physical abuse Neg Hx       Review of Systems     Objective:   Physical Exam Vitals reviewed.  Constitutional:      General: She is not in acute distress.    Appearance: She is not diaphoretic.  Cardiovascular:     Rate and Rhythm: Normal rate and regular rhythm.     Heart sounds: Normal heart sounds. No murmur heard.   No friction rub. No gallop.  Pulmonary:     Effort: Pulmonary effort is normal. No respiratory distress.     Breath sounds: Normal breath sounds. No stridor. No wheezing or rales.  Abdominal:     General: Abdomen is flat. Bowel sounds are normal. There is no distension.     Palpations: Abdomen is soft. There is no mass.     Tenderness: There is no abdominal tenderness.  Musculoskeletal:     Right upper leg: No deformity.  Left upper leg: No deformity.     Right lower leg: Edema present.     Left lower leg: Edema present.  Neurological:     Mental Status: She is alert.          Assessment & Plan:  Leg swelling - Plan: CBC with Differential/Platelet, Brain natriuretic peptide, COMPLETE METABOLIC PANEL WITH GFR Patient denies eating excessive salt.  I will check a CBC CMP and a BNP.  Obtain an echocardiogram of the  heart.  If lab work is normal, she can use Lasix 40 mg p.o. daily as needed swelling.  Restrict salt.  Obviously if echocardiogram shows significant cardiac abnormalities, additional treatment and testing may be necessary.  Patient has parkinsonian features on her exam.  We will go to monitor this at present however if this continues to progress, I would recommend neurology consultation

## 2021-05-21 LAB — BRAIN NATRIURETIC PEPTIDE: Brain Natriuretic Peptide: 550 pg/mL — ABNORMAL HIGH (ref <100)

## 2021-05-23 ENCOUNTER — Other Ambulatory Visit: Payer: Self-pay | Admitting: *Deleted

## 2021-05-23 ENCOUNTER — Other Ambulatory Visit: Payer: Self-pay

## 2021-05-23 ENCOUNTER — Ambulatory Visit (INDEPENDENT_AMBULATORY_CARE_PROVIDER_SITE_OTHER): Payer: Medicare Other | Admitting: Psychiatry

## 2021-05-23 DIAGNOSIS — R7989 Other specified abnormal findings of blood chemistry: Secondary | ICD-10-CM

## 2021-05-23 DIAGNOSIS — M7989 Other specified soft tissue disorders: Secondary | ICD-10-CM

## 2021-05-23 DIAGNOSIS — F331 Major depressive disorder, recurrent, moderate: Secondary | ICD-10-CM

## 2021-05-23 MED ORDER — FUROSEMIDE 40 MG PO TABS: 40.0000 mg | ORAL_TABLET | Freq: Every day | ORAL | 3 refills | Status: DC

## 2021-05-23 NOTE — Progress Notes (Signed)
Virtual Visit via Telephone Note  I connected with Abigail Wagner on 05/23/21 at 10:06 AM EDT by telephone and verified that I am speaking with the correct person using two identifiers.  Location: Patient: Home Provider: [place] office    I discussed the limitations, risks, security and privacy concerns of performing an evaluation and management service by telephone and the availability of in person appointments. I also discussed with the patient that there may be a patient responsible charge related to this service. The patient expressed understanding and agreed to proceed.   I provided 41 minutes of non-face-to-face time during this encounter.   Abigail Smoker, LCSW    THERAPIST PROGRESS NOTE  Session Time: Monday 05/23/2021 10:06 AM -   10:47 AM      Participation Level: Active  Behavioral Response: Alert, depressed, anxious  Type of Therapy: Individual Therapy  Treatment Goals addressed: Elevate mood and show evidence of usual energy, activities, and socialization level AEB patient exercising 30 minutes/day 4 days/week for 3 consecutive weeks, going out participating in social/recreational activities (going for ride, shopping, going out with daughter) 2 times per week for 3 consecutive weeks.   Interventions: CBT and Supportive  Summary: Abigail Wagner is a 70 y.o. female who is self referred due to worsening symptoms of depression. She currently sees Dr. Harrington Challenger for medication management. She had 1 psychiatric hospitalization due to suicide attempt about 10 years ago. She participated in therapy with this clinician briefly abut 10 years .Pt' husband accompanies her to assessment appointment and is concerned about the amount of medication she is taking. She was falling asleep just sitting up. Since husband took over managing medication, she seems to be doing better but still concerns about medication. Pt reports "Everything that has happened to me physically in one year  (the diverticulitis), things happening in the world really bothers me and I can't control it.  Current symptoms include irritability, fatigue, difficulty concentrating, tearfulness, feelings of hopelessness/worthlessness, decreased appetite, and excessive worry.  Patient last was seen via virtual visit about 2 weeks ago.  She reports experiencing increased symptoms of depression for a 2 to 3-day period after she fell out of her bed and bruised her face.  She reports initially becoming very depressed but then used self talk as well as prayer to cope.  She also used support from her husband to try to determine the cause of her fall and ways to safeguard her room as well as prevent any falls in the future.  Overall, she reports decreased tearfulness and says she has experienced tearfulness about every 2 to 3 days.  She has been doing various activities at home but reports she has not really been doing any pleasurable activities.  She is looking forward to her daughter taking her shopping this weekend.  She expresses some anxiety about this due to difficulty walking as well as concerns about the way she looks due to the bruising from her recent fall.  Patient and husband both express concern about her leg swelling.  She saw her doctor this past week and completed blood work.  They hope to receive results today.   Suicidal/Homicidal: Nowithout intent/plan patient reports having passive suicidal ideations a couple of since last session.  She talked with her husband who helped her work through this.  Patient denies any current suicidal ideations.  Therapist discussed safety issues with patient and husband.  They agree to call 911 or take patient to the ER should  symptoms worsen.  They also have crisis contact information    Therapist Response: Reviewed symptoms, discussed stressors and triggers of increased symptoms of depression, facilitated expression of thoughts and feelings, validated feelings, praised and  reinforced patient's use of helpful coping strategies, continued to discuss the role of behavioral activation in coping with depression, discussed rationale for and provided instructions on how to use daily planning to promote increased involvement in pleasurable activities, also provided instructions on how to use activity menu to identify possible pleasurable activities to pursue, developed plan with patient and her husband to use daily planning forms and bring to next session.   Plan: Return again in 2 weeks.  Diagnosis: Axis I: MDD, moderate        Abigail Smoker, LCSW 05/23/2021

## 2021-05-25 ENCOUNTER — Encounter: Payer: Self-pay | Admitting: Family Medicine

## 2021-05-26 ENCOUNTER — Telehealth (HOSPITAL_COMMUNITY): Payer: Self-pay | Admitting: Physical Therapy

## 2021-05-26 ENCOUNTER — Other Ambulatory Visit: Payer: Self-pay | Admitting: Family Medicine

## 2021-05-26 ENCOUNTER — Encounter (HOSPITAL_COMMUNITY): Payer: Self-pay | Admitting: Physical Therapy

## 2021-05-26 MED ORDER — HYDROCODONE-ACETAMINOPHEN 5-325 MG PO TABS: 1.0000 | ORAL_TABLET | Freq: Four times a day (QID) | ORAL | 0 refills | Status: DC | PRN

## 2021-05-26 NOTE — Telephone Encounter (Signed)
Pt l/m to cx today she is on some medication and she can not come today

## 2021-05-27 ENCOUNTER — Encounter (HOSPITAL_COMMUNITY): Payer: Self-pay | Admitting: Psychiatry

## 2021-05-27 ENCOUNTER — Telehealth (INDEPENDENT_AMBULATORY_CARE_PROVIDER_SITE_OTHER): Payer: Medicare Other | Admitting: Psychiatry

## 2021-05-27 ENCOUNTER — Other Ambulatory Visit: Payer: Self-pay

## 2021-05-27 DIAGNOSIS — F331 Major depressive disorder, recurrent, moderate: Secondary | ICD-10-CM

## 2021-05-27 MED ORDER — FLUOXETINE HCL 20 MG PO CAPS: 20.0000 mg | ORAL_CAPSULE | Freq: Every morning | ORAL | 2 refills | Status: DC

## 2021-05-27 MED ORDER — DIVALPROEX SODIUM ER 500 MG PO TB24: 500.0000 mg | ORAL_TABLET | Freq: Every day | ORAL | 3 refills | Status: DC

## 2021-05-27 MED ORDER — LORAZEPAM 0.5 MG PO TABS: 0.5000 mg | ORAL_TABLET | Freq: Three times a day (TID) | ORAL | 2 refills | Status: DC | PRN

## 2021-05-27 NOTE — Progress Notes (Signed)
Virtual Visit via Telephone Note  I connected with Abigail Wagner on 05/27/21 at 10:20 AM EDT by telephone and verified that I am speaking with the correct person using two identifiers.  Location: Patient: home Provider: home office   I discussed the limitations, risks, security and privacy concerns of performing an evaluation and management service by telephone and the availability of in person appointments. I also discussed with the patient that there may be a patient responsible charge related to this service. The patient expressed understanding and agreed to proceed.      I discussed the assessment and treatment plan with the patient. The patient was provided an opportunity to ask questions and all were answered. The patient agreed with the plan and demonstrated an understanding of the instructions.   The patient was advised to call back or seek an in-person evaluation if the symptoms worsen or if the condition fails to improve as anticipated.  I provided 15 minutes of non-face-to-face time during this encounter.   [name], MD  [facility] MD/PA/NP OP Progress Note  05/27/2021 11:04 AM Abigail Wagner  MRN:  [id number]  Chief Complaint:  Chief Complaint   Depression; Anxiety; Follow-up    HPI: This patient is a 70 year old married white female lives with her husband in [place]. She has 3 children and 5 grandchildren. She is retired from [employer].   The patient states she's had depression since her 18s. She was hospitalized several times, last time being in 2010 when she took a drug overdose. She was going through a lot of family stress back then. She states that she was hospitalized at [facility] and since then she has never wanted to go back. She's been quite stable despite her low doses of medication. She's also stopped drinking alcohol which is made a big difference  The patient has been return after 4 weeks.  In some ways the patient is doing better.  She is eating  fairly well and her weight was 126 at her doctor's visit last week.  She is drinking fluids.  She is having a lot of difficulty with swelling in her legs and is now on Lasix which seems to be helping to some degree.  However she still feels sad and frustrated dealing with all of her medical issues and has had a fair number of crying spells.  Her husband has not seen much improvement in her mood.  We did increase her Lexapro but its not been helpful.  We talked at length today about maybe trying a different antidepressant that is more activating since her energy is so low.  We elected to switch to Prozac.  The low-dose clonazepam is continue to help her anxiety and the Depakote continues to help her mood swings. Visit Diagnosis:    ICD-10-CM   1. Major depressive disorder, recurrent episode, moderate ([place])  F33.1       Past Psychiatric History: Past hospitalizations for depression and alcohol abuse, none since 2010  Past Medical History:  Past Medical History:  Diagnosis Date   Allergy    Anal carcinoma [facility]) oncologist-  dr Whitney Muse ([facility])   dx 12/ 2016 [facility] --  chemo and radiation- Dr. Marcello Moores   Anemia    due to her cancer   Anxiety    Dr. Harrington Challenger at [place] (psychiatrist).     Carcinoma of ovary, stage 3 [facility]) oncologist-  dr gehrig/  dr devoresay (cancer center in [place])--  no recurrency  dx 09/ 2015  Stage IIIB  papillary ovarian carcinoma  s/p  omentectomy and BSO &  chemotherapy (completed 12-07-2014)   Chemotherapy induced nausea and vomiting 10/23/2014   Chronic kidney disease    stones   DDD (degenerative disc disease), lumbosacral    GERD (gastroesophageal reflux disease)    Headache(784.0)    History of acute respiratory failure    05-30-2009  drug overdose-- (intubated for 2 days)  and aspiration pneumonia   History of adenomatous polyp of colon 10/07/2015   tubular adenoma high grade dysplasia   History of herpes genitalis    History of kidney stones     History of ovarian cancer 08/03/2015   History of suicide attempt    per documentation in epic  05-30-2009  overdose benaodiazepine   HTN (hypertension)    takes Metoprolol daily   Hyperlipidemia    Hypertension 09/27/2016   IBS (irritable bowel syndrome)    takes Bentyl daily   Internal carotid artery stenosis, left    <50%, ulcerative plaque at carotid bulb.    Major depression    Mood disorder (HCC)    OA (osteoarthritis)    both hips   Ovarian cancer ([place])    2015-TAH/BSO   Prolapse of vaginal vault after hysterectomy 07/20/2015   Rectovaginal fistula 08/05/2015   Sigmoid diverticulosis    Smokers' cough [facility])     Past Surgical History:  Procedure Laterality Date   BIOPSY  10/12/2020   Procedure: BIOPSY;  Surgeon: Harvel Quale, MD;  [facility];  Service: Gastroenterology;;   [place]   COLONOSCOPY N/A 10/07/2015   Procedure: COLONOSCOPY;  Surgeon: Rogene Houston, MD;  [facility];  Service: Endoscopy;  Laterality: N/A;  7:30   COLONOSCOPY WITH PROPOFOL N/A 10/12/2020   Procedure: COLONOSCOPY WITH PROPOFOL;  Surgeon: Harvel Quale, MD;  [facility];  Service: Gastroenterology;  Laterality: N/A;  9:00   ESOPHAGOGASTRODUODENOSCOPY (EGD) WITH PROPOFOL N/A 10/12/2020   Procedure: ESOPHAGOGASTRODUODENOSCOPY (EGD) WITH PROPOFOL;  Surgeon: Harvel Quale, MD;  [facility];  Service: Gastroenterology;  Laterality: N/A;   EXPLORATORY LAPAROTOMY/ OMENTECTOMY/  BILATERAL SALPINGOOPHORECTOMY/  PORT-A-CATH PLACEMENT  07-03-2014   [place]   KNEE ARTHROSCOPY Left 09-22-2004   LAPAROSCOPY N/A 06/02/2014   Procedure: DIAGNOSTIC LAPAROSCOPY, OMENTAL BIOPSY, RIGHT OVARY BIOPSY, LYSIS OF ADHESIONS;  Surgeon: Fanny Skates, MD;  Location: [place] [facility];  Service: General;  Laterality: N/A;   MUCOSAL ADVANCEMENT FLAP N/A 06/15/2016   Procedure: EXCISION RECTOVAGINAOL FISTULA  WITH MUCOSAL ADVANCEMENT FLAP;  Surgeon: Leighton Ruff, MD;  Location: [place];  Service: General;  Laterality: N/A;   [place] N/A 09/09/2015   Procedure: PLACEMENT OF SETON;  Surgeon: Leighton Ruff, MD;  Location[facility];  Service: General;  Laterality: N/A;   PORT-A-CATH REMOVAL Right 08/17/2014   Procedure: REMOVAL INTRAPERITONEAL CHEMO PORT;  Surgeon: Fanny Skates, MD;  Location: [place];  Service: General;  Laterality: Right;   PORTACATH PLACEMENT Right 08/17/2014   Procedure:  PLACE NEW PORT A CATH;  Surgeon: Fanny Skates, MD;  Location: [place];  Service: General;  Laterality: Right;   RECTAL BIOPSY N/A 09/09/2015   Procedure: BIOPSY OF RECTOVAGINAL MASS;  Surgeon: Leighton Ruff, MD;  Location: [place];  Service: General;  Laterality: N/A;   [place]  fibroids    Family Psychiatric History:see below  Family History:  Family History  Problem Relation Age of Onset   Bipolar disorder Mother    Anxiety disorder Mother    Dementia Mother    Depression Mother    Mental illness Mother    Vision loss Mother    Alzheimer's disease Mother    Alcohol abuse Paternal Uncle    Bipolar disorder Maternal Grandmother    Dementia Maternal Grandmother    Alzheimer's disease Maternal Grandmother    Alcohol abuse Paternal Uncle    Stroke Brother    Deep vein thrombosis Son    Heart disease Father    Hyperlipidemia Father    Hypertension Father    Stroke Father    Vision loss Father    Atrial fibrillation Sister    Heart disease Brother    Heart disease Brother    ADD / ADHD Neg Hx    Drug abuse Neg Hx    OCD Neg Hx    Paranoid behavior Neg Hx    Schizophrenia Neg Hx    Seizures Neg Hx    Sexual abuse Neg Hx    Physical abuse Neg Hx     Social History:  Social History   Socioeconomic History   Marital status: Married     Spouse name: Coralyn Mark   Number of children: 3   Years of education: 12   Highest education level: Not on file  Occupational History   Occupation: retire    Comment: [employer]  Tobacco Use   Smoking status: Every Day    Packs/day: 0.50    Years: 43.00    Pack years: 21.50    Types: Cigarettes   Smokeless tobacco: Never  Vaping Use   Vaping Use: Never used  Substance and Sexual Activity   Alcohol use: No   Drug use: No   Sexual activity: Not Currently    Birth control/protection: Surgical    Comment: hyst  Other Topics Concern   Not on file  Social History Narrative   Lives at home with Coralyn Mark   Retired [employer]   Social Determinants of Health   Financial Resource Strain: Low Risk    Difficulty of Paying Living Expenses: Not very hard  Food Insecurity: Not on file  Transportation Needs: Not on file  Physical Activity: Not on file  Stress: Not on file  Social Connections: Not on file    Allergies:  Allergies  Allergen Reactions   Morphine And Related     Delirium with sbo    Metabolic Disorder Labs: No results found for: HGBA1C, MPG No results found for: PROLACTIN Lab Results  Component Value Date   CHOL 152 01/10/2021   TRIG 56 01/10/2021   HDL 58 01/10/2021   CHOLHDL 2.6 01/10/2021   VLDL 28 01/10/2017   LDLCALC 80 01/10/2021   LDLCALC 69 04/08/2020     Therapeutic Level Labs: No results found for: LITHIUM No results found for: VALPROATE No components found for:  CBMZ  Current Medications: Current Outpatient Medications  Medication Sig Dispense Refill   FLUoxetine (PROZAC) 20 MG capsule Take 1 capsule (20 mg total) by mouth every morning. 30 capsule 2   LORazepam (ATIVAN) 0.5 MG tablet Take 1 tablet (0.5 mg total) by mouth 3 (three) times daily as needed for anxiety. 90 tablet 2   b complex vitamins tablet Take 1 tablet by mouth daily.     Cholecalciferol (VITAMIN D-3) 125 MCG (5000 UT) TABS Take  5,000 Units by mouth daily.     Coenzyme Q10 (CO Q  10) 100 MG CAPS Take 100 mg by mouth daily.     Collagen-Vitamin C-Biotin (COLLAGEN 1500/C PO) Take by mouth.     divalproex (DEPAKOTE ER) 500 MG 24 hr tablet Take 1 tablet (500 mg total) by mouth daily. 90 tablet 3   furosemide (LASIX) 40 MG tablet Take 1 tablet (40 mg total) by mouth daily. 30 tablet 3   HYDROcodone-acetaminophen (NORCO) 5-325 MG tablet Take 1 tablet by mouth every 6 (six) hours as needed for moderate pain. 30 tablet 0   KRILL OIL PO Take 350 mg by mouth daily.     Multiple Vitamin (MULTIVITAMIN) tablet Take 1 tablet by mouth daily.     Multiple Vitamins-Minerals (HAIR SKIN & NAILS ADVANCED PO) Take by mouth.     omeprazole (PRILOSEC) 40 MG capsule Take 1 capsule (40 mg total) by mouth daily. 90 capsule 3   ondansetron (ZOFRAN) 4 MG tablet TAKE 1 TABLET BY MOUTH EVERY 8 HOURS AS NEEDED FOR NAUSEA AND VOMITING 30 tablet 1   OVER THE COUNTER MEDICATION Take 1 tablet by mouth in the morning and at bedtime. Patient reports that she is taking a ViViscal Tablet by mouth twice daily for hair loss.     polyethylene glycol (MIRALAX / GLYCOLAX) 17 g packet Take 17 g by mouth daily. (Patient taking differently: Take 17 g by mouth daily as needed for moderate constipation.) 30 each 1   rosuvastatin (CRESTOR) 20 MG tablet Take 1 tablet (20 mg total) by mouth daily. 90 tablet 3   No current facility-administered medications for this visit.     Musculoskeletal: Strength & Muscle Tone: na Gait & Station: na Patient leans: na  Psychiatric Specialty Exam: Review of Systems  Constitutional:  Positive for fatigue.  Cardiovascular:  Positive for leg swelling.  Psychiatric/Behavioral:  Positive for dysphoric mood. The patient is nervous/anxious.   All other systems reviewed and are negative.  There were no vitals taken for this visit.There is no height or weight on file to calculate BMI.  General Appearance: NA  Eye Contact:  NA  Speech:  Clear and Coherent  Volume:  Normal  Mood:   Anxious and Dysphoric  Affect:  NA  Thought Process:  Goal Directed  Orientation:  Full (Time, Place, and Person)  Thought Content: Rumination   Suicidal Thoughts:  No  Homicidal Thoughts:  No  Memory:  Immediate;   Good Recent;   Fair Remote;   Fair  Judgement:  Fair  Insight:  Fair  Psychomotor Activity:  Decreased and Tremor  Concentration:  Concentration: Fair and Attention Span: Fair  Recall:  [employer] of Knowledge: Good  Language: Good  Akathisia:  No  Handed:  Right  AIMS (if indicated): not done  Assets:  Communication Skills Desire for Improvement Resilience Social Support  ADL's:  Intact  Cognition: WNL  Sleep:  Good   Screenings: GAD-7    Flowsheet Row Clinical Support from 01/10/2021 in [place]  Total GAD-7 Score 8      PHQ2-9    Flowsheet Row Video Visit from 05/27/2021 in [place] [facility] from 05/09/2021 in [place] [facility] Video Visit from 04/29/2021 in [place] [facility] from 04/11/2021 in [place] [facility] Video Visit from 02/07/2021 in [place] [facility]  PHQ-2 Total Score '1 3 3 6 1  '$ PHQ-9 Total  Score '3 10 10 21 '$ --      Flowsheet Row Video Visit from 05/27/2021 in [place] [facility] from 05/09/2021 in [place] Video Visit from 04/29/2021 in [place] [facility]  C-SSRS RISK CATEGORY Error: Question 6 not populated Low Risk Error: Q7 should not be populated when Q6 is No        Assessment and Plan: This patient is a 70 year old female with a history of depression and anxiety.  She has had numerous medical problems recently including diverticulitis which caused weight loss weakness and fatigue.  She now has leg  swelling which has been very frustrating for her.  She seems to be getting more depressed.  We will therefore discontinue and taper off Lexapro and start Prozac 20 mg daily.  She will continue Ativan 0.5 mg 3 times daily only as needed and Depakote ER 500 mg at bedtime for mood stabilization.  She will return to see me in 4 weeks   [name], MD 05/27/2021, 11:04 AM

## 2021-06-01 ENCOUNTER — Ambulatory Visit (HOSPITAL_COMMUNITY): Payer: Medicare Other | Attending: Family Medicine

## 2021-06-01 ENCOUNTER — Telehealth (HOSPITAL_COMMUNITY): Payer: Self-pay

## 2021-06-01 NOTE — Telephone Encounter (Signed)
No show, called and left message concerning missed apt today.  Reminded next apt date and time with contact number included if needs to cancel/reschedule future apts.  Ihor Austin, LPTA/CLT; Delana Meyer [phone]

## 2021-06-02 ENCOUNTER — Ambulatory Visit (HOSPITAL_COMMUNITY)
Admission: RE | Admit: 2021-06-02 | Discharge: 2021-06-02 | Disposition: A | Discharge status: Home / Self Care | Payer: Medicare Other | Source: Ambulatory Visit | Attending: Family Medicine | Admitting: Family Medicine

## 2021-06-02 ENCOUNTER — Other Ambulatory Visit: Payer: Self-pay

## 2021-06-02 DIAGNOSIS — R531 Weakness: Secondary | ICD-10-CM

## 2021-06-02 DIAGNOSIS — Z85048 Personal history of other malignant neoplasm of rectum, rectosigmoid junction, and anus: Secondary | ICD-10-CM

## 2021-06-02 DIAGNOSIS — R634 Abnormal weight loss: Secondary | ICD-10-CM | POA: Diagnosis not present

## 2021-06-02 DIAGNOSIS — I700 Atherosclerosis of aorta: Secondary | ICD-10-CM | POA: Diagnosis not present

## 2021-06-02 DIAGNOSIS — K314 Gastric diverticulum: Secondary | ICD-10-CM | POA: Diagnosis not present

## 2021-06-02 DIAGNOSIS — N8110 Cystocele, unspecified: Secondary | ICD-10-CM | POA: Diagnosis not present

## 2021-06-02 MED ORDER — IOHEXOL 350 MG/ML SOLN
75.0000 mL | Freq: Once | INTRAVENOUS | Status: AC | PRN
  Administered 2021-06-02: 75 mL via INTRAVENOUS

## 2021-06-03 ENCOUNTER — Other Ambulatory Visit: Payer: Medicare Other

## 2021-06-03 ENCOUNTER — Telehealth: Payer: Self-pay | Admitting: Pharmacist

## 2021-06-03 DIAGNOSIS — R7989 Other specified abnormal findings of blood chemistry: Secondary | ICD-10-CM | POA: Diagnosis not present

## 2021-06-03 DIAGNOSIS — R600 Localized edema: Secondary | ICD-10-CM | POA: Diagnosis not present

## 2021-06-03 DIAGNOSIS — M7989 Other specified soft tissue disorders: Secondary | ICD-10-CM

## 2021-06-03 LAB — BASIC METABOLIC PANEL WITH GFR
BUN: 24 mg/dL (ref 7–25)
CO2: 28 mmol/L (ref 20–32)
Calcium: 9.6 mg/dL (ref 8.6–10.4)
Chloride: 104 mmol/L (ref 98–110)
Creat: 0.94 mg/dL (ref 0.60–1.00)
Glucose, Bld: 65 mg/dL (ref 65–99)
Potassium: 4 mmol/L (ref 3.5–5.3)
Sodium: 142 mmol/L (ref 135–146)
eGFR: 65 mL/min/{1.73_m2} (ref >=60)

## 2021-06-03 NOTE — Progress Notes (Addendum)
Chronic Care Management Pharmacy Assistant   Name: Abigail Wagner  MRN: [id number] DOB: Aug 17, 1951  Reason for Encounter: Disease State For HTN.    Conditions to be addressed/monitored: HTN, Migraines, GERD, Depression/Bipolar, HLD  Recent office visits:  05/20/21 Dr. Dennard Schaumann For edema/Leg swellng.STOPPED Cyanocobalamin, Docusate and [place].   Recent consult visits:  05/27/21 [name], MD. For major depressive disorder.  04/29/21 Behavioral [name], MD. For major depressive disorder.  04/28/21 Oncology [name], NP. For follow-up. STOPPED Losartan and Mirtazapine.   Hospital visits:  None since 04/13/21  Medications: Outpatient Encounter Medications as of 06/03/2021  Medication Sig Note   b complex vitamins tablet Take 1 tablet by mouth daily.    Cholecalciferol (VITAMIN D-3) 125 MCG (5000 UT) TABS Take 5,000 Units by mouth daily.    Coenzyme Q10 (CO Q 10) 100 MG CAPS Take 100 mg by mouth daily.    Collagen-Vitamin C-Biotin (COLLAGEN 1500/C PO) Take by mouth.    divalproex (DEPAKOTE ER) 500 MG 24 hr tablet Take 1 tablet (500 mg total) by mouth daily.    FLUoxetine (PROZAC) 20 MG capsule Take 1 capsule (20 mg total) by mouth every morning.    furosemide (LASIX) 40 MG tablet Take 1 tablet (40 mg total) by mouth daily.    HYDROcodone-acetaminophen (NORCO) 5-325 MG tablet Take 1 tablet by mouth every 6 (six) hours as needed for moderate pain.    KRILL OIL PO Take 350 mg by mouth daily.    LORazepam (ATIVAN) 0.5 MG tablet Take 1 tablet (0.5 mg total) by mouth 3 (three) times daily as needed for anxiety.    Multiple Vitamin (MULTIVITAMIN) tablet Take 1 tablet by mouth daily.    Multiple Vitamins-Minerals (HAIR SKIN & NAILS ADVANCED PO) Take by mouth.    omeprazole (PRILOSEC) 40 MG capsule Take 1 capsule (40 mg total) by mouth daily.    ondansetron (ZOFRAN) 4 MG tablet TAKE 1 TABLET BY MOUTH EVERY 8 HOURS AS NEEDED FOR NAUSEA AND VOMITING    OVER THE  COUNTER MEDICATION Take 1 tablet by mouth in the morning and at bedtime. Patient reports that she is taking a ViViscal Tablet by mouth twice daily for hair loss.    polyethylene glycol (MIRALAX / GLYCOLAX) 17 g packet Take 17 g by mouth daily. (Patient taking differently: Take 17 g by mouth daily as needed for moderate constipation.) 10/06/2020: Patient and Husband report that she takes as needed.   rosuvastatin (CRESTOR) 20 MG tablet Take 1 tablet (20 mg total) by mouth daily.    No facility-administered encounter medications on file as of 06/03/2021.   Reviewed chart prior to disease state call. Spoke with patient regarding BP  Recent Office Vitals: BP Readings from Last 3 Encounters:  05/20/21 126/80  04/28/21 (!) 95/43  04/25/21 129/66   Pulse Readings from Last 3 Encounters:  05/20/21 60  04/28/21 69  04/25/21 71    Wt Readings from Last 3 Encounters:  05/20/21 124 lb (56.2 kg)  04/28/21 116 lb 6.4 oz (52.8 kg)  04/12/21 116 lb 6.4 oz (52.8 kg)     Kidney Function Lab Results  Component Value Date/Time   CREATININE 0.88 05/20/2021 09:40 AM   CREATININE 0.77 03/07/2021 03:57 PM   GFRNONAA 78 03/07/2021 03:57 PM   GFRAA 91 03/07/2021 03:57 PM    BMP Latest Ref Rng & Units 05/20/2021 03/07/2021 01/10/2021  Glucose 65 - 99 mg/dL 65 58(L) 76  BUN 7 -  25 mg/dL 27(H) 22 22  Creatinine 0.60 - 1.00 mg/dL 0.88 0.77 0.89  BUN/Creat Ratio 6 - 22 (calc) [id number]) NOT APPLICABLE NOT APPLICABLE  Sodium A999333 - 146 mmol/L 145 144 142  Potassium 3.5 - 5.3 mmol/L 4.4 4.9 4.7  Chloride 98 - 110 mmol/L 110 109 105  CO2 20 - 32 mmol/L '26 27 27  '$ Calcium 8.6 - 10.4 mg/dL 8.8 9.0 9.4    Current antihypertensive regimen:  Losartan '50mg'$  daily  How often are you checking your Blood Pressure? Patient stated daily  Current home BP readings: Patients husband stated her blood pressure has been within normal range, he stated her blood pressure has been in the low range sometimes but Dr. Dennard Schaumann seems to  believes its okay.   What recent interventions/DTPs have been made by any provider to improve Blood Pressure control since last CPP Visit: None.   Any recent hospitalizations or ED visits since last visit with CPP? Patient stated no.  What diet changes have been made to improve Blood Pressure Control?  Patients husband stated not much has changed from her diet.   What exercise is being done to improve your Blood Pressure Control?  Patients husband stated she is pretty active.   Adherence Review: Is the patient currently on ACE/ARB medication? N/A.   Does the patient have >5 day gap between last estimated fill dates? N/A.  Patients husband stated she is drinking ensure and boost and has not gained any weight. He stated she lost 8 pounds since starting lasix.  Care Gaps: Not on the list.   Star Rating Drugs: Rosuvastatin 20 mg 05/04/21 90 DS.   Follow-Up:Pharmacist Review   [name], RMA Clinical Pharmacist Assistant [phone]  10 minutes spent in review, coordination, and documentation.  Reviewed by: [name], PharmD Clinical Pharmacist [phone]

## 2021-06-04 LAB — BRAIN NATRIURETIC PEPTIDE: Brain Natriuretic Peptide: 379 pg/mL — ABNORMAL HIGH (ref <100)

## 2021-06-08 ENCOUNTER — Encounter (HOSPITAL_COMMUNITY): Payer: Medicare Other

## 2021-06-08 ENCOUNTER — Encounter: Payer: Self-pay | Admitting: Family Medicine

## 2021-06-08 NOTE — Telephone Encounter (Signed)
Ok to refill??  Last office visit 05/20/2021.  Last refill 05/26/2021.

## 2021-06-09 MED ORDER — HYDROCODONE-ACETAMINOPHEN 5-325 MG PO TABS: 1.0000 | ORAL_TABLET | Freq: Four times a day (QID) | ORAL | 0 refills | Status: DC | PRN

## 2021-06-14 ENCOUNTER — Other Ambulatory Visit: Payer: Self-pay

## 2021-06-14 ENCOUNTER — Ambulatory Visit (HOSPITAL_COMMUNITY): Payer: Medicare Other | Attending: Cardiology

## 2021-06-14 DIAGNOSIS — I129 Hypertensive chronic kidney disease with stage 1 through stage 4 chronic kidney disease, or unspecified chronic kidney disease: Secondary | ICD-10-CM | POA: Insufficient documentation

## 2021-06-14 DIAGNOSIS — E785 Hyperlipidemia, unspecified: Secondary | ICD-10-CM | POA: Diagnosis not present

## 2021-06-14 DIAGNOSIS — M7989 Other specified soft tissue disorders: Secondary | ICD-10-CM | POA: Diagnosis not present

## 2021-06-14 DIAGNOSIS — R0600 Dyspnea, unspecified: Secondary | ICD-10-CM | POA: Diagnosis not present

## 2021-06-14 DIAGNOSIS — R600 Localized edema: Secondary | ICD-10-CM

## 2021-06-14 DIAGNOSIS — I071 Rheumatic tricuspid insufficiency: Secondary | ICD-10-CM | POA: Insufficient documentation

## 2021-06-14 DIAGNOSIS — N189 Chronic kidney disease, unspecified: Secondary | ICD-10-CM | POA: Insufficient documentation

## 2021-06-14 LAB — ECHOCARDIOGRAM COMPLETE
Area-P 1/2: 3.17 cm2
S' Lateral: 2.6 cm

## 2021-06-15 ENCOUNTER — Encounter (HOSPITAL_COMMUNITY): Payer: Medicare Other | Admitting: Physical Therapy

## 2021-06-17 ENCOUNTER — Other Ambulatory Visit: Payer: Self-pay | Admitting: *Deleted

## 2021-06-17 DIAGNOSIS — I5189 Other ill-defined heart diseases: Secondary | ICD-10-CM

## 2021-06-17 DIAGNOSIS — I5083 High output heart failure: Secondary | ICD-10-CM

## 2021-06-21 ENCOUNTER — Telehealth (HOSPITAL_COMMUNITY): Payer: Self-pay

## 2021-06-21 NOTE — Telephone Encounter (Signed)
Pt l/m to cx today no reason given

## 2021-06-22 ENCOUNTER — Encounter: Payer: Self-pay | Admitting: Family Medicine

## 2021-06-22 ENCOUNTER — Ambulatory Visit (HOSPITAL_COMMUNITY): Payer: Medicare Other

## 2021-06-27 ENCOUNTER — Other Ambulatory Visit: Payer: Self-pay | Admitting: Family Medicine

## 2021-06-27 NOTE — Telephone Encounter (Signed)
Ok to refill??  Last office visit 05/20/2021.  Last refill 06/09/2021.

## 2021-06-28 ENCOUNTER — Emergency Department (HOSPITAL_COMMUNITY): Payer: Medicare Other

## 2021-06-28 ENCOUNTER — Other Ambulatory Visit: Payer: Self-pay

## 2021-06-28 ENCOUNTER — Ambulatory Visit (INDEPENDENT_AMBULATORY_CARE_PROVIDER_SITE_OTHER): Payer: Medicare Other | Admitting: Family Medicine

## 2021-06-28 ENCOUNTER — Telehealth (HOSPITAL_COMMUNITY): Payer: Medicare Other | Admitting: Psychiatry

## 2021-06-28 ENCOUNTER — Encounter: Payer: Self-pay | Admitting: Family Medicine

## 2021-06-28 ENCOUNTER — Encounter (HOSPITAL_COMMUNITY): Payer: Self-pay

## 2021-06-28 ENCOUNTER — Inpatient Hospital Stay (HOSPITAL_COMMUNITY)
Admission: EM | Admit: 2021-06-28 | Discharge: 2021-06-30 | DRG: 309 | Disposition: A | Discharge status: Home / Self Care | Payer: Medicare Other | Attending: Family Medicine | Admitting: Family Medicine

## 2021-06-28 VITALS — BP 100/58 | HR 102 | Temp 98.9°F | Resp 14 | Ht 65.0 in | Wt 124.0 lb

## 2021-06-28 DIAGNOSIS — R55 Syncope and collapse: Secondary | ICD-10-CM | POA: Diagnosis not present

## 2021-06-28 DIAGNOSIS — I6523 Occlusion and stenosis of bilateral carotid arteries: Secondary | ICD-10-CM | POA: Diagnosis not present

## 2021-06-28 DIAGNOSIS — Z79891 Long term (current) use of opiate analgesic: Secondary | ICD-10-CM

## 2021-06-28 DIAGNOSIS — R269 Unspecified abnormalities of gait and mobility: Secondary | ICD-10-CM | POA: Diagnosis not present

## 2021-06-28 DIAGNOSIS — F17210 Nicotine dependence, cigarettes, uncomplicated: Secondary | ICD-10-CM | POA: Diagnosis present

## 2021-06-28 DIAGNOSIS — Z885 Allergy status to narcotic agent status: Secondary | ICD-10-CM

## 2021-06-28 DIAGNOSIS — J219 Acute bronchiolitis, unspecified: Secondary | ICD-10-CM | POA: Diagnosis not present

## 2021-06-28 DIAGNOSIS — Z85048 Personal history of other malignant neoplasm of rectum, rectosigmoid junction, and anus: Secondary | ICD-10-CM

## 2021-06-28 DIAGNOSIS — K529 Noninfective gastroenteritis and colitis, unspecified: Secondary | ICD-10-CM | POA: Diagnosis present

## 2021-06-28 DIAGNOSIS — Z6820 Body mass index (BMI) 20.0-20.9, adult: Secondary | ICD-10-CM

## 2021-06-28 DIAGNOSIS — I4891 Unspecified atrial fibrillation: Secondary | ICD-10-CM

## 2021-06-28 DIAGNOSIS — Z8543 Personal history of malignant neoplasm of ovary: Secondary | ICD-10-CM

## 2021-06-28 DIAGNOSIS — R634 Abnormal weight loss: Secondary | ICD-10-CM | POA: Diagnosis not present

## 2021-06-28 DIAGNOSIS — C569 Malignant neoplasm of unspecified ovary: Secondary | ICD-10-CM | POA: Diagnosis present

## 2021-06-28 DIAGNOSIS — Z923 Personal history of irradiation: Secondary | ICD-10-CM | POA: Diagnosis not present

## 2021-06-28 DIAGNOSIS — R5383 Other fatigue: Secondary | ICD-10-CM | POA: Diagnosis not present

## 2021-06-28 DIAGNOSIS — R4182 Altered mental status, unspecified: Secondary | ICD-10-CM | POA: Diagnosis not present

## 2021-06-28 DIAGNOSIS — I4819 Other persistent atrial fibrillation: Secondary | ICD-10-CM | POA: Diagnosis not present

## 2021-06-28 DIAGNOSIS — F319 Bipolar disorder, unspecified: Secondary | ICD-10-CM | POA: Diagnosis present

## 2021-06-28 DIAGNOSIS — R000 Tachycardia, unspecified: Secondary | ICD-10-CM | POA: Diagnosis not present

## 2021-06-28 DIAGNOSIS — Z9221 Personal history of antineoplastic chemotherapy: Secondary | ICD-10-CM

## 2021-06-28 DIAGNOSIS — Z818 Family history of other mental and behavioral disorders: Secondary | ICD-10-CM | POA: Diagnosis not present

## 2021-06-28 DIAGNOSIS — I499 Cardiac arrhythmia, unspecified: Secondary | ICD-10-CM

## 2021-06-28 DIAGNOSIS — R630 Anorexia: Secondary | ICD-10-CM | POA: Diagnosis present

## 2021-06-28 DIAGNOSIS — F419 Anxiety disorder, unspecified: Secondary | ICD-10-CM | POA: Diagnosis present

## 2021-06-28 DIAGNOSIS — I959 Hypotension, unspecified: Secondary | ICD-10-CM | POA: Diagnosis present

## 2021-06-28 DIAGNOSIS — I6522 Occlusion and stenosis of left carotid artery: Secondary | ICD-10-CM | POA: Diagnosis present

## 2021-06-28 DIAGNOSIS — I358 Other nonrheumatic aortic valve disorders: Secondary | ICD-10-CM | POA: Diagnosis not present

## 2021-06-28 DIAGNOSIS — K219 Gastro-esophageal reflux disease without esophagitis: Secondary | ICD-10-CM | POA: Diagnosis present

## 2021-06-28 DIAGNOSIS — Z79899 Other long term (current) drug therapy: Secondary | ICD-10-CM | POA: Diagnosis not present

## 2021-06-28 DIAGNOSIS — E860 Dehydration: Secondary | ICD-10-CM | POA: Diagnosis not present

## 2021-06-28 DIAGNOSIS — Z20822 Contact with and (suspected) exposure to covid-19: Secondary | ICD-10-CM | POA: Diagnosis present

## 2021-06-28 DIAGNOSIS — E785 Hyperlipidemia, unspecified: Secondary | ICD-10-CM | POA: Diagnosis present

## 2021-06-28 DIAGNOSIS — Z8249 Family history of ischemic heart disease and other diseases of the circulatory system: Secondary | ICD-10-CM

## 2021-06-28 DIAGNOSIS — N179 Acute kidney failure, unspecified: Secondary | ICD-10-CM | POA: Diagnosis not present

## 2021-06-28 DIAGNOSIS — I10 Essential (primary) hypertension: Secondary | ICD-10-CM | POA: Diagnosis present

## 2021-06-28 DIAGNOSIS — K5792 Diverticulitis of intestine, part unspecified, without perforation or abscess without bleeding: Secondary | ICD-10-CM | POA: Diagnosis present

## 2021-06-28 DIAGNOSIS — I480 Paroxysmal atrial fibrillation: Secondary | ICD-10-CM

## 2021-06-28 DIAGNOSIS — R29818 Other symptoms and signs involving the nervous system: Secondary | ICD-10-CM | POA: Diagnosis not present

## 2021-06-28 DIAGNOSIS — K5732 Diverticulitis of large intestine without perforation or abscess without bleeding: Secondary | ICD-10-CM | POA: Diagnosis present

## 2021-06-28 HISTORY — DX: Disorder of arteries and arterioles, unspecified: I77.9

## 2021-06-28 HISTORY — DX: Other seasonal allergic rhinitis: J30.2

## 2021-06-28 LAB — CBC
HCT: 36.2 % (ref 36.0–46.0)
Hemoglobin: 11.9 g/dL — ABNORMAL LOW (ref 12.0–15.0)
MCH: 32.2 pg (ref 26.0–34.0)
MCHC: 32.9 g/dL (ref 30.0–36.0)
MCV: 97.8 fL (ref 80.0–100.0)
Platelets: 185 10*3/uL (ref 150–400)
RBC: 3.7 MIL/uL — ABNORMAL LOW (ref 3.87–5.11)
RDW: 16.7 % — ABNORMAL HIGH (ref 11.5–15.5)
WBC: 3.7 10*3/uL — ABNORMAL LOW (ref 4.0–10.5)
nRBC: 0 % (ref 0.0–0.2)

## 2021-06-28 LAB — BASIC METABOLIC PANEL
Anion gap: 11 (ref 5–15)
BUN: 26 mg/dL — ABNORMAL HIGH (ref 8–23)
CO2: 25 mmol/L (ref 22–32)
Calcium: 8.7 mg/dL — ABNORMAL LOW (ref 8.9–10.3)
Chloride: 102 mmol/L (ref 98–111)
Creatinine, Ser: 1.1 mg/dL — ABNORMAL HIGH (ref 0.44–1.00)
GFR, Estimated: 54 mL/min — ABNORMAL LOW (ref >60)
Glucose, Bld: 97 mg/dL (ref 70–99)
Potassium: 3.8 mmol/L (ref 3.5–5.1)
Sodium: 138 mmol/L (ref 135–145)

## 2021-06-28 LAB — HEPATIC FUNCTION PANEL
ALT: 14 U/L (ref 0–44)
AST: 27 U/L (ref 15–41)
Albumin: 3.9 g/dL (ref 3.5–5.0)
Alkaline Phosphatase: 43 U/L (ref 38–126)
Bilirubin, Direct: <0.1 mg/dL (ref 0.0–0.2)
Total Bilirubin: 0.7 mg/dL (ref 0.3–1.2)
Total Protein: 6.6 g/dL (ref 6.5–8.1)

## 2021-06-28 LAB — DIFFERENTIAL
Abs Immature Granulocytes: 0.03 10*3/uL (ref 0.00–0.07)
Basophils Absolute: 0 10*3/uL (ref 0.0–0.1)
Basophils Relative: 1 %
Eosinophils Absolute: 0.1 10*3/uL (ref 0.0–0.5)
Eosinophils Relative: 2 %
Immature Granulocytes: 1 %
Lymphocytes Relative: 30 %
Lymphs Abs: 1.1 10*3/uL (ref 0.7–4.0)
Monocytes Absolute: 0.3 10*3/uL (ref 0.1–1.0)
Monocytes Relative: 8 %
Neutro Abs: 2.2 10*3/uL (ref 1.7–7.7)
Neutrophils Relative %: 58 %

## 2021-06-28 LAB — HIV ANTIBODY (ROUTINE TESTING W REFLEX): HIV Screen 4th Generation wRfx: NONREACTIVE

## 2021-06-28 LAB — RESP PANEL BY RT-PCR (FLU A&B, COVID) ARPGX2
Influenza A by PCR: NEGATIVE
Influenza B by PCR: NEGATIVE
SARS Coronavirus 2 by RT PCR: NEGATIVE

## 2021-06-28 MED ORDER — SODIUM CHLORIDE 0.9 % IV BOLUS
500.0000 mL | Freq: Once | INTRAVENOUS | Status: AC
  Administered 2021-06-28: 500 mL via INTRAVENOUS

## 2021-06-28 MED ORDER — APIXABAN 5 MG PO TABS
5.0000 mg | ORAL_TABLET | Freq: Two times a day (BID) | ORAL | Status: DC
  Administered 2021-06-28 – 2021-06-30 (×5): 5 mg via ORAL
  Filled 2021-06-28 (×5): qty 1

## 2021-06-28 MED ORDER — ACETAMINOPHEN 325 MG PO TABS
650.0000 mg | ORAL_TABLET | ORAL | Status: DC | PRN
  Administered 2021-06-30: 650 mg via ORAL
  Filled 2021-06-28: qty 2

## 2021-06-28 MED ORDER — SODIUM CHLORIDE 0.9 % IV SOLN: INTRAVENOUS | Status: DC

## 2021-06-28 MED ORDER — DIVALPROEX SODIUM ER 500 MG PO TB24
500.0000 mg | ORAL_TABLET | Freq: Every day | ORAL | Status: DC
  Administered 2021-06-29 – 2021-06-30 (×2): 500 mg via ORAL
  Filled 2021-06-28 (×2): qty 1

## 2021-06-28 MED ORDER — POLYETHYLENE GLYCOL 3350 17 G PO PACK: 17.0000 g | PACK | Freq: Every day | ORAL | Status: DC | PRN

## 2021-06-28 MED ORDER — FLUOXETINE HCL 20 MG PO CAPS
20.0000 mg | ORAL_CAPSULE | Freq: Every morning | ORAL | Status: DC
  Administered 2021-06-29 – 2021-06-30 (×2): 20 mg via ORAL
  Filled 2021-06-28 (×2): qty 1

## 2021-06-28 MED ORDER — HYDROCODONE-ACETAMINOPHEN 5-325 MG PO TABS: 1.0000 | ORAL_TABLET | Freq: Four times a day (QID) | ORAL | 0 refills | Status: DC | PRN

## 2021-06-28 MED ORDER — AMIODARONE HCL IN DEXTROSE 360-4.14 MG/200ML-% IV SOLN
30.0000 mg/h | INTRAVENOUS | Status: DC
  Administered 2021-06-29 (×2): 30 mg/h via INTRAVENOUS
  Filled 2021-06-28 (×3): qty 200

## 2021-06-28 MED ORDER — AMIODARONE LOAD VIA INFUSION
150.0000 mg | Freq: Once | INTRAVENOUS | Status: AC
  Administered 2021-06-28: 150 mg via INTRAVENOUS
  Filled 2021-06-28: qty 83.34

## 2021-06-28 MED ORDER — LORAZEPAM 0.5 MG PO TABS
0.5000 mg | ORAL_TABLET | Freq: Three times a day (TID) | ORAL | Status: DC | PRN
  Administered 2021-06-29: 0.5 mg via ORAL
  Filled 2021-06-28: qty 1

## 2021-06-28 MED ORDER — DILTIAZEM HCL-DEXTROSE 125-5 MG/125ML-% IV SOLN (PREMIX)
5.0000 mg/h | INTRAVENOUS | Status: DC
  Administered 2021-06-28: 5 mg/h via INTRAVENOUS
  Filled 2021-06-28: qty 125

## 2021-06-28 MED ORDER — ONDANSETRON HCL 4 MG/2ML IJ SOLN: 4.0000 mg | Freq: Four times a day (QID) | INTRAMUSCULAR | Status: DC | PRN

## 2021-06-28 MED ORDER — ZOLPIDEM TARTRATE 5 MG PO TABS
5.0000 mg | ORAL_TABLET | Freq: Every evening | ORAL | Status: DC | PRN
  Administered 2021-06-29: 5 mg via ORAL
  Filled 2021-06-28: qty 1

## 2021-06-28 MED ORDER — PANTOPRAZOLE SODIUM 40 MG PO TBEC
40.0000 mg | DELAYED_RELEASE_TABLET | Freq: Two times a day (BID) | ORAL | Status: DC
  Administered 2021-06-28 – 2021-06-30 (×5): 40 mg via ORAL
  Filled 2021-06-28 (×5): qty 1

## 2021-06-28 MED ORDER — ROSUVASTATIN CALCIUM 20 MG PO TABS
20.0000 mg | ORAL_TABLET | Freq: Every evening | ORAL | Status: DC
  Administered 2021-06-28 – 2021-06-29 (×2): 20 mg via ORAL
  Filled 2021-06-28 (×2): qty 1

## 2021-06-28 MED ORDER — SODIUM CHLORIDE 0.9 % IV BOLUS
1000.0000 mL | Freq: Once | INTRAVENOUS | Status: AC
  Administered 2021-06-28: 1000 mL via INTRAVENOUS

## 2021-06-28 MED ORDER — HYDROCODONE-ACETAMINOPHEN 5-325 MG PO TABS
1.0000 | ORAL_TABLET | Freq: Four times a day (QID) | ORAL | Status: DC | PRN
  Administered 2021-06-30: 1 via ORAL
  Filled 2021-06-28: qty 1

## 2021-06-28 MED ORDER — AMIODARONE HCL IN DEXTROSE 360-4.14 MG/200ML-% IV SOLN
60.0000 mg/h | INTRAVENOUS | Status: AC
  Administered 2021-06-28 (×3): 60 mg/h via INTRAVENOUS
  Filled 2021-06-28 (×2): qty 200

## 2021-06-28 NOTE — ED Triage Notes (Signed)
Pt. Arrived via POV. Pt. States they have been feeling fatigued for two weeks. Pt. Was seen by their PCP this morning and was told to come to the ED due to their abnormal EKG finding of A-fib.

## 2021-06-28 NOTE — ED Notes (Signed)
Pt ambulate with help to the bathroom.

## 2021-06-28 NOTE — ED Notes (Signed)
Dr verbalized restart meds.

## 2021-06-28 NOTE — ED Notes (Signed)
Dr. Wynetta Emery gave order to give pt another 1L NS bolus to help support BP and to keep Cardizem turned off at this time. Will continue to monitor.

## 2021-06-28 NOTE — H&P (Signed)
History and Physical    Abigail Wagner VOZ:[id number] DOB: 10/17/1950 DOA: 06/28/2021  Referring MD/NP/PA: Milton Ferguson MD PCP: Susy Frizzle, MD Patient coming from: home / pcp office  Chief Complaint: weakness/ new diagnosis of atrial fib  HPI: Abigail Wagner is a 70 y.o. female with medical history significant of htn, bipolar, anxiety, gerd, multiple cancers(ovarian, rectal), ibs. She reports several weeks of 'feeling poorly', weakness. She reports several weeks ago, she was started on 40mg  lasix po daily for lower extremity edema, and she did not increase her daily water/fluid intake. She reports she drinks 2-3 1L bottles daily. She reports weakness when standing, including a fall when standing from bed 3 weeks ago approxiamtely, striking her head, without loss of consciousness, without nausea or vomiting, without any alteration to her mental status, while not anticoagulated. She reports recently 20+ lbs weight loss they (pt and husband) attribute to fluid loss from lasix. She endorses rare night sweats, denies fever. She reports taking 3x bc powders daily, possibly 1g aspirin and 65mg  caffeine per dose. She denies dark stools or blood in stools. She does have conjunctival pallor and a hemoglobin of 11.9 currently, increased rdw. She was found today, 10/4, to have afib, with a rate of 120's upon visiting her pcp, who then referred her to come to the er.  ED Course: labs as listed, fluid boluses after revealing poor skin turgor and current use of lasix without adjusted fluid intake. Cardizem was considered for rate control, with resulting hypotension, which was then discontinued, to replete fluid volume and re-assess rate after beginning to correct dehydration and AKI.  Patient has indwelling port  Review of Systems: As per HPI otherwise 10 point review of systems negative.    Past Medical History:  Diagnosis Date   Allergy    Anal carcinoma [facility]) oncologist-  dr Whitney Muse ([facility])    dx 12/ 2016 [facility] --  chemo and radiation- Dr. Marcello Moores   Anemia    due to her cancer   Anxiety    Dr. Harrington Challenger at [place] (psychiatrist).     Carcinoma of ovary, stage 3 [facility]) oncologist-  dr gehrig/  dr Doreene Eland (cancer center [place] [facility])--  no recurrency   dx 09/ 2015  Stage IIIB  papillary ovarian carcinoma  s/p  omentectomy and BSO &  chemotherapy (completed 12-07-2014)   Chemotherapy induced nausea and vomiting 10/23/2014   Chronic kidney disease    stones   DDD (degenerative disc disease), lumbosacral    GERD (gastroesophageal reflux disease)    Headache(784.0)    History of acute respiratory failure    05-30-2009  drug overdose-- (intubated for 2 days)  and aspiration pneumonia   History of adenomatous polyp of colon 10/07/2015   tubular adenoma high grade dysplasia   History of herpes genitalis    History of kidney stones    History of ovarian cancer 08/03/2015   History of suicide attempt    per documentation in epic  05-30-2009  overdose benaodiazepine   HTN (hypertension)    takes Metoprolol daily   Hyperlipidemia    Hypertension 09/27/2016   IBS (irritable bowel syndrome)    takes Bentyl daily   Internal carotid artery stenosis, left    <50%, ulcerative plaque at carotid bulb.    Major depression    Mood disorder (HCC)    OA (osteoarthritis)    both hips   Ovarian cancer (HCC)    2015-TAH/BSO   Prolapse of vaginal  vault after hysterectomy 07/20/2015   Rectovaginal fistula 08/05/2015   Sigmoid diverticulosis    Smokers' cough [facility])     Past Surgical History:  Procedure Laterality Date   BIOPSY  10/12/2020   Procedure: BIOPSY;  Surgeon: Harvel Quale, MD;  [facility];  Service: Gastroenterology;;   BREAST ENHANCEMENT [place]   COLONOSCOPY N/A 10/07/2015   Procedure: COLONOSCOPY;  Surgeon: Rogene Houston, MD;  [facility];  Service: Endoscopy;  Laterality: N/A;  7:30   COLONOSCOPY WITH  PROPOFOL N/A 10/12/2020   Procedure: COLONOSCOPY WITH PROPOFOL;  Surgeon: Harvel Quale, MD;  [facility];  Service: Gastroenterology;  Laterality: N/A;  9:00   ESOPHAGOGASTRODUODENOSCOPY (EGD) WITH PROPOFOL N/A 10/12/2020   Procedure: ESOPHAGOGASTRODUODENOSCOPY (EGD) WITH PROPOFOL;  Surgeon: Harvel Quale, MD;  [facility];  Service: Gastroenterology;  Laterality: N/A;   EXPLORATORY LAPAROTOMY/ OMENTECTOMY/  BILATERAL SALPINGOOPHORECTOMY/  PORT-A-CATH PLACEMENT  07-03-2014   [place]   KNEE ARTHROSCOPY Left 09-22-2004   LAPAROSCOPY N/A 06/02/2014   Procedure: DIAGNOSTIC LAPAROSCOPY, OMENTAL BIOPSY, RIGHT OVARY BIOPSY, LYSIS OF ADHESIONS;  Surgeon: Fanny Skates, MD;  Location: [place] [facility];  Service: General;  Laterality: N/A;   MUCOSAL ADVANCEMENT FLAP N/A 06/15/2016   Procedure: EXCISION RECTOVAGINAOL FISTULA WITH MUCOSAL ADVANCEMENT FLAP;  Surgeon: Leighton Ruff, MD;  Location: [place];  Service: General;  Laterality: N/A;   [place] N/A 09/09/2015   Procedure: PLACEMENT OF SETON;  Surgeon: Leighton Ruff, MD;  Location: [facility];  Service: General;  Laterality: N/A;   PORT-A-CATH REMOVAL Right 08/17/2014   Procedure: REMOVAL INTRAPERITONEAL CHEMO PORT;  Surgeon: Fanny Skates, MD;  Location: [place];  Service: General;  Laterality: Right;   PORTACATH PLACEMENT Right 08/17/2014   Procedure:  PLACE NEW PORT A CATH;  Surgeon: Fanny Skates, MD;  Location: [place];  Service: General;  Laterality: Right;   RECTAL BIOPSY N/A 09/09/2015   Procedure: BIOPSY OF RECTOVAGINAL MASS;  Surgeon: Leighton Ruff, MD;  Location: [place];  Service: General;  Laterality: N/A;   [place]   fibroids     reports that she has been smoking cigarettes. She has a 21.50 pack-year smoking history. She has never used smokeless tobacco.  She reports that she does not drink alcohol and does not use drugs.  Allergies  Allergen Reactions   Morphine And Related     Delirium with sbo    Family History  Problem Relation Age of Onset   Bipolar disorder Mother    Anxiety disorder Mother    Dementia Mother    Depression Mother    Mental illness Mother    Vision loss Mother    Alzheimer's disease Mother    Alcohol abuse Paternal Uncle    Bipolar disorder Maternal Grandmother    Dementia Maternal Grandmother    Alzheimer's disease Maternal Grandmother    Alcohol abuse Paternal Uncle    Stroke Brother    Deep vein thrombosis Son    Heart disease Father    Hyperlipidemia Father    Hypertension Father    Stroke Father    Vision loss Father    Atrial fibrillation Sister    Heart disease Brother    Heart disease Brother    ADD / ADHD Neg Hx    Drug abuse Neg Hx  OCD Neg Hx    Paranoid behavior Neg Hx    Schizophrenia Neg Hx    Seizures Neg Hx    Sexual abuse Neg Hx    Physical abuse Neg Hx      Prior to Admission medications   Medication Sig Start Date End Date Taking? Authorizing Provider  Aspirin-Caffeine (BC FAST PAIN RELIEF ARTHRITIS) 1000-65 MG PACK Take 1 tablet by mouth daily as needed (pain).   Yes [provider]  b complex vitamins tablet Take 1 tablet by mouth daily.   Yes [provider]  Cholecalciferol (VITAMIN D-3) 125 MCG (5000 UT) TABS Take 5,000 Units by mouth daily.   Yes [provider]  Coenzyme Q10 (CO Q 10) 100 MG CAPS Take 100 mg by mouth daily.   Yes [provider]  divalproex (DEPAKOTE ER) 500 MG 24 hr tablet Take 1 tablet (500 mg total) by mouth daily. 05/27/21  Yes [name], MD  furosemide (LASIX) 40 MG tablet Take 1 tablet (40 mg total) by mouth daily. 05/23/21  Yes Susy Frizzle, MD  HYDROcodone-acetaminophen (NORCO) 5-325 MG tablet Take 1 tablet by mouth every 6 (six) hours as needed for moderate pain. 06/28/21  Yes Susy Frizzle, MD   KRILL OIL PO Take 350 mg by mouth daily.   Yes [provider]  LORazepam (ATIVAN) 0.5 MG tablet Take 1 tablet (0.5 mg total) by mouth 3 (three) times daily as needed for anxiety. 05/27/21  Yes [name], MD  Multiple Vitamin (MULTIVITAMIN) tablet Take 1 tablet by mouth daily.   Yes [provider]  Multiple Vitamins-Minerals (HAIR SKIN & NAILS ADVANCED PO) Take by mouth.   Yes [provider]  omeprazole (PRILOSEC) 40 MG capsule Take 1 capsule (40 mg total) by mouth daily. 12/30/20  Yes Susy Frizzle, MD  ondansetron (ZOFRAN) 4 MG tablet TAKE 1 TABLET BY MOUTH EVERY 8 HOURS AS NEEDED FOR NAUSEA AND VOMITING Patient taking differently: Take 4 mg by mouth every 8 (eight) hours as needed for nausea or vomiting. 04/26/21  Yes Susy Frizzle, MD  OVER THE COUNTER MEDICATION Take 1 tablet by mouth in the morning and at bedtime. Patient reports that she is taking a ViViscal Tablet by mouth twice daily for hair loss.   Yes [provider]  polyethylene glycol (MIRALAX / GLYCOLAX) 17 g packet Take 17 g by mouth daily. Patient taking differently: Take 17 g by mouth daily as needed for moderate constipation. 05/28/20  Yes Barton Dubois, MD  rosuvastatin (CRESTOR) 20 MG tablet Take 1 tablet (20 mg total) by mouth daily. 11/09/20  Yes Susy Frizzle, MD  FLUoxetine (PROZAC) 20 MG capsule Take 1 capsule (20 mg total) by mouth every morning. Patient not taking: Reported on 06/28/2021 05/27/21 05/27/22  [name], MD    Physical Exam: Vitals:   06/28/21 1315 06/28/21 1400 06/28/21 1415 06/28/21 1426  BP: 103/70 (!) 94/54 (!) 75/43 (!) 79/62  Pulse: (!) 138 (!) 137 (!) 125 (!) 117  Resp: 19 (!) 22 19 16   Temp:      TempSrc:      SpO2: 100% 100% 100% 99%  Weight:      Height:          Constitutional: NAD, calm, comfortable Vitals:   06/28/21 1315 06/28/21 1400 06/28/21 1415 06/28/21 1426  BP: 103/70 (!) 94/54 (!) 75/43 (!) 79/62  Pulse: (!) 138 (!) 137  (!) 125 (!) 117  Resp:  19 (!) 22 19 16   Temp:      TempSrc:      SpO2: 100% 100% 100% 99%  Weight:      Height:       Eyes: PERRL, conjunctival pallor.  ENMT: Mucous membranes are moist, pt drinking water. Posterior pharynx clear of any exudate or lesions.Normal dentition.   Neck: normal, supple, no masses, no thyromegaly  Respiratory: clear to auscultation bilaterally, no wheezing, no crackles. Normal respiratory effort. No accessory muscle use.   Cardiovascular: irregular, s1 and s2, irregular, without overt murmurs noted. No current pedal edema. Thready radial pulse, irregular.   Abdomen: no tenderness, no masses palpated. No hepatosplenomegaly. Bowel sounds positive.   Musculoskeletal: clubbing, no cyanosis. No joint deformity upper and lower extremities. Good ROM, no contractures. Normal muscle tone.   Skin: no rashes, pt has bruise on the right periorbital she attributes to her fall 3x weeks ago. Poor skin turgor.  Neurologic: CN 2-12 grossly intact. Sensation intact.  Psychiatric: Normal judgment and insight. Alert and oriented x 3. Normal mood.    Labs on Admission: I have personally reviewed following labs and imaging studies  CBC: Recent Labs  Lab 06/28/21 1154  WBC 3.7*  NEUTROABS 2.2  HGB 11.9*  HCT 36.2  MCV 97.8  PLT 517   Basic Metabolic Panel: Recent Labs  Lab 06/28/21 1154  NA 138  K 3.8  CL 102  CO2 25  GLUCOSE 97  BUN 26*  CREATININE 1.10*  CALCIUM 8.7*   GFR: Estimated Creatinine Clearance: 42.2 mL/min (A) (by C-G formula based on SCr of 1.1 mg/dL (H)). Liver Function Tests: Recent Labs  Lab 06/28/21 1154  AST 27  ALT 14  ALKPHOS 43  BILITOT 0.7  PROT 6.6  ALBUMIN 3.9   No results for input(s): LIPASE, AMYLASE in the last 168 hours. No results for input(s): AMMONIA in the last 168 hours. Coagulation Profile: No results for input(s): INR, PROTIME in the last 168 hours. Cardiac Enzymes: No results for input(s): CKTOTAL, CKMB,  CKMBINDEX, TROPONINI in the last 168 hours. BNP (last 3 results) No results for input(s): PROBNP in the last 8760 hours. HbA1C: No results for input(s): HGBA1C in the last 72 hours. CBG: No results for input(s): GLUCAP in the last 168 hours. Lipid Profile: No results for input(s): CHOL, HDL, LDLCALC, TRIG, CHOLHDL, LDLDIRECT in the last 72 hours. Thyroid Function Tests: No results for input(s): TSH, T4TOTAL, FREET4, T3FREE, THYROIDAB in the last 72 hours. Anemia Panel: No results for input(s): VITAMINB12, FOLATE, FERRITIN, TIBC, IRON, RETICCTPCT in the last 72 hours. Urine analysis:    Component Value Date/Time   COLORURINE YELLOW 05/25/2020 1103   APPEARANCEUR HAZY (A) 05/25/2020 1103   APPEARANCEUR Cloudy (A) 08/03/2015 1100   LABSPEC 1.019 05/25/2020 1103   PHURINE 7.0 05/25/2020 1103   GLUCOSEU NEGATIVE 05/25/2020 1103   HGBUR NEGATIVE 05/25/2020 1103   BILIRUBINUR NEGATIVE 05/25/2020 1103   BILIRUBINUR Negative 08/03/2015 1100   KETONESUR 20 (A) 05/25/2020 1103   PROTEINUR NEGATIVE 05/25/2020 1103   UROBILINOGEN 0.2 05/27/2014 1428   NITRITE NEGATIVE 05/25/2020 1103   LEUKOCYTESUR NEGATIVE 05/25/2020 1103   Sepsis Labs: @LABRCNTIP (procalcitonin:4,lacticidven:4) ) Recent Results (from the past 240 hour(s))  Resp Panel by RT-PCR (Flu A&B, Covid) Nasopharyngeal Swab     Status: None   Collection Time: 06/28/21 12:20 PM   Specimen: Nasopharyngeal Swab; Nasopharyngeal(NP) swabs in vial transport medium  Result Value Ref Range Status   SARS Coronavirus 2 by RT PCR NEGATIVE  NEGATIVE Final    Comment: (NOTE) SARS-CoV-2 target nucleic acids are NOT DETECTED.  The SARS-CoV-2 RNA is generally detectable in upper respiratory specimens during the acute phase of infection. The lowest concentration of SARS-CoV-2 viral copies this assay can detect is 138 copies/mL. A negative result does not preclude SARS-Cov-2 infection and should not be used as the sole basis for treatment  or other patient management decisions. A negative result may occur with  improper specimen collection/handling, submission of specimen other than nasopharyngeal swab, presence of viral mutation(s) within the areas targeted by this assay, and inadequate number of viral copies(<138 copies/mL). A negative result must be combined with clinical observations, patient history, and epidemiological information. The expected result is Negative.  Fact Sheet for Patients:  EntrepreneurPulse.com.au  Fact Sheet for Healthcare Providers:  IncredibleEmployment.be  This test is no t yet approved or cleared by the [place] FDA and  has been authorized for detection and/or diagnosis of SARS-CoV-2 by FDA under an Emergency Use Authorization (EUA). This EUA will remain  in effect (meaning this test can be used) for the duration of the COVID-19 declaration under Section 564(b)(1) of the Act, 21 U.S.C.section 360bbb-3(b)(1), unless the authorization is terminated  or revoked sooner.       Influenza A by PCR NEGATIVE NEGATIVE Final   Influenza B by PCR NEGATIVE NEGATIVE Final    Comment: (NOTE) The Xpert Xpress SARS-CoV-2/FLU/RSV plus assay is intended as an aid in the diagnosis of influenza from Nasopharyngeal swab specimens and should not be used as a sole basis for treatment. Nasal washings and aspirates are unacceptable for Xpert Xpress SARS-CoV-2/FLU/RSV testing.  Fact Sheet for Patients: EntrepreneurPulse.com.au  Fact Sheet for Healthcare Providers: IncredibleEmployment.be  This test is not yet approved or cleared by the [place] FDA and has been authorized for detection and/or diagnosis of SARS-CoV-2 by FDA under an Emergency Use Authorization (EUA). This EUA will remain in effect (meaning this test can be used) for the duration of the COVID-19 declaration under Section 564(b)(1) of the Act, 21 U.S.C. section  360bbb-3(b)(1), unless the authorization is terminated or revoked.  Performed at [facility], [address] [place], [place] [address]      Radiological Exams on Admission: DG Chest Port 1 View  Result Date: 06/28/2021 CLINICAL DATA:  Fatigue EXAM: PORTABLE CHEST 1 VIEW COMPARISON:  08/17/2014 FINDINGS: Right chest port with catheter tip in the right atrium. Normal cardiac and mediastinal contours. Aortic calcifications. No focal pulmonary opacity. No pleural effusion or pneumothorax. No acute osseous abnormality. IMPRESSION: No active disease. Electronically Signed   By: Merilyn Baba M.D.   On: 06/28/2021 12:24    EKG: Independently reviewed. Atrial fib, rate 128 beats per minute approximately, poor baseline with no obvious st segment abnormalities,   Assessment/Plan Principal Problem:   Atrial fibrillation with RVR -probably secondary to dehydration -new diagnosis -not currently anticoagulated -rate considered, but not prime suspect for hypotension, will re-visit after rehydration -cardizem drip started, bp did not tolerate, DC, resumed fluid.  Active Problems:    AKI (acute kidney injury) -noted crt 1.1 on 10/4, normal 0.7-0.8 -rehydrating -no current electrolyte abnormalities -stopping home lasix -avoiding nephrotoxic agents -stop nsaids / BC     Abnormal weight loss -possibly due to recent lasix -recent CT without notable findings -25+ pack year without chest ct    Dehydration -aki noted -poor skin turgor -recent lasix without added fluids    Poor appetite   -patient reports llq abd pain in march,  reports diverticulitis -reports poor oral intake for several weeks    Essential hypertension -hold any home bp meds, lasix    Bipolar 1 disorder -continue home depakote -continue lexapro    GERD (gastroesophageal reflux disease) -no current complaints -stop home 3 g daily aspirin -continue home omeprazole      Anxiety -continue home lorazepam    Chronic  diarrhea -currently reports soft, non diarrhea    Chronic prescription opiate use -history of multiple surgeries -prn pain medications  Tobacco use -current 0/5 ppd -nicotine patch -discuss smoking cessation    Recurrent Diverticulitis -no current complaints      Epithelial ovarian cancer, FIGO stage IIIA (HCC) -total hysterectomy -recent ct abd w contrast w/o notable findings   DVT prophylaxis: apixaban Code Status: full Family Communication: husband at bedside Disposition Plan: volume repletion, cardiology consult for afib, re-assess electrolytes Consults called:  Admission status: inpatient  Severity of Illness: The appropriate patient status for this patient is INPATIENT. Inpatient status is judged to be reasonable and necessary in order to provide the required intensity of service to ensure the patient's safety. The patient's presenting symptoms, physical exam findings, and initial radiographic and laboratory data in the context of their chronic comorbidities is felt to place them at high risk for further clinical deterioration. Furthermore, it is not anticipated that the patient will be medically stable for discharge from the hospital within 2 midnights of admission. The following factors support the patient status of inpatient.   " The patient's presenting symptoms include weakness, syncope, malaise. " The worrisome physical exam findings include tachycardia, afib, poor skin turgor, hypotension. " The initial radiographic and laboratory data are worrisome because of AKI. " The chronic co-morbidities include age, tobacco use.   * I certify that at the point of admission it is my clinical judgment that the patient will require inpatient hospital care spanning beyond 2 midnights from the point of admission due to high intensity of service, high risk for further deterioration and high frequency of surveillance required.Darrick Meigs D Gusta [employer] student [facility] Pager [phone]   If 7PM-7AM, please contact night-coverage [email or web address] Password [facility]  06/28/2021, 2:39 PM

## 2021-06-28 NOTE — ED Notes (Signed)
Pt's BP noted to be 90/61, 89/65 on recheck prior to starting Diltiazem drip. Fluid bolus started, but held Diltiazem drip and notified Dr. Roderic Palau. Will start drip once pt's BP increases.

## 2021-06-28 NOTE — Telephone Encounter (Signed)
Appointment scheduled.   Call placed to patient and patient made aware.  

## 2021-06-28 NOTE — ED Notes (Signed)
Pt b/p dropped 70/59. Amiodarone stopped after bolus given. Dr. Ree Kida waiting on orders. Pt in trendelenburg.

## 2021-06-28 NOTE — Progress Notes (Signed)
Subjective:    Patient ID: Abigail Wagner, female    DOB: [birth year], 70 y.o.   MRN: [id number]  HPI  Patient called yesterday with blood pressure report.  Of note her heart rate was over 120 consistently for the last week.  Therefore I asked the patient to come in immediately for an evaluation.  This morning her heart rate is between 140 and 150.  Her blood pressure is extremely soft at 100/58.  She reports feeling extremely fatigued and tired but she denies any shortness of breath or chest pain.  She is not fluid overloaded on exam however she is in atrial fibrillation with rapid ventricular response.  There is no evidence of ischemia or infarction on her EKG. Past Medical History:  Diagnosis Date   Allergy    Anal carcinoma [facility]) oncologist-  dr Whitney Muse ([facility])   dx 12/ 2016 [facility] --  chemo and radiation- Dr. Marcello Moores   Anemia    due to her cancer   Anxiety    Dr. Harrington Challenger at [place] (psychiatrist).     Carcinoma of ovary, stage 3 [facility]) oncologist-  dr gehrig/  dr Doreene Eland (cancer center in [place] w/ [facility])--  no recurrency   dx 09/ 2015  Stage IIIB  papillary ovarian carcinoma  s/p  omentectomy and BSO &  chemotherapy (completed 12-07-2014)   Chemotherapy induced nausea and vomiting 10/23/2014   Chronic kidney disease    stones   DDD (degenerative disc disease), lumbosacral    GERD (gastroesophageal reflux disease)    Headache(784.0)    History of acute respiratory failure    05-30-2009  drug overdose-- (intubated for 2 days)  and aspiration pneumonia   History of adenomatous polyp of colon 10/07/2015   tubular adenoma high grade dysplasia   History of herpes genitalis    History of kidney stones    History of ovarian cancer 08/03/2015   History of suicide attempt    per documentation in epic  05-30-2009  overdose benaodiazepine   HTN (hypertension)    takes Metoprolol daily   Hyperlipidemia    Hypertension 09/27/2016   IBS (irritable bowel syndrome)    takes Bentyl daily    Internal carotid artery stenosis, left    <50%, ulcerative plaque at carotid bulb.    Major depression    Mood disorder (HCC)    OA (osteoarthritis)    both hips   Ovarian cancer ([place])    2015-TAH/BSO   Prolapse of vaginal vault after hysterectomy 07/20/2015   Rectovaginal fistula 08/05/2015   Sigmoid diverticulosis    Smokers' cough [facility])     Past Surgical History:  Procedure Laterality Date   BIOPSY  10/12/2020   Procedure: BIOPSY;  Surgeon: Harvel Quale, MD;  [facility];  Service: Gastroenterology;;   [place]   COLONOSCOPY N/A 10/07/2015   Procedure: COLONOSCOPY;  Surgeon: Rogene Houston, MD;  [facility];  Service: Endoscopy;  Laterality: N/A;  7:30   COLONOSCOPY WITH PROPOFOL N/A 10/12/2020   Procedure: COLONOSCOPY WITH PROPOFOL;  Surgeon: Harvel Quale, MD;  [facility];  Service: Gastroenterology;  Laterality: N/A;  9:00   ESOPHAGOGASTRODUODENOSCOPY (EGD) WITH PROPOFOL N/A 10/12/2020   Procedure: ESOPHAGOGASTRODUODENOSCOPY (EGD) WITH PROPOFOL;  Surgeon: Harvel Quale, MD;  [facility];  Service: Gastroenterology;  Laterality: N/A;   EXPLORATORY LAPAROTOMY/ OMENTECTOMY/  BILATERAL SALPINGOOPHORECTOMY/  PORT-A-CATH PLACEMENT  07-03-2014  [place]   KNEE ARTHROSCOPY Left 09-22-2004   LAPAROSCOPY N/A 06/02/2014   Procedure: DIAGNOSTIC LAPAROSCOPY, OMENTAL BIOPSY, RIGHT OVARY BIOPSY, LYSIS OF ADHESIONS;  Surgeon: Fanny Skates, MD;  Location: [place];  Service: General;  Laterality: N/A;   MUCOSAL ADVANCEMENT FLAP N/A 06/15/2016   Procedure: EXCISION RECTOVAGINAOL FISTULA WITH MUCOSAL ADVANCEMENT FLAP;  Surgeon: Leighton Ruff, MD;  Location: [place];  Service: General;  Laterality: N/A;   [place] N/A 09/09/2015   Procedure: PLACEMENT OF SETON;  Surgeon: Leighton Ruff, MD;  Location: [facility];  Service:  General;  Laterality: N/A;   PORT-A-CATH REMOVAL Right 08/17/2014   Procedure: REMOVAL INTRAPERITONEAL CHEMO PORT;  Surgeon: Fanny Skates, MD;  Location: [place];  Service: General;  Laterality: Right;   PORTACATH PLACEMENT Right 08/17/2014   Procedure:  PLACE NEW PORT A CATH;  Surgeon: Fanny Skates, MD;  Location: [place];  Service: General;  Laterality: Right;   RECTAL BIOPSY N/A 09/09/2015   Procedure: BIOPSY OF RECTOVAGINAL MASS;  Surgeon: Leighton Ruff, MD;  Location: [place];  Service: General;  Laterality: N/A;   TUBAL LIGATION  YRS AGO   VAGINAL HYSTERECTOMY  1981   fibroids   Current Outpatient Medications on File Prior to Visit  Medication Sig Dispense Refill   b complex vitamins tablet Take 1 tablet by mouth daily.     Cholecalciferol (VITAMIN D-3) 125 MCG (5000 UT) TABS Take 5,000 Units by mouth daily.     Coenzyme Q10 (CO Q 10) 100 MG CAPS Take 100 mg by mouth daily.     Collagen-Vitamin C-Biotin (COLLAGEN 1500/C PO) Take by mouth.     furosemide (LASIX) 40 MG tablet Take 1 tablet (40 mg total) by mouth daily. 30 tablet 3   HYDROcodone-acetaminophen (NORCO) 5-325 MG tablet Take 1 tablet by mouth every 6 (six) hours as needed for moderate pain. 30 tablet 0   KRILL OIL PO Take 350 mg by mouth daily.     LORazepam (ATIVAN) 0.5 MG tablet Take 1 tablet (0.5 mg total) by mouth 3 (three) times daily as needed for anxiety. 90 tablet 2   Multiple Vitamin (MULTIVITAMIN) tablet Take 1 tablet by mouth daily.     Multiple Vitamins-Minerals (HAIR SKIN & NAILS ADVANCED PO) Take by mouth.     omeprazole (PRILOSEC) 40 MG capsule Take 1 capsule (40 mg total) by mouth daily. 90 capsule 3   ondansetron (ZOFRAN) 4 MG tablet TAKE 1 TABLET BY MOUTH EVERY 8 HOURS AS NEEDED FOR NAUSEA AND VOMITING 30 tablet 1   OVER THE COUNTER MEDICATION Take 1 tablet by mouth in the morning and at bedtime. Patient reports that she is taking a ViViscal Tablet  by mouth twice daily for hair loss.     polyethylene glycol (MIRALAX / GLYCOLAX) 17 g packet Take 17 g by mouth daily. (Patient taking differently: Take 17 g by mouth daily as needed for moderate constipation.) 30 each 1   rosuvastatin (CRESTOR) 20 MG tablet Take 1 tablet (20 mg total) by mouth daily. 90 tablet 3   divalproex (DEPAKOTE ER) 500 MG 24 hr tablet Take 1 tablet (500 mg total) by mouth daily. (Patient not taking: Reported on 06/28/2021) 90 tablet 3   FLUoxetine (PROZAC) 20 MG capsule Take 1 capsule (20 mg total) by mouth every morning. (Patient not taking: Reported on 06/28/2021) 30 capsule 2   No current facility-administered medications on file prior to visit.  Allergies  Allergen Reactions   Morphine And Related     Delirium with sbo   Social History   Socioeconomic History   Marital status: Married    Spouse name: Coralyn Mark   Number of children: 3   Years of education: 12   Highest education level: Not on file  Occupational History   Occupation: retire    Comment: [employer]  Tobacco Use   Smoking status: Every Day    Packs/day: 0.50    Years: 43.00    Pack years: 21.50    Types: Cigarettes   Smokeless tobacco: Never  Vaping Use   Vaping Use: Never used  Substance and Sexual Activity   Alcohol use: No   Drug use: No   Sexual activity: Not Currently    Birth control/protection: Surgical    Comment: hyst  Other Topics Concern   Not on file  Social History Narrative   Lives at home with Coralyn Mark   Retired [employer]   Social Determinants of Health   Financial Resource Strain: Low Risk    Difficulty of Paying Living Expenses: Not very hard  Food Insecurity: Not on file  Transportation Needs: Not on file  Physical Activity: Not on file  Stress: Not on file  Social Connections: Not on file  Intimate Partner Violence: Not on file   Family History  Problem Relation Age of Onset   Bipolar disorder Mother    Anxiety disorder Mother    Dementia Mother     Depression Mother    Mental illness Mother    Vision loss Mother    Alzheimer's disease Mother    Alcohol abuse Paternal Uncle    Bipolar disorder Maternal Grandmother    Dementia Maternal Grandmother    Alzheimer's disease Maternal Grandmother    Alcohol abuse Paternal Uncle    Stroke Brother    Deep vein thrombosis Son    Heart disease Father    Hyperlipidemia Father    Hypertension Father    Stroke Father    Vision loss Father    Atrial fibrillation Sister    Heart disease Brother    Heart disease Brother    ADD / ADHD Neg Hx    Drug abuse Neg Hx    OCD Neg Hx    Paranoid behavior Neg Hx    Schizophrenia Neg Hx    Seizures Neg Hx    Sexual abuse Neg Hx    Physical abuse Neg Hx       Review of Systems     Objective:   Physical Exam Vitals reviewed.  Constitutional:      General: She is not in acute distress.    Appearance: She is ill-appearing. She is not diaphoretic.  Cardiovascular:     Rate and Rhythm: Tachycardia present. Rhythm irregular.     Heart sounds: Normal heart sounds. No murmur heard.   No friction rub. No gallop.  Pulmonary:     Effort: Pulmonary effort is normal. No respiratory distress.     Breath sounds: Normal breath sounds. No stridor. No wheezing or rales.  Abdominal:     General: Abdomen is flat. Bowel sounds are normal. There is no distension.     Palpations: Abdomen is soft. There is no mass.     Tenderness: There is no abdominal tenderness.  Musculoskeletal:     Right upper leg: No deformity.     Left upper leg: No deformity.     Right lower leg: Edema present.  Left lower leg: Edema present.  Neurological:     Mental Status: She is alert.     Patient appears frail and cachectic.     Assessment & Plan:  Rapid heart beat - Plan: EKG 12-Lead  Atrial fibrillation with RVR ([place]) Patient is in atrial fibrillation with rapid ventricular response.  Unfortunately I am unable to start her on Cardizem or metoprolol because her  blood pressure simply will not tolerate either of these medications.  I am not certain the digoxin orally will be sufficient.  I feel that she requires hospitalization and IV medication to break the rapid ventricular response.  She would likely be a good candidate for amiodarone through cardiology consultation.  I have recommended she go to the emergency room evaluation immediately.  Patient agrees.  Will be notified

## 2021-06-28 NOTE — ED Notes (Signed)
BP 75/43. Cardizem stopped. Dr. Roderic Palau made aware and gave orders to place pt in Trendelenburg, give 567ml NS bolus and notify Dr. Wynetta Emery.

## 2021-06-29 ENCOUNTER — Encounter (HOSPITAL_COMMUNITY): Payer: Self-pay | Admitting: Family Medicine

## 2021-06-29 ENCOUNTER — Inpatient Hospital Stay (HOSPITAL_COMMUNITY): Payer: Medicare Other

## 2021-06-29 ENCOUNTER — Ambulatory Visit (HOSPITAL_COMMUNITY): Payer: Medicare Other | Admitting: Physical Therapy

## 2021-06-29 DIAGNOSIS — R4182 Altered mental status, unspecified: Secondary | ICD-10-CM | POA: Diagnosis not present

## 2021-06-29 DIAGNOSIS — R269 Unspecified abnormalities of gait and mobility: Secondary | ICD-10-CM | POA: Diagnosis not present

## 2021-06-29 DIAGNOSIS — I4891 Unspecified atrial fibrillation: Secondary | ICD-10-CM | POA: Diagnosis not present

## 2021-06-29 LAB — CBC
HCT: 35.3 % — ABNORMAL LOW (ref 36.0–46.0)
Hemoglobin: 11.3 g/dL — ABNORMAL LOW (ref 12.0–15.0)
MCH: 31.7 pg (ref 26.0–34.0)
MCHC: 32 g/dL (ref 30.0–36.0)
MCV: 98.9 fL (ref 80.0–100.0)
Platelets: 158 10*3/uL (ref 150–400)
RBC: 3.57 MIL/uL — ABNORMAL LOW (ref 3.87–5.11)
RDW: 16.9 % — ABNORMAL HIGH (ref 11.5–15.5)
WBC: 3.2 10*3/uL — ABNORMAL LOW (ref 4.0–10.5)
nRBC: 0 % (ref 0.0–0.2)

## 2021-06-29 LAB — MAGNESIUM: Magnesium: 2 mg/dL (ref 1.7–2.4)

## 2021-06-29 LAB — COMPREHENSIVE METABOLIC PANEL
ALT: 11 U/L (ref 0–44)
AST: 23 U/L (ref 15–41)
Albumin: 3.1 g/dL — ABNORMAL LOW (ref 3.5–5.0)
Alkaline Phosphatase: 37 U/L — ABNORMAL LOW (ref 38–126)
Anion gap: 7 (ref 5–15)
BUN: 19 mg/dL (ref 8–23)
CO2: 24 mmol/L (ref 22–32)
Calcium: 7.9 mg/dL — ABNORMAL LOW (ref 8.9–10.3)
Chloride: 112 mmol/L — ABNORMAL HIGH (ref 98–111)
Creatinine, Ser: 0.86 mg/dL (ref 0.44–1.00)
GFR, Estimated: >60 mL/min (ref >60)
Glucose, Bld: 77 mg/dL (ref 70–99)
Potassium: 3.7 mmol/L (ref 3.5–5.1)
Sodium: 143 mmol/L (ref 135–145)
Total Bilirubin: 0.5 mg/dL (ref 0.3–1.2)
Total Protein: 5.4 g/dL — ABNORMAL LOW (ref 6.5–8.1)

## 2021-06-29 LAB — LIPID PANEL
Cholesterol: 140 mg/dL (ref 0–200)
HDL: 51 mg/dL (ref >40)
LDL Cholesterol: 74 mg/dL (ref 0–99)
Total CHOL/HDL Ratio: 2.7 RATIO
Triglycerides: 76 mg/dL (ref <150)
VLDL: 15 mg/dL (ref 0–40)

## 2021-06-29 MED ORDER — ENSURE ENLIVE PO LIQD: 237.0000 mL | Freq: Two times a day (BID) | ORAL | Status: DC

## 2021-06-29 MED ORDER — CHLORHEXIDINE GLUCONATE CLOTH 2 % EX PADS
6.0000 | MEDICATED_PAD | Freq: Every day | CUTANEOUS | Status: DC
  Administered 2021-06-29 – 2021-06-30 (×2): 6 via TOPICAL

## 2021-06-29 MED ORDER — ADULT MULTIVITAMIN W/MINERALS CH
1.0000 | ORAL_TABLET | Freq: Every day | ORAL | Status: DC
  Administered 2021-06-29 – 2021-06-30 (×2): 1 via ORAL
  Filled 2021-06-29 (×2): qty 1

## 2021-06-29 MED ORDER — NICOTINE 14 MG/24HR TD PT24
14.0000 mg | MEDICATED_PATCH | Freq: Every day | TRANSDERMAL | Status: DC
  Administered 2021-06-29 – 2021-06-30 (×2): 14 mg via TRANSDERMAL
  Filled 2021-06-29 (×2): qty 1

## 2021-06-29 MED ORDER — FOLIC ACID 1 MG PO TABS
1.0000 mg | ORAL_TABLET | Freq: Every day | ORAL | Status: DC
  Administered 2021-06-29 – 2021-06-30 (×2): 1 mg via ORAL
  Filled 2021-06-29 (×2): qty 1

## 2021-06-29 MED ORDER — LORAZEPAM 1 MG PO TABS: 1.0000 mg | ORAL_TABLET | ORAL | Status: DC | PRN

## 2021-06-29 MED ORDER — THIAMINE HCL 100 MG PO TABS
100.0000 mg | ORAL_TABLET | Freq: Every day | ORAL | Status: DC
  Administered 2021-06-29 – 2021-06-30 (×2): 100 mg via ORAL
  Filled 2021-06-29 (×2): qty 1

## 2021-06-29 MED ORDER — LORAZEPAM 2 MG/ML IJ SOLN
1.0000 mg | INTRAMUSCULAR | Status: DC | PRN
Start: 2021-06-29 — End: 2021-06-30

## 2021-06-29 MED ORDER — THIAMINE HCL 100 MG/ML IJ SOLN: 100.0000 mg | Freq: Every day | INTRAMUSCULAR | Status: DC

## 2021-06-29 NOTE — Consult Note (Signed)
Cardiology Consultation:   Patient ID: Abigail Wagner; [id number]; 24-Apr-1951   Admit date: 06/28/2021 Date of Consult: 06/29/2021  Primary Care Provider: Susy Frizzle, MD Primary Cardiologist: New to [facility] Primary Electrophysiologist: None   Patient Profile:   Abigail Wagner is a 70 y.o. female with a history of nonobstructive carotid artery disease, depression, tobacco use, hyperlipidemia, hypertension, previously treated ovarian cancer and anal carcinoma who is being seen today for the evaluation of newly documented atrial fibrillation with RVR at the request of Abigail Wagner.  History of Present Illness:   Abigail Wagner was sent to the [facility] ER yesterday by her PCP Abigail Wagner with newly documented atrial fibrillation with RVR.  She tells me that about a month ago she was having trouble with her balance, running into walls, somewhat confused.  She saw her behavioral health specialist for this.  At some point in the last few weeks her heart rate has been elevated as well based on vital sign check, although she does not feel any definite sense of palpitations.  She was found to be relatively hypotensive at presentation, improved with fluid boluses, although did not tolerate intravenous diltiazem and was placed on intravenous amiodarone.  Her heart rate is better controlled this morning.  CHA2DS2-VASc score is at least 4.  Recent echocardiogram on September 20 revealed LVEF 60 to 65% with moderate diastolic dysfunction, normal estimated RVSP, normal left atrial chamber size, sclerotic aortic valve without stenosis, and moderate tricuspid regurgitation.  Past Medical History:  Diagnosis Date   Anal carcinoma ([place])    Chemo and radiation   Anemia    Anxiety    Abigail Wagner at [place] (psychiatrist).     Carotid artery disease (HCC)    DDD (degenerative disc disease), lumbosacral    GERD (gastroesophageal reflux disease)    Headache(784.0)    History of adenomatous polyp of colon  10/07/2015   Tubular adenoma high grade dysplasia   History of herpes genitalis    History of kidney stones    History of suicide attempt 2010   HTN (hypertension)    Hyperlipidemia    IBS (irritable bowel syndrome)    Major depression    OA (osteoarthritis)    Ovarian cancer ([place])    Stage IIIB papillary ovarian carcinoma  s/p omentectomy and BSO & chemotherapy (completed 12-07-2014)   Prolapse of vaginal vault after hysterectomy 07/20/2015   Rectovaginal fistula 08/05/2015   Seasonal allergies    Sigmoid diverticulosis    Smokers' cough [facility])     Past Surgical History:  Procedure Laterality Date   BIOPSY  10/12/2020   Procedure: BIOPSY;  Surgeon: Abigail Quale, MD;  [facility];  Service: Gastroenterology;;   BREAST ENHANCEMENT [place]   COLONOSCOPY N/A 10/07/2015   Procedure: COLONOSCOPY;  Surgeon: Abigail Houston, MD;  [facility];  Service: Endoscopy;  Laterality: N/A;  7:30   COLONOSCOPY WITH PROPOFOL N/A 10/12/2020   Procedure: COLONOSCOPY WITH PROPOFOL;  Surgeon: Abigail Quale, MD;  [facility];  Service: Gastroenterology;  Laterality: N/A;  9:00   ESOPHAGOGASTRODUODENOSCOPY (EGD) WITH PROPOFOL N/A 10/12/2020   Procedure: ESOPHAGOGASTRODUODENOSCOPY (EGD) WITH PROPOFOL;  Surgeon: Abigail Quale, MD;  [facility];  Service: Gastroenterology;  Laterality: N/A;   EXPLORATORY LAPAROTOMY/ OMENTECTOMY/  BILATERAL SALPINGOOPHORECTOMY/  PORT-A-CATH PLACEMENT  07-03-2014   [facility]   KNEE ARTHROSCOPY Left 09-22-2004   LAPAROSCOPY N/A 06/02/2014  Procedure: DIAGNOSTIC LAPAROSCOPY, OMENTAL BIOPSY, RIGHT OVARY BIOPSY, LYSIS OF ADHESIONS;  Surgeon: Abigail Skates, MD;  Location: [place];  Service: General;  Laterality: N/A;   MUCOSAL ADVANCEMENT FLAP N/A 06/15/2016   Procedure: EXCISION RECTOVAGINAOL FISTULA WITH MUCOSAL ADVANCEMENT FLAP;  Surgeon: Abigail Ruff, MD;  Location:  [place];  Service: General;  Laterality: N/A;   PLACEMENT OF SETON N/A 09/09/2015   Procedure: PLACEMENT OF SETON;  Surgeon: Abigail Ruff, MD;  Location: [place];  Service: General;  Laterality: N/A;   PORT-A-CATH REMOVAL Right 08/17/2014   Procedure: REMOVAL INTRAPERITONEAL CHEMO PORT;  Surgeon: Abigail Skates, MD;  Location: [place];  Service: General;  Laterality: Right;   PORTACATH PLACEMENT Right 08/17/2014   Procedure:  PLACE NEW PORT A CATH;  Surgeon: Abigail Skates, MD;  Location: [place];  Service: General;  Laterality: Right;   RECTAL BIOPSY N/A 09/09/2015   Procedure: BIOPSY OF RECTOVAGINAL MASS;  Surgeon: Abigail Ruff, MD;  Location: [place];  Service: General;  Laterality: N/A;   TUBAL LIGATION  YRS AGO   VAGINAL HYSTERECTOMY  1981   fibroids     Inpatient Medications: Scheduled Meds:  apixaban  5 mg Oral BID   Chlorhexidine Gluconate Cloth  6 each Topical Daily   divalproex  500 mg Oral Daily   FLUoxetine  20 mg Oral q morning   folic acid  1 mg Oral Daily   multivitamin with minerals  1 tablet Oral Daily   nicotine  14 mg Transdermal Daily   pantoprazole  40 mg Oral BID   rosuvastatin  20 mg Oral QPM   thiamine  100 mg Oral Daily   Or   thiamine  100 mg Intravenous Daily   Continuous Infusions:  sodium chloride 75 mL/hr at 06/28/21 2150   amiodarone 30 mg/hr (06/29/21 0710)   PRN Meds: acetaminophen, HYDROcodone-acetaminophen, LORazepam **OR** LORazepam, LORazepam, ondansetron (ZOFRAN) IV, polyethylene glycol, zolpidem  Allergies:    Allergies  Allergen Reactions   Morphine And Related     Delirium with sbo    Social History:   Social History   Tobacco Use   Smoking status: Every Day    Packs/day: 0.50    Years: 43.00    Pack years: 21.50    Types: Cigarettes   Smokeless tobacco: Never  Substance Use Topics   Alcohol use: No     Family History:   The  patient's family history includes Alcohol abuse in her paternal uncle and paternal uncle; Alzheimer's disease in her maternal grandmother and mother; Anxiety disorder in her mother; Atrial fibrillation in her sister; Bipolar disorder in her maternal grandmother and mother; Deep vein thrombosis in her son; Dementia in her maternal grandmother and mother; Depression in her mother; Heart disease in her brother, brother, and father; Hyperlipidemia in her father; Hypertension in her father; Mental illness in her mother; Stroke in her brother and father; Vision loss in her father and mother. There is no history of ADD / ADHD, Drug abuse, OCD, Paranoid behavior, Schizophrenia, Seizures, Sexual abuse, or Physical abuse.  ROS:  Please see the history of present illness.  All other ROS reviewed and negative.     Physical Exam/Data:   Vitals:   06/29/21 0730 06/29/21 0800 06/29/21 0815 06/29/21 0845  BP: 108/75 116/68 113/62 (!) 101/52  Pulse: 99 (!) 121 (!) 118 (!) 114  Resp: 15 14 17  (!) 21  Temp:    98.1 F (36.7  C)  TempSrc:    Oral  SpO2: 100% 98% 99% 100%  Weight:      Height:        Intake/Output Summary (Last 24 hours) at 06/29/2021 0939 Last data filed at 06/29/2021 0500 Gross per 24 hour  Intake 4380.08 ml  Output 300 ml  Net 4080.08 ml   Filed Weights   06/28/21 1125 06/28/21 1902 06/29/21 0445  Weight: 56.2 kg 53.3 kg 53.9 kg   Body mass index is 19.77 kg/m.   Gen: Slender, underweight appearing, no distress. HEENT: Conjunctiva and lids normal, oropharynx clear. Neck: Supple, no elevated JVP or carotid bruits, no thyromegaly. Lungs: Clear to auscultation, nonlabored breathing at rest. Cardiac: Irregularly irregular, no S3, 1/6 systolic murmur, no pericardial rub. Abdomen: Soft, nontender, bowel sounds present. Extremities: No pitting edema, distal pulses 2+. Skin: Warm and dry. Musculoskeletal: No kyphosis. Neuropsychiatric: Alert and oriented x3, affect grossly  appropriate.  Seems to have some trouble with word choice.  EKG:  An ECG dated 06/28/2021 was personally reviewed today and demonstrated:  Atrial fibrillation/flutter RVR, nonspecific ST changes  Telemetry:  I personally reviewed telemetry which shows coarse atrial fibrillation versus atypical atrial flutter with variable conduction.  Relevant CV Studies:  Echocardiogram 06/14/2021:  1. Left ventricular ejection fraction, by estimation, is 60 to 65%. The  left ventricle has normal function. The left ventricle has no regional  wall motion abnormalities. Left ventricular diastolic parameters are  consistent with Grade II diastolic  dysfunction (pseudonormalization).   2. Right ventricular systolic function is normal. The right ventricular  size is mildly enlarged. There is normal pulmonary artery systolic  pressure. The estimated right ventricular systolic pressure is 69.6 mmHg.   3. The mitral valve is normal in structure. Trivial mitral valve  regurgitation. No evidence of mitral stenosis.   4. Tricuspid valve regurgitation is moderate.   5. The aortic valve is tricuspid. Aortic valve regurgitation is trivial.  Mild to moderate aortic valve sclerosis/calcification is present, without  any evidence of aortic stenosis.   6. The inferior vena cava is normal in size with greater than 50%  respiratory variability, suggesting right atrial pressure of 3 mmHg.   Laboratory Data:  Chemistry Recent Labs  Lab 06/28/21 1154 06/29/21 0519  NA 138 143  K 3.8 3.7  CL 102 112*  CO2 25 24  GLUCOSE 97 77  BUN 26* 19  CREATININE 1.10* 0.86  CALCIUM 8.7* 7.9*  GFRNONAA 54* >60  ANIONGAP 11 7    Recent Labs  Lab 06/28/21 1154 06/29/21 0519  PROT 6.6 5.4*  ALBUMIN 3.9 3.1*  AST 27 23  ALT 14 11  ALKPHOS 43 37*  BILITOT 0.7 0.5   Hematology Recent Labs  Lab 06/28/21 1154 06/29/21 0519  WBC 3.7* 3.2*  RBC 3.70* 3.57*  HGB 11.9* 11.3*  HCT 36.2 35.3*  MCV 97.8 98.9  MCH 32.2  31.7  MCHC 32.9 32.0  RDW 16.7* 16.9*  PLT 185 158    Radiology/Studies:  DG Chest Port 1 View  Result Date: 06/28/2021 CLINICAL DATA:  Fatigue EXAM: PORTABLE CHEST 1 VIEW COMPARISON:  08/17/2014 FINDINGS: Right chest port with catheter tip in the right atrium. Normal cardiac and mediastinal contours. Aortic calcifications. No focal pulmonary opacity. No pleural effusion or pneumothorax. No acute osseous abnormality. IMPRESSION: No active disease. Electronically Signed   By: Merilyn Baba M.D.   On: 06/28/2021 12:24    Assessment and Plan:   1.  Newly documented persistent atrial fibrillation  presenting with RVR, duration not certain but potentially with onset over the last few weeks.  CHA2DS2-VASc score is at least 4.  Recent echocardiogram shows LVEF 60 to 65% with normal left atrial chamber size.  No major valvular abnormalities.  TSH 1.82 in June.  2.  Patient reports unsteadiness and confusion about one month ago, seems to have some trouble with word choice in discussing history now (but I am just meeting her and do not know her baseline).  I wonder whether she had a stroke in the setting of atrial fibrillation.  Discussed with Abigail Wagner.  3.  History of hypertension, blood pressure is low at this point.  May be a component of volume contraction as she has been on Lasix recently, but also potentially in association with her atrial fibrillation.  She is not on any antihypertensives as an outpatient.  4.  Nonobstructive carotid artery disease, last assessed by carotid Dopplers in September 2019.  She has been on Crestor as an outpatient.  Discussed with nursing and Abigail Wagner.  Would suggest brain MRI and carotid Dopplers.  Continue IV amiodarone, agree with initiation of Eliquis as well.  She does not need a follow-up echocardiogram given recent study.  Question regarding potential cardioversion timing will depend on how well her heart rate is controlled in the short-term and also whether  or not she had a recent CNS event.  We will follow with you.  Signed, Rozann Lesches, MD  06/29/2021 9:39 AM

## 2021-06-29 NOTE — Progress Notes (Signed)
Initial Nutrition Assessment  DOCUMENTATION CODES:   Not applicable  INTERVENTION:  Regular diet  Ensure Enlive po BID, each supplement provides 350 kcal and 20 grams of protein   NUTRITION DIAGNOSIS:   Inadequate oral intake related to acute illness as evidenced by per patient/family report.   GOAL:  Patient will meet greater than or equal to 90% of their needs  MONITOR:  PO intake, Supplement acceptance, Labs, Weight trends  REASON FOR ASSESSMENT:   Consult Assessment of nutrition requirement/status  ASSESSMENT: Patient is a 70 yo female who presents with weakness, confusion and new atrial fibrillation. PMH: GERD, HLD, Anemia, ovarian and anal cancer, HTN and IBS. Tobacco use and major depression. Cardiology consulted. MRI - brain pending.   Patient husband helps with grocery shopping. She prepare meals and completes light household chores until recently. Usually eats 2 meals daily and snacks ad lib.  Patient denies any changes in appetite or intake reports she is eating better here that at home. Feeds herself. Reports meal intake >50% breakfast and lunch today.   Review of weights range between 53-56 kg most of the past year.   Medications reviewed and include: folvite, MVI, Nicoderm patch, Protonix, Vit B-1 (thiamine).   IVF-NS@ 75 ml/hr  Labs: BMP Latest Ref Rng & Units 06/29/2021 06/28/2021 06/03/2021  Glucose 70 - 99 mg/dL 77 97 65  BUN 8 - 23 mg/dL 19 26(H) 24  Creatinine 0.44 - 1.00 mg/dL 0.86 1.10(H) 0.94  BUN/Creat Ratio 6 - 22 (calc) - - NOT APPLICABLE  Sodium 465 - 145 mmol/L 143 138 142  Potassium 3.5 - 5.1 mmol/L 3.7 3.8 4.0  Chloride 98 - 111 mmol/L 112(H) 102 104  CO2 22 - 32 mmol/L 24 25 28   Calcium 8.9 - 10.3 mg/dL 7.9(L) 8.7(L) 9.6      NUTRITION - FOCUSED PHYSICAL EXAM: Partial exam completed.   Diet Order:   Diet Order             Diet regular Room service appropriate? Yes; Fluid consistency: Thin  Diet effective now                    EDUCATION NEEDS:  Education needs have been addressed  Skin:  Skin Assessment: Reviewed RN Assessment  Last BM:  10/4  Height:   Ht Readings from Last 1 Encounters:  06/28/21 5\' 5"  (1.651 m)    Weight:   Wt Readings from Last 1 Encounters:  06/29/21 53.9 kg    Ideal Body Weight:   57 kg  BMI:  Body mass index is 19.77 kg/m.  Estimated Nutritional Needs:   Kcal:  1600-1800  Protein:  70-75 gr  Fluid:  1.6-1.8 liters daily   Colman Cater MS,RD,CSG,LDN Contact: Shea Evans

## 2021-06-29 NOTE — Progress Notes (Signed)
Patient Demographics:    Abigail Wagner, is a 70 y.o. female, DOB - 03-Aug-1951, VOZ:[id number]  Admit date - 06/28/2021   Admitting Physician Murlean Iba, MD  Outpatient Primary MD for the patient is Pickard, Cammie Mcgee, MD  LOS - 1   Chief Complaint  Patient presents with   Irregular Heart Beat        Subjective:    Abigail Wagner today has no fevers, no emesis,  No chest pain,   -BP and heart rate improving overnight on IV amiodarone and after IV fluids -Required oxygen overnight, now weaned off oxygen, back to room air now  Assessment  & Plan :    Principal Problem:   Atrial fibrillation with RVR ([place]) Active Problems:   Bipolar 1 disorder ([place])   Epithelial ovarian cancer, FIGO stage IIIA ([place])   Abnormal weight loss   Essential hypertension   GERD (gastroesophageal reflux disease)   Dehydration   Anxiety   Internal carotid artery stenosis, left   Chronic diarrhea   Chronic prescription opiate use   Recurrent Diverticulitis   Poor appetite   AKI (acute kidney injury) ([place])  Brief Summary:- 70 y.o. female with medical history significant of htn, bipolar, anxiety, gerd, multiple cancers(ovarian, rectal), ibs admitted on 06/28/2021 with new onset atrial fibrillation of unknown duration   A/p 1) new onset A. fib with RVR--diagnosed 06/28/2021 back the history could have been in A. fib for the past couple of weeks -BP improved with IV fluids, -Continue IV amiodarone for rate control unable to tolerate Cardizem due to low BP -Echo from 06/14/2021 with EF of 60 to 65% with grade 2 diastolic dysfunction, without mitral stenosis and without atrial enlargement -Cardiology consult appreciated -CHA2DS2-VASc score is 4 continue Eliquis for stroke prophylaxis -TSH WNL -  2)Dizziness/gait disturbance/confusional episodes/as well as episodes of speech disturbance  ----- as per the patient has been on  and off for at least 4 weeks now-- -Could be related to underlying A. Fib--however need to rule out stroke -Denies frank syncope, could have had presyncopal episodes per report -Brain MRI carotid artery Dopplers pending recent echo as noted above #1   3)Epithelial ovarian cancer, FIGO stage IIIA ([place]) -total hysterectomy -recent ct abd w contrast w/o notable findings   4) Nonobstructive carotid artery disease, last assessed by carotid Dopplers in September 2019.  She has been on Crestor as an outpatient. -Repeat carotid artery Dopplers pending  5)Tobacco Abuse--- patient advised, continue nicotine patch  6) Chronic prescription opiate use -history of multiple surgeries -Continue PTA pain medications  7)  Bipolar 1 disorder/Anxiety -continue home depakote -continue lexapro  continue lorazepam  Disposition/Need for in-Hospital Stay- patient unable to be discharged at this time due to --- A. fib with RVR with hemodynamic instability requiring IV fluids boluses and IV amiodarone Status is: Inpatient  Remains inpatient appropriate because: Please see disposition above  Disposition: The patient is from: Home              Anticipated d/c is to: Home              Anticipated d/c date is: 1 day              Patient currently is not medically  stable to d/c. Barriers: Not Clinically Stable-   Code Status :  -  Code Status: Full Code   Family Communication:    NA (patient is alert, awake and coherent)   Consults  :  Cardiology  DVT Prophylaxis  :   - SCDs   Place TED hose Start: 06/28/21 1411 apixaban (ELIQUIS) tablet 5 mg    Lab Results  Component Value Date   PLT 158 06/29/2021    Inpatient Medications  Scheduled Meds:  apixaban  5 mg Oral BID   Chlorhexidine Gluconate Cloth  6 each Topical Daily   divalproex  500 mg Oral Daily   FLUoxetine  20 mg Oral q morning   folic acid  1 mg Oral Daily   multivitamin with minerals  1 tablet Oral Daily   nicotine  14 mg  Transdermal Daily   pantoprazole  40 mg Oral BID   rosuvastatin  20 mg Oral QPM   thiamine  100 mg Oral Daily   Or   thiamine  100 mg Intravenous Daily   Continuous Infusions:  sodium chloride 75 mL/hr at 06/29/21 1113   amiodarone 30 mg/hr (06/29/21 0710)   PRN Meds:.acetaminophen, HYDROcodone-acetaminophen, LORazepam **OR** LORazepam, LORazepam, ondansetron (ZOFRAN) IV, polyethylene glycol, zolpidem    Anti-infectives (From admission, onward)    None         Objective:   Vitals:   06/29/21 1115 06/29/21 1130 06/29/21 1145 06/29/21 1200  BP: 105/62 (!) 113/41 104/63 113/89  Pulse: (!) 56 75 72 77  Resp: 17 20 16 19   Temp:      TempSrc:      SpO2: 98% 96% 99% 99%  Weight:      Height:        Wt Readings from Last 3 Encounters:  06/29/21 53.9 kg  06/28/21 56.2 kg  05/20/21 56.2 kg     Intake/Output Summary (Last 24 hours) at 06/29/2021 1454 Last data filed at 06/29/2021 0500 Gross per 24 hour  Intake 3371.72 ml  Output 300 ml  Net 3071.72 ml     Physical Exam  Gen:- Awake Alert,  in no apparent distress  HEENT:- [place].AT, No sclera icterus Neck-Supple Neck,No JVD,.  Lungs-  CTAB , fair symmetrical air movement CV- S1, S2 normal, irregularly irregular and tachycardic  abd-  +ve B.Sounds, Abd Soft, No tenderness,    Extremity/Skin:- No  edema, pedal pulses present  Psych-affect is appropriate, oriented x3 Neuro-on and off gait and speech concerns, no tremors   Data Review:   Micro Results Recent Results (from the past 240 hour(s))  Resp Panel by RT-PCR (Flu A&B, Covid) Nasopharyngeal Swab     Status: None   Collection Time: 06/28/21 12:20 PM   Specimen: Nasopharyngeal Swab; Nasopharyngeal(NP) swabs in vial transport medium  Result Value Ref Range Status   SARS Coronavirus 2 by RT PCR NEGATIVE NEGATIVE Final    Comment: (NOTE) SARS-CoV-2 target nucleic acids are NOT DETECTED.  The SARS-CoV-2 RNA is generally detectable in upper respiratory specimens  during the acute phase of infection. The lowest concentration of SARS-CoV-2 viral copies this assay can detect is 138 copies/mL. A negative result does not preclude SARS-Cov-2 infection and should not be used as the sole basis for treatment or other patient management decisions. A negative result may occur with  improper specimen collection/handling, submission of specimen other than nasopharyngeal swab, presence of viral mutation(s) within the areas targeted by this assay, and inadequate number of viral copies(<138 copies/mL). A negative result  must be combined with clinical observations, patient history, and epidemiological information. The expected result is Negative.  Fact Sheet for Patients:  EntrepreneurPulse.com.au  Fact Sheet for Healthcare Providers:  IncredibleEmployment.be  This test is no t yet approved or cleared by the [place] FDA and  has been authorized for detection and/or diagnosis of SARS-CoV-2 by FDA under an Emergency Use Authorization (EUA). This EUA will remain  in effect (meaning this test can be used) for the duration of the COVID-19 declaration under Section 564(b)(1) of the Act, 21 U.S.C.section 360bbb-3(b)(1), unless the authorization is terminated  or revoked sooner.       Influenza A by PCR NEGATIVE NEGATIVE Final   Influenza B by PCR NEGATIVE NEGATIVE Final    Comment: (NOTE) The Xpert Xpress SARS-CoV-2/FLU/RSV plus assay is intended as an aid in the diagnosis of influenza from Nasopharyngeal swab specimens and should not be used as a sole basis for treatment. Nasal washings and aspirates are unacceptable for Xpert Xpress SARS-CoV-2/FLU/RSV testing.  Fact Sheet for Patients: EntrepreneurPulse.com.au  Fact Sheet for Healthcare Providers: IncredibleEmployment.be  This test is not yet approved or cleared by the [place] FDA and has been authorized for detection  and/or diagnosis of SARS-CoV-2 by FDA under an Emergency Use Authorization (EUA). This EUA will remain in effect (meaning this test can be used) for the duration of the COVID-19 declaration under Section 564(b)(1) of the Act, 21 U.S.C. section 360bbb-3(b)(1), unless the authorization is terminated or revoked.  Performed at [facility], [address], [place], [place] [address]     Radiology Reports CT Abdomen Pelvis W Contrast  Result Date: 06/03/2021 CLINICAL DATA:  Unintentional weight loss. Personal history ovarian and anal carcinoma. EXAM: CT ABDOMEN AND PELVIS WITH CONTRAST TECHNIQUE: Multidetector CT imaging of the abdomen and pelvis was performed using the standard protocol following bolus administration of intravenous contrast. CONTRAST:  50mL OMNIPAQUE IOHEXOL 350 MG/ML SOLN COMPARISON:  06/24/2020 FINDINGS: Lower Chest: No acute findings. Hepatobiliary: No hepatic masses identified. Gallbladder is unremarkable. No evidence of biliary ductal dilatation. Pancreas:  No mass or inflammatory changes. Spleen: Within normal limits in size and appearance. Adrenals/Urinary Tract: No masses identified. No evidence of ureteral calculi or hydronephrosis. Pelvic floor laxity with moderate cystocele noted. Stomach/Bowel: 3 cm diverticulum again seen arising from the posterior gastric fundus. No evidence of bowel obstruction, inflammatory process or abnormal fluid collections. Vascular/Lymphatic: No pathologically enlarged lymph nodes. No acute vascular findings. Aortic atherosclerotic calcification noted. Reproductive: Prior hysterectomy noted. Adnexal regions are unremarkable in appearance. Other:  None. Musculoskeletal:  No suspicious bone lesions identified. IMPRESSION: No acute findings. No evidence of recurrent or metastatic carcinoma. Pelvic floor laxity with moderate cystocele. Stable 3 cm gastric fundal diverticulum. Aortic Atherosclerosis (ICD10-I70.0). Electronically Signed   By: Marlaine Hind  M.D.   On: 06/03/2021 11:01   DG Chest Port 1 View  Result Date: 06/28/2021 CLINICAL DATA:  Fatigue EXAM: PORTABLE CHEST 1 VIEW COMPARISON:  08/17/2014 FINDINGS: Right chest port with catheter tip in the right atrium. Normal cardiac and mediastinal contours. Aortic calcifications. No focal pulmonary opacity. No pleural effusion or pneumothorax. No acute osseous abnormality. IMPRESSION: No active disease. Electronically Signed   By: Merilyn Baba M.D.   On: 06/28/2021 12:24   ECHOCARDIOGRAM COMPLETE  Result Date: 06/14/2021    ECHOCARDIOGRAM REPORT   Patient Name:   Abigail Wagner  Date of Exam: 06/14/2021 Medical Rec #:  [id number]     Height:       65.0  in Accession #:    [id number]    Weight:       124.0 lb Date of Birth:  05-18-1951      BSA:          1.614 m Patient Age:    20 years      BP:           126/80 mmHg Patient Gender: F             HR:           60 bpm. Exam Location:  [place] Procedure: 2D Echo, Cardiac Doppler and Color Doppler Indications:    R06.00 Dyspnea  History:        Patient has no prior history of Echocardiogram examinations.                 Risk Factors:Hypertension and Dyslipidemia. Chronic kidney                 disease.  Sonographer:    Wilford Sports Rodgers-Jones RDCS Referring Phys: [place]  1. Left ventricular ejection fraction, by estimation, is 60 to 65%. The left ventricle has normal function. The left ventricle has no regional wall motion abnormalities. Left ventricular diastolic parameters are consistent with Grade II diastolic dysfunction (pseudonormalization).  2. Right ventricular systolic function is normal. The right ventricular size is mildly enlarged. There is normal pulmonary artery systolic pressure. The estimated right ventricular systolic pressure is 42.7 mmHg.  3. The mitral valve is normal in structure. Trivial mitral valve regurgitation. No evidence of mitral stenosis.  4. Tricuspid valve regurgitation is moderate.  5. The aortic valve  is tricuspid. Aortic valve regurgitation is trivial. Mild to moderate aortic valve sclerosis/calcification is present, without any evidence of aortic stenosis.  6. The inferior vena cava is normal in size with greater than 50% respiratory variability, suggesting right atrial pressure of 3 mmHg. FINDINGS  Left Ventricle: Left ventricular ejection fraction, by estimation, is 60 to 65%. The left ventricle has normal function. The left ventricle has no regional wall motion abnormalities. The left ventricular internal cavity size was normal in size. There is  no left ventricular hypertrophy. Left ventricular diastolic parameters are consistent with Grade II diastolic dysfunction (pseudonormalization). Right Ventricle: The right ventricular size is mildly enlarged. No increase in right ventricular wall thickness. Right ventricular systolic function is normal. There is normal pulmonary artery systolic pressure. The tricuspid regurgitant velocity is 2.42  m/s, and with an assumed right atrial pressure of 3 mmHg, the estimated right ventricular systolic pressure is 06.2 mmHg. Left Atrium: Left atrial size was normal in size. Right Atrium: Right atrial size was normal in size. Pericardium: There is no evidence of pericardial effusion. Mitral Valve: The mitral valve is normal in structure. Trivial mitral valve regurgitation. No evidence of mitral valve stenosis. Tricuspid Valve: The tricuspid valve is normal in structure. Tricuspid valve regurgitation is moderate . No evidence of tricuspid stenosis. Aortic Valve: The aortic valve is tricuspid. Aortic valve regurgitation is trivial. Mild to moderate aortic valve sclerosis/calcification is present, without any evidence of aortic stenosis. Pulmonic Valve: The pulmonic valve was normal in structure. Pulmonic valve regurgitation is not visualized. No evidence of pulmonic stenosis. Aorta: The aortic root is normal in size and structure. Venous: The inferior vena cava is normal in  size with greater than 50% respiratory variability, suggesting right atrial pressure of 3 mmHg. IAS/Shunts: No atrial level shunt detected by color flow Doppler.  LEFT VENTRICLE  PLAX 2D LVIDd:         4.70 cm  Diastology LVIDs:         2.60 cm  LV e' medial:    7.29 cm/s LV PW:         0.50 cm  LV E/e' medial:  15.5 LV IVS:        0.60 cm  LV e' lateral:   8.38 cm/s LVOT diam:     1.90 cm  LV E/e' lateral: 13.5 LV SV:         71 LV SV Index:   44 LVOT Area:     2.84 cm  RIGHT VENTRICLE             IVC RV Basal diam:  4.40 cm     IVC diam: 1.20 cm RV S prime:     14.95 cm/s TAPSE (M-mode): 2.7 cm LEFT ATRIUM             Index       RIGHT ATRIUM           Index LA diam:        4.80 cm 2.97 cm/m  RA Area:     12.50 cm LA Vol (A2C):   73.4 ml 45.46 ml/m RA Volume:   32.00 ml  19.82 ml/m LA Vol (A4C):   35.7 ml 22.11 ml/m LA Biplane Vol: 54.7 ml 33.88 ml/m  AORTIC VALVE LVOT Vmax:   116.00 cm/s LVOT Vmean:  73.500 cm/s LVOT VTI:    0.252 m  AORTA Ao Root diam: 3.10 cm Ao Asc diam:  3.50 cm MITRAL VALVE                TRICUSPID VALVE MV Area (PHT): 3.17 cm     TR Peak grad:   23.4 mmHg MV Decel Time: 239 msec     TR Vmax:        242.00 cm/s MV E velocity: 113.00 cm/s MV A velocity: 65.10 cm/s   SHUNTS MV E/A ratio:  1.74         Systemic VTI:  0.25 m                             Systemic Diam: 1.90 cm Candee Furbish MD Electronically signed by Candee Furbish MD Signature Date/Time: 06/14/2021/2:54:46 PM    Final      CBC Recent Labs  Lab 06/28/21 1154 06/29/21 0519  WBC 3.7* 3.2*  HGB 11.9* 11.3*  HCT 36.2 35.3*  PLT 185 158  MCV 97.8 98.9  MCH 32.2 31.7  MCHC 32.9 32.0  RDW 16.7* 16.9*  LYMPHSABS 1.1  --   MONOABS 0.3  --   EOSABS 0.1  --   BASOSABS 0.0  --     Chemistries  Recent Labs  Lab 06/28/21 1154 06/29/21 0519  NA 138 143  K 3.8 3.7  CL 102 112*  CO2 25 24  GLUCOSE 97 77  BUN 26* 19  CREATININE 1.10* 0.86  CALCIUM 8.7* 7.9*  MG  --  2.0  AST 27 23  ALT 14 11  ALKPHOS 43 37*   BILITOT 0.7 0.5   ------------------------------------------------------------------------------------------------------------------ Recent Labs    06/29/21 0519  CHOL 140  HDL 51  LDLCALC 74  TRIG 76  CHOLHDL 2.7    No results found for: HGBA1C ------------------------------------------------------------------------------------------------------------------ No results for input(s): TSH, T4TOTAL, T3FREE, THYROIDAB in the last 72 hours.  Invalid input(s):  FREET3 ------------------------------------------------------------------------------------------------------------------ No results for input(s): VITAMINB12, FOLATE, FERRITIN, TIBC, IRON, RETICCTPCT in the last 72 hours.  Coagulation profile No results for input(s): INR, PROTIME in the last 168 hours.  No results for input(s): DDIMER in the last 72 hours.  Cardiac Enzymes No results for input(s): CKMB, TROPONINI, MYOGLOBIN in the last 168 hours.  Invalid input(s): CK ------------------------------------------------------------------------------------------------------------------    Component Value Date/Time   BNP 379 (H) 06/03/2021 5366     Roxan Hockey M.D on 06/29/2021 at 2:54 PM  Go to [email or web address] - for contact info  [facility] - Office  [phone]

## 2021-06-30 ENCOUNTER — Other Ambulatory Visit: Payer: Self-pay

## 2021-06-30 DIAGNOSIS — I4891 Unspecified atrial fibrillation: Secondary | ICD-10-CM | POA: Diagnosis not present

## 2021-06-30 LAB — CBC
HCT: 33 % — ABNORMAL LOW (ref 36.0–46.0)
Hemoglobin: 10.5 g/dL — ABNORMAL LOW (ref 12.0–15.0)
MCH: 31.5 pg (ref 26.0–34.0)
MCHC: 31.8 g/dL (ref 30.0–36.0)
MCV: 99.1 fL (ref 80.0–100.0)
Platelets: 147 10*3/uL — ABNORMAL LOW (ref 150–400)
RBC: 3.33 MIL/uL — ABNORMAL LOW (ref 3.87–5.11)
RDW: 17 % — ABNORMAL HIGH (ref 11.5–15.5)
WBC: 3.2 10*3/uL — ABNORMAL LOW (ref 4.0–10.5)
nRBC: 0 % (ref 0.0–0.2)

## 2021-06-30 LAB — MRSA NEXT GEN BY PCR, NASAL: MRSA by PCR Next Gen: NOT DETECTED

## 2021-06-30 MED ORDER — AMIODARONE HCL 200 MG PO TABS
200.0000 mg | ORAL_TABLET | Freq: Two times a day (BID) | ORAL | Status: DC
  Administered 2021-06-30: 200 mg via ORAL
  Filled 2021-06-30: qty 1

## 2021-06-30 MED ORDER — APIXABAN 5 MG PO TABS: 5.0000 mg | ORAL_TABLET | Freq: Two times a day (BID) | ORAL | 5 refills | Status: DC

## 2021-06-30 MED ORDER — AMIODARONE HCL 200 MG PO TABS
200.0000 mg | ORAL_TABLET | ORAL | 1 refills | Status: DC
Start: 2021-06-30 — End: 2021-09-13

## 2021-06-30 MED ORDER — ROSUVASTATIN CALCIUM 20 MG PO TABS
20.0000 mg | ORAL_TABLET | Freq: Every day | ORAL | 3 refills | Status: DC
Start: 2021-06-30 — End: 2022-09-11

## 2021-06-30 MED ORDER — FUROSEMIDE 40 MG PO TABS: 40.0000 mg | ORAL_TABLET | Freq: Every day | ORAL | 3 refills | Status: DC

## 2021-06-30 MED ORDER — NICOTINE 14 MG/24HR TD PT24: 14.0000 mg | MEDICATED_PATCH | Freq: Every day | TRANSDERMAL | 0 refills | Status: DC

## 2021-06-30 NOTE — Progress Notes (Signed)
Nsg Discharge Note  Admit Date:  06/28/2021 Discharge date: 06/30/2021   Abigail Wagner to be D/C'd Home per MD order.  AVS completed.  Copy for chart, and copy for patient signed, and dated. Patient/caregiver able to verbalize understanding.  Discharge Medication: Allergies as of 06/30/2021       Reactions   Morphine And Related    Delirium with sbo        Medication List     STOP taking these medications    BC Fast Pain Relief Arthritis 1000-65 MG Pack Generic drug: Aspirin-Caffeine   FLUoxetine 20 MG capsule Commonly known as: PROZAC       TAKE these medications    amiodarone 200 MG tablet Commonly known as: PACERONE Take 1 tablet (200 mg total) by mouth See admin instructions. Amiodarone 1 Tablet 200 mg Twice Daily x 7 days, then 1 tab (200 mg) Daily after that   apixaban 5 MG Tabs tablet Commonly known as: ELIQUIS Take 1 tablet (5 mg total) by mouth 2 (two) times daily.   b complex vitamins tablet Take 1 tablet by mouth daily.   Co Q 10 100 MG Caps Take 100 mg by mouth daily.   divalproex 500 MG 24 hr tablet Commonly known as: Depakote ER Take 1 tablet (500 mg total) by mouth daily.   furosemide 40 MG tablet Commonly known as: LASIX Take 1 tablet (40 mg total) by mouth daily.   HAIR SKIN & NAILS ADVANCED PO Take by mouth.   HYDROcodone-acetaminophen 5-325 MG tablet Commonly known as: Norco Take 1 tablet by mouth every 6 (six) hours as needed for moderate pain.   KRILL OIL PO Take 350 mg by mouth daily.   LORazepam 0.5 MG tablet Commonly known as: Ativan Take 1 tablet (0.5 mg total) by mouth 3 (three) times daily as needed for anxiety.   multivitamin tablet Take 1 tablet by mouth daily.   nicotine 14 mg/24hr patch Commonly known as: NICODERM CQ - dosed in mg/24 hours Place 1 patch (14 mg total) onto the skin daily. Start taking on: July 01, 2021   omeprazole 40 MG capsule Commonly known as: PRILOSEC Take 1 capsule (40 mg total) by mouth  daily.   ondansetron 4 MG tablet Commonly known as: ZOFRAN TAKE 1 TABLET BY MOUTH EVERY 8 HOURS AS NEEDED FOR NAUSEA AND VOMITING What changed: See the new instructions.   OVER THE COUNTER MEDICATION Take 1 tablet by mouth in the morning and at bedtime. Patient reports that she is taking a ViViscal Tablet by mouth twice daily for hair loss.   polyethylene glycol 17 g packet Commonly known as: MIRALAX / GLYCOLAX Take 17 g by mouth daily. What changed:  when to take this reasons to take this   rosuvastatin 20 MG tablet Commonly known as: CRESTOR Take 1 tablet (20 mg total) by mouth daily.   Vitamin D-3 125 MCG (5000 UT) Tabs Take 5,000 Units by mouth daily.        Discharge Assessment: Vitals:   06/30/21 1000 06/30/21 1100  BP: 140/64 138/66  Pulse: 66 62  Resp: 16 16  Temp:  98.4 F (36.9 C)  SpO2: 97% 95%   Skin clean, dry and intact without evidence of skin break down, no evidence of skin tears noted. IV catheter discontinued intact. Site without signs and symptoms of complications - no redness or edema noted at insertion site, patient denies c/o pain - only slight tenderness at site.  Dressing with slight  pressure applied.  D/c Instructions-Education: Discharge instructions given to patient/family with verbalized understanding. D/c education completed with patient/family including follow up instructions, medication list, d/c activities limitations if indicated, with other d/c instructions as indicated by MD - patient able to verbalize understanding, all questions fully answered. Patient instructed to return to ED, call 911, or call MD for any changes in condition.  Patient escorted via [place], and D/C home via private auto.  Carney Corners, RN 06/30/2021 11:39 AM

## 2021-06-30 NOTE — Discharge Instructions (Signed)
1)You are taking Apixaban/Eliquis so please- Avoid ibuprofen/Advil/Aleve/Motrin/Goody Powders/Naproxen/BC powders/Meloxicam/Diclofenac/Indomethacin and other Nonsteroidal anti-inflammatory medications as these will make you more likely to bleed and can cause stomach ulcers, can also cause Kidney problems.   2)Please Take Amiodarone 1 Tablet 200 mg Twice Daily x 7 days, then 1 tab (200 mg) Daily after that  3) please keep your appointment with a cardiologist at the atrial fibrillation clinic  4) complete abstinence from tobacco and reduction in alcohol intake advised

## 2021-06-30 NOTE — Evaluation (Signed)
Physical Therapy Evaluation Patient Details Name: Abigail Wagner MRN: [id number] DOB: 06-Feb-1951 Today's Date: 06/30/2021  History of Present Illness  Abigail Wagner is a 70 y.o. female with medical history significant of htn, bipolar, anxiety, gerd, multiple cancers(ovarian, rectal), ibs. She reports several weeks of 'feeling poorly', weakness. She reports several weeks ago, she was started on $RemoveBe'40mg'XkiPykcGD$  lasix po daily for lower extremity edema, and she did not increase her daily water/fluid intake. She reports she drinks 2-3 1L bottles daily. She reports weakness when standing, including a fall when standing from bed 3 weeks ago approxiamtely, striking her head, without loss of consciousness, without nausea or vomiting, without any alteration to her mental status, while not anticoagulated. She reports recently 20+ lbs weight loss they (pt and husband) attribute to fluid loss from lasix. She endorses rare night sweats, denies fever. She reports taking 3x bc powders daily, possibly 1g aspirin and $RemoveBef'65mg'VMqeMkYDjF$  caffeine per dose. She denies dark stools or blood in stools. She does have conjunctival pallor and a hemoglobin of 11.9 currently, increased rdw. She was found today, 10/4, to have afib, with a rate of 120's upon visiting her pcp, who then referred her to come to the er.   Clinical Impression  Patient functioning near baseline for functional mobility and gait requiring Min guard hand held assist when walking in room/hallway, instructed in use of SPC with fair/good carryover with mostly step-through pattern without loss of balance and tolerated sitting up in chair after therapy.  Plan:  Patient discharged from physical therapy to care of nursing for ambulation daily as tolerated for length of stay.         Recommendations for follow up therapy are one component of a multi-disciplinary discharge planning process, led by the attending physician.  Recommendations may be updated based on patient status, additional  functional criteria and insurance authorization.  Follow Up Recommendations Home health PT;Supervision for mobility/OOB;Supervision - Intermittent    Equipment Recommendations  Cane    Recommendations for Other Services       Precautions / Restrictions Precautions Precautions: Fall Restrictions Weight Bearing Restrictions: No      Mobility  Bed Mobility Overal bed mobility: Modified Independent                  Transfers Overall transfer level: Needs assistance Equipment used: Straight cane Transfers: Sit to/from Stand;Stand Pivot Transfers Sit to Stand: Supervision;Min guard Stand pivot transfers: Supervision;Min guard       General transfer comment: see OT notes  Ambulation/Gait Ambulation/Gait assistance: Supervision Gait Distance (Feet): 100 Feet Assistive device: None;Straight cane Gait Pattern/deviations: Decreased step length - right;Decreased step length - left;Decreased stride length Gait velocity: decreased   General Gait Details: slightly labored cadence requiring hand held assist or leaning on nearby objects for support when not using an AD, safer using SPC with good carryover demonstrated with mostly 2 point gait pattern, no loss of balance  Stairs            Wheelchair Mobility    Modified Rankin (Stroke Patients Only)       Balance Overall balance assessment: Mild deficits observed, not formally tested                                           Pertinent Vitals/Pain Pain Assessment: No/denies pain    Home Living Family/patient expects to be discharged to::  Private residence Living Arrangements: Spouse/significant other Available Help at Discharge: Available 24 hours/day Type of Home: House Home Access: Stairs to enter Entrance Stairs-Rails: None Entrance Stairs-Number of Steps: 3 Home Layout: Two level Home Equipment: Shower seat;Grab bars - tub/shower;Grab bars - toilet      Prior Function Level of  Independence: Needs assistance   Gait / Transfers Assistance Needed: Household ambulator usually leaning on walls and furniture, spouse assist when going up/down steps  ADL's / Homemaking Assistance Needed: Pt is indepndent for ADL's and assist by husband for IADLs        Hand Dominance   Dominant Hand: Right    Extremity/Trunk Assessment   Upper Extremity Assessment Upper Extremity Assessment: Defer to OT evaluation    Lower Extremity Assessment Lower Extremity Assessment: Generalized weakness    Cervical / Trunk Assessment Cervical / Trunk Assessment: Normal  Communication   Communication: No difficulties  Cognition Arousal/Alertness: Awake/alert Behavior During Therapy: WFL for tasks assessed/performed Overall Cognitive Status: Within Functional Limits for tasks assessed                                        General Comments      Exercises     Assessment/Plan    PT Assessment All further PT needs can be met in the next venue of care  PT Problem List Decreased strength;Decreased activity tolerance;Decreased balance;Decreased mobility       PT Treatment Interventions      PT Goals (Current goals can be found in the Care Plan section)  Acute Rehab PT Goals Patient Stated Goal: return home with family to assist PT Goal Formulation: With patient Time For Goal Achievement: 06/30/21 Potential to Achieve Goals: Good    Frequency     Barriers to discharge        Co-evaluation PT/OT/SLP Co-Evaluation/Treatment: Yes Reason for Co-Treatment: To address functional/ADL transfers PT goals addressed during session: Mobility/safety with mobility;Proper use of DME;Balance OT goals addressed during session: ADL's and self-care       AM-PAC PT "6 Clicks" Mobility  Outcome Measure Help needed turning from your back to your side while in a flat bed without using bedrails?: None Help needed moving from lying on your back to sitting on the side of a  flat bed without using bedrails?: None Help needed moving to and from a bed to a chair (including a wheelchair)?: A Little Help needed standing up from a chair using your arms (e.g., wheelchair or bedside chair)?: A Little Help needed to walk in hospital room?: A Little Help needed climbing 3-5 steps with a railing? : A Little 6 Click Score: 20    End of Session   Activity Tolerance: Patient tolerated treatment well;Patient limited by fatigue Patient left: in chair;with call bell/phone within reach Nurse Communication: Mobility status PT Visit Diagnosis: Unsteadiness on feet (R26.81);Other abnormalities of gait and mobility (R26.89);Muscle weakness (generalized) (M62.81)    Time: 7948-0165 PT Time Calculation (min) (ACUTE ONLY): 28 min   Charges:   PT Evaluation $PT Eval Moderate Complexity: 1 Mod PT Treatments $Therapeutic Activity: 23-37 mins        11:18 AM, 06/30/21 Lonell Grandchild, MPT Physical Therapist with [facility] [phone] office [phone] mobile phone

## 2021-06-30 NOTE — TOC Transition Note (Signed)
Transition of Care [facility]) - CM/SW Discharge Note   Patient Details  Name: Abigail Wagner MRN: [id number] Date of Birth: 08-20-1951  [facility]) CM/SW Contact:  [name], LCSW Phone Number: 06/30/2021, 9:52 AM   Clinical Narrative:     Pt stable for dc home today per MD. PT/OT recommending HH at dc. Spoke with pt today to review therapy recommendations and dc planning. Pt reports that she plans to return home with her husband at dc and that he will take care of her. Pt states that she does not want a referral to [facility] at this time. Pt aware that if she changes her mind after dc from the hospital, she can contact her PCP to request an order. Also discussed PT recommendation for a single point care for use at home. Provided pt with options for obtaining one and she states she will have her husband pick one up for her.  Pt does not identify any other TOC needs for dc.  Expected Discharge Plan: Home/Self Care Barriers to Discharge: Barriers Resolved   Patient Goals and CMS Choice Patient states their goals for this hospitalization and ongoing recovery are:: return home      Expected Discharge Plan and Services Expected Discharge Plan: Home/Self Care In-house Referral: Clinical Social Work     Living arrangements for the past 2 months: Single Family Home Expected Discharge Date: 06/30/21                                    Prior Living Arrangements/Services Living arrangements for the past 2 months: Single Family Home Lives with:: Spouse Patient language and need for interpreter reviewed:: Yes Do you feel safe going back to the place where you live?: Yes      Need for Family Participation in Patient Care: Yes (Comment) Care giver support system in place?: Yes (comment)   Criminal Activity/Legal Involvement Pertinent to Current Situation/Hospitalization: No - Comment as needed  Activities of Daily Living Home Assistive Devices/Equipment: Eyeglasses ADL  Screening (condition at time of admission) Patient's cognitive ability adequate to safely complete daily activities?: Yes Is the patient deaf or have difficulty hearing?: No Does the patient have difficulty seeing, even when wearing glasses/contacts?: No Does the patient have difficulty concentrating, remembering, or making decisions?: No Patient able to express need for assistance with ADLs?: Yes Does the patient have difficulty dressing or bathing?: No Independently performs ADLs?: Yes (appropriate for developmental age) Does the patient have difficulty walking or climbing stairs?: Yes Weakness of Legs: Both Weakness of Arms/Hands: None  Permission Sought/Granted                  Emotional Assessment Appearance:: Appears stated age Attitude/Demeanor/Rapport: Engaged Affect (typically observed): Pleasant Orientation: : Oriented to Self, Oriented to Place, Oriented to  Time, Oriented to Situation Alcohol / Substance Use: Not Applicable Psych Involvement: No (comment)  Admission diagnosis:  Irregular heart beat [I49.9] Atrial fibrillation with RVR ([place]) [I48.91] Patient Active Problem List   Diagnosis Date Noted   Atrial fibrillation with RVR ([place]) 06/28/2021   Recurrent Diverticulitis 06/28/2021   Poor appetite 06/28/2021   AKI (acute kidney injury) ([place]) 06/28/2021   Chronic diarrhea 10/06/2020   Chronic prescription opiate use 10/06/2020   SBO (small bowel obstruction) ([place]) 05/25/2020   Vulvar irritation 03/10/2020   Internal carotid artery stenosis, left    Neural foraminal stenosis of lumbar spine 05/16/2018  Genetic testing 05/24/2017   Vaginal infection 09/27/2016   Essential hypertension 09/27/2016   GERD (gastroesophageal reflux disease) 09/27/2016   Back pain 09/27/2016   Anxiety 09/27/2016   Allergy 09/27/2016   Radiation proctitis 12/23/2015   Abnormal weight loss 12/15/2015   Anal cancer ([place]) 11/19/2015   Dehydration 11/10/2015   History of ovarian  cancer 08/03/2015   Prolapse of vaginal vault after hysterectomy 07/20/2015   Port-A-Cath in place 01/13/2015   Nausea with vomiting 10/23/2014   Epithelial ovarian cancer, FIGO stage IIIA ([place]) 07/16/2014   Abdominal carcinomatosis ([place]) 06/18/2014   Bipolar 1 disorder ([place]) 12/27/2011   Kidney stones 12/27/2011   Depression 11/07/2011   PCP:  Susy Frizzle, MD Pharmacy:   [id number] - [place], [address] [place] [address] Phone: [phone] Fax: [phone]     Social Determinants of Health (SDOH) Interventions    Readmission Risk Interventions Readmission Risk Prevention Plan 06/30/2021 06/30/2021  Transportation Screening - Complete  Home Care Screening Patient refused Complete  Medication Review (RN CM) - Complete  Some recent data might be hidden     Final next level of care: Home/Self Care Barriers to Discharge: Barriers Resolved   Patient Goals and CMS Choice Patient states their goals for this hospitalization and ongoing recovery are:: return home      Discharge Placement                       Discharge Plan and Services In-house Referral: Clinical Social Work                                   Social Determinants of Health (SDOH) Interventions     Readmission Risk Interventions Readmission Risk Prevention Plan 06/30/2021 06/30/2021  Transportation Screening - Complete  Home Care Screening Patient refused Complete  Medication Review (RN CM) - Complete  Some recent data might be hidden

## 2021-06-30 NOTE — Progress Notes (Signed)
Progress Note  Patient Name: Abigail Wagner Date of Encounter: 06/30/2021  Primary Cardiologist: New to [place] better today.  No palpitations or shortness of breath, no chest pain.  Inpatient Medications    Scheduled Meds:  apixaban  5 mg Oral BID   Chlorhexidine Gluconate Cloth  6 each Topical Daily   divalproex  500 mg Oral Daily   feeding supplement  237 mL Oral BID BM   FLUoxetine  20 mg Oral q morning   folic acid  1 mg Oral Daily   multivitamin with minerals  1 tablet Oral Daily   nicotine  14 mg Transdermal Daily   pantoprazole  40 mg Oral BID   rosuvastatin  20 mg Oral QPM   thiamine  100 mg Oral Daily   Or   thiamine  100 mg Intravenous Daily   Continuous Infusions:  sodium chloride 50 mL/hr at 06/30/21 0259   amiodarone Stopped (06/30/21 0151)   PRN Meds: acetaminophen, HYDROcodone-acetaminophen, LORazepam **OR** LORazepam, LORazepam, ondansetron (ZOFRAN) IV, polyethylene glycol, zolpidem   Vital Signs    Vitals:   06/30/21 0355 06/30/21 0547 06/30/21 0600 06/30/21 0700  BP: 128/69  140/63   Pulse: (!) 58  62   Resp: 19     Temp: 98 F (36.7 C)   98.2 F (36.8 C)  TempSrc: Oral   Oral  SpO2: 99%     Weight:  56.1 kg    Height:        Intake/Output Summary (Last 24 hours) at 06/30/2021 0827 Last data filed at 06/30/2021 0700 Gross per 24 hour  Intake 720 ml  Output 700 ml  Net 20 ml   Filed Weights   06/28/21 1902 06/29/21 0445 06/30/21 0547  Weight: 53.3 kg 53.9 kg 56.1 kg    Telemetry    Sinus rhythm.  Personally reviewed.  ECG    An ECG dated 06/30/2021 was personally reviewed today and demonstrated:  Sinus rhythm with PACs.  Physical Exam   GEN: No acute distress.   Neck: No JVD. Cardiac: RRR, no gallop.  Respiratory: Nonlabored. Clear to auscultation bilaterally. GI: Soft, nontender, bowel sounds present. MS: No edema.  Labs    Chemistry Recent Labs  Lab 06/28/21 1154 06/29/21 0519  NA 138 143  K 3.8  3.7  CL 102 112*  CO2 25 24  GLUCOSE 97 77  BUN 26* 19  CREATININE 1.10* 0.86  CALCIUM 8.7* 7.9*  PROT 6.6 5.4*  ALBUMIN 3.9 3.1*  AST 27 23  ALT 14 11  ALKPHOS 43 37*  BILITOT 0.7 0.5  GFRNONAA 54* >60  ANIONGAP 11 7     Hematology Recent Labs  Lab 06/28/21 1154 06/29/21 0519 06/30/21 0442  WBC 3.7* 3.2* 3.2*  RBC 3.70* 3.57* 3.33*  HGB 11.9* 11.3* 10.5*  HCT 36.2 35.3* 33.0*  MCV 97.8 98.9 99.1  MCH 32.2 31.7 31.5  MCHC 32.9 32.0 31.8  RDW 16.7* 16.9* 17.0*  PLT 185 158 147*    Radiology    MR BRAIN WO CONTRAST  Result Date: 06/29/2021 CLINICAL DATA:  Neuro deficit, acute, stroke suspected EXAM: MRI HEAD WITHOUT CONTRAST TECHNIQUE: Multiplanar, multiecho pulse sequences of the brain and surrounding structures were obtained without intravenous contrast. COMPARISON:  None. FINDINGS: Brain: No acute infarction, hemorrhage, hydrocephalus, extra-axial collection or mass lesion. T2 hyperintense signal in the periventricular white matter, likely the sequela of chronic small vessel ischemic disease. Vascular: Normal flow voids. Skull and upper cervical  spine: Normal marrow signal. Sinuses/Orbits: Minimal mucosal thickening in the ethmoid air cells. The orbits are unremarkable. Other: Trace fluid in the mastoid air cells. IMPRESSION: No acute intracranial process. Electronically Signed   By: Merilyn Baba M.D.   On: 06/29/2021 15:00   US Carotid Bilateral  Result Date: 06/29/2021 CLINICAL DATA:  70 year old female with a history of syncope EXAM: BILATERAL CAROTID DUPLEX ULTRASOUND TECHNIQUE: Pearline Cables scale imaging, color Doppler and duplex ultrasound were performed of bilateral carotid and vertebral arteries in the neck. COMPARISON:  None. FINDINGS: Criteria: Quantification of carotid stenosis is based on velocity parameters that correlate the residual internal carotid diameter with NASCET-based stenosis levels, using the diameter of the distal internal carotid lumen as the denominator  for stenosis measurement. The following velocity measurements were obtained: RIGHT ICA:  Systolic 124 cm/sec, Diastolic 29 cm/sec CCA:  66 cm/sec SYSTOLIC ICA/CCA RATIO:  1.6 ECA:  107 cm/sec LEFT ICA:  Systolic 72 cm/sec, Diastolic 20 cm/sec CCA:  59 cm/sec SYSTOLIC ICA/CCA RATIO:  1.2 ECA:  129 cm/sec Right Brachial SBP: Not acquired Left Brachial SBP: Not acquired RIGHT CAROTID ARTERY: No significant calcifications of the right common carotid artery. Intermediate waveform maintained. Moderate heterogeneous and partially calcified plaque at the right carotid bifurcation. Lumen shadowing. Low resistance waveform of the right ICA. No significant tortuosity. RIGHT VERTEBRAL ARTERY: Antegrade flow with low resistance waveform. LEFT CAROTID ARTERY: No significant calcifications of the left common carotid artery. Intermediate waveform maintained. Moderate heterogeneous and partially calcified plaque at the left carotid bifurcation. Lumen shadowing. Low resistance waveform of the left ICA. No significant tortuosity. LEFT VERTEBRAL ARTERY: Antegrade flow. Low resistance waveform with parvus tardus waveform IMPRESSION: Color duplex indicates moderate heterogeneous and calcified plaque, with no hemodynamically significant stenosis by duplex criteria in the extracranial cerebrovascular circulation. Signed, Dulcy Fanny. Dellia Nims, RPVI [facility] Radiology Electronically Signed   By: Corrie Mckusick D.O.   On: 06/29/2021 16:10   DG Chest Port 1 View  Result Date: 06/28/2021 CLINICAL DATA:  Fatigue EXAM: PORTABLE CHEST 1 VIEW COMPARISON:  08/17/2014 FINDINGS: Right chest port with catheter tip in the right atrium. Normal cardiac and mediastinal contours. Aortic calcifications. No focal pulmonary opacity. No pleural effusion or pneumothorax. No acute osseous abnormality. IMPRESSION: No active disease. Electronically Signed   By: Merilyn Baba M.D.   On: 06/28/2021 12:24     Cardiac Studies   Echocardiogram 06/14/2021:  1. Left ventricular ejection fraction, by estimation, is 60 to 65%. The  left ventricle has normal function. The left ventricle has no regional  wall motion abnormalities. Left ventricular diastolic parameters are  consistent with Grade II diastolic  dysfunction (pseudonormalization).   2. Right ventricular systolic function is normal. The right ventricular  size is mildly enlarged. There is normal pulmonary artery systolic  pressure. The estimated right ventricular systolic pressure is 58.0 mmHg.   3. The mitral valve is normal in structure. Trivial mitral valve  regurgitation. No evidence of mitral stenosis.   4. Tricuspid valve regurgitation is moderate.   5. The aortic valve is tricuspid. Aortic valve regurgitation is trivial.  Mild to moderate aortic valve sclerosis/calcification is present, without  any evidence of aortic stenosis.   6. The inferior vena cava is normal in size with greater than 50%  respiratory variability, suggesting right atrial pressure of 3 mmHg.   Assessment & Plan    1.  Newly documented persistent atrial fibrillation presenting with RVR, CHA2DS2-VASc score is at least 4.  She has converted to sinus rhythm on IV amiodarone which was discontinued overnight.  Also on Eliquis for stroke prophylaxis.  Recent echocardiogram shows LVEF 60 to 65% with normal left atrial chamber size.  TSH normal.  Brain MRI yesterday did not show any acute events.  She has moderate nonobstructive ICA atherosclerosis.  Blood pressure is stable.  2.  History of hypertension, initially low blood pressures when in atrial fibrillation, recent systolics 431-427.  No further inpatient cardiac work-up planned at this time given improvement in clinical status and recent testing.  Continue Eliquis for stroke prophylaxis.  Recommend starting amiodarone 200 mg twice daily for 7 days then reduce to once daily thereafter.  She remains at risk for  recurrent atrial fibrillation, and since she did not tolerate this well we will make further efforts at rhythm suppression.  Stable for discharge from a cardiac perspective.  We will schedule outpatient follow-up in the cardiology clinic.  Signed, Rozann Lesches, MD  06/30/2021, 8:27 AM

## 2021-06-30 NOTE — Evaluation (Signed)
Occupational Therapy Evaluation Patient Details Name: Abigail Wagner MRN: [id number] DOB: 31-Mar-1951 Today's Date: 06/30/2021   History of Present Illness Abigail Wagner is a 70 y.o. female with medical history significant of htn, bipolar, anxiety, gerd, multiple cancers(ovarian, rectal), ibs. She reports several weeks of 'feeling poorly', weakness. She reports several weeks ago, she was started on 40mg  lasix po daily for lower extremity edema, and she did not increase her daily water/fluid intake. She reports she drinks 2-3 1L bottles daily. She reports weakness when standing, including a fall when standing from bed 3 weeks ago approxiamtely, striking her head, without loss of consciousness, without nausea or vomiting, without any alteration to her mental status, while not anticoagulated. She reports recently 20+ lbs weight loss they (pt and husband) attribute to fluid loss from lasix. She endorses rare night sweats, denies fever. She reports taking 3x bc powders daily, possibly 1g aspirin and 65mg  caffeine per dose. She denies dark stools or blood in stools. She does have conjunctival pallor and a hemoglobin of 11.9 currently, increased rdw. She was found today, 10/4, to have afib, with a rate of 120's upon visiting her pcp, who then referred her to come to the er.   Clinical Impression   Pt agreeable to OT/PT co-evaluation. Pt demonstrates Mod I bed mobility and independent ability to adjust socks while seated at EOB. Pt demonstrates WFL B UE strength and ROM. Pt required SPV to min G assist without cane, more SPV assist with cane. Pt appears to be operating at or near baseline levels and is not recommended for further acute OT services. Pt will be discharged to care of nursing staff for the remaining length of stay.      Recommendations for follow up therapy are one component of a multi-disciplinary discharge planning process, led by the attending physician.  Recommendations may be updated based on  patient status, additional functional criteria and insurance authorization.   Follow Up Recommendations  Supervision - Intermittent;No OT follow up    Equipment Recommendations  None recommended by OT           Precautions / Restrictions Precautions Precautions: Fall Restrictions Weight Bearing Restrictions: No      Mobility Bed Mobility Overal bed mobility: Modified Independent                  Transfers Overall transfer level: Needs assistance Equipment used: Straight cane Transfers: Sit to/from Stand;Stand Pivot Transfers Sit to Stand: Supervision;Min guard Stand pivot transfers: Supervision;Min guard       General transfer comment: Pt unsteady at times but able to complete trasnfers with SPV to min G assist. Improved balance with cane.    Balance Overall balance assessment: Mild deficits observed, not formally tested (Improved with use of cane.)                                         ADL either performed or assessed with clinical judgement   ADL Overall ADL's : Needs assistance/impaired                     Lower Body Dressing: Independent;Sitting/lateral leans Lower Body Dressing Details (indicate cue type and reason): Pt able to fix socks while seated at EOB. Toilet Transfer: Supervision/safety;Min guard;Ambulation Toilet Transfer Details (indicate cue type and reason): simulated via ambulatory trasnfer from bed to chair.  Vision Baseline Vision/History: 1 Wears glasses (reading) Ability to See in Adequate Light: 0 Adequate Patient Visual Report: No change from baseline Vision Assessment?: No apparent visual deficits (per observation)       Hand Dominance Right   Extremity/Trunk Assessment Upper Extremity Assessment Upper Extremity Assessment: Overall WFL for tasks assessed   Lower Extremity Assessment Lower Extremity Assessment: Defer to PT evaluation   Cervical / Trunk Assessment Cervical /  Trunk Assessment: Normal   Communication Communication Communication: No difficulties   Cognition Arousal/Alertness: Awake/alert Behavior During Therapy: WFL for tasks assessed/performed Overall Cognitive Status: Within Functional Limits for tasks assessed                                                Home Living Family/patient expects to be discharged to:: Private residence Living Arrangements: Spouse/significant other Available Help at Discharge: Available 24 hours/day Type of Home: House Home Access: Stairs to enter Technical brewer of Steps: 3 Entrance Stairs-Rails: None Home Layout: Two level Alternate Level Stairs-Number of Steps: 12 Alternate Level Stairs-Rails:  (pt does not go to 2nd level) Bathroom Shower/Tub: Teacher, early years/pre: Handicapped height Bathroom Accessibility: Yes   Home Equipment: Shower seat;Grab bars - tub/shower;Grab bars - toilet          Prior Functioning/Environment Level of Independence: Needs assistance  Gait / Transfers Assistance Needed: Pt reports household ambulation with assist from husband as needed. ADL's / Homemaking Assistance Needed: Pt is indepndent for ADL's and assist by husband for IADLs                          OT Goals(Current goals can be found in the care plan section) Acute Rehab OT Goals Patient Stated Goal: return home                   Co-evaluation PT/OT/SLP Co-Evaluation/Treatment: Yes Reason for Co-Treatment: To address functional/ADL transfers   OT goals addressed during session: ADL's and self-care                       End of Session Equipment Utilized During Treatment:  (cane)  Activity Tolerance: Patient tolerated treatment well Patient left: in chair;with call bell/phone within reach  OT Visit Diagnosis: Unsteadiness on feet (R26.81);Other abnormalities of gait and mobility (R26.89)                Time: 2595-6387 OT Time Calculation  (min): 18 min Charges:  OT General Charges $OT Visit: 1 Visit OT Evaluation $OT Eval Low Complexity: 1 Low  Tyrel Lex OT, MOT  Larey Seat 06/30/2021, 9:58 AM

## 2021-06-30 NOTE — Discharge Summary (Signed)
Abigail Wagner, is a 70 y.o. female  DOB 1951/08/26  MRN [id number].  Admission date:  06/28/2021  Admitting Physician  Murlean Iba, MD  Discharge Date:  06/30/2021   Primary MD  Susy Frizzle, MD  Recommendations for primary care physician for things to follow:   1)You are taking Apixaban/Eliquis so please- Avoid ibuprofen/Advil/Aleve/Motrin/Goody Powders/Naproxen/BC powders/Meloxicam/Diclofenac/Indomethacin and other Nonsteroidal anti-inflammatory medications as these will make you more likely to bleed and can cause stomach ulcers, can also cause Kidney problems.   2)Please Take Amiodarone 1 Tablet 200 mg Twice Daily x 7 days, then 1 tab (200 mg) Daily after that  3) please keep your appointment with a cardiologist at the atrial fibrillation clinic  4) complete abstinence from tobacco and reduction in alcohol intake advised   Admission Diagnosis  Irregular heart beat [I49.9] Atrial fibrillation with RVR ([place]) [I48.91]   Discharge Diagnosis  Irregular heart beat [I49.9] Atrial fibrillation with RVR ([place]) [I48.91]    Principal Problem:   Atrial fibrillation with RVR (HCC) Active Problems:   Bipolar 1 disorder (HCC)   Epithelial ovarian cancer, FIGO stage IIIA (HCC)   Abnormal weight loss   Essential hypertension   GERD (gastroesophageal reflux disease)   Dehydration   Anxiety   Internal carotid artery stenosis, left   Chronic diarrhea   Chronic prescription opiate use   Recurrent Diverticulitis   Poor appetite   AKI (acute kidney injury) ([place])      Past Medical History:  Diagnosis Date   Anal carcinoma (HCC)    Chemo and radiation   Anemia    Anxiety    Dr. Harrington Challenger at [place] (psychiatrist).     Carotid artery disease (HCC)    DDD (degenerative disc disease), lumbosacral    GERD (gastroesophageal reflux disease)    Headache(784.0)    History of adenomatous polyp of colon  10/07/2015   Tubular adenoma high grade dysplasia   History of herpes genitalis    History of kidney stones    History of suicide attempt 2010   HTN (hypertension)    Hyperlipidemia    IBS (irritable bowel syndrome)    Major depression    OA (osteoarthritis)    Ovarian cancer ([place])    Stage IIIB papillary ovarian carcinoma  s/p omentectomy and BSO & chemotherapy (completed 12-07-2014)   Prolapse of vaginal vault after hysterectomy 07/20/2015   Rectovaginal fistula 08/05/2015   Seasonal allergies    Sigmoid diverticulosis    Smokers' cough [facility])     Past Surgical History:  Procedure Laterality Date   BIOPSY  10/12/2020   Procedure: BIOPSY;  Surgeon: Harvel Quale, MD;  [facility];  Service: Gastroenterology;;   BREAST ENHANCEMENT [place]   COLONOSCOPY N/A 10/07/2015   Procedure: COLONOSCOPY;  Surgeon: Rogene Houston, MD;  [facility];  Service: Endoscopy;  Laterality: N/A;  7:30   COLONOSCOPY WITH PROPOFOL N/A 10/12/2020   Procedure: COLONOSCOPY WITH PROPOFOL;  Surgeon: Jenetta Downer  Roney Marion, MD;  [facility];  Service: Gastroenterology;  Laterality: N/A;  9:00   ESOPHAGOGASTRODUODENOSCOPY (EGD) WITH PROPOFOL N/A 10/12/2020   Procedure: ESOPHAGOGASTRODUODENOSCOPY (EGD) WITH PROPOFOL;  Surgeon: Harvel Quale, MD;  [facility];  Service: Gastroenterology;  Laterality: N/A;   EXPLORATORY LAPAROTOMY/ OMENTECTOMY/  BILATERAL SALPINGOOPHORECTOMY/  PORT-A-CATH PLACEMENT  07-03-2014   [place]   KNEE ARTHROSCOPY Left 09-22-2004   LAPAROSCOPY N/A 06/02/2014   Procedure: DIAGNOSTIC LAPAROSCOPY, OMENTAL BIOPSY, RIGHT OVARY BIOPSY, LYSIS OF ADHESIONS;  Surgeon: Fanny Skates, MD;  Location: [place];  Service: General;  Laterality: N/A;   MUCOSAL ADVANCEMENT FLAP N/A 06/15/2016   Procedure: EXCISION RECTOVAGINAOL FISTULA WITH MUCOSAL ADVANCEMENT FLAP;  Surgeon: Leighton Ruff, MD;  Location:  [facility];  Service: General;  Laterality: N/A;   [place] N/A 09/09/2015   Procedure: PLACEMENT OF SETON;  Surgeon: Leighton Ruff, MD;  Location[facility];  Service: General;  Laterality: N/A;   PORT-A-CATH REMOVAL Right 08/17/2014   Procedure: REMOVAL INTRAPERITONEAL CHEMO PORT;  Surgeon: Fanny Skates, MD;  Location: [place];  Service: General;  Laterality: Right;   PORTACATH PLACEMENT Right 08/17/2014   Procedure:  PLACE NEW PORT A CATH;  Surgeon: Fanny Skates, MD;  Location: [place];  Service: General;  Laterality: Right;   RECTAL BIOPSY N/A 09/09/2015   Procedure: BIOPSY OF RECTOVAGINAL MASS;  Surgeon: Leighton Ruff, MD;  Location: [place];  Service: General;  Laterality: N/A;   TUBAL LIGATION  YRS AGO   VAGINAL HYSTERECTOMY  1981   fibroids       HPI  from the history and physical done on the day of admission:  Chief Complaint: weakness/ new diagnosis of atrial fib   HPI: Abigail Wagner is a 70 y.o. female with medical history significant of htn, bipolar, anxiety, gerd, multiple cancers(ovarian, rectal), ibs. She reports several weeks of 'feeling poorly', weakness. She reports several weeks ago, she was started on 40mg  lasix po daily for lower extremity edema, and she did not increase her daily water/fluid intake. She reports she drinks 2-3 1L bottles daily. She reports weakness when standing, including a fall when standing from bed 3 weeks ago approxiamtely, striking her head, without loss of consciousness, without nausea or vomiting, without any alteration to her mental status, while not anticoagulated. She reports recently 20+ lbs weight loss they (pt and husband) attribute to fluid loss from lasix. She endorses rare night sweats, denies fever. She reports taking 3x bc powders daily, possibly 1g aspirin and 65mg  caffeine per dose. She denies dark stools or blood in stools. She does have  conjunctival pallor and a hemoglobin of 11.9 currently, increased rdw. She was found today, 10/4, to have afib, with a rate of 120's upon visiting her pcp, who then referred her to come to the er.   ED Course: labs as listed, fluid boluses after revealing poor skin turgor and current use of lasix without adjusted fluid intake. Cardizem was considered for rate control, with resulting hypotension, which was then discontinued, to replete fluid volume and re-assess rate after beginning to correct dehydration and AKI.   Patient has indwelling port       Hospital Course:      Brief Summary:- 70 y.o. female with medical history significant of htn, bipolar, anxiety, gerd, multiple cancers(ovarian, rectal), ibs admitted on 06/28/2021 with new onset atrial fibrillation of unknown duration     A/p 1) new  onset A. fib with RVR--diagnosed 06/28/2021 back the history could have been in A. fib for the past couple of weeks -BP improved with IV fluids, -Improved on IV amiodarone for rate control  -Patient was unable to tolerate iv Cardizem due to low BP -Echo from 06/14/2021 with EF of 60 to 65% with grade 2 diastolic dysfunction, without mitral stenosis and without atrial enlargement -Cardiology consult appreciated -CHA2DS2-VASc score is 4 continue Eliquis for stroke prophylaxis -TSH WNL -Discharged home on p.o. amiodarone for rhythm/rate control and Eliquis anticoagulation/stroke prophylaxis -Outpatient follow-up with cardiology advised   2)Dizziness/gait disturbance/confusional episodes/as well as episodes of speech disturbance  ----- as per the patient has been on and off for at least 4 weeks now-- -Could be related to underlying A. Fib--however need to rule out stroke -Denies frank syncope, could have had presyncopal episodes per report -Brain MRI carotid artery Dopplers without concerning findings specifically no acute stroke and no hemodynamically significant stenosis  recent echo as noted above  #1 -PT /OT eval appreciated recommend home health, but patient declines home health services at this time   3)Epithelial ovarian cancer, FIGO stage IIIA ([place]) -total hysterectomy -recent ct abd w contrast w/o notable findings   4) Nonobstructive carotid artery disease, last assessed by carotid Dopplers in September 2019.  She has been on Crestor as an outpatient. -Repeat carotid artery Dopplers without hemodynamically significant stenosis   5)Tobacco Abuse--- patient advised, continue nicotine patch   6) Chronic prescription opiate use -history of multiple surgeries -Continue PTA pain medications   7)  Bipolar 1 disorder/Anxiety -continue home depakote -continue lexapro  continue lorazepam   Disposition/--Home     Disposition: The patient is from: Home              Anticipated d/c is to: Home                Code Status :  -  Code Status: Full Code    Family Communication:    NA (patient is alert, awake and coherent)    Consults  :  Cardiology   Discharge Condition: Stable  Follow UP   Follow-up Information     Imogene Burn, PA-C Follow up on 07/26/2021.   Specialty: Cardiology Why: Cardiology Hospital Follow-up on 07/26/2021 at 1:00 PM. Contact information: [place] [address] [phone]                 Diet and Activity recommendation:  As advised  Discharge Instructions    Discharge Instructions     Amb referral to AFIB Clinic   Complete by: As directed    Call MD for:  difficulty breathing, headache or visual disturbances   Complete by: As directed    Call MD for:  persistant dizziness or light-headedness   Complete by: As directed    Call MD for:  persistant nausea and vomiting   Complete by: As directed    Call MD for:  temperature >100.4   Complete by: As directed    Diet - low sodium heart healthy   Complete by: As directed    Discharge instructions   Complete by: As directed    1)You are taking Apixaban/Eliquis so  please- Avoid ibuprofen/Advil/Aleve/Motrin/Goody Powders/Naproxen/BC powders/Meloxicam/Diclofenac/Indomethacin and other Nonsteroidal anti-inflammatory medications as these will make you more likely to bleed and can cause stomach ulcers, can also cause Kidney problems.   2)Please Take Amiodarone 1 Tablet 200 mg Twice Daily x 7 days, then 1 tab (200  mg) Daily after that  3) please keep your appointment with a cardiologist at the atrial fibrillation clinic  4) complete abstinence from tobacco and reduction in alcohol intake advised   Increase activity slowly   Complete by: As directed          Discharge Medications     Allergies as of 06/30/2021       Reactions   Morphine And Related    Delirium with sbo        Medication List     STOP taking these medications    BC Fast Pain Relief Arthritis 1000-65 MG Pack Generic drug: Aspirin-Caffeine   FLUoxetine 20 MG capsule Commonly known as: PROZAC       TAKE these medications    amiodarone 200 MG tablet Commonly known as: PACERONE Take 1 tablet (200 mg total) by mouth See admin instructions. Amiodarone 1 Tablet 200 mg Twice Daily x 7 days, then 1 tab (200 mg) Daily after that   apixaban 5 MG Tabs tablet Commonly known as: ELIQUIS Take 1 tablet (5 mg total) by mouth 2 (two) times daily.   b complex vitamins tablet Take 1 tablet by mouth daily.   Co Q 10 100 MG Caps Take 100 mg by mouth daily.   divalproex 500 MG 24 hr tablet Commonly known as: Depakote ER Take 1 tablet (500 mg total) by mouth daily.   furosemide 40 MG tablet Commonly known as: LASIX Take 1 tablet (40 mg total) by mouth daily.   HAIR SKIN & NAILS ADVANCED PO Take by mouth.   HYDROcodone-acetaminophen 5-325 MG tablet Commonly known as: Norco Take 1 tablet by mouth every 6 (six) hours as needed for moderate pain.   KRILL OIL PO Take 350 mg by mouth daily.   LORazepam 0.5 MG tablet Commonly known as: Ativan Take 1 tablet (0.5 mg total) by  mouth 3 (three) times daily as needed for anxiety.   multivitamin tablet Take 1 tablet by mouth daily.   nicotine 14 mg/24hr patch Commonly known as: NICODERM CQ - dosed in mg/24 hours Place 1 patch (14 mg total) onto the skin daily. Start taking on: July 01, 2021   omeprazole 40 MG capsule Commonly known as: PRILOSEC Take 1 capsule (40 mg total) by mouth daily.   ondansetron 4 MG tablet Commonly known as: ZOFRAN TAKE 1 TABLET BY MOUTH EVERY 8 HOURS AS NEEDED FOR NAUSEA AND VOMITING What changed: See the new instructions.   OVER THE COUNTER MEDICATION Take 1 tablet by mouth in the morning and at bedtime. Patient reports that she is taking a ViViscal Tablet by mouth twice daily for hair loss.   polyethylene glycol 17 g packet Commonly known as: MIRALAX / GLYCOLAX Take 17 g by mouth daily. What changed:  when to take this reasons to take this   rosuvastatin 20 MG tablet Commonly known as: CRESTOR Take 1 tablet (20 mg total) by mouth daily.   Vitamin D-3 125 MCG (5000 UT) Tabs Take 5,000 Units by mouth daily.        Major procedures and Radiology Reports - PLEASE review detailed and final reports for all details, in brief -    MR BRAIN WO CONTRAST  Result Date: 06/29/2021 CLINICAL DATA:  Neuro deficit, acute, stroke suspected EXAM: MRI HEAD WITHOUT CONTRAST TECHNIQUE: Multiplanar, multiecho pulse sequences of the brain and surrounding structures were obtained without intravenous contrast. COMPARISON:  None. FINDINGS: Brain: No acute infarction, hemorrhage, hydrocephalus, extra-axial collection or mass lesion. T2  hyperintense signal in the periventricular white matter, likely the sequela of chronic small vessel ischemic disease. Vascular: Normal flow voids. Skull and upper cervical spine: Normal marrow signal. Sinuses/Orbits: Minimal mucosal thickening in the ethmoid air cells. The orbits are unremarkable. Other: Trace fluid in the mastoid air cells. IMPRESSION: No acute  intracranial process. Electronically Signed   By: Merilyn Baba M.D.   On: 06/29/2021 15:00   CT Abdomen Pelvis W Contrast  Result Date: 06/03/2021 CLINICAL DATA:  Unintentional weight loss. Personal history ovarian and anal carcinoma. EXAM: CT ABDOMEN AND PELVIS WITH CONTRAST TECHNIQUE: Multidetector CT imaging of the abdomen and pelvis was performed using the standard protocol following bolus administration of intravenous contrast. CONTRAST:  36mL OMNIPAQUE IOHEXOL 350 MG/ML SOLN COMPARISON:  06/24/2020 FINDINGS: Lower Chest: No acute findings. Hepatobiliary: No hepatic masses identified. Gallbladder is unremarkable. No evidence of biliary ductal dilatation. Pancreas:  No mass or inflammatory changes. Spleen: Within normal limits in size and appearance. Adrenals/Urinary Tract: No masses identified. No evidence of ureteral calculi or hydronephrosis. Pelvic floor laxity with moderate cystocele noted. Stomach/Bowel: 3 cm diverticulum again seen arising from the posterior gastric fundus. No evidence of bowel obstruction, inflammatory process or abnormal fluid collections. Vascular/Lymphatic: No pathologically enlarged lymph nodes. No acute vascular findings. Aortic atherosclerotic calcification noted. Reproductive: Prior hysterectomy noted. Adnexal regions are unremarkable in appearance. Other:  None. Musculoskeletal:  No suspicious bone lesions identified. IMPRESSION: No acute findings. No evidence of recurrent or metastatic carcinoma. Pelvic floor laxity with moderate cystocele. Stable 3 cm gastric fundal diverticulum. Aortic Atherosclerosis (ICD10-I70.0). Electronically Signed   By: Marlaine Hind M.D.   On: 06/03/2021 11:01   US Carotid Bilateral  Result Date: 06/29/2021 CLINICAL DATA:  70 year old female with a history of syncope EXAM: BILATERAL CAROTID DUPLEX ULTRASOUND TECHNIQUE: Pearline Cables scale imaging, color Doppler and duplex ultrasound were performed of bilateral carotid and vertebral arteries in the neck.  COMPARISON:  None. FINDINGS: Criteria: Quantification of carotid stenosis is based on velocity parameters that correlate the residual internal carotid diameter with NASCET-based stenosis levels, using the diameter of the distal internal carotid lumen as the denominator for stenosis measurement. The following velocity measurements were obtained: RIGHT ICA:  Systolic 102 cm/sec, Diastolic 29 cm/sec CCA:  66 cm/sec SYSTOLIC ICA/CCA RATIO:  1.6 ECA:  107 cm/sec LEFT ICA:  Systolic 72 cm/sec, Diastolic 20 cm/sec CCA:  59 cm/sec SYSTOLIC ICA/CCA RATIO:  1.2 ECA:  129 cm/sec Right Brachial SBP: Not acquired Left Brachial SBP: Not acquired RIGHT CAROTID ARTERY: No significant calcifications of the right common carotid artery. Intermediate waveform maintained. Moderate heterogeneous and partially calcified plaque at the right carotid bifurcation. Lumen shadowing. Low resistance waveform of the right ICA. No significant tortuosity. RIGHT VERTEBRAL ARTERY: Antegrade flow with low resistance waveform. LEFT CAROTID ARTERY: No significant calcifications of the left common carotid artery. Intermediate waveform maintained. Moderate heterogeneous and partially calcified plaque at the left carotid bifurcation. Lumen shadowing. Low resistance waveform of the left ICA. No significant tortuosity. LEFT VERTEBRAL ARTERY: Antegrade flow. Low resistance waveform with parvus tardus waveform IMPRESSION: Color duplex indicates moderate heterogeneous and calcified plaque, with no hemodynamically significant stenosis by duplex criteria in the extracranial cerebrovascular circulation. Signed, Dulcy Fanny. Dellia Nims, RPVI [facility] Radiology Electronically Signed   By: Corrie Mckusick D.O.   On: 06/29/2021 16:10   DG Chest Port 1 View  Result Date: 06/28/2021 CLINICAL DATA:  Fatigue EXAM: PORTABLE CHEST 1 VIEW COMPARISON:  08/17/2014 FINDINGS: Right chest port  with catheter tip in the right atrium.  Normal cardiac and mediastinal contours. Aortic calcifications. No focal pulmonary opacity. No pleural effusion or pneumothorax. No acute osseous abnormality. IMPRESSION: No active disease. Electronically Signed   By: Merilyn Baba M.D.   On: 06/28/2021 12:24   ECHOCARDIOGRAM COMPLETE  Result Date: 06/14/2021    ECHOCARDIOGRAM REPORT   Patient Name:   Abigail Wagner  Date of Exam: 06/14/2021 Medical Rec #:  [id number]     Height:       65.0 in Accession #:    [id number]    Weight:       124.0 lb Date of Birth:  1950/11/28      BSA:          1.614 m Patient Age:    89 years      BP:           126/80 mmHg Patient Gender: F             HR:           60 bpm. Exam Location:  [place] Procedure: 2D Echo, Cardiac Doppler and Color Doppler Indications:    R06.00 Dyspnea  History:        Patient has no prior history of Echocardiogram examinations.                 Risk Factors:Hypertension and Dyslipidemia. Chronic kidney                 disease.  Sonographer:    Wilford Sports Rodgers-Jones RDCS Referring Phys: [place]  1. Left ventricular ejection fraction, by estimation, is 60 to 65%. The left ventricle has normal function. The left ventricle has no regional wall motion abnormalities. Left ventricular diastolic parameters are consistent with Grade II diastolic dysfunction (pseudonormalization).  2. Right ventricular systolic function is normal. The right ventricular size is mildly enlarged. There is normal pulmonary artery systolic pressure. The estimated right ventricular systolic pressure is 60.1 mmHg.  3. The mitral valve is normal in structure. Trivial mitral valve regurgitation. No evidence of mitral stenosis.  4. Tricuspid valve regurgitation is moderate.  5. The aortic valve is tricuspid. Aortic valve regurgitation is trivial. Mild to moderate aortic valve sclerosis/calcification is present, without any evidence of aortic stenosis.  6. The inferior vena cava is normal in size with greater  than 50% respiratory variability, suggesting right atrial pressure of 3 mmHg. FINDINGS  Left Ventricle: Left ventricular ejection fraction, by estimation, is 60 to 65%. The left ventricle has normal function. The left ventricle has no regional wall motion abnormalities. The left ventricular internal cavity size was normal in size. There is  no left ventricular hypertrophy. Left ventricular diastolic parameters are consistent with Grade II diastolic dysfunction (pseudonormalization). Right Ventricle: The right ventricular size is mildly enlarged. No increase in right ventricular wall thickness. Right ventricular systolic function is normal. There is normal pulmonary artery systolic pressure. The tricuspid regurgitant velocity is 2.42  m/s, and with an assumed right atrial pressure of 3 mmHg, the estimated right ventricular systolic pressure is 09.3 mmHg. Left Atrium: Left atrial size was normal in size. Right Atrium: Right atrial size was normal in size. Pericardium: There is no evidence of pericardial effusion. Mitral Valve: The mitral valve is normal in structure. Trivial mitral valve regurgitation. No evidence of mitral valve stenosis. Tricuspid Valve: The tricuspid valve is normal in structure. Tricuspid valve regurgitation is moderate . No evidence of tricuspid stenosis. Aortic Valve:  The aortic valve is tricuspid. Aortic valve regurgitation is trivial. Mild to moderate aortic valve sclerosis/calcification is present, without any evidence of aortic stenosis. Pulmonic Valve: The pulmonic valve was normal in structure. Pulmonic valve regurgitation is not visualized. No evidence of pulmonic stenosis. Aorta: The aortic root is normal in size and structure. Venous: The inferior vena cava is normal in size with greater than 50% respiratory variability, suggesting right atrial pressure of 3 mmHg. IAS/Shunts: No atrial level shunt detected by color flow Doppler.  LEFT VENTRICLE PLAX 2D LVIDd:         4.70 cm  Diastology  LVIDs:         2.60 cm  LV e' medial:    7.29 cm/s LV PW:         0.50 cm  LV E/e' medial:  15.5 LV IVS:        0.60 cm  LV e' lateral:   8.38 cm/s LVOT diam:     1.90 cm  LV E/e' lateral: 13.5 LV SV:         71 LV SV Index:   44 LVOT Area:     2.84 cm  RIGHT VENTRICLE             IVC RV Basal diam:  4.40 cm     IVC diam: 1.20 cm RV S prime:     14.95 cm/s TAPSE (M-mode): 2.7 cm LEFT ATRIUM             Index       RIGHT ATRIUM           Index LA diam:        4.80 cm 2.97 cm/m  RA Area:     12.50 cm LA Vol (A2C):   73.4 ml 45.46 ml/m RA Volume:   32.00 ml  19.82 ml/m LA Vol (A4C):   35.7 ml 22.11 ml/m LA Biplane Vol: 54.7 ml 33.88 ml/m  AORTIC VALVE LVOT Vmax:   116.00 cm/s LVOT Vmean:  73.500 cm/s LVOT VTI:    0.252 m  AORTA Ao Root diam: 3.10 cm Ao Asc diam:  3.50 cm MITRAL VALVE                TRICUSPID VALVE MV Area (PHT): 3.17 cm     TR Peak grad:   23.4 mmHg MV Decel Time: 239 msec     TR Vmax:        242.00 cm/s MV E velocity: 113.00 cm/s MV A velocity: 65.10 cm/s   SHUNTS MV E/A ratio:  1.74         Systemic VTI:  0.25 m                             Systemic Diam: 1.90 cm Candee Furbish MD Electronically signed by Candee Furbish MD Signature Date/Time: 06/14/2021/2:54:46 PM    Final     Micro Results  Recent Results (from the past 240 hour(s))  Resp Panel by RT-PCR (Flu A&B, Covid) Nasopharyngeal Swab     Status: None   Collection Time: 06/28/21 12:20 PM   Specimen: Nasopharyngeal Swab; Nasopharyngeal(NP) swabs in vial transport medium  Result Value Ref Range Status   SARS Coronavirus 2 by RT PCR NEGATIVE NEGATIVE Final    Comment: (NOTE) SARS-CoV-2 target nucleic acids are NOT DETECTED.  The SARS-CoV-2 RNA is generally detectable in upper respiratory specimens during the acute phase of infection. The lowest concentration of  SARS-CoV-2 viral copies this assay can detect is 138 copies/mL. A negative result does not preclude SARS-Cov-2 infection and should not be used as the sole basis for  treatment or other patient management decisions. A negative result may occur with  improper specimen collection/handling, submission of specimen other than nasopharyngeal swab, presence of viral mutation(s) within the areas targeted by this assay, and inadequate number of viral copies(<138 copies/mL). A negative result must be combined with clinical observations, patient history, and epidemiological information. The expected result is Negative.  Fact Sheet for Patients:  EntrepreneurPulse.com.au  Fact Sheet for Healthcare Providers:  IncredibleEmployment.be  This test is no t yet approved or cleared by the [place] FDA and  has been authorized for detection and/or diagnosis of SARS-CoV-2 by FDA under an Emergency Use Authorization (EUA). This EUA will remain  in effect (meaning this test can be used) for the duration of the COVID-19 declaration under Section 564(b)(1) of the Act, 21 U.S.C.section 360bbb-3(b)(1), unless the authorization is terminated  or revoked sooner.       Influenza A by PCR NEGATIVE NEGATIVE Final   Influenza B by PCR NEGATIVE NEGATIVE Final    Comment: (NOTE) The Xpert Xpress SARS-CoV-2/FLU/RSV plus assay is intended as an aid in the diagnosis of influenza from Nasopharyngeal swab specimens and should not be used as a sole basis for treatment. Nasal washings and aspirates are unacceptable for Xpert Xpress SARS-CoV-2/FLU/RSV testing.  Fact Sheet for Patients: EntrepreneurPulse.com.au  Fact Sheet for Healthcare Providers: IncredibleEmployment.be  This test is not yet approved or cleared by the [place] FDA and has been authorized for detection and/or diagnosis of SARS-CoV-2 by FDA under an Emergency Use Authorization (EUA). This EUA will remain in effect (meaning this test can be used) for the duration of the COVID-19 declaration under Section 564(b)(1) of the Act, 21  U.S.C. section 360bbb-3(b)(1), unless the authorization is terminated or revoked.  Performed at [facility], [address] [place], [place] [address]   MRSA Next Gen by PCR, Nasal     Status: None   Collection Time: 06/28/21  6:49 PM   Specimen: Nasal Mucosa; Nasal Swab  Result Value Ref Range Status   MRSA by PCR Next Gen NOT DETECTED NOT DETECTED Final    Comment: (NOTE) The GeneXpert MRSA Assay (FDA approved for NASAL specimens only), is one component of a comprehensive MRSA colonization surveillance program. It is not intended to diagnose MRSA infection nor to guide or monitor treatment for MRSA infections. Test performance is not FDA approved in patients less than 30 years old. Performed at [facility], [address], [place], [place] [address]        Today   Subjective    Abigail Wagner today has no new complaints  No fever  Or chills   No Nausea, Vomiting or Diarrhea -No chest pains no palpitations no dizziness or dyspnea on exertion at this time ---       Patient has been seen and examined prior to discharge   Objective   Blood pressure 138/66, pulse 62, temperature 98.4 F (36.9 C), temperature source Oral, resp. rate 16, height 5\' 5"  (1.651 m), weight 56.1 kg, SpO2 95 %.   Intake/Output Summary (Last 24 hours) at 06/30/2021 1118 Last data filed at 06/30/2021 0947 Gross per 24 hour  Intake 2948.59 ml  Output 700 ml  Net 2248.59 ml    Exam Gen:- Awake Alert,  in no apparent distress  HEENT:- [place].AT, No sclera icterus Neck-Supple Neck,No  JVD,.  Lungs-  CTAB , fair symmetrical air movement CV- S1, S2 normal, irregularly irregular  abd-  +ve B.Sounds, Abd Soft, No tenderness,    Extremity/Skin:- No  edema, pedal pulses present  Psych-affect is appropriate, oriented x3 Neuro-no new focal deficits, no tremors   Data Review   CBC w Diff:  Lab Results  Component Value Date   WBC 3.2 (L) 06/30/2021   HGB 10.5 (L) 06/30/2021   HCT 33.0 (L) 06/30/2021    PLT 147 (L) 06/30/2021   LYMPHOPCT 30 06/28/2021   BANDSPCT 2 12/14/2020   MONOPCT 8 06/28/2021   EOSPCT 2 06/28/2021   BASOPCT 1 06/28/2021    CMP:  Lab Results  Component Value Date   NA 143 06/29/2021   K 3.7 06/29/2021   CL 112 (H) 06/29/2021   CO2 24 06/29/2021   BUN 19 06/29/2021   CREATININE 0.86 06/29/2021   CREATININE 0.94 06/03/2021   PROT 5.4 (L) 06/29/2021   ALBUMIN 3.1 (L) 06/29/2021   BILITOT 0.5 06/29/2021   ALKPHOS 37 (L) 06/29/2021   AST 23 06/29/2021   ALT 11 06/29/2021  .   Total Discharge time is about 33 minutes  Roxan Hockey M.D on 06/30/2021 at 11:18 AM  Go to [email or web address] -  for contact info  [facility] - Office  [phone]

## 2021-07-02 NOTE — ED Provider Notes (Signed)
[facility] UNIT Provider Note   CSN: [id number] Arrival date & time: 06/28/21  1100     History Chief Complaint  Patient presents with   Irregular Heart Beat    Abigail Wagner is a 70 y.o. female.  Patient brought to emergency department for weakness.  Patient also complaining of palpitation  The history is provided by the patient and medical records. No language interpreter was used.  Weakness Severity:  Moderate Onset quality:  Sudden Timing:  Constant Progression:  Worsening Chronicity:  New Context: not alcohol use   Relieved by:  Nothing Worsened by:  Nothing Ineffective treatments:  None tried Associated symptoms: no abdominal pain, no chest pain, no cough, no diarrhea, no frequency, no headaches and no seizures       Past Medical History:  Diagnosis Date   Anal carcinoma ([place])    Chemo and radiation   Anemia    Anxiety    Dr. Harrington Challenger at [place] (psychiatrist).     Carotid artery disease (HCC)    DDD (degenerative disc disease), lumbosacral    GERD (gastroesophageal reflux disease)    Headache(784.0)    History of adenomatous polyp of colon 10/07/2015   Tubular adenoma high grade dysplasia   History of herpes genitalis    History of kidney stones    History of suicide attempt 2010   HTN (hypertension)    Hyperlipidemia    IBS (irritable bowel syndrome)    Major depression    OA (osteoarthritis)    Ovarian cancer ([place])    Stage IIIB papillary ovarian carcinoma  s/p omentectomy and BSO & chemotherapy (completed 12-07-2014)   Prolapse of vaginal vault after hysterectomy 07/20/2015   Rectovaginal fistula 08/05/2015   Seasonal allergies    Sigmoid diverticulosis    Smokers' cough [facility])     Patient Active Problem List   Diagnosis Date Noted   Atrial fibrillation with RVR ([place]) 06/28/2021   Recurrent Diverticulitis 06/28/2021   Poor appetite 06/28/2021   AKI (acute kidney injury) ([place]) 06/28/2021   Chronic diarrhea 10/06/2020    Chronic prescription opiate use 10/06/2020   SBO (small bowel obstruction) ([place]) 05/25/2020   Vulvar irritation 03/10/2020   Internal carotid artery stenosis, left    Neural foraminal stenosis of lumbar spine 05/16/2018   Genetic testing 05/24/2017   Vaginal infection 09/27/2016   Essential hypertension 09/27/2016   GERD (gastroesophageal reflux disease) 09/27/2016   Back pain 09/27/2016   Anxiety 09/27/2016   Allergy 09/27/2016   Radiation proctitis 12/23/2015   Abnormal weight loss 12/15/2015   Anal cancer ([place]) 11/19/2015   Dehydration 11/10/2015   History of ovarian cancer 08/03/2015   Prolapse of vaginal vault after hysterectomy 07/20/2015   Port-A-Cath in place 01/13/2015   Nausea with vomiting 10/23/2014   Epithelial ovarian cancer, FIGO stage IIIA ([place]) 07/16/2014   Abdominal carcinomatosis ([place]) 06/18/2014   Bipolar 1 disorder ([place]) 12/27/2011   Kidney stones 12/27/2011   Depression 11/07/2011    Past Surgical History:  Procedure Laterality Date   BIOPSY  10/12/2020   Procedure: BIOPSY;  Surgeon: Harvel Quale, MD;  [facility];  Service: Gastroenterology;;   BREAST ENHANCEMENT [place]   COLONOSCOPY N/A 10/07/2015   Procedure: COLONOSCOPY;  Surgeon: Rogene Houston, MD;  [facility];  Service: Endoscopy;  Laterality: N/A;  7:30   COLONOSCOPY WITH PROPOFOL N/A 10/12/2020   Procedure: COLONOSCOPY WITH PROPOFOL;  Surgeon: Montez Morita,  Quillian Quince, MD;  [facility];  Service: Gastroenterology;  Laterality: N/A;  9:00   ESOPHAGOGASTRODUODENOSCOPY (EGD) WITH PROPOFOL N/A 10/12/2020   Procedure: ESOPHAGOGASTRODUODENOSCOPY (EGD) WITH PROPOFOL;  Surgeon: Harvel Quale, MD;  [facility];  Service: Gastroenterology;  Laterality: N/A;   EXPLORATORY LAPAROTOMY/ OMENTECTOMY/  BILATERAL SALPINGOOPHORECTOMY/  PORT-A-CATH PLACEMENT  07-03-2014   [place]   KNEE ARTHROSCOPY Left  09-22-2004   LAPAROSCOPY N/A 06/02/2014   Procedure: DIAGNOSTIC LAPAROSCOPY, OMENTAL BIOPSY, RIGHT OVARY BIOPSY, LYSIS OF ADHESIONS;  Surgeon: Fanny Skates, MD;  Location: [place] [facility] OR;  Service: General;  Laterality: N/A;   MUCOSAL ADVANCEMENT FLAP N/A 06/15/2016   Procedure: [place] FISTULA WITH MUCOSAL ADVANCEMENT FLAP;  Surgeon: Leighton Ruff, MD;  Location: [facility];  Service: General;  Laterality: N/A;   [place] N/A 09/09/2015   Procedure: PLACEMENT OF SETON;  Surgeon: Leighton Ruff, MD;  Location: [facility];  Service: General;  Laterality: N/A;   PORT-A-CATH REMOVAL Right 08/17/2014   Procedure: REMOVAL INTRAPERITONEAL CHEMO PORT;  Surgeon: Fanny Skates, MD;  Location: [facility];  Service: General;  Laterality: Right;   PORTACATH PLACEMENT Right 08/17/2014   Procedure:  PLACE NEW PORT A CATH;  Surgeon: Fanny Skates, MD;  Location: [place];  Service: General;  Laterality: Right;   RECTAL BIOPSY N/A 09/09/2015   Procedure: BIOPSY OF RECTOVAGINAL MASS;  Surgeon: Leighton Ruff, MD;  Location: [place];  Service: General;  Laterality: N/A;   TUBAL LIGATION  YRS AGO   VAGINAL HYSTERECTOMY  1981   fibroids     OB History     Gravida  3   Para  3   Term      Preterm      AB      Living  3      SAB      IAB      Ectopic      Multiple      Live Births  3           Family History  Problem Relation Age of Onset   Bipolar disorder Mother    Anxiety disorder Mother    Dementia Mother    Depression Mother    Mental illness Mother    Vision loss Mother    Alzheimer's disease Mother    Alcohol abuse Paternal Uncle    Bipolar disorder Maternal Grandmother    Dementia Maternal Grandmother    Alzheimer's disease Maternal Grandmother    Alcohol abuse Paternal Uncle    Stroke Brother    Deep vein thrombosis Son    Heart disease Father    Hyperlipidemia Father     Hypertension Father    Stroke Father    Vision loss Father    Atrial fibrillation Sister    Heart disease Brother    Heart disease Brother    ADD / ADHD Neg Hx    Drug abuse Neg Hx    OCD Neg Hx    Paranoid behavior Neg Hx    Schizophrenia Neg Hx    Seizures Neg Hx    Sexual abuse Neg Hx    Physical abuse Neg Hx     Social History   Tobacco Use   Smoking status: Every Day    Packs/day: 0.50    Years: 43.00    Pack years: 21.50    Types: Cigarettes   Smokeless tobacco: Never  Vaping Use  Vaping Use: Never used  Substance Use Topics   Alcohol use: No   Drug use: No    Home Medications Prior to Admission medications   Medication Sig Start Date End Date Taking? Authorizing Provider  b complex vitamins tablet Take 1 tablet by mouth daily.   Yes [provider]  Cholecalciferol (VITAMIN D-3) 125 MCG (5000 UT) TABS Take 5,000 Units by mouth daily.   Yes [provider]  Coenzyme Q10 (CO Q 10) 100 MG CAPS Take 100 mg by mouth daily.   Yes [provider]  divalproex (DEPAKOTE ER) 500 MG 24 hr tablet Take 1 tablet (500 mg total) by mouth daily. 05/27/21  Yes [name], MD  HYDROcodone-acetaminophen (NORCO) 5-325 MG tablet Take 1 tablet by mouth every 6 (six) hours as needed for moderate pain. 06/28/21  Yes Susy Frizzle, MD  KRILL OIL PO Take 350 mg by mouth daily.   Yes [provider]  LORazepam (ATIVAN) 0.5 MG tablet Take 1 tablet (0.5 mg total) by mouth 3 (three) times daily as needed for anxiety. 05/27/21  Yes [name], MD  Multiple Vitamin (MULTIVITAMIN) tablet Take 1 tablet by mouth daily.   Yes [provider]  Multiple Vitamins-Minerals (HAIR SKIN & NAILS ADVANCED PO) Take by mouth.   Yes [provider]  omeprazole (PRILOSEC) 40 MG capsule Take 1 capsule (40 mg total) by mouth daily. 12/30/20  Yes Susy Frizzle, MD  ondansetron (ZOFRAN) 4 MG tablet TAKE 1 TABLET BY MOUTH EVERY 8 HOURS AS NEEDED FOR  NAUSEA AND VOMITING Patient taking differently: Take 4 mg by mouth every 8 (eight) hours as needed for nausea or vomiting. 04/26/21  Yes Susy Frizzle, MD  OVER THE COUNTER MEDICATION Take 1 tablet by mouth in the morning and at bedtime. Patient reports that she is taking a ViViscal Tablet by mouth twice daily for hair loss.   Yes [provider]  polyethylene glycol (MIRALAX / GLYCOLAX) 17 g packet Take 17 g by mouth daily. Patient taking differently: Take 17 g by mouth daily as needed for moderate constipation. 05/28/20  Yes Barton Dubois, MD  amiodarone (PACERONE) 200 MG tablet Take 1 tablet (200 mg total) by mouth See admin instructions. Amiodarone 1 Tablet 200 mg Twice Daily x 7 days, then 1 tab (200 mg) Daily after that 06/30/21   Roxan Hockey, MD  apixaban (ELIQUIS) 5 MG TABS tablet Take 1 tablet (5 mg total) by mouth 2 (two) times daily. 06/30/21   Roxan Hockey, MD  furosemide (LASIX) 40 MG tablet Take 1 tablet (40 mg total) by mouth daily. 06/30/21   Roxan Hockey, MD  nicotine (NICODERM CQ - DOSED IN MG/24 HOURS) 14 mg/24hr patch Place 1 patch (14 mg total) onto the skin daily. 07/01/21   Roxan Hockey, MD  rosuvastatin (CRESTOR) 20 MG tablet Take 1 tablet (20 mg total) by mouth daily. 06/30/21   Roxan Hockey, MD    Allergies    Morphine and related  Review of Systems   Review of Systems  Constitutional:  Negative for appetite change and fatigue.  HENT:  Negative for congestion, ear discharge and sinus pressure.   Eyes:  Negative for discharge.  Respiratory:  Negative for cough.   Cardiovascular:  Positive for palpitations. Negative for chest pain.  Gastrointestinal:  Negative for abdominal pain and diarrhea.  Genitourinary:  Negative for frequency and hematuria.  Musculoskeletal:  Negative for back pain.  Skin:  Negative for rash.  Neurological:  Positive for weakness. Negative for seizures and headaches.  Psychiatric/Behavioral:  Negative for  hallucinations.    Physical Exam Updated Vital Signs BP 138/66   Pulse 62   Temp 98.4 F (36.9 C) (Oral)   Resp 16   Ht 5\' 5"  (1.651 m)   Wt 56.1 kg   SpO2 95%   BMI 20.58 kg/m   Physical Exam Vitals and nursing note reviewed.  Constitutional:      Appearance: She is well-developed.  HENT:     Head: Normocephalic.  Eyes:     General: No scleral icterus.    Conjunctiva/sclera: Conjunctivae normal.  Neck:     Thyroid: No thyromegaly.  Cardiovascular:     Rate and Rhythm: Tachycardia present. Rhythm irregular.     Heart sounds: No murmur heard.   No friction rub. No gallop.  Pulmonary:     Breath sounds: No stridor. No wheezing or rales.  Chest:     Chest wall: No tenderness.  Abdominal:     General: There is no distension.     Tenderness: There is no abdominal tenderness. There is no rebound.  Musculoskeletal:        General: Normal range of motion.     Cervical back: Neck supple.  Lymphadenopathy:     Cervical: No cervical adenopathy.  Skin:    Findings: No erythema or rash.  Neurological:     Mental Status: She is alert and oriented to person, place, and time.     Motor: No abnormal muscle tone.     Coordination: Coordination normal.  Psychiatric:        Behavior: Behavior normal.    ED Results / Procedures / Treatments   Labs (all labs ordered are listed, but only abnormal results are displayed) Labs Reviewed  BASIC METABOLIC PANEL - Abnormal; Notable for the following components:      Result Value   BUN 26 (*)    Creatinine, Ser 1.10 (*)    Calcium 8.7 (*)    GFR, Estimated 54 (*)    All other components within normal limits  CBC - Abnormal; Notable for the following components:   WBC 3.7 (*)    RBC 3.70 (*)    Hemoglobin 11.9 (*)    RDW 16.7 (*)    All other components within normal limits  COMPREHENSIVE METABOLIC PANEL - Abnormal; Notable for the following components:   Chloride 112 (*)    Calcium 7.9 (*)    Total Protein 5.4 (*)    Albumin  3.1 (*)    Alkaline Phosphatase 37 (*)    All other components within normal limits  CBC - Abnormal; Notable for the following components:   WBC 3.2 (*)    RBC 3.57 (*)    Hemoglobin 11.3 (*)    HCT 35.3 (*)    RDW 16.9 (*)    All other components within normal limits  CBC - Abnormal; Notable for the following components:   WBC 3.2 (*)    RBC 3.33 (*)    Hemoglobin 10.5 (*)    HCT 33.0 (*)    RDW 17.0 (*)    Platelets 147 (*)    All other components within normal limits  RESP PANEL BY RT-PCR (FLU A&B, COVID) ARPGX2  MRSA NEXT GEN BY PCR, NASAL  HEPATIC FUNCTION PANEL  DIFFERENTIAL  HIV ANTIBODY (ROUTINE TESTING W REFLEX)  MAGNESIUM  LIPID PANEL    EKG EKG Interpretation  Date/Time:  Tuesday June 28 2021  12:21:02 EDT Ventricular Rate:  128 PR Interval:    QRS Duration: 82 QT Interval:  317 QTC Calculation: 463 R Axis:   83 Text Interpretation: Atrial fibrillation Borderline right axis deviation Minimal ST depression, inferior leads similar to earlier in the day Confirmed by Sherwood Gambler [phone]) on 06/29/2021 9:29:12 AM  Radiology No results found.  Procedures Procedures   Medications Ordered in ED Medications  amiodarone (NEXTERONE) 1.8 mg/mL load via infusion 150 mg (150 mg Intravenous Bolus from Bag 06/28/21 1645)    Followed by  amiodarone (NEXTERONE PREMIX) 360-4.14 MG/200ML-% (1.8 mg/mL) IV infusion (0 mg/hr Intravenous Stopped 06/28/21 2150)  sodium chloride 0.9 % bolus 500 mL (0 mLs Intravenous Stopped 06/28/21 1253)  sodium chloride 0.9 % bolus 500 mL (0 mLs Intravenous Stopped 06/28/21 1410)  sodium chloride 0.9 % bolus 500 mL (0 mLs Intravenous Stopped 06/28/21 1627)  sodium chloride 0.9 % bolus 1,000 mL (0 mLs Intravenous Stopped 06/28/21 1627)  sodium chloride 0.9 % bolus 500 mL (500 mLs Intravenous Bolus from Bag 06/28/21 2018)    ED Course  I have reviewed the triage vital signs and the nursing notes.  Pertinent labs & imaging results that were  available during my care of the patient were reviewed by me and considered in my medical decision making (see chart for details). CRITICAL CARE Performed by: Milton Ferguson Total critical care time:40 minutes Critical care time was exclusive of separately billable procedures and treating other patients. Critical care was necessary to treat or prevent imminent or life-threatening deterioration. Critical care was time spent personally by me on the following activities: development of treatment plan with patient and/or surrogate as well as nursing, discussions with consultants, evaluation of patient's response to treatment, examination of patient, obtaining history from patient or surrogate, ordering and performing treatments and interventions, ordering and review of laboratory studies, ordering and review of radiographic studies, pulse oximetry and re-evaluation of patient's condition.    MDM Rules/Calculators/A&P                           Patient will be admitted for rapid atrial fibs control Final Clinical Impression(s) / ED Diagnoses Final diagnoses:  Irregular heart beat  Atrial fibrillation with RVR (HCC)  Syncope and collapse    Rx / DC Orders ED Discharge Orders          Ordered    nicotine (NICODERM CQ - DOSED IN MG/24 HOURS) 14 mg/24hr patch  Daily        06/30/21 1116    amiodarone (PACERONE) 200 MG tablet  See admin instructions        06/30/21 1116    rosuvastatin (CRESTOR) 20 MG tablet  Daily        06/30/21 1116    furosemide (LASIX) 40 MG tablet  Daily        06/30/21 1116    apixaban (ELIQUIS) 5 MG TABS tablet  2 times daily        06/30/21 1116    Increase activity slowly        06/30/21 1116    Diet - low sodium heart healthy        06/30/21 1116    Discharge instructions       Comments: 1)You are taking Apixaban/Eliquis so please- Avoid ibuprofen/Advil/Aleve/Motrin/Goody Powders/Naproxen/BC powders/Meloxicam/Diclofenac/Indomethacin and other Nonsteroidal  anti-inflammatory medications as these will make you more likely to bleed and can cause stomach ulcers, can also cause Kidney problems.  2)Please Take Amiodarone 1 Tablet 200 mg Twice Daily x 7 days, then 1 tab (200 mg) Daily after that  3) please keep your appointment with a cardiologist at the atrial fibrillation clinic  4) complete abstinence from tobacco and reduction in alcohol intake advised   06/30/21 1116    Call MD for:  temperature >100.4        06/30/21 1116    Call MD for:  persistant nausea and vomiting        06/30/21 1116    Call MD for:  difficulty breathing, headache or visual disturbances        06/30/21 1116    Call MD for:  persistant dizziness or light-headedness        06/30/21 1116    Amb referral to AFIB Clinic        06/28/21 1412             Milton Ferguson, MD 07/02/21 1107

## 2021-07-04 ENCOUNTER — Telehealth: Payer: Self-pay

## 2021-07-04 NOTE — Telephone Encounter (Signed)
Transition Care Management Follow-up Telephone Call Date of discharge and from where: 06/30/21 from [facility] How have you been since you were released from the hospital? "I've done fairly well". Any questions or concerns? No  Items Reviewed: Did the pt receive and understand the discharge instructions provided? Yes  Medications obtained and verified? Yes  Other? No  Any new allergies since your discharge? No  Dietary orders reviewed? Yes Do you have support at home? Yes   Home Care and Equipment/Supplies: Were home health services ordered? no If so, what is the name of the agency? Not applicable  Has the agency set up a time to come to the patient's home? not applicable Were any new equipment or medical supplies ordered?  No What is the name of the medical supply agency? no Were you able to get the supplies/equipment? not applicable Do you have any questions related to the use of the equipment or supplies? No  Functional Questionnaire: (I = Independent and D = Dependent) ADLs: husband assist as needed  Bathing/Dressing- husband assist as needed  Meal Prep- assistance  Eating- I  Maintaining continence- I  Transferring/Ambulation- I  Managing Meds- Assistance by husband  Follow up appointments reviewed:  PCP Hospital f/u appt confirmed? No  will see specialist Specialist Hospital f/u appt confirmed? Yes  Scheduled to see Amie Portland on 07/26/21 @ 1:00pm. Husband to reach out to Afib clinic to ensure appointment is scheduled. Are transportation arrangements needed? No  If their condition worsens, is the pt aware to call PCP or go to the Emergency Dept.? Yes Was the patient provided with contact information for the PCP's office or ED? Yes Was to pt encouraged to call back with questions or concerns? Yes  Thea Silversmith, RN, MSN, BSN, [place] Care Management Coordinator [phone]

## 2021-07-06 ENCOUNTER — Ambulatory Visit (HOSPITAL_COMMUNITY): Payer: Medicare Other

## 2021-07-07 ENCOUNTER — Telehealth: Payer: Self-pay

## 2021-07-10 ENCOUNTER — Encounter: Payer: Self-pay | Admitting: Family Medicine

## 2021-07-11 MED ORDER — HYDROCODONE-ACETAMINOPHEN 5-325 MG PO TABS: 1.0000 | ORAL_TABLET | Freq: Four times a day (QID) | ORAL | 0 refills | Status: DC | PRN

## 2021-07-11 NOTE — Telephone Encounter (Signed)
Ok to refill??  Last office visit/ refill 06/28/2021, #30 tabs. Should this last 30 days?  Please review VS and advise.

## 2021-07-13 ENCOUNTER — Encounter (HOSPITAL_COMMUNITY): Payer: Medicare Other

## 2021-07-17 ENCOUNTER — Other Ambulatory Visit (HOSPITAL_COMMUNITY): Payer: Self-pay | Admitting: Psychiatry

## 2021-07-18 NOTE — Telephone Encounter (Signed)
Please call pt to see if she is still taking prozac. Meds were changed in hospital

## 2021-07-19 NOTE — Progress Notes (Signed)
Cardiology Office Note    Date:  07/26/2021   ID:  Abigail Wagner, DOB [birth year], MRN [id number]   PCP:  Donita Brooks, MD   [facility]  Cardiologist:  None   Advanced Practice Provider:  No care team member to display Electrophysiologist:  None   [id number]   Chief Complaint  Patient presents with   Hospitalization Follow-up    History of Present Illness:  Abigail Wagner is a 70 y.o. female  with a history of nonobstructive carotid artery disease, depression, tobacco use, hyperlipidemia, hypertension, previously treated ovarian cancer and anal carcinoma   Patient presented to [facility] 06/28/21 with new onset atrial fibrillation with RVR, did not tolerate diltiazem because of hypotension and was placed on amiodarone and eliquis.  Echo 06/14/21 showed normal LVEF 60 to 65% moderate diastolic dysfunction and normal left atrial size.  Patient comes in for f/u. Denies chest pain, dyspnea, palpitations, dizziness or presyncope. No bleeding problems on eliquis.  Past Medical History:  Diagnosis Date   Anal carcinoma (HCC)    Chemo and radiation   Anemia    Anxiety    Dr. Tenny Craw at [facility] (psychiatrist).     Carotid artery disease (HCC)    DDD (degenerative disc disease), lumbosacral    GERD (gastroesophageal reflux disease)    Headache(784.0)    History of adenomatous polyp of colon 10/07/2015   Tubular adenoma high grade dysplasia   History of herpes genitalis    History of kidney stones    History of suicide attempt 2010   HTN (hypertension)    Hyperlipidemia    IBS (irritable bowel syndrome)    Major depression    OA (osteoarthritis)    Ovarian cancer (HCC)    Stage IIIB papillary ovarian carcinoma  s/p omentectomy and BSO & chemotherapy (completed 12-07-2014)   Prolapse of vaginal vault after hysterectomy 07/20/2015   Rectovaginal fistula 08/05/2015   Seasonal allergies    Sigmoid diverticulosis    Smokers' cough [facility])     Past  Surgical History:  Procedure Laterality Date   BIOPSY  10/12/2020   Procedure: BIOPSY;  Surgeon: Dolores Frame, MD;  [facility];  Service: Gastroenterology;;   BREAST ENHANCEMENT SURGERY  1986   CESAREAN SECTION  1978   COLONOSCOPY N/A 10/07/2015   Procedure: COLONOSCOPY;  Surgeon: Malissa Hippo, MD;  [facility];  Service: Endoscopy;  Laterality: N/A;  7:30   COLONOSCOPY WITH PROPOFOL N/A 10/12/2020   Procedure: COLONOSCOPY WITH PROPOFOL;  Surgeon: Dolores Frame, MD;  [facility];  Service: Gastroenterology;  Laterality: N/A;  9:00   ESOPHAGOGASTRODUODENOSCOPY (EGD) WITH PROPOFOL N/A 10/12/2020   Procedure: ESOPHAGOGASTRODUODENOSCOPY (EGD) WITH PROPOFOL;  Surgeon: Dolores Frame, MD;  [facility];  Service: Gastroenterology;  Laterality: N/A;   EXPLORATORY LAPAROTOMY/ OMENTECTOMY/  BILATERAL SALPINGOOPHORECTOMY/  PORT-A-CATH PLACEMENT  07-03-2014   [place]   KNEE ARTHROSCOPY Left 09-22-2004   LAPAROSCOPY N/A 06/02/2014   Procedure: DIAGNOSTIC LAPAROSCOPY, OMENTAL BIOPSY, RIGHT OVARY BIOPSY, LYSIS OF ADHESIONS;  Surgeon: Claud Kelp, MD;  Location: [facility] OR;  Service: General;  Laterality: N/A;   MUCOSAL ADVANCEMENT FLAP N/A 06/15/2016   Procedure: EXCISION RECTOVAGINAOL FISTULA WITH MUCOSAL ADVANCEMENT FLAP;  Surgeon: Romie Levee, MD;  Location: [facility];  Service: General;  Laterality: N/A;   PLACEMENT OF SETON N/A 09/09/2015   Procedure: PLACEMENT OF SETON;  Surgeon: Romie Levee, MD;  Location:  SURGERY  CENTER;  Service: General;  Laterality: N/A;   PORT-A-CATH REMOVAL Right 08/17/2014   Procedure: REMOVAL INTRAPERITONEAL CHEMO PORT;  Surgeon: Fanny Skates, MD;  Location: [place];  Service: General;  Laterality: Right;   PORTACATH PLACEMENT Right 08/17/2014   Procedure:  PLACE NEW PORT A CATH;  Surgeon: Fanny Skates, MD;  Location: [place];  Service: General;  Laterality: Right;   RECTAL BIOPSY N/A 09/09/2015   Procedure: BIOPSY OF RECTOVAGINAL MASS;  Surgeon: Leighton Ruff, MD;  Location: [place];  Service: General;  Laterality: N/A;   TUBAL LIGATION  YRS AGO   VAGINAL HYSTERECTOMY  1981   fibroids    Current Medications: Current Meds  Medication Sig   amiodarone (PACERONE) 200 MG tablet Take 1 tablet (200 mg total) by mouth See admin instructions. Amiodarone 1 Tablet 200 mg Twice Daily x 7 days, then 1 tab (200 mg) Daily after that   apixaban (ELIQUIS) 5 MG TABS tablet Take 1 tablet (5 mg total) by mouth 2 (two) times daily.   b complex vitamins tablet Take 1 tablet by mouth daily.   Cholecalciferol (VITAMIN D-3) 125 MCG (5000 UT) TABS Take 5,000 Units by mouth daily.   Coenzyme Q10 (CO Q 10) 100 MG CAPS Take 100 mg by mouth daily.   divalproex (DEPAKOTE ER) 500 MG 24 hr tablet Take 1 tablet (500 mg total) by mouth daily.   escitalopram (LEXAPRO) 20 MG tablet Take 1 tablet (20 mg total) by mouth 2 (two) times daily.   furosemide (LASIX) 40 MG tablet Take 1 tablet (40 mg total) by mouth daily.   HYDROcodone-acetaminophen (NORCO) 5-325 MG tablet Take 1 tablet by mouth every 6 (six) hours as needed for moderate pain.   KRILL OIL PO Take 350 mg by mouth daily.   LORazepam (ATIVAN) 0.5 MG tablet Take 1 tablet (0.5 mg total) by mouth 3 (three) times daily as needed for anxiety.   Multiple Vitamin (MULTIVITAMIN) tablet Take 1 tablet by mouth daily.   Multiple Vitamins-Minerals (HAIR SKIN & NAILS ADVANCED PO) Take by mouth.   nicotine (NICODERM CQ - DOSED IN MG/24 HOURS) 14 mg/24hr patch Place 1 patch (14 mg total) onto the skin daily.   omeprazole (PRILOSEC) 40 MG capsule Take 1 capsule (40 mg total) by mouth daily.   ondansetron (ZOFRAN) 4 MG tablet TAKE 1 TABLET BY MOUTH EVERY 8 HOURS AS NEEDED FOR NAUSEA AND VOMITING (Patient taking differently: Take 4 mg by mouth every 8 (eight) hours as needed for  nausea or vomiting.)   polyethylene glycol (MIRALAX / GLYCOLAX) 17 g packet Take 17 g by mouth daily. (Patient taking differently: Take 17 g by mouth daily as needed for moderate constipation.)   rosuvastatin (CRESTOR) 20 MG tablet Take 1 tablet (20 mg total) by mouth daily.     Allergies:   Morphine and related   Social History   Socioeconomic History   Marital status: Married    Spouse name: Coralyn Mark   Number of children: 3   Years of education: 12   Highest education level: Not on file  Occupational History   Occupation: retire    Comment: [employer]  Tobacco Use   Smoking status: Former    Packs/day: 0.50    Years: 43.00    Pack years: 21.50    Types: Cigarettes    Quit date: 06/25/2021    Years since quitting: 0.0   Smokeless tobacco: Never  Vaping Use   Vaping  Use: Never used  Substance and Sexual Activity   Alcohol use: No   Drug use: No   Sexual activity: Not Currently    Birth control/protection: Surgical    Comment: hyst  Other Topics Concern   Not on file  Social History Narrative   Lives at home with Coralyn Mark   Retired [employer]   Social Determinants of Health   Financial Resource Strain: Low Risk    Difficulty of Paying Living Expenses: Not very hard  Food Insecurity: Not on file  Transportation Needs: Not on file  Physical Activity: Not on file  Stress: Not on file  Social Connections: Not on file     Family History:  The patient's  family history includes Alcohol abuse in her paternal uncle and paternal uncle; Alzheimer's disease in her maternal grandmother and mother; Anxiety disorder in her mother; Atrial fibrillation in her sister; Bipolar disorder in her maternal grandmother and mother; Deep vein thrombosis in her son; Dementia in her maternal grandmother and mother; Depression in her mother; Heart disease in her brother, brother, and father; Hyperlipidemia in her father; Hypertension in her father; Mental illness in her mother; Stroke in her brother  and father; Vision loss in her father and mother.   ROS:   Please see the history of present illness.    ROS All other systems reviewed and are negative.   PHYSICAL EXAM:   VS:  BP 106/64   Pulse 82   Ht $R'5\' 5"'zr$  (1.651 m)   Wt 107 lb 6.4 oz (48.7 kg)   SpO2 96%   BMI 17.87 kg/m   Physical Exam  GEN: Thin, in no acute distress  Neck: no JVD, carotid bruits, or masses Cardiac:RRR; no murmurs, rubs, or gallops  Respiratory:  clear to auscultation bilaterally, normal work of breathing GI: soft, nontender, nondistended, + BS Ext: without cyanosis, clubbing, or edema, Good distal pulses bilaterally Neuro:  Alert and Oriented x 3 Psych: euthymic mood, full affect  Wt Readings from Last 3 Encounters:  07/26/21 107 lb 6.4 oz (48.7 kg)  06/30/21 123 lb 10.9 oz (56.1 kg)  06/28/21 124 lb (56.2 kg)      Studies/Labs Reviewed:   EKG:  EKG is not ordered today.     Recent Labs: 03/07/2021: TSH 1.82 06/03/2021: Brain Natriuretic Peptide 379 06/29/2021: Magnesium 2.0 07/25/2021: ALT 16; BUN 26; Creatinine, Ser 0.82; Hemoglobin 12.0; Platelets 253; Potassium 4.1; Sodium 136   Lipid Panel    Component Value Date/Time   CHOL 140 06/29/2021 0519   TRIG 76 06/29/2021 0519   HDL 51 06/29/2021 0519   CHOLHDL 2.7 06/29/2021 0519   VLDL 15 06/29/2021 0519   LDLCALC 74 06/29/2021 0519   LDLCALC 80 01/10/2021 1017    Additional studies/ records that were reviewed today include:  Echocardiogram 06/14/2021:  1. Left ventricular ejection fraction, by estimation, is 60 to 65%. The  left ventricle has normal function. The left ventricle has no regional  wall motion abnormalities. Left ventricular diastolic parameters are  consistent with Grade II diastolic  dysfunction (pseudonormalization).   2. Right ventricular systolic function is normal. The right ventricular  size is mildly enlarged. There is normal pulmonary artery systolic  pressure. The estimated right ventricular systolic pressure  is 60.1 mmHg.   3. The mitral valve is normal in structure. Trivial mitral valve  regurgitation. No evidence of mitral stenosis.   4. Tricuspid valve regurgitation is moderate.   5. The aortic valve is tricuspid. Aortic valve regurgitation  is trivial.  Mild to moderate aortic valve sclerosis/calcification is present, without  any evidence of aortic stenosis.   6. The inferior vena cava is normal in size with greater than 50%  respiratory variability, suggesting right atrial pressure of 3 mmHg.    Risk Assessment/Calculations:    CHA2DS2-VASc Score = 4   This indicates a 4.8% annual risk of stroke. The patient's score is based upon: CHF History: 0 HTN History: 1 Diabetes History: 0 Stroke History: 0 Vascular Disease History: 1 Age Score: 1 Gender Score: 1       ASSESSMENT:    1. Paroxysmal atrial fibrillation (HCC)   2. Essential hypertension   3. Coronary artery disease involving native coronary artery of native heart without angina pectoris      PLAN:  In order of problems listed above:  Paroxysmal atrial fibrillation CHA2DS2-VASc equals 4 on Eliquis and amiodarone.  She does not tolerate atrial fib well.  2D echo LVEF 60 to 65% with normal left atrial chamber size.  Overall doing well and maintaining normal sinus rhythm.  No bleeding problems on Eliquis.  Will need to have regular monitoring including TSH LFTs chest x-rays on amiodarone, CBC and c-Met on Eliquis.  Blood work done yesterday stable.  Hypertension well-controlled   Non obstructive CAD asymptomatic  Shared Decision Making/Informed Consent        Medication Adjustments/Labs and Tests Ordered: Current medicines are reviewed at length with the patient today.  Concerns regarding medicines are outlined above.  Medication changes, Labs and Tests ordered today are listed in the Patient Instructions below. Patient Instructions  Medication Instructions:  Your physician recommends that you continue on your  current medications as directed. Please refer to the Current Medication list given to you today.  *If you need a refill on your cardiac medications before your next appointment, please call your pharmacy*   Lab Work: NONE   If you have labs (blood work) drawn today and your tests are completely normal, you will receive your results only by: Frannie (if you have MyChart) OR A paper copy in the mail If you have any lab test that is abnormal or we need to change your treatment, we will call you to review the results.   Testing/Procedures: NONE    Follow-Up: At [facility], you and your health needs are our priority.  As part of our continuing mission to provide you with exceptional heart care, we have created designated Provider Care Teams.  These Care Teams include your primary Cardiologist (physician) and Advanced Practice Providers (APPs -  Physician Assistants and Nurse Practitioners) who all work together to provide you with the care you need, when you need it.  We recommend signing up for the patient portal called "MyChart".  Sign up information is provided on this After Visit Summary.  MyChart is used to connect with patients for Virtual Visits (Telemedicine).  Patients are able to view lab/test results, encounter notes, upcoming appointments, etc.  Non-urgent messages can be sent to your provider as well.   To learn more about what you can do with MyChart, go to [email or web address].    Your next appointment:   4 month(s)  The format for your next appointment:   In Person  Provider:   Rozann Lesches, MD   Other Instructions Thank you for choosing [facility]!     Signed, Ermalinda Barrios, PA-C  07/26/2021 1:26 PM    [facility] [place], [place]  [address] Phone: [phone]; Fax: [phone]    

## 2021-07-20 ENCOUNTER — Encounter (HOSPITAL_COMMUNITY): Payer: Medicare Other | Admitting: Physical Therapy

## 2021-07-20 NOTE — Telephone Encounter (Signed)
Spoke with patient and she and her husband stated patient is no longer taking this medication(Prozac)

## 2021-07-21 ENCOUNTER — Encounter: Payer: Self-pay | Admitting: Family Medicine

## 2021-07-21 ENCOUNTER — Other Ambulatory Visit: Payer: Self-pay | Admitting: Family Medicine

## 2021-07-21 MED ORDER — HYDROCODONE-ACETAMINOPHEN 5-325 MG PO TABS: 1.0000 | ORAL_TABLET | Freq: Four times a day (QID) | ORAL | 0 refills | Status: DC | PRN

## 2021-07-21 NOTE — Telephone Encounter (Signed)
Ok to refill??  Last office visit 06/28/2021.  Last refill 07/11/2021.

## 2021-07-25 ENCOUNTER — Inpatient Hospital Stay (HOSPITAL_COMMUNITY): Payer: Medicare Other | Attending: Hematology

## 2021-07-25 ENCOUNTER — Other Ambulatory Visit: Payer: Self-pay

## 2021-07-25 DIAGNOSIS — R971 Elevated cancer antigen 125 [CA 125]: Secondary | ICD-10-CM | POA: Diagnosis not present

## 2021-07-25 DIAGNOSIS — D649 Anemia, unspecified: Secondary | ICD-10-CM

## 2021-07-25 DIAGNOSIS — Z8041 Family history of malignant neoplasm of ovary: Secondary | ICD-10-CM | POA: Diagnosis not present

## 2021-07-25 DIAGNOSIS — C569 Malignant neoplasm of unspecified ovary: Secondary | ICD-10-CM

## 2021-07-25 DIAGNOSIS — Z85048 Personal history of other malignant neoplasm of rectum, rectosigmoid junction, and anus: Secondary | ICD-10-CM | POA: Insufficient documentation

## 2021-07-25 LAB — CBC WITH DIFFERENTIAL/PLATELET
Abs Immature Granulocytes: 0.01 K/uL (ref 0.00–0.07)
Basophils Absolute: 0 K/uL (ref 0.0–0.1)
Basophils Relative: 1 %
Eosinophils Absolute: 0.1 K/uL (ref 0.0–0.5)
Eosinophils Relative: 2 %
HCT: 36.1 % (ref 36.0–46.0)
Hemoglobin: 12 g/dL (ref 12.0–15.0)
Immature Granulocytes: 0 %
Lymphocytes Relative: 34 %
Lymphs Abs: 1.4 K/uL (ref 0.7–4.0)
MCH: 32.8 pg (ref 26.0–34.0)
MCHC: 33.2 g/dL (ref 30.0–36.0)
MCV: 98.6 fL (ref 80.0–100.0)
Monocytes Absolute: 0.4 K/uL (ref 0.1–1.0)
Monocytes Relative: 10 %
Neutro Abs: 2.2 K/uL (ref 1.7–7.7)
Neutrophils Relative %: 53 %
Platelets: 253 K/uL (ref 150–400)
RBC: 3.66 MIL/uL — ABNORMAL LOW (ref 3.87–5.11)
RDW: 15.8 % — ABNORMAL HIGH (ref 11.5–15.5)
WBC: 4.2 K/uL (ref 4.0–10.5)
nRBC: 0 % (ref 0.0–0.2)

## 2021-07-25 LAB — COMPREHENSIVE METABOLIC PANEL WITH GFR
ALT: 16 U/L (ref 0–44)
AST: 24 U/L (ref 15–41)
Albumin: 4.1 g/dL (ref 3.5–5.0)
Alkaline Phosphatase: 52 U/L (ref 38–126)
Anion gap: 9 (ref 5–15)
BUN: 26 mg/dL — ABNORMAL HIGH (ref 8–23)
CO2: 28 mmol/L (ref 22–32)
Calcium: 9.1 mg/dL (ref 8.9–10.3)
Chloride: 99 mmol/L (ref 98–111)
Creatinine, Ser: 0.82 mg/dL (ref 0.44–1.00)
GFR, Estimated: >60 mL/min (ref >60)
Glucose, Bld: 97 mg/dL (ref 70–99)
Potassium: 4.1 mmol/L (ref 3.5–5.1)
Sodium: 136 mmol/L (ref 135–145)
Total Bilirubin: 0.5 mg/dL (ref 0.3–1.2)
Total Protein: 7.2 g/dL (ref 6.5–8.1)

## 2021-07-25 LAB — IRON AND TIBC
Iron: 86 ug/dL (ref 28–170)
Saturation Ratios: 21 % (ref 10.4–31.8)
TIBC: 401 ug/dL (ref 250–450)
UIBC: 315 ug/dL

## 2021-07-25 LAB — FERRITIN: Ferritin: 60 ng/mL (ref 11–307)

## 2021-07-25 MED ORDER — HEPARIN SOD (PORK) LOCK FLUSH 100 UNIT/ML IV SOLN
500.0000 [IU] | Freq: Once | INTRAVENOUS | Status: AC
  Administered 2021-07-25: 500 [IU] via INTRAVENOUS

## 2021-07-25 MED ORDER — SODIUM CHLORIDE 0.9% FLUSH
10.0000 mL | Freq: Once | INTRAVENOUS | Status: AC
  Administered 2021-07-25: 10 mL via INTRAVENOUS

## 2021-07-25 NOTE — Progress Notes (Signed)
Abigail Wagner presented for Portacath access and flush with labs.  Portacath located right chest wall accessed with  H 20 needle.  Good blood return present. Portacath flushed with 39ml NS and 500U/53ml Heparin and needle removed intact.  Procedure tolerated well and without incident.     Port flush with labs given today per MD orders. Tolerated infusion without adverse affects. Vital signs stable. No complaints at this time. Discharged from clinic ambulatory in stable condition. Alert and oriented x 3. F/U with [facility] as scheduled.

## 2021-07-25 NOTE — Patient Instructions (Signed)
[facility]  Discharge Instructions: Thank you for choosing [facility] to provide your oncology and hematology care.  If you have a lab appointment with the Cancer Center, please come in thru the Main Entrance and check in at the main information desk.  Wear comfortable clothing and clothing appropriate for easy access to any Portacath or PICC line.   We strive to give you quality time with your provider. You may need to reschedule your appointment if you arrive late (15 or more minutes).  Arriving late affects you and other patients whose appointments are after yours.  Also, if you miss three or more appointments without notifying the office, you may be dismissed from the clinic at the provider's discretion.      For prescription refill requests, have your pharmacy contact our office and allow 72 hours for refills to be completed.    Today you received port flush with labs.      BELOW ARE SYMPTOMS THAT SHOULD BE REPORTED IMMEDIATELY: *FEVER GREATER THAN 100.4 F (38 C) OR HIGHER *CHILLS OR SWEATING *NAUSEA AND VOMITING THAT IS NOT CONTROLLED WITH YOUR NAUSEA MEDICATION *UNUSUAL SHORTNESS OF BREATH *UNUSUAL BRUISING OR BLEEDING *URINARY PROBLEMS (pain or burning when urinating, or frequent urination) *BOWEL PROBLEMS (unusual diarrhea, constipation, pain near the anus) TENDERNESS IN MOUTH AND THROAT WITH OR WITHOUT PRESENCE OF ULCERS (sore throat, sores in mouth, or a toothache) UNUSUAL RASH, SWELLING OR PAIN  UNUSUAL VAGINAL DISCHARGE OR ITCHING   Items with * indicate a potential emergency and should be followed up as soon as possible or go to the Emergency Department if any problems should occur.  Please show the CHEMOTHERAPY ALERT CARD or IMMUNOTHERAPY ALERT CARD at check-in to the Emergency Department and triage nurse.  Should you have questions after your visit or need to cancel or reschedule your appointment, please contact [facility]  [phone]  and follow the prompts.  Office hours are 8:00 a.m. to 4:30 p.m. Monday - Friday. Please note that voicemails left after 4:00 p.m. may not be returned until the following business day.  We are closed weekends and major holidays. You have access to a nurse at all times for urgent questions. Please call the main number to the clinic [phone] and follow the prompts.  For any non-urgent questions, you may also contact your provider using MyChart. We now offer e-Visits for anyone 18 and older to request care online for non-urgent symptoms. For details visit [email or web address].   Also download the MyChart app! Go to the app store, search "MyChart", open the app, select [facility], and log in with your MyChart username and password.  Due to Covid, a mask is required upon entering the hospital/clinic. If you do not have a mask, one will be given to you upon arrival. For doctor visits, patients may have 1 support person aged 18 or older with them. For treatment visits, patients cannot have anyone with them due to current Covid guidelines and our immunocompromised population.  

## 2021-07-26 ENCOUNTER — Encounter: Payer: Self-pay | Admitting: Physician Assistant

## 2021-07-26 ENCOUNTER — Ambulatory Visit: Payer: Medicare Other | Admitting: Physician Assistant

## 2021-07-26 ENCOUNTER — Encounter (HOSPITAL_COMMUNITY): Payer: Self-pay | Admitting: Psychiatry

## 2021-07-26 ENCOUNTER — Telehealth (INDEPENDENT_AMBULATORY_CARE_PROVIDER_SITE_OTHER): Payer: Medicare Other | Admitting: Psychiatry

## 2021-07-26 VITALS — BP 106/64 | HR 82 | Ht 65.0 in | Wt 107.4 lb

## 2021-07-26 DIAGNOSIS — I480 Paroxysmal atrial fibrillation: Secondary | ICD-10-CM

## 2021-07-26 DIAGNOSIS — I2510 Atherosclerotic heart disease of native coronary artery without angina pectoris: Secondary | ICD-10-CM | POA: Diagnosis not present

## 2021-07-26 DIAGNOSIS — F331 Major depressive disorder, recurrent, moderate: Secondary | ICD-10-CM | POA: Diagnosis not present

## 2021-07-26 DIAGNOSIS — I10 Essential (primary) hypertension: Secondary | ICD-10-CM | POA: Diagnosis not present

## 2021-07-26 LAB — CA 125: Cancer Antigen (CA) 125: 18.3 U/mL (ref 0.0–38.1)

## 2021-07-26 MED ORDER — ESCITALOPRAM OXALATE 20 MG PO TABS: 20.0000 mg | ORAL_TABLET | Freq: Two times a day (BID) | ORAL | 2 refills | Status: DC

## 2021-07-26 MED ORDER — DIVALPROEX SODIUM ER 500 MG PO TB24: 500.0000 mg | ORAL_TABLET | Freq: Every day | ORAL | 3 refills | Status: DC

## 2021-07-26 MED ORDER — LORAZEPAM 0.5 MG PO TABS: 0.5000 mg | ORAL_TABLET | Freq: Three times a day (TID) | ORAL | 2 refills | Status: DC | PRN

## 2021-07-26 NOTE — Progress Notes (Signed)
Virtual Visit via Telephone Note  I connected with Abigail Wagner on 07/26/21 at 10:00 AM EDT by telephone and verified that I am speaking with the correct person using two identifiers.  Location: Patient: home Provider: office   I discussed the limitations, risks, security and privacy concerns of performing an evaluation and management service by telephone and the availability of in person appointments. I also discussed with the patient that there may be a patient responsible charge related to this service. The patient expressed understanding and agreed to proceed.      I discussed the assessment and treatment plan with the patient. The patient was provided an opportunity to ask questions and all were answered. The patient agreed with the plan and demonstrated an understanding of the instructions.   The patient was advised to call back or seek an in-person evaluation if the symptoms worsen or if the condition fails to improve as anticipated.  I provided 15 minutes of non-face-to-face time during this encounter.   [name], MD  [facility] MD/PA/NP OP Progress Note  07/26/2021 10:15 AM Abigail Wagner  MRN:  [id number]  Chief Complaint:  Chief Complaint   Depression; Anxiety; Manic Behavior; Follow-up    HPI: This patient is a 70 year old married white female lives with her husband in [place]. She has 3 children and 5 grandchildren. She is retired from [employer].   The patient states she's had depression since her 15s. She was hospitalized several times, last time being in 2010 when she took a drug overdose. She was going through a lot of family stress back then. She states that she was [facility] and since then she has never wanted to go back. She's been quite stable despite her low doses of medication. She's also stopped drinking alcohol which is made a big difference  The patient returns after 2 months.  Since I last saw her the patient was admitted in  the beginning of October for atrial fibrillation.  She was put on amiodarone.  Since then she has been feeling better.  She is eating better and no longer has diarrhea or abdominal pain.  She still only had 105 pounds even though she states that she is eating through the day and also using Ensure.  She had a lot of lab work done yesterday all of it basically normal.  While in the hospital Prozac was discontinued and she went back to Lexapro.  I could not find any explanation for this in the notes neither could her husband.  Nevertheless her mood is actually pretty good and she has had very few crying spells.  She seems upbeat and she is sleeping well and her appetite has improved.  She has had no manic symptoms such as racing thoughts anger or irritability  Visit Diagnosis:    ICD-10-CM   1. Major depressive disorder, recurrent episode, moderate ([place])  F33.1       Past Psychiatric History: Past hospitalizations for depression and alcohol abuse, none since 2010  Past Medical History:  Past Medical History:  Diagnosis Date   Anal carcinoma ([place])    Chemo and radiation   Anemia    Anxiety    Dr. Harrington Challenger at [place] (psychiatrist).     Carotid artery disease (HCC)    DDD (degenerative disc disease), lumbosacral    GERD (gastroesophageal reflux disease)    Headache(784.0)    History of adenomatous polyp of colon 10/07/2015   Tubular adenoma high grade dysplasia  History of herpes genitalis    History of kidney stones    History of suicide attempt 2010   HTN (hypertension)    Hyperlipidemia    IBS (irritable bowel syndrome)    Major depression    OA (osteoarthritis)    Ovarian cancer ([place])    Stage IIIB papillary ovarian carcinoma  s/p omentectomy and BSO & chemotherapy (completed 12-07-2014)   Prolapse of vaginal vault after hysterectomy 07/20/2015   Rectovaginal fistula 08/05/2015   Seasonal allergies    Sigmoid diverticulosis    Smokers' cough [facility])     Past Surgical History:   Procedure Laterality Date   BIOPSY  10/12/2020   Procedure: BIOPSY;  Surgeon: Harvel Quale, MD;  [facility];  Service: Gastroenterology;;   [place]   COLONOSCOPY N/A 10/07/2015   Procedure: COLONOSCOPY;  Surgeon: Rogene Houston, MD;  [facility];  Service: Endoscopy;  Laterality: N/A;  7:30   COLONOSCOPY WITH PROPOFOL N/A 10/12/2020   Procedure: COLONOSCOPY WITH PROPOFOL;  Surgeon: Harvel Quale, MD;  [facility];  Service: Gastroenterology;  Laterality: N/A;  9:00   ESOPHAGOGASTRODUODENOSCOPY (EGD) WITH PROPOFOL N/A 10/12/2020   Procedure: ESOPHAGOGASTRODUODENOSCOPY (EGD) WITH PROPOFOL;  Surgeon: Harvel Quale, MD;  [facility];  Service: Gastroenterology;  Laterality: N/A;   EXPLORATORY LAPAROTOMY/ OMENTECTOMY/  BILATERAL SALPINGOOPHORECTOMY/  PORT-A-CATH PLACEMENT  07-03-2014   [place]   KNEE ARTHROSCOPY Left 09-22-2004   LAPAROSCOPY N/A 06/02/2014   Procedure: DIAGNOSTIC LAPAROSCOPY, OMENTAL BIOPSY, RIGHT OVARY BIOPSY, LYSIS OF ADHESIONS;  Surgeon: Fanny Skates, MD;  Location: [facility];  Service: General;  Laterality: N/A;   MUCOSAL ADVANCEMENT FLAP N/A 06/15/2016   Procedure: [place] FISTULA WITH MUCOSAL ADVANCEMENT FLAP;  Surgeon: Leighton Ruff, MD;  Location: [place];  Service: General;  Laterality: N/A;   [place] N/A 09/09/2015   Procedure: PLACEMENT OF SETON;  Surgeon: Leighton Ruff, MD;  Location: [facility];  Service: General;  Laterality: N/A;   PORT-A-CATH REMOVAL Right 08/17/2014   Procedure: REMOVAL INTRAPERITONEAL CHEMO PORT;  Surgeon: Fanny Skates, MD;  Location: [place];  Service: General;  Laterality: Right;   PORTACATH PLACEMENT Right 08/17/2014   Procedure:  PLACE NEW PORT A CATH;  Surgeon: Fanny Skates, MD;  Location: [place];  Service:  General;  Laterality: Right;   RECTAL BIOPSY N/A 09/09/2015   Procedure: BIOPSY OF RECTOVAGINAL MASS;  Surgeon: Leighton Ruff, MD;  Location: [place];  Service: General;  Laterality: N/A;   TUBAL LIGATION  YRS AGO   VAGINAL HYSTERECTOMY  1981   fibroids    Family Psychiatric History: see below  Family History:  Family History  Problem Relation Age of Onset   Bipolar disorder Mother    Anxiety disorder Mother    Dementia Mother    Depression Mother    Mental illness Mother    Vision loss Mother    Alzheimer's disease Mother    Alcohol abuse Paternal Uncle    Bipolar disorder Maternal Grandmother    Dementia Maternal Grandmother    Alzheimer's disease Maternal Grandmother    Alcohol abuse Paternal Uncle    Stroke Brother    Deep vein thrombosis Son    Heart disease Father    Hyperlipidemia Father    Hypertension Father    Stroke Father    Vision loss Father  Atrial fibrillation Sister    Heart disease Brother    Heart disease Brother    ADD / ADHD Neg Hx    Drug abuse Neg Hx    OCD Neg Hx    Paranoid behavior Neg Hx    Schizophrenia Neg Hx    Seizures Neg Hx    Sexual abuse Neg Hx    Physical abuse Neg Hx     Social History:  Social History   Socioeconomic History   Marital status: Married    Spouse name: Coralyn Mark   Number of children: 3   Years of education: 12   Highest education level: Not on file  Occupational History   Occupation: retire    Comment: [employer]  Tobacco Use   Smoking status: Every Day    Packs/day: 0.50    Years: 43.00    Pack years: 21.50    Types: Cigarettes   Smokeless tobacco: Never  Vaping Use   Vaping Use: Never used  Substance and Sexual Activity   Alcohol use: No   Drug use: No   Sexual activity: Not Currently    Birth control/protection: Surgical    Comment: hyst  Other Topics Concern   Not on file  Social History Narrative   Lives at home with Coralyn Mark   Retired [employer]   Social Determinants  of Health   Financial Resource Strain: Low Risk    Difficulty of Paying Living Expenses: Not very hard  Food Insecurity: Not on file  Transportation Needs: Not on file  Physical Activity: Not on file  Stress: Not on file  Social Connections: Not on file    Allergies:  Allergies  Allergen Reactions   Morphine And Related     Delirium with sbo    Metabolic Disorder Labs: No results found for: HGBA1C, MPG No results found for: PROLACTIN Lab Results  Component Value Date   CHOL 140 06/29/2021   TRIG 76 06/29/2021   HDL 51 06/29/2021   CHOLHDL 2.7 06/29/2021   VLDL 15 06/29/2021   LDLCALC 74 06/29/2021   LDLCALC 80 01/10/2021     Therapeutic Level Labs: No results found for: LITHIUM No results found for: VALPROATE No components found for:  CBMZ  Current Medications: Current Outpatient Medications  Medication Sig Dispense Refill   escitalopram (LEXAPRO) 20 MG tablet Take 1 tablet (20 mg total) by mouth 2 (two) times daily. 60 tablet 2   amiodarone (PACERONE) 200 MG tablet Take 1 tablet (200 mg total) by mouth See admin instructions. Amiodarone 1 Tablet 200 mg Twice Daily x 7 days, then 1 tab (200 mg) Daily after that 45 tablet 1   apixaban (ELIQUIS) 5 MG TABS tablet Take 1 tablet (5 mg total) by mouth 2 (two) times daily. 60 tablet 5   b complex vitamins tablet Take 1 tablet by mouth daily.     Cholecalciferol (VITAMIN D-3) 125 MCG (5000 UT) TABS Take 5,000 Units by mouth daily.     Coenzyme Q10 (CO Q 10) 100 MG CAPS Take 100 mg by mouth daily.     divalproex (DEPAKOTE ER) 500 MG 24 hr tablet Take 1 tablet (500 mg total) by mouth daily. 90 tablet 3   furosemide (LASIX) 40 MG tablet Take 1 tablet (40 mg total) by mouth daily. 30 tablet 3   HYDROcodone-acetaminophen (NORCO) 5-325 MG tablet Take 1 tablet by mouth every 6 (six) hours as needed for moderate pain. 30 tablet 0   KRILL OIL PO Take 350  mg by mouth daily.     LORazepam (ATIVAN) 0.5 MG tablet Take 1 tablet (0.5 mg  total) by mouth 3 (three) times daily as needed for anxiety. 90 tablet 2   Multiple Vitamin (MULTIVITAMIN) tablet Take 1 tablet by mouth daily.     Multiple Vitamins-Minerals (HAIR SKIN & NAILS ADVANCED PO) Take by mouth.     nicotine (NICODERM CQ - DOSED IN MG/24 HOURS) 14 mg/24hr patch Place 1 patch (14 mg total) onto the skin daily. 28 patch 0   omeprazole (PRILOSEC) 40 MG capsule Take 1 capsule (40 mg total) by mouth daily. 90 capsule 3   ondansetron (ZOFRAN) 4 MG tablet TAKE 1 TABLET BY MOUTH EVERY 8 HOURS AS NEEDED FOR NAUSEA AND VOMITING (Patient taking differently: Take 4 mg by mouth every 8 (eight) hours as needed for nausea or vomiting.) 30 tablet 1   OVER THE COUNTER MEDICATION Take 1 tablet by mouth in the morning and at bedtime. Patient reports that she is taking a ViViscal Tablet by mouth twice daily for hair loss.     polyethylene glycol (MIRALAX / GLYCOLAX) 17 g packet Take 17 g by mouth daily. (Patient taking differently: Take 17 g by mouth daily as needed for moderate constipation.) 30 each 1   rosuvastatin (CRESTOR) 20 MG tablet Take 1 tablet (20 mg total) by mouth daily. 90 tablet 3   No current facility-administered medications for this visit.     Musculoskeletal: Strength & Muscle Tone: na Gait & Station: na Patient leans: N/A  Psychiatric Specialty Exam: Review of Systems  Constitutional:  Positive for unexpected weight change.  Gastrointestinal:  Positive for constipation.  Musculoskeletal:  Positive for back pain and myalgias.  Psychiatric/Behavioral:  The patient is nervous/anxious.   All other systems reviewed and are negative.  There were no vitals taken for this visit.There is no height or weight on file to calculate BMI.  General Appearance: NA  Eye Contact:  NA  Speech:  Clear and Coherent  Volume:  Normal  Mood:  Anxious and Euthymic  Affect:  NA  Thought Process:  Goal Directed  Orientation:  Full (Time, Place, and Person)  Thought Content: WDL    Suicidal Thoughts:  No  Homicidal Thoughts:  No  Memory:  Immediate;   Good Recent;   Good Remote;   NA  Judgement:  Good  Insight:  Fair  Psychomotor Activity:  Decreased  Concentration:  Concentration: Good and Attention Span: Good  Recall:  Good  Fund of Knowledge: Good  Language: Good  Akathisia:  No  Handed:  Right  AIMS (if indicated): not done  Assets:  Communication Skills Desire for Improvement Resilience Social Support Talents/Skills  ADL's:  Intact  Cognition: WNL  Sleep:  Good   Screenings: GAD-7    Flowsheet Row Clinical Support from 01/10/2021 in [facility]  Total GAD-7 Score 8      PHQ2-9    Flowsheet Row Video Visit from 07/26/2021 in [facility] [place] [facility] Video Visit from 05/27/2021 in [place] [facility] from 05/09/2021 in [place] [facility] Video Visit from 04/29/2021 in [place] [facility] from 04/11/2021 in [place] [facility]  PHQ-2 Total Score 1 1 3 3 6   PHQ-9 Total Score 3 3 10 10 21       Flowsheet Row Video Visit from 07/26/2021 in [place] [facility] to Hosp-Admission (Discharged) from 06/28/2021 in [place]  UNIT Video Visit from 05/27/2021 in [place] CATEGORY Error: Q3, 4, or 5 should not be populated when Q2 is No No Risk Error: Question 6 not populated        Assessment and Plan: This patient is a 70 year old female with a history depression and anxiety.  She has had recent significant medical problems including diverticulitis atrial fibrillation and leg swelling.  For what ever reason the hospital changed her back to Lexapro 20 mg twice daily and this seems to be working well for her depression.  She will continue this as well as  Ativan 0.5 mg 3 times daily as needed for anxiety and Depakote ER 500 mg at bedtime for mood stabilization.  She will return to see me in 6 weeks   [name], MD 07/26/2021, 10:15 AM

## 2021-07-26 NOTE — Patient Instructions (Signed)
Medication Instructions:  Your physician recommends that you continue on your current medications as directed. Please refer to the Current Medication list given to you today.  *If you need a refill on your cardiac medications before your next appointment, please call your pharmacy*   Lab Work: NONE   If you have labs (blood work) drawn today and your tests are completely normal, you will receive your results only by: . MyChart Message (if you have MyChart) OR . A paper copy in the mail If you have any lab test that is abnormal or we need to change your treatment, we will call you to review the results.   Testing/Procedures: NONE    Follow-Up: At [facility], you and your health needs are our priority.  As part of our continuing mission to provide you with exceptional heart care, we have created designated Provider Care Teams.  These Care Teams include your primary Cardiologist (physician) and Advanced Practice Providers (APPs -  Physician Assistants and Nurse Practitioners) who all work together to provide you with the care you need, when you need it.  We recommend signing up for the patient portal called "MyChart".  Sign up information is provided on this After Visit Summary.  MyChart is used to connect with patients for Virtual Visits (Telemedicine).  Patients are able to view lab/test results, encounter notes, upcoming appointments, etc.  Non-urgent messages can be sent to your provider as well.   To learn more about what you can do with MyChart, go to [email or web address].    Your next appointment:   4 month(s)  The format for your next appointment:   In Person  Provider:   Samuel McDowell, MD   Other Instructions Thank you for choosing [facility]!    

## 2021-07-28 ENCOUNTER — Telehealth: Payer: Self-pay | Admitting: Pharmacist

## 2021-07-28 NOTE — Progress Notes (Signed)
Chronic Care Management Pharmacy Assistant   Name: Abigail Wagner  MRN: [id number] DOB: 1951/02/06  Reason for Encounter: Disease State For HTN.    Conditions to be addressed/monitored: HTN, Migraines, GERD, Depression/Bipolar, HLD  Recent office visits:  06/28/21 Dr. Dennard Schaumann For rapid heart beat. No medication changes.   Recent consult visits:  07/26/21 Cardiology Imogene Burn, PA-C.For hospital follow-up. No medication changes. 07/26/21 Behavioral Health For major depressive disorder. No more information given.  Hospital visits:  06/28/21 [facility] (2 Days) Roxan Hockey, MD. For Atrial Fib.Per note:Take Amiodarone 1 Tablet 200 mg Twice Daily x 7 days, then 1 tab (200 mg) Daily after that  Medications: Outpatient Encounter Medications as of 07/28/2021  Medication Sig Note   amiodarone (PACERONE) 200 MG tablet Take 1 tablet (200 mg total) by mouth See admin instructions. Amiodarone 1 Tablet 200 mg Twice Daily x 7 days, then 1 tab (200 mg) Daily after that    apixaban (ELIQUIS) 5 MG TABS tablet Take 1 tablet (5 mg total) by mouth 2 (two) times daily.    b complex vitamins tablet Take 1 tablet by mouth daily.    Cholecalciferol (VITAMIN D-3) 125 MCG (5000 UT) TABS Take 5,000 Units by mouth daily.    Coenzyme Q10 (CO Q 10) 100 MG CAPS Take 100 mg by mouth daily.    divalproex (DEPAKOTE ER) 500 MG 24 hr tablet Take 1 tablet (500 mg total) by mouth daily.    escitalopram (LEXAPRO) 20 MG tablet Take 1 tablet (20 mg total) by mouth 2 (two) times daily.    furosemide (LASIX) 40 MG tablet Take 1 tablet (40 mg total) by mouth daily.    HYDROcodone-acetaminophen (NORCO) 5-325 MG tablet Take 1 tablet by mouth every 6 (six) hours as needed for moderate pain.    KRILL OIL PO Take 350 mg by mouth daily.    LORazepam (ATIVAN) 0.5 MG tablet Take 1 tablet (0.5 mg total) by mouth 3 (three) times daily as needed for anxiety.    Multiple Vitamin (MULTIVITAMIN) tablet Take 1 tablet by mouth  daily.    Multiple Vitamins-Minerals (HAIR SKIN & NAILS ADVANCED PO) Take by mouth.    nicotine (NICODERM CQ - DOSED IN MG/24 HOURS) 14 mg/24hr patch Place 1 patch (14 mg total) onto the skin daily.    omeprazole (PRILOSEC) 40 MG capsule Take 1 capsule (40 mg total) by mouth daily.    ondansetron (ZOFRAN) 4 MG tablet TAKE 1 TABLET BY MOUTH EVERY 8 HOURS AS NEEDED FOR NAUSEA AND VOMITING (Patient taking differently: Take 4 mg by mouth every 8 (eight) hours as needed for nausea or vomiting.)    OVER THE COUNTER MEDICATION Take 1 tablet by mouth in the morning and at bedtime. Patient reports that she is taking a ViViscal Tablet by mouth twice daily for hair loss.    polyethylene glycol (MIRALAX / GLYCOLAX) 17 g packet Take 17 g by mouth daily. (Patient taking differently: Take 17 g by mouth daily as needed for moderate constipation.) 10/06/2020: Patient and Husband report that she takes as needed.   rosuvastatin (CRESTOR) 20 MG tablet Take 1 tablet (20 mg total) by mouth daily.    No facility-administered encounter medications on file as of 07/28/2021.   Reviewed chart prior to disease state call. Spoke with patient regarding BP  Recent Office Vitals: BP Readings from Last 3 Encounters:  07/26/21 106/64  07/25/21 (!) 116/52  06/30/21 138/66   Pulse Readings from Last 3  Encounters:  07/26/21 82  07/25/21 73  06/30/21 62    Wt Readings from Last 3 Encounters:  07/26/21 107 lb 6.4 oz (48.7 kg)  06/30/21 123 lb 10.9 oz (56.1 kg)  06/28/21 124 lb (56.2 kg)     Kidney Function Lab Results  Component Value Date/Time   CREATININE 0.82 07/25/2021 03:11 PM   CREATININE 0.86 06/29/2021 05:19 AM   CREATININE 0.94 06/03/2021 09:58 AM   CREATININE 0.88 05/20/2021 09:40 AM   GFRNONAA >60 07/25/2021 03:11 PM   GFRNONAA 78 03/07/2021 03:57 PM   GFRAA 91 03/07/2021 03:57 PM    BMP Latest Ref Rng & Units 07/25/2021 06/29/2021 06/28/2021  Glucose 70 - 99 mg/dL 97 77 97  BUN 8 - 23 mg/dL 26(H) 19  26(H)  Creatinine 0.44 - 1.00 mg/dL 0.82 0.86 1.10(H)  BUN/Creat Ratio 6 - 22 (calc) - - -  Sodium 135 - 145 mmol/L 136 143 138  Potassium 3.5 - 5.1 mmol/L 4.1 3.7 3.8  Chloride 98 - 111 mmol/L 99 112(H) 102  CO2 22 - 32 mmol/L 28 24 25   Calcium 8.9 - 10.3 mg/dL 9.1 7.9(L) 8.7(L)    Current antihypertensive regimen:  Losartan 50mg  daily  How often are you checking your Blood Pressure? The patients husband stated he checks her blood pressure nearly every single day.   Current home BP readings: The patients husband stated her blood pressure is in the low range but still normal.  What recent interventions/DTPs have been made by any provider to improve Blood Pressure control since last CPP Visit: None.  Any recent hospitalizations or ED visits since last visit with CPP? Yes, documented above.   What diet changes have been made to improve Blood Pressure Control?  Patients husband stated sometimes he has to encourage her to eat but she still eats fairly good.   What exercise is being done to improve your Blood Pressure Control?  Patients husband stated she is pretty active.  Adherence Review: Is the patient currently on ACE/ARB medication? N/A.  Does the patient have >5 day gap between last estimated fill dates? Per misc rpts, no.   Patients husband stated her weight is 106 and has currently stabilize.   Care Gaps:Not on the list.  Star Rating Drugs: Rosuvastatin 20 mg 07/24/21 90 DS.  Follow-Up:Pharmacist Review  [name], [place] Pharmacist Assistant [phone]

## 2021-07-30 NOTE — Progress Notes (Signed)
[place], [place] [address]   CLINIC:  Medical Oncology/Hematology  PCP:  Susy Frizzle, MD [address] [place] [address] [phone]   REASON FOR VISIT:  Follow-up for anal cancer and ovarian cancer  PRIOR THERAPY:  1. Debulking surgery with BSO on 07/04/2014. 2. Chemoradiation therapy with 5-FU and mitomycin completed on 12/06/2015.  NGS Results: not done  CURRENT THERAPY: surveillance  BRIEF ONCOLOGIC HISTORY:  Oncology History  History of ovarian cancer  06/02/2014 Procedure   Diagnostic laparoscopy, lysis of adhesions, biopsy of omental nodules, biopsy right ovary by Dr Dalbert Batman   06/04/2014 Pathology Results   Diagnosis 1. Omentum, biopsy, Anterior peritoneal & omental nodule - SEROUS CARCINOMA WITH ASSOCIATED ABUNDANT PSAMMOMA BODIES AND DESMOPLASTIC STROMAL REACTION CONSISTENT WITH INVASIVE IMPLANTS. - SEE COMMENT. 2. Omentum, biopsy, Omental nodule - SEROUS CARCINOMA WITH ASSOCIATED ABUNDANT PSAMMOMA BODIES AND DESMOPLASTIC STROMAL REACTION CONSISTENT WITH INVASIVE IMPLANTS. - SEE COMMENT. 3. Ovary, biopsy/wedge resection, Right ovary - SMALL FRAGMENTS OF ATYPICAL PAPILLARY PROLIFERATION WITH PSAMMOMA BODIES CONSISTENT WITH AT LEAST SEROUS BORDERLINE TUMOR.   07/03/2014 Procedure   Exploratory laparotomy, omentectomy, Bilateral salpingo-oophorectomy, inptraperitoneal PORT placement by Dr. Alycia Rossetti       07/24/2014 - 12/07/2014 Chemotherapy   Carboplatin/Paclitaxel x 6 cycles    08/17/2014 Procedure   Insertion of 8 French power port clear view tunneled venous vascular access device next line use of fluoroscopy for guidance and positioning Use of ultrasound for venipuncture Removal of intraperitoneal chemotherapy port By Dr. Dalbert Batman   05/14/2017 Genetic Testing   Negative genetic testing on the [facility] panel.  The [facility] gene panel offered by [employer] includes sequencing and  deletion/duplication testing of the following 28 genes: APC, ATM, BARD1, BMPR1A, BRCA1, BRCA2, BRIP1, CHD1, CDK4, CDKN2A, CHEK2, EPCAM (large rearrangement only), MLH1, MSH2, MSH6, MUTYH, NBN, PALB2, PMS2, PTEN, RAD51C, RAD51D, SMAD4, STK11, and TP53. Sequencing was performed for select regions of POLE and POLD1, and large rearrangement analysis was performed for select regions of GREM1. The report date is April 27, 2017.  HRD testing was performed on the ovarian tumor.  The tumor was negative for any deleterious BRCA1 or BRCA2 mutations.    Genomic instability status was not interpretable.  Therefore, [facility] was unable to analyze the ovarian tumor for genomic instability.  The report date is May 14, 2017.    Anal cancer ([place])  09/09/2015 Procedure   Rectovaginal septal mass biopsy by Dr. Marcello Moores   09/10/2015 Pathology Results   Soft tissue mass, biopsy, rectovaginal septal mass - SQUAMOUS CELL CARCINOMA.   10/15/2015 PET scan   1. Focal hypermetabolism with associated ill-defined soft tissue fullness at the junction of the anterior anal wall and posterior inferior vaginal wall, in keeping with the provided history of a primary anal carcinoma. 2. No hypermetabolic locoregional or distant metastatic disease. 3. Status post hysterectomy, with no abnormal findings at the vaginal cuff. No evidence of hypermetabolic peritoneal tumor. 4. Tiny nonobstructing bilateral renal stones.   10/25/2015 - 12/06/2015 Radiation Therapy   [place], [place]   10/25/2015 - 12/06/2015 Chemotherapy   Mitomycin C and 5FU, by Dr. Jacquiline Doe    06/15/2016 Procedure   EXCISION RECTOVAGINAOL FISTULA WITH MUCOSAL ADVANCEMENT FLAP by Dr. Marcello Moores   06/18/2016 Pathology Results   Fistula, rectovaginal SQUAMOUS, COLUMNAR JUNCTION MUCOSA AND SUBCUTANOUS SOFT TISSUE WITH FISTULA NO EVIDENCE OF MALIGNANCY   09/20/2016 Imaging   CT CAP- 1. No acute findings and no  evidence for recurrent tumor or metastatic disease. 2.  Aortic atherosclerosis and coronary artery calcification     CANCER STAGING: Cancer Staging Anal cancer ([place]) Staging form: Anus, AJCC 8th Edition - Clinical: Stage IIA (cT2, cN0, cM0) - Signed by Baird Cancer, PA-C on 09/27/2016   INTERVAL HISTORY:  Abigail Wagner, a 70 y.o. female, returns for routine follow-up of her for anal cancer and ovarian cancer. Abigail Wagner was last seen on 12/21/2020.   Today she reports feeling well. She denies constipation, current bleeding, and black stools. She is not currently taking iron tablets.   REVIEW OF SYSTEMS:  Review of Systems  Constitutional:  Positive for fatigue (40%). Negative for appetite change (50%).  HENT:   Negative for nosebleeds.   Respiratory:  Negative for hemoptysis.   Gastrointestinal:  Negative for blood in stool and constipation.  Genitourinary:  Negative for hematuria and vaginal bleeding.   Musculoskeletal:  Positive for back pain (4/10 lower).  Neurological:  Positive for headaches.  Hematological:  Does not bruise/bleed easily.  All other systems reviewed and are negative.  PAST MEDICAL/SURGICAL HISTORY:  Past Medical History:  Diagnosis Date   Anal carcinoma ([place])    Chemo and radiation   Anemia    Anxiety    Dr. Harrington Challenger at [place] (psychiatrist).     Carotid artery disease (HCC)    DDD (degenerative disc disease), lumbosacral    GERD (gastroesophageal reflux disease)    Headache(784.0)    History of adenomatous polyp of colon 10/07/2015   Tubular adenoma high grade dysplasia   History of herpes genitalis    History of kidney stones    History of suicide attempt 2010   HTN (hypertension)    Hyperlipidemia    IBS (irritable bowel syndrome)    Major depression    OA (osteoarthritis)    Ovarian cancer ([place])    Stage IIIB papillary ovarian carcinoma  s/p omentectomy and BSO & chemotherapy (completed 12-07-2014)   Prolapse of vaginal vault after hysterectomy 07/20/2015   Rectovaginal fistula 08/05/2015    Seasonal allergies    Sigmoid diverticulosis    Smokers' cough [facility])    Past Surgical History:  Procedure Laterality Date   BIOPSY  10/12/2020   Procedure: BIOPSY;  Surgeon: Harvel Quale, MD;  [facility];  Service: Gastroenterology;;   BREAST ENHANCEMENT [place]   COLONOSCOPY N/A 10/07/2015   Procedure: COLONOSCOPY;  Surgeon: Rogene Houston, MD;  [facility];  Service: Endoscopy;  Laterality: N/A;  7:30   COLONOSCOPY WITH PROPOFOL N/A 10/12/2020   Procedure: COLONOSCOPY WITH PROPOFOL;  Surgeon: Harvel Quale, MD;  [facility];  Service: Gastroenterology;  Laterality: N/A;  9:00   ESOPHAGOGASTRODUODENOSCOPY (EGD) WITH PROPOFOL N/A 10/12/2020   Procedure: ESOPHAGOGASTRODUODENOSCOPY (EGD) WITH PROPOFOL;  Surgeon: Harvel Quale, MD;  [facility];  Service: Gastroenterology;  Laterality: N/A;   EXPLORATORY LAPAROTOMY/ OMENTECTOMY/  BILATERAL SALPINGOOPHORECTOMY/  PORT-A-CATH PLACEMENT  07-03-2014   [place]   KNEE ARTHROSCOPY Left 09-22-2004   LAPAROSCOPY N/A 06/02/2014   Procedure: DIAGNOSTIC LAPAROSCOPY, OMENTAL BIOPSY, RIGHT OVARY BIOPSY, LYSIS OF ADHESIONS;  Surgeon: Fanny Skates, MD;  Location: [facility];  Service: General;  Laterality: N/A;   MUCOSAL ADVANCEMENT FLAP N/A 06/15/2016   Procedure: EXCISION RECTOVAGINAOL FISTULA WITH MUCOSAL ADVANCEMENT FLAP;  Surgeon: Leighton Ruff, MD;  Location: [facility];  Service: General;  Laterality: N/A;   [place]  N/A 09/09/2015   Procedure: PLACEMENT OF SETON;  Surgeon: Leighton Ruff, MD;  [facility];  Service: General;  Laterality: N/A;   PORT-A-CATH REMOVAL Right 08/17/2014   Procedure: REMOVAL INTRAPERITONEAL CHEMO PORT;  Surgeon: Fanny Skates, MD;  Location: [facility];  Service: General;  Laterality: Right;   PORTACATH PLACEMENT Right 08/17/2014   Procedure:  PLACE  NEW PORT A CATH;  Surgeon: Fanny Skates, MD;  Location: [facility];  Service: General;  Laterality: Right;   RECTAL BIOPSY N/A 09/09/2015   Procedure: BIOPSY OF RECTOVAGINAL MASS;  Surgeon: Leighton Ruff, MD;  Location: [facility];  Service: General;  Laterality: N/A;   TUBAL LIGATION  YRS AGO   VAGINAL HYSTERECTOMY  1981   fibroids    SOCIAL HISTORY:  Social History   Socioeconomic History   Marital status: Married    Spouse name: Coralyn Mark   Number of children: 3   Years of education: 12   Highest education level: Not on file  Occupational History   Occupation: retire    Comment: [employer]  Tobacco Use   Smoking status: Former    Packs/day: 0.50    Years: 43.00    Pack years: 21.50    Types: Cigarettes    Quit date: 06/25/2021    Years since quitting: 0.0   Smokeless tobacco: Never  Vaping Use   Vaping Use: Never used  Substance and Sexual Activity   Alcohol use: No   Drug use: No   Sexual activity: Not Currently    Birth control/protection: Surgical    Comment: hyst  Other Topics Concern   Not on file  Social History Narrative   Lives at home with Coralyn Mark   Retired [employer]   Social Determinants of Health   Financial Resource Strain: Low Risk    Difficulty of Paying Living Expenses: Not very hard  Food Insecurity: Not on file  Transportation Needs: Not on file  Physical Activity: Not on file  Stress: Not on file  Social Connections: Not on file  Intimate Partner Violence: Not on file    FAMILY HISTORY:  Family History  Problem Relation Age of Onset   Bipolar disorder Mother    Anxiety disorder Mother    Dementia Mother    Depression Mother    Mental illness Mother    Vision loss Mother    Alzheimer's disease Mother    Alcohol abuse Paternal Uncle    Bipolar disorder Maternal Grandmother    Dementia Maternal Grandmother    Alzheimer's disease Maternal Grandmother    Alcohol abuse Paternal Uncle    Stroke Brother     Deep vein thrombosis Son    Heart disease Father    Hyperlipidemia Father    Hypertension Father    Stroke Father    Vision loss Father    Atrial fibrillation Sister    Heart disease Brother    Heart disease Brother    ADD / ADHD Neg Hx    Drug abuse Neg Hx    OCD Neg Hx    Paranoid behavior Neg Hx    Schizophrenia Neg Hx    Seizures Neg Hx    Sexual abuse Neg Hx    Physical abuse Neg Hx     CURRENT MEDICATIONS:  Current Outpatient Medications  Medication Sig Dispense Refill   amiodarone (PACERONE) 200 MG tablet Take 1 tablet (200 mg total) by mouth See admin instructions. Amiodarone 1 Tablet 200 mg  Twice Daily x 7 days, then 1 tab (200 mg) Daily after that 45 tablet 1   apixaban (ELIQUIS) 5 MG TABS tablet Take 1 tablet (5 mg total) by mouth 2 (two) times daily. 60 tablet 5   b complex vitamins tablet Take 1 tablet by mouth daily.     Cholecalciferol (VITAMIN D-3) 125 MCG (5000 UT) TABS Take 5,000 Units by mouth daily.     Coenzyme Q10 (CO Q 10) 100 MG CAPS Take 100 mg by mouth daily.     divalproex (DEPAKOTE ER) 500 MG 24 hr tablet Take 1 tablet (500 mg total) by mouth daily. 90 tablet 3   escitalopram (LEXAPRO) 20 MG tablet Take 1 tablet (20 mg total) by mouth 2 (two) times daily. 60 tablet 2   furosemide (LASIX) 40 MG tablet Take 1 tablet (40 mg total) by mouth daily. 30 tablet 3   HYDROcodone-acetaminophen (NORCO) 5-325 MG tablet Take 1 tablet by mouth every 6 (six) hours as needed for moderate pain. 30 tablet 0   KRILL OIL PO Take 350 mg by mouth daily.     LORazepam (ATIVAN) 0.5 MG tablet Take 1 tablet (0.5 mg total) by mouth 3 (three) times daily as needed for anxiety. 90 tablet 2   Multiple Vitamin (MULTIVITAMIN) tablet Take 1 tablet by mouth daily.     Multiple Vitamins-Minerals (HAIR SKIN & NAILS ADVANCED PO) Take by mouth.     nicotine (NICODERM CQ - DOSED IN MG/24 HOURS) 14 mg/24hr patch Place 1 patch (14 mg total) onto the skin daily. 28 patch 0   omeprazole  (PRILOSEC) 40 MG capsule Take 1 capsule (40 mg total) by mouth daily. 90 capsule 3   ondansetron (ZOFRAN) 4 MG tablet TAKE 1 TABLET BY MOUTH EVERY 8 HOURS AS NEEDED FOR NAUSEA AND VOMITING (Patient taking differently: Take 4 mg by mouth every 8 (eight) hours as needed for nausea or vomiting.) 30 tablet 1   OVER THE COUNTER MEDICATION Take 1 tablet by mouth in the morning and at bedtime. Patient reports that she is taking a ViViscal Tablet by mouth twice daily for hair loss.     polyethylene glycol (MIRALAX / GLYCOLAX) 17 g packet Take 17 g by mouth daily. (Patient taking differently: Take 17 g by mouth daily as needed for moderate constipation.) 30 each 1   rosuvastatin (CRESTOR) 20 MG tablet Take 1 tablet (20 mg total) by mouth daily. 90 tablet 3   No current facility-administered medications for this visit.    ALLERGIES:  Allergies  Allergen Reactions   Morphine And Related     Delirium with sbo    PHYSICAL EXAM:  Performance status (ECOG): 1 - Symptomatic but completely ambulatory  There were no vitals filed for this visit. Wt Readings from Last 3 Encounters:  07/26/21 107 lb 6.4 oz (48.7 kg)  06/30/21 123 lb 10.9 oz (56.1 kg)  06/28/21 124 lb (56.2 kg)   Physical Exam Vitals reviewed.  Constitutional:      Appearance: Normal appearance.  Cardiovascular:     Rate and Rhythm: Normal rate and regular rhythm.     Pulses: Normal pulses.     Heart sounds: Normal heart sounds.  Pulmonary:     Effort: Pulmonary effort is normal.     Breath sounds: Normal breath sounds.  Abdominal:     Palpations: Abdomen is soft. There is no hepatomegaly, splenomegaly or mass.     Tenderness: There is no abdominal tenderness.  Lymphadenopathy:  Cervical: No cervical adenopathy.     Right cervical: No superficial cervical adenopathy.    Left cervical: No superficial cervical adenopathy.     Upper Body:     Right upper body: No supraclavicular, axillary or pectoral adenopathy.     Left upper  body: No supraclavicular, axillary or pectoral adenopathy.     Lower Body: No right inguinal adenopathy. No left inguinal adenopathy.  Neurological:     General: No focal deficit present.     Mental Status: She is alert and oriented to person, place, and time.  Psychiatric:        Mood and Affect: Mood normal.        Behavior: Behavior normal.     LABORATORY DATA:  I have reviewed the labs as listed.  CBC Latest Ref Rng & Units 07/25/2021 06/30/2021 06/29/2021  WBC 4.0 - 10.5 K/uL 4.2 3.2(L) 3.2(L)  Hemoglobin 12.0 - 15.0 g/dL 12.0 10.5(L) 11.3(L)  Hematocrit 36.0 - 46.0 % 36.1 33.0(L) 35.3(L)  Platelets 150 - 400 K/uL 253 147(L) 158   CMP Latest Ref Rng & Units 07/25/2021 06/29/2021 06/28/2021  Glucose 70 - 99 mg/dL 97 77 97  BUN 8 - 23 mg/dL 26(H) 19 26(H)  Creatinine 0.44 - 1.00 mg/dL 0.82 0.86 1.10(H)  Sodium 135 - 145 mmol/L 136 143 138  Potassium 3.5 - 5.1 mmol/L 4.1 3.7 3.8  Chloride 98 - 111 mmol/L 99 112(H) 102  CO2 22 - 32 mmol/L _0 Calcium 8.9 - 10.3 mg/dL 9.1 7.9(L) 8.7(L)  Total Protein 6.5 - 8.1 g/dL 7.2 5.4(L) 6.6  Total Bilirubin 0.3 - 1.2 mg/dL 0.5 0.5 0.7  Alkaline Phos 38 - 126 U/L 52 37(L) 43  AST 15 - 41 U/L _1 ALT 0 - 44 U/L _2 DIAGNOSTIC IMAGING:  I have independently reviewed the scans and discussed with the patient. No results found.   ASSESSMENT:  1.  Stage II (T2 N0 M0) anal cancer: -Diagnosed in December 2016. -Chemoradiation therapy with 5-FU and mitomycin completed on 12/06/2015, [facility] at [facility]. -She is continuing follow-ups with Dr. Marcello Moores with anoscopy.   2.  Stage III ovarian cancer: -Diagnosed in 2015, status post debulking surgery followed by 6 cycles of carboplatin and Taxol completed in March 2016.   PLAN:  1.  Stage II (T2 N0 M0) anal cancer: - Last colonoscopy on 10/12/2020 shows diverticulosis in sigmoid colon with no other findings on perianal exam. - Inguinal lymph nodes were not palpable.    2.  Stage III ovarian cancer: - Last CTAP on 05/25/2020 did not show any evidence of mass or recurrence. - CA125 was 18.3.  No abdominal symptoms.   3.  Normocytic anemia: - Labs today shows hemoglobin 12.  Ferritin is 60 and percent saturation is 21. - She was told to start iron tablet 3 times weekly.   Orders placed this encounter:  No orders of the defined types were placed in this encounter.    Derek Jack, MD [place] [phone]   I, Thana Ates, am acting as a scribe for Dr. Derek Jack.  I, Derek Jack MD, have reviewed the above documentation for accuracy and completeness, and I agree with the above.

## 2021-08-01 ENCOUNTER — Inpatient Hospital Stay (HOSPITAL_COMMUNITY): Payer: Medicare Other | Attending: Hematology | Admitting: Hematology

## 2021-08-01 ENCOUNTER — Other Ambulatory Visit: Payer: Self-pay

## 2021-08-01 ENCOUNTER — Other Ambulatory Visit: Payer: Self-pay | Admitting: Family Medicine

## 2021-08-01 VITALS — BP 134/66 | HR 75 | Temp 98.6°F | Resp 18 | Wt 109.0 lb

## 2021-08-01 DIAGNOSIS — Z9221 Personal history of antineoplastic chemotherapy: Secondary | ICD-10-CM | POA: Insufficient documentation

## 2021-08-01 DIAGNOSIS — Z8543 Personal history of malignant neoplasm of ovary: Secondary | ICD-10-CM | POA: Insufficient documentation

## 2021-08-01 DIAGNOSIS — C569 Malignant neoplasm of unspecified ovary: Secondary | ICD-10-CM

## 2021-08-01 DIAGNOSIS — C210 Malignant neoplasm of anus, unspecified: Secondary | ICD-10-CM | POA: Diagnosis not present

## 2021-08-01 DIAGNOSIS — D649 Anemia, unspecified: Secondary | ICD-10-CM | POA: Diagnosis not present

## 2021-08-01 DIAGNOSIS — Z923 Personal history of irradiation: Secondary | ICD-10-CM | POA: Diagnosis not present

## 2021-08-01 DIAGNOSIS — Z85048 Personal history of other malignant neoplasm of rectum, rectosigmoid junction, and anus: Secondary | ICD-10-CM | POA: Insufficient documentation

## 2021-08-01 NOTE — Patient Instructions (Signed)
Abigail Wagner at [facility] Discharge Instructions  You were seen and examined today by Dr. Delton Coombes. He reviewed your most recent labs and everything looks good. He wants you to take the iron pill every other day if you can tolerate it. Please follow up as scheduled in 6 months.   Thank you for choosing [place] at [facility] to provide your oncology and hematology care.  To afford each patient quality time with our provider, please arrive at least 15 minutes before your scheduled appointment time.   If you have a lab appointment with the [facility] please come in thru the Main Entrance and check in at the main information desk.  You need to re-schedule your appointment should you arrive 10 or more minutes late.  We strive to give you quality time with our providers, and arriving late affects you and other patients whose appointments are after yours.  Also, if you no show three or more times for appointments you may be dismissed from the clinic at the providers discretion.     Again, thank you for choosing [facility].  Our hope is that these requests will decrease the amount of time that you wait before being seen by our physicians.       _____________________________________________________________  Should you have questions after your visit to [facility], please contact our office at [phone] and follow the prompts.  Our office hours are 8:00 a.m. and 4:30 p.m. Monday - Friday.  Please note that voicemails left after 4:00 p.m. may not be returned until the following business day.  We are closed weekends and major holidays.  You do have access to a nurse 24-7, just call the main number to the clinic [phone] and do not press any options, hold on the line and a nurse will answer the phone.    For prescription refill requests, have your pharmacy contact our office and allow 72 hours.    Due to Covid, you will  need to wear a mask upon entering the hospital. If you do not have a mask, a mask will be given to you at the Main Entrance upon arrival. For doctor visits, patients may have 1 support person age 11 or older with them. For treatment visits, patients can not have anyone with them due to social distancing guidelines and our immunocompromised population.

## 2021-08-01 NOTE — Telephone Encounter (Signed)
Ok to refill??  Last office visit 06/28/2021.  Last refill 07/21/2021. 

## 2021-08-02 MED ORDER — HYDROCODONE-ACETAMINOPHEN 5-325 MG PO TABS: 1.0000 | ORAL_TABLET | Freq: Four times a day (QID) | ORAL | 0 refills | Status: DC | PRN

## 2021-08-14 ENCOUNTER — Other Ambulatory Visit: Payer: Self-pay | Admitting: Family Medicine

## 2021-08-15 ENCOUNTER — Other Ambulatory Visit: Payer: Self-pay | Admitting: Family Medicine

## 2021-08-15 ENCOUNTER — Other Ambulatory Visit: Payer: Self-pay

## 2021-08-15 MED ORDER — HYDROCODONE-ACETAMINOPHEN 5-325 MG PO TABS: 1.0000 | ORAL_TABLET | Freq: Four times a day (QID) | ORAL | 0 refills | Status: DC | PRN

## 2021-08-15 NOTE — Telephone Encounter (Signed)
PDMP reviewed.  Appears to take this medication chronically.  Refill given in place of PCP who is out of the office.

## 2021-08-20 ENCOUNTER — Other Ambulatory Visit: Payer: Self-pay | Admitting: Family Medicine

## 2021-08-26 ENCOUNTER — Ambulatory Visit: Payer: Medicare Other | Admitting: Family Medicine

## 2021-08-29 ENCOUNTER — Other Ambulatory Visit: Payer: Self-pay | Admitting: Nurse Practitioner

## 2021-08-29 MED ORDER — HYDROCODONE-ACETAMINOPHEN 5-325 MG PO TABS: 1.0000 | ORAL_TABLET | Freq: Four times a day (QID) | ORAL | 0 refills | Status: DC | PRN

## 2021-09-01 ENCOUNTER — Ambulatory Visit (INDEPENDENT_AMBULATORY_CARE_PROVIDER_SITE_OTHER): Payer: Medicare Other | Admitting: Family Medicine

## 2021-09-01 ENCOUNTER — Encounter: Payer: Self-pay | Admitting: Family Medicine

## 2021-09-01 ENCOUNTER — Other Ambulatory Visit: Payer: Self-pay

## 2021-09-01 VITALS — BP 100/64 | HR 82 | Resp 18 | Ht 65.0 in | Wt 112.0 lb

## 2021-09-01 DIAGNOSIS — M5441 Lumbago with sciatica, right side: Secondary | ICD-10-CM

## 2021-09-01 DIAGNOSIS — I5189 Other ill-defined heart diseases: Secondary | ICD-10-CM

## 2021-09-01 DIAGNOSIS — I4891 Unspecified atrial fibrillation: Secondary | ICD-10-CM | POA: Diagnosis not present

## 2021-09-01 DIAGNOSIS — R5382 Chronic fatigue, unspecified: Secondary | ICD-10-CM | POA: Diagnosis not present

## 2021-09-01 DIAGNOSIS — G8929 Other chronic pain: Secondary | ICD-10-CM

## 2021-09-01 DIAGNOSIS — M5442 Lumbago with sciatica, left side: Secondary | ICD-10-CM | POA: Diagnosis not present

## 2021-09-01 MED ORDER — HYDROCODONE-ACETAMINOPHEN 5-325 MG PO TABS: 1.0000 | ORAL_TABLET | Freq: Three times a day (TID) | ORAL | 0 refills | Status: DC | PRN

## 2021-09-01 NOTE — Progress Notes (Signed)
Subjective:    Patient ID: Abigail Wagner, female    DOB: 31-Dec-1950, 70 y.o.   MRN: [id number]  HPI  When I last saw the patient, she was admitted to the hospital in atrial fibrillation with rapid ventricular response.  Prior to that she had been losing weight, was emaciated, appears weak and frail and fatigued.  Apparently she was having episodic atrial fibrillation.  However now, patient states she feels much better.  She is stronger.  Peak is improving.  Her weight is stable.  Hard collar embolism..  She is still taking Lasix but she feels like she may be becoming dehydrated.  She states that she is constantly having to drink water due to dry mouth and thirst.  She is not fluid overloaded on exam.  There is no pitting edema and her lungs are clear.  Her concern is her chronic low back pain.  She states that she is having to take 2-3 hydrocodone a day to manage her pain.  Previously I was giving her 30 hydrocodone a month however this is no longer helpful.  Before, she was able to supplement with NSAIDs however now he can no longer use NSAIDs due to Eliquis for her atrial fibrillation and Tylenol is not effective.  Past Medical History:  Diagnosis Date   Anal carcinoma ([place])    Chemo and radiation   Anemia    Anxiety    Dr. 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     Carotid artery disease (HCC)    DDD (degenerative disc disease), lumbosacral    GERD (gastroesophageal reflux disease)    Headache(784.0)    History of adenomatous polyp of colon 10/07/2015   Tubular adenoma high grade dysplasia   History of herpes genitalis    History of kidney stones    History of suicide attempt 2010   HTN (hypertension)    Hyperlipidemia    IBS (irritable bowel syndrome)    Major depression    OA (osteoarthritis)    Ovarian cancer ([place])    Stage IIIB papillary ovarian carcinoma  s/p omentectomy and BSO & chemotherapy (completed 12-07-2014)   Prolapse of vaginal vault after hysterectomy 07/20/2015    Rectovaginal fistula 08/05/2015   Seasonal allergies    Sigmoid diverticulosis    Smokers' cough [facility])     Past Surgical History:  Procedure Laterality Date   BIOPSY  10/12/2020   Procedure: BIOPSY;  Surgeon: Harvel Quale, MD;  [facility];  Service: Gastroenterology;;   BREAST ENHANCEMENT [place]   COLONOSCOPY N/A 10/07/2015   Procedure: COLONOSCOPY;  Surgeon: Rogene Houston, MD;  [facility];  Service: Endoscopy;  Laterality: N/A;  7:30   COLONOSCOPY WITH PROPOFOL N/A 10/12/2020   Procedure: COLONOSCOPY WITH PROPOFOL;  Surgeon: Harvel Quale, MD;  [facility];  Service: Gastroenterology;  Laterality: N/A;  9:00   ESOPHAGOGASTRODUODENOSCOPY (EGD) WITH PROPOFOL N/A 10/12/2020   Procedure: ESOPHAGOGASTRODUODENOSCOPY (EGD) WITH PROPOFOL;  Surgeon: Harvel Quale, MD;  [facility];  Service: Gastroenterology;  Laterality: N/A;   EXPLORATORY LAPAROTOMY/ OMENTECTOMY/  BILATERAL SALPINGOOPHORECTOMY/  PORT-A-CATH PLACEMENT  07-03-2014   [place]   KNEE ARTHROSCOPY Left 09-22-2004   LAPAROSCOPY N/A 06/02/2014   Procedure: DIAGNOSTIC LAPAROSCOPY, OMENTAL BIOPSY, RIGHT OVARY BIOPSY, LYSIS OF ADHESIONS;  Surgeon: Fanny Skates, MD;  Location: [place];  Service: General;  Laterality: N/A;   MUCOSAL ADVANCEMENT FLAP N/A 06/15/2016   Procedure: EXCISION RECTOVAGINAOL  FISTULA WITH MUCOSAL ADVANCEMENT FLAP;  Surgeon: Leighton Ruff, MD;  Location: [place];  Service: General;  Laterality: N/A;   PLACEMENT OF SETON N/A 09/09/2015   Procedure: PLACEMENT OF SETON;  Surgeon: Leighton Ruff, MD;  Location: [facility];  Service: General;  Laterality: N/A;   PORT-A-CATH REMOVAL Right 08/17/2014   Procedure: REMOVAL INTRAPERITONEAL CHEMO PORT;  Surgeon: Fanny Skates, MD;  Location: [place];  Service: General;  Laterality: Right;   PORTACATH PLACEMENT  Right 08/17/2014   Procedure:  PLACE NEW PORT A CATH;  Surgeon: Fanny Skates, MD;  Location: [place];  Service: General;  Laterality: Right;   RECTAL BIOPSY N/A 09/09/2015   Procedure: BIOPSY OF RECTOVAGINAL MASS;  Surgeon: Leighton Ruff, MD;  Location: [place];  Service: General;  Laterality: N/A;   [place]   fibroids   Current Outpatient Medications on File Prior to Visit  Medication Sig Dispense Refill   amiodarone (PACERONE) 200 MG tablet Take 1 tablet (200 mg total) by mouth See admin instructions. Amiodarone 1 Tablet 200 mg Twice Daily x 7 days, then 1 tab (200 mg) Daily after that 45 tablet 1   apixaban (ELIQUIS) 5 MG TABS tablet Take 1 tablet (5 mg total) by mouth 2 (two) times daily. 60 tablet 5   b complex vitamins tablet Take 1 tablet by mouth daily.     Cholecalciferol (VITAMIN D-3) 125 MCG (5000 UT) TABS Take 5,000 Units by mouth daily.     Coenzyme Q10 (CO Q 10) 100 MG CAPS Take 100 mg by mouth daily.     divalproex (DEPAKOTE ER) 500 MG 24 hr tablet Take 1 tablet (500 mg total) by mouth daily. 90 tablet 3   escitalopram (LEXAPRO) 20 MG tablet Take 1 tablet (20 mg total) by mouth 2 (two) times daily. 60 tablet 2   furosemide (LASIX) 40 MG tablet TAKE 1 TABLET BY MOUTH EVERY DAY 90 tablet 1   KRILL OIL PO Take 350 mg by mouth daily.     LORazepam (ATIVAN) 0.5 MG tablet Take 1 tablet (0.5 mg total) by mouth 3 (three) times daily as needed for anxiety. 90 tablet 2   Multiple Vitamin (MULTIVITAMIN) tablet Take 1 tablet by mouth daily.     Multiple Vitamins-Minerals (HAIR SKIN & NAILS ADVANCED PO) Take by mouth.     polyethylene glycol (MIRALAX / GLYCOLAX) 17 g packet Take 17 g by mouth daily. (Patient taking differently: Take 17 g by mouth daily as needed for moderate constipation.) 30 each 1   rosuvastatin (CRESTOR) 20 MG tablet Take 1 tablet (20 mg total) by mouth daily. 90 tablet 3   nicotine  (NICODERM CQ - DOSED IN MG/24 HOURS) 14 mg/24hr patch Place 1 patch (14 mg total) onto the skin daily. (Patient not taking: Reported on 09/01/2021) 28 patch 0   omeprazole (PRILOSEC) 40 MG capsule Take 1 capsule (40 mg total) by mouth daily. (Patient not taking: Reported on 09/01/2021) 90 capsule 3   ondansetron (ZOFRAN) 4 MG tablet TAKE 1 TABLET BY MOUTH EVERY 8 HOURS AS NEEDED FOR NAUSEA AND VOMITING (Patient not taking: Reported on 08/01/2021) 30 tablet 1   No current facility-administered medications on file prior to visit.   Allergies  Allergen Reactions   Morphine And Related     Delirium with sbo   Social History   Socioeconomic History   Marital status: Married  Spouse name: Coralyn Mark   Number of children: 3   Years of education: 12   Highest education level: Not on file  Occupational History   Occupation: retire    Comment: [employer]  Tobacco Use   Smoking status: Former    Packs/day: 0.50    Years: 43.00    Pack years: 21.50    Types: Cigarettes    Quit date: 06/25/2021    Years since quitting: 0.1   Smokeless tobacco: Never  Vaping Use   Vaping Use: Never used  Substance and Sexual Activity   Alcohol use: No   Drug use: No   Sexual activity: Not Currently    Birth control/protection: Surgical    Comment: hyst  Other Topics Concern   Not on file  Social History Narrative   Lives at home with Coralyn Mark   Retired [employer]   Social Determinants of Health   Financial Resource Strain: Low Risk    Difficulty of Paying Living Expenses: Not very hard  Food Insecurity: Not on file  Transportation Needs: Not on file  Physical Activity: Not on file  Stress: Not on file  Social Connections: Not on file  Intimate Partner Violence: Not on file   Family History  Problem Relation Age of Onset   Bipolar disorder Mother    Anxiety disorder Mother    Dementia Mother    Depression Mother    Mental illness Mother    Vision loss Mother    Alzheimer's disease Mother     Alcohol abuse Paternal Uncle    Bipolar disorder Maternal Grandmother    Dementia Maternal Grandmother    Alzheimer's disease Maternal Grandmother    Alcohol abuse Paternal Uncle    Stroke Brother    Deep vein thrombosis Son    Heart disease Father    Hyperlipidemia Father    Hypertension Father    Stroke Father    Vision loss Father    Atrial fibrillation Sister    Heart disease Brother    Heart disease Brother    ADD / ADHD Neg Hx    Drug abuse Neg Hx    OCD Neg Hx    Paranoid behavior Neg Hx    Schizophrenia Neg Hx    Seizures Neg Hx    Sexual abuse Neg Hx    Physical abuse Neg Hx       Review of Systems     Objective:   Physical Exam Vitals reviewed.  Constitutional:      General: She is not in acute distress.    Appearance: She is not ill-appearing or diaphoretic.  Cardiovascular:     Rate and Rhythm: Normal rate and regular rhythm.     Heart sounds: Normal heart sounds. No murmur heard.   No friction rub. No gallop.  Pulmonary:     Effort: Pulmonary effort is normal. No respiratory distress.     Breath sounds: Normal breath sounds. No stridor. No wheezing or rales.  Abdominal:     General: Abdomen is flat. Bowel sounds are normal. There is no distension.     Palpations: Abdomen is soft. There is no mass.     Tenderness: There is no abdominal tenderness.  Musculoskeletal:     Lumbar back: Tenderness and bony tenderness present. Decreased range of motion.     Right upper leg: No deformity.     Left upper leg: No deformity.     Right lower leg: No edema.     Left lower  leg: No edema.  Neurological:     Mental Status: She is alert.        Assessment & Plan:  Atrial fibrillation with RVR (HCC)  Grade II diastolic dysfunction  Chronic fatigue  Chronic midline low back pain with bilateral sciatica On a positive note, the patient's frailty and fatigue have improved dramatically now that he is in normal sinus rhythm heart rate is controlled.  She is  euvolemic.  Can discontinue Lasix.  However her back pain is more problematic.  She reported bilateral posterior hip pain which I believe is due to sciatica.  She has seen orthopedics and has been told there is nothing further they can do having tried and failed steroid injections.  Therefore I will increase hydrocodone/acetaminophen 5/325 to 1 tablet every 8 hours/90/month.  I have asked her to use less if possible.

## 2021-09-13 ENCOUNTER — Encounter: Payer: Self-pay | Admitting: Family Medicine

## 2021-09-13 ENCOUNTER — Other Ambulatory Visit: Payer: Self-pay | Admitting: Family Medicine

## 2021-09-13 MED ORDER — AMIODARONE HCL 200 MG PO TABS: 200.0000 mg | ORAL_TABLET | Freq: Every day | ORAL | 3 refills | Status: DC

## 2021-09-15 ENCOUNTER — Other Ambulatory Visit (HOSPITAL_COMMUNITY): Payer: Self-pay | Admitting: Psychiatry

## 2021-09-23 ENCOUNTER — Other Ambulatory Visit (HOSPITAL_COMMUNITY): Payer: Self-pay | Admitting: Psychiatry

## 2021-09-27 ENCOUNTER — Encounter: Payer: Self-pay | Admitting: Family Medicine

## 2021-10-03 NOTE — Progress Notes (Signed)
Chronic Care Management Pharmacy Note  10/13/2021 Name:  Abigail Wagner MRN:  [id number] DOB:  April 27, 1951  Summary: PharmD follow up.  Patient seems to be doing well with the exception of her current urinary issue.  She is holding Eliquis as directed.  Has visit today for another urinalysis.   Subjective: Abigail Wagner is an 71 y.o. year old female who is a primary patient of Pickard, Cammie Mcgee, MD.  The CCM team was consulted for assistance with disease management and care coordination needs.    Engaged with patient face to face for initial visit in response to provider referral for pharmacy case management and/or care coordination services.   Consent to Services:  The patient was given the following information about Chronic Care Management services today, agreed to services, and gave verbal consent: 1. CCM service includes personalized support from designated clinical staff supervised by the primary care provider, including individualized plan of care and coordination with other care providers 2. 24/7 contact phone numbers for assistance for urgent and routine care needs. 3. Service will only be billed when office clinical staff spend 20 minutes or more in a month to coordinate care. 4. Only one practitioner may furnish and bill the service in a calendar month. 5.The patient may stop CCM services at any time (effective at the end of the month) by phone call to the office staff. 6. The patient will be responsible for cost sharing (co-pay) of up to 20% of the service fee (after annual deductible is met). Patient agreed to services and consent obtained.  Patient Care Team: Susy Frizzle, MD as PCP - General (Family Medicine) Rogene Houston, MD as Consulting Physician (Gastroenterology) [name], MD as Consulting Physician (Regino Ramirez) Leighton Ruff, MD as Consulting Physician (General Surgery) Sheldon Silvan Scharlene Corn (Inactive) as Physician Assistant (Oncology) Marybelle Killings,  MD as Consulting Physician (Orthopedic Surgery) Edythe Clarity, [facility] as Pharmacist (Pharmacist)  Recent office visits:  None in the last six months.   Recent consult visits:  02/07/21 Behavioral Health (Video Visit) For major depressive disorder. No information available. 12/21/20 Oncology Derek Jack, MD. For cancer. No medication changes. 10/06/20 Gilda Crease, MD. For nausea and vomiting. STARTED Ondansetron 4 mg every 8 hours PRN. STOPPED Albuterol, Asprin, Benzonatate.   Hospital visits:  10/12/20 [facility] (3 Hours) Montez Morita, Quillian Quince, MD. For colonoscopy.   Objective:  Lab Results  Component Value Date   CREATININE 0.68 10/06/2021   BUN 21 10/06/2021   GFRNONAA >60 07/25/2021   GFRAA 91 03/07/2021   NA 140 10/06/2021   K 4.6 10/06/2021   CALCIUM 9.6 10/06/2021   CO2 28 10/06/2021   GLUCOSE 76 10/06/2021    No results found for: HGBA1C, FRUCTOSAMINE, GFR, MICROALBUR  Last diabetic Eye exam: No results found for: HMDIABEYEEXA  Last diabetic Foot exam: No results found for: HMDIABFOOTEX   Lab Results  Component Value Date   CHOL 140 06/29/2021   HDL 51 06/29/2021   LDLCALC 74 06/29/2021   TRIG 76 06/29/2021   CHOLHDL 2.7 06/29/2021    Hepatic Function Latest Ref Rng & Units 10/06/2021 07/25/2021 06/29/2021  Total Protein 6.1 - 8.1 g/dL 6.7 7.2 5.4(L)  Albumin 3.5 - 5.0 g/dL - 4.1 3.1(L)  AST 10 - 35 U/L $Remo'19 24 23  'LqUnS$ ALT 6 - 29 U/L $Remo'9 16 11  'eAfTJ$ Alk Phosphatase 38 - 126 U/L - 52 37(L)  Total Bilirubin 0.2 - 1.2 mg/dL 0.4  0.5 0.5  Bilirubin, Direct 0.0 - 0.2 mg/dL - - -     CBC Latest Ref Rng & Units 10/06/2021 07/25/2021 06/30/2021  WBC 3.8 - 10.8 Thousand/uL 5.5 4.2 3.2(L)  Hemoglobin 11.7 - 15.5 g/dL 12.2 12.0 10.5(L)  Hematocrit 35.0 - 45.0 % 35.9 36.1 33.0(L)  Platelets 140 - 400 Thousand/uL 263 253 147(L)    Lab Results  Component Value Date/Time   VD25OH 47.0 01/10/2017 03:02 PM    Clinical ASCVD: No  The  10-year ASCVD risk score (Arnett DK, et al., 2019) is: 14.3%   Values used to calculate the score:     Age: 52 years     Sex: Female     Is Non-Hispanic African American: No     Diabetic: No     Tobacco smoker: Yes     Systolic Blood Pressure: 725 mmHg     Is BP treated: Yes     HDL Cholesterol: 51 mg/dL     Total Cholesterol: 140 mg/dL    Depression screen [facility] 2/9 07/26/2021 05/27/2021 05/09/2021  Decreased Interest 0 0 1  Down, Depressed, Hopeless 1 1 2   PHQ - 2 Score 1 1 3   Altered sleeping 0 0 2  Tired, decreased energy 1 1 2   Change in appetite 1 1 0  Feeling bad or failure about yourself  0 0 2  Trouble concentrating 0 0 0  Moving slowly or fidgety/restless 0 0 0  Suicidal thoughts 0 0 1  PHQ-9 Score 3 3 10   Difficult doing work/chores Not difficult at all Somewhat difficult Not difficult at all  Some recent data might be hidden      Social History   Tobacco Use  Smoking Status Former   Packs/day: 0.50   Years: 43.00   Pack years: 21.50   Types: Cigarettes   Quit date: 06/25/2021   Years since quitting: 0.3  Smokeless Tobacco Never   BP Readings from Last 3 Encounters:  10/10/21 112/72  10/06/21 120/80  09/01/21 100/64   Pulse Readings from Last 3 Encounters:  10/10/21 78  10/06/21 90  09/01/21 82   Wt Readings from Last 3 Encounters:  10/10/21 115 lb (52.2 kg)  10/06/21 115 lb (52.2 kg)  09/01/21 112 lb (50.8 kg)   BMI Readings from Last 3 Encounters:  10/10/21 19.14 kg/m  10/06/21 19.14 kg/m  09/01/21 18.64 kg/m    Assessment/Interventions: Review of patient past medical history, allergies, medications, health status, including review of consultants reports, laboratory and other test data, was performed as part of comprehensive evaluation and provision of chronic care management services.   SDOH:  (Social Determinants of Health) assessments and interventions performed: Yes  Financial Resource Strain: Low Risk    Difficulty of Paying Living  Expenses: Not very hard    SDOH Screenings   Alcohol Screen: Not on file  Depression (PHQ2-9): Low Risk    PHQ-2 Score: 3  Financial Resource Strain: Low Risk    Difficulty of Paying Living Expenses: Not very hard  Food Insecurity: Not on file  Housing: Not on file  Physical Activity: Not on file  Social Connections: Not on file  Stress: Not on file  Tobacco Use: Medium Risk   Smoking Tobacco Use: Former   Smokeless Tobacco Use: Never   Passive Exposure: Not on file  Transportation Needs: Not on file    [place]  Allergies  Allergen Reactions   Morphine And Related     Delirium with sbo  Medications Reviewed Today     Reviewed by Erroll Luna, [facility] (Pharmacist) on 10/13/21 at 1110  Med List Status: <None>   Medication Order Taking? Sig Documenting Provider Last Dose Status Informant  amiodarone (PACERONE) 200 MG tablet [id number] Yes Take 1 tablet (200 mg total) by mouth daily. Donita Brooks, MD Taking Active   apixaban (ELIQUIS) 5 MG TABS tablet [id number] Yes Take 1 tablet (5 mg total) by mouth 2 (two) times daily. Shon Hale, MD Taking Active   b complex vitamins tablet [id number] Yes Take 1 tablet by mouth daily. [provider] Taking Active Spouse/Significant Other  Cholecalciferol (VITAMIN D-3) 125 MCG (5000 UT) TABS [id number] Yes Take 5,000 Units by mouth daily. [provider] Taking Active Spouse/Significant Other  Coenzyme Q10 (CO Q 10) 100 MG CAPS [id number] Yes Take 100 mg by mouth daily. [provider] Taking Active Spouse/Significant Other  divalproex (DEPAKOTE ER) 500 MG 24 hr tablet [id number] Yes Take 1 tablet (500 mg total) by mouth daily. Myrlene Broker, MD Taking Active   escitalopram (LEXAPRO) 20 MG tablet [id number] Yes Take 1 tablet (20 mg total) by mouth 2 (two) times daily.  Patient taking differently: Take 20 mg by mouth daily.   Myrlene Broker, MD Taking Active   furosemide (LASIX) 40 MG tablet  [id number] Yes TAKE 1 TABLET BY MOUTH EVERY DAY Donita Brooks, MD Taking Active   HYDROcodone-acetaminophen [facility]) 5-325 MG tablet [id number] Yes Take 1 tablet by mouth 3 (three) times daily as needed for moderate pain. Donita Brooks, MD Taking Active   KRILL OIL PO [id number] Yes Take 350 mg by mouth daily. [provider] Taking Active Spouse/Significant Other  LORazepam (ATIVAN) 0.5 MG tablet [id number] Yes TAKE 1 TABLET (0.5 MG TOTAL) BY MOUTH 3 (THREE) TIMES DAILY AS NEEDED FOR ANXIETY. Myrlene Broker, MD Taking Active   Multiple Vitamin (MULTIVITAMIN) tablet [id number] Yes Take 1 tablet by mouth daily. [provider] Taking Active Spouse/Significant Other  Multiple Vitamins-Minerals (HAIR SKIN & NAILS ADVANCED PO) [id number] Yes Take by mouth. [provider] Taking Active Spouse/Significant Other  nicotine (NICODERM CQ - DOSED IN MG/24 HOURS) 14 mg/24hr patch [id number] Yes Place 1 patch (14 mg total) onto the skin daily. Shon Hale, MD Taking Active   omeprazole (PRILOSEC) 40 MG capsule [id number] Yes Take 1 capsule (40 mg total) by mouth daily. Donita Brooks, MD Taking Active Spouse/Significant Other  ondansetron (ZOFRAN) 4 MG tablet [id number] Yes TAKE 1 TABLET BY MOUTH EVERY 8 HOURS AS NEEDED FOR NAUSEA AND VOMITING Donita Brooks, MD Taking Active Spouse/Significant Other  polyethylene glycol (MIRALAX / GLYCOLAX) 17 g packet [id number] Yes Take 17 g by mouth daily.  Patient taking differently: Take 17 g by mouth daily as needed for moderate constipation.   Vassie Loll, MD Taking Active Self           Med Note Larose Hires M   Wed Oct 06, 2020  3:09 PM) Patient and Husband report that she takes as needed.  rosuvastatin (CRESTOR) 20 MG tablet [id number] Yes Take 1 tablet (20 mg total) by mouth daily. Shon Hale, MD Taking Active   Med List Note Rolene Course 05/25/20 1635): Patient received Moderna vaccination 12/2019 at [facility] per the spouse-Unable to confirm the exact date or Lot numbers at this time            Patient Active Problem List   Diagnosis Date Noted  Atrial fibrillation with RVR ([place]) 06/28/2021   Recurrent Diverticulitis 06/28/2021   Poor appetite 06/28/2021   AKI (acute kidney injury) ([place]) 06/28/2021   Chronic diarrhea 10/06/2020   Chronic prescription opiate use 10/06/2020   SBO (small bowel obstruction) ([place]) 05/25/2020   Vulvar irritation 03/10/2020   Internal carotid artery stenosis, left    Neural foraminal stenosis of lumbar spine 05/16/2018   Genetic testing 05/24/2017   Vaginal infection 09/27/2016   Essential hypertension 09/27/2016   GERD (gastroesophageal reflux disease) 09/27/2016   Back pain 09/27/2016   Anxiety 09/27/2016   Allergy 09/27/2016   Radiation proctitis 12/23/2015   Abnormal weight loss 12/15/2015   Anal cancer ([place]) 11/19/2015   Dehydration 11/10/2015   History of ovarian cancer 08/03/2015   Prolapse of vaginal vault after hysterectomy 07/20/2015   Port-A-Cath in place 01/13/2015   Nausea with vomiting 10/23/2014   Epithelial ovarian cancer, FIGO stage IIIA ([place]) 07/16/2014   Abdominal carcinomatosis ([place]) 06/18/2014   Bipolar 1 disorder ([place]) 12/27/2011   Kidney stones 12/27/2011   Depression 11/07/2011    Immunization History  Administered Date(s) Administered   Fluad Quad(high Dose 65+) 08/16/2020   Influenza, High Dose Seasonal PF 07/17/2017, 07/09/2018, 06/16/2019   Influenza-Unspecified 10/04/2016   Moderna Sars-Covid-2 Vaccination 12/25/2019, 01/24/2020, 08/16/2020   Pneumococcal Conjugate-13 11/07/2016   Pneumococcal Polysaccharide-23 05/14/2018   Tdap 09/25/2008    Conditions to be addressed/monitored:  HTN, Migraines, GERD, Depression/Bipolar, HLD  Care Plan : General Pharmacy (Adult)  Updates made by Edythe Clarity, RPH since 10/13/2021 12:00 AM     Problem: HTN, Migraines, GERD, Depression/Bipolar, HLD    Priority: High  Onset Date: 03/07/2021     Long-Range Goal: Patient-Specific Goal   Start Date: 03/07/2021  Expected End Date: 09/07/2021  Recent Progress: On track  Priority: High  Note:   Current Barriers:  Acute hematuria  Pharmacist Clinical Goal(s):  Patient will achieve adherence to monitoring guidelines and medication adherence to achieve therapeutic efficacy adhere to prescribed medication regimen as evidenced by fill dates contact provider office for questions/concerns as evidenced notation of same in electronic health record through collaboration with PharmD and provider.   Interventions: 1:1 collaboration with Susy Frizzle, MD regarding development and update of comprehensive plan of care as evidenced by provider attestation and co-signature Inter-disciplinary care team collaboration (see longitudinal plan of care) Comprehensive medication review performed; medication list updated in electronic medical record  Hypertension (BP goal <130/00) -Controlled -Current treatment: None currently -Medications previously tried: none noted  -Current home readings: no logs today -Current exercise habits: minimal due to pain -Reports hypotensive/hypertensive symptoms -Educated on BP goals and benefits of medications for prevention of heart attack, stroke and kidney damage; Importance of home blood pressure monitoring; Symptoms of hypotension and importance of maintaining adequate hydration; -Counseled to monitor BP at home a few times per week, document, and provide log at future appointments -Recommended to continue current medication -BP may be getting too low causing dizziness, asked patient to record and will f/u  Update 10/13/21 BP controlled or on the lower end last few times in the office. She denies any dizziness at home. Has stopped her losartan. No changes needed at this time patient continue current management.  Hyperlipidemia: (LDL goal <  100) -Controlled -Current treatment: Rosuvastatin 20mg  daily Appropriate, Effective, Safe, Accessible -Medications previously tried: none noted  -Current dietary patterns: patient has not been eating as much as of late, however just started on Mirtazapine and this seems to be improving  her appetite -Current exercise habits: minimal exercise -Educated on Cholesterol goals;  Benefits of statin for ASCVD risk reduction; Importance of limiting foods high in cholesterol; -Recommended to continue current medication  Update 10/13/21 Most recent lipid panel shows excellent cholesterol. She continues to be adherence with no complains of myalgias. No changes needed at this time, reinforced importance of adherence. Continue statin at current dose.  Depression/Anxiety (Goal: Minimize symptoms) -Controlled -Current treatment: Escitalopram 20mg  daily Lorazepam 1mg  tid prn Depakote ER 500mg  daily -Medications previously tried/failed: none noted -PHQ9:  Flowsheet Row Clinical Support from 01/10/2021 in [place]  PHQ-9 Total Score 11     -GAD7:  GAD 7 : Generalized Anxiety Score 01/10/2021  Nervous, Anxious, on Edge 2  Control/stop worrying 1  Worry too much - different things 2  Trouble relaxing 1  Restless 1  Easily annoyed or irritable 1  Afraid - awful might happen 0  Total GAD 7 Score 8  Anxiety Difficulty Very difficult  -She reports her mood has been controlled and seems to be leveling off -Recommended to continue current medication  GERD (Goal: Control symptoms) -Controlled -Current treatment  Omeprazole 40mg  daily -Medications previously tried: none noted -Denies any symptoms, working on trigger foods -Reports accurate medication administration 30 minutes before food or drink on empty stomach  -Recommended to continue current medication  Patient Goals/Self-Care Activities Patient will:  - take medications as prescribed focus on medication adherence by  fill dates check blood pressure a few times per week, document, and provide at future appointments  Follow Up Plan: The care management team will reach out to the patient again over the next 120 days.           Medication Assistance: None required.  Patient affirms current coverage meets needs.  Compliance/Adherence/Medication fill history: Care Gaps: Zoster vaccines  Star-Rating Drugs: Losartan 50mg  - d/c today by PCP  Patient's preferred pharmacy is:  [id number] - [place] [place] [address] Phone: [phone] Fax: [phone]   Uses pill box? Yes Pt endorses 100% compliance  We discussed: Benefits of medication synchronization, packaging and delivery as well as enhanced pharmacist oversight with [employer]. Patient decided to: Continue current medication management strategy  Care Plan and Follow Up Patient Decision:  Patient agrees to Care Plan and Follow-up.  Plan: The care management team will reach out to the patient again over the next 20 days.  [name], PharmD Clinical Pharmacist [place] [phone]

## 2021-10-06 ENCOUNTER — Ambulatory Visit (INDEPENDENT_AMBULATORY_CARE_PROVIDER_SITE_OTHER): Payer: Medicare Other | Admitting: Nurse Practitioner

## 2021-10-06 ENCOUNTER — Telehealth: Payer: Self-pay

## 2021-10-06 ENCOUNTER — Other Ambulatory Visit: Payer: Self-pay

## 2021-10-06 ENCOUNTER — Encounter: Payer: Self-pay | Admitting: Nurse Practitioner

## 2021-10-06 VITALS — BP 120/80 | HR 90 | Ht 65.0 in | Wt 115.0 lb

## 2021-10-06 DIAGNOSIS — R319 Hematuria, unspecified: Secondary | ICD-10-CM

## 2021-10-06 LAB — COMPLETE METABOLIC PANEL WITH GFR
AG Ratio: 1.9 (calc) (ref 1.0–2.5)
ALT: 9 U/L (ref 6–29)
AST: 19 U/L (ref 10–35)
Albumin: 4.4 g/dL (ref 3.6–5.1)
Alkaline phosphatase (APISO): 55 U/L (ref 37–153)
BUN: 21 mg/dL (ref 7–25)
CO2: 28 mmol/L (ref 20–32)
Calcium: 9.6 mg/dL (ref 8.6–10.4)
Chloride: 104 mmol/L (ref 98–110)
Creat: 0.68 mg/dL (ref 0.60–1.00)
Globulin: 2.3 g/dL (calc) (ref 1.9–3.7)
Glucose, Bld: 76 mg/dL (ref 65–99)
Potassium: 4.6 mmol/L (ref 3.5–5.3)
Sodium: 140 mmol/L (ref 135–146)
Total Bilirubin: 0.4 mg/dL (ref 0.2–1.2)
Total Protein: 6.7 g/dL (ref 6.1–8.1)
eGFR: 94 mL/min/{1.73_m2} (ref >=60)

## 2021-10-06 LAB — CBC WITH DIFFERENTIAL/PLATELET
Absolute Monocytes: 501 cells/uL (ref 200–950)
Basophils Absolute: 39 cells/uL (ref 0–200)
Basophils Relative: 0.7 %
Eosinophils Absolute: 110 cells/uL (ref 15–500)
Eosinophils Relative: 2 %
HCT: 35.9 % (ref 35.0–45.0)
Hemoglobin: 12.2 g/dL (ref 11.7–15.5)
Lymphs Abs: 1584 cells/uL (ref 850–3900)
MCH: 33.2 pg — ABNORMAL HIGH (ref 27.0–33.0)
MCHC: 34 g/dL (ref 32.0–36.0)
MCV: 97.8 fL (ref 80.0–100.0)
MPV: 10.7 fL (ref 7.5–12.5)
Monocytes Relative: 9.1 %
Neutro Abs: 3267 cells/uL (ref 1500–7800)
Neutrophils Relative %: 59.4 %
Platelets: 263 10*3/uL (ref 140–400)
RBC: 3.67 10*6/uL — ABNORMAL LOW (ref 3.80–5.10)
RDW: 14.4 % (ref 11.0–15.0)
Total Lymphocyte: 28.8 %
WBC: 5.5 10*3/uL (ref 3.8–10.8)

## 2021-10-06 LAB — URINALYSIS, ROUTINE W REFLEX MICROSCOPIC

## 2021-10-06 MED ORDER — HYDROCODONE-ACETAMINOPHEN 5-325 MG PO TABS: 1.0000 | ORAL_TABLET | Freq: Three times a day (TID) | ORAL | 0 refills | Status: DC | PRN

## 2021-10-06 NOTE — Telephone Encounter (Signed)
Patient aware of results.

## 2021-10-06 NOTE — Telephone Encounter (Signed)
LOV 10/06/21 Last refill 09/01/21, #90, 0 refills  Please review, thanks!

## 2021-10-06 NOTE — Progress Notes (Signed)
Subjective:    Patient ID: Abigail Wagner, female    DOB: [birth year], 71 y.o.   MRN: [id number]  HPI: Abigail Wagner is a 71 y.o. female presenting with husband for blood in urine.  Chief Complaint  Patient presents with   Hematuria   HEMATURIA  Duration: days - since Tuesday morning Dysuria: no Urinary frequency: no Urgency: no Small volume voids: no Symptom severity: severe Urinary incontinence: no Foul odor: no Hematuria: yes Abdominal pain: no Back pain: no Suprapubic pain/pressure: yes Flank pain: no Fever:  no Nausea: no Vomiting: no Decreased appetite: no Relief with cranberry juice: not tried Previous urinary tract infection: yes Recurrent urinary tract infection: no Vaginal discharge: no Treatments attempted: increasing fluids  with water  Lower extremity swelling: no  Patient denies seeing blood counts and urine.  Does report a history of kidney stones.  She is not having any new back pain today.   Is currently taking Eliquis 5 mg twice daily for prevention of stroke related to atrial fibrillation.  She denies any easy bruising or bleeding.  Outpatient Encounter Medications as of 10/06/2021  Medication Sig Note   amiodarone (PACERONE) 200 MG tablet Take 1 tablet (200 mg total) by mouth daily.    apixaban (ELIQUIS) 5 MG TABS tablet Take 1 tablet (5 mg total) by mouth 2 (two) times daily.    b complex vitamins tablet Take 1 tablet by mouth daily.    Cholecalciferol (VITAMIN D-3) 125 MCG (5000 UT) TABS Take 5,000 Units by mouth daily.    Coenzyme Q10 (CO Q 10) 100 MG CAPS Take 100 mg by mouth daily.    divalproex (DEPAKOTE ER) 500 MG 24 hr tablet Take 1 tablet (500 mg total) by mouth daily.    escitalopram (LEXAPRO) 20 MG tablet Take 1 tablet (20 mg total) by mouth 2 (two) times daily.    furosemide (LASIX) 40 MG tablet TAKE 1 TABLET BY MOUTH EVERY DAY    KRILL OIL PO Take 350 mg by mouth daily.    LORazepam (ATIVAN) 0.5 MG tablet TAKE 1 TABLET (0.5 MG TOTAL)  BY MOUTH 3 (THREE) TIMES DAILY AS NEEDED FOR ANXIETY.    Multiple Vitamin (MULTIVITAMIN) tablet Take 1 tablet by mouth daily.    Multiple Vitamins-Minerals (HAIR SKIN & NAILS ADVANCED PO) Take by mouth.    nicotine (NICODERM CQ - DOSED IN MG/24 HOURS) 14 mg/24hr patch Place 1 patch (14 mg total) onto the skin daily. (Patient not taking: Reported on 09/01/2021)    omeprazole (PRILOSEC) 40 MG capsule Take 1 capsule (40 mg total) by mouth daily. (Patient not taking: Reported on 09/01/2021)    ondansetron (ZOFRAN) 4 MG tablet TAKE 1 TABLET BY MOUTH EVERY 8 HOURS AS NEEDED FOR NAUSEA AND VOMITING (Patient not taking: Reported on 08/01/2021)    polyethylene glycol (MIRALAX / GLYCOLAX) 17 g packet Take 17 g by mouth daily. (Patient taking differently: Take 17 g by mouth daily as needed for moderate constipation.) 10/06/2020: Patient and Husband report that she takes as needed.   rosuvastatin (CRESTOR) 20 MG tablet Take 1 tablet (20 mg total) by mouth daily.    [DISCONTINUED] HYDROcodone-acetaminophen (NORCO) 5-325 MG tablet Take 1 tablet by mouth 3 (three) times daily as needed for moderate pain.    No facility-administered encounter medications on file as of 10/06/2021.    Patient Active Problem List   Diagnosis Date Noted   Atrial fibrillation with RVR ([place]) 06/28/2021   Recurrent Diverticulitis 06/28/2021  Poor appetite 06/28/2021   AKI (acute kidney injury) ([place]) 06/28/2021   Chronic diarrhea 10/06/2020   Chronic prescription opiate use 10/06/2020   SBO (small bowel obstruction) ([place]) 05/25/2020   Vulvar irritation 03/10/2020   Internal carotid artery stenosis, left    Neural foraminal stenosis of lumbar spine 05/16/2018   Genetic testing 05/24/2017   Vaginal infection 09/27/2016   Essential hypertension 09/27/2016   GERD (gastroesophageal reflux disease) 09/27/2016   Back pain 09/27/2016   Anxiety 09/27/2016   Allergy 09/27/2016   Radiation proctitis 12/23/2015   Abnormal weight loss  12/15/2015   Anal cancer ([place]) 11/19/2015   Dehydration 11/10/2015   History of ovarian cancer 08/03/2015   Prolapse of vaginal vault after hysterectomy 07/20/2015   Port-A-Cath in place 01/13/2015   Nausea with vomiting 10/23/2014   Epithelial ovarian cancer, FIGO stage IIIA ([place]) 07/16/2014   Abdominal carcinomatosis ([place]) 06/18/2014   Bipolar 1 disorder ([place]) 12/27/2011   Kidney stones 12/27/2011   Depression 11/07/2011    Past Medical History:  Diagnosis Date   Anal carcinoma ([place])    Chemo and radiation   Anemia    Anxiety    Dr. Harrington Challenger at [place] (psychiatrist).     Carotid artery disease (HCC)    DDD (degenerative disc disease), lumbosacral    GERD (gastroesophageal reflux disease)    Headache(784.0)    History of adenomatous polyp of colon 10/07/2015   Tubular adenoma high grade dysplasia   History of herpes genitalis    History of kidney stones    History of suicide attempt 2010   HTN (hypertension)    Hyperlipidemia    IBS (irritable bowel syndrome)    Major depression    OA (osteoarthritis)    Ovarian cancer ([place])    Stage IIIB papillary ovarian carcinoma  s/p omentectomy and BSO & chemotherapy (completed 12-07-2014)   Prolapse of vaginal vault after hysterectomy 07/20/2015   Rectovaginal fistula 08/05/2015   Seasonal allergies    Sigmoid diverticulosis    Smokers' cough ([place])     Relevant past medical, surgical, family and social history reviewed and updated as indicated. Interim medical history since our last visit reviewed.  Review of Systems Per HPI unless specifically indicated above     Objective:    BP 120/80    Pulse 90    Ht _0  (1.651 m)    Wt 115 lb (52.2 kg)    SpO2 96%    BMI 19.14 kg/m   Wt Readings from Last 3 Encounters:  10/06/21 115 lb (52.2 kg)  09/01/21 112 lb (50.8 kg)  08/01/21 109 lb (49.4 kg)    Physical Exam Vitals and nursing note reviewed.  Constitutional:      General: She is not in acute distress.    Appearance:  Normal appearance. She is not toxic-appearing.  Eyes:     General: No scleral icterus.    Extraocular Movements: Extraocular movements intact.  Cardiovascular:     Rate and Rhythm: Normal rate and regular rhythm.     Heart sounds: Normal heart sounds. No murmur heard. Pulmonary:     Effort: Pulmonary effort is normal. No respiratory distress.     Breath sounds: Normal breath sounds. No wheezing, rhonchi or rales.  Abdominal:     General: Abdomen is flat. Bowel sounds are normal. There is no distension.     Palpations: Abdomen is soft. There is no mass.     Tenderness: There is no right CVA tenderness, left CVA tenderness  or rebound.       Comments: Mild suprapubic pressure upon palpation  Musculoskeletal:     Right lower leg: No edema.     Left lower leg: No edema.  Skin:    General: Skin is warm and dry.     Capillary Refill: Capillary refill takes less than 2 seconds.     Coloration: Skin is pale. Skin is not jaundiced.     Findings: No erythema.  Neurological:     Mental Status: She is alert and oriented to person, place, and time.     Motor: No weakness.     Gait: Gait normal.  Psychiatric:        Mood and Affect: Mood normal.        Behavior: Behavior normal.        Thought Content: Thought content normal.        Judgment: Judgment normal.    Results for orders placed or performed in visit on 10/06/21  Urinalysis, Routine w reflex microscopic  Result Value Ref Range   Color, Urine RED (A) YELLOW   APPearance CLOUDY (A) CLEAR   Specific Gravity, Urine CANCELED   CBC with Differential  Result Value Ref Range   WBC 5.5 3.8 - 10.8 Thousand/uL   RBC 3.67 (L) 3.80 - 5.10 Million/uL   Hemoglobin 12.2 11.7 - 15.5 g/dL   HCT 35.9 35.0 - 45.0 %   MCV 97.8 80.0 - 100.0 fL   MCH 33.2 (H) 27.0 - 33.0 pg   MCHC 34.0 32.0 - 36.0 g/dL   RDW 14.4 11.0 - 15.0 %   Platelets 263 140 - 400 Thousand/uL   MPV 10.7 7.5 - 12.5 fL   Neutro Abs 3,267 1,500 - 7,800 cells/uL   Lymphs  Abs 1,584 850 - 3,900 cells/uL   Absolute Monocytes 501 200 - 950 cells/uL   Eosinophils Absolute 110 15 - 500 cells/uL   Basophils Absolute 39 0 - 200 cells/uL   Neutrophils Relative % 59.4 %   Total Lymphocyte 28.8 %   Monocytes Relative 9.1 %   Eosinophils Relative 2.0 %   Basophils Relative 0.7 %  COMPLETE METABOLIC PANEL WITH GFR  Result Value Ref Range   Glucose, Bld 76 65 - 99 mg/dL   BUN 21 7 - 25 mg/dL   Creat 0.68 0.60 - 1.00 mg/dL   eGFR 94 > OR = 60 mL/min/1.44m   BUN/Creatinine Ratio NOT APPLICABLE 6 - 22 (calc)   Sodium 140 135 - 146 mmol/L   Potassium 4.6 3.5 - 5.3 mmol/L   Chloride 104 98 - 110 mmol/L   CO2 28 20 - 32 mmol/L   Calcium 9.6 8.6 - 10.4 mg/dL   Total Protein 6.7 6.1 - 8.1 g/dL   Albumin 4.4 3.6 - 5.1 g/dL   Globulin 2.3 1.9 - 3.7 g/dL (calc)   AG Ratio 1.9 1.0 - 2.5 (calc)   Total Bilirubin 0.4 0.2 - 1.2 mg/dL   Alkaline phosphatase (APISO) 55 37 - 153 U/L   AST 19 10 - 35 U/L   ALT 9 6 - 29 U/L      Assessment & Plan:  1. Hematuria, unspecified type Acute.  Suspect possible cystitis, however dipstick unable to be performed with reliability due to extensive blood in urine.  We will send urine for culture and treat as indicated for an acute urinary tract infection.  She is not having classical symptoms of cystitis including dysuria, frequency, urgency today.  There are no blood  clots in her urine today.  Given chronic anticoagulation use for atrial fibrillation, check stat CBC today to rule out acute hemorrhage.  We will also check stat kidney function with liver function and electrolytes to rule out metabolic process including nephritis.  We will also check a renal stone study to rule out an acute kidney stone.  I had a discussion with the patient about risks versus benefit of continuing oral anticoagulation over the next few days.  I suspect the oral anticoagulation is worsening the bleeding in her lower urinary tract.  We discussed that there is a  risk of stroke with stopping the blood thinner.  Together, we decided to hold the oral anticoagulation until Monday.  We will plan to recheck her urine at that time.  The risks of continuing oral anticoagulation outweigh the benefits of preventing stroke at this time.  If the patient's symptoms worsen over the weekend or if she starts developing any signs or symptoms of a stroke, I have advised she and her husband to seek emergent care.  Patient and her husband verbalized understanding and all questions answered.  - Urinalysis, Routine w reflex microscopic - Urine Culture - CBC with Differential - COMPLETE METABOLIC PANEL WITH GFR - CT RENAL STONE STUDY; Future     Follow up plan: Return in about 4 days (around 10/10/2021) for hematuria recheck.

## 2021-10-06 NOTE — Telephone Encounter (Signed)
-----   Message from Eulogio Bear, NP sent at 10/06/2021  2:50 PM EST ----- Please also notify electrolytes, kidney function, and liver function numbers are excellent.

## 2021-10-07 ENCOUNTER — Other Ambulatory Visit: Payer: Self-pay | Admitting: Family Medicine

## 2021-10-07 LAB — URINE CULTURE
MICRO NUMBER:: 12863100
SPECIMEN QUALITY:: ADEQUATE

## 2021-10-07 MED ORDER — HYDROCODONE-ACETAMINOPHEN 5-325 MG PO TABS: 1.0000 | ORAL_TABLET | Freq: Three times a day (TID) | ORAL | 0 refills | Status: DC | PRN

## 2021-10-07 NOTE — Telephone Encounter (Signed)
Hydrocodone refill request.  Last filled 10/06/2021 last seen 10/06/2021.

## 2021-10-10 ENCOUNTER — Other Ambulatory Visit: Payer: Self-pay

## 2021-10-10 ENCOUNTER — Ambulatory Visit (INDEPENDENT_AMBULATORY_CARE_PROVIDER_SITE_OTHER): Payer: Medicare Other | Admitting: Family Medicine

## 2021-10-10 ENCOUNTER — Encounter: Payer: Self-pay | Admitting: Family Medicine

## 2021-10-10 VITALS — BP 112/72 | HR 78 | Temp 97.8°F | Resp 18 | Ht 65.0 in | Wt 115.0 lb

## 2021-10-10 DIAGNOSIS — R319 Hematuria, unspecified: Secondary | ICD-10-CM | POA: Diagnosis not present

## 2021-10-10 LAB — MICROSCOPIC MESSAGE

## 2021-10-10 LAB — URINALYSIS, ROUTINE W REFLEX MICROSCOPIC
Bacteria, UA: NONE SEEN /HPF
Bilirubin Urine: NEGATIVE
Glucose, UA: NEGATIVE
Hyaline Cast: NONE SEEN /LPF
Ketones, ur: NEGATIVE
Nitrite: NEGATIVE
Specific Gravity, Urine: 1.015 (ref 1.001–1.035)
WBC, UA: NONE SEEN /HPF (ref 0–5)
pH: 7 (ref 5.0–8.0)

## 2021-10-10 NOTE — Progress Notes (Signed)
Subjective:    Patient ID: Abigail Wagner, female    DOB: 1951/05/31, 71 y.o.   MRN: [id number]  HPI  Patient was seen by my partner on Thursday with gross hematuria.  It is painless.  There was no dysuria.  There was no frequency or urgency.  We held her Eliquis.  She experienced continued gross hematuria Friday but last Saturday she saw no more hematuria.  However urinalysis today still shows +3 microscopic hematuria despite holding Eliquis.  She had a CT scan in September which showed no renal lesions or kidney stones or bladder lesions. Past Medical History:  Diagnosis Date   Anal carcinoma ([place])    Chemo and radiation   Anemia    Anxiety    Dr. Harrington Challenger at [place] (psychiatrist).     Carotid artery disease (HCC)    DDD (degenerative disc disease), lumbosacral    GERD (gastroesophageal reflux disease)    Headache(784.0)    History of adenomatous polyp of colon 10/07/2015   Tubular adenoma high grade dysplasia   History of herpes genitalis    History of kidney stones    History of suicide attempt 2010   HTN (hypertension)    Hyperlipidemia    IBS (irritable bowel syndrome)    Major depression    OA (osteoarthritis)    Ovarian cancer ([place])    Stage IIIB papillary ovarian carcinoma  s/p omentectomy and BSO & chemotherapy (completed 12-07-2014)   Prolapse of vaginal vault after hysterectomy 07/20/2015   Rectovaginal fistula 08/05/2015   Seasonal allergies    Sigmoid diverticulosis    Smokers' cough [facility])     Past Surgical History:  Procedure Laterality Date   BIOPSY  10/12/2020   Procedure: BIOPSY;  Surgeon: Harvel Quale, MD;  [facility];  Service: Gastroenterology;;   BREAST ENHANCEMENT [place]   COLONOSCOPY N/A 10/07/2015   Procedure: COLONOSCOPY;  Surgeon: Rogene Houston, MD;  [facility];  Service: Endoscopy;  Laterality: N/A;  7:30   COLONOSCOPY WITH PROPOFOL N/A 10/12/2020   Procedure: COLONOSCOPY WITH  PROPOFOL;  Surgeon: Harvel Quale, MD;  [facility];  Service: Gastroenterology;  Laterality: N/A;  9:00   ESOPHAGOGASTRODUODENOSCOPY (EGD) WITH PROPOFOL N/A 10/12/2020   Procedure: ESOPHAGOGASTRODUODENOSCOPY (EGD) WITH PROPOFOL;  Surgeon: Harvel Quale, MD;  [facility];  Service: Gastroenterology;  Laterality: N/A;   EXPLORATORY LAPAROTOMY/ OMENTECTOMY/  BILATERAL SALPINGOOPHORECTOMY/  PORT-A-CATH PLACEMENT  07-03-2014   [place]   KNEE ARTHROSCOPY Left 09-22-2004   LAPAROSCOPY N/A 06/02/2014   Procedure: DIAGNOSTIC LAPAROSCOPY, OMENTAL BIOPSY, RIGHT OVARY BIOPSY, LYSIS OF ADHESIONS;  Surgeon: Fanny Skates, MD;  Location: [facility];  Service: General;  Laterality: N/A;   MUCOSAL ADVANCEMENT FLAP N/A 06/15/2016   Procedure: EXCISION RECTOVAGINAOL FISTULA WITH MUCOSAL ADVANCEMENT FLAP;  Surgeon: Leighton Ruff, MD;  Location: [facility];  Service: General;  Laterality: N/A;   [place] N/A 09/09/2015   Procedure: PLACEMENT OF SETON;  Surgeon: Leighton Ruff, MD;  Location: [facility];  Service: General;  Laterality: N/A;   PORT-A-CATH REMOVAL Right 08/17/2014   Procedure: REMOVAL INTRAPERITONEAL CHEMO PORT;  Surgeon: Fanny Skates, MD;  Location: [facility];  Service: General;  Laterality: Right;   PORTACATH PLACEMENT Right 08/17/2014   Procedure:  PLACE NEW PORT A CATH;  Surgeon: Fanny Skates, MD;  Location: [facility];  Service: General;  Laterality: Right;  RECTAL BIOPSY N/A 09/09/2015   Procedure: BIOPSY OF RECTOVAGINAL MASS;  Surgeon: Leighton Ruff, MD;  Location: [facility] [facility];  Service: General;  Laterality: N/A;   [place]   fibroids   Current Outpatient Medications on File Prior to Visit  Medication Sig Dispense Refill   amiodarone (PACERONE) 200 MG tablet Take 1 tablet (200 mg total) by mouth daily. 30 tablet 3    b complex vitamins tablet Take 1 tablet by mouth daily.     Cholecalciferol (VITAMIN D-3) 125 MCG (5000 UT) TABS Take 5,000 Units by mouth daily.     Coenzyme Q10 (CO Q 10) 100 MG CAPS Take 100 mg by mouth daily.     divalproex (DEPAKOTE ER) 500 MG 24 hr tablet Take 1 tablet (500 mg total) by mouth daily. 90 tablet 3   escitalopram (LEXAPRO) 20 MG tablet Take 1 tablet (20 mg total) by mouth 2 (two) times daily. (Patient taking differently: Take 20 mg by mouth daily.) 60 tablet 2   furosemide (LASIX) 40 MG tablet TAKE 1 TABLET BY MOUTH EVERY DAY 90 tablet 1   HYDROcodone-acetaminophen (NORCO) 5-325 MG tablet Take 1 tablet by mouth 3 (three) times daily as needed for moderate pain. 90 tablet 0   KRILL OIL PO Take 350 mg by mouth daily.     LORazepam (ATIVAN) 0.5 MG tablet TAKE 1 TABLET (0.5 MG TOTAL) BY MOUTH 3 (THREE) TIMES DAILY AS NEEDED FOR ANXIETY. 90 tablet 2   Multiple Vitamin (MULTIVITAMIN) tablet Take 1 tablet by mouth daily.     Multiple Vitamins-Minerals (HAIR SKIN & NAILS ADVANCED PO) Take by mouth.     nicotine (NICODERM CQ - DOSED IN MG/24 HOURS) 14 mg/24hr patch Place 1 patch (14 mg total) onto the skin daily. 28 patch 0   omeprazole (PRILOSEC) 40 MG capsule Take 1 capsule (40 mg total) by mouth daily. 90 capsule 3   ondansetron (ZOFRAN) 4 MG tablet TAKE 1 TABLET BY MOUTH EVERY 8 HOURS AS NEEDED FOR NAUSEA AND VOMITING 30 tablet 1   polyethylene glycol (MIRALAX / GLYCOLAX) 17 g packet Take 17 g by mouth daily. (Patient taking differently: Take 17 g by mouth daily as needed for moderate constipation.) 30 each 1   rosuvastatin (CRESTOR) 20 MG tablet Take 1 tablet (20 mg total) by mouth daily. 90 tablet 3   apixaban (ELIQUIS) 5 MG TABS tablet Take 1 tablet (5 mg total) by mouth 2 (two) times daily. (Patient not taking: Reported on 10/10/2021) 60 tablet 5   No current facility-administered medications on file prior to visit.   Allergies  Allergen Reactions   Morphine And Related      Delirium with sbo   Social History   Socioeconomic History   Marital status: Married    Spouse name: Coralyn Mark   Number of children: 3   Years of education: 12   Highest education level: Not on file  Occupational History   Occupation: retire    Comment: [employer]  Tobacco Use   Smoking status: Former    Packs/day: 0.50    Years: 43.00    Pack years: 21.50    Types: Cigarettes    Quit date: 06/25/2021    Years since quitting: 0.2   Smokeless tobacco: Never  Vaping Use   Vaping Use: Never used  Substance and Sexual Activity   Alcohol use: No   Drug use: No   Sexual activity: Not Currently  Birth control/protection: Surgical    Comment: hyst  Other Topics Concern   Not on file  Social History Narrative   Lives at home with Coralyn Mark   Retired [place] Strain: Low Risk    Difficulty of Paying Living Expenses: Not very hard  Food Insecurity: Not on file  Transportation Needs: Not on file  Physical Activity: Not on file  Stress: Not on file  Social Connections: Not on file  Intimate Partner Violence: Not on file   Family History  Problem Relation Age of Onset   Bipolar disorder Mother    Anxiety disorder Mother    Dementia Mother    Depression Mother    Mental illness Mother    Vision loss Mother    Alzheimer's disease Mother    Alcohol abuse Paternal Uncle    Bipolar disorder Maternal Grandmother    Dementia Maternal Grandmother    Alzheimer's disease Maternal Grandmother    Alcohol abuse Paternal Uncle    Stroke Brother    Deep vein thrombosis Son    Heart disease Father    Hyperlipidemia Father    Hypertension Father    Stroke Father    Vision loss Father    Atrial fibrillation Sister    Heart disease Brother    Heart disease Brother    ADD / ADHD Neg Hx    Drug abuse Neg Hx    OCD Neg Hx    Paranoid behavior Neg Hx    Schizophrenia Neg Hx    Seizures Neg Hx    Sexual abuse Neg Hx    Physical  abuse Neg Hx       Review of Systems     Objective:   Physical Exam Vitals reviewed.  Constitutional:      General: She is not in acute distress.    Appearance: She is not ill-appearing or diaphoretic.  Cardiovascular:     Rate and Rhythm: Normal rate and regular rhythm.     Heart sounds: Normal heart sounds. No murmur heard.   No friction rub. No gallop.  Pulmonary:     Effort: Pulmonary effort is normal. No respiratory distress.     Breath sounds: Normal breath sounds. No stridor. No wheezing or rales.  Abdominal:     General: Abdomen is flat. Bowel sounds are normal. There is no distension.     Palpations: Abdomen is soft. There is no mass.     Tenderness: There is no abdominal tenderness.  Musculoskeletal:     Right upper leg: No deformity.     Left upper leg: No deformity.     Right lower leg: No edema.     Left lower leg: No edema.  Neurological:     Mental Status: She is alert.        Assessment & Plan:  Hematuria, unspecified type - Plan: Urinalysis, Routine w reflex microscopic I feel that repeating a CT scan just 3 months later will be low utility.  However I think that seeing a urologist for cystoscopy is reasonable and recommended given the persistence of the microscopic hematuria.  Patient would like to recheck her urine Thursday.  If the blood is still present, the plan will be to continue to hold Eliquis and see urology.  If the blood is absent, we may resume Eliquis but I would still encourage the patient to see urology for cystoscopy.  She would like to repeat urinalysis before making a  decision

## 2021-10-13 ENCOUNTER — Encounter: Payer: Self-pay | Admitting: Family Medicine

## 2021-10-13 ENCOUNTER — Ambulatory Visit (INDEPENDENT_AMBULATORY_CARE_PROVIDER_SITE_OTHER): Payer: Medicare Other | Admitting: Pharmacist

## 2021-10-13 ENCOUNTER — Other Ambulatory Visit: Payer: Self-pay

## 2021-10-13 ENCOUNTER — Ambulatory Visit (INDEPENDENT_AMBULATORY_CARE_PROVIDER_SITE_OTHER): Payer: Medicare Other | Admitting: Family Medicine

## 2021-10-13 VITALS — BP 122/72 | HR 78 | Temp 97.5°F | Resp 18 | Ht 65.0 in | Wt 117.0 lb

## 2021-10-13 DIAGNOSIS — I10 Essential (primary) hypertension: Secondary | ICD-10-CM

## 2021-10-13 DIAGNOSIS — R319 Hematuria, unspecified: Secondary | ICD-10-CM

## 2021-10-13 DIAGNOSIS — E785 Hyperlipidemia, unspecified: Secondary | ICD-10-CM

## 2021-10-13 LAB — URINALYSIS, ROUTINE W REFLEX MICROSCOPIC
Bacteria, UA: NONE SEEN /HPF
Bilirubin Urine: NEGATIVE
Glucose, UA: NEGATIVE
Hgb urine dipstick: NEGATIVE
Hyaline Cast: NONE SEEN /LPF
Ketones, ur: NEGATIVE
Leukocytes,Ua: NEGATIVE
Nitrite: NEGATIVE
RBC / HPF: NONE SEEN /HPF (ref 0–2)
Specific Gravity, Urine: 1.02 (ref 1.001–1.035)
pH: 6 (ref 5.0–8.0)

## 2021-10-13 LAB — MICROSCOPIC MESSAGE

## 2021-10-13 NOTE — Patient Instructions (Signed)
Visit Information   Goals Addressed   None    Patient Care Plan: General Pharmacy (Adult)     Problem Identified: HTN, Migraines, GERD, Depression/Bipolar, HLD   Priority: High  Onset Date: 03/07/2021     Long-Range Goal: Patient-Specific Goal   Start Date: 03/07/2021  Expected End Date: 09/07/2021  This Visit's Progress: On track  Priority: High  Note:   Current Barriers:  Unable to independently monitor therapeutic efficacy Unable to achieve control of dizziness/shaky spells   Pharmacist Clinical Goal(s):  Patient will achieve adherence to monitoring guidelines and medication adherence to achieve therapeutic efficacy achieve control of dizziness as evidenced by symptoms adhere to prescribed medication regimen as evidenced by fill dates contact provider office for questions/concerns as evidenced notation of same in electronic health record through collaboration with PharmD and provider.   Interventions: 1:1 collaboration with Susy Frizzle, MD regarding development and update of comprehensive plan of care as evidenced by provider attestation and co-signature Inter-disciplinary care team collaboration (see longitudinal plan of care) Comprehensive medication review performed; medication list updated in electronic medical record  Hypertension (BP goal <140/90) -Controlled -Current treatment: Losartan 50mg  daily -Medications previously tried: none noted  -Current home readings: no logs today -Current exercise habits: minimal due to pain -Reports hypotensive/hypertensive symptoms -Educated on BP goals and benefits of medications for prevention of heart attack, stroke and kidney damage; Importance of home blood pressure monitoring; Symptoms of hypotension and importance of maintaining adequate hydration; -Counseled to monitor BP at home a few times per week, document, and provide log at future appointments -Recommended to continue current medication -BP may be getting too  low causing dizziness, asked patient to record and will f/u  Hyperlipidemia: (LDL goal < 100) -Controlled -Current treatment: Rosuvastatin 20mg  daily -Medications previously tried: none noted  -Current dietary patterns: patient has not been eating as much as of late, however just started on Mirtazapine and this seems to be improving her appetite -Current exercise habits: minimal exercise -Educated on Cholesterol goals;  Benefits of statin for ASCVD risk reduction; Importance of limiting foods high in cholesterol; -Recommended to continue current medication  Depression/Anxiety (Goal: Minimize symptoms) -Controlled -Current treatment: Excitalopram 20mg  daily Lorazepam 1mg  tid prn Depakote ER 500mg  daily -Medications previously tried/failed: none noted -PHQ9:  Flowsheet Row Clinical Support from 01/10/2021 in [place]  PHQ-9 Total Score 11     -GAD7:  GAD 7 : Generalized Anxiety Score 01/10/2021  Nervous, Anxious, on Edge 2  Control/stop worrying 1  Worry too much - different things 2  Trouble relaxing 1  Restless 1  Easily annoyed or irritable 1  Afraid - awful might happen 0  Total GAD 7 Score 8  Anxiety Difficulty Very difficult  -She reports her mood has been controlled and seems to be leveling off -Recommended to continue current medication   GERD (Goal: Control symptoms) -Controlled -Current treatment  Omeprazole 40mg  daily -Medications previously tried: none noted -Denies any symptoms, working on trigger foods -Reports accurate medication administration 30 minutes before food or drink on empty stomach  -Recommended to continue current medication  Patient Goals/Self-Care Activities Patient will:  - take medications as prescribed focus on medication adherence by fill dates check blood pressure a few times per week, document, and provide at future appointments  Follow Up Plan: The care management team will reach out to the patient again over  the next 120 days.       Patient verbalizes understanding of instructions and care plan  provided today and agrees to view in [facility]. Active MyChart status confirmed with patient.   Telephone follow up appointment with pharmacy team member scheduled for: 6 months  Edythe Clarity, [place], PharmD, [place] Clinical Pharmacist Practitioner [place] [phone]

## 2021-10-13 NOTE — Progress Notes (Signed)
Subjective:    Patient ID: Abigail Wagner, female    DOB: [birth year], 71 y.o.   MRN: [id number]  HPI  10/10/21 Patient was seen by my partner on Thursday with gross hematuria.  It is painless.  There was no dysuria.  There was no frequency or urgency.  We held her Eliquis.  She experienced continued gross hematuria Friday but last Saturday she saw no more hematuria.  However urinalysis today still shows +3 microscopic hematuria despite holding Eliquis.  She had a CT scan in September which showed no renal lesions or kidney stones or bladder lesions.  At that time, my plan was:  I feel that repeating a CT scan just 3 months later will be low utility.  However I think that seeing a urologist for cystoscopy is reasonable and recommended given the persistence of the microscopic hematuria.  Patient would like to recheck her urine Thursday.  If the blood is still present, the plan will be to continue to hold Eliquis and see urology.  If the blood is absent, we may resume Eliquis but I would still encourage the patient to see urology for cystoscopy.  She would like to repeat urinalysis before making a decision  10/13/21 Patient has not seen any additional blood since I last saw her.  She denies any dysuria.  She denies any gross hematuria.  She denies any back pain fever chills.  She has not taken any of her Eliquis since her last visit.  Past Medical History:  Diagnosis Date   Anal carcinoma ([place])    Chemo and radiation   Anemia    Anxiety    Dr. Harrington Challenger at [facility] (psychiatrist).     Carotid artery disease (HCC)    DDD (degenerative disc disease), lumbosacral    GERD (gastroesophageal reflux disease)    Headache(784.0)    History of adenomatous polyp of colon 10/07/2015   Tubular adenoma high grade dysplasia   History of herpes genitalis    History of kidney stones    History of suicide attempt 2010   HTN (hypertension)    Hyperlipidemia    IBS (irritable bowel syndrome)    Major depression     OA (osteoarthritis)    Ovarian cancer ([place])    Stage IIIB papillary ovarian carcinoma  s/p omentectomy and BSO & chemotherapy (completed 12-07-2014)   Prolapse of vaginal vault after hysterectomy 07/20/2015   Rectovaginal fistula 08/05/2015   Seasonal allergies    Sigmoid diverticulosis    Smokers' cough [facility])     Past Surgical History:  Procedure Laterality Date   BIOPSY  10/12/2020   Procedure: BIOPSY;  Surgeon: Harvel Quale, MD;  [facility];  Service: Gastroenterology;;   BREAST ENHANCEMENT [place]   COLONOSCOPY N/A 10/07/2015   Procedure: COLONOSCOPY;  Surgeon: Rogene Houston, MD;  [facility];  Service: Endoscopy;  Laterality: N/A;  7:30   COLONOSCOPY WITH PROPOFOL N/A 10/12/2020   Procedure: COLONOSCOPY WITH PROPOFOL;  Surgeon: Harvel Quale, MD;  [facility];  Service: Gastroenterology;  Laterality: N/A;  9:00   ESOPHAGOGASTRODUODENOSCOPY (EGD) WITH PROPOFOL N/A 10/12/2020   Procedure: ESOPHAGOGASTRODUODENOSCOPY (EGD) WITH PROPOFOL;  Surgeon: Harvel Quale, MD;  [facility];  Service: Gastroenterology;  Laterality: N/A;   EXPLORATORY LAPAROTOMY/ OMENTECTOMY/  BILATERAL SALPINGOOPHORECTOMY/  PORT-A-CATH PLACEMENT  07-03-2014   [facility]   KNEE ARTHROSCOPY Left 09-22-2004   LAPAROSCOPY N/A 06/02/2014  Procedure: DIAGNOSTIC LAPAROSCOPY, OMENTAL BIOPSY, RIGHT OVARY BIOPSY, LYSIS OF ADHESIONS;  Surgeon: Fanny Skates, MD;  Location: [place];  Service: General;  Laterality: N/A;   MUCOSAL ADVANCEMENT FLAP N/A 06/15/2016   Procedure: EXCISION RECTOVAGINAOL FISTULA WITH MUCOSAL ADVANCEMENT FLAP;  Surgeon: Leighton Ruff, MD;  Location: [place];  Service: General;  Laterality: N/A;   [place] N/A 09/09/2015   Procedure: PLACEMENT OF SETON;  Surgeon: Leighton Ruff, MD;  Location: [facility];  Service: General;  Laterality: N/A;    PORT-A-CATH REMOVAL Right 08/17/2014   Procedure: REMOVAL INTRAPERITONEAL CHEMO PORT;  Surgeon: Fanny Skates, MD;  Location: [place];  Service: General;  Laterality: Right;   PORTACATH PLACEMENT Right 08/17/2014   Procedure:  PLACE NEW PORT A CATH;  Surgeon: Fanny Skates, MD;  Location: [place];  Service: General;  Laterality: Right;   RECTAL BIOPSY N/A 09/09/2015   Procedure: BIOPSY OF RECTOVAGINAL MASS;  Surgeon: Leighton Ruff, MD;  Location: [place];  Service: General;  Laterality: N/A;   [place]   fibroids   Current Outpatient Medications on File Prior to Visit  Medication Sig Dispense Refill   amiodarone (PACERONE) 200 MG tablet Take 1 tablet (200 mg total) by mouth daily. 30 tablet 3   apixaban (ELIQUIS) 5 MG TABS tablet Take 1 tablet (5 mg total) by mouth 2 (two) times daily. 60 tablet 5   b complex vitamins tablet Take 1 tablet by mouth daily.     Cholecalciferol (VITAMIN D-3) 125 MCG (5000 UT) TABS Take 5,000 Units by mouth daily.     Coenzyme Q10 (CO Q 10) 100 MG CAPS Take 100 mg by mouth daily.     divalproex (DEPAKOTE ER) 500 MG 24 hr tablet Take 1 tablet (500 mg total) by mouth daily. 90 tablet 3   escitalopram (LEXAPRO) 20 MG tablet Take 1 tablet (20 mg total) by mouth 2 (two) times daily. (Patient taking differently: Take 20 mg by mouth daily.) 60 tablet 2   furosemide (LASIX) 40 MG tablet TAKE 1 TABLET BY MOUTH EVERY DAY 90 tablet 1   HYDROcodone-acetaminophen (NORCO) 5-325 MG tablet Take 1 tablet by mouth 3 (three) times daily as needed for moderate pain. 90 tablet 0   KRILL OIL PO Take 350 mg by mouth daily.     LORazepam (ATIVAN) 0.5 MG tablet TAKE 1 TABLET (0.5 MG TOTAL) BY MOUTH 3 (THREE) TIMES DAILY AS NEEDED FOR ANXIETY. 90 tablet 2   Multiple Vitamin (MULTIVITAMIN) tablet Take 1 tablet by mouth daily.     Multiple Vitamins-Minerals (HAIR SKIN & NAILS ADVANCED PO)  Take by mouth.     nicotine (NICODERM CQ - DOSED IN MG/24 HOURS) 14 mg/24hr patch Place 1 patch (14 mg total) onto the skin daily. 28 patch 0   omeprazole (PRILOSEC) 40 MG capsule Take 1 capsule (40 mg total) by mouth daily. 90 capsule 3   ondansetron (ZOFRAN) 4 MG tablet TAKE 1 TABLET BY MOUTH EVERY 8 HOURS AS NEEDED FOR NAUSEA AND VOMITING 30 tablet 1   polyethylene glycol (MIRALAX / GLYCOLAX) 17 g packet Take 17 g by mouth daily. (Patient taking differently: Take 17 g by mouth daily as needed for moderate constipation.) 30 each 1   rosuvastatin (CRESTOR) 20 MG tablet Take 1 tablet (20 mg total) by mouth daily. 90 tablet 3   No current facility-administered medications on file prior to  visit.   Allergies  Allergen Reactions   Morphine And Related     Delirium with sbo   Social History   Socioeconomic History   Marital status: Married    Spouse name: Coralyn Mark   Number of children: 3   Years of education: 12   Highest education level: Not on file  Occupational History   Occupation: retire    Comment: [employer]  Tobacco Use   Smoking status: Former    Packs/day: 0.50    Years: 43.00    Pack years: 21.50    Types: Cigarettes    Quit date: 06/25/2021    Years since quitting: 0.3   Smokeless tobacco: Never  Vaping Use   Vaping Use: Never used  Substance and Sexual Activity   Alcohol use: No   Drug use: No   Sexual activity: Not Currently    Birth control/protection: Surgical    Comment: hyst  Other Topics Concern   Not on file  Social History Narrative   Lives at home with Coralyn Mark   Retired [employer]   Social Determinants of Health   Financial Resource Strain: Low Risk    Difficulty of Paying Living Expenses: Not very hard  Food Insecurity: Not on file  Transportation Needs: Not on file  Physical Activity: Not on file  Stress: Not on file  Social Connections: Not on file  Intimate Partner Violence: Not on file   Family History  Problem Relation Age of Onset    Bipolar disorder Mother    Anxiety disorder Mother    Dementia Mother    Depression Mother    Mental illness Mother    Vision loss Mother    Alzheimer's disease Mother    Alcohol abuse Paternal Uncle    Bipolar disorder Maternal Grandmother    Dementia Maternal Grandmother    Alzheimer's disease Maternal Grandmother    Alcohol abuse Paternal Uncle    Stroke Brother    Deep vein thrombosis Son    Heart disease Father    Hyperlipidemia Father    Hypertension Father    Stroke Father    Vision loss Father    Atrial fibrillation Sister    Heart disease Brother    Heart disease Brother    ADD / ADHD Neg Hx    Drug abuse Neg Hx    OCD Neg Hx    Paranoid behavior Neg Hx    Schizophrenia Neg Hx    Seizures Neg Hx    Sexual abuse Neg Hx    Physical abuse Neg Hx       Review of Systems     Objective:   Physical Exam Vitals reviewed.  Constitutional:      General: She is not in acute distress.    Appearance: She is not ill-appearing or diaphoretic.  Cardiovascular:     Rate and Rhythm: Normal rate and regular rhythm.     Heart sounds: Normal heart sounds. No murmur heard.   No friction rub. No gallop.  Pulmonary:     Effort: Pulmonary effort is normal. No respiratory distress.     Breath sounds: Normal breath sounds. No stridor. No wheezing or rales.  Abdominal:     General: Abdomen is flat. Bowel sounds are normal. There is no distension.     Palpations: Abdomen is soft. There is no mass.     Tenderness: There is no abdominal tenderness.  Musculoskeletal:     Right upper leg: No deformity.  Left upper leg: No deformity.     Right lower leg: No edema.     Left lower leg: No edema.  Neurological:     Mental Status: She is alert.        Assessment & Plan:  Hematuria, unspecified type - Plan: Urinalysis, Routine w reflex microscopic Urinalysis shows no residual blood.  Therefore the lesion has stopped bleeding.  I would like her to resume the Eliquis.  However I  will get her set up to see urology for cystoscopy given her previous history of cancer and previous treatment with radiation in the pelvic area to determine the cause of the gross hematuria

## 2021-10-21 ENCOUNTER — Ambulatory Visit: Payer: Medicare Other | Admitting: Urology

## 2021-10-21 ENCOUNTER — Other Ambulatory Visit: Payer: Self-pay

## 2021-10-21 VITALS — BP 109/63 | HR 88 | Ht 65.0 in | Wt 117.0 lb

## 2021-10-21 DIAGNOSIS — N200 Calculus of kidney: Secondary | ICD-10-CM

## 2021-10-21 DIAGNOSIS — R310 Gross hematuria: Secondary | ICD-10-CM | POA: Diagnosis not present

## 2021-10-21 LAB — URINALYSIS, ROUTINE W REFLEX MICROSCOPIC
Bilirubin, UA: NEGATIVE
Glucose, UA: NEGATIVE
Ketones, UA: NEGATIVE
Leukocytes,UA: NEGATIVE
Nitrite, UA: NEGATIVE
Specific Gravity, UA: 1.02 (ref 1.005–1.030)
Urobilinogen, Ur: 0.2 mg/dL (ref 0.2–1.0)
pH, UA: 5 (ref 5.0–7.5)

## 2021-10-21 LAB — MICROSCOPIC EXAMINATION
RBC, Urine: >30 /hpf — AB (ref 0–2)
Renal Epithel, UA: NONE SEEN /hpf

## 2021-10-21 NOTE — Progress Notes (Signed)
Urological Symptom Review  Patient is experiencing the following symptoms: Hard to postpone urination Get up at night to urinate Blood in urine   Review of Systems  Gastrointestinal (upper)  : Negative for upper GI symptoms  Gastrointestinal (lower) : Negative for lower GI symptoms  Constitutional : Fatigue  Skin: Negative for skin symptoms  Eyes: Negative for eye symptoms  Ear/Nose/Throat : Negative for Ear/Nose/Throat symptoms  Hematologic/Lymphatic: Negative for Hematologic/Lymphatic symptoms  Cardiovascular : Negative for cardiovascular symptoms  Respiratory : Negative for respiratory symptoms  Endocrine: Negative for endocrine symptoms  Musculoskeletal: Negative for musculoskeletal symptoms  Neurological: Negative for neurological symptoms  Psychologic: Depression Anxiety

## 2021-10-21 NOTE — H&P (View-Only) (Signed)
10/21/2021 12:33 PM   Abigail Wagner 12/06/1950 [id number]  Referring provider: Susy Frizzle, MD [address] [place],  [place] [address]  Gross hematuria   HPI: Abigail Wagner is a 71yo here for evaluation of gross hematuria. She was started on eliquis in 06/2021. She then noted gross painless hematuria 3 weeks ago. She has a hx fo nephrolithiasis but CT from 05/2021 shows no renal tumors and no calculi. She has a 50pk tobacco hx. No dysuria. UA today shows >30RBCs   PMH: Past Medical History:  Diagnosis Date   Anal carcinoma ([place])    Chemo and radiation   Anemia    Anxiety    Dr. Harrington Challenger at [facility] (psychiatrist).     Carotid artery disease (HCC)    DDD (degenerative disc disease), lumbosacral    GERD (gastroesophageal reflux disease)    Headache(784.0)    History of adenomatous polyp of colon 10/07/2015   Tubular adenoma high grade dysplasia   History of herpes genitalis    History of kidney stones    History of suicide attempt 2010   HTN (hypertension)    Hyperlipidemia    IBS (irritable bowel syndrome)    Major depression    OA (osteoarthritis)    Ovarian cancer ([place])    Stage IIIB papillary ovarian carcinoma  s/p omentectomy and BSO & chemotherapy (completed 12-07-2014)   Prolapse of vaginal vault after hysterectomy 07/20/2015   Rectovaginal fistula 08/05/2015   Seasonal allergies    Sigmoid diverticulosis    Smokers' cough [facility])     Surgical History: Past Surgical History:  Procedure Laterality Date   BIOPSY  10/12/2020   Procedure: BIOPSY;  Surgeon: Harvel Quale, MD;  [facility];  Service: Gastroenterology;;   BREAST ENHANCEMENT [place]   COLONOSCOPY N/A 10/07/2015   Procedure: COLONOSCOPY;  Surgeon: Rogene Houston, MD;  [facility];  Service: Endoscopy;  Laterality: N/A;  7:30   COLONOSCOPY WITH PROPOFOL N/A 10/12/2020   Procedure: COLONOSCOPY WITH PROPOFOL;  Surgeon: Harvel Quale, MD;  [facility];  Service: Gastroenterology;  Laterality: N/A;  9:00   ESOPHAGOGASTRODUODENOSCOPY (EGD) WITH PROPOFOL N/A 10/12/2020   Procedure: ESOPHAGOGASTRODUODENOSCOPY (EGD) WITH PROPOFOL;  Surgeon: Harvel Quale, MD;  [facility];  Service: Gastroenterology;  Laterality: N/A;   EXPLORATORY LAPAROTOMY/ OMENTECTOMY/  BILATERAL SALPINGOOPHORECTOMY/  PORT-A-CATH PLACEMENT  07-03-2014   [place]   KNEE ARTHROSCOPY Left 09-22-2004   LAPAROSCOPY N/A 06/02/2014   Procedure: DIAGNOSTIC LAPAROSCOPY, OMENTAL BIOPSY, RIGHT OVARY BIOPSY, LYSIS OF ADHESIONS;  Surgeon: Fanny Skates, MD;  Location: [place] [facility];  Service: General;  Laterality: N/A;   MUCOSAL ADVANCEMENT FLAP N/A 06/15/2016   Procedure: EXCISION RECTOVAGINAOL FISTULA WITH MUCOSAL ADVANCEMENT FLAP;  Surgeon: Leighton Ruff, MD;  Location: [place];  Service: General;  Laterality: N/A;   [place] N/A 09/09/2015   Procedure: PLACEMENT OF SETON;  Surgeon: Leighton Ruff, MD;  Location: [facility];  Service: General;  Laterality: N/A;   PORT-A-CATH REMOVAL Right 08/17/2014   Procedure: REMOVAL INTRAPERITONEAL CHEMO PORT;  Surgeon: Fanny Skates, MD;  Location: [place];  Service: General;  Laterality: Right;   PORTACATH PLACEMENT Right 08/17/2014   Procedure:  PLACE NEW PORT A CATH;  Surgeon: Fanny Skates, MD;  Location: [place];  Service: General;  Laterality: Right;   RECTAL BIOPSY N/A 09/09/2015   Procedure:  BIOPSY OF RECTOVAGINAL MASS;  Surgeon: Leighton Ruff, MD;  Location[facility];  Service: General;  Laterality: N/A;   TUBAL LIGATION  YRS AGO   VAGINAL HYSTERECTOMY  1981   fibroids    Home Medications:  Allergies as of 10/21/2021       Reactions   Morphine And Related    Delirium with sbo        Medication List        Accurate as of October 21, 2021 12:33 PM. If you have any  questions, ask your nurse or doctor.          amiodarone 200 MG tablet Commonly known as: PACERONE Take 1 tablet (200 mg total) by mouth daily.   apixaban 5 MG Tabs tablet Commonly known as: ELIQUIS Take 1 tablet (5 mg total) by mouth 2 (two) times daily.   b complex vitamins tablet Take 1 tablet by mouth daily.   Co Q 10 100 MG Caps Take 100 mg by mouth daily.   divalproex 500 MG 24 hr tablet Commonly known as: Depakote ER Take 1 tablet (500 mg total) by mouth daily.   escitalopram 20 MG tablet Commonly known as: Lexapro Take 1 tablet (20 mg total) by mouth 2 (two) times daily. What changed: when to take this   furosemide 40 MG tablet Commonly known as: LASIX TAKE 1 TABLET BY MOUTH EVERY DAY   HAIR SKIN & NAILS ADVANCED PO Take by mouth.   HYDROcodone-acetaminophen 5-325 MG tablet Commonly known as: Norco Take 1 tablet by mouth 3 (three) times daily as needed for moderate pain.   KRILL OIL PO Take 350 mg by mouth daily.   LORazepam 0.5 MG tablet Commonly known as: ATIVAN TAKE 1 TABLET (0.5 MG TOTAL) BY MOUTH 3 (THREE) TIMES DAILY AS NEEDED FOR ANXIETY.   multivitamin tablet Take 1 tablet by mouth daily.   nicotine 14 mg/24hr patch Commonly known as: NICODERM CQ - dosed in mg/24 hours Place 1 patch (14 mg total) onto the skin daily.   omeprazole 40 MG capsule Commonly known as: PRILOSEC Take 1 capsule (40 mg total) by mouth daily.   ondansetron 4 MG tablet Commonly known as: ZOFRAN TAKE 1 TABLET BY MOUTH EVERY 8 HOURS AS NEEDED FOR NAUSEA AND VOMITING   polyethylene glycol 17 g packet Commonly known as: MIRALAX / GLYCOLAX Take 17 g by mouth daily. What changed:  when to take this reasons to take this   rosuvastatin 20 MG tablet Commonly known as: CRESTOR Take 1 tablet (20 mg total) by mouth daily.   Vitamin D-3 125 MCG (5000 UT) Tabs Take 5,000 Units by mouth daily.        Allergies:  Allergies  Allergen Reactions   Morphine And  Related     Delirium with sbo    Family History: Family History  Problem Relation Age of Onset   Bipolar disorder Mother    Anxiety disorder Mother    Dementia Mother    Depression Mother    Mental illness Mother    Vision loss Mother    Alzheimer's disease Mother    Alcohol abuse Paternal Uncle    Bipolar disorder Maternal Grandmother    Dementia Maternal Grandmother    Alzheimer's disease Maternal Grandmother    Alcohol abuse Paternal Uncle    Stroke Brother    Deep vein thrombosis Son    Heart disease Father    Hyperlipidemia Father    Hypertension Father  Stroke Father    Vision loss Father    Atrial fibrillation Sister    Heart disease Brother    Heart disease Brother    ADD / ADHD Neg Hx    Drug abuse Neg Hx    OCD Neg Hx    Paranoid behavior Neg Hx    Schizophrenia Neg Hx    Seizures Neg Hx    Sexual abuse Neg Hx    Physical abuse Neg Hx     Social History:  reports that she quit smoking about 3 months ago. Her smoking use included cigarettes. She has a 21.50 pack-year smoking history. She has never used smokeless tobacco. She reports that she does not drink alcohol and does not use drugs.  ROS: All other review of systems were reviewed and are negative except what is noted above in HPI  Physical Exam: BP 109/63    Pulse 88    Ht 5\' 5"  (1.651 m)    Wt 117 lb (53.1 kg)    BMI 19.47 kg/m   Constitutional:  Alert and oriented, No acute distress. HEENT: [place] AT, moist mucus membranes.  Trachea midline, no masses. Cardiovascular: No clubbing, cyanosis, or edema. Respiratory: Normal respiratory effort, no increased work of breathing. GI: Abdomen is soft, nontender, nondistended, no abdominal masses GU: No CVA tenderness.  Lymph: No cervical or inguinal lymphadenopathy. Skin: No rashes, bruises or suspicious lesions. Neurologic: Grossly intact, no focal deficits, moving all 4 extremities. Psychiatric: Normal mood and affect.  Laboratory Data: Lab Results   Component Value Date   WBC 5.5 10/06/2021   HGB 12.2 10/06/2021   HCT 35.9 10/06/2021   MCV 97.8 10/06/2021   PLT 263 10/06/2021    Lab Results  Component Value Date   CREATININE 0.68 10/06/2021    No results found for: PSA  No results found for: TESTOSTERONE  No results found for: HGBA1C  Urinalysis    Component Value Date/Time   COLORURINE DARK YELLOW 10/13/2021 1206   APPEARANCEUR SLIGHTLY CLOUDY (A) 10/13/2021 1206   APPEARANCEUR Cloudy (A) 08/03/2015 1100   LABSPEC 1.020 10/13/2021 1206   PHURINE 6.0 10/13/2021 1206   GLUCOSEU NEGATIVE 10/13/2021 1206   [place] 10/13/2021 1206   [place] 05/25/2020 1103   BILIRUBINUR Negative 08/03/2015 1100   KETONESUR NEGATIVE 10/13/2021 1206   PROTEINUR TRACE (A) 10/13/2021 1206   UROBILINOGEN 0.2 05/27/2014 1428   NITRITE NEGATIVE 10/13/2021 1206   LEUKOCYTESUR NEGATIVE 10/13/2021 1206    Lab Results  Component Value Date   LABMICR See below: 08/03/2015   WBCUA >30 (A) 08/03/2015   RBCUA 3-10 (A) 08/03/2015   LABEPIT 0-10 08/03/2015   MUCUS Present 08/03/2015   BACTERIA NONE SEEN 10/13/2021    Pertinent Imaging: CT 06/02/2021: Images reviewed and discussed with the patient  No results found for this or any previous visit.  No results found for this or any previous visit.  No results found for this or any previous visit.  No results found for this or any previous visit.  No results found for this or any previous visit.  No results found for this or any previous visit.  No results found for this or any previous visit.  No results found for this or any previous visit.   Assessment & Plan:    1.  Gross hematuria -RTC 1 weeks for cystoscopy -Urine for cytology - Urinalysis, Routine w reflex microscopic   No follow-ups on file.  Nicolette Bang, MD  [facility] [place]

## 2021-10-21 NOTE — Progress Notes (Signed)
10/21/2021 12:33 PM   Abigail Wagner Abigail Wagner [birth year] [id number]  Referring provider: Susy Frizzle, MD [address] [place],  [place] [address]  Gross hematuria   HPI: Abigail Wagner is a 71yo here for evaluation of gross hematuria. She was started on eliquis in 06/2021. She then noted gross painless hematuria 3 weeks ago. She has a hx fo nephrolithiasis but CT from 05/2021 shows no renal tumors and no calculi. She has a 50pk tobacco hx. No dysuria. UA today shows >30RBCs   PMH: Past Medical History:  Diagnosis Date   Anal carcinoma ([place])    Chemo and radiation   Anemia    Anxiety    Dr. Harrington Challenger at [facility] (psychiatrist).     Carotid artery disease (HCC)    DDD (degenerative disc disease), lumbosacral    GERD (gastroesophageal reflux disease)    Headache(784.0)    History of adenomatous polyp of colon 10/07/2015   Tubular adenoma high grade dysplasia   History of herpes genitalis    History of kidney stones    History of suicide attempt 2010   HTN (hypertension)    Hyperlipidemia    IBS (irritable bowel syndrome)    Major depression    OA (osteoarthritis)    Ovarian cancer ([place])    Stage IIIB papillary ovarian carcinoma  s/p omentectomy and BSO & chemotherapy (completed 12-07-2014)   Prolapse of vaginal vault after hysterectomy 07/20/2015   Rectovaginal fistula 08/05/2015   Seasonal allergies    Sigmoid diverticulosis    Smokers' cough [facility])     Surgical History: Past Surgical History:  Procedure Laterality Date   BIOPSY  10/12/2020   Procedure: BIOPSY;  Surgeon: Harvel Quale, MD;  [facility];  Service: Gastroenterology;;   BREAST ENHANCEMENT [place]   COLONOSCOPY N/A 10/07/2015   Procedure: COLONOSCOPY;  Surgeon: Rogene Houston, MD;  [facility];  Service: Endoscopy;  Laterality: N/A;  7:30   COLONOSCOPY WITH PROPOFOL N/A 10/12/2020   Procedure: COLONOSCOPY WITH PROPOFOL;  Surgeon: Harvel Quale, MD;  [facility];  Service: Gastroenterology;  Laterality: N/A;  9:00   ESOPHAGOGASTRODUODENOSCOPY (EGD) WITH PROPOFOL N/A 10/12/2020   Procedure: ESOPHAGOGASTRODUODENOSCOPY (EGD) WITH PROPOFOL;  Surgeon: Harvel Quale, MD;  [facility];  Service: Gastroenterology;  Laterality: N/A;   EXPLORATORY LAPAROTOMY/ OMENTECTOMY/  BILATERAL SALPINGOOPHORECTOMY/  PORT-A-CATH PLACEMENT  07-03-2014   [place]   KNEE ARTHROSCOPY Left 09-22-2004   LAPAROSCOPY N/A 06/02/2014   Procedure: DIAGNOSTIC LAPAROSCOPY, OMENTAL BIOPSY, RIGHT OVARY BIOPSY, LYSIS OF ADHESIONS;  Surgeon: Fanny Skates, MD;  Location: [place];  Service: General;  Laterality: N/A;   MUCOSAL ADVANCEMENT FLAP N/A 06/15/2016   Procedure: EXCISION RECTOVAGINAOL FISTULA WITH MUCOSAL ADVANCEMENT FLAP;  Surgeon: Leighton Ruff, MD;  Location: [place];  Service: General;  Laterality: N/A;   [place] N/A 09/09/2015   Procedure: PLACEMENT OF SETON;  Surgeon: Leighton Ruff, MD;  Location: [facility];  Service: General;  Laterality: N/A;   PORT-A-CATH REMOVAL Right 08/17/2014   Procedure: REMOVAL INTRAPERITONEAL CHEMO PORT;  Surgeon: Fanny Skates, MD;  Location: [place];  Service: General;  Laterality: Right;   PORTACATH PLACEMENT Right 08/17/2014   Procedure:  PLACE NEW PORT A CATH;  Surgeon: Fanny Skates, MD;  Location: [place];  Service: General;  Laterality: Right;   RECTAL BIOPSY N/A 09/09/2015   Procedure:  BIOPSY OF RECTOVAGINAL MASS;  Surgeon: Leighton Ruff, MD;  Location: [facility];  Service: General;  Laterality: N/A;   TUBAL LIGATION  YRS AGO   VAGINAL HYSTERECTOMY  1981   fibroids    Home Medications:  Allergies as of 10/21/2021       Reactions   Morphine And Related    Delirium with sbo        Medication List        Accurate as of October 21, 2021 12:33 PM. If you have any  questions, ask your nurse or doctor.          amiodarone 200 MG tablet Commonly known as: PACERONE Take 1 tablet (200 mg total) by mouth daily.   apixaban 5 MG Tabs tablet Commonly known as: ELIQUIS Take 1 tablet (5 mg total) by mouth 2 (two) times daily.   b complex vitamins tablet Take 1 tablet by mouth daily.   Co Q 10 100 MG Caps Take 100 mg by mouth daily.   divalproex 500 MG 24 hr tablet Commonly known as: Depakote ER Take 1 tablet (500 mg total) by mouth daily.   escitalopram 20 MG tablet Commonly known as: Lexapro Take 1 tablet (20 mg total) by mouth 2 (two) times daily. What changed: when to take this   furosemide 40 MG tablet Commonly known as: LASIX TAKE 1 TABLET BY MOUTH EVERY DAY   HAIR SKIN & NAILS ADVANCED PO Take by mouth.   HYDROcodone-acetaminophen 5-325 MG tablet Commonly known as: Norco Take 1 tablet by mouth 3 (three) times daily as needed for moderate pain.   KRILL OIL PO Take 350 mg by mouth daily.   LORazepam 0.5 MG tablet Commonly known as: ATIVAN TAKE 1 TABLET (0.5 MG TOTAL) BY MOUTH 3 (THREE) TIMES DAILY AS NEEDED FOR ANXIETY.   multivitamin tablet Take 1 tablet by mouth daily.   nicotine 14 mg/24hr patch Commonly known as: NICODERM CQ - dosed in mg/24 hours Place 1 patch (14 mg total) onto the skin daily.   omeprazole 40 MG capsule Commonly known as: PRILOSEC Take 1 capsule (40 mg total) by mouth daily.   ondansetron 4 MG tablet Commonly known as: ZOFRAN TAKE 1 TABLET BY MOUTH EVERY 8 HOURS AS NEEDED FOR NAUSEA AND VOMITING   polyethylene glycol 17 g packet Commonly known as: MIRALAX / GLYCOLAX Take 17 g by mouth daily. What changed:  when to take this reasons to take this   rosuvastatin 20 MG tablet Commonly known as: CRESTOR Take 1 tablet (20 mg total) by mouth daily.   Vitamin D-3 125 MCG (5000 UT) Tabs Take 5,000 Units by mouth daily.        Allergies:  Allergies  Allergen Reactions   Morphine And  Related     Delirium with sbo    Family History: Family History  Problem Relation Age of Onset   Bipolar disorder Mother    Anxiety disorder Mother    Dementia Mother    Depression Mother    Mental illness Mother    Vision loss Mother    Alzheimer's disease Mother    Alcohol abuse Paternal Uncle    Bipolar disorder Maternal Grandmother    Dementia Maternal Grandmother    Alzheimer's disease Maternal Grandmother    Alcohol abuse Paternal Uncle    Stroke Brother    Deep vein thrombosis Son    Heart disease Father    Hyperlipidemia Father    Hypertension Father  Stroke Father    Vision loss Father    Atrial fibrillation Sister    Heart disease Brother    Heart disease Brother    ADD / ADHD Neg Hx    Drug abuse Neg Hx    OCD Neg Hx    Paranoid behavior Neg Hx    Schizophrenia Neg Hx    Seizures Neg Hx    Sexual abuse Neg Hx    Physical abuse Neg Hx     Social History:  reports that she quit smoking about 3 months ago. Her smoking use included cigarettes. She has a 21.50 pack-year smoking history. She has never used smokeless tobacco. She reports that she does not drink alcohol and does not use drugs.  ROS: All other review of systems were reviewed and are negative except what is noted above in HPI  Physical Exam: BP 109/63    Pulse 88    Ht 5\' 5"  (1.651 m)    Wt 117 lb (53.1 kg)    BMI 19.47 kg/m   Constitutional:  Alert and oriented, No acute distress. HEENT: [place] AT, moist mucus membranes.  Trachea midline, no masses. Cardiovascular: No clubbing, cyanosis, or edema. Respiratory: Normal respiratory effort, no increased work of breathing. GI: Abdomen is soft, nontender, nondistended, no abdominal masses GU: No CVA tenderness.  Lymph: No cervical or inguinal lymphadenopathy. Skin: No rashes, bruises or suspicious lesions. Neurologic: Grossly intact, no focal deficits, moving all 4 extremities. Psychiatric: Normal mood and affect.  Laboratory Data: Lab Results   Component Value Date   WBC 5.5 10/06/2021   HGB 12.2 10/06/2021   HCT 35.9 10/06/2021   MCV 97.8 10/06/2021   PLT 263 10/06/2021    Lab Results  Component Value Date   CREATININE 0.68 10/06/2021    No results found for: PSA  No results found for: TESTOSTERONE  No results found for: HGBA1C  Urinalysis    Component Value Date/Time   COLORURINE DARK YELLOW 10/13/2021 1206   APPEARANCEUR SLIGHTLY CLOUDY (A) 10/13/2021 1206   APPEARANCEUR Cloudy (A) 08/03/2015 1100   LABSPEC 1.020 10/13/2021 1206   PHURINE 6.0 10/13/2021 1206   GLUCOSEU NEGATIVE 10/13/2021 1206   [place] 10/13/2021 1206   [place] 05/25/2020 1103   BILIRUBINUR Negative 08/03/2015 1100   KETONESUR NEGATIVE 10/13/2021 1206   PROTEINUR TRACE (A) 10/13/2021 1206   UROBILINOGEN 0.2 05/27/2014 1428   NITRITE NEGATIVE 10/13/2021 1206   LEUKOCYTESUR NEGATIVE 10/13/2021 1206    Lab Results  Component Value Date   LABMICR See below: 08/03/2015   WBCUA >30 (A) 08/03/2015   RBCUA 3-10 (A) 08/03/2015   LABEPIT 0-10 08/03/2015   MUCUS Present 08/03/2015   BACTERIA NONE SEEN 10/13/2021    Pertinent Imaging: CT 06/02/2021: Images reviewed and discussed with the patient  No results found for this or any previous visit.  No results found for this or any previous visit.  No results found for this or any previous visit.  No results found for this or any previous visit.  No results found for this or any previous visit.  No results found for this or any previous visit.  No results found for this or any previous visit.  No results found for this or any previous visit.   Assessment & Plan:    1.  Gross hematuria -RTC 1 weeks for cystoscopy -Urine for cytology - Urinalysis, Routine w reflex microscopic   No follow-ups on file.  Nicolette Bang, MD  [facility] [place]

## 2021-10-21 NOTE — Patient Instructions (Signed)

## 2021-10-24 LAB — CYTOLOGY, URINE

## 2021-10-25 DIAGNOSIS — E785 Hyperlipidemia, unspecified: Secondary | ICD-10-CM

## 2021-10-25 DIAGNOSIS — I10 Essential (primary) hypertension: Secondary | ICD-10-CM

## 2021-10-26 ENCOUNTER — Ambulatory Visit: Payer: Medicare Other | Admitting: Urology

## 2021-10-26 ENCOUNTER — Other Ambulatory Visit: Payer: Self-pay | Admitting: Urology

## 2021-10-26 ENCOUNTER — Other Ambulatory Visit: Payer: Self-pay

## 2021-10-26 ENCOUNTER — Encounter: Payer: Self-pay | Admitting: Urology

## 2021-10-26 VITALS — BP 124/77 | HR 82 | Wt 117.6 lb

## 2021-10-26 DIAGNOSIS — R310 Gross hematuria: Secondary | ICD-10-CM | POA: Diagnosis not present

## 2021-10-26 LAB — MICROSCOPIC EXAMINATION
RBC, Urine: >30 /hpf — AB (ref 0–2)
Renal Epithel, UA: NONE SEEN /hpf

## 2021-10-26 LAB — URINALYSIS, ROUTINE W REFLEX MICROSCOPIC
Bilirubin, UA: NEGATIVE
Glucose, UA: NEGATIVE
Ketones, UA: NEGATIVE
Nitrite, UA: NEGATIVE
Specific Gravity, UA: 1.02 (ref 1.005–1.030)
Urobilinogen, Ur: 0.2 mg/dL (ref 0.2–1.0)
pH, UA: 7 (ref 5.0–7.5)

## 2021-10-26 MED ORDER — CIPROFLOXACIN HCL 500 MG PO TABS
500.0000 mg | ORAL_TABLET | Freq: Once | ORAL | Status: AC
  Administered 2021-10-26: 500 mg via ORAL

## 2021-10-26 NOTE — Progress Notes (Signed)
Letter sent to provider to hold eliquis 2 days prior

## 2021-10-26 NOTE — Patient Instructions (Signed)
Transurethral Resection of Bladder Tumor Transurethral resection of a bladder tumor is the removal (resection) of a cancerous growth (tumor) on the inside wall of the bladder. The bladder is the organ that holds urine. The tumor is removed through the tube that carries urine out of the body (urethra). In a transurethral resection, a thin telescope with a light, a tiny camera, and an electric cutting edge (resectoscope) is passed through the urethra. In men, the opening of the urethra is at the end of the penis. In women, it is just above the opening of the vagina. Tell a health care provider about: Any allergies you have. All medicines you are taking, including vitamins, herbs, eye drops, creams, and over-the-counter medicines. Any problems you or family members have had with anesthetic medicines. Any blood disorders you have. Any surgeries you have had. Any medical conditions you have. Any recent urinary tract infections you have had. Whether you are pregnant or may be pregnant. What are the risks? Generally, this is a safe procedure. However, problems may occur, including: Infection. Bleeding. Allergic reactions to medicines. Damage to nearby structures or organs, such as: The urethra. The tubes that drain urine from the kidneys into the bladder (ureters). Pain and burning during urination. Difficulty urinating due to partial blockage of the urethra. Inability to urinate (urinary retention). What happens before the procedure? Staying hydrated Follow instructions from your health care provider about hydration, which may include: Up to 2 hours before the procedure - you may continue to drink clear liquids, such as water, clear fruit juice, black coffee, and plain tea.  Eating and drinking restrictions Follow instructions from your health care provider about eating and drinking, which may include: 8 hours before the procedure - stop eating heavy meals or foods, such as meat, fried foods,  or fatty foods. 6 hours before the procedure - stop eating light meals or foods, such as toast or cereal. 6 hours before the procedure - stop drinking milk or drinks that contain milk. 2 hours before the procedure - stop drinking clear liquids. Medicines Ask your health care provider about: Changing or stopping your regular medicines. This is especially important if you are taking diabetes medicines or blood thinners. Taking medicines such as aspirin and ibuprofen. These medicines can thin your blood. Do not take these medicines unless your health care provider tells you to take them. Taking over-the-counter medicines, vitamins, herbs, and supplements. Tests You may have exams or tests, including: Physical exam. Blood tests. Urine tests. Electrocardiogram (ECG). This test measures the electrical activity of the heart. General instructions Plan to have someone take you home from the hospital or clinic. Ask your health care provider how your surgical site will be marked or identified. Ask your health care provider what steps will be taken to help prevent infection. These may include: Washing skin with a germ-killing soap. Taking antibiotic medicine. What happens during the procedure? An IV will be inserted into one of your veins. You will be given one or more of the following: A medicine to help you relax (sedative). A medicine to make you fall asleep (general anesthetic). A medicine that is injected into your spine to numb the area below and slightly above the injection site (spinal anesthetic). Your legs will be placed in foot rests (stirrups) so that your legs are apart and your knees are bent. The resectoscope will be passed through your urethra and into your bladder. The part of your bladder that is affected by the tumor will be  resected using the cutting edge of the resectoscope. The resectoscope will be removed. A thin, flexible tube (catheter) will be passed through your urethra  and into your bladder. The catheter will drain urine into a bag outside of your body. Fluid may be passed through the catheter to keep the catheter open. The procedure may vary among health care providers and hospitals. What happens after the procedure? Your blood pressure, heart rate, breathing rate, and blood oxygen level will be monitored until you leave the hospital or clinic. You may continue to receive fluids and medicines through an IV. You will have some pain. You will be given pain medicine to relieve pain. You will have a catheter to drain your urine. You will have blood in your urine. Your catheter may be kept in until your urine is clear. The amount of urine will be monitored. If necessary, your bladder may be rinsed out (irrigated) by passing fluid through your catheter. You will be encouraged to walk around as soon as possible. You may have to wear compression stockings. These stockings help to prevent blood clots and reduce swelling in your legs. Do not drive for 24 hours if you were given a sedative during your procedure. Summary Transurethral resection of a bladder tumor is the removal (resection) of a cancerous growth (tumor) on the inside wall of the bladder. To do this procedure, your health care provider uses a thin telescope with a light, a tiny camera, and an electric cutting edge (resectoscope). Follow your health care provider's instructions. You may need to stop or change certain medicines, and you may be told to stop eating and drinking several hours before the procedure. Your blood pressure, heart rate, breathing rate, and blood oxygen level will be monitored until you leave the hospital or clinic. You may have to wear compression stockings. These stockings help to prevent blood clots and reduce swelling in your legs. This information is not intended to replace advice given to you by your health care provider. Make sure you discuss any questions you have with your  health care provider. Document Revised: 04/11/2018 Document Reviewed: 04/12/2018 Elsevier Patient Education  [place].

## 2021-10-26 NOTE — Progress Notes (Signed)
° °  10/26/21  CC: gross hematuria   HPI: Ms Abigail Wagner is a 71yo here for cystoscopy Blood pressure 124/77, pulse 82, weight 117 lb 9.6 oz (53.3 kg). NED. A&Ox3.   No respiratory distress   Abd soft, NT, ND Normal external genitalia with patent urethral meatus  Cystoscopy Procedure Note  Patient identification was confirmed, informed consent was obtained, and patient was prepped using Betadine solution.  Lidocaine jelly was administered per urethral meatus.    Procedure: - Flexible cystoscope introduced, without any difficulty.   - Thorough search of the bladder revealed:    normal urethral meatus    normal urothelium    no stones    no ulcers     1cm posterior wall tumor with multiple tortuous blood vessels    no urethral polyps    no trabeculation  - Ureteral orifices were normal in position and appearance.  Post-Procedure: - Patient tolerated the procedure well  Assessment/ Plan: We discussed the management of bladder tumor including observation versus endoscopic resection and after discussing the options the patient elects for endoscopic resection. Risks/benefits/alternatives discussed   No follow-ups on file.  Nicolette Bang, MD

## 2021-10-26 NOTE — Progress Notes (Signed)
*  Diagnosis: Bladder Tumor   *Procedure:     TURBT <2cm (43142) ( 30 min case)  *Facility Preference: [facility]   *Clearance needed: no   *Anticoagulation Instructions: Hold all anticoagulants- letter sent to PCP to hold Eliquis 2 days prior pt aware of medication hold   *Aspirin Instructions: Ok to continue Aspirin  Case posted- 10/26/2021 Orders placed- 10/26/2021 Insurance approval received- 10/26/2021 Patient notified of surgery date- 10/26/2021 Post op appt made- 11/09/2021 voiding trial.

## 2021-10-26 NOTE — Progress Notes (Signed)
Urological Symptom Review  Patient is experiencing the following symptoms: Hard to postpone urination Get up at night to urinate Blood in urine   Review of Systems  Gastrointestinal (upper)  : Negative for upper GI symptoms  Gastrointestinal (lower) : Negative for lower GI symptoms  Constitutional : Negative for symptoms  Skin: Negative for skin symptoms  Eyes: Negative for eye symptoms  Ear/Nose/Throat : Negative for Ear/Nose/Throat symptoms  Hematologic/Lymphatic: Easy bruising  Cardiovascular : Negative for cardiovascular symptoms  Respiratory : Negative for respiratory symptoms  Endocrine: Negative for endocrine symptoms  Musculoskeletal: Back pain  Neurological: Headaches  Psychologic: Depression Anxiety

## 2021-10-26 NOTE — Progress Notes (Signed)
Surgical Physician Order Form [facility]  * Scheduling expectation :  11/03/2021  *Length of Case: 30 minutes  *MD Preforming Case: Nicolette Bang, MD  *Assistant Needed: no  *Facility Preference: [facility]  *Clearance needed: no  *Anticoagulation Instructions: Hold all anticoagulants  *Aspirin Instructions: Ok to continue Aspirin  -Admit type: Outpatient  -Anesthesia: General  -Use Standing Orders:  NA  *Diagnosis: Bladder Tumor  *Procedure:     TURBT <2cm (62563)  Additional orders: N/A  -Equipment:  NA -VTE Prophylaxis Standing Order SCDs       Other:   -Standing Lab Orders Per Anesthesia    Lab other: None  -Standing Test orders EKG/Chest x-ray per Anesthesia       Test other:   - Medications:  Ancef 2gm IV  -Other orders:  Ok to proceed with Ancef PCN allergy reviewed  *Post-op visit Date/Instructions:  1 week voiding trial

## 2021-10-27 NOTE — Patient Instructions (Signed)
Abigail Wagner  10/27/2021     @PREFPERIOPPHARMACY @   Your procedure is scheduled on  11/03/2021.   Report to [facility] at  [phone] A.M.   Call this number if you have problems the morning of surgery:  [phone]   Remember:  Do not eat or drink after midnight.      Your last dose of eliquis should be on 10/31/2021.    Take these medicines the morning of surgery with A SIP OF WATER          pacerone, depakote, lexapro, hydrocodone(if needed), ativan(if needed), prilosec, zofran (if needed).      Do not wear jewelry, make-up or nail polish.  Do not wear lotions, powders, or perfumes, or deodorant.  Do not shave 48 hours prior to surgery.  Men may shave face and neck.  Do not bring valuables to the hospital.  [facility] is not responsible for any belongings or valuables.  Contacts, dentures or bridgework may not be worn into surgery.  Leave your suitcase in the car.  After surgery it may be brought to your room.  For patients admitted to the hospital, discharge time will be determined by your treatment team.  Patients discharged the day of surgery will not be allowed to drive home and must have someone with them for 24 hours.    Special instructions:   DO NOT smoke tobacco or vape for 24 hours before your procedure.  Please read over the following fact sheets that you were given. Coughing and Deep Breathing, Surgical Site Infection Prevention, Anesthesia Post-op Instructions, and Care and Recovery After Surgery      Transurethral Resection of Bladder Tumor, Care After This sheet gives you information about how to care for yourself after your procedure. Your health care provider may also give you more specific instructions. If you have problems or questions, contact your health care provider. What can I expect after the procedure? After the procedure, it is common to have: A small amount of blood in your urine for up to 2 weeks. Soreness or mild pain from your  catheter. After your catheter is removed, you may have mild soreness, especially when urinating. Pain in your lower abdomen. Follow these instructions at home: Medicines  Take over-the-counter and prescription medicines only as told by your health care provider. If you were prescribed an antibiotic medicine, take it as told by your health care provider. Do not stop taking the antibiotic even if you start to feel better. Do not drive for 24 hours if you were given a sedative during your procedure. Ask your health care provider if the medicine prescribed to you: Requires you to avoid driving or using heavy machinery. Can cause constipation. You may need to take these actions to prevent or treat constipation: Take over-the-counter or prescription medicines. Eat foods that are high in fiber, such as beans, whole grains, and fresh fruits and vegetables. Limit foods that are high in fat and processed sugars, such as fried or sweet foods. Activity Return to your normal activities as told by your health care provider. Ask your health care provider what activities are safe for you. Do not lift anything that is heavier than 10 lb (4.5 kg), or the limit that you are told, until your health care provider says that it is safe. Avoid intense physical activity for as long as told by your health care provider. Rest as told by your health care provider. Avoid  sitting for a long time without moving. Get up to take short walks every 1-2 hours. This is important to improve blood flow and breathing. Ask for help if you feel weak or unsteady. General instructions  Do not drink alcohol for as long as told by your health care provider. This is especially important if you are taking prescription pain medicines. Do not take baths, swim, or use a hot tub until your health care provider approves. Ask your health care provider if you may take showers. You may only be allowed to take sponge baths. If you have a catheter,  follow instructions from your health care provider about caring for your catheter and your drainage bag. Drink enough fluid to keep your urine pale yellow. Wear compression stockings as told by your health care provider. These stockings help to prevent blood clots and reduce swelling in your legs. Keep all follow-up visits as told by your health care provider. This is important. You will need to be followed closely with regular checks of your bladder and urethra (cystoscopies) to make sure that the cancer does not come back. Contact a health care provider if: You have pain that gets worse or does not improve with medicine. You have blood in your urine for more than 2 weeks. You have cloudy or bad-smelling urine. You become constipated. Signs of constipation may include having: Fewer than three bowel movements in a week. Difficulty having a bowel movement. Stools that are dry, hard, or larger than normal. You have a fever. Get help right away if: You have: Severe pain. Bright red blood in your urine. Blood clots in your urine. A lot of blood in your urine. Your catheter has been removed and you are not able to urinate. You have a catheter in place and the catheter is not draining urine. Summary After your procedure, it is common to have a small amount of blood in your urine, soreness or mild pain from your catheter, and pain in your lower abdomen. Take over-the-counter and prescription medicines only as told by your health care provider. Rest as told by your health care provider. Follow your health care provider's instructions about returning to normal activities. Ask what activities are safe for you. If you have a catheter, follow instructions from your health care provider about caring for your catheter and your drainage bag. Get help right away if you cannot urinate, you have severe pain, or you have bright red blood or blood clots in your urine. This information is not intended to  replace advice given to you by your health care provider. Make sure you discuss any questions you have with your health care provider. Document Revised: 04/11/2018 Document Reviewed: 04/11/2018 Elsevier Patient Education  [place] Anesthesia, Adult, Care After This sheet gives you information about how to care for yourself after your procedure. Your health care provider may also give you more specific instructions. If you have problems or questions, contact your health care provider. What can I expect after the procedure? After the procedure, the following side effects are common: Pain or discomfort at the IV site. Nausea. Vomiting. Sore throat. Trouble concentrating. Feeling cold or chills. Feeling weak or tired. Sleepiness and fatigue. Soreness and body aches. These side effects can affect parts of the body that were not involved in surgery. Follow these instructions at home: For the time period you were told by your health care provider:  Rest. Do not participate in activities where you could fall or become  injured. Do not drive or use machinery. Do not drink alcohol. Do not take sleeping pills or medicines that cause drowsiness. Do not make important decisions or sign legal documents. Do not take care of children on your own. Eating and drinking Follow any instructions from your health care provider about eating or drinking restrictions. When you feel hungry, start by eating small amounts of foods that are soft and easy to digest (bland), such as toast. Gradually return to your regular diet. Drink enough fluid to keep your urine pale yellow. If you vomit, rehydrate by drinking water, juice, or clear broth. General instructions If you have sleep apnea, surgery and certain medicines can increase your risk for breathing problems. Follow instructions from your health care provider about wearing your sleep device: Anytime you are sleeping, including during daytime  naps. While taking prescription pain medicines, sleeping medicines, or medicines that make you drowsy. Have a responsible adult stay with you for the time you are told. It is important to have someone help care for you until you are awake and alert. Return to your normal activities as told by your health care provider. Ask your health care provider what activities are safe for you. Take over-the-counter and prescription medicines only as told by your health care provider. If you smoke, do not smoke without supervision. Keep all follow-up visits as told by your health care provider. This is important. Contact a health care provider if: You have nausea or vomiting that does not get better with medicine. You cannot eat or drink without vomiting. You have pain that does not get better with medicine. You are unable to pass urine. You develop a skin rash. You have a fever. You have redness around your IV site that gets worse. Get help right away if: You have difficulty breathing. You have chest pain. You have blood in your urine or stool, or you vomit blood. Summary After the procedure, it is common to have a sore throat or nausea. It is also common to feel tired. Have a responsible adult stay with you for the time you are told. It is important to have someone help care for you until you are awake and alert. When you feel hungry, start by eating small amounts of foods that are soft and easy to digest (bland), such as toast. Gradually return to your regular diet. Drink enough fluid to keep your urine pale yellow. Return to your normal activities as told by your health care provider. Ask your health care provider what activities are safe for you. This information is not intended to replace advice given to you by your health care provider. Make sure you discuss any questions you have with your health care provider. Document Revised: 05/27/2020 Document Reviewed: 12/25/2019 Elsevier Patient  Education  2022 [employer]. How to Use Chlorhexidine for Bathing Chlorhexidine gluconate (CHG) is a germ-killing (antiseptic) solution that is used to clean the skin. It can get rid of the bacteria that normally live on the skin and can keep them away for about 24 hours. To clean your skin with CHG, you may be given: A CHG solution to use in the shower or as part of a sponge bath. A prepackaged cloth that contains CHG. Cleaning your skin with CHG may help lower the risk for infection: While you are staying in the intensive care unit of the hospital. If you have a vascular access, such as a central line, to provide short-term or long-term access to your veins. If you  have a catheter to drain urine from your bladder. If you are on a ventilator. A ventilator is a machine that helps you breathe by moving air in and out of your lungs. After surgery. What are the risks? Risks of using CHG include: A skin reaction. Hearing loss, if CHG gets in your ears and you have a perforated eardrum. Eye injury, if CHG gets in your eyes and is not rinsed out. The CHG product catching fire. Make sure that you avoid smoking and flames after applying CHG to your skin. Do not use CHG: If you have a chlorhexidine allergy or have previously reacted to chlorhexidine. On babies younger than 43 months of age. How to use CHG solution Use CHG only as told by your health care provider, and follow the instructions on the label. Use the full amount of CHG as directed. Usually, this is one bottle. During a shower Follow these steps when using CHG solution during a shower (unless your health care provider gives you different instructions): Start the shower. Use your normal soap and shampoo to wash your face and hair. Turn off the shower or move out of the shower stream. Pour the CHG onto a clean washcloth. Do not use any type of brush or rough-edged sponge. Starting at your neck, lather your body down to your toes. Make  sure you follow these instructions: If you will be having surgery, pay special attention to the part of your body where you will be having surgery. Scrub this area for at least 1 minute. Do not use CHG on your head or face. If the solution gets into your ears or eyes, rinse them well with water. Avoid your genital area. Avoid any areas of skin that have broken skin, cuts, or scrapes. Scrub your back and under your arms. Make sure to wash skin folds. Let the lather sit on your skin for 1-2 minutes or as long as told by your health care provider. Thoroughly rinse your entire body in the shower. Make sure that all body creases and crevices are rinsed well. Dry off with a clean towel. Do not put any substances on your body afterward--such as powder, lotion, or perfume--unless you are told to do so by your health care provider. Only use lotions that are recommended by the manufacturer. Put on clean clothes or pajamas. If it is the night before your surgery, sleep in clean sheets.  During a sponge bath Follow these steps when using CHG solution during a sponge bath (unless your health care provider gives you different instructions): Use your normal soap and shampoo to wash your face and hair. Pour the CHG onto a clean washcloth. Starting at your neck, lather your body down to your toes. Make sure you follow these instructions: If you will be having surgery, pay special attention to the part of your body where you will be having surgery. Scrub this area for at least 1 minute. Do not use CHG on your head or face. If the solution gets into your ears or eyes, rinse them well with water. Avoid your genital area. Avoid any areas of skin that have broken skin, cuts, or scrapes. Scrub your back and under your arms. Make sure to wash skin folds. Let the lather sit on your skin for 1-2 minutes or as long as told by your health care provider. Using a different clean, wet washcloth, thoroughly rinse your entire  body. Make sure that all body creases and crevices are rinsed well. Dry off  with a clean towel. Do not put any substances on your body afterward--such as powder, lotion, or perfume--unless you are told to do so by your health care provider. Only use lotions that are recommended by the manufacturer. Put on clean clothes or pajamas. If it is the night before your surgery, sleep in clean sheets. How to use CHG prepackaged cloths Only use CHG cloths as told by your health care provider, and follow the instructions on the label. Use the CHG cloth on clean, dry skin. Do not use the CHG cloth on your head or face unless your health care provider tells you to. When washing with the CHG cloth: Avoid your genital area. Avoid any areas of skin that have broken skin, cuts, or scrapes. Before surgery Follow these steps when using a CHG cloth to clean before surgery (unless your health care provider gives you different instructions): Using the CHG cloth, vigorously scrub the part of your body where you will be having surgery. Scrub using a back-and-forth motion for 3 minutes. The area on your body should be completely wet with CHG when you are done scrubbing. Do not rinse. Discard the cloth and let the area air-dry. Do not put any substances on the area afterward, such as powder, lotion, or perfume. Put on clean clothes or pajamas. If it is the night before your surgery, sleep in clean sheets.  For general bathing Follow these steps when using CHG cloths for general bathing (unless your health care provider gives you different instructions). Use a separate CHG cloth for each area of your body. Make sure you wash between any folds of skin and between your fingers and toes. Wash your body in the following order, switching to a new cloth after each step: The front of your neck, shoulders, and chest. Both of your arms, under your arms, and your hands. Your stomach and groin area, avoiding the genitals. Your  right leg and foot. Your left leg and foot. The back of your neck, your back, and your buttocks. Do not rinse. Discard the cloth and let the area air-dry. Do not put any substances on your body afterward--such as powder, lotion, or perfume--unless you are told to do so by your health care provider. Only use lotions that are recommended by the manufacturer. Put on clean clothes or pajamas. Contact a health care provider if: Your skin gets irritated after scrubbing. You have questions about using your solution or cloth. You swallow any chlorhexidine. Call your local poison control center (1-956-483-7441 in the [place] Get help right away if: Your eyes itch badly, or they become very red or swollen. Your skin itches badly and is red or swollen. Your hearing changes. You have trouble seeing. You have swelling or tingling in your mouth or throat. You have trouble breathing. These symptoms may represent a serious problem that is an emergency. Do not wait to see if the symptoms will go away. Get medical help right away. Call your local emergency services (911 in the [place] Do not drive yourself to the hospital. Summary Chlorhexidine gluconate (CHG) is a germ-killing (antiseptic) solution that is used to clean the skin. Cleaning your skin with CHG may help to lower your risk for infection. You may be given CHG to use for bathing. It may be in a bottle or in a prepackaged cloth to use on your skin. Carefully follow your health care provider's instructions and the instructions on the product label. Do not use CHG if you have a chlorhexidine  allergy. Contact your health care provider if your skin gets irritated after scrubbing. This information is not intended to replace advice given to you by your health care provider. Make sure you discuss any questions you have with your health care provider. Document Revised: 11/22/2020 Document Reviewed: 11/22/2020 Elsevier Patient Education  2022 [employer].

## 2021-10-31 ENCOUNTER — Encounter: Payer: Self-pay | Admitting: Urology

## 2021-11-01 ENCOUNTER — Encounter (HOSPITAL_COMMUNITY)
Admission: RE | Admit: 2021-11-01 | Discharge: 2021-11-01 | Disposition: A | Discharge status: Home / Self Care | Payer: Medicare Other | Source: Ambulatory Visit | Attending: Urology | Admitting: Urology

## 2021-11-02 ENCOUNTER — Other Ambulatory Visit: Payer: Self-pay | Admitting: Family Medicine

## 2021-11-02 ENCOUNTER — Encounter (HOSPITAL_COMMUNITY): Payer: Medicare Other

## 2021-11-02 NOTE — Telephone Encounter (Signed)
LOV 10/10/21 Last refill 10/07/21, #90, 0 refills  Please review, thanks!

## 2021-11-03 ENCOUNTER — Encounter (HOSPITAL_COMMUNITY): Payer: Self-pay | Admitting: Urology

## 2021-11-03 ENCOUNTER — Ambulatory Visit (HOSPITAL_BASED_OUTPATIENT_CLINIC_OR_DEPARTMENT_OTHER): Payer: Medicare Other | Admitting: Certified Registered"

## 2021-11-03 ENCOUNTER — Encounter (HOSPITAL_COMMUNITY)
Admission: RE | Disposition: A | Discharge status: Home / Self Care | Payer: Self-pay | Source: Home / Self Care | Attending: Urology

## 2021-11-03 ENCOUNTER — Ambulatory Visit (HOSPITAL_COMMUNITY)
Admission: RE | Admit: 2021-11-03 | Discharge: 2021-11-03 | Disposition: A | Discharge status: Home / Self Care | Payer: Medicare Other | Attending: Urology | Admitting: Urology

## 2021-11-03 ENCOUNTER — Ambulatory Visit (HOSPITAL_COMMUNITY): Payer: Medicare Other | Admitting: Certified Registered"

## 2021-11-03 DIAGNOSIS — I10 Essential (primary) hypertension: Secondary | ICD-10-CM

## 2021-11-03 DIAGNOSIS — Z87891 Personal history of nicotine dependence: Secondary | ICD-10-CM | POA: Insufficient documentation

## 2021-11-03 DIAGNOSIS — F319 Bipolar disorder, unspecified: Secondary | ICD-10-CM | POA: Diagnosis not present

## 2021-11-03 DIAGNOSIS — F419 Anxiety disorder, unspecified: Secondary | ICD-10-CM | POA: Diagnosis not present

## 2021-11-03 DIAGNOSIS — F418 Other specified anxiety disorders: Secondary | ICD-10-CM | POA: Diagnosis not present

## 2021-11-03 DIAGNOSIS — N329 Bladder disorder, unspecified: Secondary | ICD-10-CM | POA: Diagnosis not present

## 2021-11-03 DIAGNOSIS — N210 Calculus in bladder: Secondary | ICD-10-CM

## 2021-11-03 DIAGNOSIS — K219 Gastro-esophageal reflux disease without esophagitis: Secondary | ICD-10-CM | POA: Insufficient documentation

## 2021-11-03 DIAGNOSIS — N3289 Other specified disorders of bladder: Secondary | ICD-10-CM | POA: Diagnosis not present

## 2021-11-03 HISTORY — PX: CYSTOSCOPY WITH FULGERATION: SHX6638

## 2021-11-03 SURGERY — CYSTOSCOPY, WITH BLADDER FULGURATION
Anesthesia: General | Site: Bladder

## 2021-11-03 MED ORDER — FENTANYL CITRATE (PF) 100 MCG/2ML IJ SOLN
INTRAMUSCULAR | Status: DC | PRN
  Administered 2021-11-03: 50 ug via INTRAVENOUS

## 2021-11-03 MED ORDER — FENTANYL CITRATE (PF) 100 MCG/2ML IJ SOLN
INTRAMUSCULAR | Status: AC
  Filled 2021-11-03: qty 2

## 2021-11-03 MED ORDER — ONDANSETRON HCL 4 MG PO TABS: 4.0000 mg | ORAL_TABLET | Freq: Every day | ORAL | 1 refills | Status: AC | PRN

## 2021-11-03 MED ORDER — STERILE WATER FOR IRRIGATION IR SOLN
Status: DC | PRN
  Administered 2021-11-03: 500 mL

## 2021-11-03 MED ORDER — DEXAMETHASONE SODIUM PHOSPHATE 10 MG/ML IJ SOLN
INTRAMUSCULAR | Status: DC | PRN
  Administered 2021-11-03: 5 mg via INTRAVENOUS

## 2021-11-03 MED ORDER — PROPOFOL 10 MG/ML IV BOLUS
INTRAVENOUS | Status: DC | PRN
  Administered 2021-11-03: 20 mg via INTRAVENOUS
  Administered 2021-11-03: 120 mg via INTRAVENOUS

## 2021-11-03 MED ORDER — HYDROCODONE-ACETAMINOPHEN 5-325 MG PO TABS: 1.0000 | ORAL_TABLET | Freq: Three times a day (TID) | ORAL | 0 refills | Status: DC | PRN

## 2021-11-03 MED ORDER — ROCURONIUM BROMIDE 10 MG/ML (PF) SYRINGE
PREFILLED_SYRINGE | INTRAVENOUS | Status: DC | PRN
  Administered 2021-11-03: 50 mg via INTRAVENOUS

## 2021-11-03 MED ORDER — LIDOCAINE 2% (20 MG/ML) 5 ML SYRINGE
INTRAMUSCULAR | Status: DC | PRN
  Administered 2021-11-03: 50 mg via INTRAVENOUS

## 2021-11-03 MED ORDER — CEFAZOLIN SODIUM-DEXTROSE 2-4 GM/100ML-% IV SOLN
INTRAVENOUS | Status: AC
  Filled 2021-11-03: qty 100

## 2021-11-03 MED ORDER — ONDANSETRON HCL 4 MG/2ML IJ SOLN
INTRAMUSCULAR | Status: DC | PRN
  Administered 2021-11-03: 4 mg via INTRAVENOUS

## 2021-11-03 MED ORDER — PHENYLEPHRINE 40 MCG/ML (10ML) SYRINGE FOR IV PUSH (FOR BLOOD PRESSURE SUPPORT)
PREFILLED_SYRINGE | INTRAVENOUS | Status: AC
  Filled 2021-11-03: qty 10

## 2021-11-03 MED ORDER — ONDANSETRON HCL 4 MG/2ML IJ SOLN: 4.0000 mg | Freq: Once | INTRAMUSCULAR | Status: DC | PRN

## 2021-11-03 MED ORDER — SUGAMMADEX SODIUM 200 MG/2ML IV SOLN
INTRAVENOUS | Status: DC | PRN
  Administered 2021-11-03: 200 mg via INTRAVENOUS

## 2021-11-03 MED ORDER — SODIUM CHLORIDE 0.9 % IR SOLN
Status: DC | PRN
  Administered 2021-11-03: 3000 mL

## 2021-11-03 MED ORDER — DEXAMETHASONE SODIUM PHOSPHATE 10 MG/ML IJ SOLN
INTRAMUSCULAR | Status: AC
  Filled 2021-11-03: qty 1

## 2021-11-03 MED ORDER — LIDOCAINE HCL (PF) 2 % IJ SOLN
INTRAMUSCULAR | Status: AC
  Filled 2021-11-03: qty 5

## 2021-11-03 MED ORDER — ONDANSETRON HCL 4 MG/2ML IJ SOLN
INTRAMUSCULAR | Status: AC
  Filled 2021-11-03: qty 2

## 2021-11-03 MED ORDER — LACTATED RINGERS IV SOLN
INTRAVENOUS | Status: DC | PRN
Start: 2021-11-03 — End: 2021-11-03

## 2021-11-03 MED ORDER — FENTANYL CITRATE PF 50 MCG/ML IJ SOSY: 25.0000 ug | PREFILLED_SYRINGE | INTRAMUSCULAR | Status: DC | PRN

## 2021-11-03 SURGICAL SUPPLY — 25 items
BAG DRAIN URO TABLE W/ADPT NS (BAG) ×3 IMPLANT
BAG DRN 8 ADPR NS SKTRN CSTL (BAG) ×2
BAG DRN RND TRDRP ANRFLXCHMBR (UROLOGICAL SUPPLIES) ×2
BAG HAMPER (MISCELLANEOUS) ×3 IMPLANT
BAG URINE DRAIN 2000ML AR STRL (UROLOGICAL SUPPLIES) ×3 IMPLANT
CATH FOLEY 3WAY 30CC 22F (CATHETERS) IMPLANT
CATH FOLEY LATEX FREE 22FR (CATHETERS)
CATH FOLEY LF 22FR (CATHETERS) IMPLANT
CLOTH BEACON ORANGE TIMEOUT ST (SAFETY) ×3 IMPLANT
ELECT LOOP 22F BIPOLAR SML (ELECTROSURGICAL) ×3
ELECTRODE LOOP 22F BIPOLAR SML (ELECTROSURGICAL) ×2 IMPLANT
GLOVE SURG POLYISO LF SZ8 (GLOVE) ×3 IMPLANT
GLOVE SURG UNDER POLY LF SZ7 (GLOVE) ×6 IMPLANT
GOWN STRL REUS W/TWL LRG LVL3 (GOWN DISPOSABLE) ×6 IMPLANT
GOWN STRL REUS W/TWL XL LVL3 (GOWN DISPOSABLE) ×3 IMPLANT
IV NS IRRIG 3000ML ARTHROMATIC (IV SOLUTION) ×4 IMPLANT
KIT TURNOVER CYSTO (KITS) ×3 IMPLANT
MANIFOLD NEPTUNE II (INSTRUMENTS) ×3 IMPLANT
PACK CYSTO (CUSTOM PROCEDURE TRAY) ×3 IMPLANT
PAD ARMBOARD 7.5X6 YLW CONV (MISCELLANEOUS) ×3 IMPLANT
PLUG CATH AND CAP STER (CATHETERS) IMPLANT
TOWEL NATURAL 4PK STERILE (DISPOSABLE) ×3 IMPLANT
TOWEL OR 17X26 4PK STRL BLUE (TOWEL DISPOSABLE) ×3 IMPLANT
WATER STERILE IRR 3000ML UROMA (IV SOLUTION) ×3 IMPLANT
WATER STERILE IRR 500ML POUR (IV SOLUTION) ×3 IMPLANT

## 2021-11-03 NOTE — OR Nursing (Signed)
Patient was drinking water this morning and moved to be the second case .  Dr. Alyson Ingles aware.

## 2021-11-03 NOTE — Transfer of Care (Signed)
Immediate Anesthesia Transfer of Care Note  Patient: Abigail Wagner  Procedure(s) Performed: [place] (Bladder)  Patient Location: PACU  Anesthesia Type:General  Level of Consciousness: awake, alert , oriented, patient cooperative and pateint uncooperative  Airway & Oxygen Therapy: Patient Spontanous Breathing and Patient connected to nasal cannula oxygen  Post-op Assessment: Report given to RN, Post -op Vital signs reviewed and stable and Patient moving all extremities  Post vital signs: Reviewed and stable  Last Vitals:  Vitals Value Taken Time  BP 162/71 11/03/21 1039  Temp    Pulse 77 11/03/21 1041  Resp 15 11/03/21 1041  SpO2 97 % 11/03/21 1041  Vitals shown include unvalidated device data.  Last Pain:  Vitals:   11/03/21 0753  TempSrc: Oral  PainSc:       Patients Stated Pain Goal: 6 (16/10/96 0454)  Complications: No notable events documented.

## 2021-11-03 NOTE — Anesthesia Procedure Notes (Signed)
Procedure Name: Intubation Date/Time: 11/03/2021 10:10 AM Performed by: Myna Bright, CRNA Pre-anesthesia Checklist: Patient identified, Emergency Drugs available, Suction available and Patient being monitored Patient Re-evaluated:Patient Re-evaluated prior to induction Oxygen Delivery Method: Circle system utilized Preoxygenation: Pre-oxygenation with 100% oxygen Induction Type: IV induction Ventilation: Mask ventilation without difficulty Laryngoscope Size: Mac and 3 Grade View: Grade I Tube type: Oral Tube size: 7.0 mm Number of attempts: 1 Airway Equipment and Method: Stylet Placement Confirmation: ETT inserted through vocal cords under direct vision, positive ETCO2 and breath sounds checked- equal and bilateral Secured at: 21 cm Tube secured with: Tape Dental Injury: Teeth and Oropharynx as per pre-operative assessment

## 2021-11-03 NOTE — Anesthesia Postprocedure Evaluation (Signed)
Anesthesia Post Note  Patient: Abigail Wagner  Procedure(s) Performed: CYSTOSCOPY WITH FULGERATION (Bladder)  Patient location during evaluation: Phase II Anesthesia Type: General Level of consciousness: awake Pain management: pain level controlled Vital Signs Assessment: post-procedure vital signs reviewed and stable Respiratory status: spontaneous breathing and respiratory function stable Cardiovascular status: blood pressure returned to baseline and stable Postop Assessment: no headache and no apparent nausea or vomiting Anesthetic complications: no Comments: Late entry   No notable events documented.   Last Vitals:  Vitals:   11/03/21 1100 11/03/21 1107  BP: 125/60 (!) 117/53  Pulse: 73 70  Resp: 13 17  Temp:  36.6 C  SpO2: 100% 97%    Last Pain:  Vitals:   11/03/21 1107  TempSrc: Oral  PainSc: [name]

## 2021-11-03 NOTE — Telephone Encounter (Signed)
err

## 2021-11-03 NOTE — Anesthesia Preprocedure Evaluation (Signed)
Anesthesia Evaluation  Patient identified by MRN, date of birth, ID band Patient awake    Reviewed: Allergy & Precautions, H&P , NPO status , Patient's Chart, lab work & pertinent test results, reviewed documented beta blocker date and time   Airway Mallampati: II  TM Distance: >3 FB Neck ROM: full    Dental no notable dental hx.    Pulmonary neg pulmonary ROS, former smoker,    Pulmonary exam normal breath sounds clear to auscultation       Cardiovascular Exercise Tolerance: Good hypertension, negative cardio ROS   Rhythm:regular Rate:Normal     Neuro/Psych  Headaches, PSYCHIATRIC DISORDERS Anxiety Depression Bipolar Disorder    GI/Hepatic Neg liver ROS, GERD  Medicated,  Endo/Other  negative endocrine ROS  Renal/GU Renal disease  negative genitourinary   Musculoskeletal   Abdominal   Peds  Hematology  (+) Blood dyscrasia, anemia ,   Anesthesia Other Findings   Reproductive/Obstetrics negative OB ROS                             Anesthesia Physical Anesthesia Plan  ASA: 3  Anesthesia Plan: General and General LMA   Post-op Pain Management:    Induction:   PONV Risk Score and Plan: Ondansetron  Airway Management Planned:   Additional Equipment:   Intra-op Plan:   Post-operative Plan:   Informed Consent: I have reviewed the patients History and Physical, chart, labs and discussed the procedure including the risks, benefits and alternatives for the proposed anesthesia with the patient or authorized representative who has indicated his/her understanding and acceptance.     Dental Advisory Given  Plan Discussed with: CRNA  Anesthesia Plan Comments:         Anesthesia Quick Evaluation

## 2021-11-03 NOTE — Op Note (Signed)
.  Preoperative diagnosis: bladder lesion  Postoperative diagnosis: bladder lesion, bladder calculus  Procedure: 1 cystoscopy 2. Cystolithalopaxy for a stone less than 2.5cm 3. Fulgeration of bladder lesions  Attending: Rosie Fate  Anesthesia: General  Estimated blood loss: Minimal  Drains: none  Specimens: 1. Bladder calculus  Antibiotics: ance  Findings: 30mm bladder calculus. 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  Ureteral orifices in normal anatomic location.   Indications: Patient is a 71 year old female with a history of bladder lesions and gross hematuria.  After discussing treatment options, they decided proceed with transurethral resection of a bladder tumor.  Procedure in detail: The patient was brought to the operating room and a brief timeout was done to ensure correct patient, correct procedure, correct site.  General anesthesia was administered patient was placed in dorsal lithotomy position.  Their genitalia was then prepped and draped in usual sterile fashion.  A rigid 59 French cystoscope was passed in the urethra and the bladder.  Bladder was inspected and we noted a 73mm bladder calculus and four 4-18mm vascular lesions int he bladder.  Using a grasper the bladder calculus was removed. We then removed the cystoscope and placed a resectoscope into the bladder. Using the bipolar resectoscope we cauterized the bladder lesions. Hemostasis was then obtained with electrocautery. We then re-inspected the bladder and found no residula bleeding.  the bladder was then drained and this concluded the procedure which was well tolerated by patient.  Complications: None  Condition: Stable, extubated, transferred to PACU  Plan: Patient is to be discharged home and followup in 7 days.

## 2021-11-03 NOTE — Interval H&P Note (Signed)
History and Physical Interval Note:  11/03/2021 7:41 AM  Abigail Wagner Abigail Wagner  has presented today for surgery, with the diagnosis of Bladder tumor.  The various methods of treatment have been discussed with the patient and family. After consideration of risks, benefits and other options for treatment, the patient has consented to  Procedure(s): CYSTOSCOPY (N/A) TRANSURETHRAL RESECTION OF BLADDER TUMOR (TURBT) (N/A) as a surgical intervention.  The patient's history has been reviewed, patient examined, no change in status, stable for surgery.  I have reviewed the patient's chart and labs.  Questions were answered to the patient's satisfaction.     Nicolette Bang

## 2021-11-04 ENCOUNTER — Other Ambulatory Visit: Payer: Self-pay | Admitting: Family Medicine

## 2021-11-04 ENCOUNTER — Encounter: Payer: Self-pay | Admitting: Family Medicine

## 2021-11-04 MED ORDER — HYDROCODONE-ACETAMINOPHEN 7.5-325 MG PO TABS: 1.0000 | ORAL_TABLET | Freq: Four times a day (QID) | ORAL | 0 refills | Status: DC | PRN

## 2021-11-04 NOTE — Telephone Encounter (Signed)
Per [facility] none of their locations within 30 miles have [place]. They do have 7.5/325 available.  Please advise, thanks!

## 2021-11-07 ENCOUNTER — Encounter (HOSPITAL_COMMUNITY): Payer: Self-pay | Admitting: Urology

## 2021-11-09 ENCOUNTER — Ambulatory Visit: Payer: Medicare Other | Admitting: Urology

## 2021-11-09 ENCOUNTER — Other Ambulatory Visit: Payer: Self-pay

## 2021-11-09 ENCOUNTER — Encounter: Payer: Self-pay | Admitting: Urology

## 2021-11-09 VITALS — BP 113/52 | HR 90

## 2021-11-09 DIAGNOSIS — N200 Calculus of kidney: Secondary | ICD-10-CM

## 2021-11-09 DIAGNOSIS — R310 Gross hematuria: Secondary | ICD-10-CM | POA: Diagnosis not present

## 2021-11-09 LAB — CALCULI, WITH PHOTOGRAPH (CLINICAL LAB)
Calcium Oxalate Dihydrate: 70 %
Calcium Oxalate Monohydrate: 30 %
Size Calculi: <1 mm
Weight Calculi: <1 mg

## 2021-11-09 NOTE — Progress Notes (Signed)
11/09/2021 9:45 AM   Abigail Wagner Abigail Wagner Dec 19, 1950 [id number]  Referring provider: Susy Frizzle, MD [address] [place],  [place] [address]  Followup bladder biopsy   HPI: Abigail Wagner is a 71yo here for followup after bladder biopsy. Voiding trial passed today. Pathology from bladder biopsy was benign. She denies any pelvic pain. No gross hematuria. No other complaints today   PMH: Past Medical History:  Diagnosis Date   Anal carcinoma ([place])    Chemo and radiation   Anemia    Anxiety    Dr. Harrington Wagner at [place] (psychiatrist).     Carotid artery disease (HCC)    DDD (degenerative disc disease), lumbosacral    GERD (gastroesophageal reflux disease)    Headache(784.0)    History of adenomatous polyp of colon 10/07/2015   Tubular adenoma high grade dysplasia   History of herpes genitalis    History of kidney stones    History of suicide attempt 2010   HTN (hypertension)    Hyperlipidemia    IBS (irritable bowel syndrome)    Major depression    OA (osteoarthritis)    Ovarian cancer ([place])    Stage IIIB papillary ovarian carcinoma  s/p omentectomy and BSO & chemotherapy (completed 12-07-2014)   Prolapse of vaginal vault after hysterectomy 07/20/2015   Rectovaginal fistula 08/05/2015   Seasonal allergies    Sigmoid diverticulosis    Smokers' cough [facility])     Surgical History: Past Surgical History:  Procedure Laterality Date   BIOPSY  10/12/2020   Procedure: BIOPSY;  Surgeon: Harvel Quale, MD;  [facility];  Service: Gastroenterology;;   BREAST ENHANCEMENT [place]   COLONOSCOPY N/A 10/07/2015   Procedure: COLONOSCOPY;  Surgeon: Rogene Houston, MD;  [facility];  Service: Endoscopy;  Laterality: N/A;  7:30   COLONOSCOPY WITH PROPOFOL N/A 10/12/2020   Procedure: COLONOSCOPY WITH PROPOFOL;  Surgeon: Harvel Quale, MD;  [facility];  Service: Gastroenterology;  Laterality: N/A;   9:00   CYSTOSCOPY WITH FULGERATION N/A 11/03/2021   Procedure: CYSTOSCOPY WITH FULGERATION;  Surgeon: Cleon Gustin, MD;  [facility];  Service: Urology;  Laterality: N/A;   ESOPHAGOGASTRODUODENOSCOPY (EGD) WITH PROPOFOL N/A 10/12/2020   Procedure: ESOPHAGOGASTRODUODENOSCOPY (EGD) WITH PROPOFOL;  Surgeon: Harvel Quale, MD;  [facility];  Service: Gastroenterology;  Laterality: N/A;   EXPLORATORY LAPAROTOMY/ OMENTECTOMY/  BILATERAL SALPINGOOPHORECTOMY/  PORT-A-CATH PLACEMENT  07-03-2014   [place]   KNEE ARTHROSCOPY Left 09-22-2004   LAPAROSCOPY N/A 06/02/2014   Procedure: DIAGNOSTIC LAPAROSCOPY, OMENTAL BIOPSY, RIGHT OVARY BIOPSY, LYSIS OF ADHESIONS;  Surgeon: Fanny Skates, MD;  Location: [place] [facility];  Service: General;  Laterality: N/A;   MUCOSAL ADVANCEMENT FLAP N/A 06/15/2016   Procedure: EXCISION RECTOVAGINAOL FISTULA WITH MUCOSAL ADVANCEMENT FLAP;  Surgeon: Leighton Ruff, MD;  Location: [place];  Service: General;  Laterality: N/A;   [place] N/A 09/09/2015   Procedure: PLACEMENT OF SETON;  Surgeon: Leighton Ruff, MD;  Location: [facility];  Service: General;  Laterality: N/A;   PORT-A-CATH REMOVAL Right 08/17/2014   Procedure: REMOVAL INTRAPERITONEAL CHEMO PORT;  Surgeon: Fanny Skates, MD;  Location: [place];  Service: General;  Laterality: Right;   PORTACATH PLACEMENT Right 08/17/2014   Procedure:  PLACE NEW PORT A CATH;  Surgeon: Fanny Skates, MD;  Location: [place];  Service: General;  Laterality: Right;  RECTAL BIOPSY N/A 09/09/2015   Procedure: BIOPSY OF RECTOVAGINAL MASS;  Surgeon: Leighton Ruff, MD;  Location: [place];  Service: General;  Laterality: N/A;   TUBAL LIGATION  YRS AGO   VAGINAL HYSTERECTOMY  1981   fibroids    Home Medications:  Allergies as of 11/09/2021       Reactions   Morphine And Related    Delirium with sbo         Medication List        Accurate as of November 09, 2021  9:45 AM. If you have any questions, ask your nurse or doctor.          amiodarone 200 MG tablet Commonly known as: PACERONE TAKE 1 TABLET BY MOUTH EVERY DAY   apixaban 5 MG Tabs tablet Commonly known as: ELIQUIS Take 1 tablet (5 mg total) by mouth 2 (two) times daily.   b complex vitamins tablet Take 1 tablet by mouth daily.   clotrimazole 1 % cream Commonly known as: LOTRIMIN Apply 1 application topically 2 (two) times daily as needed (itching around ankles).   Co Q 10 100 MG Caps Take 100 mg by mouth daily.   divalproex 500 MG 24 hr tablet Commonly known as: Depakote ER Take 1 tablet (500 mg total) by mouth daily.   escitalopram 20 MG tablet Commonly known as: LEXAPRO Take 20 mg by mouth daily.   furosemide 40 MG tablet Commonly known as: LASIX Take 40 mg by mouth daily as needed for edema.   HAIR SKIN & NAILS ADVANCED PO Take 3 tablets by mouth daily.   HYDROcodone-acetaminophen 7.5-325 MG tablet Commonly known as: NORCO Take 1 tablet by mouth every 6 (six) hours as needed for moderate pain.   Krill Oil 350 MG Caps Take 350 mg by mouth daily.   LORazepam 0.5 MG tablet Commonly known as: ATIVAN TAKE 1 TABLET (0.5 MG TOTAL) BY MOUTH 3 (THREE) TIMES DAILY AS NEEDED FOR ANXIETY.   multivitamin tablet Take 1 tablet by mouth daily.   omeprazole 40 MG capsule Commonly known as: PRILOSEC Take 1 capsule (40 mg total) by mouth daily.   ondansetron 4 MG tablet Commonly known as: ZOFRAN TAKE 1 TABLET BY MOUTH EVERY 8 HOURS AS NEEDED FOR NAUSEA AND VOMITING   ondansetron 4 MG tablet Commonly known as: Zofran Take 1 tablet (4 mg total) by mouth daily as needed for nausea or vomiting.   polyethylene glycol 17 g packet Commonly known as: MIRALAX / GLYCOLAX Take 17 g by mouth daily.   rosuvastatin 20 MG tablet Commonly known as: CRESTOR Take 1 tablet (20 mg total) by mouth daily.   THERAWORX  RELIEF EX Apply 1 application topically daily as needed.   Vitamin D-3 125 MCG (5000 UT) Tabs Take 5,000 Units by mouth daily.        Allergies:  Allergies  Allergen Reactions   Morphine And Related     Delirium with sbo    Family History: Family History  Problem Relation Age of Onset   Bipolar disorder Mother    Anxiety disorder Mother    Dementia Mother    Depression Mother    Mental illness Mother    Vision loss Mother    Alzheimer's disease Mother    Alcohol abuse Paternal Uncle    Bipolar disorder Maternal Grandmother    Dementia Maternal Grandmother    Alzheimer's disease Maternal Grandmother    Alcohol abuse Paternal Uncle    Stroke Brother  Deep vein thrombosis Son    Heart disease Father    Hyperlipidemia Father    Hypertension Father    Stroke Father    Vision loss Father    Atrial fibrillation Sister    Heart disease Brother    Heart disease Brother    ADD / ADHD Neg Hx    Drug abuse Neg Hx    OCD Neg Hx    Paranoid behavior Neg Hx    Schizophrenia Neg Hx    Seizures Neg Hx    Sexual abuse Neg Hx    Physical abuse Neg Hx     Social History:  reports that she quit smoking about 4 months ago. Her smoking use included cigarettes. She has a 21.50 pack-year smoking history. She has never used smokeless tobacco. She reports that she does not drink alcohol and does not use drugs.  ROS: All other review of systems were reviewed and are negative except what is noted above in HPI  Physical Exam: BP (!) 113/52    Pulse 90   Constitutional:  Alert and oriented, No acute distress. HEENT: [place] AT, moist mucus membranes.  Trachea midline, no masses. Cardiovascular: No clubbing, cyanosis, or edema. Respiratory: Normal respiratory effort, no increased work of breathing. GI: Abdomen is soft, nontender, nondistended, no abdominal masses GU: No CVA tenderness.  Lymph: No cervical or inguinal lymphadenopathy. Skin: No rashes, bruises or suspicious  lesions. Neurologic: Grossly intact, no focal deficits, moving all 4 extremities. Psychiatric: Normal mood and affect.  Laboratory Data: Lab Results  Component Value Date   WBC 5.5 10/06/2021   HGB 12.2 10/06/2021   HCT 35.9 10/06/2021   MCV 97.8 10/06/2021   PLT 263 10/06/2021    Lab Results  Component Value Date   CREATININE 0.68 10/06/2021    No results found for: PSA  No results found for: TESTOSTERONE  No results found for: HGBA1C  Urinalysis    Component Value Date/Time   COLORURINE DARK YELLOW 10/13/2021 1206   APPEARANCEUR Clear 10/26/2021 1027   LABSPEC 1.020 10/13/2021 1206   PHURINE 6.0 10/13/2021 1206   GLUCOSEU Negative 10/26/2021 1027   HGBUR NEGATIVE 10/13/2021 1206   BILIRUBINUR Negative 10/26/2021 1027   KETONESUR NEGATIVE 10/13/2021 1206   PROTEINUR 1+ (A) 10/26/2021 1027   PROTEINUR TRACE (A) 10/13/2021 1206   UROBILINOGEN 0.2 05/27/2014 1428   NITRITE Negative 10/26/2021 1027   NITRITE NEGATIVE 10/13/2021 1206   LEUKOCYTESUR Trace (A) 10/26/2021 1027   LEUKOCYTESUR NEGATIVE 10/13/2021 1206    Lab Results  Component Value Date   LABMICR See below: 10/26/2021   WBCUA 0-5 10/26/2021   RBCUA 3-10 (A) 08/03/2015   LABEPIT 0-10 10/26/2021   MUCUS Present 10/21/2021   BACTERIA Few (A) 10/26/2021    Pertinent Imaging:  No results found for this or any previous visit.  No results found for this or any previous visit.  No results found for this or any previous visit.  No results found for this or any previous visit.  No results found for this or any previous visit.  No results found for this or any previous visit.  No results found for this or any previous visit.  No results found for this or any previous visit.   Assessment & Plan:    1. Gross hematuria -RTc 3 months with UA and cystoscopy - Urinalysis, Routine w reflex microscopic  2. Kidney stones -Dietary handout given    Return in about 3 months (around 02/06/2022) for  cystoscopy.  Nicolette Bang, MD  [facility] [place]

## 2021-11-11 ENCOUNTER — Encounter (HOSPITAL_COMMUNITY): Payer: Medicare Other

## 2021-11-15 ENCOUNTER — Encounter (HOSPITAL_COMMUNITY): Payer: Medicare Other

## 2021-11-18 ENCOUNTER — Other Ambulatory Visit: Payer: Self-pay

## 2021-11-18 ENCOUNTER — Inpatient Hospital Stay (HOSPITAL_COMMUNITY): Payer: Medicare Other | Attending: Hematology

## 2021-11-18 DIAGNOSIS — C210 Malignant neoplasm of anus, unspecified: Secondary | ICD-10-CM | POA: Insufficient documentation

## 2021-11-18 DIAGNOSIS — C569 Malignant neoplasm of unspecified ovary: Secondary | ICD-10-CM | POA: Insufficient documentation

## 2021-11-18 DIAGNOSIS — Z452 Encounter for adjustment and management of vascular access device: Secondary | ICD-10-CM | POA: Insufficient documentation

## 2021-11-18 MED ORDER — SODIUM CHLORIDE 0.9% FLUSH
10.0000 mL | Freq: Once | INTRAVENOUS | Status: AC
Start: 2021-11-18 — End: 2021-11-18
  Administered 2021-11-18: 10 mL via INTRAVENOUS

## 2021-11-18 MED ORDER — HEPARIN SOD (PORK) LOCK FLUSH 100 UNIT/ML IV SOLN
500.0000 [IU] | Freq: Once | INTRAVENOUS | Status: AC
  Administered 2021-11-18: 500 [IU] via INTRAVENOUS

## 2021-11-18 NOTE — Progress Notes (Signed)
Patients port flushed without difficulty.  Good blood return noted with no bruising or swelling noted at site.  Band aid applied.  VSS with discharge and left in satisfactory condition with no s/s of distress noted.   

## 2021-11-18 NOTE — Patient Instructions (Signed)
[facility]  Discharge Instructions: ?Thank you for choosing [facility] to provide your oncology and hematology care.  ?If you have a lab appointment with the Cancer Center, please come in thru the Main Entrance and check in at the main information desk. ? ?Wear comfortable clothing and clothing appropriate for easy access to any Portacath or PICC line.  ? ?We strive to give you quality time with your provider. You may need to reschedule your appointment if you arrive late (15 or more minutes).  Arriving late affects you and other patients whose appointments are after yours.  Also, if you miss three or more appointments without notifying the office, you may be dismissed from the clinic at the provider?s discretion.    ?  ?For prescription refill requests, have your pharmacy contact our office and allow 72 hours for refills to be completed.   ? ?Today you received the following chemotherapy and/or immunotherapy agents Port flush    ?  ?To help prevent nausea and vomiting after your treatment, we encourage you to take your nausea medication as directed. ? ?BELOW ARE SYMPTOMS THAT SHOULD BE REPORTED IMMEDIATELY: ?*FEVER GREATER THAN 100.4 F (38 ?C) OR HIGHER ?*CHILLS OR SWEATING ?*NAUSEA AND VOMITING THAT IS NOT CONTROLLED WITH YOUR NAUSEA MEDICATION ?*UNUSUAL SHORTNESS OF BREATH ?*UNUSUAL BRUISING OR BLEEDING ?*URINARY PROBLEMS (pain or burning when urinating, or frequent urination) ?*BOWEL PROBLEMS (unusual diarrhea, constipation, pain near the anus) ?TENDERNESS IN MOUTH AND THROAT WITH OR WITHOUT PRESENCE OF ULCERS (sore throat, sores in mouth, or a toothache) ?UNUSUAL RASH, SWELLING OR PAIN  ?UNUSUAL VAGINAL DISCHARGE OR ITCHING  ? ?Items with * indicate a potential emergency and should be followed up as soon as possible or go to the Emergency Department if any problems should occur. ? ?Please show the CHEMOTHERAPY ALERT CARD or IMMUNOTHERAPY ALERT CARD at check-in to the Emergency  Department and triage nurse. ? ?Should you have questions after your visit or need to cancel or reschedule your appointment, please contact [facility] [phone]  and follow the prompts.  Office hours are 8:00 a.m. to 4:30 p.m. Monday - Friday. Please note that voicemails left after 4:00 p.m. may not be returned until the following business day.  We are closed weekends and major holidays. You have access to a nurse at all times for urgent questions. Please call the main number to the clinic [phone] and follow the prompts. ? ?For any non-urgent questions, you may also contact your provider using MyChart. We now offer e-Visits for anyone 18 and older to request care online for non-urgent symptoms. For details visit [email or web address]. ?  ?Also download the MyChart app! Go to the app store, search "MyChart", open the app, select [facility], and log in with your MyChart username and password. ? ?Due to Covid, a mask is required upon entering the hospital/clinic. If you do not have a mask, one will be given to you upon arrival. For doctor visits, patients may have 1 support person aged 18 or older with them. For treatment visits, patients cannot have anyone with them due to current Covid guidelines and our immunocompromised population.  ?

## 2021-11-19 ENCOUNTER — Encounter: Payer: Self-pay | Admitting: Family Medicine

## 2021-11-21 ENCOUNTER — Other Ambulatory Visit: Payer: Self-pay | Admitting: Family Medicine

## 2021-11-21 MED ORDER — HYDROCODONE-ACETAMINOPHEN 7.5-325 MG PO TABS: 1.0000 | ORAL_TABLET | Freq: Four times a day (QID) | ORAL | 0 refills | Status: DC | PRN

## 2021-11-29 ENCOUNTER — Encounter: Payer: Self-pay | Admitting: Cardiology

## 2021-11-29 ENCOUNTER — Other Ambulatory Visit: Payer: Self-pay

## 2021-11-29 ENCOUNTER — Ambulatory Visit: Payer: Medicare Other | Admitting: Cardiology

## 2021-11-29 VITALS — BP 142/90 | HR 79 | Ht 65.0 in | Wt 118.8 lb

## 2021-11-29 DIAGNOSIS — I480 Paroxysmal atrial fibrillation: Secondary | ICD-10-CM

## 2021-11-29 DIAGNOSIS — I6523 Occlusion and stenosis of bilateral carotid arteries: Secondary | ICD-10-CM

## 2021-11-29 NOTE — Progress Notes (Signed)
Cardiology Office Note  Date: 11/29/2021   ID: Abigail Wagner, DOB 1950-10-04, MRN [id number]  PCP:  Susy Frizzle, MD  Cardiologist:  None Electrophysiologist:  None   Chief Complaint  Patient presents with   Cardiac follow-up    History of Present Illness: Abigail Wagner is a 71 y.o. female last seen in November 2022 by Ms. Bonnell Public PA-C.  She is here today for a routine follow-up visit with her husband.  She does not report any progressive palpitations, no chest pain or increasing shortness of breath.  CHA2DS2-VASc score is 4.  She continues on Eliquis for stroke prophylaxis.  Also on amiodarone for suppression of atrial fibrillation.  I did review her lab work within the last 6 months.  Echocardiogram from July 2022 revealed LVEF 60 to 65% with moderate diastolic dysfunction, normal left atrial chamber size, and trivial mitral regurgitation.  Past Medical History:  Diagnosis Date   Anal carcinoma ([place])    Chemo and radiation   Anemia    Anxiety    Dr. Harrington Challenger at [facility] (psychiatrist).     Carotid artery disease (HCC)    DDD (degenerative disc disease), lumbosacral    GERD (gastroesophageal reflux disease)    Headache(784.0)    History of adenomatous polyp of colon 10/07/2015   Tubular adenoma high grade dysplasia   History of herpes genitalis    History of kidney stones    History of suicide attempt 2010   HTN (hypertension)    Hyperlipidemia    IBS (irritable bowel syndrome)    Major depression    OA (osteoarthritis)    Ovarian cancer ([place])    Stage IIIB papillary ovarian carcinoma  s/p omentectomy and BSO & chemotherapy (completed 12-07-2014)   Prolapse of vaginal vault after hysterectomy 07/20/2015   Rectovaginal fistula 08/05/2015   Seasonal allergies    Sigmoid diverticulosis    Smokers' cough [facility])     Past Surgical History:  Procedure Laterality Date   BIOPSY  10/12/2020   Procedure: BIOPSY;  Surgeon: Harvel Quale, MD;  [facility];  Service: Gastroenterology;;   BREAST ENHANCEMENT [place]   COLONOSCOPY N/A 10/07/2015   Procedure: COLONOSCOPY;  Surgeon: Rogene Houston, MD;  [facility];  Service: Endoscopy;  Laterality: N/A;  7:30   COLONOSCOPY WITH PROPOFOL N/A 10/12/2020   Procedure: COLONOSCOPY WITH PROPOFOL;  Surgeon: Harvel Quale, MD;  [facility];  Service: Gastroenterology;  Laterality: N/A;  9:00   CYSTOSCOPY WITH FULGERATION N/A 11/03/2021   Procedure: CYSTOSCOPY WITH FULGERATION;  Surgeon: Cleon Gustin, MD;  [facility];  Service: Urology;  Laterality: N/A;   ESOPHAGOGASTRODUODENOSCOPY (EGD) WITH PROPOFOL N/A 10/12/2020   Procedure: ESOPHAGOGASTRODUODENOSCOPY (EGD) WITH PROPOFOL;  Surgeon: Harvel Quale, MD;  [facility];  Service: Gastroenterology;  Laterality: N/A;   EXPLORATORY LAPAROTOMY/ OMENTECTOMY/  BILATERAL SALPINGOOPHORECTOMY/  PORT-A-CATH PLACEMENT  07-03-2014   [place]   KNEE ARTHROSCOPY Left 09-22-2004   LAPAROSCOPY N/A 06/02/2014   Procedure: DIAGNOSTIC LAPAROSCOPY, OMENTAL BIOPSY, RIGHT OVARY BIOPSY, LYSIS OF ADHESIONS;  Surgeon: Fanny Skates, MD;  Location: [place];  Service: General;  Laterality: N/A;   MUCOSAL ADVANCEMENT FLAP N/A 06/15/2016   Procedure: EXCISION RECTOVAGINAOL FISTULA WITH MUCOSAL ADVANCEMENT FLAP;  Surgeon: Leighton Ruff, MD;  Location: [place];  Service: General;  Laterality: N/A;   PLACEMENT OF SETON N/A 09/09/2015   Procedure: PLACEMENT OF  SETON;  Surgeon: Leighton Ruff, MD;  Location: [facility];  Service: General;  Laterality: N/A;   PORT-A-CATH REMOVAL Right 08/17/2014   Procedure: REMOVAL INTRAPERITONEAL CHEMO PORT;  Surgeon: Fanny Skates, MD;  Location: [place];  Service: General;  Laterality: Right;   PORTACATH PLACEMENT Right 08/17/2014   Procedure:  PLACE NEW PORT A CATH;  Surgeon: Fanny Skates, MD;   Location: [place];  Service: General;  Laterality: Right;   RECTAL BIOPSY N/A 09/09/2015   Procedure: BIOPSY OF RECTOVAGINAL MASS;  Surgeon: Leighton Ruff, MD;  Location: [place];  Service: General;  Laterality: N/A;   TUBAL LIGATION  YRS AGO   VAGINAL HYSTERECTOMY  1981   fibroids    Current Outpatient Medications  Medication Sig Dispense Refill   amiodarone (PACERONE) 200 MG tablet TAKE 1 TABLET BY MOUTH EVERY DAY 90 tablet 3   apixaban (ELIQUIS) 5 MG TABS tablet Take 1 tablet (5 mg total) by mouth 2 (two) times daily. 60 tablet 5   b complex vitamins tablet Take 1 tablet by mouth daily.     Cholecalciferol (VITAMIN D-3) 125 MCG (5000 UT) TABS Take 5,000 Units by mouth daily.     clotrimazole (LOTRIMIN) 1 % cream Apply 1 application topically 2 (two) times daily as needed (itching around ankles).     Coenzyme Q10 (CO Q 10) 100 MG CAPS Take 100 mg by mouth daily.     divalproex (DEPAKOTE ER) 500 MG 24 hr tablet Take 1 tablet (500 mg total) by mouth daily. 90 tablet 3   escitalopram (LEXAPRO) 20 MG tablet Take 20 mg by mouth daily.     furosemide (LASIX) 40 MG tablet Take 40 mg by mouth daily as needed for edema.     Homeopathic Products (THERAWORX RELIEF EX) Apply 1 application topically daily as needed.     HYDROcodone-acetaminophen (NORCO) 7.5-325 MG tablet Take 1 tablet by mouth every 6 (six) hours as needed for moderate pain. 30 tablet 0   Krill Oil 350 MG CAPS Take 350 mg by mouth daily.     LORazepam (ATIVAN) 0.5 MG tablet TAKE 1 TABLET (0.5 MG TOTAL) BY MOUTH 3 (THREE) TIMES DAILY AS NEEDED FOR ANXIETY. 90 tablet 2   Multiple Vitamin (MULTIVITAMIN) tablet Take 1 tablet by mouth daily.     Multiple Vitamins-Minerals (HAIR SKIN & NAILS ADVANCED PO) Take 3 tablets by mouth daily.     omeprazole (PRILOSEC) 40 MG capsule Take 1 capsule (40 mg total) by mouth daily. 90 capsule 3   ondansetron (ZOFRAN) 4 MG tablet Take 1 tablet (4 mg total) by mouth  daily as needed for nausea or vomiting. 30 tablet 1   polyethylene glycol (MIRALAX / GLYCOLAX) 17 g packet Take 17 g by mouth daily. 30 each 1   rosuvastatin (CRESTOR) 20 MG tablet Take 1 tablet (20 mg total) by mouth daily. 90 tablet 3   No current facility-administered medications for this visit.   Allergies:  Morphine and related   ROS: Chronic back pain.  Physical Exam: VS:  BP (!) 142/90    Pulse 79    Ht '5\' 5"'$  (1.651 m)    Wt 118 lb 12.8 oz (53.9 kg)    SpO2 96%    BMI 19.77 kg/m , BMI Body mass index is 19.77 kg/m.  Wt Readings from Last 3 Encounters:  11/29/21 118 lb 12.8 oz (53.9 kg)  11/03/21 118 lb (53.5 kg)  10/26/21 117 lb 9.6  oz (53.3 kg)    General: Patient appears comfortable at rest. HEENT: Conjunctiva and lids normal, wearing a mask. Neck: Supple, no elevated JVP or carotid bruits, no thyromegaly. Lungs: Clear to auscultation, nonlabored breathing at rest. Cardiac: Regular rate and rhythm, no S3 or significant systolic murmur, no pericardial rub. Extremities: No pitting edema.  ECG:  An ECG dated 06/30/2021 was personally reviewed today and demonstrated:  Sinus rhythm with PACs and borderline low voltage.  Recent Labwork: 03/07/2021: TSH 1.82 06/03/2021: Brain Natriuretic Peptide 379 06/29/2021: Magnesium 2.0 10/06/2021: ALT 9; AST 19; BUN 21; Creat 0.68; Hemoglobin 12.2; Platelets 263; Potassium 4.6; Sodium 140     Component Value Date/Time   CHOL 140 06/29/2021 0519   TRIG 76 06/29/2021 0519   HDL 51 06/29/2021 0519   CHOLHDL 2.7 06/29/2021 0519   VLDL 15 06/29/2021 0519   LDLCALC 74 06/29/2021 0519   LDLCALC 80 01/10/2021 1017    Other Studies Reviewed Today:  Echocardiogram 06/14/2021:  1. Left ventricular ejection fraction, by estimation, is 60 to 65%. The  left ventricle has normal function. The left ventricle has no regional  wall motion abnormalities. Left ventricular diastolic parameters are  consistent with Grade II diastolic  dysfunction  (pseudonormalization).   2. Right ventricular systolic function is normal. The right ventricular  size is mildly enlarged. There is normal pulmonary artery systolic  pressure. The estimated right ventricular systolic pressure is 01.4 mmHg.   3. The mitral valve is normal in structure. Trivial mitral valve  regurgitation. No evidence of mitral stenosis.   4. Tricuspid valve regurgitation is moderate.   5. The aortic valve is tricuspid. Aortic valve regurgitation is trivial.  Mild to moderate aortic valve sclerosis/calcification is present, without  any evidence of aortic stenosis.   6. The inferior vena cava is normal in size with greater than 50%  respiratory variability, suggesting right atrial pressure of 3 mmHg.   Carotid Dopplers 06/29/2021: IMPRESSION: Color duplex indicates moderate heterogeneous and calcified plaque, with no hemodynamically significant stenosis by duplex criteria in the extracranial cerebrovascular circulation.  Assessment and Plan:  1.  Paroxysmal atrial fibrillation with CHA2DS2-VASc score of 4.  She is on Eliquis for stroke prophylaxis and also low-dose amiodarone for rhythm suppression.  I reviewed her lab work within the last 6 months.  We will plan a follow-up visit in 6 months with CBC, CMET, and TSH.  No change in current regimen.  2.  Asymptomatic carotid artery disease, Dopplers in October 2022 showed moderate heterogeneous and calcified atherosclerosis but no stenosis involving the ICAs bilaterally.  She is on Crestor.  Medication Adjustments/Labs and Tests Ordered: Current medicines are reviewed at length with the patient today.  Concerns regarding medicines are outlined above.   Tests Ordered: Orders Placed This Encounter  Procedures   CBC   Comprehensive metabolic panel   TSH    Medication Changes: No orders of the defined types were placed in this encounter.   Disposition:  Follow up  6 months.  Signed, Satira Sark, MD,  [facility] 11/29/2021 1:18 PM    [place] [facility] [address], [place], [place] [address] Phone: [phone]; Fax: [phone]

## 2021-11-29 NOTE — Patient Instructions (Signed)
Medication Instructions:  ?Your physician recommends that you continue on your current medications as directed. Please refer to the Current Medication list given to you today. ? ? ?Labwork: ?CBC,CMET,TSH in 6 months just before next visit ? ?Testing/Procedures: ?None today ? ?Follow-Up: ?6 months ? ?Any Other Special Instructions Will Be Listed Below (If Applicable). ? ?If you need a refill on your cardiac medications before your next appointment, please call your pharmacy. ? ?

## 2021-12-05 ENCOUNTER — Other Ambulatory Visit: Payer: Self-pay | Admitting: Family Medicine

## 2021-12-06 ENCOUNTER — Encounter: Payer: Self-pay | Admitting: Family Medicine

## 2021-12-06 ENCOUNTER — Other Ambulatory Visit: Payer: Self-pay | Admitting: Family Medicine

## 2021-12-06 MED ORDER — HYDROCODONE-ACETAMINOPHEN 7.5-325 MG PO TABS: 1.0000 | ORAL_TABLET | Freq: Four times a day (QID) | ORAL | 0 refills | Status: DC | PRN

## 2021-12-06 NOTE — Telephone Encounter (Signed)
LOV 10/13/21 ?Last refill 11/21/21, #30, 0 refills ? ?Please review, thanks! ? ?

## 2021-12-19 ENCOUNTER — Other Ambulatory Visit: Payer: Self-pay | Admitting: Family Medicine

## 2021-12-19 ENCOUNTER — Other Ambulatory Visit (HOSPITAL_COMMUNITY): Payer: Self-pay | Admitting: Psychiatry

## 2021-12-19 ENCOUNTER — Encounter: Payer: Self-pay | Admitting: Family Medicine

## 2021-12-19 ENCOUNTER — Encounter (HOSPITAL_COMMUNITY): Payer: Self-pay

## 2021-12-19 MED ORDER — DIVALPROEX SODIUM ER 500 MG PO TB24: 500.0000 mg | ORAL_TABLET | Freq: Every day | ORAL | 3 refills | Status: DC

## 2021-12-19 MED ORDER — LORAZEPAM 0.5 MG PO TABS: 0.5000 mg | ORAL_TABLET | Freq: Three times a day (TID) | ORAL | 2 refills | Status: DC | PRN

## 2021-12-20 MED ORDER — HYDROCODONE-ACETAMINOPHEN 7.5-325 MG PO TABS: 1.0000 | ORAL_TABLET | Freq: Four times a day (QID) | ORAL | 0 refills | Status: DC | PRN

## 2021-12-20 NOTE — Telephone Encounter (Signed)
error 

## 2021-12-20 NOTE — Telephone Encounter (Signed)
LOV 11/29/21 ?Last refill 12/06/21, #30, 0 refills ? ?Please review, thanks!  ?

## 2021-12-21 ENCOUNTER — Other Ambulatory Visit: Payer: Self-pay

## 2021-12-21 ENCOUNTER — Encounter (HOSPITAL_COMMUNITY): Payer: Self-pay | Admitting: Psychiatry

## 2021-12-21 ENCOUNTER — Telehealth (INDEPENDENT_AMBULATORY_CARE_PROVIDER_SITE_OTHER): Payer: Medicare Other | Admitting: Psychiatry

## 2021-12-21 DIAGNOSIS — F331 Major depressive disorder, recurrent, moderate: Secondary | ICD-10-CM

## 2021-12-21 MED ORDER — ESCITALOPRAM OXALATE 20 MG PO TABS: 20.0000 mg | ORAL_TABLET | Freq: Every day | ORAL | 3 refills | Status: DC

## 2021-12-21 NOTE — Progress Notes (Signed)
Virtual Visit via Telephone Note ? ?I connected with Abigail Wagner on 12/21/21 at  9:40 AM EDT by telephone and verified that I am speaking with the correct person using two identifiers. ? ?Location: ?Patient: home ?Provider: office ?  ?I discussed the limitations, risks, security and privacy concerns of performing an evaluation and management service by telephone and the availability of in person appointments. I also discussed with the patient that there may be a patient responsible charge related to this service. The patient expressed understanding and agreed to proceed. ? ? ? ? ?  ?I discussed the assessment and treatment plan with the patient. The patient was provided an opportunity to ask questions and all were answered. The patient agreed with the plan and demonstrated an understanding of the instructions. ?  ?The patient was advised to call back or seek an in-person evaluation if the symptoms worsen or if the condition fails to improve as anticipated. ? ?I provided 20 minutes of non-face-to-face time during this encounter. ? ? ?[name], MD ? ?BH MD/PA/NP OP Progress Note ? ?12/21/2021 9:57 AM ?Abigail Wagner  ?MRN:  [id number] ? ?Chief Complaint:  ?Chief Complaint  ?Patient presents with  ? Depression  ? Anxiety  ? Follow-up  ? ?HPI: This patient is a 71 year old married white female lives with her husband in [place]. She has 3 children and 5 grandchildren. She is retired from [employer]. ?  ?The patient states she's had depression since her 10s. She was hospitalized several times, last time being in 2010 when she took a drug overdose. She was going through a lot of family stress back then. She states that she was [facility] and since then she has never wanted to go back. She's been quite stable despite her low doses of medication. She's also stopped drinking alcohol which is made a big difference ? ?The patient returns for follow-up after about 5 months.  She states overall  she is doing fairly well.  Her cardiologist decreased her Lexapro to 20 mg daily because of possible interference with amiodarone.  She states that she is still doing well in terms of mood.  She sometimes feels overwhelmed and cannot find words but this is mostly when she is around a lot of people and noise.  She does fine at home.  Her most recent brain MRI was normal.  She denies symptoms of depression or anxiety right now.  Did tell her to be careful about combining her pain medicine with the lorazepam.  She is sleeping well.  She denies any manic symptoms such as racing thoughts agitation hyperactivity.  She denies thoughts of self-harm or suicidal ideation.  She states she is eating well and her weight has remained around 118 pounds ?Visit Diagnosis:  ?  ICD-10-CM   ?1. Major depressive disorder, recurrent episode, moderate (HCC)  F33.1   ?  ? ? ?Past Psychiatric History: Past hospitalizations for depression and alcohol abuse, none since 2010 ? ?Past Medical History:  ?Past Medical History:  ?Diagnosis Date  ? Anal carcinoma ([place])   ? Chemo and radiation  ? Anemia   ? Anxiety   ? Dr. Harrington Challenger at [place] (psychiatrist).    ? Carotid artery disease ([place])   ? DDD (degenerative disc disease), lumbosacral   ? GERD (gastroesophageal reflux disease)   ? Headache(784.0)   ? History of adenomatous polyp of colon 10/07/2015  ? Tubular adenoma high grade dysplasia  ? History of herpes genitalis   ?  History of kidney stones   ? History of suicide attempt 2010  ? HTN (hypertension)   ? Hyperlipidemia   ? IBS (irritable bowel syndrome)   ? Major depression   ? OA (osteoarthritis)   ? Ovarian cancer ([place])   ? Stage IIIB papillary ovarian carcinoma  s/p omentectomy and BSO & chemotherapy (completed 12-07-2014)  ? Prolapse of vaginal vault after hysterectomy 07/20/2015  ? Rectovaginal fistula 08/05/2015  ? Seasonal allergies   ? Sigmoid diverticulosis   ? Smokers' cough ([place])   ?  ?Past Surgical History:  ?Procedure Laterality  Date  ? BIOPSY  10/12/2020  ? Procedure: BIOPSY;  Surgeon: Montez Morita, Quillian Quince, MD;  [facility];  Service: Gastroenterology;;  ? [place]  ? [place]  ? COLONOSCOPY N/A 10/07/2015  ? Procedure: COLONOSCOPY;  Surgeon: Rogene Houston, MD;  [facility];  Service: Endoscopy;  Laterality: N/A;  7:30  ? COLONOSCOPY WITH PROPOFOL N/A 10/12/2020  ? Procedure: COLONOSCOPY WITH PROPOFOL;  Surgeon: Harvel Quale, MD;  [facility];  Service: Gastroenterology;  Laterality: N/A;  9:00  ? [place] N/A 11/03/2021  ? Procedure: CYSTOSCOPY WITH FULGERATION;  Surgeon: Cleon Gustin, MD;  Location: [facility];  Service: Urology;  Laterality: N/A;  ? ESOPHAGOGASTRODUODENOSCOPY (EGD) WITH PROPOFOL N/A 10/12/2020  ? Procedure: ESOPHAGOGASTRODUODENOSCOPY (EGD) WITH PROPOFOL;  Surgeon: Harvel Quale, MD;  [facility];  Service: Gastroenterology;  Laterality: N/A;  ? EXPLORATORY LAPAROTOMY/ OMENTECTOMY/  BILATERAL SALPINGOOPHORECTOMY/  PORT-A-CATH PLACEMENT  07-03-2014   [facility]  ? KNEE ARTHROSCOPY Left 09-22-2004  ? LAPAROSCOPY N/A 06/02/2014  ? Procedure: DIAGNOSTIC LAPAROSCOPY, OMENTAL BIOPSY, RIGHT OVARY BIOPSY, LYSIS OF ADHESIONS;  Surgeon: Fanny Skates, MD;  Location: [place];  Service: General;  Laterality: N/A;  ? MUCOSAL ADVANCEMENT FLAP N/A 06/15/2016  ? Procedure: EXCISION RECTOVAGINAOL FISTULA WITH MUCOSAL ADVANCEMENT FLAP;  Surgeon: Leighton Ruff, MD;  Location: [place];  Service: General;  Laterality: N/A;  ? [place] N/A 09/09/2015  ? Procedure: PLACEMENT OF SETON;  Surgeon: Leighton Ruff, MD;  Location: [facility];  Service: General;  Laterality: N/A;  ? PORT-A-CATH REMOVAL Right 08/17/2014  ? Procedure: REMOVAL INTRAPERITONEAL CHEMO PORT;  Surgeon: Fanny Skates, MD;  Location: [place];  Service: General;  Laterality: Right;  ?  PORTACATH PLACEMENT Right 08/17/2014  ? Procedure:  PLACE NEW PORT A CATH;  Surgeon: Fanny Skates, MD;  Location: [place];  Service: General;  Laterality: Right;  ? RECTAL BIOPSY N/A 09/09/2015  ? Procedure: BIOPSY OF RECTOVAGINAL MASS;  Surgeon: Leighton Ruff, MD;  Location: [place];  Service: General;  Laterality: N/A;  ? TUBAL LIGATION  YRS AGO  ? VAGINAL HYSTERECTOMY  1981  ? fibroids  ? ? ?Family Psychiatric History: see below ? ?Family History:  ?Family History  ?Problem Relation Age of Onset  ? Bipolar disorder Mother   ? Anxiety disorder Mother   ? Dementia Mother   ? Depression Mother   ? Mental illness Mother   ? Vision loss Mother   ? Alzheimer's disease Mother   ? Alcohol abuse Paternal Uncle   ? Bipolar disorder Maternal Grandmother   ? Dementia Maternal Grandmother   ? Alzheimer's disease Maternal Grandmother   ? Alcohol abuse Paternal Uncle   ? Stroke Brother   ? Deep vein thrombosis Son   ? Heart disease Father   ?  Hyperlipidemia Father   ? Hypertension Father   ? Stroke Father   ? Vision loss Father   ? Atrial fibrillation Sister   ? Heart disease Brother   ? Heart disease Brother   ? ADD / ADHD Neg Hx   ? Drug abuse Neg Hx   ? OCD Neg Hx   ? Paranoid behavior Neg Hx   ? Schizophrenia Neg Hx   ? Seizures Neg Hx   ? Sexual abuse Neg Hx   ? Physical abuse Neg Hx   ? ? ?Social History:  ?Social History  ? ?Socioeconomic History  ? Marital status: Married  ?  Spouse name: Coralyn Mark  ? Number of children: 3  ? Years of education: 15  ? Highest education level: Not on file  ?Occupational History  ? Occupation: retire  ?  Comment: [employer]  ?Tobacco Use  ? Smoking status: Former  ?  Packs/day: 0.50  ?  Years: 43.00  ?  Pack years: 21.50  ?  Types: Cigarettes  ?  Quit date: 06/25/2021  ?  Years since quitting: 0.4  ? Smokeless tobacco: Never  ?Vaping Use  ? Vaping Use: Never used  ?Substance and Sexual Activity  ? Alcohol use: No  ? Drug use: No  ? Sexual activity: Not  Currently  ?  Birth control/protection: Surgical  ?  Comment: hyst  ?Other Topics Concern  ? Not on file  ?Social History Narrative  ? Lives at home with Coralyn Mark  ? Retired [employer]  ? ?Social Determinants

## 2022-01-01 ENCOUNTER — Other Ambulatory Visit: Payer: Self-pay | Admitting: Family Medicine

## 2022-01-03 ENCOUNTER — Other Ambulatory Visit: Payer: Self-pay | Admitting: Family Medicine

## 2022-01-03 MED ORDER — HYDROCODONE-ACETAMINOPHEN 7.5-325 MG PO TABS: 1.0000 | ORAL_TABLET | Freq: Four times a day (QID) | ORAL | 0 refills | Status: DC | PRN

## 2022-01-03 NOTE — Telephone Encounter (Signed)
LOV 11/29/21 ?Last refill 12/20/21, #30, 0 refills ?  ? ?Please review ?

## 2022-01-13 IMAGING — CT CT ABD-PELV W/ CM
2 of 6 series · 16 of 46 positions shown, 18 images · IV contrast (Omnipaque or Isovue)
Comparison: 06/24/2020

CLINICAL DATA: Unintentional weight loss. Personal history ovarian
and anal carcinoma.

EXAM:
CT ABDOMEN AND PELVIS WITH CONTRAST
TECHNIQUE: Multidetector CT imaging of the abdomen and pelvis was performed
using the standard protocol following bolus administration of
intravenous contrast.
CONTRAST:  75mL OMNIPAQUE IOHEXOL 350 MG/ML SOLN

[Series 2: axial st · axial · 0.60mm/px · z∈[+743,+1113]mm · 13 of 86 slices shown, 15 images]
[im 6/86  soft-tissue]
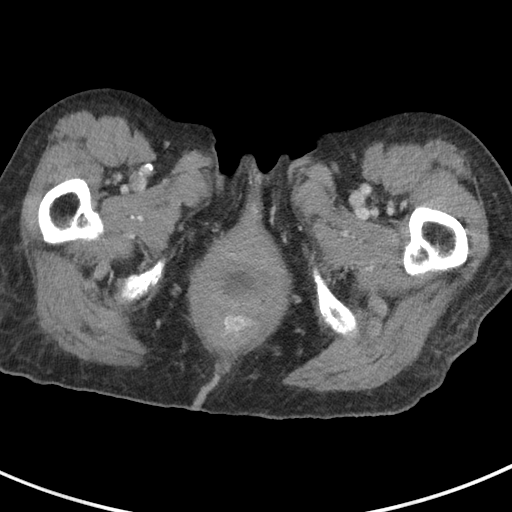
[im 6/86  bone]
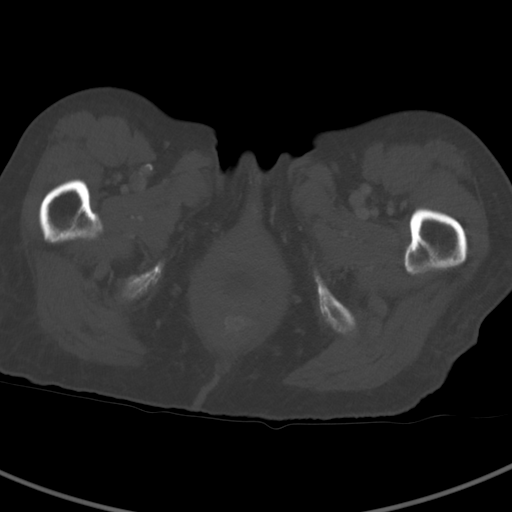
[im 11/86  soft-tissue]
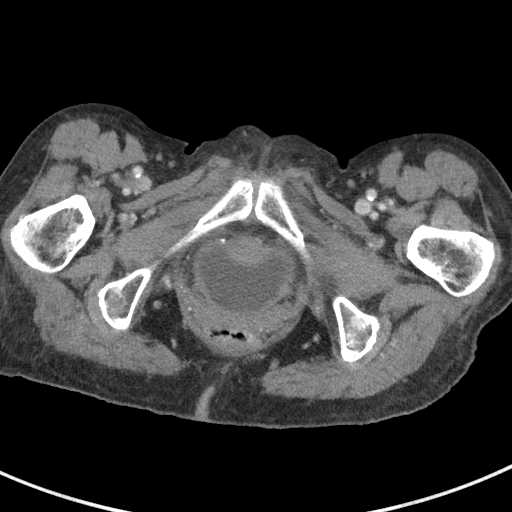
[im 16/86  soft-tissue]
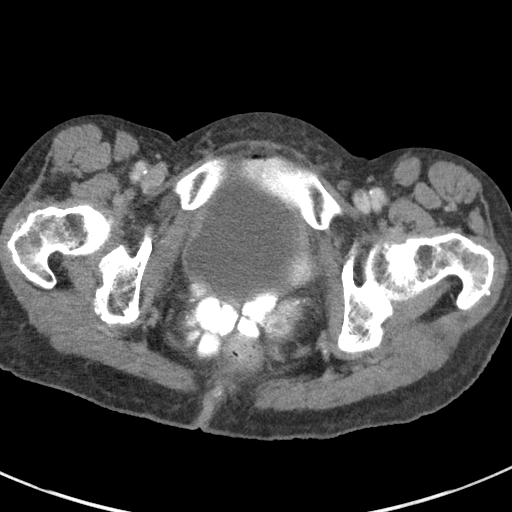
[im 27/86  soft-tissue]
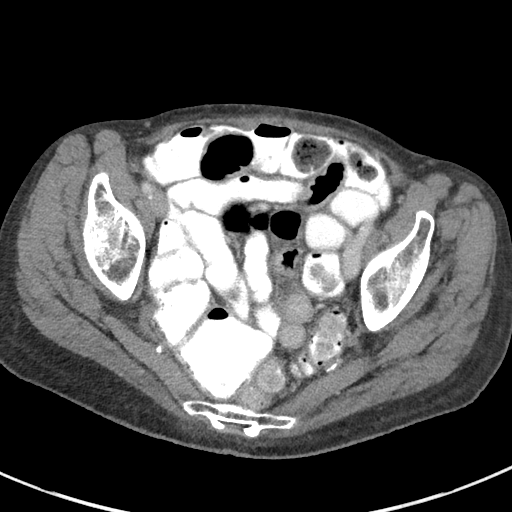
[im 32/86  soft-tissue]
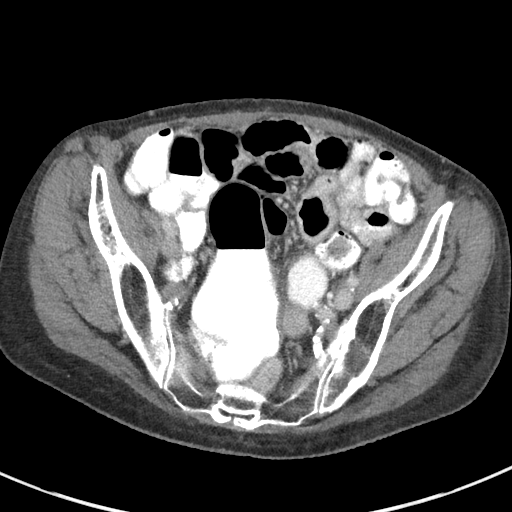
[im 38/86  soft-tissue]
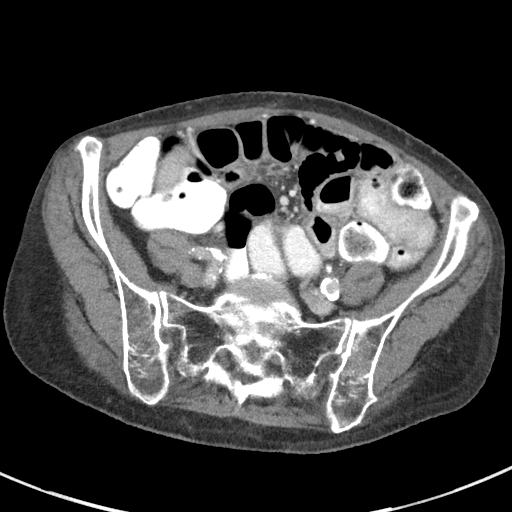
[im 43/86  soft-tissue]
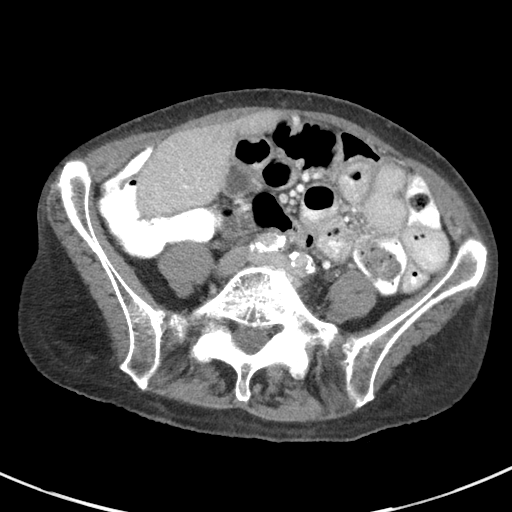
[im 48/86  soft-tissue]
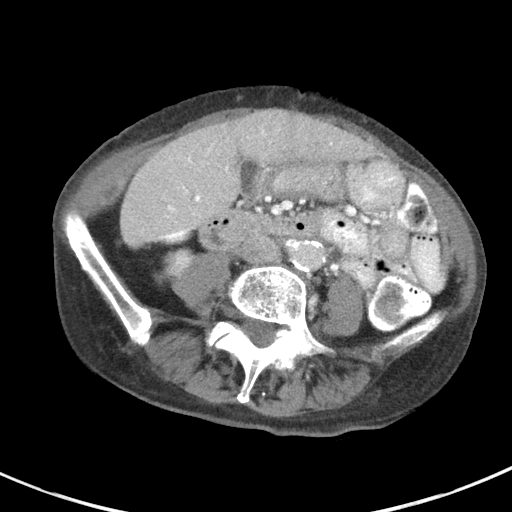
[im 54/86  soft-tissue]
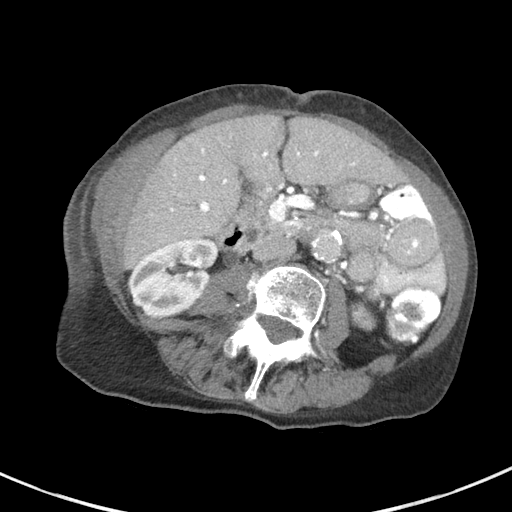
[im 54/86  bone]
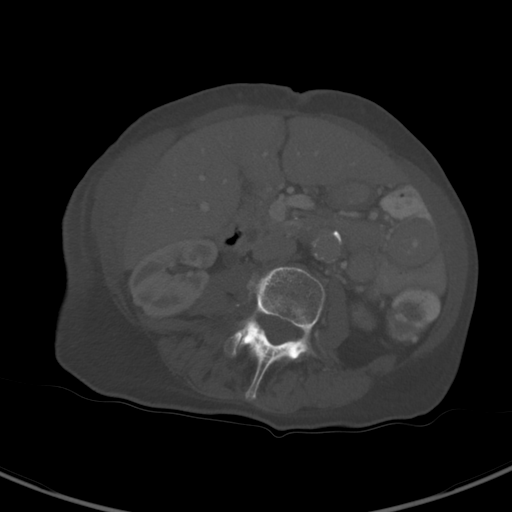
[im 59/86  soft-tissue]
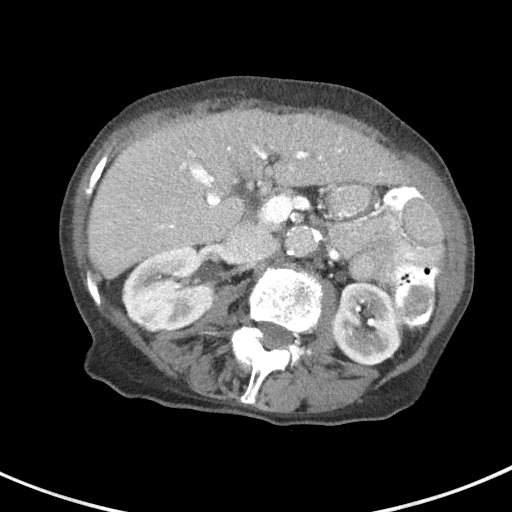
[im 70/86  soft-tissue]
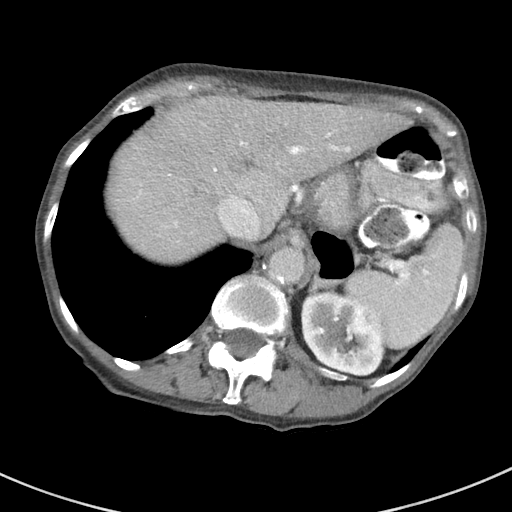
[im 75/86  soft-tissue]
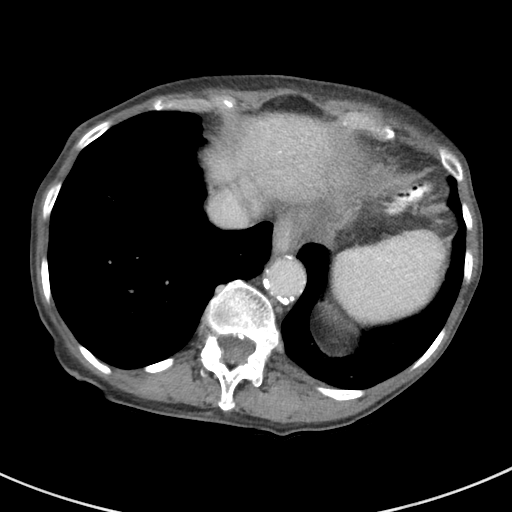
[im 80/86  soft-tissue]
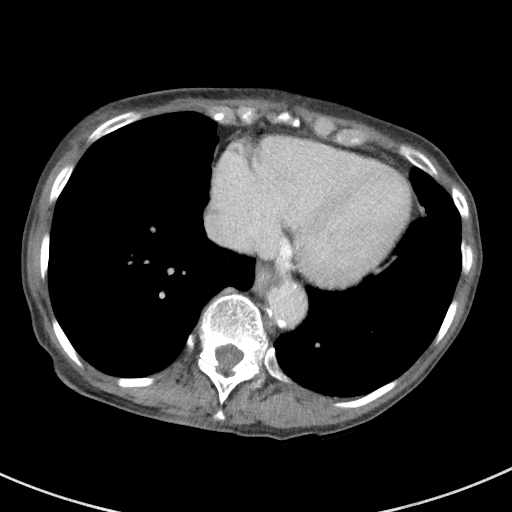

[Series 7: coronal st · coronal · 0.69mm/px · 3 of 117 slices shown]
[im 39/117  soft-tissue]
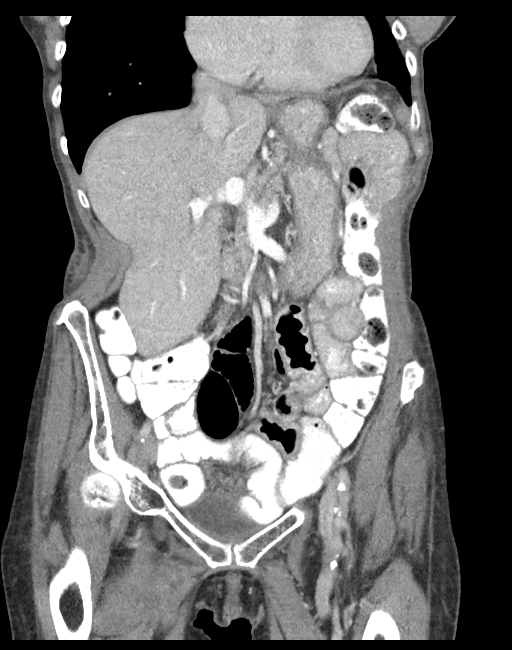
[im 52/117  soft-tissue]
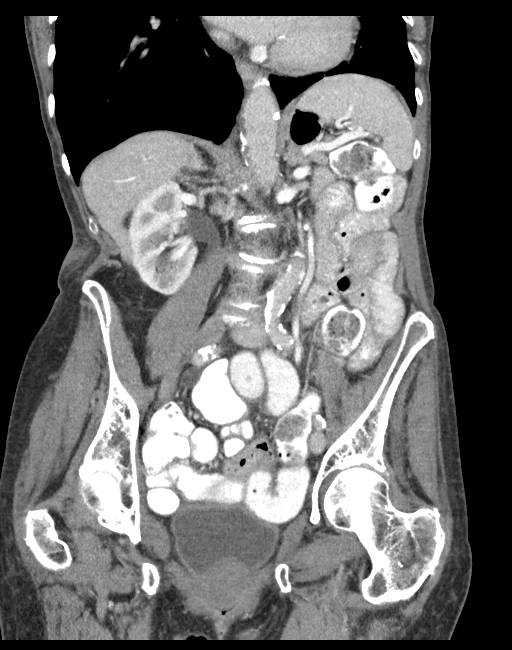
[im 65/117  soft-tissue]
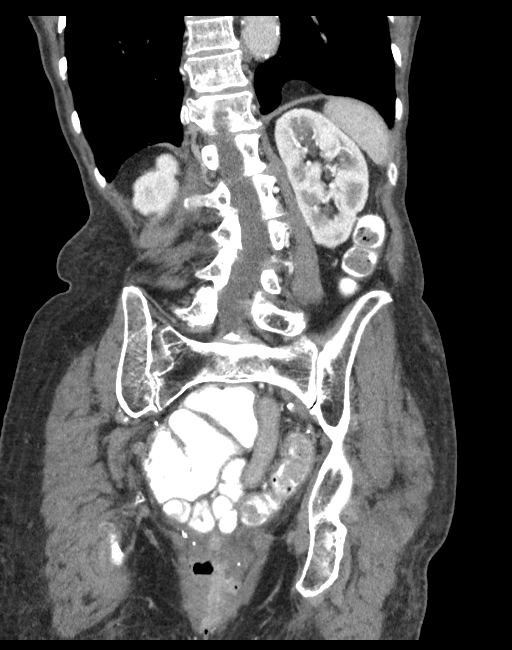

[16 of 46 positions shown; findings below may reference images not displayed]

FINDINGS: Lower Chest: No acute findings.

Hepatobiliary: No hepatic masses identified. Gallbladder is
unremarkable. No evidence of biliary ductal dilatation.

Pancreas:  No mass or inflammatory changes.

Spleen: Within normal limits in size and appearance.

Adrenals/Urinary Tract: No masses identified. No evidence of
ureteral calculi or hydronephrosis. Pelvic floor laxity with
moderate cystocele noted.

Stomach/Bowel: 3 cm diverticulum again seen arising from the
posterior gastric fundus. No evidence of bowel obstruction,
inflammatory process or abnormal fluid collections.

Vascular/Lymphatic: No pathologically enlarged lymph nodes. No acute
vascular findings. Aortic atherosclerotic calcification noted.

Reproductive: Prior hysterectomy noted. Adnexal regions are
unremarkable in appearance.

Other:  None.

Musculoskeletal:  No suspicious bone lesions identified.
IMPRESSION: No acute findings. No evidence of recurrent or metastatic carcinoma.

Pelvic floor laxity with moderate cystocele.

Stable 3 cm gastric fundal diverticulum.

Aortic Atherosclerosis ([id number]).

## 2022-01-17 ENCOUNTER — Other Ambulatory Visit: Payer: Self-pay | Admitting: Family Medicine

## 2022-01-17 MED ORDER — APIXABAN 5 MG PO TABS: 5.0000 mg | ORAL_TABLET | Freq: Two times a day (BID) | ORAL | 5 refills | Status: DC

## 2022-01-17 MED ORDER — HYDROCODONE-ACETAMINOPHEN 7.5-325 MG PO TABS: 1.0000 | ORAL_TABLET | Freq: Four times a day (QID) | ORAL | 0 refills | Status: DC | PRN

## 2022-01-17 NOTE — Telephone Encounter (Signed)
CB# [phone] ?Patient left vm requesting refills on eliquis and hydrocodone  ?

## 2022-01-17 NOTE — Telephone Encounter (Signed)
LOV 10/13/21 ?Last refill 01/03/22, #30, 0 refills ? ?Please review, thanks! ? ?

## 2022-01-19 ENCOUNTER — Telehealth: Payer: Self-pay

## 2022-01-19 NOTE — Telephone Encounter (Signed)
Pt would like to know if you increase pt's hydrocodone-acetaminophen due to the 7.5-325 MG tablet  is on  ? ?Pls advice ?

## 2022-01-20 ENCOUNTER — Other Ambulatory Visit: Payer: Self-pay | Admitting: Family Medicine

## 2022-01-20 MED ORDER — OXYCODONE-ACETAMINOPHEN 7.5-325 MG PO TABS: 1.0000 | ORAL_TABLET | ORAL | 0 refills | Status: DC | PRN

## 2022-01-20 NOTE — Telephone Encounter (Signed)
Spoke with pt that Dr. Dennard Schaumann had already sent in pain meds ?

## 2022-01-20 NOTE — Telephone Encounter (Signed)
Spoke with pt, per pt the 7.5 is on back order and she is out of her pain meds. Any suggestions ? ?

## 2022-01-25 ENCOUNTER — Other Ambulatory Visit (HOSPITAL_COMMUNITY): Payer: Medicare Other

## 2022-01-27 ENCOUNTER — Ambulatory Visit (INDEPENDENT_AMBULATORY_CARE_PROVIDER_SITE_OTHER): Payer: Medicare Other

## 2022-01-27 VITALS — Ht 65.0 in | Wt 118.0 lb

## 2022-01-27 DIAGNOSIS — Z0000 Encounter for general adult medical examination without abnormal findings: Secondary | ICD-10-CM | POA: Diagnosis not present

## 2022-01-27 NOTE — Patient Instructions (Signed)
Abigail Wagner , ?Thank you for taking time to come for your Medicare Wellness Visit. I appreciate your ongoing commitment to your health goals. Please review the following plan we discussed and let me know if I can assist you in the future.  ? ?Screening recommendations/referrals: ?Colonoscopy: Done 10/12/20 Repeat in 10 years ? ?Mammogram: Done 07/28/2019 Repeat annually ?  ?Bone Density: Done 09/02/2007 Repeat every 2 years ? ?Recommended yearly ophthalmology/optometry visit for glaucoma screening and checkup ?Recommended yearly dental visit for hygiene and checkup ? ?Vaccinations: ?Influenza vaccine: Due Fall 2023. ?Pneumococcal vaccine: Done 11/07/16 and 05/14/18. ?Tdap vaccine: Done 09/25/2008 Repeat in 10 years ? ?Shingles vaccine: Discussed.   ?Covid-19:Done 08/16/20, 01/24/20 and 12/25/19. ? ?Advanced directives: Please bring a copy of your health care power of attorney and living will to the office to be added to your chart at your convenience. ? ? ?Conditions/risks identified: Aim for 30 minutes of exercise, 6-8 glasses of water, and 5 servings of fruits and vegetables each day. ? ? ?Next appointment: Follow up in one year for your annual wellness visit 2024. ? ? ?Preventive Care 42 Years and Older, Female ?Preventive care refers to lifestyle choices and visits with your health care provider that can promote health and wellness. ?What does preventive care include? ?A yearly physical exam. This is also called an annual well check. ?Dental exams once or twice a year. ?Routine eye exams. Ask your health care provider how often you should have your eyes checked. ?Personal lifestyle choices, including: ?Daily care of your teeth and gums. ?Regular physical activity. ?Eating a healthy diet. ?Avoiding tobacco and drug use. ?Limiting alcohol use. ?Practicing safe sex. ?Taking low-dose aspirin every day. ?Taking vitamin and mineral supplements as recommended by your health care provider. ?What happens during an annual well  check? ?The services and screenings done by your health care provider during your annual well check will depend on your age, overall health, lifestyle risk factors, and family history of disease. ?Counseling  ?Your health care provider may ask you questions about your: ?Alcohol use. ?Tobacco use. ?Drug use. ?Emotional well-being. ?Home and relationship well-being. ?Sexual activity. ?Eating habits. ?History of falls. ?Memory and ability to understand (cognition). ?Work and work Statistician. ?Reproductive health. ?Screening  ?You may have the following tests or measurements: ?Height, weight, and BMI. ?Blood pressure. ?Lipid and cholesterol levels. These may be checked every 5 years, or more frequently if you are over 31 years old. ?Skin check. ?Lung cancer screening. You may have this screening every year starting at age 34 if you have a 30-pack-year history of smoking and currently smoke or have quit within the past 15 years. ?Fecal occult blood test (FOBT) of the stool. You may have this test every year starting at age 24. ?Flexible sigmoidoscopy or colonoscopy. You may have a sigmoidoscopy every 5 years or a colonoscopy every 10 years starting at age 54. ?Hepatitis C blood test. ?Hepatitis B blood test. ?Sexually transmitted disease (STD) testing. ?Diabetes screening. This is done by checking your blood sugar (glucose) after you have not eaten for a while (fasting). You may have this done every 1-3 years. ?Bone density scan. This is done to screen for osteoporosis. You may have this done starting at age 64. ?Mammogram. This may be done every 1-2 years. Talk to your health care provider about how often you should have regular mammograms. ?Talk with your health care provider about your test results, treatment options, and if necessary, the need for more tests. ?Vaccines  ?Your  health care provider may recommend certain vaccines, such as: ?Influenza vaccine. This is recommended every year. ?Tetanus, diphtheria, and  acellular pertussis (Tdap, Td) vaccine. You may need a Td booster every 10 years. ?Zoster vaccine. You may need this after age 36. ?Pneumococcal 13-valent conjugate (PCV13) vaccine. One dose is recommended after age 56. ?Pneumococcal polysaccharide (PPSV23) vaccine. One dose is recommended after age 32. ?Talk to your health care provider about which screenings and vaccines you need and how often you need them. ?This information is not intended to replace advice given to you by your health care provider. Make sure you discuss any questions you have with your health care provider. ?Document Released: 10/08/2015 Document Revised: 05/31/2016 Document Reviewed: 07/13/2015 ?Elsevier Interactive Patient Education ? 2017 [place]. ? ?Fall Prevention in the Home ?Falls can cause injuries. They can happen to people of all ages. There are many things you can do to make your home safe and to help prevent falls. ?What can I do on the outside of my home? ?Regularly fix the edges of walkways and driveways and fix any cracks. ?Remove anything that might make you trip as you walk through a door, such as a raised step or threshold. ?Trim any bushes or trees on the path to your home. ?Use bright outdoor lighting. ?Clear any walking paths of anything that might make someone trip, such as rocks or tools. ?Regularly check to see if handrails are loose or broken. Make sure that both sides of any steps have handrails. ?Any raised decks and porches should have guardrails on the edges. ?Have any leaves, snow, or ice cleared regularly. ?Use sand or salt on walking paths during winter. ?Clean up any spills in your garage right away. This includes oil or grease spills. ?What can I do in the bathroom? ?Use night lights. ?Install grab bars by the toilet and in the tub and shower. Do not use towel bars as grab bars. ?Use non-skid mats or decals in the tub or shower. ?If you need to sit down in the shower, use a plastic, non-slip stool. ?Keep  the floor dry. Clean up any water that spills on the floor as soon as it happens. ?Remove soap buildup in the tub or shower regularly. ?Attach bath mats securely with double-sided non-slip rug tape. ?Do not have throw rugs and other things on the floor that can make you trip. ?What can I do in the bedroom? ?Use night lights. ?Make sure that you have a light by your bed that is easy to reach. ?Do not use any sheets or blankets that are too big for your bed. They should not hang down onto the floor. ?Have a firm chair that has side arms. You can use this for support while you get dressed. ?Do not have throw rugs and other things on the floor that can make you trip. ?What can I do in the kitchen? ?Clean up any spills right away. ?Avoid walking on wet floors. ?Keep items that you use a lot in easy-to-reach places. ?If you need to reach something above you, use a strong step stool that has a grab bar. ?Keep electrical cords out of the way. ?Do not use floor polish or wax that makes floors slippery. If you must use wax, use non-skid floor wax. ?Do not have throw rugs and other things on the floor that can make you trip. ?What can I do with my stairs? ?Do not leave any items on the stairs. ?Make sure that there are handrails  on both sides of the stairs and use them. Fix handrails that are broken or loose. Make sure that handrails are as long as the stairways. ?Check any carpeting to make sure that it is firmly attached to the stairs. Fix any carpet that is loose or worn. ?Avoid having throw rugs at the top or bottom of the stairs. If you do have throw rugs, attach them to the floor with carpet tape. ?Make sure that you have a light switch at the top of the stairs and the bottom of the stairs. If you do not have them, ask someone to add them for you. ?What else can I do to help prevent falls? ?Wear shoes that: ?Do not have high heels. ?Have rubber bottoms. ?Are comfortable and fit you well. ?Are closed at the toe. Do not  wear sandals. ?If you use a stepladder: ?Make sure that it is fully opened. Do not climb a closed stepladder. ?Make sure that both sides of the stepladder are locked into place. ?Ask someone to hold it for you, if

## 2022-01-27 NOTE — Progress Notes (Signed)
? ?Subjective:  ? Abigail Wagner is a 71 y.o. female who presents for Medicare Annual (Subsequent) preventive examination. ?Virtual Visit via Telephone Note ? ?I connected with  [name] on 01/27/22 at 10:30 AM EDT by telephone and verified that I am speaking with the correct person using two identifiers. ? ?Location: ?Patient: HOME ?Provider: [facility] ?Persons participating in the virtual visit: patient/Nurse Health Advisor ?  ?I discussed the limitations, risks, security and privacy concerns of performing an evaluation and management service by telephone and the availability of in person appointments. The patient expressed understanding and agreed to proceed. ? ?Interactive audio and video telecommunications were attempted between this nurse and patient, however failed, due to patient having technical difficulties OR patient did not have access to video capability.  We continued and completed visit with audio only. ? ?Some vital signs may be absent or patient reported.  ? ?Chriss Driver, LPN ? ?Review of Systems    ? ?Cardiac Risk Factors include: advanced age (>31mn, >>51women);hypertension;sedentary lifestyle;Other (see comment), Risk factor comments: AFIB, Stenosis of spine ? ?   ?Objective:  ?  ?Today's Vitals  ? 01/27/22 1043 01/27/22 1047  ?Weight: 118 lb (53.5 kg)   ?Height: '5\' 5"'$  (1.651 m)   ?PainSc:  6   ? ?Body mass index is 19.64 kg/m?. ? ? ?  01/27/2022  ? 10:55 AM 11/18/2021  ? 11:57 AM 11/03/2021  ?  7:37 AM 11/01/2021  ?  8:43 AM 08/01/2021  ?  2:17 PM 07/25/2021  ?  3:20 PM 06/28/2021  ?  7:00 PM  ?Advanced Directives  ?Does Patient Have a Medical Advance Directive? Yes Yes Yes Yes Yes Yes Yes  ?Type of AParamedicof AHudsonLiving will HKitsapLiving will HHiggstonLiving will HJeffersonLiving will HLittle FerryLiving will Living will;Healthcare Power of AAvenalLiving will   ?Does patient want to make changes to medical advance directive?    No - Patient declined No - Patient declined No - Patient declined No - Patient declined  ?Copy of HRosein Chart? No - copy requested No - copy requested No - copy requested No - copy requested No - copy requested  No - copy requested  ?Would patient like information on creating a medical advance directive?     No - Patient declined No - Patient declined No - Patient declined  ? ? ?Current Medications (verified) ?Outpatient Encounter Medications as of 01/27/2022  ?Medication Sig  ? amiodarone (PACERONE) 200 MG tablet TAKE 1 TABLET BY MOUTH EVERY DAY  ? apixaban (ELIQUIS) 5 MG TABS tablet Take 1 tablet (5 mg total) by mouth 2 (two) times daily.  ? b complex vitamins tablet Take 1 tablet by mouth daily.  ? Cholecalciferol (VITAMIN D-3) 125 MCG (5000 UT) TABS Take 5,000 Units by mouth daily.  ? clotrimazole (LOTRIMIN) 1 % cream Apply 1 application topically 2 (two) times daily as needed (itching around ankles).  ? Coenzyme Q10 (CO Q 10) 100 MG CAPS Take 100 mg by mouth daily.  ? divalproex (DEPAKOTE ER) 500 MG 24 hr tablet Take 1 tablet (500 mg total) by mouth daily.  ? escitalopram (LEXAPRO) 20 MG tablet Take 1 tablet (20 mg total) by mouth daily.  ? fluorometholone (FML) 0.1 % ophthalmic suspension 1 drop 4 (four) times daily.  ? furosemide (LASIX) 40 MG tablet Take 40 mg by mouth daily as  needed for edema.  ? Homeopathic Products (THERAWORX RELIEF EX) Apply 1 application topically daily as needed.  ? HYDROcodone-acetaminophen (NORCO) 7.5-325 MG tablet Take 1 tablet by mouth every 6 (six) hours as needed for moderate pain.  ? Krill Oil 350 MG CAPS Take 350 mg by mouth daily.  ? LORazepam (ATIVAN) 0.5 MG tablet Take 1 tablet (0.5 mg total) by mouth 3 (three) times daily as needed for anxiety.  ? Multiple Vitamin (MULTIVITAMIN) tablet Take 1 tablet by mouth daily.  ? Multiple Vitamins-Minerals (HAIR SKIN & NAILS ADVANCED PO) Take  3 tablets by mouth daily.  ? omeprazole (PRILOSEC) 40 MG capsule TAKE 1 CAPSULE (40 MG TOTAL) BY MOUTH DAILY.  ? ondansetron (ZOFRAN) 4 MG tablet Take 1 tablet (4 mg total) by mouth daily as needed for nausea or vomiting.  ? oxyCODONE-acetaminophen (PERCOCET) 7.5-325 MG tablet Take 1 tablet by mouth every 4 (four) hours as needed for severe pain.  ? polyethylene glycol (MIRALAX / GLYCOLAX) 17 g packet Take 17 g by mouth daily.  ? rosuvastatin (CRESTOR) 20 MG tablet Take 1 tablet (20 mg total) by mouth daily.  ? ?No facility-administered encounter medications on file as of 01/27/2022.  ? ? ?Allergies (verified) ?Morphine and related  ? ?History: ?Past Medical History:  ?Diagnosis Date  ? Allergy   ? Anal carcinoma ([place])   ? Chemo and radiation  ? Anemia   ? Anxiety   ? Dr. Harrington Challenger at [facility] (psychiatrist).    ? Blood transfusion without reported diagnosis 2017  ? Carotid artery disease ([place])   ? Chronic kidney disease   ? DDD (degenerative disc disease), lumbosacral   ? GERD (gastroesophageal reflux disease)   ? Headache(784.0)   ? History of adenomatous polyp of colon 10/07/2015  ? Tubular adenoma high grade dysplasia  ? History of herpes genitalis   ? History of kidney stones   ? History of suicide attempt 2010  ? HTN (hypertension)   ? Hyperlipidemia   ? IBS (irritable bowel syndrome)   ? Major depression   ? OA (osteoarthritis)   ? Ovarian cancer ([place])   ? Stage IIIB papillary ovarian carcinoma  s/p omentectomy and BSO & chemotherapy (completed 12-07-2014)  ? Prolapse of vaginal vault after hysterectomy 07/20/2015  ? Rectovaginal fistula 08/05/2015  ? Seasonal allergies   ? Sigmoid diverticulosis   ? Smokers' cough ([place])   ? ?Past Surgical History:  ?Procedure Laterality Date  ? BIOPSY  10/12/2020  ? Procedure: BIOPSY;  Surgeon: Montez Morita, Quillian Quince, MD;  [facility];  Service: Gastroenterology;;  ? [place]  ? BREAST SURGERY  1986  ? [place]  ? COLONOSCOPY  N/A 10/07/2015  ? Procedure: COLONOSCOPY;  Surgeon: Rogene Houston, MD;  [facility];  Service: Endoscopy;  Laterality: N/A;  7:30  ? COLONOSCOPY WITH PROPOFOL N/A 10/12/2020  ? Procedure: COLONOSCOPY WITH PROPOFOL;  Surgeon: Harvel Quale, MD;  [facility];  Service: Gastroenterology;  Laterality: N/A;  9:00  ? [place]  ? [place] N/A 11/03/2021  ? Procedure: CYSTOSCOPY WITH FULGERATION;  Surgeon: Cleon Gustin, MD;  [facility];  Service: Urology;  Laterality: N/A;  ? ESOPHAGOGASTRODUODENOSCOPY (EGD) WITH PROPOFOL N/A 10/12/2020  ? Procedure: ESOPHAGOGASTRODUODENOSCOPY (EGD) WITH PROPOFOL;  Surgeon: Harvel Quale, MD;  [facility];  Service: Gastroenterology;  Laterality: N/A;  ? EXPLORATORY LAPAROTOMY/ OMENTECTOMY/  BILATERAL SALPINGOOPHORECTOMY/  PORT-A-CATH PLACEMENT  07-03-2014   [facility]  ? KNEE ARTHROSCOPY Left 09/22/2004  ? LAPAROSCOPY N/A 06/02/2014  ? Procedure: DIAGNOSTIC LAPAROSCOPY, OMENTAL BIOPSY, RIGHT OVARY BIOPSY, LYSIS OF ADHESIONS;  Surgeon: Fanny Skates, MD;  Location: [place];  Service: General;  Laterality: N/A;  ? MUCOSAL ADVANCEMENT FLAP N/A 06/15/2016  ? Procedure: EXCISION RECTOVAGINAOL FISTULA WITH MUCOSAL ADVANCEMENT FLAP;  Surgeon: Leighton Ruff, MD;  Location: [place];  Service: General;  Laterality: N/A;  ? [place] N/A 09/09/2015  ? Procedure: PLACEMENT OF SETON;  Surgeon: Leighton Ruff, MD;  Location: [facility];  Service: General;  Laterality: N/A;  ? PORT-A-CATH REMOVAL Right 08/17/2014  ? Procedure: REMOVAL INTRAPERITONEAL CHEMO PORT;  Surgeon: Fanny Skates, MD;  Location: [place];  Service: General;  Laterality: Right;  ? PORTACATH PLACEMENT Right 08/17/2014  ? Procedure:  PLACE NEW PORT A CATH;  Surgeon: Fanny Skates, MD;  Location: [facility];  Service: General;  Laterality: Right;  ?  RECTAL BIOPSY N/A 09/09/2015  ? Procedure: BIOPSY OF RECTOVAGINAL MASS;  Surgeon: Leighton Ruff, MD;  Location: [place];  Service: General;  Laterality: N/A;  ? TUBAL LIGATION  YRS AGO

## 2022-02-01 ENCOUNTER — Inpatient Hospital Stay (HOSPITAL_COMMUNITY): Payer: Medicare Other | Attending: Hematology

## 2022-02-01 ENCOUNTER — Other Ambulatory Visit: Payer: Self-pay | Admitting: Family Medicine

## 2022-02-01 ENCOUNTER — Ambulatory Visit (HOSPITAL_COMMUNITY): Payer: Medicare Other | Admitting: Hematology

## 2022-02-01 ENCOUNTER — Encounter (HOSPITAL_COMMUNITY): Payer: Self-pay

## 2022-02-01 DIAGNOSIS — Z90710 Acquired absence of both cervix and uterus: Secondary | ICD-10-CM | POA: Diagnosis not present

## 2022-02-01 DIAGNOSIS — Z7901 Long term (current) use of anticoagulants: Secondary | ICD-10-CM | POA: Diagnosis not present

## 2022-02-01 DIAGNOSIS — Z8543 Personal history of malignant neoplasm of ovary: Secondary | ICD-10-CM | POA: Insufficient documentation

## 2022-02-01 DIAGNOSIS — Z923 Personal history of irradiation: Secondary | ICD-10-CM | POA: Insufficient documentation

## 2022-02-01 DIAGNOSIS — I4891 Unspecified atrial fibrillation: Secondary | ICD-10-CM | POA: Diagnosis not present

## 2022-02-01 DIAGNOSIS — Z85048 Personal history of other malignant neoplasm of rectum, rectosigmoid junction, and anus: Secondary | ICD-10-CM | POA: Diagnosis not present

## 2022-02-01 DIAGNOSIS — D649 Anemia, unspecified: Secondary | ICD-10-CM | POA: Insufficient documentation

## 2022-02-01 DIAGNOSIS — C210 Malignant neoplasm of anus, unspecified: Secondary | ICD-10-CM

## 2022-02-01 DIAGNOSIS — C569 Malignant neoplasm of unspecified ovary: Secondary | ICD-10-CM

## 2022-02-01 DIAGNOSIS — Z9221 Personal history of antineoplastic chemotherapy: Secondary | ICD-10-CM | POA: Diagnosis not present

## 2022-02-01 DIAGNOSIS — Z87891 Personal history of nicotine dependence: Secondary | ICD-10-CM | POA: Insufficient documentation

## 2022-02-01 DIAGNOSIS — R319 Hematuria, unspecified: Secondary | ICD-10-CM | POA: Insufficient documentation

## 2022-02-01 LAB — COMPREHENSIVE METABOLIC PANEL
ALT: 14 U/L (ref 0–44)
AST: 24 U/L (ref 15–41)
Albumin: 3.8 g/dL (ref 3.5–5.0)
Alkaline Phosphatase: 91 U/L (ref 38–126)
Anion gap: 10 (ref 5–15)
BUN: 17 mg/dL (ref 8–23)
CO2: 24 mmol/L (ref 22–32)
Calcium: 8.8 mg/dL — ABNORMAL LOW (ref 8.9–10.3)
Chloride: 102 mmol/L (ref 98–111)
Creatinine, Ser: 0.89 mg/dL (ref 0.44–1.00)
GFR, Estimated: >60 mL/min (ref >60)
Glucose, Bld: 153 mg/dL — ABNORMAL HIGH (ref 70–99)
Potassium: 3.9 mmol/L (ref 3.5–5.1)
Sodium: 136 mmol/L (ref 135–145)
Total Bilirubin: 0.4 mg/dL (ref 0.3–1.2)
Total Protein: 7 g/dL (ref 6.5–8.1)

## 2022-02-01 LAB — CBC WITH DIFFERENTIAL/PLATELET
Abs Immature Granulocytes: 0.04 10*3/uL (ref 0.00–0.07)
Basophils Absolute: 0 10*3/uL (ref 0.0–0.1)
Basophils Relative: 0 %
Eosinophils Absolute: 0.1 10*3/uL (ref 0.0–0.5)
Eosinophils Relative: 2 %
HCT: 33.6 % — ABNORMAL LOW (ref 36.0–46.0)
Hemoglobin: 11.2 g/dL — ABNORMAL LOW (ref 12.0–15.0)
Immature Granulocytes: 1 %
Lymphocytes Relative: 15 %
Lymphs Abs: 1.1 10*3/uL (ref 0.7–4.0)
MCH: 32.4 pg (ref 26.0–34.0)
MCHC: 33.3 g/dL (ref 30.0–36.0)
MCV: 97.1 fL (ref 80.0–100.0)
Monocytes Absolute: 0.6 10*3/uL (ref 0.1–1.0)
Monocytes Relative: 8 %
Neutro Abs: 5.4 10*3/uL (ref 1.7–7.7)
Neutrophils Relative %: 74 %
Platelets: 214 10*3/uL (ref 150–400)
RBC: 3.46 MIL/uL — ABNORMAL LOW (ref 3.87–5.11)
RDW: 15.9 % — ABNORMAL HIGH (ref 11.5–15.5)
WBC: 7.3 10*3/uL (ref 4.0–10.5)
nRBC: 0 % (ref 0.0–0.2)

## 2022-02-01 LAB — IRON AND TIBC
Iron: 26 ug/dL — ABNORMAL LOW (ref 28–170)
Saturation Ratios: 7 % — ABNORMAL LOW (ref 10.4–31.8)
TIBC: 375 ug/dL (ref 250–450)
UIBC: 349 ug/dL

## 2022-02-01 LAB — FERRITIN: Ferritin: 33 ng/mL (ref 11–307)

## 2022-02-01 MED ORDER — HEPARIN SOD (PORK) LOCK FLUSH 100 UNIT/ML IV SOLN
500.0000 [IU] | Freq: Once | INTRAVENOUS | Status: AC
  Administered 2022-02-01: 500 [IU] via INTRAVENOUS

## 2022-02-01 MED ORDER — SODIUM CHLORIDE 0.9% FLUSH
10.0000 mL | Freq: Once | INTRAVENOUS | Status: AC
  Administered 2022-02-01: 10 mL via INTRAVENOUS

## 2022-02-01 NOTE — Patient Instructions (Signed)
[facility]  Discharge Instructions: Thank you for choosing [facility] to provide your oncology and hematology care.  If you have a lab appointment with the Cancer Center, please come in thru the Main Entrance and check in at the main information desk.  Wear comfortable clothing and clothing appropriate for easy access to any Portacath or PICC line.   We strive to give you quality time with your provider. You may need to reschedule your appointment if you arrive late (15 or more minutes).  Arriving late affects you and other patients whose appointments are after yours.  Also, if you miss three or more appointments without notifying the office, you may be dismissed from the clinic at the provider's discretion.      For prescription refill requests, have your pharmacy contact our office and allow 72 hours for refills to be completed.        To help prevent nausea and vomiting after your treatment, we encourage you to take your nausea medication as directed.  BELOW ARE SYMPTOMS THAT SHOULD BE REPORTED IMMEDIATELY: *FEVER GREATER THAN 100.4 F (38 C) OR HIGHER *CHILLS OR SWEATING *NAUSEA AND VOMITING THAT IS NOT CONTROLLED WITH YOUR NAUSEA MEDICATION *UNUSUAL SHORTNESS OF BREATH *UNUSUAL BRUISING OR BLEEDING *URINARY PROBLEMS (pain or burning when urinating, or frequent urination) *BOWEL PROBLEMS (unusual diarrhea, constipation, pain near the anus) TENDERNESS IN MOUTH AND THROAT WITH OR WITHOUT PRESENCE OF ULCERS (sore throat, sores in mouth, or a toothache) UNUSUAL RASH, SWELLING OR PAIN  UNUSUAL VAGINAL DISCHARGE OR ITCHING   Items with * indicate a potential emergency and should be followed up as soon as possible or go to the Emergency Department if any problems should occur.  Please show the CHEMOTHERAPY ALERT CARD or IMMUNOTHERAPY ALERT CARD at check-in to the Emergency Department and triage nurse.  Should you have questions after your visit or need to cancel  or reschedule your appointment, please contact [facility] [phone]  and follow the prompts.  Office hours are 8:00 a.m. to 4:30 p.m. Monday - Friday. Please note that voicemails left after 4:00 p.m. may not be returned until the following business day.  We are closed weekends and major holidays. You have access to a nurse at all times for urgent questions. Please call the main number to the clinic [phone] and follow the prompts.  For any non-urgent questions, you may also contact your provider using MyChart. We now offer e-Visits for anyone 18 and older to request care online for non-urgent symptoms. For details visit [email or web address].   Also download the MyChart app! Go to the app store, search "MyChart", open the app, select [facility], and log in with your MyChart username and password.  Due to Covid, a mask is required upon entering the hospital/clinic. If you do not have a mask, one will be given to you upon arrival. For doctor visits, patients may have 1 support person aged 18 or older with them. For treatment visits, patients cannot have anyone with them due to current Covid guidelines and our immunocompromised population.  

## 2022-02-01 NOTE — Telephone Encounter (Signed)
Refuse to refill request. Already been refilled on 01/20/22 #30. It is too early. ?

## 2022-02-01 NOTE — Progress Notes (Signed)
Labs drawn from port.  Patients port flushed without difficulty.  Good blood return noted with no bruising or swelling noted at site.  Band aid applied.  VSS with discharge and left in satisfactory condition with no s/s of distress noted.  

## 2022-02-02 LAB — CA 125: Cancer Antigen (CA) 125: 19 U/mL (ref 0.0–38.1)

## 2022-02-06 ENCOUNTER — Other Ambulatory Visit: Payer: Self-pay

## 2022-02-06 ENCOUNTER — Inpatient Hospital Stay (HOSPITAL_BASED_OUTPATIENT_CLINIC_OR_DEPARTMENT_OTHER): Payer: Medicare Other | Admitting: Hematology

## 2022-02-06 ENCOUNTER — Encounter (HOSPITAL_COMMUNITY): Payer: Self-pay | Admitting: Hematology

## 2022-02-06 VITALS — BP 161/80 | HR 74 | Temp 98.0°F | Ht 65.0 in | Wt 120.6 lb

## 2022-02-06 DIAGNOSIS — Z87891 Personal history of nicotine dependence: Secondary | ICD-10-CM | POA: Diagnosis not present

## 2022-02-06 DIAGNOSIS — D649 Anemia, unspecified: Secondary | ICD-10-CM | POA: Diagnosis not present

## 2022-02-06 DIAGNOSIS — C210 Malignant neoplasm of anus, unspecified: Secondary | ICD-10-CM

## 2022-02-06 DIAGNOSIS — Z85048 Personal history of other malignant neoplasm of rectum, rectosigmoid junction, and anus: Secondary | ICD-10-CM | POA: Diagnosis not present

## 2022-02-06 DIAGNOSIS — I4891 Unspecified atrial fibrillation: Secondary | ICD-10-CM | POA: Diagnosis not present

## 2022-02-06 DIAGNOSIS — Z923 Personal history of irradiation: Secondary | ICD-10-CM | POA: Diagnosis not present

## 2022-02-06 DIAGNOSIS — C569 Malignant neoplasm of unspecified ovary: Secondary | ICD-10-CM

## 2022-02-06 DIAGNOSIS — Z9221 Personal history of antineoplastic chemotherapy: Secondary | ICD-10-CM | POA: Diagnosis not present

## 2022-02-06 DIAGNOSIS — Z7901 Long term (current) use of anticoagulants: Secondary | ICD-10-CM | POA: Diagnosis not present

## 2022-02-06 DIAGNOSIS — R319 Hematuria, unspecified: Secondary | ICD-10-CM | POA: Diagnosis not present

## 2022-02-06 NOTE — Progress Notes (Signed)
? ?[facility] ?[address] ?[place], [place] [address] ? ? ?CLINIC:  ?Medical Oncology/Hematology ? ?PCP:  ?Susy Frizzle, MD ?[address] [place] [address] ?[phone] ? ? ?REASON FOR VISIT:  ?Follow-up for anal cancer and ovarian cancer ? ?PRIOR THERAPY:  ?1. Debulking surgery with BSO on 07/04/2014. ?2. Chemoradiation therapy with 5-FU and mitomycin completed on 12/06/2015. ? ?NGS Results: not done ? ?CURRENT THERAPY: surveillance ? ?BRIEF ONCOLOGIC HISTORY:  ?Oncology History  ?History of ovarian cancer  ?06/02/2014 Procedure  ? Diagnostic laparoscopy, lysis of adhesions, biopsy of omental nodules, biopsy right ovary by Dr Dalbert Batman ? ?  ?06/04/2014 Pathology Results  ? Diagnosis ?1. Omentum, biopsy, Anterior peritoneal & omental nodule ?- SEROUS CARCINOMA WITH ASSOCIATED ABUNDANT PSAMMOMA BODIES AND ?DESMOPLASTIC STROMAL REACTION CONSISTENT WITH INVASIVE IMPLANTS. ?- SEE COMMENT. ?2. Omentum, biopsy, Omental nodule ?- SEROUS CARCINOMA WITH ASSOCIATED ABUNDANT PSAMMOMA BODIES AND ?DESMOPLASTIC STROMAL REACTION CONSISTENT WITH INVASIVE IMPLANTS. ?- SEE COMMENT. ?3. Ovary, biopsy/wedge resection, Right ovary ?- SMALL FRAGMENTS OF ATYPICAL PAPILLARY PROLIFERATION WITH PSAMMOMA BODIES ?CONSISTENT WITH AT LEAST SEROUS BORDERLINE TUMOR. ? ?  ?07/03/2014 Procedure  ? Exploratory laparotomy, omentectomy, Bilateral salpingo-oophorectomy, inptraperitoneal PORT placement by Dr. Alycia Rossetti ? ? ? ? ?  ?07/24/2014 - 12/07/2014 Chemotherapy  ? Carboplatin/Paclitaxel x 6 cycles ? ? ?  ?08/17/2014 Procedure  ? Insertion of 8 French power port clear view tunneled venous vascular access device next line use of fluoroscopy for guidance and positioning ?Use of ultrasound for venipuncture ?Removal of intraperitoneal chemotherapy port ?By Dr. Dalbert Batman ? ?  ?05/14/2017 Genetic Testing  ? Negative genetic testing on the [facility] panel.  The [facility] gene panel offered by [employer] includes sequencing  and deletion/duplication testing of the following 28 genes: APC, ATM, BARD1, BMPR1A, BRCA1, BRCA2, BRIP1, CHD1, CDK4, CDKN2A, CHEK2, EPCAM (large rearrangement only), MLH1, MSH2, MSH6, MUTYH, NBN, PALB2, PMS2, PTEN, RAD51C, RAD51D, SMAD4, STK11, and TP53. Sequencing was performed for select regions of POLE and POLD1, and large rearrangement analysis was performed for select regions of GREM1. The report date is April 27, 2017. ? ?HRD testing was performed on the ovarian tumor.  The tumor was negative for any deleterious BRCA1 or BRCA2 mutations.    Genomic instability status was not interpretable.  Therefore, [facility] was unable to analyze the ovarian tumor for genomic instability.  The report date is May 14, 2017. ? ? ?  ?Anal cancer ([place])  ?09/09/2015 Procedure  ? Rectovaginal septal mass biopsy by Dr. Marcello Moores ? ?  ?09/10/2015 Pathology Results  ? Soft tissue mass, biopsy, rectovaginal septal mass ?- SQUAMOUS CELL CARCINOMA. ? ?  ?10/15/2015 PET scan  ? 1. Focal hypermetabolism with associated ill-defined soft tissue ?fullness at the junction of the anterior anal wall and posterior ?inferior vaginal wall, in keeping with the provided history of a ?primary anal carcinoma. ?2. No hypermetabolic locoregional or distant metastatic disease. ?3. Status post hysterectomy, with no abnormal findings at the ?vaginal cuff. No evidence of hypermetabolic peritoneal tumor. ?4. Tiny nonobstructing bilateral renal stones. ? ?  ?10/25/2015 - 12/06/2015 Radiation Therapy  ? [place], [place] ? ?  ?10/25/2015 - 12/06/2015 Chemotherapy  ? Mitomycin C and 5FU, by Dr. Jacquiline Doe ? ? ?  ?06/15/2016 Procedure  ? EXCISION RECTOVAGINAOL FISTULA WITH MUCOSAL ADVANCEMENT FLAP by Dr. Marcello Moores ? ?  ?06/18/2016 Pathology Results  ? Fistula, rectovaginal ?SQUAMOUS, COLUMNAR JUNCTION MUCOSA AND SUBCUTANOUS SOFT TISSUE WITH FISTULA ?NO EVIDENCE OF MALIGNANCY ? ?  ?  09/20/2016 Imaging  ? CT CAP- 1. No acute findings and no evidence for recurrent tumor  or ?metastatic disease. ?2. Aortic atherosclerosis and coronary artery calcification ?  ? ? ?CANCER STAGING: ? Cancer Staging  ?Anal cancer (HCC) ?Staging form: Anus, AJCC 8th Edition ?- Clinical: Stage IIA (cT2, cN0, cM0) - Signed by Ellouise Newer, PA-C on 09/27/2016 ? ? ?INTERVAL HISTORY:  ?[name], a 71 y.o. female, returns for routine follow-up of her anal cancer and ovarian cancer. Senna was last seen on 08/01/2021.  ? ?Today she reports feeling good. She reports hematuria. She is currently taking Eliquis for A-fib. She denies hematochezia. She is not currently taking iron tablets, and previously when she took iron tablets she reports she tolerated them well. She reports a fall a few days ago where she hit her left hip.   ? ?REVIEW OF SYSTEMS:  ?Review of Systems  ?Constitutional:  Negative for appetite change and fatigue.  ?Gastrointestinal:  Negative for blood in stool.  ?Genitourinary:  Positive for difficulty urinating and hematuria.   ?Musculoskeletal:  Positive for arthralgias (7/10 L hip).  ?Hematological:  Bruises/bleeds easily (bruising).  ?Psychiatric/Behavioral:  The patient is nervous/anxious.   ?All other systems reviewed and are negative. ? ?PAST MEDICAL/SURGICAL HISTORY:  ?Past Medical History:  ?Diagnosis Date  ? Allergy   ? Anal carcinoma ([facility])   ? Chemo and radiation  ? Anemia   ? Anxiety   ? Dr. Tenny Craw at [facility] (psychiatrist).    ? Blood transfusion without reported diagnosis 2017  ? Carotid artery disease ([facility])   ? Chronic kidney disease   ? DDD (degenerative disc disease), lumbosacral   ? GERD (gastroesophageal reflux disease)   ? Headache(784.0)   ? History of adenomatous polyp of colon 10/07/2015  ? Tubular adenoma high grade dysplasia  ? History of herpes genitalis   ? History of kidney stones   ? History of suicide attempt 2010  ? HTN (hypertension)   ? Hyperlipidemia   ? IBS (irritable bowel syndrome)   ? Major depression   ? OA (osteoarthritis)   ? Ovarian cancer (HCC)    ? Stage IIIB papillary ovarian carcinoma  s/p omentectomy and BSO & chemotherapy (completed 12-07-2014)  ? Prolapse of vaginal vault after hysterectomy 07/20/2015  ? Rectovaginal fistula 08/05/2015  ? Seasonal allergies   ? Sigmoid diverticulosis   ? Smokers' cough (HCC)   ? ?Past Surgical History:  ?Procedure Laterality Date  ? BIOPSY  10/12/2020  ? Procedure: BIOPSY;  Surgeon: Marguerita Merles, Reuel Boom, MD;  [facility];  Service: Gastroenterology;;  ? BREAST ENHANCEMENT SURGERY  1986  ? BREAST SURGERY  1986  ? CESAREAN SECTION  1978  ? COLONOSCOPY N/A 10/07/2015  ? Procedure: COLONOSCOPY;  Surgeon: Malissa Hippo, MD;  [facility];  Service: Endoscopy;  Laterality: N/A;  7:30  ? COLONOSCOPY WITH PROPOFOL N/A 10/12/2020  ? Procedure: COLONOSCOPY WITH PROPOFOL;  Surgeon: Dolores Frame, MD;  [facility];  Service: Gastroenterology;  Laterality: N/A;  9:00  ? COSMETIC SURGERY  1986  ? CYSTOSCOPY WITH FULGERATION N/A 11/03/2021  ? Procedure: CYSTOSCOPY WITH FULGERATION;  Surgeon: Malen Gauze, MD;  [facility];  Service: Urology;  Laterality: N/A;  ? ESOPHAGOGASTRODUODENOSCOPY (EGD) WITH PROPOFOL N/A 10/12/2020  ? Procedure: ESOPHAGOGASTRODUODENOSCOPY (EGD) WITH PROPOFOL;  Surgeon: Dolores Frame, MD;  [facility];  Service: Gastroenterology;  Laterality: N/A;  ? EXPLORATORY LAPAROTOMY/ OMENTECTOMY/  BILATERAL SALPINGOOPHORECTOMY/  PORT-A-CATH PLACEMENT  07-03-2014   [facility]  ? KNEE ARTHROSCOPY Left 09/22/2004  ? LAPAROSCOPY N/A 06/02/2014  ? Procedure: DIAGNOSTIC LAPAROSCOPY, OMENTAL BIOPSY, RIGHT OVARY BIOPSY, LYSIS OF ADHESIONS;  Surgeon: Fanny Skates, MD;  Location: [facility];  Service: General;  Laterality: N/A;  ? MUCOSAL ADVANCEMENT FLAP N/A 06/15/2016  ? Procedure: EXCISION RECTOVAGINAOL FISTULA WITH MUCOSAL ADVANCEMENT FLAP;  Surgeon: Leighton Ruff, MD;  Location: [facility];  Service: General;  Laterality:  N/A;  ? [place] N/A 09/09/2015  ? Procedure: PLACEMENT OF SETON;  Surgeon: Leighton Ruff, MD;  Location: [facility];  Service: General;  Laterality: N/A;  ? PORT-A-CATH REMOVAL Right

## 2022-02-06 NOTE — Patient Instructions (Addendum)
[facility] at [facility] ?Discharge Instructions ? ?You were seen and examined today by Dr. Delton Coombes. ? ?Dr. Delton Coombes discussed your most recent lab work and your calcium and hemoglobin is slightly low. Your iron is also low Dr. Delton Coombes wants you to start taking over the counter Iron one tablet daily and if you have constipation take a stool softener. All other labs are normal. ? ?Follow-up as scheduled in 6 months. ? ? ? ?Thank you for choosing [facility] to provide your oncology and hematology care.  To afford each patient quality time with our provider, please arrive at least 15 minutes before your scheduled appointment time.  ? ?If you have a lab appointment with the [facility] please come in thru the Main Entrance and check in at the main information desk. ? ?You need to re-schedule your appointment should you arrive 10 or more minutes late.  We strive to give you quality time with our providers, and arriving late affects you and other patients whose appointments are after yours.  Also, if you no show three or more times for appointments you may be dismissed from the clinic at the providers discretion.     ?Again, thank you for choosing [facility].  Our hope is that these requests will decrease the amount of time that you wait before being seen by our physicians.       ?_____________________________________________________________ ? ?Should you have questions after your visit to [facility], please contact our office at [phone] and follow the prompts.  Our office hours are 8:00 a.m. and 4:30 p.m. Monday - Friday.  Please note that voicemails left after 4:00 p.m. may not be returned until the following business day.  We are closed weekends and major holidays.  You do have access to a nurse 24-7, just call the main number to the clinic [phone] and do not press any options, hold on the line and a nurse  will answer the phone.   ? ?For prescription refill requests, have your pharmacy contact our office and allow 72 hours.   ? ?Due to Covid, you will need to wear a mask upon entering the hospital. If you do not have a mask, a mask will be given to you at the Main Entrance upon arrival. For doctor visits, patients may have 1 support person age 22 or older with them. For treatment visits, patients can not have anyone with them due to social distancing guidelines and our immunocompromised population.  ? ?  ?

## 2022-02-12 ENCOUNTER — Other Ambulatory Visit: Payer: Self-pay | Admitting: Family Medicine

## 2022-02-13 ENCOUNTER — Ambulatory Visit: Payer: Medicare Other | Admitting: Urology

## 2022-02-13 ENCOUNTER — Encounter: Payer: Self-pay | Admitting: Urology

## 2022-02-13 VITALS — BP 98/60 | HR 90

## 2022-02-13 DIAGNOSIS — R310 Gross hematuria: Secondary | ICD-10-CM | POA: Diagnosis not present

## 2022-02-13 LAB — URINALYSIS, ROUTINE W REFLEX MICROSCOPIC
Bilirubin, UA: NEGATIVE
Glucose, UA: NEGATIVE
Nitrite, UA: NEGATIVE
Specific Gravity, UA: 1.02 (ref 1.005–1.030)
Urobilinogen, Ur: 1 mg/dL (ref 0.2–1.0)
pH, UA: 7 (ref 5.0–7.5)

## 2022-02-13 MED ORDER — CIPROFLOXACIN HCL 500 MG PO TABS
500.0000 mg | ORAL_TABLET | Freq: Once | ORAL | Status: AC
  Administered 2022-02-13: 500 mg via ORAL

## 2022-02-13 MED ORDER — HYDROCODONE-ACETAMINOPHEN 7.5-325 MG PO TABS: 1.0000 | ORAL_TABLET | Freq: Four times a day (QID) | ORAL | 0 refills | Status: DC | PRN

## 2022-02-13 NOTE — Telephone Encounter (Signed)
Is this okay  to refill    01/27/22 - OV  Last filled 01/17/22  #30 Next OV 02/02/23

## 2022-02-13 NOTE — Progress Notes (Unsigned)
   02/13/22  CC: gross hematuria   HPI: Abigail Wagner is a 71yo here for followup for gross hematuria. She continues to have intermittent gross painless hematuria Blood pressure 98/60, pulse 90. NED. A&Ox3.   No respiratory distress   Abd soft, NT, ND Normal external genitalia with patent urethral meatus  Cystoscopy Procedure Note  Patient identification was confirmed, informed consent was obtained, and patient was prepped using Betadine solution.  Lidocaine jelly was administered per urethral meatus.    Procedure: - Flexible cystoscope introduced, without any difficulty.   - Thorough search of the bladder revealed:    normal urethral meatus    normal urothelium    no stones    no ulcers     1cm posterior wall sessile tumor with calcification    no urethral polyps    no trabeculation  - Ureteral orifices were normal in position and appearance.  Post-Procedure: - Patient tolerated the procedure well  Assessment/ Plan: Schedule for bladder biopsy. Risks/benefits/alternatives discussed   No follow-ups on file.  Nicolette Bang, MD

## 2022-02-13 NOTE — Patient Instructions (Signed)
Transurethral Resection of Bladder Tumor  Transurethral resection of a bladder tumor is the removal (resection) of cancerous tissue (tumor) from the inside wall of the bladder. The bladder is the organ that holds urine. The tumor is removed through the tube that carries urine out of the body (urethra). In a transurethral resection, a thin telescope with a light, a tiny camera, and an electric cutting edge (resectoscope) is passed through the urethra. In men, the opening of the urethra is at the end of the penis. In women, it is just above the opening of the vagina. Tell a health care provider about: Any allergies you have. All medicines you are taking, including vitamins, herbs, eye drops, creams, and over-the-counter medicines. Any problems you or family members have had with anesthetic medicines. Any bleeding problems you have. Any surgeries you have had. Any medical conditions you have, including recent urinary tract infections. Whether you are pregnant or may be pregnant. What are the risks? Generally, this is a safe procedure. However, problems may occur, including: Infection. Bleeding. Allergic reactions to medicines. Damage to nearby structures or organs. Difficulty urinating from blockage of the urethra or not being able to urinate (urinary retention). Deep vein thrombosis. This is a blood clot that can develop in your leg. Recurring cancer. What happens before the procedure? When to stop eating and drinking Follow instructions from your health care provider about what you may eat and drink before your procedure. These may include: 8 hours before your procedure Stop eating most foods. Do not eat meat, fried foods, or fatty foods. Eat only light foods, such as toast or crackers. All liquids are okay except energy drinks and alcohol. 6 hours before your procedure Stop eating. Drink only clear liquids, such as water, clear fruit juice, black coffee, plain tea, and sports  drinks. Do not drink energy drinks or alcohol. 2 hours before your procedure Stop drinking all liquids. You may be allowed to take medicines with small sips of water. Medicines Ask your health care provider about: Changing or stopping your regular medicines. This is especially important if you are taking diabetes medicines or blood thinners. Taking medicines such as aspirin and ibuprofen. These medicines can thin your blood. Do not take these medicines unless your health care provider tells you to take them. Taking over-the-counter medicines, vitamins, herbs, and supplements. General instructions If you will be going home right after the procedure, plan to have a responsible adult: Take you home from the hospital or clinic. You will not be allowed to drive. Care for you for the time you are told. Ask your health care provider what steps will be taken to help prevent infection. These steps may include: Washing skin with a germ-killing soap. Taking antibiotic medicine. Do not use any products that contain nicotine or tobacco for at least 4 weeks before the procedure. These products include cigarettes, chewing tobacco, and vaping devices, such as e-cigarettes. If you need help quitting, ask your health care provider. What happens during the procedure? An IV will be inserted into one of your veins. You will be given one or more of the following: A medicine to help you relax (sedative). A medicine that is injected into your spine to numb the area below and slightly above the injection site (spinal anesthetic). A medicine that is injected into an area of your body to numb everything below the injection site (regional anesthetic). A medicine to make you fall asleep (general anesthetic). Your legs will be placed in foot rests (  stirrups) to open your legs and bend your knees. The resectoscope will be passed through your urethra and into your bladder. The part of your bladder with the tumor will be  resected by the cutting edge of the resectoscope. Fluid will be passed to rinse out the cut tissues (irrigation). The resectoscope will then be taken out. A small, thin tube (catheter) will be passed through your urethra and into your bladder. The catheter will drain urine into a bag outside of your body. The procedure may vary among health care providers and hospitals. What happens after the procedure? Your blood pressure, heart rate, breathing rate, and blood oxygen level will be monitored until you leave the hospital or clinic. You may continue to receive fluids and medicines through an IV. You will be given pain medicine to relieve pain. You will have a catheter to drain your urine. The amount of urine will be measured. If you have blood in your urine, your bladder may be rinsed out by passing fluid through your catheter. You will be encouraged to walk as soon as you can. You may have to wear compression stockings. These stockings help to prevent blood clots and reduce swelling in your legs. If you were given a sedative during the procedure, it can affect you for several hours. Do not drive or operate machinery until your health care provider says that it is safe. Summary Transurethral resection of a bladder tumor is the removal (resection) of a cancerous growth (tumor) on the inside wall of the bladder. To do this procedure, your health care provider uses a thin telescope with a light, a tiny camera, and an electric cutting edge (resectoscope) that is guided to your bladder through your urethra. The part of your bladder that is affected by the tumor will be resected by the cutting edge of the resectoscope. A catheter will be passed through your urethra and into your bladder. The catheter will drain urine into a bag outside of your body. If you will be going home right after the procedure, plan to have a responsible adult take you home from the hospital or clinic. You will not be allowed to  drive. This information is not intended to replace advice given to you by your health care provider. Make sure you discuss any questions you have with your health care provider. Document Revised: 09/16/2021 Document Reviewed: 09/16/2021 [employer] Patient Education  2023 [employer]  

## 2022-02-13 NOTE — H&P (View-Only) (Signed)
   02/13/22  CC: gross hematuria   HPI: Ms Platner is a 71yo here for followup for gross hematuria. She continues to have intermittent gross painless hematuria Blood pressure 98/60, pulse 90. NED. A&Ox3.   No respiratory distress   Abd soft, NT, ND Normal external genitalia with patent urethral meatus  Cystoscopy Procedure Note  Patient identification was confirmed, informed consent was obtained, and patient was prepped using Betadine solution.  Lidocaine jelly was administered per urethral meatus.    Procedure: - Flexible cystoscope introduced, without any difficulty.   - Thorough search of the bladder revealed:    normal urethral meatus    normal urothelium    no stones    no ulcers     1cm posterior wall sessile tumor with calcification    no urethral polyps    no trabeculation  - Ureteral orifices were normal in position and appearance.  Post-Procedure: - Patient tolerated the procedure well  Assessment/ Plan: Schedule for bladder biopsy. Risks/benefits/alternatives discussed   No follow-ups on file.  Nicolette Bang, MD

## 2022-02-14 NOTE — Progress Notes (Signed)
I spoke with Ms. Alioto. We have discussed possible surgery dates and 02/27/2022 was agreed upon by all parties. Patient given information about surgery date, what to expect pre-operatively and post operatively.    We discussed that a pre-op nurse will be calling to set up the pre-op visit that will take place prior to surgery. Informed patient that our office will communicate any additional care to be provided after surgery.    Patients questions or concerns were discussed during our call. Advised to call our office should there be any additional information, questions or concerns that arise. Patient verbalized understanding.

## 2022-02-22 NOTE — Patient Instructions (Signed)
Abigail Wagner  02/22/2022     '@PREFPERIOPPHARMACY'$ @   Your procedure is scheduled on  02/27/2022.   Report to [facility] at  1000  A.M.   Call this number if you have problems the morning of surgery:  [phone]   Remember:  Do not eat or drink after midnight.      Take these medicines the morning of surgery with A SIP OF WATER            pacerone, depakote, lexapro, hydrocodone(If needed), ativan (if needed), omeprazole, zofran (if needed).    Do not wear jewelry, make-up or nail polish.  Do not wear lotions, powders, or perfumes, or deodorant.  Do not shave 48 hours prior to surgery.  Men may shave face and neck.  Do not bring valuables to the hospital.  [facility] is not responsible for any belongings or valuables.  Contacts, dentures or bridgework may not be worn into surgery.  Leave your suitcase in the car.  After surgery it may be brought to your room.  For patients admitted to the hospital, discharge time will be determined by your treatment team.  Patients discharged the day of surgery will not be allowed to drive home and must have someone with them for 24 hours.    Special instructions:   DO NOT smoke tobacco or vape for 24 hours before your procedure.  Please read over the following fact sheets that you were given. Coughing and Deep Breathing, Surgical Site Infection Prevention, Anesthesia Post-op Instructions, and Care and Recovery After Surgery      Transurethral Resection of Bladder Tumor, Care After The following information offers guidance on how to care for yourself after your procedure. Your health care provider may also give you more specific instructions. If you have problems or questions, contact your health care provider. What can I expect after the procedure? After the procedure, it is common to have: A small amount of blood or small blood clots in your urine for up to 2 weeks. Soreness or mild pain from your catheter. After your  catheter is removed, you may have mild soreness, especially when urinating. A need to urinate often. Pain in your lower abdomen. Follow these instructions at home: Medicines  Take over-the-counter and prescription medicines only as told by your health care provider. If you were prescribed an antibiotic medicine, take it as told by your health care provider. Do not stop taking the antibiotic even if you start to feel better. Ask your health care provider if the medicine prescribed to you: Requires you to avoid driving or using machinery. Can cause constipation. You may need to take these actions to prevent or treat constipation: Drink enough fluid to keep your urine pale yellow. Take over-the-counter or prescription medicines. Eat foods that are high in fiber, such as beans, whole grains, and fresh fruits and vegetables. Limit foods that are high in fat and processed sugars, such as fried or sweet foods. Activity  If you were given a sedative during the procedure, it can affect you for several hours. Do not drive or operate machinery until your health care provider says that it is safe. Rest as told by your health care provider. Avoid sitting for a long time without moving. Get up to take short walks every 1-2 hours. This is important to improve blood flow and breathing. Ask for help if you feel weak or unsteady. Do not lift anything that is heavier  than 10 lb (4.5 kg), or the limit that you are told, until your health care provider says that it is safe. Avoid intense physical activity for as long as told by your health care provider. Do not have sex until your health care provider approves. Return to your normal activities as told by your health care provider. Ask your health care provider what activities are safe for you. General instructions If you have a catheter, follow instructions from your health care provider about caring for your catheter and your drainage bag. Do not drink alcohol  for as long as told by your health care provider. This is especially important if you are taking prescription pain medicines. Do not use any products that contain nicotine or tobacco. These products include cigarettes, chewing tobacco, and vaping devices, such as e-cigarettes. If you need help quitting, ask your health care provider. Wear compression stockings as told by your health care provider. These stockings help to prevent blood clots and reduce swelling in your legs. Keep all follow-up visits. This is important. You will need to be followed closely with regular checks of your bladder and urethra (cystoscopies) to make sure that the cancer does not come back. Contact a health care provider if: You have blood in your urine for more than 2 weeks. You become constipated. Signs of constipation may include: Having fewer than three bowel movements in a week. Difficulty having a bowel movement. Stools that are dry, hard, or larger than normal. You have a urinary catheter in place, and you have: Spasms or pain. Problems with your catheter or your catheter is blocked. Your catheter has been taken out but you are unable to urinate. You have signs of infection, such as: Fever or chills. Cloudy or bad-smelling urine. Get help right away if: You have severe abdominal pain that gets worse or does not improve with medicine. You have a lot of large blood clots in your urine. You develop swelling or pain in your leg. You have difficulty breathing. These symptoms may be an emergency. Get help right away. Call 911. Do not wait to see if the symptoms will go away. Do not drive yourself to the hospital. Summary After your procedure, it is common to have a small amount of blood or small blood clots in your urine, soreness or mild pain from your catheter, and pain in your lower abdomen. Take over-the-counter and prescription medicines only as told by your health care provider. Rest as told by your health  care provider. Follow your health care provider's instructions about returning to normal activities. Ask what activities are safe for you. If you have a catheter, follow instructions from your health care provider about caring for your catheter and your drainage bag. This information is not intended to replace advice given to you by your health care provider. Make sure you discuss any questions you have with your health care provider. Document Revised: 09/16/2021 Document Reviewed: 09/16/2021 Elsevier Patient Education  [place] Anesthesia, Adult, Care After This sheet gives you information about how to care for yourself after your procedure. Your health care provider may also give you more specific instructions. If you have problems or questions, contact your health care provider. What can I expect after the procedure? After the procedure, the following side effects are common: Pain or discomfort at the IV site. Nausea. Vomiting. Sore throat. Trouble concentrating. Feeling cold or chills. Feeling weak or tired. Sleepiness and fatigue. Soreness and body aches. These side effects can  affect parts of the body that were not involved in surgery. Follow these instructions at home: For the time period you were told by your health care provider:  Rest. Do not participate in activities where you could fall or become injured. Do not drive or use machinery. Do not drink alcohol. Do not take sleeping pills or medicines that cause drowsiness. Do not make important decisions or sign legal documents. Do not take care of children on your own. Eating and drinking Follow any instructions from your health care provider about eating or drinking restrictions. When you feel hungry, start by eating small amounts of foods that are soft and easy to digest (bland), such as toast. Gradually return to your regular diet. Drink enough fluid to keep your urine pale yellow. If you vomit,  rehydrate by drinking water, juice, or clear broth. General instructions If you have sleep apnea, surgery and certain medicines can increase your risk for breathing problems. Follow instructions from your health care provider about wearing your sleep device: Anytime you are sleeping, including during daytime naps. While taking prescription pain medicines, sleeping medicines, or medicines that make you drowsy. Have a responsible adult stay with you for the time you are told. It is important to have someone help care for you until you are awake and alert. Return to your normal activities as told by your health care provider. Ask your health care provider what activities are safe for you. Take over-the-counter and prescription medicines only as told by your health care provider. If you smoke, do not smoke without supervision. Keep all follow-up visits as told by your health care provider. This is important. Contact a health care provider if: You have nausea or vomiting that does not get better with medicine. You cannot eat or drink without vomiting. You have pain that does not get better with medicine. You are unable to pass urine. You develop a skin rash. You have a fever. You have redness around your IV site that gets worse. Get help right away if: You have difficulty breathing. You have chest pain. You have blood in your urine or stool, or you vomit blood. Summary After the procedure, it is common to have a sore throat or nausea. It is also common to feel tired. Have a responsible adult stay with you for the time you are told. It is important to have someone help care for you until you are awake and alert. When you feel hungry, start by eating small amounts of foods that are soft and easy to digest (bland), such as toast. Gradually return to your regular diet. Drink enough fluid to keep your urine pale yellow. Return to your normal activities as told by your health care provider. Ask your  health care provider what activities are safe for you. This information is not intended to replace advice given to you by your health care provider. Make sure you discuss any questions you have with your health care provider. Document Revised: 05/27/2020 Document Reviewed: 12/25/2019 Elsevier Patient Education  [place]. How to Use Chlorhexidine for Bathing Chlorhexidine gluconate (CHG) is a germ-killing (antiseptic) solution that is used to clean the skin. It can get rid of the bacteria that normally live on the skin and can keep them away for about 24 hours. To clean your skin with CHG, you may be given: A CHG solution to use in the shower or as part of a sponge bath. A prepackaged cloth that contains CHG. Cleaning your skin with CHG  may help lower the risk for infection: While you are staying in the intensive care unit of the hospital. If you have a vascular access, such as a central line, to provide short-term or long-term access to your veins. If you have a catheter to drain urine from your bladder. If you are on a ventilator. A ventilator is a machine that helps you breathe by moving air in and out of your lungs. After surgery. What are the risks? Risks of using CHG include: A skin reaction. Hearing loss, if CHG gets in your ears and you have a perforated eardrum. Eye injury, if CHG gets in your eyes and is not rinsed out. The CHG product catching fire. Make sure that you avoid smoking and flames after applying CHG to your skin. Do not use CHG: If you have a chlorhexidine allergy or have previously reacted to chlorhexidine. On babies younger than 56 months of age. How to use CHG solution Use CHG only as told by your health care provider, and follow the instructions on the label. Use the full amount of CHG as directed. Usually, this is one bottle. During a shower Follow these steps when using CHG solution during a shower (unless your health care provider gives you different  instructions): Start the shower. Use your normal soap and shampoo to wash your face and hair. Turn off the shower or move out of the shower stream. Pour the CHG onto a clean washcloth. Do not use any type of brush or rough-edged sponge. Starting at your neck, lather your body down to your toes. Make sure you follow these instructions: If you will be having surgery, pay special attention to the part of your body where you will be having surgery. Scrub this area for at least 1 minute. Do not use CHG on your head or face. If the solution gets into your ears or eyes, rinse them well with water. Avoid your genital area. Avoid any areas of skin that have broken skin, cuts, or scrapes. Scrub your back and under your arms. Make sure to wash skin folds. Let the lather sit on your skin for 1-2 minutes or as long as told by your health care provider. Thoroughly rinse your entire body in the shower. Make sure that all body creases and crevices are rinsed well. Dry off with a clean towel. Do not put any substances on your body afterward--such as powder, lotion, or perfume--unless you are told to do so by your health care provider. Only use lotions that are recommended by the manufacturer. Put on clean clothes or pajamas. If it is the night before your surgery, sleep in clean sheets.  During a sponge bath Follow these steps when using CHG solution during a sponge bath (unless your health care provider gives you different instructions): Use your normal soap and shampoo to wash your face and hair. Pour the CHG onto a clean washcloth. Starting at your neck, lather your body down to your toes. Make sure you follow these instructions: If you will be having surgery, pay special attention to the part of your body where you will be having surgery. Scrub this area for at least 1 minute. Do not use CHG on your head or face. If the solution gets into your ears or eyes, rinse them well with water. Avoid your genital  area. Avoid any areas of skin that have broken skin, cuts, or scrapes. Scrub your back and under your arms. Make sure to wash skin folds. Let the lather  sit on your skin for 1-2 minutes or as long as told by your health care provider. Using a different clean, wet washcloth, thoroughly rinse your entire body. Make sure that all body creases and crevices are rinsed well. Dry off with a clean towel. Do not put any substances on your body afterward--such as powder, lotion, or perfume--unless you are told to do so by your health care provider. Only use lotions that are recommended by the manufacturer. Put on clean clothes or pajamas. If it is the night before your surgery, sleep in clean sheets. How to use CHG prepackaged cloths Only use CHG cloths as told by your health care provider, and follow the instructions on the label. Use the CHG cloth on clean, dry skin. Do not use the CHG cloth on your head or face unless your health care provider tells you to. When washing with the CHG cloth: Avoid your genital area. Avoid any areas of skin that have broken skin, cuts, or scrapes. Before surgery Follow these steps when using a CHG cloth to clean before surgery (unless your health care provider gives you different instructions): Using the CHG cloth, vigorously scrub the part of your body where you will be having surgery. Scrub using a back-and-forth motion for 3 minutes. The area on your body should be completely wet with CHG when you are done scrubbing. Do not rinse. Discard the cloth and let the area air-dry. Do not put any substances on the area afterward, such as powder, lotion, or perfume. Put on clean clothes or pajamas. If it is the night before your surgery, sleep in clean sheets.  For general bathing Follow these steps when using CHG cloths for general bathing (unless your health care provider gives you different instructions). Use a separate CHG cloth for each area of your body. Make sure you  wash between any folds of skin and between your fingers and toes. Wash your body in the following order, switching to a new cloth after each step: The front of your neck, shoulders, and chest. Both of your arms, under your arms, and your hands. Your stomach and groin area, avoiding the genitals. Your right leg and foot. Your left leg and foot. The back of your neck, your back, and your buttocks. Do not rinse. Discard the cloth and let the area air-dry. Do not put any substances on your body afterward--such as powder, lotion, or perfume--unless you are told to do so by your health care provider. Only use lotions that are recommended by the manufacturer. Put on clean clothes or pajamas. Contact a health care provider if: Your skin gets irritated after scrubbing. You have questions about using your solution or cloth. You swallow any chlorhexidine. Call your local poison control center (1-254-227-7977 in the [place] Get help right away if: Your eyes itch badly, or they become very red or swollen. Your skin itches badly and is red or swollen. Your hearing changes. You have trouble seeing. You have swelling or tingling in your mouth or throat. You have trouble breathing. These symptoms may represent a serious problem that is an emergency. Do not wait to see if the symptoms will go away. Get medical help right away. Call your local emergency services (911 in the [place] Do not drive yourself to the hospital. Summary Chlorhexidine gluconate (CHG) is a germ-killing (antiseptic) solution that is used to clean the skin. Cleaning your skin with CHG may help to lower your risk for infection. You may be given CHG to use  for bathing. It may be in a bottle or in a prepackaged cloth to use on your skin. Carefully follow your health care provider's instructions and the instructions on the product label. Do not use CHG if you have a chlorhexidine allergy. Contact your health care provider if your skin gets  irritated after scrubbing. This information is not intended to replace advice given to you by your health care provider. Make sure you discuss any questions you have with your health care provider. Document Revised: 11/22/2020 Document Reviewed: 11/22/2020 Elsevier Patient Education  [place].

## 2022-02-23 ENCOUNTER — Encounter (HOSPITAL_COMMUNITY)
Admission: RE | Admit: 2022-02-23 | Discharge: 2022-02-23 | Disposition: A | Discharge status: Home / Self Care | Payer: Medicare Other | Source: Ambulatory Visit | Attending: Urology | Admitting: Urology

## 2022-02-24 ENCOUNTER — Other Ambulatory Visit: Payer: Self-pay | Admitting: Family Medicine

## 2022-02-24 MED ORDER — HYDROCODONE-ACETAMINOPHEN 7.5-325 MG PO TABS: 1.0000 | ORAL_TABLET | Freq: Four times a day (QID) | ORAL | 0 refills | Status: DC | PRN

## 2022-02-24 NOTE — Telephone Encounter (Signed)
You have already fill this , I'm not able to refuse this medication

## 2022-02-25 ENCOUNTER — Encounter: Payer: Self-pay | Admitting: Family Medicine

## 2022-02-27 ENCOUNTER — Ambulatory Visit (HOSPITAL_BASED_OUTPATIENT_CLINIC_OR_DEPARTMENT_OTHER): Payer: Medicare Other | Admitting: Anesthesiology

## 2022-02-27 ENCOUNTER — Ambulatory Visit (HOSPITAL_COMMUNITY)
Admission: RE | Admit: 2022-02-27 | Discharge: 2022-02-27 | Disposition: A | Discharge status: Home / Self Care | Payer: Medicare Other | Attending: Urology | Admitting: Urology

## 2022-02-27 ENCOUNTER — Ambulatory Visit (HOSPITAL_COMMUNITY): Payer: Medicare Other | Admitting: Anesthesiology

## 2022-02-27 ENCOUNTER — Encounter (HOSPITAL_COMMUNITY): Payer: Self-pay | Admitting: Urology

## 2022-02-27 ENCOUNTER — Encounter (HOSPITAL_COMMUNITY)
Admission: RE | Disposition: A | Discharge status: Home / Self Care | Payer: Self-pay | Source: Home / Self Care | Attending: Urology

## 2022-02-27 DIAGNOSIS — D494 Neoplasm of unspecified behavior of bladder: Secondary | ICD-10-CM | POA: Diagnosis not present

## 2022-02-27 DIAGNOSIS — F319 Bipolar disorder, unspecified: Secondary | ICD-10-CM

## 2022-02-27 DIAGNOSIS — D414 Neoplasm of uncertain behavior of bladder: Secondary | ICD-10-CM | POA: Diagnosis not present

## 2022-02-27 DIAGNOSIS — I10 Essential (primary) hypertension: Secondary | ICD-10-CM | POA: Diagnosis not present

## 2022-02-27 DIAGNOSIS — Z87891 Personal history of nicotine dependence: Secondary | ICD-10-CM | POA: Insufficient documentation

## 2022-02-27 DIAGNOSIS — D630 Anemia in neoplastic disease: Secondary | ICD-10-CM

## 2022-02-27 DIAGNOSIS — D303 Benign neoplasm of bladder: Secondary | ICD-10-CM | POA: Insufficient documentation

## 2022-02-27 HISTORY — PX: CYSTOSCOPY: SHX5120

## 2022-02-27 HISTORY — PX: TRANSURETHRAL RESECTION OF BLADDER TUMOR: SHX2575

## 2022-02-27 SURGERY — CYSTOSCOPY
Anesthesia: General | Site: Ureter

## 2022-02-27 MED ORDER — ROCURONIUM BROMIDE 10 MG/ML (PF) SYRINGE
PREFILLED_SYRINGE | INTRAVENOUS | Status: DC | PRN
  Administered 2022-02-27: 50 mg via INTRAVENOUS

## 2022-02-27 MED ORDER — PROPOFOL 10 MG/ML IV BOLUS
INTRAVENOUS | Status: AC
  Filled 2022-02-27: qty 20

## 2022-02-27 MED ORDER — LACTATED RINGERS IV SOLN: INTRAVENOUS | Status: DC | PRN

## 2022-02-27 MED ORDER — LACTATED RINGERS IV SOLN
INTRAVENOUS | Status: DC
  Administered 2022-02-27: 1000 mL via INTRAVENOUS

## 2022-02-27 MED ORDER — FENTANYL CITRATE PF 50 MCG/ML IJ SOSY: 25.0000 ug | PREFILLED_SYRINGE | INTRAMUSCULAR | Status: DC | PRN

## 2022-02-27 MED ORDER — STERILE WATER FOR IRRIGATION IR SOLN
Status: DC | PRN
  Administered 2022-02-27: 1000 mL

## 2022-02-27 MED ORDER — SODIUM CHLORIDE 0.9 % IR SOLN
Status: DC | PRN
  Administered 2022-02-27 (×2): 3000 mL

## 2022-02-27 MED ORDER — FENTANYL CITRATE (PF) 100 MCG/2ML IJ SOLN
INTRAMUSCULAR | Status: DC | PRN
  Administered 2022-02-27: 50 ug via INTRAVENOUS

## 2022-02-27 MED ORDER — FENTANYL CITRATE (PF) 100 MCG/2ML IJ SOLN
INTRAMUSCULAR | Status: AC
  Filled 2022-02-27: qty 2

## 2022-02-27 MED ORDER — ONDANSETRON HCL 4 MG/2ML IJ SOLN
INTRAMUSCULAR | Status: DC | PRN
  Administered 2022-02-27: 4 mg via INTRAVENOUS

## 2022-02-27 MED ORDER — CEFAZOLIN SODIUM-DEXTROSE 2-4 GM/100ML-% IV SOLN
INTRAVENOUS | Status: AC
  Filled 2022-02-27: qty 100

## 2022-02-27 MED ORDER — ONDANSETRON HCL 4 MG/2ML IJ SOLN: 4.0000 mg | Freq: Once | INTRAMUSCULAR | Status: DC | PRN

## 2022-02-27 MED ORDER — LIDOCAINE HCL (PF) 2 % IJ SOLN
INTRAMUSCULAR | Status: AC
  Filled 2022-02-27: qty 5

## 2022-02-27 MED ORDER — CHLORHEXIDINE GLUCONATE 0.12 % MT SOLN
15.0000 mL | Freq: Once | OROMUCOSAL | Status: AC
  Administered 2022-02-27: 15 mL via OROMUCOSAL

## 2022-02-27 MED ORDER — ONDANSETRON HCL 4 MG/2ML IJ SOLN
INTRAMUSCULAR | Status: AC
  Filled 2022-02-27: qty 4

## 2022-02-27 MED ORDER — ROCURONIUM BROMIDE 10 MG/ML (PF) SYRINGE
PREFILLED_SYRINGE | INTRAVENOUS | Status: AC
  Filled 2022-02-27: qty 10

## 2022-02-27 MED ORDER — HYDROCODONE-ACETAMINOPHEN 7.5-325 MG PO TABS: 1.0000 | ORAL_TABLET | Freq: Four times a day (QID) | ORAL | 0 refills | Status: DC | PRN

## 2022-02-27 MED ORDER — SUGAMMADEX SODIUM 200 MG/2ML IV SOLN
INTRAVENOUS | Status: DC | PRN
  Administered 2022-02-27: 200 mg via INTRAVENOUS

## 2022-02-27 MED ORDER — PROPOFOL 10 MG/ML IV BOLUS
INTRAVENOUS | Status: DC | PRN
  Administered 2022-02-27: 50 mg via INTRAVENOUS
  Administered 2022-02-27: 100 mg via INTRAVENOUS
  Administered 2022-02-27: 50 mg via INTRAVENOUS

## 2022-02-27 MED ORDER — CEFAZOLIN SODIUM-DEXTROSE 2-4 GM/100ML-% IV SOLN
2.0000 g | INTRAVENOUS | Status: AC
  Administered 2022-02-27: 2 g via INTRAVENOUS

## 2022-02-27 MED ORDER — MIDAZOLAM HCL 2 MG/2ML IJ SOLN
INTRAMUSCULAR | Status: AC
  Filled 2022-02-27: qty 2

## 2022-02-27 MED ORDER — ORAL CARE MOUTH RINSE: 15.0000 mL | Freq: Once | OROMUCOSAL | Status: AC

## 2022-02-27 MED ORDER — LIDOCAINE HCL URETHRAL/MUCOSAL 2 % EX GEL
CUTANEOUS | Status: DC | PRN
  Administered 2022-02-27: 60 via TOPICAL

## 2022-02-27 SURGICAL SUPPLY — 29 items
BAG DRAIN URO TABLE W/ADPT NS (BAG) ×3 IMPLANT
BAG DRN 8 ADPR NS SKTRN CSTL (BAG) ×2
BAG DRN RND TRDRP ANRFLXCHMBR (UROLOGICAL SUPPLIES) ×2
BAG HAMPER (MISCELLANEOUS) ×3 IMPLANT
BAG URINE DRAIN 2000ML AR STRL (UROLOGICAL SUPPLIES) ×3 IMPLANT
CATH FOLEY LATEX FREE 22FR (CATHETERS) ×3
CATH FOLEY LF 22FR (CATHETERS) IMPLANT
CLOTH BEACON ORANGE TIMEOUT ST (SAFETY) ×3 IMPLANT
ELECT LOOP 22F BIPOLAR SML (ELECTROSURGICAL) ×3
ELECTRODE LOOP 22F BIPOLAR SML (ELECTROSURGICAL) ×2 IMPLANT
GLOVE BIO SURGEON STRL SZ8 (GLOVE) ×3 IMPLANT
GLOVE BIOGEL PI IND STRL 6.5 (GLOVE) IMPLANT
GLOVE BIOGEL PI IND STRL 7.0 (GLOVE) ×4 IMPLANT
GLOVE BIOGEL PI INDICATOR 6.5 (GLOVE) ×1
GLOVE BIOGEL PI INDICATOR 7.0 (GLOVE) ×1
GLOVE SS BIOGEL STRL SZ 6.5 (GLOVE) IMPLANT
GLOVE SUPERSENSE BIOGEL SZ 6.5 (GLOVE) ×1
GOWN STRL REUS W/TWL LRG LVL3 (GOWN DISPOSABLE) ×6 IMPLANT
GOWN STRL REUS W/TWL XL LVL3 (GOWN DISPOSABLE) ×3 IMPLANT
IV NS IRRIG 3000ML ARTHROMATIC (IV SOLUTION) ×6 IMPLANT
KIT TURNOVER CYSTO (KITS) ×3 IMPLANT
MANIFOLD NEPTUNE II (INSTRUMENTS) ×3 IMPLANT
PACK CYSTO (CUSTOM PROCEDURE TRAY) ×3 IMPLANT
PAD ARMBOARD 7.5X6 YLW CONV (MISCELLANEOUS) ×3 IMPLANT
SYR 30ML LL (SYRINGE) ×1 IMPLANT
SYR TOOMEY IRRIG 70ML (MISCELLANEOUS) ×3
SYRINGE TOOMEY IRRIG 70ML (MISCELLANEOUS) IMPLANT
TOWEL NATURAL 4PK STERILE (DISPOSABLE) ×3 IMPLANT
WATER STERILE IRR 500ML POUR (IV SOLUTION) ×3 IMPLANT

## 2022-02-27 NOTE — Anesthesia Procedure Notes (Signed)
Procedure Name: Intubation Date/Time: 02/27/2022 12:20 PM Performed by: Louann Sjogren, MD Pre-anesthesia Checklist: Patient identified, Emergency Drugs available, Suction available, Patient being monitored and Timeout performed Patient Re-evaluated:Patient Re-evaluated prior to induction Oxygen Delivery Method: Circle system utilized Preoxygenation: Pre-oxygenation with 100% oxygen Induction Type: IV induction Ventilation: Mask ventilation without difficulty Laryngoscope Size: Miller and 2 Grade View: Grade I Tube type: Oral Tube size: 7.0 mm Number of attempts: 1 Airway Equipment and Method: Stylet Placement Confirmation: ETT inserted through vocal cords under direct vision, positive ETCO2 and breath sounds checked- equal and bilateral Dental Injury: Teeth and Oropharynx as per pre-operative assessment

## 2022-02-27 NOTE — Anesthesia Preprocedure Evaluation (Signed)
Anesthesia Evaluation  Patient identified by MRN, date of birth, ID band Patient awake    Reviewed: Allergy & Precautions, H&P , NPO status , Patient's Chart, lab work & pertinent test results, reviewed documented beta blocker date and time   Airway Mallampati: II  TM Distance: >3 FB Neck ROM: full    Dental no notable dental hx.    Pulmonary neg pulmonary ROS, former smoker,    Pulmonary exam normal breath sounds clear to auscultation       Cardiovascular Exercise Tolerance: Good hypertension, negative cardio ROS   Rhythm:regular Rate:Normal     Neuro/Psych  Headaches, PSYCHIATRIC DISORDERS Anxiety Depression Bipolar Disorder    GI/Hepatic Neg liver ROS, GERD  Medicated,  Endo/Other  negative endocrine ROS  Renal/GU Renal disease  negative genitourinary   Musculoskeletal   Abdominal   Peds  Hematology  (+) Blood dyscrasia, anemia ,   Anesthesia Other Findings   Reproductive/Obstetrics negative OB ROS                             Anesthesia Physical  Anesthesia Plan  ASA: 3  Anesthesia Plan: General and General LMA   Post-op Pain Management:    Induction:   PONV Risk Score and Plan: Ondansetron  Airway Management Planned:   Additional Equipment:   Intra-op Plan:   Post-operative Plan:   Informed Consent: I have reviewed the patients History and Physical, chart, labs and discussed the procedure including the risks, benefits and alternatives for the proposed anesthesia with the patient or authorized representative who has indicated his/her understanding and acceptance.     Dental Advisory Given  Plan Discussed with: CRNA  Anesthesia Plan Comments:         Anesthesia Quick Evaluation

## 2022-02-27 NOTE — Interval H&P Note (Signed)
History and Physical Interval Note:  02/27/2022 11:52 AM  Abigail Wagner  has presented today for surgery, with the diagnosis of bladder tumor.  The various methods of treatment have been discussed with the patient and family. After consideration of risks, benefits and other options for treatment, the patient has consented to  Procedure(s): CYSTOSCOPY (N/A) TRANSURETHRAL RESECTION OF BLADDER TUMOR (TURBT) (N/A) as a surgical intervention.  The patient's history has been reviewed, patient examined, no change in status, stable for surgery.  I have reviewed the patient's chart and labs.  Questions were answered to the patient's satisfaction.     Nicolette Bang

## 2022-02-27 NOTE — Transfer of Care (Signed)
Immediate Anesthesia Transfer of Care Note  Patient: Abigail Wagner  Procedure(s) Performed: CYSTOSCOPY ([place]) TRANSURETHRAL RESECTION OF BLADDER TUMOR (TURBT) (Bladder)  Patient Location: PACU  Anesthesia Type:General  Level of Consciousness: awake  Airway & Oxygen Therapy: Patient Spontanous Breathing and Patient connected to nasal cannula oxygen  Post-op Assessment: Report given to RN and Post -op Vital signs reviewed and stable  Post vital signs: Reviewed and stable  Last Vitals:  Vitals Value Taken Time  BP 150/76   Temp    Pulse 79 02/27/22 1300  Resp 21 02/27/22 1300  SpO2 97% 02/27/22 1300  Vitals shown include unvalidated device data.  Last Pain:  Vitals:   02/27/22 1032  TempSrc: Oral  PainSc: 0-No pain      Patients Stated Pain Goal: 4 (09/81/19 1478)  Complications: No notable events documented.

## 2022-02-27 NOTE — Op Note (Signed)
.  Preoperative diagnosis: bladder tumor  Postoperative diagnosis: Same  Procedure: 1 cystoscopy 2. Transurethral resection of bladder tumor, small  Attending: Rosie Fate  Anesthesia: General  Estimated blood loss: Minimal  Drains: 22 French foley  Specimens: bladder tumor  Antibiotics: ancef  Findings:  1cm papillary posterior  wall tumor.  Ureteral orifices in normal anatomic location.   Indications: Patient is a 71 year old female with a history of bladder tumor and gross hematuria.  After discussing treatment options, they decided proceed with transurethral resection of a bladder tumor.  Procedure in detail: The patient was brought to the operating room and a brief timeout was done to ensure correct patient, correct procedure, correct site.  General anesthesia was administered patient was placed in dorsal lithotomy position.  Their genitalia was then prepped and draped in usual sterile fashion.  A rigid 71 French cystoscope was passed in the urethra and the bladder.  Bladder was inspected and we noted a 1cm bladder tumor.  the ureteral orifices were in the normal orthotopic locations.  Using the bipolar resectoscope we removed the bladder tumor down to the base. A subsequent muscle deep biopsy was then taken. Hemostasis was then obtained with electrocautery. We then removed the bladder tumor chips and sent them for pathology. We then re-inspected the bladder and found no residula bleeding.  the bladder was then drained, a 22 French foley was placed and this concluded the procedure which was well tolerated by patient.  Complications: None  Condition: Stable, extubated, transferred to PACU  Plan: Patient is to be discharged home and followup in 5 days for foley catheter removal and pathology discussion.

## 2022-02-28 ENCOUNTER — Telehealth: Payer: Self-pay

## 2022-02-28 ENCOUNTER — Other Ambulatory Visit: Payer: Self-pay | Admitting: Physician Assistant

## 2022-02-28 MED ORDER — OXYCODONE-ACETAMINOPHEN 5-325 MG PO TABS: 1.0000 | ORAL_TABLET | Freq: Four times a day (QID) | ORAL | 0 refills | Status: DC | PRN

## 2022-02-28 MED ORDER — SENNOSIDES-DOCUSATE SODIUM 8.6-50 MG PO TABS: 1.0000 | ORAL_TABLET | Freq: Every day | ORAL | 0 refills | Status: DC

## 2022-02-28 NOTE — Progress Notes (Signed)
Hydrocodone- Producer, television/film/video. None at local pharmacies. Oxycodone and Senakot S Rx for post op pain

## 2022-02-28 NOTE — Anesthesia Postprocedure Evaluation (Signed)
Anesthesia Post Note  Patient: Abigail Wagner  Procedure(s) Performed: CYSTOSCOPY ([place]) TRANSURETHRAL RESECTION OF BLADDER TUMOR (TURBT) (Bladder)  Patient location during evaluation: Phase II Anesthesia Type: General Level of consciousness: awake Pain management: pain level controlled Vital Signs Assessment: post-procedure vital signs reviewed and stable Respiratory status: spontaneous breathing and respiratory function stable Cardiovascular status: blood pressure returned to baseline and stable Postop Assessment: no headache and no apparent nausea or vomiting Anesthetic complications: no Comments: Late entry   No notable events documented.   Last Vitals:  Vitals:   02/27/22 1324 02/27/22 1334  BP: 129/74 (!) 144/80  Pulse: 70 71  Resp: 19 18  Temp:  36.7 C  SpO2: 95% 93%    Last Pain:  Vitals:   02/27/22 1334  TempSrc: Oral  PainSc: [place]

## 2022-02-28 NOTE — Telephone Encounter (Signed)
Husband called office this am.  Patient pharmacy is out of stock of the hydrocodone but does have oxycodone in stock.  Message sent to MD

## 2022-03-01 ENCOUNTER — Other Ambulatory Visit: Payer: Self-pay | Admitting: Urology

## 2022-03-01 ENCOUNTER — Encounter (HOSPITAL_COMMUNITY): Payer: Self-pay | Admitting: Urology

## 2022-03-01 LAB — SURGICAL PATHOLOGY

## 2022-03-03 ENCOUNTER — Ambulatory Visit: Payer: Medicare Other | Admitting: Physician Assistant

## 2022-03-03 VITALS — BP 109/50 | HR 83 | Ht 65.0 in | Wt 120.0 lb

## 2022-03-03 DIAGNOSIS — Z9889 Other specified postprocedural states: Secondary | ICD-10-CM

## 2022-03-03 DIAGNOSIS — R310 Gross hematuria: Secondary | ICD-10-CM

## 2022-03-03 DIAGNOSIS — Z8739 Personal history of other diseases of the musculoskeletal system and connective tissue: Secondary | ICD-10-CM

## 2022-03-03 DIAGNOSIS — Z8603 Personal history of neoplasm of uncertain behavior: Secondary | ICD-10-CM

## 2022-03-03 DIAGNOSIS — N952 Postmenopausal atrophic vaginitis: Secondary | ICD-10-CM

## 2022-03-03 MED ORDER — ESTRADIOL 0.1 MG/GM VA CREA: TOPICAL_CREAM | VAGINAL | 12 refills | Status: AC

## 2022-03-03 NOTE — Progress Notes (Signed)
Unable to complete voiding trial due to bladder spasms.  Verbal orders from Dr. Alyson Ingles to remove catheter.  Patient aware if she is unable to void to return to office for a bladder scan.  Patient and husband voiced understanding.   /Catheter Removal  Patient is present today for a catheter removal.  63m of water was drained from the balloon. A 22FR foley cath was removed from the bladder no complications were noted . Patient tolerated well.  Performed by: KLevi Aland CMA  Follow up/ Additional notes: Follow up as scheduled.

## 2022-03-03 NOTE — Progress Notes (Signed)
Assessment: 1. Gross hematuria  2. History of transurethral resection of bladder tumor (TURBT)  3. Atrophic vaginitis  4. History of chronic back pain    Plan: Patient passed voiding trial and was able to void on her own even though spasms prevented any instillation of sterile saline.  Path report discussed with the patient and her husband.  She is reassured that no further intervention is indicated at this time except monitoring.  Follow-up in 3 months for cystoscopic exam with Dr. Alyson Ingles.  Discussed diagnosis of atrophic vaginitis and recommendations for Estrace cream.  Prescription sent to her pharmacy for twice weekly application.  She is advised to take Tylenol intermittently throughout the day in order to decrease the amount of oxycodone she is taking.  She has history of chronic back pain for which she takes hydrocodone 7.5 mg which is unavailable nationally at this time.  For this pain, she is advised to follow-up with her primary care provider for other chronic pain treatment options.  Chief Complaint: No chief complaint on file.   HPI: Abigail Wagner is a 71 y.o. female who presents for postop voiding trial after TURBT performed on 02/27/2022.  Pain is being controlled with oxycodone as hydrocodone has been on national back order.  She is taking it twice a day.  Gross hematuria persists, but is slowly clearing.  Path report indicates early small urothelial papilloma.  Patient also noted to have atrophic vaginitis with stenotic urethra.  Dr. Alyson Ingles recommends estrogen cream.  She has resumed Eliquis.  Portions of the above documentation were copied from a prior visit for review purposes only.  Allergies: Allergies  Allergen Reactions   Morphine And Related     Delirium with sbo    PMH: Past Medical History:  Diagnosis Date   Allergy    Anal carcinoma ([place])    Chemo and radiation   Anemia    Anxiety    Dr. Harrington Challenger at [place] (psychiatrist).     Blood transfusion  without reported diagnosis 2017   Carotid artery disease (HCC)    Chronic kidney disease    DDD (degenerative disc disease), lumbosacral    GERD (gastroesophageal reflux disease)    Headache(784.0)    History of adenomatous polyp of colon 10/07/2015   Tubular adenoma high grade dysplasia   History of herpes genitalis    History of kidney stones    History of suicide attempt 2010   HTN (hypertension)    Hyperlipidemia    IBS (irritable bowel syndrome)    Major depression    OA (osteoarthritis)    Ovarian cancer ([place])    Stage IIIB papillary ovarian carcinoma  s/p omentectomy and BSO & chemotherapy (completed 12-07-2014)   Prolapse of vaginal vault after hysterectomy 07/20/2015   Rectovaginal fistula 08/05/2015   Seasonal allergies    Sigmoid diverticulosis    Smokers' cough ([place])     PSH: Past Surgical History:  Procedure Laterality Date   BIOPSY  10/12/2020   Procedure: BIOPSY;  Surgeon: Harvel Quale, MD;  [facility];  Service: Gastroenterology;;   [place]   COLONOSCOPY N/A 10/07/2015   Procedure: COLONOSCOPY;  Surgeon: Rogene Houston, MD;  [facility];  Service: Endoscopy;  Laterality: N/A;  7:30   COLONOSCOPY WITH PROPOFOL N/A 10/12/2020   Procedure: COLONOSCOPY WITH PROPOFOL;  Surgeon: Harvel Quale, MD;  [facility];  Service: Gastroenterology;  Laterality: N/A;  9:00   [place] N/A 02/27/2022   Procedure: CYSTOSCOPY;  Surgeon: Cleon Gustin, MD;  [facility];  Service: Urology;  Laterality: N/A;   CYSTOSCOPY WITH FULGERATION N/A 11/03/2021   Procedure: CYSTOSCOPY WITH FULGERATION;  Surgeon: Cleon Gustin, MD;  [facility];  Service: Urology;  Laterality: N/A;   ESOPHAGOGASTRODUODENOSCOPY (EGD) WITH PROPOFOL N/A 10/12/2020   Procedure: ESOPHAGOGASTRODUODENOSCOPY (EGD) WITH PROPOFOL;   Surgeon: Harvel Quale, MD;  [facility];  Service: Gastroenterology;  Laterality: N/A;   EXPLORATORY LAPAROTOMY/ OMENTECTOMY/  BILATERAL SALPINGOOPHORECTOMY/  PORT-A-CATH PLACEMENT  07-03-2014   [place]   KNEE ARTHROSCOPY Left 09/22/2004   LAPAROSCOPY N/A 06/02/2014   Procedure: DIAGNOSTIC LAPAROSCOPY, OMENTAL BIOPSY, RIGHT OVARY BIOPSY, LYSIS OF ADHESIONS;  Surgeon: Fanny Skates, MD;  Location: [place];  Service: General;  Laterality: N/A;   MUCOSAL ADVANCEMENT FLAP N/A 06/15/2016   Procedure: EXCISION RECTOVAGINAOL FISTULA WITH MUCOSAL ADVANCEMENT FLAP;  Surgeon: Leighton Ruff, MD;  Location: [place];  Service: General;  Laterality: N/A;   [place] N/A 09/09/2015   Procedure: PLACEMENT OF SETON;  Surgeon: Leighton Ruff, MD;  Location: [facility];  Service: General;  Laterality: N/A;   PORT-A-CATH REMOVAL Right 08/17/2014   Procedure: REMOVAL INTRAPERITONEAL CHEMO PORT;  Surgeon: Fanny Skates, MD;  Location: [place];  Service: General;  Laterality: Right;   PORTACATH PLACEMENT Right 08/17/2014   Procedure:  PLACE NEW PORT A CATH;  Surgeon: Fanny Skates, MD;  Location: [place];  Service: General;  Laterality: Right;   RECTAL BIOPSY N/A 09/09/2015   Procedure: BIOPSY OF RECTOVAGINAL MASS;  Surgeon: Leighton Ruff, MD;  Location: [place];  Service: General;  Laterality: N/A;   TRANSURETHRAL RESECTION OF BLADDER TUMOR N/A 02/27/2022   Procedure: TRANSURETHRAL RESECTION OF BLADDER TUMOR (TURBT);  Surgeon: Cleon Gustin, MD;  [facility];  Service: Urology;  Laterality: N/A;   TUBAL LIGATION  YRS AGO   VAGINAL HYSTERECTOMY  1981   fibroids    SH: Social History   Tobacco Use   Smoking status: Former    Packs/day: 0.50    Years: 15.00    Total pack years: 7.50    Types: Cigarettes    Quit date: 02/16/2018    Years since quitting: 4.0   Smokeless  tobacco: Never   Tobacco comments:    half pack a day for 15 yrs  Vaping Use   Vaping Use: Never used  Substance Use Topics   Alcohol use: No   Drug use: No    ROS: See HPI  PE: BP (!) 109/50   Pulse 83   Ht '5\' 5"'$  (1.651 m)   Wt 120 lb (54.4 kg)   BMI 19.97 kg/m  GENERAL APPEARANCE:  Well appearing, well developed, well nourished, NAD HEENT:  Atraumatic, normocephalic NECK:  Supple. Trachea midline ABDOMEN:  Soft, non-tender, no masses EXTREMITIES:  Moves all extremities well, NEUROLOGIC:  Alert and oriented x 3, normal gait BACK:  Non-tender to palpation, No CVAT SKIN:  Warm, dry, and intact   Results: Laboratory Data: Lab Results  Component Value Date   WBC 7.3 02/01/2022   HGB 11.2 (L) 02/01/2022   HCT 33.6 (L) 02/01/2022   MCV 97.1 02/01/2022   PLT 214 02/01/2022    Lab Results  Component Value Date   CREATININE 0.89 02/01/2022    No results  found for: "PSA"  No results found for: "TESTOSTERONE"  No results found for: "HGBA1C"  Urinalysis    Component Value Date/Time   COLORURINE DARK YELLOW 10/13/2021 1206   APPEARANCEUR Clear 02/13/2022 1014   LABSPEC 1.020 10/13/2021 1206   PHURINE 6.0 10/13/2021 1206   GLUCOSEU Negative 02/13/2022 [place] 10/13/2021 1206   BILIRUBINUR Negative 02/13/2022 1014   KETONESUR NEGATIVE 10/13/2021 1206   PROTEINUR 2+ (A) 02/13/2022 1014   PROTEINUR TRACE (A) 10/13/2021 1206   UROBILINOGEN 0.2 05/27/2014 1428   NITRITE Negative 02/13/2022 1014   NITRITE NEGATIVE 10/13/2021 1206   LEUKOCYTESUR 1+ (A) 02/13/2022 1014   LEUKOCYTESUR NEGATIVE 10/13/2021 1206    Lab Results  Component Value Date   LABMICR Comment 02/13/2022   WBCUA 0-5 10/26/2021   RBCUA 3-10 (A) 08/03/2015   LABEPIT 0-10 10/26/2021   MUCUS Present 10/21/2021   BACTERIA Few (A) 10/26/2021    Pertinent Imaging: No results found for this or any previous visit.  No results found for this or any previous visit.  No results  found for this or any previous visit.  No results found for this or any previous visit.  No results found for this or any previous visit.  No results found for this or any previous visit.  No results found for this or any previous visit.  No results found for this or any previous visit.  No results found for this or any previous visit (from the past 24 hour(s)).

## 2022-03-05 ENCOUNTER — Other Ambulatory Visit: Payer: Self-pay | Admitting: Family Medicine

## 2022-03-05 MED ORDER — OXYCODONE-ACETAMINOPHEN 7.5-325 MG PO TABS: 1.0000 | ORAL_TABLET | ORAL | 0 refills | Status: DC | PRN

## 2022-03-08 ENCOUNTER — Ambulatory Visit: Payer: Medicare Other | Admitting: Physician Assistant

## 2022-03-17 ENCOUNTER — Encounter (HOSPITAL_COMMUNITY): Payer: Self-pay

## 2022-03-17 ENCOUNTER — Other Ambulatory Visit (HOSPITAL_COMMUNITY): Payer: Self-pay | Admitting: Psychiatry

## 2022-03-21 ENCOUNTER — Other Ambulatory Visit: Payer: Self-pay | Admitting: Family Medicine

## 2022-03-21 ENCOUNTER — Telehealth: Payer: Self-pay

## 2022-03-21 ENCOUNTER — Encounter: Payer: Self-pay | Admitting: Family Medicine

## 2022-03-21 MED ORDER — OXYCODONE-ACETAMINOPHEN 7.5-325 MG PO TABS: 1.0000 | ORAL_TABLET | ORAL | 0 refills | Status: DC | PRN

## 2022-04-04 ENCOUNTER — Encounter: Payer: Self-pay | Admitting: Family Medicine

## 2022-04-05 NOTE — Telephone Encounter (Signed)
LOV 10/13/21 Last refill 03/11/22, #30, 0 refills  Please review, thanks!

## 2022-04-06 MED ORDER — OXYCODONE-ACETAMINOPHEN 7.5-325 MG PO TABS: 1.0000 | ORAL_TABLET | ORAL | 0 refills | Status: DC | PRN

## 2022-04-13 ENCOUNTER — Telehealth: Payer: Medicare Other

## 2022-04-17 ENCOUNTER — Telehealth (HOSPITAL_COMMUNITY): Payer: Medicare Other | Admitting: Psychiatry

## 2022-04-18 ENCOUNTER — Other Ambulatory Visit: Payer: Self-pay | Admitting: Family Medicine

## 2022-04-18 MED ORDER — OXYCODONE-ACETAMINOPHEN 7.5-325 MG PO TABS: 1.0000 | ORAL_TABLET | ORAL | 0 refills | Status: DC | PRN

## 2022-04-24 DIAGNOSIS — H43813 Vitreous degeneration, bilateral: Secondary | ICD-10-CM | POA: Diagnosis not present

## 2022-04-24 DIAGNOSIS — H2513 Age-related nuclear cataract, bilateral: Secondary | ICD-10-CM | POA: Diagnosis not present

## 2022-05-01 ENCOUNTER — Other Ambulatory Visit: Payer: Self-pay | Admitting: Family Medicine

## 2022-05-01 ENCOUNTER — Encounter: Payer: Self-pay | Admitting: Family Medicine

## 2022-05-01 MED ORDER — OXYCODONE-ACETAMINOPHEN 7.5-325 MG PO TABS: 1.0000 | ORAL_TABLET | ORAL | 0 refills | Status: DC | PRN

## 2022-05-11 ENCOUNTER — Other Ambulatory Visit: Payer: Self-pay | Admitting: Family Medicine

## 2022-05-11 MED ORDER — OXYCODONE-ACETAMINOPHEN 7.5-325 MG PO TABS: 1.0000 | ORAL_TABLET | ORAL | 0 refills | Status: DC | PRN

## 2022-05-23 ENCOUNTER — Encounter: Payer: Self-pay | Admitting: Family Medicine

## 2022-05-23 MED ORDER — OXYCODONE-ACETAMINOPHEN 7.5-325 MG PO TABS: 1.0000 | ORAL_TABLET | ORAL | 0 refills | Status: DC | PRN

## 2022-05-31 ENCOUNTER — Encounter: Payer: Self-pay | Admitting: Family Medicine

## 2022-06-01 MED ORDER — OXYCODONE-ACETAMINOPHEN 7.5-325 MG PO TABS: 1.0000 | ORAL_TABLET | ORAL | 0 refills | Status: DC | PRN

## 2022-06-05 ENCOUNTER — Ambulatory Visit: Payer: Medicare Other | Admitting: Urology

## 2022-06-05 DIAGNOSIS — Z8603 Personal history of neoplasm of uncertain behavior: Secondary | ICD-10-CM

## 2022-06-14 ENCOUNTER — Encounter: Payer: Self-pay | Admitting: Family Medicine

## 2022-06-15 ENCOUNTER — Telehealth: Payer: Self-pay

## 2022-06-15 NOTE — Telephone Encounter (Signed)
Pt would like a refill on her Oxycodone. Last RF was 06/01/2022. Last OV was 10/13/2021. Pt uses [facility]. Thank you.

## 2022-06-16 ENCOUNTER — Other Ambulatory Visit: Payer: Self-pay | Admitting: Family Medicine

## 2022-06-16 MED ORDER — OXYCODONE-ACETAMINOPHEN 7.5-325 MG PO TABS: 1.0000 | ORAL_TABLET | ORAL | 0 refills | Status: DC | PRN

## 2022-06-18 ENCOUNTER — Other Ambulatory Visit (HOSPITAL_COMMUNITY): Payer: Self-pay | Admitting: Psychiatry

## 2022-06-19 NOTE — Telephone Encounter (Signed)
Call for appt

## 2022-06-21 ENCOUNTER — Encounter: Payer: Self-pay | Admitting: Cardiology

## 2022-06-21 ENCOUNTER — Other Ambulatory Visit
Admission: RE | Admit: 2022-06-21 | Discharge: 2022-06-21 | Disposition: A | Discharge status: Home / Self Care | Payer: Medicare Other | Source: Ambulatory Visit | Attending: Cardiology | Admitting: Cardiology

## 2022-06-21 ENCOUNTER — Telehealth: Payer: Self-pay | Admitting: Cardiology

## 2022-06-21 ENCOUNTER — Ambulatory Visit: Payer: Medicare Other | Admitting: Cardiology

## 2022-06-21 VITALS — BP 130/78 | HR 73 | Ht 65.0 in | Wt 118.0 lb

## 2022-06-21 DIAGNOSIS — Z79899 Other long term (current) drug therapy: Secondary | ICD-10-CM | POA: Insufficient documentation

## 2022-06-21 DIAGNOSIS — I480 Paroxysmal atrial fibrillation: Secondary | ICD-10-CM | POA: Diagnosis not present

## 2022-06-21 DIAGNOSIS — I6523 Occlusion and stenosis of bilateral carotid arteries: Secondary | ICD-10-CM | POA: Diagnosis not present

## 2022-06-21 LAB — TSH: TSH: 1.47 u[IU]/mL (ref 0.350–4.500)

## 2022-06-21 MED ORDER — AMIODARONE HCL 200 MG PO TABS: 200.0000 mg | ORAL_TABLET | Freq: Every day | ORAL | 3 refills | Status: DC

## 2022-06-21 NOTE — Telephone Encounter (Signed)
Normal results given to patient.

## 2022-06-21 NOTE — Progress Notes (Signed)
Cardiology Office Note  Date: 06/21/2022   ID: Abigail Wagner, Abigail Wagner September 26, 1950, MRN [id number]  PCP:  Susy Frizzle, MD  Cardiologist:  Rozann Lesches, MD Electrophysiologist:  None   Chief Complaint  Patient presents with   Cardiac follow-up    History of Present Illness: Abigail Wagner is a 71 y.o. female last seen in March.  She is here today with her husband for a follow-up visit.  She does not report any interval sense of palpitations, no chest discomfort.  States that she has been compliant with her medications.  She does not report any spontaneous bleeding problems on Eliquis.  I did review her interval lab work.  She also remains on low-dose amiodarone for suppression of atrial fibrillation.  I personally reviewed her ECG today which shows normal sinus rhythm with nonspecific ST changes, normal QTc.  She does need a TSH checked this year.  Past Medical History:  Diagnosis Date   Allergy    Anal carcinoma ([place])    Chemo and radiation   Anemia    Anxiety    Dr. Harrington Challenger at [place] (psychiatrist).     Blood transfusion without reported diagnosis 2017   Carotid artery disease (HCC)    Chronic kidney disease    DDD (degenerative disc disease), lumbosacral    GERD (gastroesophageal reflux disease)    Headache(784.0)    History of adenomatous polyp of colon 10/07/2015   Tubular adenoma high grade dysplasia   History of herpes genitalis    History of kidney stones    History of suicide attempt 2010   HTN (hypertension)    Hyperlipidemia    IBS (irritable bowel syndrome)    Major depression    OA (osteoarthritis)    Ovarian cancer ([place])    Stage IIIB papillary ovarian carcinoma  s/p omentectomy and BSO & chemotherapy (completed 12-07-2014)   Paroxysmal atrial fibrillation (HCC)    Prolapse of vaginal vault after hysterectomy 07/20/2015   Rectovaginal fistula 08/05/2015   Seasonal allergies    Sigmoid diverticulosis    Smokers' cough [facility])     Past Surgical  History:  Procedure Laterality Date   BIOPSY  10/12/2020   Procedure: BIOPSY;  Surgeon: Harvel Quale, MD;  [facility];  Service: Gastroenterology;;   [place]   COLONOSCOPY N/A 10/07/2015   Procedure: COLONOSCOPY;  Surgeon: Rogene Houston, MD;  [facility];  Service: Endoscopy;  Laterality: N/A;  7:30   COLONOSCOPY WITH PROPOFOL N/A 10/12/2020   Procedure: COLONOSCOPY WITH PROPOFOL;  Surgeon: Harvel Quale, MD;  [facility];  Service: Gastroenterology;  Laterality: N/A;  9:00   [place] N/A 02/27/2022   Procedure: CYSTOSCOPY;  Surgeon: Cleon Gustin, MD;  [facility];  Service: Urology;  Laterality: N/A;   CYSTOSCOPY WITH FULGERATION N/A 11/03/2021   Procedure: CYSTOSCOPY WITH FULGERATION;  Surgeon: Cleon Gustin, MD;  [facility];  Service: Urology;  Laterality: N/A;   ESOPHAGOGASTRODUODENOSCOPY (EGD) WITH PROPOFOL N/A 10/12/2020   Procedure: ESOPHAGOGASTRODUODENOSCOPY (EGD) WITH PROPOFOL;  Surgeon: Harvel Quale, MD;  [facility];  Service: Gastroenterology;  Laterality: N/A;   EXPLORATORY LAPAROTOMY/ OMENTECTOMY/  BILATERAL SALPINGOOPHORECTOMY/  PORT-A-CATH PLACEMENT  07-03-2014   [place]   KNEE ARTHROSCOPY Left 09/22/2004   LAPAROSCOPY N/A 06/02/2014   Procedure: DIAGNOSTIC LAPAROSCOPY, OMENTAL BIOPSY, RIGHT OVARY  BIOPSY, LYSIS OF ADHESIONS;  Surgeon: Fanny Skates, MD;  Location: [place];  Service: General;  Laterality: N/A;   MUCOSAL ADVANCEMENT FLAP N/A 06/15/2016   Procedure: EXCISION RECTOVAGINAOL FISTULA WITH MUCOSAL ADVANCEMENT FLAP;  Surgeon: Leighton Ruff, MD;  Location: [place];  Service: General;  Laterality: N/A;   [place] N/A 09/09/2015   Procedure: PLACEMENT OF SETON;  Surgeon: Leighton Ruff, MD;  Location: [place];   Service: General;  Laterality: N/A;   PORT-A-CATH REMOVAL Right 08/17/2014   Procedure: REMOVAL INTRAPERITONEAL CHEMO PORT;  Surgeon: Fanny Skates, MD;  Location: [place];  Service: General;  Laterality: Right;   PORTACATH PLACEMENT Right 08/17/2014   Procedure:  PLACE NEW PORT A CATH;  Surgeon: Fanny Skates, MD;  Location: [place];  Service: General;  Laterality: Right;   RECTAL BIOPSY N/A 09/09/2015   Procedure: BIOPSY OF RECTOVAGINAL MASS;  Surgeon: Leighton Ruff, MD;  Location: [place];  Service: General;  Laterality: N/A;   TRANSURETHRAL RESECTION OF BLADDER TUMOR N/A 02/27/2022   Procedure: TRANSURETHRAL RESECTION OF BLADDER TUMOR (TURBT);  Surgeon: Cleon Gustin, MD;  [facility];  Service: Urology;  Laterality: N/A;   TUBAL LIGATION  YRS AGO   VAGINAL HYSTERECTOMY  1981   fibroids    Current Outpatient Medications  Medication Sig Dispense Refill   apixaban (ELIQUIS) 5 MG TABS tablet Take 1 tablet (5 mg total) by mouth 2 (two) times daily. 60 tablet 5   Apoaequorin (PREVAGEN EXTRA STRENGTH) 20 MG CAPS Take by mouth daily.     Aspirin-Salicylamide-Caffeine (BC HEADACHE POWDER PO) Take 1-2 Packages by mouth daily at 12 noon.     b complex vitamins tablet Take 1 tablet by mouth daily.     Cholecalciferol (VITAMIN D-3) 125 MCG (5000 UT) TABS Take 5,000 Units by mouth daily.     clotrimazole (LOTRIMIN) 1 % cream Apply 1 application topically 2 (two) times daily as needed (itching around ankles).     Coenzyme Q10 (CO Q 10) 100 MG CAPS Take 100 mg by mouth daily.     divalproex (DEPAKOTE ER) 500 MG 24 hr tablet Take 1 tablet (500 mg total) by mouth daily. 90 tablet 3   escitalopram (LEXAPRO) 20 MG tablet Take 1 tablet (20 mg total) by mouth daily. 30 tablet 3   estradiol (ESTRACE) 0.1 MG/GM vaginal cream Apply a pea size amount of cream to urethral area of vagina twice weekly 42.5 g 12   Ferrous Gluconate (IRON 27 PO) Take  27 mg by mouth daily.     fluorometholone (FML) 0.1 % ophthalmic suspension Place 1 drop into both eyes daily as needed (Red eyes).     Homeopathic Products (THERAWORX RELIEF EX) Apply 1 application. topically daily as needed (joint pain).     Krill Oil 350 MG CAPS Take 350 mg by mouth daily.     LORazepam (ATIVAN) 0.5 MG tablet TAKE 1 TABLET (0.5 MG TOTAL) BY MOUTH 3 (THREE) TIMES DAILY AS NEEDED FOR ANXIETY. 90 tablet 0   Multiple Vitamin (MULTIVITAMIN) tablet Take 1 tablet by mouth daily.     Multiple Vitamins-Minerals (HAIR SKIN & NAILS ADVANCED PO) Take 3 tablets by mouth daily.     omeprazole (PRILOSEC) 40 MG capsule TAKE 1 CAPSULE (40 MG TOTAL) BY MOUTH DAILY. 90 capsule 3   ondansetron (ZOFRAN) 4 MG tablet Take 1 tablet (4 mg total) by mouth daily as needed for nausea or vomiting.  30 tablet 1   oxyCODONE-acetaminophen (PERCOCET) 7.5-325 MG tablet Take 1 tablet by mouth every 4 (four) hours as needed for severe pain. 30 tablet 0   polyethylene glycol (MIRALAX / GLYCOLAX) 17 g packet Take 17 g by mouth daily. 30 each 1   rosuvastatin (CRESTOR) 20 MG tablet Take 1 tablet (20 mg total) by mouth daily. 90 tablet 3   senna-docusate (SENOKOT S) 8.6-50 MG tablet Take 1 tablet by mouth daily. 20 tablet 0   amiodarone (PACERONE) 200 MG tablet Take 1 tablet (200 mg total) by mouth daily. 90 tablet 3   No current facility-administered medications for this visit.   Allergies:  Morphine and related   ROS: No orthopnea or PND.  Physical Exam: VS:  BP 130/78   Pulse 73   Ht '5\' 5"'$  (1.651 m)   Wt 118 lb (53.5 kg)   SpO2 96%   BMI 19.64 kg/m , BMI Body mass index is 19.64 kg/m.  Wt Readings from Last 3 Encounters:  06/21/22 118 lb (53.5 kg)  03/03/22 120 lb (54.4 kg)  02/27/22 120 lb 9.5 oz (54.7 kg)    General: Patient appears comfortable at rest. HEENT: Conjunctiva and lids normal. Neck: Supple, no elevated JVP or carotid bruits, no thyromegaly. Lungs: Clear to auscultation, nonlabored  breathing at rest. Cardiac: Regular rate and rhythm, no S3 or significant systolic murmur. Extremities: No pitting edema.  ECG:  An ECG dated 06/30/2021 was personally reviewed today and demonstrated:  Sinus rhythm with PACs.  Recent Labwork: 06/29/2021: Magnesium 2.0 02/01/2022: ALT 14; AST 24; BUN 17; Creatinine, Ser 0.89; Hemoglobin 11.2; Platelets 214; Potassium 3.9; Sodium 136     Component Value Date/Time   CHOL 140 06/29/2021 0519   TRIG 76 06/29/2021 0519   HDL 51 06/29/2021 0519   CHOLHDL 2.7 06/29/2021 0519   VLDL 15 06/29/2021 0519   LDLCALC 74 06/29/2021 0519   LDLCALC 80 01/10/2021 1017    Other Studies Reviewed Today:  Echocardiogram 06/14/2021:  1. Left ventricular ejection fraction, by estimation, is 60 to 65%. The  left ventricle has normal function. The left ventricle has no regional  wall motion abnormalities. Left ventricular diastolic parameters are  consistent with Grade II diastolic  dysfunction (pseudonormalization).   2. Right ventricular systolic function is normal. The right ventricular  size is mildly enlarged. There is normal pulmonary artery systolic  pressure. The estimated right ventricular systolic pressure is 73.4 mmHg.   3. The mitral valve is normal in structure. Trivial mitral valve  regurgitation. No evidence of mitral stenosis.   4. Tricuspid valve regurgitation is moderate.   5. The aortic valve is tricuspid. Aortic valve regurgitation is trivial.  Mild to moderate aortic valve sclerosis/calcification is present, without  any evidence of aortic stenosis.   6. The inferior vena cava is normal in size with greater than 50%  respiratory variability, suggesting right atrial pressure of 3 mmHg.  Carotid Dopplers 06/29/2021: IMPRESSION: Color duplex indicates moderate heterogeneous and calcified plaque, with no hemodynamically significant stenosis by duplex criteria in the extracranial cerebrovascular circulation.  Assessment and Plan:  1.   Paroxysmal atrial fibrillation with CHA2DS2-VASc score of 4.  She remains on Eliquis for stroke prophylaxis and has done well in terms of rhythm suppression on amiodarone.  ECG reviewed.  LFTs normal in May.  We will check TSH.  Otherwise no change in current regimen.  2.  Asymptomatic carotid artery disease.  She is on Crestor with last LDL 74.  Medication  Adjustments/Labs and Tests Ordered: Current medicines are reviewed at length with the patient today.  Concerns regarding medicines are outlined above.   Tests Ordered: Orders Placed This Encounter  Procedures   TSH   EKG 12-Lead    Medication Changes: Meds ordered this encounter  Medications   amiodarone (PACERONE) 200 MG tablet    Sig: Take 1 tablet (200 mg total) by mouth daily.    Dispense:  90 tablet    Refill:  3    Disposition:  Follow up  6 months.  Signed, Satira Sark, MD, [facility] 06/21/2022 1:52 PM    [facility] [address], [place], [place] [address] Phone: [phone]; Fax: [phone]

## 2022-06-21 NOTE — Patient Instructions (Addendum)
Medication Instructions:  Your physician recommends that you continue on your current medications as directed. Please refer to the Current Medication list given to you today.   Labwork: TSH  Testing/Procedures: None today  Follow-Up: 6 months  Any Other Special Instructions Will Be Listed Below (If Applicable).  If you need a refill on your cardiac medications before your next appointment, please call your pharmacy.

## 2022-06-21 NOTE — Telephone Encounter (Signed)
Patient returning call for lab results. 

## 2022-06-25 ENCOUNTER — Encounter: Payer: Self-pay | Admitting: Family Medicine

## 2022-06-27 ENCOUNTER — Telehealth: Payer: Self-pay

## 2022-06-27 NOTE — Telephone Encounter (Signed)
Pt is out of Oxycodone. Pt's husband, Coralyn Mark, asks if there is any way the patient can get a 30 day supply? Coralyn Mark states the pt takes the medication up to 6 times a day and the last rx was only for a 5 day supply. Thank you.

## 2022-06-30 ENCOUNTER — Encounter: Payer: Self-pay | Admitting: Family Medicine

## 2022-06-30 ENCOUNTER — Ambulatory Visit: Payer: Medicare Other | Admitting: Family Medicine

## 2022-06-30 ENCOUNTER — Other Ambulatory Visit: Payer: Self-pay | Admitting: Family Medicine

## 2022-06-30 MED ORDER — OXYCODONE-ACETAMINOPHEN 7.5-325 MG PO TABS: 1.0000 | ORAL_TABLET | ORAL | 0 refills | Status: DC | PRN

## 2022-07-04 ENCOUNTER — Ambulatory Visit (HOSPITAL_COMMUNITY)
Admission: RE | Admit: 2022-07-04 | Discharge: 2022-07-04 | Disposition: A | Discharge status: Home / Self Care | Payer: Medicare Other | Source: Ambulatory Visit | Attending: Family Medicine | Admitting: Family Medicine

## 2022-07-04 ENCOUNTER — Ambulatory Visit (INDEPENDENT_AMBULATORY_CARE_PROVIDER_SITE_OTHER): Payer: Medicare Other | Admitting: Family Medicine

## 2022-07-04 VITALS — BP 136/78 | HR 75 | Ht 65.0 in | Wt 115.6 lb

## 2022-07-04 DIAGNOSIS — M25552 Pain in left hip: Secondary | ICD-10-CM

## 2022-07-04 MED ORDER — PREDNISONE 20 MG PO TABS: ORAL_TABLET | ORAL | 0 refills | Status: DC

## 2022-07-04 NOTE — Progress Notes (Signed)
Subjective:    Patient ID: Abigail Wagner, female    DOB: [birth year], 71 y.o.   MRN: [id number]  Hip Pain   About 3 weeks ago, the patient was lying in bed.  She rolled over to turn off her light and accidentally rolled out of the bed landing on right hip.  Initially she was having pain in her right hip however over the last few weeks she has developed pain in her left hip.  The pain is located anterior.  Next to her perineum.  It is in the hip joint itself.  She does have some tenderness to palpation over the greater trochanter.  However the majority the pain is located anterior directly over the hip joint.  She has no pain with flexion.  She has some pain with external rotation and some pain with internal rotation.  She is able to walk but she has some sharp stabbing pain in the hip joint when she bears weight on it.  There is no bruising or swelling.  She denies any sciatica.  She denies any posterior hip pain Past Medical History:  Diagnosis Date   Allergy    Anal carcinoma ([place])    Chemo and radiation   Anemia    Anxiety    Dr. Harrington Challenger at [place] (psychiatrist).     Blood transfusion without reported diagnosis 2017   Carotid artery disease (HCC)    Chronic kidney disease    DDD (degenerative disc disease), lumbosacral    GERD (gastroesophageal reflux disease)    Headache(784.0)    History of adenomatous polyp of colon 10/07/2015   Tubular adenoma high grade dysplasia   History of herpes genitalis    History of kidney stones    History of suicide attempt 2010   HTN (hypertension)    Hyperlipidemia    IBS (irritable bowel syndrome)    Major depression    OA (osteoarthritis)    Ovarian cancer ([place])    Stage IIIB papillary ovarian carcinoma  s/p omentectomy and BSO & chemotherapy (completed 12-07-2014)   Paroxysmal atrial fibrillation (HCC)    Prolapse of vaginal vault after hysterectomy 07/20/2015   Rectovaginal fistula 08/05/2015   Seasonal allergies    Sigmoid diverticulosis     Smokers' cough [facility])     Past Surgical History:  Procedure Laterality Date   BIOPSY  10/12/2020   Procedure: BIOPSY;  Surgeon: Harvel Quale, MD;  [facility];  Service: Gastroenterology;;   [place]   COLONOSCOPY N/A 10/07/2015   Procedure: COLONOSCOPY;  Surgeon: Rogene Houston, MD;  [facility];  Service: Endoscopy;  Laterality: N/A;  7:30   COLONOSCOPY WITH PROPOFOL N/A 10/12/2020   Procedure: COLONOSCOPY WITH PROPOFOL;  Surgeon: Harvel Quale, MD;  [facility];  Service: Gastroenterology;  Laterality: N/A;  9:00   [place] N/A 02/27/2022   Procedure: CYSTOSCOPY;  Surgeon: Cleon Gustin, MD;  Location: [facility];  Service: Urology;  Laterality: N/A;   CYSTOSCOPY WITH FULGERATION N/A 11/03/2021   Procedure: CYSTOSCOPY WITH FULGERATION;  Surgeon: Cleon Gustin, MD;  Location: [facility];  Service: Urology;  Laterality: N/A;   ESOPHAGOGASTRODUODENOSCOPY (EGD) WITH PROPOFOL N/A 10/12/2020   Procedure: ESOPHAGOGASTRODUODENOSCOPY (EGD) WITH PROPOFOL;  Surgeon: Harvel Quale, MD;  [facility];  Service: Gastroenterology;  Laterality: N/A;   EXPLORATORY LAPAROTOMY/ OMENTECTOMY/  BILATERAL SALPINGOOPHORECTOMY/  PORT-A-CATH PLACEMENT  07-03-2014   [place]   KNEE ARTHROSCOPY Left 09/22/2004   LAPAROSCOPY N/A 06/02/2014   Procedure: DIAGNOSTIC LAPAROSCOPY, OMENTAL BIOPSY, RIGHT OVARY BIOPSY, LYSIS OF ADHESIONS;  Surgeon: Fanny Skates, MD;  Location: [place];  Service: General;  Laterality: N/A;   MUCOSAL ADVANCEMENT FLAP N/A 06/15/2016   Procedure: EXCISION RECTOVAGINAOL FISTULA WITH MUCOSAL ADVANCEMENT FLAP;  Surgeon: Leighton Ruff, MD;  Location: [place];  Service: General;  Laterality: N/A;   [place] N/A 09/09/2015   Procedure: PLACEMENT OF SETON;  Surgeon: Leighton Ruff,  MD;  Location: [facility];  Service: General;  Laterality: N/A;   PORT-A-CATH REMOVAL Right 08/17/2014   Procedure: REMOVAL INTRAPERITONEAL CHEMO PORT;  Surgeon: Fanny Skates, MD;  Location: [place];  Service: General;  Laterality: Right;   PORTACATH PLACEMENT Right 08/17/2014   Procedure:  PLACE NEW PORT A CATH;  Surgeon: Fanny Skates, MD;  Location: [place];  Service: General;  Laterality: Right;   RECTAL BIOPSY N/A 09/09/2015   Procedure: BIOPSY OF RECTOVAGINAL MASS;  Surgeon: Leighton Ruff, MD;  Location: [place];  Service: General;  Laterality: N/A;   TRANSURETHRAL RESECTION OF BLADDER TUMOR N/A 02/27/2022   Procedure: TRANSURETHRAL RESECTION OF BLADDER TUMOR (TURBT);  Surgeon: Cleon Gustin, MD;  [facility];  Service: Urology;  Laterality: N/A;   TUBAL LIGATION  YRS AGO   VAGINAL HYSTERECTOMY  1981   fibroids   Current Outpatient Medications on File Prior to Visit  Medication Sig Dispense Refill   amiodarone (PACERONE) 200 MG tablet Take 1 tablet (200 mg total) by mouth daily. 90 tablet 3   apixaban (ELIQUIS) 5 MG TABS tablet Take 1 tablet (5 mg total) by mouth 2 (two) times daily. 60 tablet 5   Apoaequorin (PREVAGEN EXTRA STRENGTH) 20 MG CAPS Take by mouth daily.     Aspirin-Salicylamide-Caffeine (BC HEADACHE POWDER PO) Take 1-2 Packages by mouth daily at 12 noon.     b complex vitamins tablet Take 1 tablet by mouth daily.     Cholecalciferol (VITAMIN D-3) 125 MCG (5000 UT) TABS Take 5,000 Units by mouth daily.     clotrimazole (LOTRIMIN) 1 % cream Apply 1 application topically 2 (two) times daily as needed (itching around ankles).     Coenzyme Q10 (CO Q 10) 100 MG CAPS Take 100 mg by mouth daily.     divalproex (DEPAKOTE ER) 500 MG 24 hr tablet Take 1 tablet (500 mg total) by mouth daily. 90 tablet 3   escitalopram (LEXAPRO) 20 MG tablet Take 1 tablet (20 mg total) by mouth daily. 30 tablet 3    estradiol (ESTRACE) 0.1 MG/GM vaginal cream Apply a pea size amount of cream to urethral area of vagina twice weekly 42.5 g 12   Ferrous Gluconate (IRON 27 PO) Take 27 mg by mouth daily.     Homeopathic Products (THERAWORX RELIEF EX) Apply 1 application. topically daily as needed (joint pain).     Krill Oil 350 MG CAPS Take 350 mg by mouth daily.     LORazepam (ATIVAN) 0.5 MG tablet TAKE 1 TABLET (0.5 MG TOTAL) BY MOUTH 3 (THREE) TIMES DAILY AS NEEDED FOR ANXIETY. 90 tablet 0   Multiple Vitamin (MULTIVITAMIN) tablet Take 1 tablet by mouth daily.     Multiple Vitamins-Minerals (HAIR SKIN & NAILS ADVANCED PO) Take 3 tablets by mouth daily.     omeprazole (PRILOSEC) 40 MG capsule TAKE  1 CAPSULE (40 MG TOTAL) BY MOUTH DAILY. 90 capsule 3   ondansetron (ZOFRAN) 4 MG tablet Take 1 tablet (4 mg total) by mouth daily as needed for nausea or vomiting. 30 tablet 1   oxyCODONE-acetaminophen (PERCOCET) 7.5-325 MG tablet Take 1 tablet by mouth every 4 (four) hours as needed for severe pain. 30 tablet 0   polyethylene glycol (MIRALAX / GLYCOLAX) 17 g packet Take 17 g by mouth daily. 30 each 1   rosuvastatin (CRESTOR) 20 MG tablet Take 1 tablet (20 mg total) by mouth daily. 90 tablet 3   fluorometholone (FML) 0.1 % ophthalmic suspension Place 1 drop into both eyes daily as needed (Red eyes). (Patient not taking: Reported on 07/04/2022)     senna-docusate (SENOKOT S) 8.6-50 MG tablet Take 1 tablet by mouth daily. (Patient not taking: Reported on 07/04/2022) 20 tablet 0   No current facility-administered medications on file prior to visit.   Allergies  Allergen Reactions   Morphine And Related     Delirium with sbo   Social History   Socioeconomic History   Marital status: Married    Spouse name: Coralyn Mark   Number of children: 4   Years of education: 12   Highest education level: Not on file  Occupational History   Occupation: retire    Comment: [employer]  Tobacco Use   Smoking status: Former     Packs/day: 0.50    Years: 15.00    Total pack years: 7.50    Types: Cigarettes    Quit date: 02/16/2018    Years since quitting: 4.3   Smokeless tobacco: Never   Tobacco comments:    half pack a day for 15 yrs  Vaping Use   Vaping Use: Never used  Substance and Sexual Activity   Alcohol use: No   Drug use: No   Sexual activity: Not Currently    Birth control/protection: Surgical    Comment: hyst  Other Topics Concern   Not on file  Social History Narrative   Lives at home with Coralyn Mark   Retired [employer]   Married 02/16/1990   Several grandchildren    Social Determinants of Health   Financial Resource Strain: [place]  (01/27/2022)   Overall Financial Resource Strain (CARDIA)    Difficulty of Paying Living Expenses: Not hard at all  Food Insecurity: No Food Insecurity (01/27/2022)   Hunger Vital Sign    Worried About Running Out of Food in the Last Year: Never true    [place] in the Last Year: Never true  Transportation Needs: No Transportation Needs (01/27/2022)   PRAPARE - Hydrologist (Medical): No    Lack of Transportation (Non-Medical): No  Physical Activity: Insufficiently Active (01/27/2022)   Exercise Vital Sign    Days of Exercise per Week: 3 days    Minutes of Exercise per Session: 20 min  Stress: No Stress Concern Present (01/27/2022)   [place]    Feeling of Stress : Not at all  Social Connections: [place] (01/27/2022)   Social Connection and Isolation Panel [NHANES]    Frequency of Communication with Friends and Family: More than three times a week    Frequency of Social Gatherings with Friends and Family: More than three times a week    Attends Religious Services: More than 4 times per year    Active Member of [employer] or Organizations: Yes  Attends Archivist Meetings: More than 4 times per year    Marital Status: Married  Human resources officer  Violence: Not At Risk (01/27/2022)   Humiliation, Afraid, Rape, and Kick questionnaire    Fear of Current or Ex-Partner: No    Emotionally Abused: No    Physically Abused: No    Sexually Abused: No   Family History  Problem Relation Age of Onset   Bipolar disorder Mother    Anxiety disorder Mother    Dementia Mother    Depression Mother    Mental illness Mother    Vision loss Mother    Alzheimer's disease Mother    Alcohol abuse Paternal Uncle    Bipolar disorder Maternal Grandmother    Dementia Maternal Grandmother    Alzheimer's disease Maternal Grandmother    Alcohol abuse Paternal Uncle    Stroke Brother    Deep vein thrombosis Son    Heart disease Father    Hyperlipidemia Father    Hypertension Father    Stroke Father    Vision loss Father    Atrial fibrillation Sister    Heart disease Brother    Heart disease Brother    ADD / ADHD Neg Hx    Drug abuse Neg Hx    OCD Neg Hx    Paranoid behavior Neg Hx    Schizophrenia Neg Hx    Seizures Neg Hx    Sexual abuse Neg Hx    Physical abuse Neg Hx       Review of Systems     Objective:   Physical Exam Vitals reviewed.  Constitutional:      General: She is not in acute distress.    Appearance: She is not ill-appearing or diaphoretic.  Cardiovascular:     Rate and Rhythm: Normal rate and regular rhythm.     Heart sounds: Normal heart sounds. No murmur heard.    No friction rub. No gallop.  Pulmonary:     Effort: Pulmonary effort is normal. No respiratory distress.     Breath sounds: Normal breath sounds. No stridor. No wheezing or rales.  Abdominal:     General: Abdomen is flat. Bowel sounds are normal. There is no distension.     Palpations: Abdomen is soft. There is no mass.     Tenderness: There is no abdominal tenderness.  Musculoskeletal:     Right hip: No tenderness or bony tenderness. Normal range of motion. Normal strength.     Left hip: Tenderness present. No bony tenderness or crepitus. Decreased  range of motion. Normal strength.     Right upper leg: No deformity.     Left upper leg: Normal. No deformity.     Right lower leg: No edema.     Left lower leg: No edema.       Legs:  Neurological:     Mental Status: She is alert.         Assessment & Plan:  Left hip pain - Plan: DG Hip Unilat W OR W/O Pelvis 2-3 Views Left I do not believe the patient suffered a fracture however I will obtain an x-ray to be complete.  I believe the patient likely irritated the cartilage within the hip joint when she fell and would benefit from an anti-inflammatory however she is not able to take anti-inflammatories due to the blood thinners.  Therefore we will start her on prednisone taper pack and await results of the x-ray.  If the x-ray is negative, and if  she continues to have pain, I would recommend orthopedic consultation for possible cortisone injection

## 2022-07-09 ENCOUNTER — Other Ambulatory Visit: Payer: Self-pay | Admitting: Family Medicine

## 2022-07-10 NOTE — Telephone Encounter (Signed)
Send msg via pt msg regarding refill is too early, oxyCODONE-acetaminophen (PERCOCET) 7.5-325 MG tablet

## 2022-07-11 ENCOUNTER — Telehealth: Payer: Self-pay | Admitting: Family Medicine

## 2022-07-11 ENCOUNTER — Telehealth: Payer: Self-pay

## 2022-07-11 NOTE — Telephone Encounter (Signed)
Pt called in to inquire about getting a refill of oxyCODONE-acetaminophen (PERCOCET) 7.5-325 MG tablet [[id number]]    Order Details Dose: 1 tablet Route: Oral Frequency: Every 4 hours PRN for severe pain    Pt has been informed that another refill cannot be sent until 11/6, but pt stated that she needs to have this refilled.  LOV: 10/13/21    Please advise.  Cb#: [phone]

## 2022-07-12 ENCOUNTER — Ambulatory Visit
Admission: EM | Admit: 2022-07-12 | Discharge: 2022-07-12 | Disposition: A | Discharge status: Home / Self Care | Payer: Medicare Other | Attending: Family Medicine | Admitting: Family Medicine

## 2022-07-12 ENCOUNTER — Other Ambulatory Visit: Payer: Self-pay | Admitting: Family Medicine

## 2022-07-12 DIAGNOSIS — M25552 Pain in left hip: Secondary | ICD-10-CM

## 2022-07-12 MED ORDER — TIZANIDINE HCL 2 MG PO CAPS: 2.0000 mg | ORAL_CAPSULE | Freq: Three times a day (TID) | ORAL | 0 refills | Status: DC | PRN

## 2022-07-12 NOTE — Telephone Encounter (Signed)
Pt's husband Coralyn Mark called and states patient is only receiving enough for the month. I am not sure what is happening. Coralyn Mark states the patient is taking the medication as directed.

## 2022-07-12 NOTE — ED Triage Notes (Signed)
Pt reports left hip pain x 2 weeks. Reports she fell 6 weeks ago and the pain was in the right hip and now is in the left hip, was taking prednisone without relief.

## 2022-07-12 NOTE — ED Provider Notes (Signed)
[facility]    CSN: [id number] Arrival date & time: 07/12/22  0808      History   Chief Complaint Chief Complaint  Patient presents with   Hip Pain    HPI Abigail Wagner is a 71 y.o. female.   Patient presenting today with a week or more of left posterior hip pain in the buttock region, muscle spasms with movements.  States she fell about a month ago onto her right hip and had a large bruise on this side and pain for several weeks but that has resolved, now the pain is on the left side.  Denies radiation of pain down legs, numbness weakness or tingling, decreased range of motion, inability to bear weight, swelling, discoloration to this area.  Taking BC powder and states she typically is on a Percocet regimen through PCP but having trouble getting her medication at the moment so not currently taking.  States this helps somewhat temporarily.    Past Medical History:  Diagnosis Date   Allergy    Anal carcinoma ([place])    Chemo and radiation   Anemia    Anxiety    Dr. Harrington Challenger at [place] (psychiatrist).     Blood transfusion without reported diagnosis 2017   Carotid artery disease (HCC)    Chronic kidney disease    DDD (degenerative disc disease), lumbosacral    GERD (gastroesophageal reflux disease)    Headache(784.0)    History of adenomatous polyp of colon 10/07/2015   Tubular adenoma high grade dysplasia   History of herpes genitalis    History of kidney stones    History of suicide attempt 2010   HTN (hypertension)    Hyperlipidemia    IBS (irritable bowel syndrome)    Major depression    OA (osteoarthritis)    Ovarian cancer ([place])    Stage IIIB papillary ovarian carcinoma  s/p omentectomy and BSO & chemotherapy (completed 12-07-2014)   Paroxysmal atrial fibrillation (HCC)    Prolapse of vaginal vault after hysterectomy 07/20/2015   Rectovaginal fistula 08/05/2015   Seasonal allergies    Sigmoid diverticulosis    Smokers' cough [facility])     Patient  Active Problem List   Diagnosis Date Noted   Atrial fibrillation with RVR ([place]) 06/28/2021   Recurrent Diverticulitis 06/28/2021   Poor appetite 06/28/2021   AKI (acute kidney injury) ([place]) 06/28/2021   Chronic diarrhea 10/06/2020   Chronic prescription opiate use 10/06/2020   SBO (small bowel obstruction) ([place]) 05/25/2020   Vulvar irritation 03/10/2020   Internal carotid artery stenosis, left    Neural foraminal stenosis of lumbar spine 05/16/2018   Genetic testing 05/24/2017   Vaginal infection 09/27/2016   Essential hypertension 09/27/2016   GERD (gastroesophageal reflux disease) 09/27/2016   Back pain 09/27/2016   Anxiety 09/27/2016   Allergy 09/27/2016   Radiation proctitis 12/23/2015   Abnormal weight loss 12/15/2015   Anal cancer ([place]) 11/19/2015   Dehydration 11/10/2015   History of ovarian cancer 08/03/2015   Prolapse of vaginal vault after hysterectomy 07/20/2015   Port-A-Cath in place 01/13/2015   Nausea with vomiting 10/23/2014   Epithelial ovarian cancer, FIGO stage IIIA ([place]) 07/16/2014   Abdominal carcinomatosis ([place]) 06/18/2014   Bipolar 1 disorder ([place]) 12/27/2011   Kidney stones 12/27/2011   Depression 11/07/2011    Past Surgical History:  Procedure Laterality Date   BIOPSY  10/12/2020   Procedure: BIOPSY;  Surgeon: Harvel Quale, MD;  [facility];  Service: Gastroenterology;;  BREAST ENHANCEMENT SURGERY  1986   BREAST SURGERY  1986   CESAREAN SECTION  1978   COLONOSCOPY N/A 10/07/2015   Procedure: COLONOSCOPY;  Surgeon: Rogene Houston, MD;  [facility];  Service: Endoscopy;  Laterality: N/A;  7:30   COLONOSCOPY WITH PROPOFOL N/A 10/12/2020   Procedure: COLONOSCOPY WITH PROPOFOL;  Surgeon: Harvel Quale, MD;  [facility];  Service: Gastroenterology;  Laterality: N/A;  9:00   [place] N/A 02/27/2022   Procedure: CYSTOSCOPY;  Surgeon: Cleon Gustin, MD;   Location: [facility];  Service: Urology;  Laterality: N/A;   CYSTOSCOPY WITH FULGERATION N/A 11/03/2021   Procedure: CYSTOSCOPY WITH FULGERATION;  Surgeon: Cleon Gustin, MD;  Location: [facility];  Service: Urology;  Laterality: N/A;   ESOPHAGOGASTRODUODENOSCOPY (EGD) WITH PROPOFOL N/A 10/12/2020   Procedure: ESOPHAGOGASTRODUODENOSCOPY (EGD) WITH PROPOFOL;  Surgeon: Harvel Quale, MD;  [facility];  Service: Gastroenterology;  Laterality: N/A;   EXPLORATORY LAPAROTOMY/ OMENTECTOMY/  BILATERAL SALPINGOOPHORECTOMY/  PORT-A-CATH PLACEMENT  07-03-2014   [place]   KNEE ARTHROSCOPY Left 09/22/2004   LAPAROSCOPY N/A 06/02/2014   Procedure: DIAGNOSTIC LAPAROSCOPY, OMENTAL BIOPSY, RIGHT OVARY BIOPSY, LYSIS OF ADHESIONS;  Surgeon: Fanny Skates, MD;  Location: [place];  Service: General;  Laterality: N/A;   MUCOSAL ADVANCEMENT FLAP N/A 06/15/2016   Procedure: EXCISION RECTOVAGINAOL FISTULA WITH MUCOSAL ADVANCEMENT FLAP;  Surgeon: Leighton Ruff, MD;  Location: [place];  Service: General;  Laterality: N/A;   [place] N/A 09/09/2015   Procedure: PLACEMENT OF SETON;  Surgeon: Leighton Ruff, MD;  Location: [facility];  Service: General;  Laterality: N/A;   PORT-A-CATH REMOVAL Right 08/17/2014   Procedure: REMOVAL INTRAPERITONEAL CHEMO PORT;  Surgeon: Fanny Skates, MD;  Location: [place];  Service: General;  Laterality: Right;   PORTACATH PLACEMENT Right 08/17/2014   Procedure:  PLACE NEW PORT A CATH;  Surgeon: Fanny Skates, MD;  Location: [place];  Service: General;  Laterality: Right;   RECTAL BIOPSY N/A 09/09/2015   Procedure: BIOPSY OF RECTOVAGINAL MASS;  Surgeon: Leighton Ruff, MD;  Location: [place];  Service: General;  Laterality: N/A;   TRANSURETHRAL RESECTION OF BLADDER TUMOR N/A 02/27/2022   Procedure: TRANSURETHRAL RESECTION OF BLADDER TUMOR (TURBT);  Surgeon: Cleon Gustin, MD;  [facility];  Service: Urology;  Laterality: N/A;   TUBAL LIGATION  YRS AGO   VAGINAL HYSTERECTOMY  1981   fibroids    OB History     Gravida  3   Para  3   Term      Preterm      AB      Living  3      SAB      IAB      Ectopic      Multiple      Live Births  3            Home Medications    Prior to Admission medications   Medication Sig Start Date End Date Taking? Authorizing Provider  tizanidine (ZANAFLEX) 2 MG capsule Take 1 capsule (2 mg total) by mouth 3 (three) times daily as needed for muscle spasms. Do not drink alcohol or drive while taking this medication, may cause drowsiness 07/12/22  Yes Volney American, PA-C  amiodarone (PACERONE) 200 MG tablet Take 1 tablet (200 mg total) by mouth daily. 06/21/22   Rozann Lesches  G, MD  Apoaequorin (PREVAGEN EXTRA STRENGTH) 20 MG CAPS Take by mouth daily.    [provider]  Aspirin-Salicylamide-Caffeine (BC HEADACHE POWDER PO) Take 1-2 Packages by mouth daily at 12 noon.    [provider]  b complex vitamins tablet Take 1 tablet by mouth daily.    [provider]  Cholecalciferol (VITAMIN D-3) 125 MCG (5000 UT) TABS Take 5,000 Units by mouth daily.    [provider]  clotrimazole (LOTRIMIN) 1 % cream Apply 1 application topically 2 (two) times daily as needed (itching around ankles).    [provider]  Coenzyme Q10 (CO Q 10) 100 MG CAPS Take 100 mg by mouth daily.    [provider]  divalproex (DEPAKOTE ER) 500 MG 24 hr tablet Take 1 tablet (500 mg total) by mouth daily. 12/19/21   [name], MD  ELIQUIS 5 MG TABS tablet TAKE 1 TABLET BY MOUTH TWICE A DAY 07/12/22   Susy Frizzle, MD  escitalopram (LEXAPRO) 20 MG tablet Take 1 tablet (20 mg total) by mouth daily. 12/21/21   [name], MD  estradiol (ESTRACE) 0.1 MG/GM vaginal cream Apply a pea size amount of cream to urethral area of vagina twice weekly 03/03/22    [place], Berneice Heinrich, PA-C  Ferrous Gluconate (IRON 27 PO) Take 27 mg by mouth daily.    [provider]  fluorometholone (FML) 0.1 % ophthalmic suspension Place 1 drop into both eyes daily as needed (Red eyes). Patient not taking: Reported on 07/04/2022 01/19/22   [provider]  Homeopathic Products ([place]) Apply 1 application. topically daily as needed (joint pain).    [provider]  Javier Docker Oil 350 MG CAPS Take 350 mg by mouth daily.    [provider]  LORazepam (ATIVAN) 0.5 MG tablet TAKE 1 TABLET (0.5 MG TOTAL) BY MOUTH 3 (THREE) TIMES DAILY AS NEEDED FOR ANXIETY. 06/19/22   [name], MD  Multiple Vitamin (MULTIVITAMIN) tablet Take 1 tablet by mouth daily.    [provider]  Multiple Vitamins-Minerals (HAIR SKIN & NAILS ADVANCED PO) Take 3 tablets by mouth daily.    [provider]  omeprazole (PRILOSEC) 40 MG capsule TAKE 1 CAPSULE (40 MG TOTAL) BY MOUTH DAILY. 01/02/22   Susy Frizzle, MD  ondansetron (ZOFRAN) 4 MG tablet Take 1 tablet (4 mg total) by mouth daily as needed for nausea or vomiting. 11/03/21 11/03/22  Cleon Gustin, MD  oxyCODONE-acetaminophen (PERCOCET) 7.5-325 MG tablet Take 1 tablet by mouth every 4 (four) hours as needed for severe pain. 06/30/22   Susy Frizzle, MD  polyethylene glycol (MIRALAX / GLYCOLAX) 17 g packet Take 17 g by mouth daily. 05/28/20   Barton Dubois, MD  predniSONE (DELTASONE) 20 MG tablet 3 tabs poqday 1-2, 2 tabs poqday 3-4, 1 tab poqday 5-6 07/04/22   Susy Frizzle, MD  rosuvastatin (CRESTOR) 20 MG tablet Take 1 tablet (20 mg total) by mouth daily. 06/30/21   Roxan Hockey, MD  senna-docusate (SENOKOT S) 8.6-50 MG tablet Take 1 tablet by mouth daily. Patient not taking: Reported on 07/04/2022 02/28/22   [place]lin, Berneice Heinrich, PA-C    Family History Family History  Problem Relation Age of Onset   Bipolar disorder Mother    Anxiety disorder Mother     Dementia Mother    Depression Mother    Mental illness Mother    Vision loss Mother    Alzheimer's disease Mother  Alcohol abuse Paternal Uncle    Bipolar disorder Maternal Grandmother    Dementia Maternal Grandmother    Alzheimer's disease Maternal Grandmother    Alcohol abuse Paternal Uncle    Stroke Brother    Deep vein thrombosis Son    Heart disease Father    Hyperlipidemia Father    Hypertension Father    Stroke Father    Vision loss Father    Atrial fibrillation Sister    Heart disease Brother    Heart disease Brother    ADD / ADHD Neg Hx    Drug abuse Neg Hx    OCD Neg Hx    Paranoid behavior Neg Hx    Schizophrenia Neg Hx    Seizures Neg Hx    Sexual abuse Neg Hx    Physical abuse Neg Hx     Social History Social History   Tobacco Use   Smoking status: Former    Packs/day: 0.50    Years: 15.00    Total pack years: 7.50    Types: Cigarettes    Quit date: 02/16/2018    Years since quitting: 4.4   Smokeless tobacco: Never   Tobacco comments:    half pack a day for 15 yrs  Vaping Use   Vaping Use: Never used  Substance Use Topics   Alcohol use: No   Drug use: No     Allergies   Morphine and related   Review of Systems Review of Systems Per HPI  Physical Exam Triage Vital Signs ED Triage Vitals  Enc Vitals Group     BP 07/12/22 0823 (!) 152/75     Pulse Rate 07/12/22 0823 90     Resp --      Temp 07/12/22 0823 98.3 F (36.8 C)     Temp Source 07/12/22 0823 Oral     SpO2 07/12/22 0823 94 %     Weight --      Height --      Head Circumference --      Peak Flow --      Pain Score 07/12/22 0829 8     Pain Loc --      Pain Edu? --      Excl. in [place]? --    No data found.  Updated Vital Signs BP (!) 152/75 (BP Location: Right Arm)   Pulse 90   Temp 98.3 F (36.8 C) (Oral)   SpO2 94%   Visual Acuity Right Eye Distance:   Left Eye Distance:   Bilateral Distance:    Right Eye Near:   Left Eye Near:    Bilateral Near:      Physical Exam Vitals and nursing note reviewed.  Constitutional:      Appearance: Normal appearance. She is not ill-appearing.  HENT:     Head: Atraumatic.     Mouth/Throat:     Mouth: Mucous membranes are moist.  Eyes:     Extraocular Movements: Extraocular movements intact.     Conjunctiva/sclera: Conjunctivae normal.  Cardiovascular:     Rate and Rhythm: Normal rate and regular rhythm.     Heart sounds: Normal heart sounds.  Pulmonary:     Effort: Pulmonary effort is normal.     Breath sounds: Normal breath sounds.  Musculoskeletal:        General: Tenderness present. No swelling or deformity. Normal range of motion.     Cervical back: Normal range of motion and neck supple.     Comments: Left posterior  buttock ttp without deformity palpable. ROM and strength intact to the left hip  Skin:    General: Skin is warm and dry.     Findings: No bruising.  Neurological:     Mental Status: She is alert and oriented to person, place, and time.     Motor: No weakness.     Gait: Gait normal.     Comments: Left LE neurovascularly intact  Psychiatric:        Mood and Affect: Mood normal.        Thought Content: Thought content normal.        Judgment: Judgment normal.    UC Treatments / Results  Labs (all labs ordered are listed, but only abnormal results are displayed) Labs Reviewed - No data to display  EKG   Radiology No results found.  Procedures Procedures (including critical care time)  Medications Ordered in UC Medications - No data to display  Initial Impression / Assessment and Plan / UC Course  I have reviewed the triage vital signs and the nursing notes.  Pertinent labs & imaging results that were available during my care of the patient were reviewed by me and considered in my medical decision making (see chart for details).     Suspect her left hip pain may be related to overcorrection accommodation during her right hip pain from fall weeks ago.   Shared decision making used to defer x-ray imaging today as very low suspicion for any bony abnormality.  Suspect muscular pain, treat with Zanaflex, over-the-counter pain relievers, warm compresses, stretches, massage.  Follow-up with PCP for recheck.  Final Clinical Impressions(s) / UC Diagnoses   Final diagnoses:  Left hip pain   Discharge Instructions   None    ED Prescriptions     Medication Sig Dispense Auth. Provider   tizanidine (ZANAFLEX) 2 MG capsule Take 1 capsule (2 mg total) by mouth 3 (three) times daily as needed for muscle spasms. Do not drink alcohol or drive while taking this medication, may cause drowsiness 15 capsule Volney American, [place]      PDMP not reviewed this encounter.   Volney American, [place] 07/12/22 1319

## 2022-07-12 NOTE — Telephone Encounter (Signed)
Requested Prescriptions  Pending Prescriptions Disp Refills  . ELIQUIS 5 MG TABS tablet [Pharmacy Med Name: ELIQUIS 5 MG TABLET] 180 tablet 0    Sig: TAKE 1 TABLET BY MOUTH TWICE A DAY     Hematology:  Anticoagulants - apixaban Failed - 07/12/2022  1:46 AM      Failed - HGB in normal range and within 360 days    Hemoglobin  Date Value Ref Range Status  02/01/2022 11.2 (L) 12.0 - 15.0 g/dL Final         Failed - HCT in normal range and within 360 days    HCT  Date Value Ref Range Status  02/01/2022 33.6 (L) 36.0 - 46.0 % Final         Passed - PLT in normal range and within 360 days    Platelets  Date Value Ref Range Status  02/01/2022 214 150 - 400 K/uL Final         Passed - Cr in normal range and within 360 days    Creat  Date Value Ref Range Status  10/06/2021 0.68 0.60 - 1.00 mg/dL Final   Creatinine, Ser  Date Value Ref Range Status  02/01/2022 0.89 0.44 - 1.00 mg/dL Final         Passed - AST in normal range and within 360 days    AST  Date Value Ref Range Status  02/01/2022 24 15 - 41 U/L Final         Passed - ALT in normal range and within 360 days    ALT  Date Value Ref Range Status  02/01/2022 14 0 - 44 U/L Final         Passed - Valid encounter within last 12 months    Recent Outpatient Visits          9 months ago Hematuria, unspecified type   [place] Susy Frizzle, MD   9 months ago Hematuria, unspecified type   [place] Susy Frizzle, MD   9 months ago Hematuria, unspecified type   [facility] Eulogio Bear, NP   10 months ago Atrial fibrillation with RVR ([place])   [place] Pickard, Cammie Mcgee, MD   1 year ago Rapid heart beat   [place] Pickard, Cammie Mcgee, MD      Future Appointments            In 2 weeks Marybelle Killings, MD [place]   In 4 weeks Derek Jack, MD [place] at [facility]

## 2022-07-12 NOTE — Telephone Encounter (Signed)
Spoke w/pt and husband today regarding [id number]? Pt is upset due pt is now out of meds.   Last  refill: 06/30/22 for QTY#30  Please advice

## 2022-07-13 ENCOUNTER — Encounter: Payer: Self-pay | Admitting: Family Medicine

## 2022-07-14 ENCOUNTER — Encounter: Payer: Self-pay | Admitting: Family Medicine

## 2022-07-17 ENCOUNTER — Other Ambulatory Visit (HOSPITAL_COMMUNITY): Payer: Self-pay | Admitting: Psychiatry

## 2022-07-17 MED ORDER — LORAZEPAM 0.5 MG PO TABS: 0.5000 mg | ORAL_TABLET | Freq: Three times a day (TID) | ORAL | 0 refills | Status: DC | PRN

## 2022-07-17 NOTE — Telephone Encounter (Signed)
Pls advice  

## 2022-07-18 ENCOUNTER — Telehealth (INDEPENDENT_AMBULATORY_CARE_PROVIDER_SITE_OTHER): Payer: Medicare Other | Admitting: Psychiatry

## 2022-07-18 ENCOUNTER — Encounter (HOSPITAL_COMMUNITY): Payer: Self-pay | Admitting: Psychiatry

## 2022-07-18 DIAGNOSIS — F319 Bipolar disorder, unspecified: Secondary | ICD-10-CM

## 2022-07-18 MED ORDER — LORAZEPAM 0.5 MG PO TABS: 0.5000 mg | ORAL_TABLET | Freq: Three times a day (TID) | ORAL | 3 refills | Status: DC | PRN

## 2022-07-18 MED ORDER — DIVALPROEX SODIUM ER 500 MG PO TB24: 500.0000 mg | ORAL_TABLET | Freq: Every day | ORAL | 3 refills | Status: DC

## 2022-07-18 MED ORDER — ESCITALOPRAM OXALATE 20 MG PO TABS: 20.0000 mg | ORAL_TABLET | Freq: Every day | ORAL | 3 refills | Status: DC

## 2022-07-18 NOTE — Progress Notes (Signed)
Virtual Visit via Telephone Note  I connected with Abigail Wagner on 07/18/22 at 11:20 AM EDT by telephone and verified that I am speaking with the correct person using two identifiers.  Location: Patient: home Provider: office   I discussed the limitations, risks, security and privacy concerns of performing an evaluation and management service by telephone and the availability of in person appointments. I also discussed with the patient that there may be a patient responsible charge related to this service. The patient expressed understanding and agreed to proceed.      I discussed the assessment and treatment plan with the patient. The patient was provided an opportunity to ask questions and all were answered. The patient agreed with the plan and demonstrated an understanding of the instructions.   The patient was advised to call back or seek an in-person evaluation if the symptoms worsen or if the condition fails to improve as anticipated.  I provided 15 minutes of non-face-to-face time during this encounter.   Abigail Spiller, MD  [facility] MD/PA/NP OP Progress Note  07/18/2022 11:38 AM Abigail Wagner Abigail Wagner  MRN:  [id number]  Chief Complaint:  Chief Complaint  Patient presents with   Anxiety   Depression   Follow-up   HPI: This patient is a 71 year old married white female lives with her husband in [place]. She has 3 children and 5 grandchildren. She is retired from [employer].   The patient states she's had depression since her 39s. She was hospitalized several times, last time being in 2010 when she took a drug overdose. She was going through a lot of family stress back then. She states that she was hospitalized [facility] and since then she has never wanted to go back. She's been quite stable despite her low doses of medication. She's also stopped drinking alcohol which is made a big difference  The patient returns for follow-up after about 7 months.  Overall she states  she is doing okay in terms of her mood.  She denies significant depression or anxiety.  She is sleeping well.  She is not having any mood swings or manic symptoms such as racing thoughts agitation or hyperactivity.  She denies thoughts of self-harm or suicidal ideation.  She is struggling with cold symptoms right now and I urged her to call her primary doctor.  She is also dealing with chronic hip pain.  She is running out of pain medicine early and I urged her to also contact her PCP about a referral to pain management. Visit Diagnosis:    ICD-10-CM   1. Bipolar 1 disorder ([place])  F31.9       Past Psychiatric History: Past hospitalizations for depression and alcohol abuse, none since 2010  Past Medical History:  Past Medical History:  Diagnosis Date   Allergy    Anal carcinoma ([place])    Chemo and radiation   Anemia    Anxiety    Dr. Harrington Challenger at [facility] (psychiatrist).     Blood transfusion without reported diagnosis 2017   Carotid artery disease ([place])    Chronic kidney disease    DDD (degenerative disc disease), lumbosacral    GERD (gastroesophageal reflux disease)    Headache(784.0)    History of adenomatous polyp of colon 10/07/2015   Tubular adenoma high grade dysplasia   History of herpes genitalis    History of kidney stones    History of suicide attempt 2010   HTN (hypertension)    Hyperlipidemia  IBS (irritable bowel syndrome)    Major depression    OA (osteoarthritis)    Ovarian cancer ([place])    Stage IIIB papillary ovarian carcinoma  s/p omentectomy and BSO & chemotherapy (completed 12-07-2014)   Paroxysmal atrial fibrillation (HCC)    Prolapse of vaginal vault after hysterectomy 07/20/2015   Rectovaginal fistula 08/05/2015   Seasonal allergies    Sigmoid diverticulosis    Smokers' cough [facility])     Past Surgical History:  Procedure Laterality Date   BIOPSY  10/12/2020   Procedure: BIOPSY;  Surgeon: Harvel Quale, MD;  [facility];  Service:  Gastroenterology;;   [place]   COLONOSCOPY N/A 10/07/2015   Procedure: COLONOSCOPY;  Surgeon: Rogene Houston, MD;  [facility];  Service: Endoscopy;  Laterality: N/A;  7:30   COLONOSCOPY WITH PROPOFOL N/A 10/12/2020   Procedure: COLONOSCOPY WITH PROPOFOL;  Surgeon: Harvel Quale, MD;  [facility];  Service: Gastroenterology;  Laterality: N/A;  9:00   [place] N/A 02/27/2022   Procedure: CYSTOSCOPY;  Surgeon: Cleon Gustin, MD;  Location: [facility];  Service: Urology;  Laterality: N/A;   CYSTOSCOPY WITH FULGERATION N/A 11/03/2021   Procedure: CYSTOSCOPY WITH FULGERATION;  Surgeon: Cleon Gustin, MD;  Location: [facility];  Service: Urology;  Laterality: N/A;   ESOPHAGOGASTRODUODENOSCOPY (EGD) WITH PROPOFOL N/A 10/12/2020   Procedure: ESOPHAGOGASTRODUODENOSCOPY (EGD) WITH PROPOFOL;  Surgeon: Harvel Quale, MD;  [facility];  Service: Gastroenterology;  Laterality: N/A;   EXPLORATORY LAPAROTOMY/ OMENTECTOMY/  BILATERAL SALPINGOOPHORECTOMY/  PORT-A-CATH PLACEMENT  07-03-2014   [place]   KNEE ARTHROSCOPY Left 09/22/2004   LAPAROSCOPY N/A 06/02/2014   Procedure: DIAGNOSTIC LAPAROSCOPY, OMENTAL BIOPSY, RIGHT OVARY BIOPSY, LYSIS OF ADHESIONS;  Surgeon: Fanny Skates, MD;  Location: [place];  Service: General;  Laterality: N/A;   MUCOSAL ADVANCEMENT FLAP N/A 06/15/2016   Procedure: EXCISION RECTOVAGINAOL FISTULA WITH MUCOSAL ADVANCEMENT FLAP;  Surgeon: Leighton Ruff, MD;  Location: [place];  Service: General;  Laterality: N/A;   [place] N/A 09/09/2015   Procedure: PLACEMENT OF SETON;  Surgeon: Leighton Ruff, MD;  Location: [facility];  Service: General;  Laterality: N/A;   PORT-A-CATH REMOVAL Right 08/17/2014   Procedure: REMOVAL INTRAPERITONEAL CHEMO PORT;  Surgeon: Fanny Skates, MD;   Location: [place];  Service: General;  Laterality: Right;   PORTACATH PLACEMENT Right 08/17/2014   Procedure:  PLACE NEW PORT A CATH;  Surgeon: Fanny Skates, MD;  Location: [place];  Service: General;  Laterality: Right;   RECTAL BIOPSY N/A 09/09/2015   Procedure: BIOPSY OF RECTOVAGINAL MASS;  Surgeon: Leighton Ruff, MD;  Location: [place];  Service: General;  Laterality: N/A;   TRANSURETHRAL RESECTION OF BLADDER TUMOR N/A 02/27/2022   Procedure: TRANSURETHRAL RESECTION OF BLADDER TUMOR (TURBT);  Surgeon: Cleon Gustin, MD;  Location[facility];  Service: Urology;  Laterality: N/A;   TUBAL LIGATION  YRS AGO   VAGINAL HYSTERECTOMY  1981   fibroids    Family Psychiatric History: See below  Family History:  Family History  Problem Relation Age of Onset   Bipolar disorder Mother    Anxiety disorder Mother    Dementia Mother    Depression Mother    Mental illness Mother    Vision loss Mother    Alzheimer's disease  Mother    Alcohol abuse Paternal Uncle    Bipolar disorder Maternal Grandmother    Dementia Maternal Grandmother    Alzheimer's disease Maternal Grandmother    Alcohol abuse Paternal Uncle    Stroke Brother    Deep vein thrombosis Son    Heart disease Father    Hyperlipidemia Father    Hypertension Father    Stroke Father    Vision loss Father    Atrial fibrillation Sister    Heart disease Brother    Heart disease Brother    ADD / ADHD Neg Hx    Drug abuse Neg Hx    OCD Neg Hx    Paranoid behavior Neg Hx    Schizophrenia Neg Hx    Seizures Neg Hx    Sexual abuse Neg Hx    Physical abuse Neg Hx     Social History:  Social History   Socioeconomic History   Marital status: Married    Spouse name: Coralyn Mark   Number of children: 4   Years of education: 12   Highest education level: Not on file  Occupational History   Occupation: retire    Comment: [employer]  Tobacco Use   Smoking status: Former     Packs/day: 0.50    Years: 15.00    Total pack years: 7.50    Types: Cigarettes    Quit date: 02/16/2018    Years since quitting: 4.4   Smokeless tobacco: Never   Tobacco comments:    half pack a day for 15 yrs  Vaping Use   Vaping Use: Never used  Substance and Sexual Activity   Alcohol use: No   Drug use: No   Sexual activity: Not Currently    Birth control/protection: Surgical    Comment: hyst  Other Topics Concern   Not on file  Social History Narrative   Lives at home with Coralyn Mark   Retired [employer]   Married 02/16/1990   Several grandchildren    Social Determinants of Health   Financial Resource Strain: Low Risk  (01/27/2022)   Overall Financial Resource Strain (CARDIA)    Difficulty of Paying Living Expenses: Not hard at all  Food Insecurity: No Food Insecurity (01/27/2022)   Hunger Vital Sign    Worried About Running Out of Food in the Last Year: Never true    Ran Out of Food in the Last Year: Never true  Transportation Needs: No Transportation Needs (01/27/2022)   PRAPARE - Hydrologist (Medical): No    Lack of Transportation (Non-Medical): No  Physical Activity: Insufficiently Active (01/27/2022)   Exercise Vital Sign    Days of Exercise per Week: 3 days    Minutes of Exercise per Session: 20 min  Stress: No Stress Concern Present (01/27/2022)   [place]    Feeling of Stress : Not at all  Social Connections: [place] (01/27/2022)   Social Connection and Isolation Panel [NHANES]    Frequency of Communication with Friends and Family: More than three times a week    Frequency of Social Gatherings with Friends and Family: More than three times a week    Attends Religious Services: More than 4 times per year    Active Member of [employer] or Organizations: Yes    Attends Music therapist: More than 4 times per year    Marital Status: Married    Allergies:   Allergies  Allergen  Reactions   Morphine And Related     Delirium with sbo    Metabolic Disorder Labs: No results found for: "HGBA1C", "MPG" No results found for: "PROLACTIN" Lab Results  Component Value Date   CHOL 140 06/29/2021   TRIG 76 06/29/2021   HDL 51 06/29/2021   CHOLHDL 2.7 06/29/2021   VLDL 15 06/29/2021   LDLCALC 74 06/29/2021   LDLCALC 80 01/10/2021   Lab Results  Component Value Date   TSH 1.470 06/21/2022   TSH 1.82 03/07/2021    Therapeutic Level Labs: No results found for: "LITHIUM" No results found for: "VALPROATE" No results found for: "CBMZ"  Current Medications: Current Outpatient Medications  Medication Sig Dispense Refill   amiodarone (PACERONE) 200 MG tablet Take 1 tablet (200 mg total) by mouth daily. 90 tablet 3   Apoaequorin (PREVAGEN EXTRA STRENGTH) 20 MG CAPS Take by mouth daily.     Aspirin-Salicylamide-Caffeine (BC HEADACHE POWDER PO) Take 1-2 Packages by mouth daily at 12 noon.     b complex vitamins tablet Take 1 tablet by mouth daily.     Cholecalciferol (VITAMIN D-3) 125 MCG (5000 UT) TABS Take 5,000 Units by mouth daily.     clotrimazole (LOTRIMIN) 1 % cream Apply 1 application topically 2 (two) times daily as needed (itching around ankles).     Coenzyme Q10 (CO Q 10) 100 MG CAPS Take 100 mg by mouth daily.     divalproex (DEPAKOTE ER) 500 MG 24 hr tablet Take 1 tablet (500 mg total) by mouth daily. 90 tablet 3   ELIQUIS 5 MG TABS tablet TAKE 1 TABLET BY MOUTH TWICE A DAY 180 tablet 0   escitalopram (LEXAPRO) 20 MG tablet Take 1 tablet (20 mg total) by mouth daily. 30 tablet 3   estradiol (ESTRACE) 0.1 MG/GM vaginal cream Apply a pea size amount of cream to urethral area of vagina twice weekly 42.5 g 12   Ferrous Gluconate (IRON 27 PO) Take 27 mg by mouth daily.     fluorometholone (FML) 0.1 % ophthalmic suspension Place 1 drop into both eyes daily as needed (Red eyes). (Patient not taking: Reported on 07/04/2022)     Homeopathic  Products ([place]) Apply 1 application. topically daily as needed (joint pain).     Krill Oil 350 MG CAPS Take 350 mg by mouth daily.     LORazepam (ATIVAN) 0.5 MG tablet Take 1 tablet (0.5 mg total) by mouth 3 (three) times daily as needed for anxiety. 90 tablet 3   Multiple Vitamin (MULTIVITAMIN) tablet Take 1 tablet by mouth daily.     Multiple Vitamins-Minerals (HAIR SKIN & NAILS ADVANCED PO) Take 3 tablets by mouth daily.     omeprazole (PRILOSEC) 40 MG capsule TAKE 1 CAPSULE (40 MG TOTAL) BY MOUTH DAILY. 90 capsule 3   ondansetron (ZOFRAN) 4 MG tablet Take 1 tablet (4 mg total) by mouth daily as needed for nausea or vomiting. 30 tablet 1   oxyCODONE-acetaminophen (PERCOCET) 7.5-325 MG tablet Take 1 tablet by mouth every 4 (four) hours as needed for severe pain. 30 tablet 0   polyethylene glycol (MIRALAX / GLYCOLAX) 17 g packet Take 17 g by mouth daily. 30 each 1   predniSONE (DELTASONE) 20 MG tablet 3 tabs poqday 1-2, 2 tabs poqday 3-4, 1 tab poqday 5-6 12 tablet 0   rosuvastatin (CRESTOR) 20 MG tablet Take 1 tablet (20 mg total) by mouth daily. 90 tablet 3   senna-docusate (SENOKOT S) 8.6-50 MG tablet Take  1 tablet by mouth daily. (Patient not taking: Reported on 07/04/2022) 20 tablet 0   tizanidine (ZANAFLEX) 2 MG capsule Take 1 capsule (2 mg total) by mouth 3 (three) times daily as needed for muscle spasms. Do not drink alcohol or drive while taking this medication, may cause drowsiness 15 capsule 0   No current facility-administered medications for this visit.     Musculoskeletal: Strength & Muscle Tone: na Gait & Station: na Patient leans: N/A  Psychiatric Specialty Exam: Review of Systems  HENT:  Positive for congestion.   Musculoskeletal:  Positive for arthralgias and back pain.  All other systems reviewed and are negative.   There were no vitals taken for this visit.There is no height or weight on file to calculate BMI.  General Appearance: NA  Eye Contact:   NA  Speech:  Clear and Coherent  Volume:  Normal  Mood:  Euthymic  Affect:  NA  Thought Process:  Goal Directed  Orientation:  Full (Time, Place, and Person)  Thought Content: Rumination   Suicidal Thoughts:  No  Homicidal Thoughts:  No  Memory:  Immediate;   Good Recent;   Fair Remote;   NA  Judgement:  Fair  Insight:  Fair  Psychomotor Activity:  Decreased  Concentration:  Concentration: Fair and Attention Span: Fair  Recall:  [employer] of Knowledge: Fair  Language: Good  Akathisia:  No  Handed:  Right  AIMS (if indicated): not done  Assets:  Communication Skills Desire for Improvement Resilience Social Support  ADL's:  Intact  Cognition: WNL  Sleep:  Good   Screenings: GAD-7    Flowsheet Row Clinical Support from 01/10/2021 in [place]  Total GAD-7 Score 8      PHQ2-9    [place] Office Visit from 07/04/2022 in [place] from 01/27/2022 in [place] Video Visit from 12/21/2021 in [facility] Video Visit from 07/26/2021 in [facility] [facility] Video Visit from 05/27/2021 in [facility]  PHQ-2 Total Score 4 0 0 1 1  PHQ-9 Total Score 7 -- -- 3 3      Flowsheet Row ED from 07/12/2022 in [facility] Admission (Discharged) from 02/27/2022 in [place] Video Visit from 12/21/2021 in [place] No Risk No Risk No Risk        Assessment and Plan: This patient is a 71 year old female with a history depression anxiety diverticulitis atrial fibrillation and chronic pain.  For now she is doing well on her current regimen so she will continue Lexapro 20 mg daily for depression, Ativan 0.5 mg 3 times daily as needed for anxiety and Depakote ER 500 mg at bedtime for mood  stabilization.  She will return to see me in 4 months  Collaboration of Care: Collaboration of Care: Primary Care Provider AEB notes are shared with PCP on the epic system  Patient/Guardian was advised Release of Information must be obtained prior to any record release in order to collaborate their care with an outside provider. Patient/Guardian was advised if they have not already done so to contact the registration department to sign all necessary forms in order for [place] to release information regarding their care.   Consent: Patient/Guardian gives verbal consent for treatment and assignment of benefits for services provided during this visit. Patient/Guardian expressed understanding and agreed to proceed.  Abigail Spiller, MD 07/18/2022, 11:39 AM

## 2022-07-18 NOTE — Telephone Encounter (Signed)
Appt today 07/18/22

## 2022-07-25 ENCOUNTER — Ambulatory Visit (INDEPENDENT_AMBULATORY_CARE_PROVIDER_SITE_OTHER): Payer: Medicare Other | Admitting: Family Medicine

## 2022-07-25 ENCOUNTER — Encounter: Payer: Self-pay | Admitting: Family Medicine

## 2022-07-25 VITALS — BP 110/56 | HR 94 | Ht 65.0 in | Wt 115.0 lb

## 2022-07-25 DIAGNOSIS — J189 Pneumonia, unspecified organism: Secondary | ICD-10-CM

## 2022-07-25 MED ORDER — ALBUTEROL SULFATE HFA 108 (90 BASE) MCG/ACT IN AERS: 2.0000 | INHALATION_SPRAY | Freq: Four times a day (QID) | RESPIRATORY_TRACT | 0 refills | Status: DC | PRN

## 2022-07-25 MED ORDER — AMOXICILLIN-POT CLAVULANATE 875-125 MG PO TABS: 1.0000 | ORAL_TABLET | Freq: Two times a day (BID) | ORAL | 0 refills | Status: DC

## 2022-07-25 MED ORDER — PREDNISONE 20 MG PO TABS: ORAL_TABLET | ORAL | 0 refills | Status: DC

## 2022-07-25 NOTE — Progress Notes (Signed)
Subjective:    Patient ID: Abigail Wagner, female    DOB: [birth year], 71 y.o.   MRN: [id number] Patient states that she has been coughing her head off for the last 2 weeks.  She is done home COVID test which are negative.  She denies any fever but she does report shortness of breath and purulent sputum.  She denies any hemoptysis or chest pain.  She does report sinus pressure and sinus pain and postnasal drip.  Today on examination she has diffuse expiratory wheezing in all 4 lung fields and she has prominent right basilar crackles with right-sided rhonchi. Past Medical History:  Diagnosis Date   Allergy    Anal carcinoma ([place])    Chemo and radiation   Anemia    Anxiety    Dr. Harrington Challenger at [facility] (psychiatrist).     Blood transfusion without reported diagnosis 2017   Carotid artery disease (HCC)    Chronic kidney disease    DDD (degenerative disc disease), lumbosacral    GERD (gastroesophageal reflux disease)    Headache(784.0)    History of adenomatous polyp of colon 10/07/2015   Tubular adenoma high grade dysplasia   History of herpes genitalis    History of kidney stones    History of suicide attempt 2010   HTN (hypertension)    Hyperlipidemia    IBS (irritable bowel syndrome)    Major depression    OA (osteoarthritis)    Ovarian cancer ([place])    Stage IIIB papillary ovarian carcinoma  s/p omentectomy and BSO & chemotherapy (completed 12-07-2014)   Paroxysmal atrial fibrillation (HCC)    Prolapse of vaginal vault after hysterectomy 07/20/2015   Rectovaginal fistula 08/05/2015   Seasonal allergies    Sigmoid diverticulosis    Smokers' cough [facility])     Past Surgical History:  Procedure Laterality Date   BIOPSY  10/12/2020   Procedure: BIOPSY;  Surgeon: Harvel Quale, MD;  [facility];  Service: Gastroenterology;;   [place]   COLONOSCOPY N/A 10/07/2015   Procedure: COLONOSCOPY;   Surgeon: Rogene Houston, MD;  [facility];  Service: Endoscopy;  Laterality: N/A;  7:30   COLONOSCOPY WITH PROPOFOL N/A 10/12/2020   Procedure: COLONOSCOPY WITH PROPOFOL;  Surgeon: Harvel Quale, MD;  [facility];  Service: Gastroenterology;  Laterality: N/A;  9:00   [place] N/A 02/27/2022   Procedure: CYSTOSCOPY;  Surgeon: Cleon Gustin, MD;  Location: [facility];  Service: Urology;  Laterality: N/A;   CYSTOSCOPY WITH FULGERATION N/A 11/03/2021   Procedure: CYSTOSCOPY WITH FULGERATION;  Surgeon: Cleon Gustin, MD;  Location: [facility];  Service: Urology;  Laterality: N/A;   ESOPHAGOGASTRODUODENOSCOPY (EGD) WITH PROPOFOL N/A 10/12/2020   Procedure: ESOPHAGOGASTRODUODENOSCOPY (EGD) WITH PROPOFOL;  Surgeon: Harvel Quale, MD;  [facility];  Service: Gastroenterology;  Laterality: N/A;   EXPLORATORY LAPAROTOMY/ OMENTECTOMY/  BILATERAL SALPINGOOPHORECTOMY/  PORT-A-CATH PLACEMENT  07-03-2014   [place]   KNEE ARTHROSCOPY Left 09/22/2004   LAPAROSCOPY N/A 06/02/2014   Procedure: DIAGNOSTIC LAPAROSCOPY, OMENTAL BIOPSY, RIGHT OVARY BIOPSY, LYSIS OF ADHESIONS;  Surgeon: Fanny Skates, MD;  Location: [place];  Service: General;  Laterality: N/A;   MUCOSAL ADVANCEMENT FLAP N/A 06/15/2016   Procedure: EXCISION RECTOVAGINAOL FISTULA WITH MUCOSAL ADVANCEMENT FLAP;  Surgeon: Leighton Ruff, MD;  Location: [place];  Service: General;  Laterality:  N/A;   PLACEMENT OF SETON N/A 09/09/2015   Procedure: PLACEMENT OF SETON;  Surgeon: Leighton Ruff, MD;  Location: [facility];  Service: General;  Laterality: N/A;   PORT-A-CATH REMOVAL Right 08/17/2014   Procedure: REMOVAL INTRAPERITONEAL CHEMO PORT;  Surgeon: Fanny Skates, MD;  Location: [place];  Service: General;  Laterality: Right;   PORTACATH PLACEMENT Right 08/17/2014   Procedure:  PLACE NEW PORT A CATH;  Surgeon:  Fanny Skates, MD;  Location: [place];  Service: General;  Laterality: Right;   RECTAL BIOPSY N/A 09/09/2015   Procedure: BIOPSY OF RECTOVAGINAL MASS;  Surgeon: Leighton Ruff, MD;  Location: [place];  Service: General;  Laterality: N/A;   TRANSURETHRAL RESECTION OF BLADDER TUMOR N/A 02/27/2022   Procedure: TRANSURETHRAL RESECTION OF BLADDER TUMOR (TURBT);  Surgeon: Cleon Gustin, MD;  Location: [facility];  Service: Urology;  Laterality: N/A;   TUBAL LIGATION  YRS AGO   VAGINAL HYSTERECTOMY  1981   fibroids   Current Outpatient Medications on File Prior to Visit  Medication Sig Dispense Refill   amiodarone (PACERONE) 200 MG tablet Take 1 tablet (200 mg total) by mouth daily. 90 tablet 3   Apoaequorin (PREVAGEN EXTRA STRENGTH) 20 MG CAPS Take by mouth daily.     Aspirin-Salicylamide-Caffeine (BC HEADACHE POWDER PO) Take 1-2 Packages by mouth daily at 12 noon.     b complex vitamins tablet Take 1 tablet by mouth daily.     Cholecalciferol (VITAMIN D-3) 125 MCG (5000 UT) TABS Take 5,000 Units by mouth daily.     clotrimazole (LOTRIMIN) 1 % cream Apply 1 application topically 2 (two) times daily as needed (itching around ankles).     Coenzyme Q10 (CO Q 10) 100 MG CAPS Take 100 mg by mouth daily.     divalproex (DEPAKOTE ER) 500 MG 24 hr tablet Take 1 tablet (500 mg total) by mouth daily. 90 tablet 3   ELIQUIS 5 MG TABS tablet TAKE 1 TABLET BY MOUTH TWICE A DAY 180 tablet 0   escitalopram (LEXAPRO) 20 MG tablet Take 1 tablet (20 mg total) by mouth daily. 30 tablet 3   estradiol (ESTRACE) 0.1 MG/GM vaginal cream Apply a pea size amount of cream to urethral area of vagina twice weekly 42.5 g 12   Ferrous Gluconate (IRON 27 PO) Take 27 mg by mouth daily.     fluorometholone (FML) 0.1 % ophthalmic suspension Place 1 drop into both eyes daily as needed (Red eyes).     Homeopathic Products (THERAWORX RELIEF EX) Apply 1 application. topically daily as needed (joint  pain).     Krill Oil 350 MG CAPS Take 350 mg by mouth daily.     LORazepam (ATIVAN) 0.5 MG tablet Take 1 tablet (0.5 mg total) by mouth 3 (three) times daily as needed for anxiety. 90 tablet 3   Multiple Vitamin (MULTIVITAMIN) tablet Take 1 tablet by mouth daily.     Multiple Vitamins-Minerals (HAIR SKIN & NAILS ADVANCED PO) Take 3 tablets by mouth daily.     omeprazole (PRILOSEC) 40 MG capsule TAKE 1 CAPSULE (40 MG TOTAL) BY MOUTH DAILY. 90 capsule 3   ondansetron (ZOFRAN) 4 MG tablet Take 1 tablet (4 mg total) by mouth daily as needed for nausea or vomiting. 30 tablet 1   oxyCODONE-acetaminophen (PERCOCET) 7.5-325 MG tablet Take 1 tablet by mouth every 4 (four) hours as needed for severe pain. 30 tablet 0   polyethylene glycol (MIRALAX / GLYCOLAX)  17 g packet Take 17 g by mouth daily. 30 each 1   predniSONE (DELTASONE) 20 MG tablet 3 tabs poqday 1-2, 2 tabs poqday 3-4, 1 tab poqday 5-6 12 tablet 0   rosuvastatin (CRESTOR) 20 MG tablet Take 1 tablet (20 mg total) by mouth daily. 90 tablet 3   senna-docusate (SENOKOT S) 8.6-50 MG tablet Take 1 tablet by mouth daily. 20 tablet 0   tizanidine (ZANAFLEX) 2 MG capsule Take 1 capsule (2 mg total) by mouth 3 (three) times daily as needed for muscle spasms. Do not drink alcohol or drive while taking this medication, may cause drowsiness 15 capsule 0   No current facility-administered medications on file prior to visit.   Allergies  Allergen Reactions   Morphine And Related     Delirium with sbo   Social History   Socioeconomic History   Marital status: Married    Spouse name: Coralyn Mark   Number of children: 4   Years of education: 12   Highest education level: Not on file  Occupational History   Occupation: retire    Comment: [employer]  Tobacco Use   Smoking status: Former    Packs/day: 0.50    Years: 15.00    Total pack years: 7.50    Types: Cigarettes    Quit date: 02/16/2018    Years since quitting: 4.4   Smokeless tobacco: Never    Tobacco comments:    half pack a day for 15 yrs  Vaping Use   Vaping Use: Never used  Substance and Sexual Activity   Alcohol use: No   Drug use: No   Sexual activity: Not Currently    Birth control/protection: Surgical    Comment: hyst  Other Topics Concern   Not on file  Social History Narrative   Lives at home with Coralyn Mark   Retired [employer]   Married 02/16/1990   Several grandchildren    Social Determinants of Health   Financial Resource Strain: [place]  (01/27/2022)   Overall Financial Resource Strain (CARDIA)    Difficulty of Paying Living Expenses: Not hard at all  Food Insecurity: No Food Insecurity (01/27/2022)   Hunger Vital Sign    Worried About Running Out of Food in the Last Year: Never true    [place] in the Last Year: Never true  Transportation Needs: No Transportation Needs (01/27/2022)   PRAPARE - Hydrologist (Medical): No    Lack of Transportation (Non-Medical): No  Physical Activity: Insufficiently Active (01/27/2022)   Exercise Vital Sign    Days of Exercise per Week: 3 days    Minutes of Exercise per Session: 20 min  Stress: No Stress Concern Present (01/27/2022)   [place]    Feeling of Stress : Not at all  Social Connections: [place] (01/27/2022)   Social Connection and Isolation Panel [NHANES]    Frequency of Communication with Friends and Family: More than three times a week    Frequency of Social Gatherings with Friends and Family: More than three times a week    Attends Religious Services: More than 4 times per year    Active Member of [employer] or Organizations: Yes    Attends Archivist Meetings: More than 4 times per year    Marital Status: Married  Human resources officer Violence: Not At Risk (01/27/2022)   Humiliation, Afraid, Rape, and Kick questionnaire    Fear of  Current or Ex-Partner: No    Emotionally Abused: No    Physically  Abused: No    Sexually Abused: No   Family History  Problem Relation Age of Onset   Bipolar disorder Mother    Anxiety disorder Mother    Dementia Mother    Depression Mother    Mental illness Mother    Vision loss Mother    Alzheimer's disease Mother    Alcohol abuse Paternal Uncle    Bipolar disorder Maternal Grandmother    Dementia Maternal Grandmother    Alzheimer's disease Maternal Grandmother    Alcohol abuse Paternal Uncle    Stroke Brother    Deep vein thrombosis Son    Heart disease Father    Hyperlipidemia Father    Hypertension Father    Stroke Father    Vision loss Father    Atrial fibrillation Sister    Heart disease Brother    Heart disease Brother    ADD / ADHD Neg Hx    Drug abuse Neg Hx    OCD Neg Hx    Paranoid behavior Neg Hx    Schizophrenia Neg Hx    Seizures Neg Hx    Sexual abuse Neg Hx    Physical abuse Neg Hx       Review of Systems  Respiratory:  Positive for cough.        Objective:   Physical Exam Vitals reviewed.  Constitutional:      General: She is not in acute distress.    Appearance: She is not ill-appearing or diaphoretic.  Cardiovascular:     Rate and Rhythm: Normal rate and regular rhythm.     Heart sounds: Normal heart sounds. No murmur heard.    No friction rub. No gallop.  Pulmonary:     Effort: Pulmonary effort is normal. No respiratory distress.     Breath sounds: Decreased air movement present. No stridor. Examination of the right-upper field reveals wheezing. Examination of the left-upper field reveals wheezing. Examination of the right-lower field reveals wheezing, rhonchi and rales. Examination of the left-lower field reveals wheezing and rhonchi. Wheezing, rhonchi and rales present.  Abdominal:     General: Abdomen is flat. Bowel sounds are normal. There is no distension.     Palpations: Abdomen is soft. There is no mass.     Tenderness: There is no abdominal tenderness.  Musculoskeletal:     Right hip: No  tenderness or bony tenderness. Normal range of motion. Normal strength.     Left hip: Tenderness present. No bony tenderness or crepitus. Decreased range of motion. Normal strength.     Right upper leg: No deformity.     Left upper leg: Normal. No deformity.     Right lower leg: No edema.     Left lower leg: No edema.       Legs:  Neurological:     Mental Status: She is alert.         Assessment & Plan:  Community acquired pneumonia of right lower lobe of lung I am concerned about community-acquired pneumonia and at the very least a COPD exacerbation.  Due to her amiodarone I cannot use Levaquin.  I will use Augmentin 875 mg twice daily for 10 days, prednisone taper pack, and albuterol 2 puffs every 4-6 hours as needed.  Seek medical attention immediately if worsening.  Recheck in 48 hours if not improving

## 2022-07-27 ENCOUNTER — Ambulatory Visit: Payer: Medicare Other | Admitting: Orthopaedic Surgery

## 2022-07-28 ENCOUNTER — Other Ambulatory Visit: Payer: Self-pay | Admitting: Family Medicine

## 2022-07-31 MED ORDER — OXYCODONE-ACETAMINOPHEN 7.5-325 MG PO TABS: 1.0000 | ORAL_TABLET | Freq: Four times a day (QID) | ORAL | 0 refills | Status: DC | PRN

## 2022-08-02 ENCOUNTER — Inpatient Hospital Stay: Payer: Medicare Other

## 2022-08-09 ENCOUNTER — Ambulatory Visit: Payer: Medicare Other | Admitting: Hematology

## 2022-08-09 ENCOUNTER — Other Ambulatory Visit (HOSPITAL_COMMUNITY): Payer: Medicare Other

## 2022-08-15 ENCOUNTER — Telehealth (HOSPITAL_COMMUNITY): Payer: Medicare Other | Admitting: Psychiatry

## 2022-08-16 ENCOUNTER — Other Ambulatory Visit: Payer: Self-pay | Admitting: Family Medicine

## 2022-08-16 NOTE — Telephone Encounter (Signed)
Requested Prescriptions  Pending Prescriptions Disp Refills   albuterol (VENTOLIN HFA) 108 (90 Base) MCG/ACT inhaler [Pharmacy Med Name: ALBUTEROL HFA (PROAIR) INHALER] 8.5 each 0    Sig: TAKE 2 PUFFS BY MOUTH EVERY 6 HOURS AS NEEDED FOR WHEEZE OR SHORTNESS OF BREATH     Pulmonology:  Beta Agonists 2 Passed - 08/16/2022  1:48 AM      Passed - Last BP in normal range    BP Readings from Last 1 Encounters:  07/25/22 (!) 110/56         Passed - Last Heart Rate in normal range    Pulse Readings from Last 1 Encounters:  07/25/22 94         Passed - Valid encounter within last 12 months    Recent Outpatient Visits           10 months ago Hematuria, unspecified type   [place] Susy Frizzle, MD   10 months ago Hematuria, unspecified type   [place] Susy Frizzle, MD   10 months ago Hematuria, unspecified type   [place] Eulogio Bear, NP   11 months ago Atrial fibrillation with RVR ([place])   [place] Pickard, Cammie Mcgee, MD   1 year ago Rapid heart beat   [place] Pickard, Cammie Mcgee, MD

## 2022-08-18 ENCOUNTER — Other Ambulatory Visit (HOSPITAL_COMMUNITY): Payer: Self-pay | Admitting: Psychiatry

## 2022-08-21 ENCOUNTER — Other Ambulatory Visit (HOSPITAL_COMMUNITY): Payer: Self-pay | Admitting: Psychiatry

## 2022-08-22 ENCOUNTER — Telehealth: Payer: Self-pay | Admitting: Family Medicine

## 2022-08-22 NOTE — Telephone Encounter (Signed)
I have left vm with family member asking to have patient return my call. She is on our caregap for a mammogram. Her last one was at [facility] on 07/28/2019.   CB# [phone]

## 2022-08-22 NOTE — Telephone Encounter (Signed)
Patient returned call and declined appointment; stated she stopped doing mammograms due to her age. Nothing further needed at this time.

## 2022-08-23 NOTE — Telephone Encounter (Signed)
11/28:  Patient returned call and declined appointment; stated she stopped doing mammograms due to her age. Nothing further needed at this time.

## 2022-08-25 ENCOUNTER — Encounter (HOSPITAL_COMMUNITY): Payer: Self-pay

## 2022-08-25 ENCOUNTER — Other Ambulatory Visit (HOSPITAL_COMMUNITY): Payer: Self-pay | Admitting: Psychiatry

## 2022-08-25 ENCOUNTER — Other Ambulatory Visit: Payer: Self-pay | Admitting: Family Medicine

## 2022-08-25 NOTE — Telephone Encounter (Signed)
Requested medication (s) are due for refill today: routing for review  Requested medication (s) are on the active medication list:yes  Last refill: 06/30/21   Future visit scheduled: yes  Notes to clinic:  Unable to refill per protocol, last refill by another provider. Routing for review.     Requested Prescriptions  Pending Prescriptions Disp Refills   rosuvastatin (CRESTOR) 20 MG tablet [Pharmacy Med Name: ROSUVASTATIN CALCIUM 20 MG TAB] 90 tablet 3    Sig: TAKE 1 TABLET BY MOUTH EVERY DAY     Cardiovascular:  Antilipid - Statins 2 Failed - 08/25/2022 11:28 AM      Failed - Lipid Panel in normal range within the last 12 months    Cholesterol  Date Value Ref Range Status  06/29/2021 140 0 - 200 mg/dL Final   LDL Cholesterol (Calc)  Date Value Ref Range Status  01/10/2021 80 mg/dL (calc) Final    Comment:    Reference range: <100 . Desirable range <100 mg/dL for primary prevention;   <70 mg/dL for patients with CHD or diabetic patients  with > or = 2 CHD risk factors. Marland Kitchen LDL-C is now calculated using the Martin-Hopkins  calculation, which is a validated novel method providing  better accuracy than the Friedewald equation in the  estimation of LDL-C.  Cresenciano Genre et al. Annamaria Helling. 9211;941(74): 2061-2068  (http://education.[facility].com/faq/FAQ164)    LDL Cholesterol  Date Value Ref Range Status  06/29/2021 74 0 - 99 mg/dL Final    Comment:           Total Cholesterol/HDL:CHD Risk Coronary Heart Disease Risk Table                     Men   Women  1/2 Average Risk   3.4   3.3  Average Risk       5.0   4.4  2 X Average Risk   9.6   7.1  3 X Average Risk  23.4   11.0        Use the calculated Patient Ratio above and the CHD Risk Table to determine the patient's CHD Risk.        ATP III CLASSIFICATION (LDL):  <100     mg/dL   Optimal  100-129  mg/dL   Near or Above                    Optimal  130-159  mg/dL   Borderline  160-189  mg/dL   High  >190     mg/dL    Very High Performed at [facility], [address], [place], [place] [address]    HDL  Date Value Ref Range Status  06/29/2021 51 >40 mg/dL Final   Triglycerides  Date Value Ref Range Status  06/29/2021 76 <150 mg/dL Final         Passed - Cr in normal range and within 360 days    Creat  Date Value Ref Range Status  10/06/2021 0.68 0.60 - 1.00 mg/dL Final   Creatinine, Ser  Date Value Ref Range Status  02/01/2022 0.89 0.44 - 1.00 mg/dL Final         Passed - Patient is not pregnant      Passed - Valid encounter within last 12 months    Recent Outpatient Visits           10 months ago Hematuria, unspecified type   [place] Pickard, Cammie Mcgee, MD  10 months ago Hematuria, unspecified type   [place] Dennard Schaumann, Cammie Mcgee, MD   10 months ago Hematuria, unspecified type   [place] Eulogio Bear, NP   11 months ago Atrial fibrillation with RVR [facility])   [facility] Pickard, Cammie Mcgee, MD   1 year ago Rapid heart beat   [place] Pickard, Cammie Mcgee, MD

## 2022-08-28 ENCOUNTER — Encounter (HOSPITAL_COMMUNITY): Payer: Self-pay

## 2022-08-28 ENCOUNTER — Other Ambulatory Visit: Payer: Self-pay | Admitting: Family Medicine

## 2022-08-28 MED ORDER — OXYCODONE-ACETAMINOPHEN 7.5-325 MG PO TABS: 1.0000 | ORAL_TABLET | Freq: Four times a day (QID) | ORAL | 0 refills | Status: DC | PRN

## 2022-08-28 NOTE — Telephone Encounter (Signed)
Spoke with both patient her husband and the pharmacy. Pharmacy had script (Lorazepam) and will get script ready for patient around 4pm

## 2022-08-28 NOTE — Telephone Encounter (Signed)
completed

## 2022-09-01 ENCOUNTER — Telehealth (HOSPITAL_COMMUNITY): Payer: Self-pay

## 2022-09-01 NOTE — Telephone Encounter (Signed)
Medication management - Telephone call with patient, after she had left a message to make sure she was able to fill her Lorazepam as her pharmacy still has refills and patient confirmed she had gotten these.

## 2022-09-07 ENCOUNTER — Ambulatory Visit: Payer: Medicare Other | Admitting: Adult Health

## 2022-09-10 ENCOUNTER — Other Ambulatory Visit: Payer: Self-pay | Admitting: Family Medicine

## 2022-09-11 NOTE — Telephone Encounter (Signed)
PT NEED CPE APPT W/PCP FOR FUTURE REFILLS  

## 2022-09-22 ENCOUNTER — Other Ambulatory Visit: Payer: Self-pay | Admitting: Family Medicine

## 2022-09-22 ENCOUNTER — Telehealth: Payer: Self-pay

## 2022-09-22 ENCOUNTER — Encounter: Payer: Self-pay | Admitting: Family Medicine

## 2022-09-22 MED ORDER — OXYCODONE-ACETAMINOPHEN 7.5-325 MG PO TABS: 1.0000 | ORAL_TABLET | Freq: Four times a day (QID) | ORAL | 0 refills | Status: DC | PRN

## 2022-09-22 NOTE — Telephone Encounter (Signed)
Pt called requesting a refill on her Oxycodone. Last RF was 08/28/2022. Last OV was 07/25/2022. Pt uses [facility]. Thank you.

## 2022-09-22 NOTE — Telephone Encounter (Signed)
CPE scheduled  

## 2022-09-27 ENCOUNTER — Other Ambulatory Visit: Payer: Self-pay | Admitting: Family Medicine

## 2022-09-28 MED ORDER — OXYCODONE-ACETAMINOPHEN 7.5-325 MG PO TABS: 1.0000 | ORAL_TABLET | Freq: Four times a day (QID) | ORAL | 0 refills | Status: DC | PRN

## 2022-09-28 NOTE — Telephone Encounter (Signed)
Spoke w/pharmacist, stated that the 12/29 refill request was never filled because request was too soon (08/28/22 due). However, since it is due, today"ok' pharmacist to fill this request, pcp aware and voiced understanding.   Pls sign encounter, we can't close it.

## 2022-10-05 ENCOUNTER — Other Ambulatory Visit: Payer: Self-pay | Admitting: Family Medicine

## 2022-10-06 ENCOUNTER — Other Ambulatory Visit: Payer: Self-pay | Admitting: Family Medicine

## 2022-10-06 NOTE — Telephone Encounter (Signed)
Requested Prescriptions  Pending Prescriptions Disp Refills   rosuvastatin (CRESTOR) 20 MG tablet [Pharmacy Med Name: ROSUVASTATIN CALCIUM 20 MG TAB] 90 tablet 0    Sig: TAKE 1 TABLET BY MOUTH EVERY DAY     Cardiovascular:  Antilipid - Statins 2 Failed - 10/06/2022 12:30 PM      Failed - Lipid Panel in normal range within the last 12 months    Cholesterol  Date Value Ref Range Status  06/29/2021 140 0 - 200 mg/dL Final   LDL Cholesterol (Calc)  Date Value Ref Range Status  01/10/2021 80 mg/dL (calc) Final    Comment:    Reference range: <100 . Desirable range <100 mg/dL for primary prevention;   <70 mg/dL for patients with CHD or diabetic patients  with > or = 2 CHD risk factors. Marland Kitchen LDL-C is now calculated using the Martin-Hopkins  calculation, which is a validated novel method providing  better accuracy than the Friedewald equation in the  estimation of LDL-C.  Cresenciano Genre et al. Annamaria Helling. 1505;697(94): 2061-2068  (http://education.[facility].com/faq/FAQ164)    LDL Cholesterol  Date Value Ref Range Status  06/29/2021 74 0 - 99 mg/dL Final    Comment:           Total Cholesterol/HDL:CHD Risk Coronary Heart Disease Risk Table                     Men   Women  1/2 Average Risk   3.4   3.3  Average Risk       5.0   4.4  2 X Average Risk   9.6   7.1  3 X Average Risk  23.4   11.0        Use the calculated Patient Ratio above and the CHD Risk Table to determine the patient's CHD Risk.        ATP III CLASSIFICATION (LDL):  <100     mg/dL   Optimal  100-129  mg/dL   Near or Above                    Optimal  130-159  mg/dL   Borderline  160-189  mg/dL   High  >190     mg/dL   Very High Performed at [facility], [address], [place], [place] [address]    HDL  Date Value Ref Range Status  06/29/2021 51 >40 mg/dL Final   Triglycerides  Date Value Ref Range Status  06/29/2021 76 <150 mg/dL Final         Passed - Cr in normal range and within 360 days    Creat   Date Value Ref Range Status  10/06/2021 0.68 0.60 - 1.00 mg/dL Final   Creatinine, Ser  Date Value Ref Range Status  02/01/2022 0.89 0.44 - 1.00 mg/dL Final         Passed - Patient is not pregnant      Passed - Valid encounter within last 12 months    Recent Outpatient Visits           11 months ago Hematuria, unspecified type   [place] Susy Frizzle, MD   12 months ago Hematuria, unspecified type   [place] Susy Frizzle, MD   1 year ago Hematuria, unspecified type   [place] Eulogio Bear, NP   1 year ago Atrial fibrillation with RVR ([place])   [place] Susy Frizzle,  MD   1 year ago Rapid heart beat   [place] Pickard, Cammie Mcgee, MD       Future Appointments             In 3 days Rubie Maid, [place], [place]   In 3 weeks Dennard Schaumann, Cammie Mcgee, MD [place]

## 2022-10-09 ENCOUNTER — Ambulatory Visit: Payer: Medicare Other | Admitting: Family Medicine

## 2022-10-11 ENCOUNTER — Other Ambulatory Visit: Payer: Self-pay | Admitting: Family Medicine

## 2022-10-12 MED ORDER — APIXABAN 5 MG PO TABS: 5.0000 mg | ORAL_TABLET | Freq: Two times a day (BID) | ORAL | 0 refills | Status: DC

## 2022-10-30 ENCOUNTER — Ambulatory Visit (INDEPENDENT_AMBULATORY_CARE_PROVIDER_SITE_OTHER): Payer: Medicare Other | Admitting: Family Medicine

## 2022-10-30 ENCOUNTER — Encounter: Payer: Self-pay | Admitting: Family Medicine

## 2022-10-30 VITALS — BP 126/68 | HR 82 | Temp 98.6°F | Ht 65.0 in | Wt 113.0 lb

## 2022-10-30 DIAGNOSIS — Z1231 Encounter for screening mammogram for malignant neoplasm of breast: Secondary | ICD-10-CM

## 2022-10-30 DIAGNOSIS — I5189 Other ill-defined heart diseases: Secondary | ICD-10-CM | POA: Diagnosis not present

## 2022-10-30 DIAGNOSIS — Z780 Asymptomatic menopausal state: Secondary | ICD-10-CM

## 2022-10-30 DIAGNOSIS — Z23 Encounter for immunization: Secondary | ICD-10-CM

## 2022-10-30 DIAGNOSIS — I10 Essential (primary) hypertension: Secondary | ICD-10-CM

## 2022-10-30 DIAGNOSIS — Z0000 Encounter for general adult medical examination without abnormal findings: Secondary | ICD-10-CM

## 2022-10-30 DIAGNOSIS — M5442 Lumbago with sciatica, left side: Secondary | ICD-10-CM

## 2022-10-30 DIAGNOSIS — Z0001 Encounter for general adult medical examination with abnormal findings: Secondary | ICD-10-CM | POA: Diagnosis not present

## 2022-10-30 DIAGNOSIS — F319 Bipolar disorder, unspecified: Secondary | ICD-10-CM

## 2022-10-30 DIAGNOSIS — Z85048 Personal history of other malignant neoplasm of rectum, rectosigmoid junction, and anus: Secondary | ICD-10-CM | POA: Diagnosis not present

## 2022-10-30 DIAGNOSIS — G8929 Other chronic pain: Secondary | ICD-10-CM

## 2022-10-30 DIAGNOSIS — Z8543 Personal history of malignant neoplasm of ovary: Secondary | ICD-10-CM

## 2022-10-30 DIAGNOSIS — M5441 Lumbago with sciatica, right side: Secondary | ICD-10-CM

## 2022-10-30 DIAGNOSIS — F17200 Nicotine dependence, unspecified, uncomplicated: Secondary | ICD-10-CM

## 2022-10-30 DIAGNOSIS — I4891 Unspecified atrial fibrillation: Secondary | ICD-10-CM

## 2022-10-30 MED ORDER — OXYCODONE-ACETAMINOPHEN 7.5-325 MG PO TABS: 1.0000 | ORAL_TABLET | Freq: Four times a day (QID) | ORAL | 0 refills | Status: DC | PRN

## 2022-10-30 NOTE — Addendum Note (Signed)
Addended by: Randal Buba K on: 10/30/2022 12:01 PM   Modules accepted: Orders

## 2022-10-30 NOTE — Progress Notes (Signed)
Subjective:    Patient ID: Abigail Wagner, female    DOB: 24-Mar-1951, 72 y.o.   MRN: [id number]  HPI  Patient is a 72 year old Caucasian female with a history of ovarian cancer stage III as well as anal cancer.  She is long overdue for mammogram.  We discussed this today and she will allow me to schedule with.  She smoked for more than 20 pack years.  She quit smoking 1 month ago however she is overdue for lung cancer screening with a CAT scan.  She does not require Pap smear as she has had a hysterectomy although she does have a history of ovarian cancer and is due for a CA125.  Her last colonoscopy was in 2022 and GI recommended colonoscopies at that point.  She is overdue for a bone density test.  She is also due for a flu shot, COVID shot, and RSV vaccine.  She is also due to check a TSH regarding her amiodarone.  She is also due for regular fasting lab work.  She reports chronic back pain and left posterior hip pain.  She been taking 1 oxycodone a day along with BC powders and Goody powders and 4 Tylenol.  I explained to the patient that she is at high risk for bleeding taking Eliquis and also taking Goody powders.  She is requesting that I increase the pain pills to 2 a day 1 in the morning and 1 at night.  I would be willing to do this. Past Medical History:  Diagnosis Date   Allergy    Anal carcinoma ([place])    Chemo and radiation   Anemia    Anxiety    Dr. Harrington Challenger at [facility] (psychiatrist).     Blood transfusion without reported diagnosis 2017   Carotid artery disease (HCC)    Chronic kidney disease    DDD (degenerative disc disease), lumbosacral    GERD (gastroesophageal reflux disease)    Headache(784.0)    History of adenomatous polyp of colon 10/07/2015   Tubular adenoma high grade dysplasia   History of herpes genitalis    History of kidney stones    History of suicide attempt 2010   HTN (hypertension)    Hyperlipidemia    IBS (irritable bowel syndrome)    Major depression     OA (osteoarthritis)    Ovarian cancer ([place])    Stage IIIB papillary ovarian carcinoma  s/p omentectomy and BSO & chemotherapy (completed 12-07-2014)   Paroxysmal atrial fibrillation (HCC)    Prolapse of vaginal vault after hysterectomy 07/20/2015   Rectovaginal fistula 08/05/2015   Seasonal allergies    Sigmoid diverticulosis    Smokers' cough [facility])     Past Surgical History:  Procedure Laterality Date   BIOPSY  10/12/2020   Procedure: BIOPSY;  Surgeon: Harvel Quale, MD;  [facility];  Service: Gastroenterology;;   [place]   COLONOSCOPY N/A 10/07/2015   Procedure: COLONOSCOPY;  Surgeon: Rogene Houston, MD;  [facility];  Service: Endoscopy;  Laterality: N/A;  7:30   COLONOSCOPY WITH PROPOFOL N/A 10/12/2020   Procedure: COLONOSCOPY WITH PROPOFOL;  Surgeon: Harvel Quale, MD;  [facility];  Service: Gastroenterology;  Laterality: N/A;  9:00   [place] N/A 02/27/2022   Procedure: CYSTOSCOPY;  Surgeon: Cleon Gustin, MD;  Location: [facility];  Service: Urology;  Laterality: N/A;   CYSTOSCOPY WITH FULGERATION N/A 11/03/2021   Procedure: CYSTOSCOPY WITH FULGERATION;  Surgeon: Cleon Gustin, MD;  Location: [facility];  Service: Urology;  Laterality: N/A;   ESOPHAGOGASTRODUODENOSCOPY (EGD) WITH PROPOFOL N/A 10/12/2020   Procedure: ESOPHAGOGASTRODUODENOSCOPY (EGD) WITH PROPOFOL;  Surgeon: Harvel Quale, MD;  [facility];  Service: Gastroenterology;  Laterality: N/A;   EXPLORATORY LAPAROTOMY/ OMENTECTOMY/  BILATERAL SALPINGOOPHORECTOMY/  PORT-A-CATH PLACEMENT  07-03-2014   [place]   KNEE ARTHROSCOPY Left 09/22/2004   LAPAROSCOPY N/A 06/02/2014   Procedure: DIAGNOSTIC LAPAROSCOPY, OMENTAL BIOPSY, RIGHT OVARY BIOPSY, LYSIS OF ADHESIONS;  Surgeon: Fanny Skates, MD;  Location: [place];  Service: General;   Laterality: N/A;   MUCOSAL ADVANCEMENT FLAP N/A 06/15/2016   Procedure: EXCISION RECTOVAGINAOL FISTULA WITH MUCOSAL ADVANCEMENT FLAP;  Surgeon: Leighton Ruff, MD;  Location: [place];  Service: General;  Laterality: N/A;   [place] N/A 09/09/2015   Procedure: PLACEMENT OF SETON;  Surgeon: Leighton Ruff, MD;  Location: [facility];  Service: General;  Laterality: N/A;   PORT-A-CATH REMOVAL Right 08/17/2014   Procedure: REMOVAL INTRAPERITONEAL CHEMO PORT;  Surgeon: Fanny Skates, MD;  Location: [place];  Service: General;  Laterality: Right;   PORTACATH PLACEMENT Right 08/17/2014   Procedure:  PLACE NEW PORT A CATH;  Surgeon: Fanny Skates, MD;  Location: [place];  Service: General;  Laterality: Right;   RECTAL BIOPSY N/A 09/09/2015   Procedure: BIOPSY OF RECTOVAGINAL MASS;  Surgeon: Leighton Ruff, MD;  Location: [place];  Service: General;  Laterality: N/A;   TRANSURETHRAL RESECTION OF BLADDER TUMOR N/A 02/27/2022   Procedure: TRANSURETHRAL RESECTION OF BLADDER TUMOR (TURBT);  Surgeon: Cleon Gustin, MD;  [facility];  Service: Urology;  Laterality: N/A;   TUBAL LIGATION  YRS AGO   VAGINAL HYSTERECTOMY  1981   fibroids   Current Outpatient Medications on File Prior to Visit  Medication Sig Dispense Refill   albuterol (VENTOLIN HFA) 108 (90 Base) MCG/ACT inhaler TAKE 2 PUFFS BY MOUTH EVERY 6 HOURS AS NEEDED FOR WHEEZE OR SHORTNESS OF BREATH 8.5 each 0   amiodarone (PACERONE) 200 MG tablet Take 1 tablet (200 mg total) by mouth daily. 90 tablet 3   apixaban (ELIQUIS) 5 MG TABS tablet Take 1 tablet (5 mg total) by mouth 2 (two) times daily. 180 tablet 0   Aspirin-Salicylamide-Caffeine (BC HEADACHE POWDER PO) Take 1-2 Packages by mouth daily at 12 noon.     b complex vitamins tablet Take 1 tablet by mouth daily.     Cholecalciferol (VITAMIN D-3) 125 MCG (5000 UT) TABS Take 5,000 Units by  mouth daily.     clotrimazole (LOTRIMIN) 1 % cream Apply 1 application topically 2 (two) times daily as needed (itching around ankles).     Coenzyme Q10 (CO Q 10) 100 MG CAPS Take 100 mg by mouth daily.     divalproex (DEPAKOTE ER) 500 MG 24 hr tablet Take 1 tablet (500 mg total) by mouth daily. 90 tablet 3   escitalopram (LEXAPRO) 20 MG tablet Take 1 tablet (20 mg total) by mouth daily. 30 tablet 3   estradiol (ESTRACE) 0.1 MG/GM vaginal cream Apply a pea size amount of cream to urethral area of vagina twice weekly 42.5 g 12   Ferrous Gluconate (IRON 27 PO) Take 27 mg by mouth daily.     Homeopathic Products (THERAWORX RELIEF EX) Apply 1 application. topically daily as needed (joint pain).  Krill Oil 350 MG CAPS Take 350 mg by mouth daily.     LORazepam (ATIVAN) 0.5 MG tablet Take 1 tablet (0.5 mg total) by mouth 3 (three) times daily as needed for anxiety. 90 tablet 3   Multiple Vitamin (MULTIVITAMIN) tablet Take 1 tablet by mouth daily.     Multiple Vitamins-Minerals (HAIR SKIN & NAILS ADVANCED PO) Take 3 tablets by mouth daily.     omeprazole (PRILOSEC) 40 MG capsule TAKE 1 CAPSULE (40 MG TOTAL) BY MOUTH DAILY. 90 capsule 3   ondansetron (ZOFRAN) 4 MG tablet Take 1 tablet (4 mg total) by mouth daily as needed for nausea or vomiting. 30 tablet 1   oxyCODONE-acetaminophen (PERCOCET) 7.5-325 MG tablet Take 1 tablet by mouth every 6 (six) hours as needed for severe pain. This script should last 30 days. 30 tablet 0   polyethylene glycol (MIRALAX / GLYCOLAX) 17 g packet Take 17 g by mouth daily. 30 each 1   rosuvastatin (CRESTOR) 20 MG tablet TAKE 1 TABLET BY MOUTH EVERY DAY 90 tablet 0   senna-docusate (SENOKOT S) 8.6-50 MG tablet Take 1 tablet by mouth daily. 20 tablet 0   tizanidine (ZANAFLEX) 2 MG capsule Take 1 capsule (2 mg total) by mouth 3 (three) times daily as needed for muscle spasms. Do not drink alcohol or drive while taking this medication, may cause drowsiness 15 capsule 0   No  current facility-administered medications on file prior to visit.   Allergies  Allergen Reactions   Morphine And Related     Delirium with sbo   Social History   Socioeconomic History   Marital status: Married    Spouse name: Coralyn Mark   Number of children: 4   Years of education: 12   Highest education level: Not on file  Occupational History   Occupation: retire    Comment: [employer]  Tobacco Use   Smoking status: Former    Packs/day: 0.50    Years: 15.00    Total pack years: 7.50    Types: Cigarettes    Quit date: 02/16/2018    Years since quitting: 4.7   Smokeless tobacco: Never   Tobacco comments:    half pack a day for 15 yrs  Vaping Use   Vaping Use: Never used  Substance and Sexual Activity   Alcohol use: No   Drug use: No   Sexual activity: Not Currently    Birth control/protection: Surgical    Comment: hyst  Other Topics Concern   Not on file  Social History Narrative   Lives at home with Coralyn Mark   Retired [employer]   Married 02/16/1990   Several grandchildren    Social Determinants of Health   Financial Resource Strain: [place]  (01/27/2022)   Overall Financial Resource Strain (CARDIA)    Difficulty of Paying Living Expenses: Not hard at all  Food Insecurity: No Food Insecurity (01/27/2022)   Hunger Vital Sign    Worried About Running Out of Food in the Last Year: Never true    [place] in the Last Year: Never true  Transportation Needs: No Transportation Needs (01/27/2022)   PRAPARE - Hydrologist (Medical): No    Lack of Transportation (Non-Medical): No  Physical Activity: Insufficiently Active (01/27/2022)   Exercise Vital Sign    Days of Exercise per Week: 3 days    Minutes of Exercise per Session: 20 min  Stress: No Stress Concern Present (01/27/2022)   [place]  [employer] [place] of Stress : Not at all  Social Connections: Socially Integrated (01/27/2022)    Social Connection and Isolation Panel [NHANES]    Frequency of Communication with Friends and Family: More than three times a week    Frequency of Social Gatherings with Friends and Family: More than three times a week    Attends Religious Services: More than 4 times per year    Active Member of [employer] or Organizations: Yes    Attends Music therapist: More than 4 times per year    Marital Status: Married  Human resources officer Violence: Not At Risk (01/27/2022)   Humiliation, Afraid, Rape, and Kick questionnaire    Fear of Current or Ex-Partner: No    Emotionally Abused: No    Physically Abused: No    Sexually Abused: No   Family History  Problem Relation Age of Onset   Bipolar disorder Mother    Anxiety disorder Mother    Dementia Mother    Depression Mother    Mental illness Mother    Vision loss Mother    Alzheimer's disease Mother    Alcohol abuse Paternal Uncle    Bipolar disorder Maternal Grandmother    Dementia Maternal Grandmother    Alzheimer's disease Maternal Grandmother    Alcohol abuse Paternal Uncle    Stroke Brother    Deep vein thrombosis Son    Heart disease Father    Hyperlipidemia Father    Hypertension Father    Stroke Father    Vision loss Father    Atrial fibrillation Sister    Heart disease Brother    Heart disease Brother    ADD / ADHD Neg Hx    Drug abuse Neg Hx    OCD Neg Hx    Paranoid behavior Neg Hx    Schizophrenia Neg Hx    Seizures Neg Hx    Sexual abuse Neg Hx    Physical abuse Neg Hx       Review of Systems     Objective:   Physical Exam Vitals reviewed.  Constitutional:      General: She is not in acute distress.    Appearance: She is not ill-appearing or diaphoretic.  Cardiovascular:     Rate and Rhythm: Normal rate and regular rhythm.     Heart sounds: Normal heart sounds. No murmur heard.    No friction rub. No gallop.  Pulmonary:     Effort: Pulmonary effort is normal. No respiratory distress.     Breath  sounds: Normal breath sounds. No stridor. No wheezing or rales.  Abdominal:     General: Abdomen is flat. Bowel sounds are normal. There is no distension.     Palpations: Abdomen is soft. There is no mass.     Tenderness: There is no abdominal tenderness.  Musculoskeletal:     Lumbar back: Tenderness and bony tenderness present. Decreased range of motion.     Right upper leg: No deformity.     Left upper leg: No deformity.     Right lower leg: No edema.     Left lower leg: No edema.  Neurological:     Mental Status: She is alert.         Assessment & Plan:  History of anal cancer  Grade II diastolic dysfunction  Benign essential HTN - Plan: CBC with Differential/Platelet, Lipid panel, COMPLETE METABOLIC PANEL WITH GFR  Encounter for Medicare annual wellness  exam  Chronic midline low back pain with bilateral sciatica  Bipolar 1 disorder ([place])  History of ovarian cancer - Plan: CA 125  Encounter for screening mammogram for malignant neoplasm of breast - Plan: MM Digital Screening  Postmenopausal estrogen deficiency - Plan: DG Bone Density  Smoker - Plan: CT CHEST LUNG CA SCREEN LOW DOSE W/O CM  Atrial fibrillation, unspecified type ([place]) - Plan: TSH Patient is due for a mammogram to screen for breast cancer.  I will schedule this.  She is overdue for a bone density to screen for osteoporosis.  I will schedule this.  Her colonoscopy was done in 2022.  GI recommended no further colonoscopy at that time.  She is due to check a CA125 to monitor for recurrence of her ovarian cancer.  I will schedule the patient for a CAT scan to screen for lung cancer.  Check a CBC a CMP and a lipid panel as well as a TSH.  Today she is in normal sinus rhythm but she is appropriately anticoagulated with Eliquis.  Patient received a flu shot today.  Recommended RSV vaccine as well as a COVID-vaccine.  She reported left posterior hip pain which I believe is due to sciatica.  She has seen orthopedics  and has been told there is nothing further they can do having tried and failed steroid injections.  I will increase oxycodone to 2 a day/60/month.  Discontinued BC and Goody powders

## 2022-10-31 ENCOUNTER — Other Ambulatory Visit: Payer: Self-pay

## 2022-10-31 DIAGNOSIS — R319 Hematuria, unspecified: Secondary | ICD-10-CM

## 2022-10-31 NOTE — Addendum Note (Signed)
Addended by: Colman Cater on: 10/31/2022 10:11 AM   Modules accepted: Orders

## 2022-10-31 NOTE — Addendum Note (Signed)
Addended by: Colman Cater on: 10/31/2022 10:22 AM   Modules accepted: Orders

## 2022-11-01 LAB — LIPID PANEL
Cholesterol: 132 mg/dL (ref <200)
HDL: 65 mg/dL (ref >=50)
LDL Cholesterol (Calc): 49 mg/dL (calc)
Non-HDL Cholesterol (Calc): 67 mg/dL (calc) (ref <130)
Total CHOL/HDL Ratio: 2 (calc) (ref <5.0)
Triglycerides: 97 mg/dL (ref <150)

## 2022-11-01 LAB — IRON,TIBC AND FERRITIN PANEL
%SAT: 8 % (calc) — ABNORMAL LOW (ref 16–45)
Ferritin: 55 ng/mL (ref 16–288)
Iron: 28 ug/dL — ABNORMAL LOW (ref 45–160)
TIBC: 330 mcg/dL (calc) (ref 250–450)

## 2022-11-01 LAB — COMPLETE METABOLIC PANEL WITH GFR
AG Ratio: 1.1 (calc) (ref 1.0–2.5)
ALT: 13 U/L (ref 6–29)
AST: 24 U/L (ref 10–35)
Albumin: 3.5 g/dL — ABNORMAL LOW (ref 3.6–5.1)
Alkaline phosphatase (APISO): 115 U/L (ref 37–153)
BUN: 22 mg/dL (ref 7–25)
CO2: 28 mmol/L (ref 20–32)
Calcium: 9.1 mg/dL (ref 8.6–10.4)
Chloride: 102 mmol/L (ref 98–110)
Creat: 0.84 mg/dL (ref 0.60–1.00)
Globulin: 3.3 g/dL (calc) (ref 1.9–3.7)
Glucose, Bld: 79 mg/dL (ref 65–99)
Potassium: 4.5 mmol/L (ref 3.5–5.3)
Sodium: 139 mmol/L (ref 135–146)
Total Bilirubin: 0.2 mg/dL (ref 0.2–1.2)
Total Protein: 6.8 g/dL (ref 6.1–8.1)
eGFR: 74 mL/min/{1.73_m2} (ref >=60)

## 2022-11-01 LAB — CBC WITH DIFFERENTIAL/PLATELET
Absolute Monocytes: 418 cells/uL (ref 200–950)
Basophils Absolute: 58 cells/uL (ref 0–200)
Basophils Relative: 1 %
Eosinophils Absolute: 122 cells/uL (ref 15–500)
Eosinophils Relative: 2.1 %
HCT: 29.1 % — ABNORMAL LOW (ref 35.0–45.0)
Hemoglobin: 9.6 g/dL — ABNORMAL LOW (ref 11.7–15.5)
Lymphs Abs: 1067 cells/uL (ref 850–3900)
MCH: 30.8 pg (ref 27.0–33.0)
MCHC: 33 g/dL (ref 32.0–36.0)
MCV: 93.3 fL (ref 80.0–100.0)
MPV: 9.9 fL (ref 7.5–12.5)
Monocytes Relative: 7.2 %
Neutro Abs: 4135 cells/uL (ref 1500–7800)
Neutrophils Relative %: 71.3 %
Platelets: 409 10*3/uL — ABNORMAL HIGH (ref 140–400)
RBC: 3.12 10*6/uL — ABNORMAL LOW (ref 3.80–5.10)
RDW: 14.4 % (ref 11.0–15.0)
Total Lymphocyte: 18.4 %
WBC: 5.8 10*3/uL (ref 3.8–10.8)

## 2022-11-01 LAB — TSH: TSH: 1.01 mIU/L (ref 0.40–4.50)

## 2022-11-01 LAB — TEST AUTHORIZATION

## 2022-11-01 LAB — B12 AND FOLATE PANEL
Folate: >24 ng/mL
Vitamin B-12: 847 pg/mL (ref 200–1100)

## 2022-11-01 LAB — CA 125: CA 125: 45 U/mL — ABNORMAL HIGH (ref <35)

## 2022-11-03 ENCOUNTER — Other Ambulatory Visit: Payer: Self-pay

## 2022-11-03 DIAGNOSIS — Z85048 Personal history of other malignant neoplasm of rectum, rectosigmoid junction, and anus: Secondary | ICD-10-CM

## 2022-11-03 DIAGNOSIS — R971 Elevated cancer antigen 125 [CA 125]: Secondary | ICD-10-CM

## 2022-11-03 DIAGNOSIS — I5189 Other ill-defined heart diseases: Secondary | ICD-10-CM

## 2022-11-03 DIAGNOSIS — Z8543 Personal history of malignant neoplasm of ovary: Secondary | ICD-10-CM

## 2022-11-13 ENCOUNTER — Ambulatory Visit (HOSPITAL_COMMUNITY)
Admission: RE | Admit: 2022-11-13 | Discharge: 2022-11-13 | Disposition: A | Discharge status: Home / Self Care | Payer: Medicare Other | Source: Ambulatory Visit | Attending: Family Medicine | Admitting: Family Medicine

## 2022-11-13 DIAGNOSIS — Z1231 Encounter for screening mammogram for malignant neoplasm of breast: Secondary | ICD-10-CM | POA: Diagnosis not present

## 2022-11-13 DIAGNOSIS — Z780 Asymptomatic menopausal state: Secondary | ICD-10-CM | POA: Diagnosis not present

## 2022-11-17 ENCOUNTER — Encounter (HOSPITAL_COMMUNITY): Payer: Self-pay | Admitting: Psychiatry

## 2022-11-17 ENCOUNTER — Telehealth (INDEPENDENT_AMBULATORY_CARE_PROVIDER_SITE_OTHER): Payer: Medicare Other | Admitting: Psychiatry

## 2022-11-17 DIAGNOSIS — F319 Bipolar disorder, unspecified: Secondary | ICD-10-CM

## 2022-11-17 DIAGNOSIS — F331 Major depressive disorder, recurrent, moderate: Secondary | ICD-10-CM

## 2022-11-17 MED ORDER — LORAZEPAM 0.5 MG PO TABS: 0.5000 mg | ORAL_TABLET | Freq: Three times a day (TID) | ORAL | 3 refills | Status: DC | PRN

## 2022-11-17 MED ORDER — ESCITALOPRAM OXALATE 20 MG PO TABS: 20.0000 mg | ORAL_TABLET | Freq: Every day | ORAL | 3 refills | Status: DC

## 2022-11-17 MED ORDER — DIVALPROEX SODIUM ER 500 MG PO TB24: 500.0000 mg | ORAL_TABLET | Freq: Every day | ORAL | 3 refills | Status: DC

## 2022-11-17 NOTE — Progress Notes (Signed)
Virtual Visit via Telephone Note  I connected with Abigail Wagner on 11/17/22 at 11:20 AM EST by telephone and verified that I am speaking with the correct person using two identifiers.  Location: Patient: home Provider: office   I discussed the limitations, risks, security and privacy concerns of performing an evaluation and management service by telephone and the availability of in person appointments. I also discussed with the patient that there may be a patient responsible charge related to this service. The patient expressed understanding and agreed to proceed.     I discussed the assessment and treatment plan with the patient. The patient was provided an opportunity to ask questions and all were answered. The patient agreed with the plan and demonstrated an understanding of the instructions.   The patient was advised to call back or seek an in-person evaluation if the symptoms worsen or if the condition fails to improve as anticipated.  I provided 15 minutes of non-face-to-face time during this encounter.   [name], MD  [facility] MD/PA/NP OP Progress Note  11/17/2022 11:47 AM Abigail Wagner  MRN:  [id number]  Chief Complaint:  Chief Complaint  Patient presents with   Anxiety   Depression   Follow-up   HPI: This patient is a 72 year old married white female lives with her husband in [place]. She has 3 children and 5 grandchildren. She is retired from [employer].   The patient states she's had depression since her 69s. She was hospitalized several times, last time being in 2010 when she took a drug overdose. She was going through a lot of family stress back then. She states that she was hospitalized [facility] and since then she has never wanted to go back. She's been quite stable despite her low doses of medication. She's also stopped drinking alcohol which is made a big difference  The patient returns for follow-up after about 4 months.  She is still having  chronic health issues.  Her hemoglobin and hematocrit are low and her iron is low.  She states that she cannot tolerate iron because it causes constipation.  She is having diarrhea right now however may be due to a stomach bug.  Her weight is down to 113 pounds.  She also had an elevation of the CA125 and is going to have a CT of her pelvic area as well as a CT scan for cigarette smoking to look for lung cancer.  Despite all this the patient's mood has been stable.  She denies significant depression or anxiety.  She denies mood swings manic symptoms such as racing thoughts agitation or hyperactivity.  She is sleeping well. Visit Diagnosis:    ICD-10-CM   1. Bipolar 1 disorder (HCC)  F31.9     2. Major depressive disorder, recurrent episode, moderate ([place])  F33.1       Past Psychiatric History: Past hospitalizations for depression and alcohol abuse, none since 2010  Past Medical History:  Past Medical History:  Diagnosis Date   Allergy    Anal carcinoma ([place])    Chemo and radiation   Anemia    Anxiety    Dr. Harrington Challenger at [facility] (psychiatrist).     Blood transfusion without reported diagnosis 2017   Carotid artery disease ([place])    Chronic kidney disease    DDD (degenerative disc disease), lumbosacral    GERD (gastroesophageal reflux disease)    Headache(784.0)    History of adenomatous polyp of colon 10/07/2015   Tubular adenoma  high grade dysplasia   History of herpes genitalis    History of kidney stones    History of suicide attempt 2010   HTN (hypertension)    Hyperlipidemia    IBS (irritable bowel syndrome)    Major depression    OA (osteoarthritis)    Ovarian cancer ([place])    Stage IIIB papillary ovarian carcinoma  s/p omentectomy and BSO & chemotherapy (completed 12-07-2014)   Paroxysmal atrial fibrillation (HCC)    Prolapse of vaginal vault after hysterectomy 07/20/2015   Rectovaginal fistula 08/05/2015   Seasonal allergies    Sigmoid diverticulosis    Smokers' cough  [facility])     Past Surgical History:  Procedure Laterality Date   BIOPSY  10/12/2020   Procedure: BIOPSY;  Surgeon: Harvel Quale, MD;  [facility];  Service: Gastroenterology;;   [place]   COLONOSCOPY N/A 10/07/2015   Procedure: COLONOSCOPY;  Surgeon: Rogene Houston, MD;  [facility];  Service: Endoscopy;  Laterality: N/A;  7:30   COLONOSCOPY WITH PROPOFOL N/A 10/12/2020   Procedure: COLONOSCOPY WITH PROPOFOL;  Surgeon: Harvel Quale, MD;  [facility];  Service: Gastroenterology;  Laterality: N/A;  9:00   [place] N/A 02/27/2022   Procedure: CYSTOSCOPY;  Surgeon: Cleon Gustin, MD;  Location: [facility];  Service: Urology;  Laterality: N/A;   CYSTOSCOPY WITH FULGERATION N/A 11/03/2021   Procedure: CYSTOSCOPY WITH FULGERATION;  Surgeon: Cleon Gustin, MD;  Location: [facility];  Service: Urology;  Laterality: N/A;   ESOPHAGOGASTRODUODENOSCOPY (EGD) WITH PROPOFOL N/A 10/12/2020   Procedure: ESOPHAGOGASTRODUODENOSCOPY (EGD) WITH PROPOFOL;  Surgeon: Harvel Quale, MD;  [facility];  Service: Gastroenterology;  Laterality: N/A;   EXPLORATORY LAPAROTOMY/ OMENTECTOMY/  BILATERAL SALPINGOOPHORECTOMY/  PORT-A-CATH PLACEMENT  07-03-2014   [place]   KNEE ARTHROSCOPY Left 09/22/2004   LAPAROSCOPY N/A 06/02/2014   Procedure: DIAGNOSTIC LAPAROSCOPY, OMENTAL BIOPSY, RIGHT OVARY BIOPSY, LYSIS OF ADHESIONS;  Surgeon: Fanny Skates, MD;  Location: [place];  Service: General;  Laterality: N/A;   MUCOSAL ADVANCEMENT FLAP N/A 06/15/2016   Procedure: EXCISION RECTOVAGINAOL FISTULA WITH MUCOSAL ADVANCEMENT FLAP;  Surgeon: Leighton Ruff, MD;  Location: [place];  Service: General;  Laterality: N/A;   [place] N/A 09/09/2015   Procedure: PLACEMENT OF SETON;  Surgeon: Leighton Ruff, MD;  [facility];  Service: General;  Laterality: N/A;   PORT-A-CATH REMOVAL Right 08/17/2014   Procedure: REMOVAL INTRAPERITONEAL CHEMO PORT;  Surgeon: Fanny Skates, MD;  Location: [place];  Service: General;  Laterality: Right;   PORTACATH PLACEMENT Right 08/17/2014   Procedure:  PLACE NEW PORT A CATH;  Surgeon: Fanny Skates, MD;  Location: [place];  Service: General;  Laterality: Right;   RECTAL BIOPSY N/A 09/09/2015   Procedure: BIOPSY OF RECTOVAGINAL MASS;  Surgeon: Leighton Ruff, MD;  Location: [place];  Service: General;  Laterality: N/A;   TRANSURETHRAL RESECTION OF BLADDER TUMOR N/A 02/27/2022   Procedure: TRANSURETHRAL RESECTION OF BLADDER TUMOR (TURBT);  Surgeon: Cleon Gustin, MD;  Location: [facility];  Service: Urology;  Laterality: N/A;   TUBAL LIGATION  YRS AGO   VAGINAL HYSTERECTOMY  1981   fibroids    Family Psychiatric History: see below  Family History:  Family History  Problem Relation Age of Onset   Bipolar  disorder Mother    Anxiety disorder Mother    Dementia Mother    Depression Mother    Mental illness Mother    Vision loss Mother    Alzheimer's disease Mother    Alcohol abuse Paternal Uncle    Bipolar disorder Maternal Grandmother    Dementia Maternal Grandmother    Alzheimer's disease Maternal Grandmother    Alcohol abuse Paternal Uncle    Stroke Brother    Deep vein thrombosis Son    Heart disease Father    Hyperlipidemia Father    Hypertension Father    Stroke Father    Vision loss Father    Atrial fibrillation Sister    Heart disease Brother    Heart disease Brother    ADD / ADHD Neg Hx    Drug abuse Neg Hx    OCD Neg Hx    Paranoid behavior Neg Hx    Schizophrenia Neg Hx    Seizures Neg Hx    Sexual abuse Neg Hx    Physical abuse Neg Hx     Social History:  Social History   Socioeconomic History   Marital status: Married    Spouse name: Coralyn Mark   Number of children: 4    Years of education: 12   Highest education level: Not on file  Occupational History   Occupation: retire    Comment: [employer]  Tobacco Use   Smoking status: Former    Packs/day: 0.50    Years: 15.00    Total pack years: 7.50    Types: Cigarettes    Quit date: 02/16/2018    Years since quitting: 4.7   Smokeless tobacco: Never   Tobacco comments:    half pack a day for 15 yrs  Vaping Use   Vaping Use: Never used  Substance and Sexual Activity   Alcohol use: No   Drug use: No   Sexual activity: Not Currently    Birth control/protection: Surgical    Comment: hyst  Other Topics Concern   Not on file  Social History Narrative   Lives at home with Coralyn Mark   Retired [employer]   Married 02/16/1990   Several grandchildren    Social Determinants of Health   Financial Resource Strain: Low Risk  (01/27/2022)   Overall Financial Resource Strain (CARDIA)    Difficulty of Paying Living Expenses: Not hard at all  Food Insecurity: No Food Insecurity (01/27/2022)   Hunger Vital Sign    Worried About Running Out of Food in the Last Year: Never true    Ran Out of Food in the Last Year: Never true  Transportation Needs: No Transportation Needs (01/27/2022)   PRAPARE - Hydrologist (Medical): No    Lack of Transportation (Non-Medical): No  Physical Activity: Insufficiently Active (01/27/2022)   Exercise Vital Sign    Days of Exercise per Week: 3 days    Minutes of Exercise per Session: 20 min  Stress: No Stress Concern Present (01/27/2022)   [place]    Feeling of Stress : Not at all  Social Connections: [place] (01/27/2022)   Social Connection and Isolation Panel [NHANES]    Frequency of Communication with Friends and Family: More than three times a week    Frequency of Social Gatherings with Friends and Family: More than three times a week    Attends Religious Services: More than 4  times per year  Active Member of Clubs or Organizations: Yes    Attends Archivist Meetings: More than 4 times per year    Marital Status: Married    Allergies:  Allergies  Allergen Reactions   Morphine And Related     Delirium with sbo    Metabolic Disorder Labs: No results found for: "HGBA1C", "MPG" No results found for: "PROLACTIN" Lab Results  Component Value Date   CHOL 132 10/30/2022   TRIG 97 10/30/2022   HDL 65 10/30/2022   CHOLHDL 2.0 10/30/2022   VLDL 15 06/29/2021   LDLCALC 49 10/30/2022   LDLCALC 74 06/29/2021   Lab Results  Component Value Date   TSH 1.01 10/30/2022   TSH 1.470 06/21/2022    Therapeutic Level Labs: No results found for: "LITHIUM" No results found for: "VALPROATE" No results found for: "CBMZ"  Current Medications: Current Outpatient Medications  Medication Sig Dispense Refill   albuterol (VENTOLIN HFA) 108 (90 Base) MCG/ACT inhaler TAKE 2 PUFFS BY MOUTH EVERY 6 HOURS AS NEEDED FOR WHEEZE OR SHORTNESS OF BREATH 8.5 each 0   amiodarone (PACERONE) 200 MG tablet Take 1 tablet (200 mg total) by mouth daily. 90 tablet 3   apixaban (ELIQUIS) 5 MG TABS tablet Take 1 tablet (5 mg total) by mouth 2 (two) times daily. 180 tablet 0   Aspirin-Salicylamide-Caffeine (BC HEADACHE POWDER PO) Take 1-2 Packages by mouth daily at 12 noon.     b complex vitamins tablet Take 1 tablet by mouth daily.     Cholecalciferol (VITAMIN D-3) 125 MCG (5000 UT) TABS Take 5,000 Units by mouth daily.     clotrimazole (LOTRIMIN) 1 % cream Apply 1 application topically 2 (two) times daily as needed (itching around ankles).     Coenzyme Q10 (CO Q 10) 100 MG CAPS Take 100 mg by mouth daily.     divalproex (DEPAKOTE ER) 500 MG 24 hr tablet Take 1 tablet (500 mg total) by mouth daily. 90 tablet 3   escitalopram (LEXAPRO) 20 MG tablet Take 1 tablet (20 mg total) by mouth daily. 30 tablet 3   estradiol (ESTRACE) 0.1 MG/GM vaginal cream Apply a pea size amount of cream  to urethral area of vagina twice weekly 42.5 g 12   Ferrous Gluconate (IRON 27 PO) Take 27 mg by mouth daily.     Homeopathic Products (THERAWORX RELIEF EX) Apply 1 application. topically daily as needed (joint pain).     Krill Oil 350 MG CAPS Take 350 mg by mouth daily.     LORazepam (ATIVAN) 0.5 MG tablet Take 1 tablet (0.5 mg total) by mouth 3 (three) times daily as needed for anxiety. 90 tablet 3   Multiple Vitamin (MULTIVITAMIN) tablet Take 1 tablet by mouth daily.     Multiple Vitamins-Minerals (HAIR SKIN & NAILS ADVANCED PO) Take 3 tablets by mouth daily.     omeprazole (PRILOSEC) 40 MG capsule TAKE 1 CAPSULE (40 MG TOTAL) BY MOUTH DAILY. 90 capsule 3   oxyCODONE-acetaminophen (PERCOCET) 7.5-325 MG tablet Take 1 tablet by mouth every 6 (six) hours as needed for severe pain. This script should last 30 days.  60 per month 60 tablet 0   polyethylene glycol (MIRALAX / GLYCOLAX) 17 g packet Take 17 g by mouth daily. 30 each 1   rosuvastatin (CRESTOR) 20 MG tablet TAKE 1 TABLET BY MOUTH EVERY DAY 90 tablet 0   senna-docusate (SENOKOT S) 8.6-50 MG tablet Take 1 tablet by mouth daily. 20 tablet 0   tizanidine (ZANAFLEX)  2 MG capsule Take 1 capsule (2 mg total) by mouth 3 (three) times daily as needed for muscle spasms. Do not drink alcohol or drive while taking this medication, may cause drowsiness 15 capsule 0   No current facility-administered medications for this visit.     Musculoskeletal: Strength & Muscle Tone: na Gait & Station: na Patient leans: N/A  Psychiatric Specialty Exam: Review of Systems  Constitutional:  Positive for fatigue.  Gastrointestinal:  Positive for diarrhea.  All other systems reviewed and are negative.   There were no vitals taken for this visit.There is no height or weight on file to calculate BMI.  General Appearance: NA  Eye Contact:  NA  Speech:  Clear and Coherent  Volume:  Increased  Mood:  Euthymic  Affect:  NA  Thought Process:  Goal Directed   Orientation:  Full (Time, Place, and Person)  Thought Content: Rumination   Suicidal Thoughts:  No  Homicidal Thoughts:  No  Memory:  good  Judgement:  Good  Insight:  Fair  Psychomotor Activity:  Decreased  Concentration:  Concentration: Fair and Attention Span: Fair  Recall:  [employer] of Knowledge: Fair  Language: Good  Akathisia:  No  Handed:  Right  AIMS (if indicated): not done  Assets:  Communication Skills Desire for Improvement Resilience Social Support  ADL's:  Intact  Cognition: WNL  Sleep:  Good   Screenings: GAD-7    Flowsheet Row Clinical Support from 01/10/2021 in [place]  Total GAD-7 Score 8      PHQ2-9    [place] Office Visit from 10/30/2022 in [place] Office Visit from 07/25/2022 in [place] Office Visit from 07/04/2022 in [place] from 01/27/2022 in [place] Video Visit from 12/21/2021 in [place] at [facility] Total Score 0 0 4 0 0  PHQ-9 Total Score -- 0 7 -- --      Flowsheet Row ED from 07/12/2022 in [facility] Admission (Discharged) from 02/27/2022 in [place] Video Visit from 12/21/2021 in [place] [facility] [place] No Risk No Risk No Risk        Assessment and Plan: This patient is a 72 year old female with a history of depression anxiety possible bipolar disorder, iron just deficiency anemia atrial fibrillation chronic pain and cancer.  She seems to be doing well on her current regimen so she will continue Lexapro 20 mg daily for depression, Ativan 0.5 mg 3 times daily for anxiety and Depakote ER 500 mg at bedtime for mood stabilization.  She will return to see me in 4 months  Collaboration of Care: Collaboration of Care: Primary Care Provider AEB notes  are shared with PCP on the epic system  Patient/Guardian was advised Release of Information must be obtained prior to any record release in order to collaborate their care with an outside provider. Patient/Guardian was advised if they have not already done so to contact the registration department to sign all necessary forms in order for [place] to release information regarding their care.   Consent: Patient/Guardian gives verbal consent for treatment and assignment of benefits for services provided during this visit. Patient/Guardian expressed understanding and agreed to proceed.    [name], MD 11/17/2022, 11:47 AM

## 2022-11-23 ENCOUNTER — Other Ambulatory Visit: Payer: Self-pay | Admitting: Family Medicine

## 2022-11-24 ENCOUNTER — Other Ambulatory Visit: Payer: Self-pay | Admitting: Family Medicine

## 2022-11-24 MED ORDER — OXYCODONE-ACETAMINOPHEN 7.5-325 MG PO TABS: 1.0000 | ORAL_TABLET | Freq: Four times a day (QID) | ORAL | 0 refills | Status: DC | PRN

## 2022-12-05 ENCOUNTER — Ambulatory Visit: Payer: Medicare Other | Admitting: Adult Health

## 2022-12-05 ENCOUNTER — Encounter: Payer: Self-pay | Admitting: Adult Health

## 2022-12-05 VITALS — BP 128/64 | HR 76 | Ht 65.0 in | Wt 117.0 lb

## 2022-12-05 DIAGNOSIS — N9089 Other specified noninflammatory disorders of vulva and perineum: Secondary | ICD-10-CM | POA: Diagnosis not present

## 2022-12-05 DIAGNOSIS — Z923 Personal history of irradiation: Secondary | ICD-10-CM | POA: Diagnosis not present

## 2022-12-05 NOTE — Progress Notes (Signed)
  Subjective:     Patient ID: Abigail Wagner, female   DOB: 08/10/1951, 72 y.o.   MRN: [id number]  HPI Abigail Wagner is a 72 year old white female,married sp hysterectomy and BSO in complaining of vulva irritation for 3-4 months, has tried neosporin, estrace vaginal cream and even Listerine. She is sp pelvic radiation for rectal cancer. PCP is Dr Dennard Schaumann.  Review of Systems +vulva irritation Reviewed past medical,surgical, social and family history. Reviewed medications and allergies.     Objective:   Physical Exam BP 128/64 (BP Location: Left Arm, Patient Position: Sitting, Cuff Size: Normal)   Pulse 76   Ht 5\' 5"  (1.651 m)   Wt 117 lb (53.1 kg)   BMI 19.47 kg/m     Skin warm and dry.Pelvic: external genitalia has mildly red and swollen labia, vagina is pale, and tissue is thin, no lesion seen, +radiation changes, esp at introitus,urethra pale, cervix and uterus are absent,adnexa: no masses or tenderness noted. Bladder is non tender and no masses felt. Has comedone about 2 cm right buttock cheek. Co exam with Dr Elonda Husky Examination chaperoned by Levy Pupa LPN  Fall risk is hight  [place] - 12/05/22 1515       Pregnancy Intention Screening   Does the patient want to become pregnant in the next year? N/A    Does the patient's partner want to become pregnant in the next year? N/A    Would the patient like to discuss contraceptive options today? N/A      Contraception Wrap Up   Current Method Female Sterilization   hyst   End Method Female Sterilization   hyst   Contraception Counseling Provided No             Assessment:       1. Vulvar irritation Vulva is red and mildly swollen No more Listerine Will rx Fannie Cream at [facility], called in [employer] 90 gm use 2-3 x daily prn with 1 refill, she is aware not covered by insurance since it is compunded  Can use frozen peas if needed prn   2. History of radiation therapy     Plan:     Follow up in 4 week for recheck

## 2022-12-06 ENCOUNTER — Ambulatory Visit: Payer: Medicare Other | Admitting: Adult Health

## 2022-12-14 ENCOUNTER — Ambulatory Visit (HOSPITAL_COMMUNITY)
Admission: RE | Admit: 2022-12-14 | Discharge: 2022-12-14 | Disposition: A | Discharge status: Home / Self Care | Payer: Medicare Other | Source: Ambulatory Visit | Attending: Family Medicine | Admitting: Family Medicine

## 2022-12-14 DIAGNOSIS — R971 Elevated cancer antigen 125 [CA 125]: Secondary | ICD-10-CM

## 2022-12-14 DIAGNOSIS — Z85048 Personal history of other malignant neoplasm of rectum, rectosigmoid junction, and anus: Secondary | ICD-10-CM | POA: Diagnosis present

## 2022-12-14 DIAGNOSIS — Z8543 Personal history of malignant neoplasm of ovary: Secondary | ICD-10-CM | POA: Diagnosis present

## 2022-12-14 MED ORDER — IOHEXOL 300 MG/ML  SOLN
100.0000 mL | Freq: Once | INTRAMUSCULAR | Status: AC | PRN
  Administered 2022-12-14: 100 mL via INTRAVENOUS

## 2022-12-15 ENCOUNTER — Encounter: Payer: Self-pay | Admitting: Family Medicine

## 2022-12-19 ENCOUNTER — Telehealth: Payer: Self-pay

## 2022-12-19 ENCOUNTER — Other Ambulatory Visit: Payer: Self-pay

## 2022-12-19 ENCOUNTER — Other Ambulatory Visit: Payer: Self-pay | Admitting: Family Medicine

## 2022-12-19 MED ORDER — DOXYCYCLINE HYCLATE 100 MG PO TABS: 100.0000 mg | ORAL_TABLET | Freq: Two times a day (BID) | ORAL | 0 refills | Status: DC

## 2022-12-19 MED ORDER — AMOXICILLIN-POT CLAVULANATE 875-125 MG PO TABS: 1.0000 | ORAL_TABLET | Freq: Two times a day (BID) | ORAL | 0 refills | Status: DC

## 2022-12-19 NOTE — Telephone Encounter (Signed)
Pt's CT encounter closed before documentation could be completed.   Susy Frizzle, MD 12/19/2022  6:49 AM EDT     I sent in augmentin and doxycycline to her pharmacy.  No zpack due to amiodarone.        1) no evidence of cancer 2) blockage in left ureter damaging left kidney, needs to see urology asap. 3)  Possible left side pneumonia.  Get CXR asap and start augmentin 875 mg pobid for 10 days and zpack.   Pt's husband Coralyn Mark advised and verbalized understanding of all.  Pt has appointment for Low Dose CT scan of chest scheduled for tomorrow and Coralyn Mark asks if this can replace CXR?  Pt currently sees Dr. Nicolette Bang with [facility].

## 2022-12-19 NOTE — Telephone Encounter (Signed)
Pt's CT encounter closed before documentation could be completed.    Susy Frizzle, MD 12/19/2022  6:49 AM EDT      I sent in augmentin and doxycycline to her pharmacy.  No zpack due to amiodarone.           1) no evidence of cancer 2) blockage in left ureter damaging left kidney, needs to see urology asap. 3)  Possible left side pneumonia.  Get CXR asap and start augmentin 875 mg pobid for 10 days and zpack.    Pt's husband Coralyn Mark advised and verbalized understanding of all.  Pt has appointment for Low Dose CT scan of chest scheduled for tomorrow and Coralyn Mark asks if this can replace CXR?  Pt currently sees Dr. Nicolette Bang with [facility]. CT results faxed to Dr. Noland Fordyce office to schedule appointment at [phone].

## 2022-12-20 ENCOUNTER — Ambulatory Visit (HOSPITAL_COMMUNITY)
Admission: RE | Admit: 2022-12-20 | Discharge: 2022-12-20 | Disposition: A | Discharge status: Home / Self Care | Payer: Medicare Other | Source: Ambulatory Visit | Attending: Family Medicine | Admitting: Family Medicine

## 2022-12-20 DIAGNOSIS — Z87891 Personal history of nicotine dependence: Secondary | ICD-10-CM | POA: Insufficient documentation

## 2022-12-20 DIAGNOSIS — Z122 Encounter for screening for malignant neoplasm of respiratory organs: Secondary | ICD-10-CM | POA: Diagnosis not present

## 2022-12-20 DIAGNOSIS — R911 Solitary pulmonary nodule: Secondary | ICD-10-CM | POA: Diagnosis not present

## 2022-12-20 DIAGNOSIS — R918 Other nonspecific abnormal finding of lung field: Secondary | ICD-10-CM | POA: Diagnosis not present

## 2022-12-20 DIAGNOSIS — F17200 Nicotine dependence, unspecified, uncomplicated: Secondary | ICD-10-CM

## 2022-12-25 ENCOUNTER — Other Ambulatory Visit: Payer: Self-pay | Admitting: Family Medicine

## 2022-12-25 ENCOUNTER — Other Ambulatory Visit: Payer: Self-pay

## 2022-12-25 DIAGNOSIS — F17200 Nicotine dependence, unspecified, uncomplicated: Secondary | ICD-10-CM

## 2022-12-25 DIAGNOSIS — J189 Pneumonia, unspecified organism: Secondary | ICD-10-CM

## 2022-12-25 NOTE — Progress Notes (Deleted)
    Cardiology Office Note  Date: 12/25/2022   ID: Abigail Wagner, DOB 1950-10-16, MRN [id number]  History of Present Illness: Abigail Wagner is a 72 y.o. female seen in September 2023.  Physical Exam: VS:  There were no vitals taken for this visit., BMI There is no height or weight on file to calculate BMI.  Wt Readings from Last 3 Encounters:  12/05/22 117 lb (53.1 kg)  10/30/22 113 lb (51.3 kg)  07/25/22 115 lb (52.2 kg)    General: Patient appears comfortable at rest. HEENT: Conjunctiva and lids normal, oropharynx clear with moist mucosa. Neck: Supple, no elevated JVP or carotid bruits, no thyromegaly. Lungs: Clear to auscultation, nonlabored breathing at rest. Cardiac: Regular rate and rhythm, no S3 or significant systolic murmur, no pericardial rub. Abdomen: Soft, nontender, no hepatomegaly, bowel sounds present, no guarding or rebound. Extremities: No pitting edema, distal pulses 2+. Skin: Warm and dry. Musculoskeletal: No kyphosis. Neuropsychiatric: Alert and oriented x3, affect grossly appropriate.  ECG:  An ECG dated 06/21/2022 was personally reviewed today and demonstrated:  Sinus rhythm with nonspecific ST changes.  Labwork: 10/30/2022: ALT 13; AST 24; BUN 22; Creat 0.84; Hemoglobin 9.6; Platelets 409; Potassium 4.5; Sodium 139; TSH 1.01     Component Value Date/Time   CHOL 132 10/30/2022 1008   TRIG 97 10/30/2022 1008   HDL 65 10/30/2022 1008   CHOLHDL 2.0 10/30/2022 1008   VLDL 15 06/29/2021 0519   LDLCALC 49 10/30/2022 1008   Other Studies Reviewed Today:   Assessment and Plan:  1.  Paroxysmal atrial fibrillation with CHA2DS2-VASc score of 4.  2.  Asymptomatic carotid artery disease.  Carotid Dopplers in October 2022 indicated moderate atherosclerotic plaque without hemodynamically significant stenosis.  Disposition:  Follow up {follow up:15908}  Signed, Satira Sark, M.D., F.A.C.C. [place] at [facility]

## 2022-12-25 NOTE — Telephone Encounter (Signed)
Prescription Request  12/25/2022  LOV: 10/30/2022  What is the name of the medication or equipment? omeprazole (PRILOSEC) 40 MG capsule  90 day supply  Have you contacted your pharmacy to request a refill? Yes   Which pharmacy would you like this sent to?  [id number] - [place], [place] [address] [address] Phone: [phone] Fax: [phone]    Patient notified that their request is being sent to the clinical staff for review and that they should receive a response within 2 business days.   Please advise at [facility] [phone]

## 2022-12-26 ENCOUNTER — Other Ambulatory Visit (HOSPITAL_COMMUNITY): Payer: Self-pay | Admitting: Psychiatry

## 2022-12-26 ENCOUNTER — Ambulatory Visit: Payer: Medicare Other | Admitting: Cardiology

## 2022-12-26 ENCOUNTER — Telehealth: Payer: Self-pay

## 2022-12-26 ENCOUNTER — Other Ambulatory Visit: Payer: Self-pay

## 2022-12-26 DIAGNOSIS — N200 Calculus of kidney: Secondary | ICD-10-CM

## 2022-12-26 DIAGNOSIS — N131 Hydronephrosis with ureteral stricture, not elsewhere classified: Secondary | ICD-10-CM

## 2022-12-26 DIAGNOSIS — I480 Paroxysmal atrial fibrillation: Secondary | ICD-10-CM

## 2022-12-26 MED ORDER — OMEPRAZOLE 40 MG PO CPDR: 40.0000 mg | DELAYED_RELEASE_CAPSULE | Freq: Every day | ORAL | 0 refills | Status: DC

## 2022-12-26 NOTE — Telephone Encounter (Signed)
Requested Prescriptions  Pending Prescriptions Disp Refills   omeprazole (PRILOSEC) 40 MG capsule 90 capsule 0    Sig: Take 1 capsule (40 mg total) by mouth daily.     Gastroenterology: Proton Pump Inhibitors Failed - 12/25/2022 12:59 PM      Failed - Valid encounter within last 12 months    Recent Outpatient Visits           1 year ago Hematuria, unspecified type   [place] Dennard Schaumann, Cammie Mcgee, MD   1 year ago Hematuria, unspecified type   [place] Dennard Schaumann Cammie Mcgee, MD   1 year ago Hematuria, unspecified type   [place] Eulogio Bear, NP   1 year ago Atrial fibrillation with RVR ([place])   [place] Pickard, Cammie Mcgee, MD   1 year ago Rapid heart beat   [place] Pickard, Cammie Mcgee, MD       Future Appointments             Today Satira Sark, MD [place] at [facility], [place]

## 2022-12-26 NOTE — Telephone Encounter (Signed)
[facility] called in with STAT results for this pt from a regular CT scan done at [facility].  Please call [phone]

## 2022-12-27 ENCOUNTER — Other Ambulatory Visit: Payer: Self-pay | Admitting: Family Medicine

## 2022-12-28 MED ORDER — OXYCODONE-ACETAMINOPHEN 7.5-325 MG PO TABS: 1.0000 | ORAL_TABLET | Freq: Four times a day (QID) | ORAL | 0 refills | Status: DC | PRN

## 2023-01-02 ENCOUNTER — Ambulatory Visit: Payer: Medicare Other | Admitting: Adult Health

## 2023-01-08 ENCOUNTER — Other Ambulatory Visit: Payer: Self-pay | Admitting: Family Medicine

## 2023-01-18 NOTE — Progress Notes (Signed)
    Cardiology Office Note  Date: 01/19/2023   ID: Abigail Wagner, DOB 11-14-1950, MRN [id number]  History of Present Illness: Abigail Wagner is a 72 y.o. female October 2023.  She is here today with family member for a follow-up visit.  Reports no palpitations or chest pain.  Recently completed course of antibiotics for URI per PCP.  She anticipates evaluation for cataract surgery in the near future as well.  I went over her medications.  She does not report any bleeding problems on Eliquis.  Also continues on amiodarone and Crestor.  Recent lab work shows normal TSH and LFTs.  LDL 49.  Physical Exam: VS:  BP 138/84   Pulse 63   Ht 5\' 5"  (1.651 m)   Wt 116 lb 12.8 oz (53 kg)   SpO2 100%   BMI 19.44 kg/m , BMI Body mass index is 19.44 kg/m.  Wt Readings from Last 3 Encounters:  01/19/23 116 lb 12.8 oz (53 kg)  12/05/22 117 lb (53.1 kg)  10/30/22 113 lb (51.3 kg)    General: Patient appears comfortable at rest. HEENT: Conjunctiva and lids normal. Neck: Supple, no elevated JVP or carotid bruits. Lungs: Decreased breath sounds without wheezing, nonlabored breathing at rest. Cardiac: Regular rate and rhythm, no S3 or significant systolic murmur. Extremities: No pitting edema.  ECG:  An ECG dated 06/21/2022 was personally reviewed today and demonstrated:  Sinus rhythm with nonspecific ST changes, normal QTc.  Labwork: 10/30/2022: ALT 13; AST 24; BUN 22; Creat 0.84; Hemoglobin 9.6; Platelets 409; Potassium 4.5; Sodium 139; TSH 1.01     Component Value Date/Time   CHOL 132 10/30/2022 1008   TRIG 97 10/30/2022 1008   HDL 65 10/30/2022 1008   CHOLHDL 2.0 10/30/2022 1008   VLDL 15 06/29/2021 0519   LDLCALC 49 10/30/2022 1008   Other Studies Reviewed Today:  No interval cardiac testing for review today.  Assessment and Plan:  1.  Paroxysmal atrial fibrillation with CHA2DS2-VASc score of 4.  Asymptomatic in terms of palpitations at this time.  She continues on Eliquis for stroke  prophylaxis.  Also amiodarone for rhythm suppression.  Recent TSH and LFTs normal.  No changes were made today.  2.  Asymptomatic carotid artery disease, mild by carotid Dopplers in October 2022.  She remains on Crestor with LDL 49 in February.  Follow-up carotid Dopplers for next visit in 6 months.  Disposition:  Follow up  6 months.  Signed, Jonelle Sidle, M.D., F.A.C.C. [facility] at [facility]

## 2023-01-19 ENCOUNTER — Ambulatory Visit: Payer: Medicare Other | Attending: Cardiology | Admitting: Cardiology

## 2023-01-19 ENCOUNTER — Encounter: Payer: Self-pay | Admitting: Cardiology

## 2023-01-19 VITALS — BP 138/84 | HR 63 | Ht 65.0 in | Wt 116.8 lb

## 2023-01-19 DIAGNOSIS — I480 Paroxysmal atrial fibrillation: Secondary | ICD-10-CM

## 2023-01-19 DIAGNOSIS — I779 Disorder of arteries and arterioles, unspecified: Secondary | ICD-10-CM

## 2023-01-19 DIAGNOSIS — I6523 Occlusion and stenosis of bilateral carotid arteries: Secondary | ICD-10-CM | POA: Diagnosis not present

## 2023-01-19 NOTE — Patient Instructions (Signed)
Medication Instructions:  Your physician recommends that you continue on your current medications as directed. Please refer to the Current Medication list given to you today.   Labwork: None today  Testing/Procedures: Your physician has requested that you have a carotid duplex in 6 months. This test is an ultrasound of the carotid arteries in your neck. It looks at blood flow through these arteries that supply the brain with blood. Allow one hour for this exam. There are no restrictions or special instructions.   Follow-Up: 6 months  Any Other Special Instructions Will Be Listed Below (If Applicable).  If you need a refill on your cardiac medications before your next appointment, please call your pharmacy.

## 2023-01-24 ENCOUNTER — Other Ambulatory Visit: Payer: Self-pay | Admitting: Family Medicine

## 2023-01-25 MED ORDER — OXYCODONE-ACETAMINOPHEN 7.5-325 MG PO TABS: 1.0000 | ORAL_TABLET | Freq: Four times a day (QID) | ORAL | 0 refills | Status: DC | PRN

## 2023-02-08 ENCOUNTER — Ambulatory Visit (INDEPENDENT_AMBULATORY_CARE_PROVIDER_SITE_OTHER): Payer: Medicare Other

## 2023-02-08 VITALS — Ht 65.0 in | Wt 116.0 lb

## 2023-02-08 DIAGNOSIS — Z0000 Encounter for general adult medical examination without abnormal findings: Secondary | ICD-10-CM

## 2023-02-08 NOTE — Progress Notes (Signed)
Subjective:   Abigail Wagner is a 72 y.o. female who presents for Medicare Annual (Subsequent) preventive examination.  I connected with  Abigail Wagner on 02/08/23 by a audio enabled telemedicine application and verified that I am speaking with the correct person using two identifiers.  Patient Location: Home  Provider Location: Home Office  I discussed the limitations of evaluation and management by telemedicine. The patient expressed understanding and agreed to proceed.  Review of Systems     Cardiac Risk Factors include: advanced age (>8men, >80 women);hypertension;sedentary lifestyle     Objective:    Today's Vitals   02/08/23 1036  Weight: 116 lb (52.6 kg)  Height: 5\' 5"  (1.651 m)   Body mass index is 19.3 kg/m.     02/08/2023   11:24 AM 02/27/2022   10:11 AM 02/06/2022    2:31 PM 02/01/2022   12:41 PM 01/27/2022   10:55 AM 11/18/2021   11:57 AM 11/03/2021    7:37 AM  Advanced Directives  Does Patient Have a Medical Advance Directive? Yes  Yes Yes Yes Yes Yes  Type of Advance Directive Living will;Healthcare Power of [employer] Power of [place];Living will Healthcare Power of [place];Living will Healthcare Power of [place];Living will Healthcare Power of [place];Living will Healthcare Power of [place];Living will Healthcare Power of [place];Living will  Does patient want to make changes to medical advance directive? No - Patient declined  No - Patient declined No - Patient declined     Copy of Healthcare Power of Attorney in Chart? No - copy requested No - copy requested No - copy requested No - copy requested No - copy requested No - copy requested No - copy requested    Current Medications (verified) Outpatient Encounter Medications as of 02/08/2023  Medication Sig   amiodarone (PACERONE) 200 MG tablet Take 1 tablet (200 mg total) by mouth daily.   Aspirin-Salicylamide-Caffeine (BC HEADACHE POWDER PO) Take 1-2 Packages by mouth daily at 12 noon.   b complex  vitamins tablet Take 1 tablet by mouth daily.   Cholecalciferol (VITAMIN D-3) 125 MCG (5000 UT) TABS Take 5,000 Units by mouth daily.   clotrimazole (LOTRIMIN) 1 % cream Apply 1 application topically 2 (two) times daily as needed (itching around ankles).   Coenzyme Q10 (CO Q 10) 100 MG CAPS Take 100 mg by mouth daily.   divalproex (DEPAKOTE ER) 500 MG 24 hr tablet Take 1 tablet (500 mg total) by mouth daily.   ELIQUIS 5 MG TABS tablet TAKE 1 TABLET BY MOUTH TWICE A DAY   escitalopram (LEXAPRO) 20 MG tablet Take 1 tablet (20 mg total) by mouth daily.   estradiol (ESTRACE) 0.1 MG/GM vaginal cream Apply a pea size amount of cream to urethral area of vagina twice weekly   Ferrous Gluconate (IRON 27 PO) Take 27 mg by mouth daily.   Homeopathic Products (THERAWORX RELIEF EX) Apply 1 application. topically daily as needed (joint pain).   Krill Oil 350 MG CAPS Take 350 mg by mouth daily.   LORazepam (ATIVAN) 0.5 MG tablet TAKE 1 TABLET (0.5 MG TOTAL) BY MOUTH 3 (THREE) TIMES DAILY AS NEEDED FOR ANXIETY.   Multiple Vitamin (MULTIVITAMIN) tablet Take 1 tablet by mouth daily.   Multiple Vitamins-Minerals (HAIR SKIN & NAILS ADVANCED PO) Take 3 tablets by mouth daily.   omeprazole (PRILOSEC) 40 MG capsule Take 1 capsule (40 mg total) by mouth daily.   oxyCODONE-acetaminophen (PERCOCET) 7.5-325 MG tablet Take 1 tablet by mouth every 6 (six)  hours as needed for severe pain. This script should last 30 days.  60 per month Ok to fill after 11/27/22   polyethylene glycol (MIRALAX / GLYCOLAX) 17 g packet Take 17 g by mouth daily.   rosuvastatin (CRESTOR) 20 MG tablet TAKE 1 TABLET BY MOUTH EVERY DAY   [DISCONTINUED] amoxicillin-clavulanate (AUGMENTIN) 875-125 MG tablet Take 1 tablet by mouth 2 (two) times daily.   [DISCONTINUED] doxycycline (VIBRA-TABS) 100 MG tablet Take 1 tablet (100 mg total) by mouth 2 (two) times daily.   No facility-administered encounter medications on file as of 02/08/2023.    Allergies  (verified) Morphine and codeine   History: Past Medical History:  Diagnosis Date   Allergy    Anal carcinoma (HCC)    Chemo and radiation   Anemia    Anxiety    Dr. Tenny Craw at [facility] (psychiatrist).     Blood transfusion without reported diagnosis 2017   Carotid artery disease (HCC)    Chronic kidney disease    DDD (degenerative disc disease), lumbosacral    GERD (gastroesophageal reflux disease)    Headache(784.0)    History of adenomatous polyp of colon 10/07/2015   Tubular adenoma high grade dysplasia   History of herpes genitalis    History of kidney stones    History of suicide attempt 2010   HTN (hypertension)    Hyperlipidemia    IBS (irritable bowel syndrome)    Major depression    OA (osteoarthritis)    Ovarian cancer (HCC)    Stage IIIB papillary ovarian carcinoma  s/p omentectomy and BSO & chemotherapy (completed 12-07-2014)   Paroxysmal atrial fibrillation (HCC)    Prolapse of vaginal vault after hysterectomy 07/20/2015   Rectovaginal fistula 08/05/2015   Seasonal allergies    Sigmoid diverticulosis    Smokers' cough [facility])    Past Surgical History:  Procedure Laterality Date   BIOPSY  10/12/2020   Procedure: BIOPSY;  Surgeon: Dolores Frame, MD;  [facility];  Service: Gastroenterology;;   BREAST ENHANCEMENT SURGERY  1986   BREAST SURGERY  1986   CESAREAN SECTION  1978   COLONOSCOPY N/A 10/07/2015   Procedure: COLONOSCOPY;  Surgeon: Malissa Hippo, MD;  [facility];  Service: Endoscopy;  Laterality: N/A;  7:30   COLONOSCOPY WITH PROPOFOL N/A 10/12/2020   Procedure: COLONOSCOPY WITH PROPOFOL;  Surgeon: Dolores Frame, MD;  [facility];  Service: Gastroenterology;  Laterality: N/A;  9:00   COSMETIC SURGERY  1986   CYSTOSCOPY N/A 02/27/2022   Procedure: CYSTOSCOPY;  Surgeon: Malen Gauze, MD;  Location: [facility];  Service: Urology;  Laterality: N/A;   CYSTOSCOPY WITH FULGERATION N/A 11/03/2021    Procedure: CYSTOSCOPY WITH FULGERATION;  Surgeon: Malen Gauze, MD;  Location: [facility];  Service: Urology;  Laterality: N/A;   ESOPHAGOGASTRODUODENOSCOPY (EGD) WITH PROPOFOL N/A 10/12/2020   Procedure: ESOPHAGOGASTRODUODENOSCOPY (EGD) WITH PROPOFOL;  Surgeon: Dolores Frame, MD;  [facility];  Service: Gastroenterology;  Laterality: N/A;   EXPLORATORY LAPAROTOMY/ OMENTECTOMY/  BILATERAL SALPINGOOPHORECTOMY/  PORT-A-CATH PLACEMENT  07-03-2014   [place]   KNEE ARTHROSCOPY Left 09/22/2004   LAPAROSCOPY N/A 06/02/2014   Procedure: DIAGNOSTIC LAPAROSCOPY, OMENTAL BIOPSY, RIGHT OVARY BIOPSY, LYSIS OF ADHESIONS;  Surgeon: Claud Kelp, MD;  Location: [facility];  Service: General;  Laterality: N/A;   MUCOSAL ADVANCEMENT FLAP N/A 06/15/2016   Procedure: EXCISION RECTOVAGINAOL FISTULA WITH MUCOSAL ADVANCEMENT FLAP;  Surgeon: Romie Levee, MD;  Location: [facility];  Service: General;  Laterality: N/A;   PLACEMENT OF SETON N/A 09/09/2015   Procedure: PLACEMENT OF SETON;  Surgeon: Romie Levee, MD;  Location: [facility];  Service: General;  Laterality: N/A;   PORT-A-CATH REMOVAL Right 08/17/2014   Procedure: REMOVAL INTRAPERITONEAL CHEMO PORT;  Surgeon: Claud Kelp, MD;  Location: [facility];  Service: General;  Laterality: Right;   PORTACATH PLACEMENT Right 08/17/2014   Procedure:  PLACE NEW PORT A CATH;  Surgeon: Claud Kelp, MD;  Location: [facility];  Service: General;  Laterality: Right;   RECTAL BIOPSY N/A 09/09/2015   Procedure: BIOPSY OF RECTOVAGINAL MASS;  Surgeon: Romie Levee, MD;  Location: [facility];  Service: General;  Laterality: N/A;   TRANSURETHRAL RESECTION OF BLADDER TUMOR N/A 02/27/2022   Procedure: TRANSURETHRAL RESECTION OF BLADDER TUMOR (TURBT);  Surgeon: Malen Gauze, MD;  Location: [facility];  Service: Urology;  Laterality: N/A;   TUBAL LIGATION  YRS AGO    VAGINAL HYSTERECTOMY  1981   fibroids   Family History  Problem Relation Age of Onset   Bipolar disorder Mother    Anxiety disorder Mother    Dementia Mother    Depression Mother    Mental illness Mother    Vision loss Mother    Alzheimer's disease Mother    Alcohol abuse Paternal Uncle    Bipolar disorder Maternal Grandmother    Dementia Maternal Grandmother    Alzheimer's disease Maternal Grandmother    Alcohol abuse Paternal Uncle    Stroke Brother    Deep vein thrombosis Son    Heart disease Father    Hyperlipidemia Father    Hypertension Father    Stroke Father    Vision loss Father    Atrial fibrillation Sister    Heart disease Brother    Heart disease Brother    ADD / ADHD Neg Hx    Drug abuse Neg Hx    OCD Neg Hx    Paranoid behavior Neg Hx    Schizophrenia Neg Hx    Seizures Neg Hx    Sexual abuse Neg Hx    Physical abuse Neg Hx    Social History   Socioeconomic History   Marital status: Married    Spouse name: Aurther Loft   Number of children: 4   Years of education: 12   Highest education level: Not on file  Occupational History   Occupation: retire    Comment: [employer]  Tobacco Use   Smoking status: Former    Packs/day: 0.50    Years: 15.00    Additional pack years: 0.00    Total pack years: 7.50    Types: Cigarettes    Quit date: 02/16/2018    Years since quitting: 4.9   Smokeless tobacco: Never   Tobacco comments:    half pack a day for 15 yrs  Vaping Use   Vaping Use: Never used  Substance and Sexual Activity   Alcohol use: No   Drug use: No   Sexual activity: Not Currently    Birth control/protection: Surgical    Comment: hyst  Other Topics Concern   Not on file  Social History Narrative   Lives at home with Aurther Loft   Retired [employer]   Married 02/16/1990   Several grandchildren    Social Determinants of Health   Financial Resource Strain: Low Risk  (02/08/2023)   Overall Financial Resource Strain (CARDIA)    Difficulty of Paying  Living Expenses:  Not hard at all  Food Insecurity: No Food Insecurity (02/08/2023)   Hunger Vital Sign    Worried About Running Out of Food in the Last Year: Never true    Ran Out of Food in the Last Year: Never true  Transportation Needs: No Transportation Needs (02/08/2023)   PRAPARE - Administrator, Civil Service (Medical): No    Lack of Transportation (Non-Medical): No  Physical Activity: Insufficiently Active (02/08/2023)   Exercise Vital Sign    Days of Exercise per Week: 2 days    Minutes of Exercise per Session: 20 min  Stress: No Stress Concern Present (02/08/2023)   [employer] of Occupational Health - Occupational Stress Questionnaire    Feeling of Stress : Only a little  Social Connections: Moderately Integrated (02/08/2023)   Social Connection and Isolation Panel [NHANES]    Frequency of Communication with Friends and Family: More than three times a week    Frequency of Social Gatherings with Friends and Family: Once a week    Attends Religious Services: More than 4 times per year    Active Member of [employer] or Organizations: No    Attends Engineer, structural: Never    Marital Status: Married    Tobacco Counseling Counseling given: Not Answered Tobacco comments: half pack a day for 15 yrs   Clinical Intake:  Pre-visit preparation completed: Yes  Pain : No/denies pain     Diabetes: No  How often do you need to have someone help you when you read instructions, pamphlets, or other written materials from your doctor or pharmacy?: 3 - Sometimes  Diabetic?No   Interpreter Needed?: No  Information entered by :: Kandis Fantasia LPN   Activities of Daily Living    02/08/2023   11:23 AM 02/02/2023    2:15 PM  In your present state of health, do you have any difficulty performing the following activities:  Hearing? 0 0  Vision? 1 1  Difficulty concentrating or making decisions? 1 1  Walking or climbing stairs? 0 0  Dressing or bathing? 0  0  Doing errands, shopping? 1 1  Preparing Food and eating ? N N  Using the Toilet? N N  In the past six months, have you accidently leaked urine? N N  Do you have problems with loss of bowel control? Y Y  Managing your Medications? N N  Managing your Finances? N N  Housekeeping or managing your Housekeeping? N N    Patient Care Team: Donita Brooks, MD as PCP - General (Family Medicine) Jonelle Sidle, MD as PCP - Cardiology (Cardiology) Malissa Hippo, MD (Inactive) as Consulting Physician (Gastroenterology) Myrlene Broker, MD as Consulting Physician (Behavioral Health) Romie Levee, MD as Consulting Physician (General Surgery) Jacalyn Lefevre Simone Curia (Inactive) as Physician Assistant (Oncology) Eldred Manges, MD as Consulting Physician (Orthopedic Surgery) Erroll Luna, [facility] as Pharmacist (Pharmacist)  Indicate any recent Medical Services you may have received from other than [facility] providers in the past year (date may be approximate).     Assessment:   This is a routine wellness examination for Kirstina.  Hearing/Vision screen Hearing Screening - Comments:: Denies hearing difficulties   Vision Screening - Comments:: up to date with routine eye exams with [facility]    Dietary issues and exercise activities discussed: Current Exercise Habits: Home exercise routine, Type of exercise: walking, Time (Minutes): 20, Frequency (Times/Week): 2, Weekly Exercise (Minutes/Week): 40, Intensity: Mild   Goals Addressed  This Visit's Progress    COMPLETED: Track and Manage My Blood Pressure-Hypertension       Timeframe:  Long-Range Goal Priority:  High Start Date:   03/08/21                          Expected End Date:     09/07/21                  Follow Up Date 06/24/21    - check blood pressure 3 times per week - choose a place to take my blood pressure (home, clinic or office, retail store) - write blood pressure results in a log or  diary    Why is this important?   You won't feel high blood pressure, but it can still hurt your blood vessels.  High blood pressure can cause heart or kidney problems. It can also cause a stroke.  Making lifestyle changes like losing a little weight or eating less salt will help.  Checking your blood pressure at home and at different times of the day can help to control blood pressure.  If the doctor prescribes medicine remember to take it the way the doctor ordered.  Call the office if you cannot afford the medicine or if there are questions about it.     Notes:       Depression Screen    02/08/2023   11:22 AM 10/30/2022    9:40 AM 07/25/2022    9:59 AM 07/04/2022    8:55 AM 01/27/2022   10:51 AM 12/21/2021    9:43 AM 07/26/2021   10:05 AM  PHQ 2/9 Scores  PHQ - 2 Score 0 0 0 4 0    PHQ- 9 Score   0 7        Information is confidential and restricted. Go to Review Flowsheets to unlock data.    Fall Risk    02/08/2023   11:23 AM 02/02/2023    2:15 PM 12/05/2022    3:14 PM 10/30/2022    9:40 AM 07/25/2022    9:59 AM  Fall Risk   Falls in the past year? 1 1 1  0 0  Number falls in past yr: 1 1 1  0 0  Injury with Fall? 0 0 1 0 0  Risk for fall due to : History of fall(s);Impaired balance/gait   No Fall Risks No Fall Risks  Follow up Falls prevention discussed;Education provided;Falls evaluation completed   Falls prevention discussed Falls prevention discussed    FALL RISK PREVENTION PERTAINING TO THE HOME:  Any stairs in or around the home? Yes  If so, are there any without handrails? No  Home free of loose throw rugs in walkways, pet beds, electrical cords, etc? Yes  Adequate lighting in your home to reduce risk of falls? Yes   ASSISTIVE DEVICES UTILIZED TO PREVENT FALLS:  Life alert? No  Use of a cane, walker or w/c? No  Grab bars in the bathroom? Yes  Shower chair or bench in shower? No  Elevated toilet seat or a handicapped toilet? Yes   TIMED UP AND GO:  Was the  test performed? No . Telephonic visit   Cognitive Function:        02/08/2023   11:24 AM 01/27/2022   10:59 AM 01/10/2021    9:56 AM  6CIT Screen  What Year? 0 points 0 points 0 points  What month? 0 points 0 points 0 points  What  time? 0 points 0 points 0 points  Count back from 20 0 points 0 points 0 points  Months in reverse 0 points 2 points 0 points  Repeat phrase 2 points 2 points 0 points  Total Score 2 points 4 points 0 points    Immunizations Immunization History  Administered Date(s) Administered   COVID-19, mRNA, vaccine(Comirnaty)12 years and older 11/08/2022   Fluad Quad(high Dose 65+) 08/16/2020, 10/30/2022   Influenza, High Dose Seasonal PF 07/17/2017, 07/09/2018, 06/16/2019   Influenza-Unspecified 10/04/2016   Moderna Sars-Covid-2 Vaccination 12/25/2019, 01/24/2020, 08/16/2020   Pneumococcal Conjugate-13 11/07/2016   Pneumococcal Polysaccharide-23 05/14/2018   Respiratory Syncytial Virus Vaccine,Recomb Aduvanted(Arexvy) 11/08/2022   Tdap 09/25/2008    TDAP status: Up to date  Pneumococcal vaccine status: Up to date  Covid-19 vaccine status: Information provided on how to obtain vaccines.   Qualifies for Shingles Vaccine? Yes   Zostavax completed No   Shingrix Completed?: No.    Education has been provided regarding the importance of this vaccine. Patient has been advised to call insurance company to determine out of pocket expense if they have not yet received this vaccine. Advised may also receive vaccine at local pharmacy or Health Dept. Verbalized acceptance and understanding.  Screening Tests Health Maintenance  Topic Date Due   Zoster Vaccines- Shingrix (1 of 2) Never done   COVID-19 Vaccine (5 - 2023-24 season) 01/03/2023   Medicare Annual Wellness (AWV)  02/08/2024   MAMMOGRAM  11/13/2024   COLONOSCOPY (Pts 45-3yrs Insurance coverage will need to be confirmed)  10/12/2030   Pneumonia Vaccine 35+ Years old  Completed   DEXA SCAN  Completed    Hepatitis C Screening  Completed   HPV VACCINES  Aged Out   DTaP/Tdap/Td  Discontinued    Health Maintenance  Health Maintenance Due  Topic Date Due   Zoster Vaccines- Shingrix (1 of 2) Never done   COVID-19 Vaccine (5 - 2023-24 season) 01/03/2023    Colorectal cancer screening: Type of screening: Colonoscopy. Completed 10/12/20. Repeat every 10 years  Mammogram status: Completed 11/13/22. Repeat every year  Bone Density status: Completed 11/13/22. Results reflect: Bone density results: OSTEOPENIA. Repeat every 2 years.  Lung Cancer Screening: (Low Dose CT Chest recommended if Age 41-80 years, 30 pack-year currently smoking OR have quit w/in 15years.) does not qualify.   Lung Cancer Screening Referral: n/a  Additional Screening:  Hepatitis C Screening: does qualify; Completed 12/31/16  Vision Screening: Recommended annual ophthalmology exams for early detection of glaucoma and other disorders of the eye. Is the patient up to date with their annual eye exam?  Yes  Who is the provider or what is the name of the office in which the patient attends annual eye exams? [facility]  If pt is not established with a provider, would they like to be referred to a provider to establish care? No .   Dental Screening: Recommended annual dental exams for proper oral hygiene  Community Resource Referral / Chronic Care Management: CRR required this visit?  No   CCM required this visit?  No      Plan:     I have personally reviewed and noted the following in the patient's chart:   Medical and social history Use of alcohol, tobacco or illicit drugs  Current medications and supplements including opioid prescriptions. Patient is currently taking opioid prescriptions. Information provided to patient regarding non-opioid alternatives. Patient advised to discuss non-opioid treatment plan with their provider. Functional ability and status Nutritional status Physical  activity Advanced  directives List of other physicians Hospitalizations, surgeries, and ER visits in previous 12 months Vitals Screenings to include cognitive, depression, and falls Referrals and appointments  In addition, I have reviewed and discussed with patient certain preventive protocols, quality metrics, and best practice recommendations. A written personalized care plan for preventive services as well as general preventive health recommendations were provided to patient.     Durwin Nora, [place]   7/82/9562   Due to this being a virtual visit, the after visit summary with patients personalized plan was offered to patient via mail or my-chart.  Patient would like to access on my-chart  Nurse Notes: No concerns

## 2023-02-08 NOTE — Patient Instructions (Signed)
[name] , Thank you for taking time to come for your Medicare Wellness Visit. I appreciate your ongoing commitment to your health goals. Please review the following plan we discussed and let me know if I can assist you in the future.   These are the goals we discussed:  Goals      Exercise 3x per week (30 min per time)           Quit Smoking        This is a list of the screening recommended for you and due dates:  Health Maintenance  Topic Date Due   Zoster (Shingles) Vaccine (1 of 2) Never done   COVID-19 Vaccine (5 - 2023-24 season) 01/03/2023   Medicare Annual Wellness Visit  02/08/2024   Mammogram  11/13/2024   Colon Cancer Screening  10/12/2030   Pneumonia Vaccine  Completed   DEXA scan (bone density measurement)  Completed   Hepatitis C Screening: USPSTF Recommendation to screen - Ages 26-79 yo.  Completed   HPV Vaccine  Aged Out   DTaP/Tdap/Td vaccine  Discontinued    Advanced directives: Please bring a copy of your health care power of attorney and living will to the office to be added to your chart at your convenience.  Conditions/risks identified: Aim for 30 minutes of exercise or brisk walking, 6-8 glasses of water, and 5 servings of fruits and vegetables each day.  Next appointment: Follow up in one year for your annual wellness visit    Preventive Care 65 Years and Older, Female Preventive care refers to lifestyle choices and visits with your health care provider that can promote health and wellness. What does preventive care include? A yearly physical exam. This is also called an annual well check. Dental exams once or twice a year. Routine eye exams. Ask your health care provider how often you should have your eyes checked. Personal lifestyle choices, including: Daily care of your teeth and gums. Regular physical activity. Eating a healthy diet. Avoiding tobacco and drug use. Limiting alcohol use. Practicing safe sex. Taking low-dose aspirin every  day. Taking vitamin and mineral supplements as recommended by your health care provider. What happens during an annual well check? The services and screenings done by your health care provider during your annual well check will depend on your age, overall health, lifestyle risk factors, and family history of disease. Counseling  Your health care provider may ask you questions about your: Alcohol use. Tobacco use. Drug use. Emotional well-being. Home and relationship well-being. Sexual activity. Eating habits. History of falls. Memory and ability to understand (cognition). Work and work Astronomer. Reproductive health. Screening  You may have the following tests or measurements: Height, weight, and BMI. Blood pressure. Lipid and cholesterol levels. These may be checked every 5 years, or more frequently if you are over 69 years old. Skin check. Lung cancer screening. You may have this screening every year starting at age 39 if you have a 30-pack-year history of smoking and currently smoke or have quit within the past 15 years. Fecal occult blood test (FOBT) of the stool. You may have this test every year starting at age 54. Flexible sigmoidoscopy or colonoscopy. You may have a sigmoidoscopy every 5 years or a colonoscopy every 10 years starting at age 91. Hepatitis C blood test. Hepatitis B blood test. Sexually transmitted disease (STD) testing. Diabetes screening. This is done by checking your blood sugar (glucose) after you have not eaten for a while (fasting). You may  have this done every 1-3 years. Bone density scan. This is done to screen for osteoporosis. You may have this done starting at age 17. Mammogram. This may be done every 1-2 years. Talk to your health care provider about how often you should have regular mammograms. Talk with your health care provider about your test results, treatment options, and if necessary, the need for more tests. Vaccines  Your health care  provider may recommend certain vaccines, such as: Influenza vaccine. This is recommended every year. Tetanus, diphtheria, and acellular pertussis (Tdap, Td) vaccine. You may need a Td booster every 10 years. Zoster vaccine. You may need this after age 55. Pneumococcal 13-valent conjugate (PCV13) vaccine. One dose is recommended after age 16. Pneumococcal polysaccharide (PPSV23) vaccine. One dose is recommended after age 8. Talk to your health care provider about which screenings and vaccines you need and how often you need them. This information is not intended to replace advice given to you by your health care provider. Make sure you discuss any questions you have with your health care provider. Document Released: 10/08/2015 Document Revised: 05/31/2016 Document Reviewed: 07/13/2015 Elsevier Interactive Patient Education  2017 [place] Prevention in the Home Falls can cause injuries. They can happen to people of all ages. There are many things you can do to make your home safe and to help prevent falls. What can I do on the outside of my home? Regularly fix the edges of walkways and driveways and fix any cracks. Remove anything that might make you trip as you walk through a door, such as a raised step or threshold. Trim any bushes or trees on the path to your home. Use bright outdoor lighting. Clear any walking paths of anything that might make someone trip, such as rocks or tools. Regularly check to see if handrails are loose or broken. Make sure that both sides of any steps have handrails. Any raised decks and porches should have guardrails on the edges. Have any leaves, snow, or ice cleared regularly. Use sand or salt on walking paths during winter. Clean up any spills in your garage right away. This includes oil or grease spills. What can I do in the bathroom? Use night lights. Install grab bars by the toilet and in the tub and shower. Do not use towel bars as grab  bars. Use non-skid mats or decals in the tub or shower. If you need to sit down in the shower, use a plastic, non-slip stool. Keep the floor dry. Clean up any water that spills on the floor as soon as it happens. Remove soap buildup in the tub or shower regularly. Attach bath mats securely with double-sided non-slip rug tape. Do not have throw rugs and other things on the floor that can make you trip. What can I do in the bedroom? Use night lights. Make sure that you have a light by your bed that is easy to reach. Do not use any sheets or blankets that are too big for your bed. They should not hang down onto the floor. Have a firm chair that has side arms. You can use this for support while you get dressed. Do not have throw rugs and other things on the floor that can make you trip. What can I do in the kitchen? Clean up any spills right away. Avoid walking on wet floors. Keep items that you use a lot in easy-to-reach places. If you need to reach something above you, use a strong step  stool that has a grab bar. Keep electrical cords out of the way. Do not use floor polish or wax that makes floors slippery. If you must use wax, use non-skid floor wax. Do not have throw rugs and other things on the floor that can make you trip. What can I do with my stairs? Do not leave any items on the stairs. Make sure that there are handrails on both sides of the stairs and use them. Fix handrails that are broken or loose. Make sure that handrails are as long as the stairways. Check any carpeting to make sure that it is firmly attached to the stairs. Fix any carpet that is loose or worn. Avoid having throw rugs at the top or bottom of the stairs. If you do have throw rugs, attach them to the floor with carpet tape. Make sure that you have a light switch at the top of the stairs and the bottom of the stairs. If you do not have them, ask someone to add them for you. What else can I do to help prevent  falls? Wear shoes that: Do not have high heels. Have rubber bottoms. Are comfortable and fit you well. Are closed at the toe. Do not wear sandals. If you use a stepladder: Make sure that it is fully opened. Do not climb a closed stepladder. Make sure that both sides of the stepladder are locked into place. Ask someone to hold it for you, if possible. Clearly mark and make sure that you can see: Any grab bars or handrails. First and last steps. Where the edge of each step is. Use tools that help you move around (mobility aids) if they are needed. These include: Canes. Walkers. Scooters. Crutches. Turn on the lights when you go into a dark area. Replace any light bulbs as soon as they burn out. Set up your furniture so you have a clear path. Avoid moving your furniture around. If any of your floors are uneven, fix them. If there are any pets around you, be aware of where they are. Review your medicines with your doctor. Some medicines can make you feel dizzy. This can increase your chance of falling. Ask your doctor what other things that you can do to help prevent falls. This information is not intended to replace advice given to you by your health care provider. Make sure you discuss any questions you have with your health care provider. Document Released: 07/08/2009 Document Revised: 02/17/2016 Document Reviewed: 10/16/2014 Elsevier Interactive Patient Education  2017 [employer].

## 2023-02-21 ENCOUNTER — Other Ambulatory Visit: Payer: Self-pay | Admitting: Family Medicine

## 2023-02-23 MED ORDER — OXYCODONE-ACETAMINOPHEN 7.5-325 MG PO TABS: 1.0000 | ORAL_TABLET | Freq: Four times a day (QID) | ORAL | 0 refills | Status: DC | PRN

## 2023-03-19 ENCOUNTER — Telehealth (INDEPENDENT_AMBULATORY_CARE_PROVIDER_SITE_OTHER): Payer: Medicare Other | Admitting: Psychiatry

## 2023-03-19 ENCOUNTER — Encounter (HOSPITAL_COMMUNITY): Payer: Self-pay | Admitting: Psychiatry

## 2023-03-19 DIAGNOSIS — F319 Bipolar disorder, unspecified: Secondary | ICD-10-CM

## 2023-03-19 DIAGNOSIS — F331 Major depressive disorder, recurrent, moderate: Secondary | ICD-10-CM

## 2023-03-19 MED ORDER — LORAZEPAM 0.5 MG PO TABS: 0.5000 mg | ORAL_TABLET | Freq: Three times a day (TID) | ORAL | 3 refills | Status: DC | PRN

## 2023-03-19 MED ORDER — ESCITALOPRAM OXALATE 20 MG PO TABS: 20.0000 mg | ORAL_TABLET | Freq: Every day | ORAL | 3 refills | Status: DC

## 2023-03-19 MED ORDER — DIVALPROEX SODIUM ER 500 MG PO TB24: 500.0000 mg | ORAL_TABLET | Freq: Every day | ORAL | 3 refills | Status: DC

## 2023-03-19 NOTE — Progress Notes (Signed)
Virtual Visit via Telephone Note  I connected with Abigail Wagner on 03/19/23 at 11:20 AM EDT by telephone and verified that I am speaking with the correct person using two identifiers.  Location: Patient: home Provider: office   I discussed the limitations, risks, security and privacy concerns of performing an evaluation and management service by telephone and the availability of in person appointments. I also discussed with the patient that there may be a patient responsible charge related to this service. The patient expressed understanding and agreed to proceed.    I discussed the assessment and treatment plan with the patient. The patient was provided an opportunity to ask questions and all were answered. The patient agreed with the plan and demonstrated an understanding of the instructions.   The patient was advised to call back or seek an in-person evaluation if the symptoms worsen or if the condition fails to improve as anticipated.  I provided 15 minutes of non-face-to-face time during this encounter.   Abigail Ruder, MD  [facility] MD/PA/NP OP Progress Note  03/19/2023 11:43 AM Abigail Wagner  MRN:  [id number]  Chief Complaint:  Chief Complaint  Patient presents with   Depression   Anxiety   Follow-up   HPI: This patient is a 72 year old married white female lives with her husband in [place]. She has 3 children and 5 grandchildren. She is retired from [employer].   The patient states she's had depression since her 44s. She was hospitalized several times, last time being in 2010 when she took a drug overdose. She was going through a lot of family stress back then. She states that she was [facility] and since then she has never wanted to go back. She's been quite stable despite her low doses of medication. She's also stopped drinking alcohol which is made a big difference  The patient returns for follow-up after 4 months regarding her depression anxiety  and bipolar disorder.  She states she is stressed right now because her air conditioning in her house is not working very well and is quite hot outside.  They are waiting for an air conditioner technician to come in.  She seems a bit flustered but overall she states that her mood has been stable.  She states that she is eating better and getting good sleep.  She seems to get somewhat easily confused but is pleasant and denies significant depression or thoughts of self-harm.  She denies significant mood swings. Visit Diagnosis:    ICD-10-CM   1. Bipolar 1 disorder (HCC)  F31.9     2. Major depressive disorder, recurrent episode, moderate (HCC)  F33.1       Past Psychiatric History: Past hospitalizations for depression and alcohol abuse, none since 2010  Past Medical History:  Past Medical History:  Diagnosis Date   Allergy    Anal carcinoma (HCC)    Chemo and radiation   Anemia    Anxiety    Dr. Tenny Craw at [place] (psychiatrist).     Blood transfusion without reported diagnosis 2017   Carotid artery disease (HCC)    Chronic kidney disease    DDD (degenerative disc disease), lumbosacral    GERD (gastroesophageal reflux disease)    Headache(784.0)    History of adenomatous polyp of colon 10/07/2015   Tubular adenoma high grade dysplasia   History of herpes genitalis    History of kidney stones    History of suicide attempt 2010   HTN (hypertension)  Hyperlipidemia    IBS (irritable bowel syndrome)    Major depression    OA (osteoarthritis)    Ovarian cancer (HCC)    Stage IIIB papillary ovarian carcinoma  s/p omentectomy and BSO & chemotherapy (completed 12-07-2014)   Paroxysmal atrial fibrillation (HCC)    Prolapse of vaginal vault after hysterectomy 07/20/2015   Rectovaginal fistula 08/05/2015   Seasonal allergies    Sigmoid diverticulosis    Smokers' cough [facility])     Past Surgical History:  Procedure Laterality Date   BIOPSY  10/12/2020   Procedure: BIOPSY;  Surgeon:  Dolores Frame, MD;  [facility];  Service: Gastroenterology;;   BREAST ENHANCEMENT SURGERY  1986   BREAST SURGERY  1986   CESAREAN SECTION  1978   COLONOSCOPY N/A 10/07/2015   Procedure: COLONOSCOPY;  Surgeon: Malissa Hippo, MD;  [facility];  Service: Endoscopy;  Laterality: N/A;  7:30   COLONOSCOPY WITH PROPOFOL N/A 10/12/2020   Procedure: COLONOSCOPY WITH PROPOFOL;  Surgeon: Dolores Frame, MD;  [facility];  Service: Gastroenterology;  Laterality: N/A;  9:00   COSMETIC SURGERY  1986   CYSTOSCOPY N/A 02/27/2022   Procedure: CYSTOSCOPY;  Surgeon: Malen Gauze, MD;  Location: [facility];  Service: Urology;  Laterality: N/A;   CYSTOSCOPY WITH FULGERATION N/A 11/03/2021   Procedure: CYSTOSCOPY WITH FULGERATION;  Surgeon: Malen Gauze, MD;  Location: [facility];  Service: Urology;  Laterality: N/A;   ESOPHAGOGASTRODUODENOSCOPY (EGD) WITH PROPOFOL N/A 10/12/2020   Procedure: ESOPHAGOGASTRODUODENOSCOPY (EGD) WITH PROPOFOL;  Surgeon: Dolores Frame, MD;  [facility];  Service: Gastroenterology;  Laterality: N/A;   EXPLORATORY LAPAROTOMY/ OMENTECTOMY/  BILATERAL SALPINGOOPHORECTOMY/  PORT-A-CATH PLACEMENT  07-03-2014   [place]   KNEE ARTHROSCOPY Left 09/22/2004   LAPAROSCOPY N/A 06/02/2014   Procedure: DIAGNOSTIC LAPAROSCOPY, OMENTAL BIOPSY, RIGHT OVARY BIOPSY, LYSIS OF ADHESIONS;  Surgeon: Claud Kelp, MD;  Location: [facility];  Service: General;  Laterality: N/A;   MUCOSAL ADVANCEMENT FLAP N/A 06/15/2016   Procedure: EXCISION RECTOVAGINAOL FISTULA WITH MUCOSAL ADVANCEMENT FLAP;  Surgeon: Romie Levee, MD;  Location: [facility];  Service: General;  Laterality: N/A;   PLACEMENT OF SETON N/A 09/09/2015   Procedure: PLACEMENT OF SETON;  Surgeon: Romie Levee, MD;  Location: [facility];  Service: General;  Laterality: N/A;   PORT-A-CATH REMOVAL Right 08/17/2014   Procedure:  REMOVAL INTRAPERITONEAL CHEMO PORT;  Surgeon: Claud Kelp, MD;  Location: [facility];  Service: General;  Laterality: Right;   PORTACATH PLACEMENT Right 08/17/2014   Procedure:  PLACE NEW PORT A CATH;  Surgeon: Claud Kelp, MD;  Location: [facility];  Service: General;  Laterality: Right;   RECTAL BIOPSY N/A 09/09/2015   Procedure: BIOPSY OF RECTOVAGINAL MASS;  Surgeon: Romie Levee, MD;  Location: [facility];  Service: General;  Laterality: N/A;   TRANSURETHRAL RESECTION OF BLADDER TUMOR N/A 02/27/2022   Procedure: TRANSURETHRAL RESECTION OF BLADDER TUMOR (TURBT);  Surgeon: Malen Gauze, MD;  Location: [facility];  Service: Urology;  Laterality: N/A;   TUBAL LIGATION  YRS AGO   VAGINAL HYSTERECTOMY  1981   fibroids    Family Psychiatric History: See below  Family History:  Family History  Problem Relation Age of Onset   Bipolar disorder Mother    Anxiety disorder Mother    Dementia Mother    Depression Mother    Mental illness Mother    Vision loss Mother  Alzheimer's disease Mother    Alcohol abuse Paternal Uncle    Bipolar disorder Maternal Grandmother    Dementia Maternal Grandmother    Alzheimer's disease Maternal Grandmother    Alcohol abuse Paternal Uncle    Stroke Brother    Deep vein thrombosis Son    Heart disease Father    Hyperlipidemia Father    Hypertension Father    Stroke Father    Vision loss Father    Atrial fibrillation Sister    Heart disease Brother    Heart disease Brother    ADD / ADHD Neg Hx    Drug abuse Neg Hx    OCD Neg Hx    Paranoid behavior Neg Hx    Schizophrenia Neg Hx    Seizures Neg Hx    Sexual abuse Neg Hx    Physical abuse Neg Hx     Social History:  Social History   Socioeconomic History   Marital status: Married    Spouse name: Aurther Loft   Number of children: 4   Years of education: 12   Highest education level: Not on file  Occupational History   Occupation: retire     Comment: [employer]  Tobacco Use   Smoking status: Former    Packs/day: 0.50    Years: 15.00    Additional pack years: 0.00    Total pack years: 7.50    Types: Cigarettes    Quit date: 02/16/2018    Years since quitting: 5.0   Smokeless tobacco: Never   Tobacco comments:    half pack a day for 15 yrs  Vaping Use   Vaping Use: Never used  Substance and Sexual Activity   Alcohol use: No   Drug use: No   Sexual activity: Not Currently    Birth control/protection: Surgical    Comment: hyst  Other Topics Concern   Not on file  Social History Narrative   Lives at home with Aurther Loft   Retired [employer]   Married 02/16/1990   Several grandchildren    Social Determinants of Health   Financial Resource Strain: Low Risk  (02/08/2023)   Overall Financial Resource Strain (CARDIA)    Difficulty of Paying Living Expenses: Not hard at all  Food Insecurity: No Food Insecurity (02/08/2023)   Hunger Vital Sign    Worried About Running Out of Food in the Last Year: Never true    Ran Out of Food in the Last Year: Never true  Transportation Needs: No Transportation Needs (02/08/2023)   PRAPARE - Administrator, Civil Service (Medical): No    Lack of Transportation (Non-Medical): No  Physical Activity: Insufficiently Active (02/08/2023)   Exercise Vital Sign    Days of Exercise per Week: 2 days    Minutes of Exercise per Session: 20 min  Stress: No Stress Concern Present (02/08/2023)   [employer] of Occupational Health - Occupational Stress Questionnaire    Feeling of Stress : Only a little  Social Connections: Moderately Integrated (02/08/2023)   Social Connection and Isolation Panel [NHANES]    Frequency of Communication with Friends and Family: More than three times a week    Frequency of Social Gatherings with Friends and Family: Once a week    Attends Religious Services: More than 4 times per year    Active Member of [employer] or Organizations: No    Attends Tax inspector Meetings: Never    Marital Status: Married    Allergies:  Allergies  Allergen Reactions   Morphine And Codeine     Delirium with sbo    Metabolic Disorder Labs: No results found for: "HGBA1C", "MPG" No results found for: "PROLACTIN" Lab Results  Component Value Date   CHOL 132 10/30/2022   TRIG 97 10/30/2022   HDL 65 10/30/2022   CHOLHDL 2.0 10/30/2022   VLDL 15 06/29/2021   LDLCALC 49 10/30/2022   LDLCALC 74 06/29/2021   Lab Results  Component Value Date   TSH 1.01 10/30/2022   TSH 1.470 06/21/2022    Therapeutic Level Labs: No results found for: "LITHIUM" No results found for: "VALPROATE" No results found for: "CBMZ"  Current Medications: Current Outpatient Medications  Medication Sig Dispense Refill   amiodarone (PACERONE) 200 MG tablet Take 1 tablet (200 mg total) by mouth daily. 90 tablet 3   Aspirin-Salicylamide-Caffeine (BC HEADACHE POWDER PO) Take 1-2 Packages by mouth daily at 12 noon.     b complex vitamins tablet Take 1 tablet by mouth daily.     Cholecalciferol (VITAMIN D-3) 125 MCG (5000 UT) TABS Take 5,000 Units by mouth daily.     clotrimazole (LOTRIMIN) 1 % cream Apply 1 application topically 2 (two) times daily as needed (itching around ankles).     Coenzyme Q10 (CO Q 10) 100 MG CAPS Take 100 mg by mouth daily.     divalproex (DEPAKOTE ER) 500 MG 24 hr tablet Take 1 tablet (500 mg total) by mouth daily. 90 tablet 3   ELIQUIS 5 MG TABS tablet TAKE 1 TABLET BY MOUTH TWICE A DAY 180 tablet 0   escitalopram (LEXAPRO) 20 MG tablet Take 1 tablet (20 mg total) by mouth daily. 30 tablet 3   estradiol (ESTRACE) 0.1 MG/GM vaginal cream Apply a pea size amount of cream to urethral area of vagina twice weekly 42.5 g 12   Ferrous Gluconate (IRON 27 PO) Take 27 mg by mouth daily.     Homeopathic Products (THERAWORX RELIEF EX) Apply 1 application. topically daily as needed (joint pain).     Krill Oil 350 MG CAPS Take 350 mg by mouth daily.      LORazepam (ATIVAN) 0.5 MG tablet Take 1 tablet (0.5 mg total) by mouth 3 (three) times daily as needed for anxiety. 90 tablet 3   Multiple Vitamin (MULTIVITAMIN) tablet Take 1 tablet by mouth daily.     Multiple Vitamins-Minerals (HAIR SKIN & NAILS ADVANCED PO) Take 3 tablets by mouth daily.     omeprazole (PRILOSEC) 40 MG capsule Take 1 capsule (40 mg total) by mouth daily. 90 capsule 0   oxyCODONE-acetaminophen (PERCOCET) 7.5-325 MG tablet Take 1 tablet by mouth every 6 (six) hours as needed for severe pain. This script should last 30 days.  60 per month Ok to fill after 11/27/22 60 tablet 0   polyethylene glycol (MIRALAX / GLYCOLAX) 17 g packet Take 17 g by mouth daily. 30 each 1   rosuvastatin (CRESTOR) 20 MG tablet TAKE 1 TABLET BY MOUTH EVERY DAY 90 tablet 0   No current facility-administered medications for this visit.     Musculoskeletal: Strength & Muscle Tone: na Gait & Station: na Patient leans: N/A  Psychiatric Specialty Exam: Review of Systems  Psychiatric/Behavioral:  The patient is nervous/anxious.   All other systems reviewed and are negative.   There were no vitals taken for this visit.There is no height or weight on file to calculate BMI.  General Appearance: NA  Eye Contact:  NA  Speech:  Clear and Coherent  Volume:  Normal  Mood:  Anxious  Affect:  NA  Thought Process:  Goal Directed  Orientation:  Full (Time, Place, and Person)  Thought Content: Rumination   Suicidal Thoughts:  No  Homicidal Thoughts:  No  Memory:  Immediate;   Good Recent;   Good Remote;   NA  Judgement:  Fair  Insight:  Fair  Psychomotor Activity:  Decreased  Concentration:  Concentration: Fair and Attention Span: Fair  Recall:  [employer] of Knowledge: Fair  Language: Good  Akathisia:  No  Handed:  Right  AIMS (if indicated): not done  Assets:  Communication Skills Desire for Improvement Resilience Social Support  ADL's:  Intact  Cognition: WNL  Sleep:  Good    Screenings: GAD-7    Flowsheet Row Clinical Support from 01/10/2021 in [facility]  Total GAD-7 Score 8      PHQ2-9    Flowsheet Row Clinical Support from 02/08/2023 in [facility] [place] [facility] Office Visit from 10/30/2022 in [facility] Office Visit from 07/25/2022 in [facility] Office Visit from 07/04/2022 in [facility] Clinical Support from 01/27/2022 in [facility]  PHQ-2 Total Score 0 0 0 4 0  PHQ-9 Total Score -- -- 0 7 --      Flowsheet Row ED from 07/12/2022 in [facility] Admission (Discharged) from 02/27/2022 in [facility] Video Visit from 12/21/2021 in [facility] RISK CATEGORY No Risk No Risk No Risk        Assessment and Plan: This patient is a 72 year old female with a history of depression anxiety possible bipolar disorder iron deficiency anemia atrial fibrillation chronic pain and a history of cancer.  However she continues to do well on her current regimen.  She will continue Lexapro 20 mg daily for depression, Ativan 0.5 mg 3 times daily for anxiety, Depakote ER 500 mg at bedtime for mood stabilization.  She will return to see me in 4 months  Collaboration of Care: Collaboration of Care: Primary Care Provider AEB notes are shared with PCP on the epic system  Patient/Guardian was advised Release of Information must be obtained prior to any record release in order to collaborate their care with an outside provider. Patient/Guardian was advised if they have not already done so to contact the registration department to sign all necessary forms in order for [place] to release information regarding their care.   Consent: Patient/Guardian gives verbal consent for treatment and assignment of benefits for services provided during this visit. Patient/Guardian  expressed understanding and agreed to proceed.    Abigail Ruder, MD 03/19/2023, 11:43 AM

## 2023-03-23 ENCOUNTER — Other Ambulatory Visit: Payer: Self-pay | Admitting: Family Medicine

## 2023-03-26 MED ORDER — OXYCODONE-ACETAMINOPHEN 7.5-325 MG PO TABS: 1.0000 | ORAL_TABLET | Freq: Four times a day (QID) | ORAL | 0 refills | Status: DC | PRN

## 2023-03-26 NOTE — Telephone Encounter (Signed)
Requested medication (s) are due for refill today: yes   Requested medication (s) are on the active medication list: yes   Last refill:  12/26/22 #90 0 refills  Future visit scheduled: yes tomorrow   Notes to clinic:  no refills remain. Do you want to refill Rx? Add more refills     Requested Prescriptions  Pending Prescriptions Disp Refills   omeprazole (PRILOSEC) 40 MG capsule [Pharmacy Med Name: OMEPRAZOLE DR 40 MG CAPSULE] 90 capsule 0    Sig: Take 1 capsule (40 mg total) by mouth daily.     Gastroenterology: Proton Pump Inhibitors Failed - 03/23/2023  2:23 AM      Failed - Valid encounter within last 12 months    Recent Outpatient Visits           1 year ago Hematuria, unspecified type   [facility] Tanya Nones, Priscille Heidelberg, MD   1 year ago Hematuria, unspecified type   [facility] Tanya Nones Priscille Heidelberg, MD   1 year ago Hematuria, unspecified type   [facility] Valentino Nose, NP   1 year ago Atrial fibrillation with RVR (HCC)   [facility] Pickard, Priscille Heidelberg, MD   1 year ago Rapid heart beat   [facility] Pickard, Priscille Heidelberg, MD       Future Appointments             Tomorrow Pickard, Priscille Heidelberg, MD [facility]

## 2023-03-27 ENCOUNTER — Ambulatory Visit: Payer: Medicare Other | Admitting: Family Medicine

## 2023-03-31 ENCOUNTER — Other Ambulatory Visit: Payer: Self-pay | Admitting: Family Medicine

## 2023-04-02 NOTE — Telephone Encounter (Signed)
Requested medications are due for refill today.  yes  Requested medications are on the active medications list.  yes  Last refill. 01/09/2023 #180 0 rf  Future visit scheduled.   no  Notes to clinic.  Abnormal labs.    Requested Prescriptions  Pending Prescriptions Disp Refills   ELIQUIS 5 MG TABS tablet [Pharmacy Med Name: ELIQUIS 5 MG TABLET] 180 tablet 0    Sig: TAKE 1 TABLET BY MOUTH TWICE A DAY     Hematology:  Anticoagulants - apixaban Failed - 03/31/2023 11:15 AM      Failed - PLT in normal range and within 360 days    Platelets  Date Value Ref Range Status  10/30/2022 409 (H) 140 - 400 Thousand/uL Final         Failed - HGB in normal range and within 360 days    Hemoglobin  Date Value Ref Range Status  10/30/2022 9.6 (L) 11.7 - 15.5 g/dL Final         Failed - HCT in normal range and within 360 days    HCT  Date Value Ref Range Status  10/30/2022 29.1 (L) 35.0 - 45.0 % Final         Failed - Valid encounter within last 12 months    Recent Outpatient Visits           1 year ago Hematuria, unspecified type   [facility] Donita Brooks, MD   1 year ago Hematuria, unspecified type   [facility] Donita Brooks, MD   1 year ago Hematuria, unspecified type   [facility] Valentino Nose, NP   1 year ago Atrial fibrillation with RVR (HCC)   [facility] Pickard, Priscille Heidelberg, MD   1 year ago Rapid heart beat   [facility] Pickard, Priscille Heidelberg, MD              Passed - Cr in normal range and within 360 days    Creat  Date Value Ref Range Status  10/30/2022 0.84 0.60 - 1.00 mg/dL Final         Passed - AST in normal range and within 360 days    AST  Date Value Ref Range Status  10/30/2022 24 10 - 35 U/L Final         Passed - ALT in normal range and within 360 days    ALT  Date Value Ref Range Status  10/30/2022 13 6 - 29 U/L Final

## 2023-04-09 ENCOUNTER — Other Ambulatory Visit: Payer: Self-pay | Admitting: Family Medicine

## 2023-04-13 ENCOUNTER — Other Ambulatory Visit (HOSPITAL_COMMUNITY): Payer: Self-pay | Admitting: Psychiatry

## 2023-04-18 ENCOUNTER — Other Ambulatory Visit (HOSPITAL_COMMUNITY): Payer: Self-pay | Admitting: Psychiatry

## 2023-04-18 ENCOUNTER — Telehealth (HOSPITAL_COMMUNITY): Payer: Self-pay | Admitting: *Deleted

## 2023-04-18 NOTE — Telephone Encounter (Signed)
Patient called [facility] wouldn't  give her --her medicine -- Lexapro-- Called Rx  & they informed it's to soon patient picked up on the  04/21/23 & Not available until 04/22/23. 3 day earlier pick up os available 04/19/23--PATIENT INFORMED

## 2023-04-23 ENCOUNTER — Other Ambulatory Visit: Payer: Self-pay | Admitting: Family Medicine

## 2023-04-23 MED ORDER — OXYCODONE-ACETAMINOPHEN 7.5-325 MG PO TABS: 1.0000 | ORAL_TABLET | Freq: Four times a day (QID) | ORAL | 0 refills | Status: DC | PRN

## 2023-05-24 ENCOUNTER — Other Ambulatory Visit: Payer: Self-pay | Admitting: Family Medicine

## 2023-05-25 MED ORDER — OXYCODONE-ACETAMINOPHEN 7.5-325 MG PO TABS: 1.0000 | ORAL_TABLET | Freq: Four times a day (QID) | ORAL | 0 refills | Status: DC | PRN

## 2023-06-21 ENCOUNTER — Other Ambulatory Visit: Payer: Self-pay | Admitting: Family Medicine

## 2023-06-21 NOTE — Telephone Encounter (Signed)
Requested Prescriptions  Pending Prescriptions Disp Refills   omeprazole (PRILOSEC) 40 MG capsule [Pharmacy Med Name: OMEPRAZOLE DR 40 MG CAPSULE] 90 capsule 0    Sig: TAKE 1 CAPSULE (40 MG TOTAL) BY MOUTH DAILY.     Gastroenterology: Proton Pump Inhibitors Failed - 06/21/2023  2:39 AM      Failed - Valid encounter within last 12 months    Recent Outpatient Visits           1 year ago Hematuria, unspecified type   [facility] Tanya Nones, Priscille Heidelberg, MD   1 year ago Hematuria, unspecified type   [facility] Tanya Nones Priscille Heidelberg, MD   1 year ago Hematuria, unspecified type   [facility] Valentino Nose, NP   1 year ago Atrial fibrillation with RVR (HCC)   [facility] Pickard, Priscille Heidelberg, MD   1 year ago Rapid heart beat   [facility] Pickard, Priscille Heidelberg, MD

## 2023-06-22 ENCOUNTER — Other Ambulatory Visit: Payer: Self-pay | Admitting: Family Medicine

## 2023-06-22 MED ORDER — OXYCODONE-ACETAMINOPHEN 7.5-325 MG PO TABS: 1.0000 | ORAL_TABLET | Freq: Four times a day (QID) | ORAL | 0 refills | Status: DC | PRN

## 2023-06-28 ENCOUNTER — Other Ambulatory Visit: Payer: Self-pay | Admitting: Cardiology

## 2023-07-04 ENCOUNTER — Other Ambulatory Visit: Payer: Self-pay | Admitting: Family Medicine

## 2023-07-04 NOTE — Telephone Encounter (Signed)
Requested medication (s) are due for refill today: yes  Requested medication (s) are on the active medication list: yes  Last refill:  04/02/23  Future visit scheduled: no  Notes to clinic:  Unable to refill per protocol due to failed labs, no updated results.      Requested Prescriptions  Pending Prescriptions Disp Refills   ELIQUIS 5 MG TABS tablet [Pharmacy Med Name: ELIQUIS 5 MG TABLET] 180 tablet 0    Sig: TAKE 1 TABLET BY MOUTH TWICE A DAY     Hematology:  Anticoagulants - apixaban Failed - 07/04/2023  1:07 PM      Failed - PLT in normal range and within 360 days    Platelets  Date Value Ref Range Status  10/30/2022 409 (H) 140 - 400 Thousand/uL Final         Failed - HGB in normal range and within 360 days    Hemoglobin  Date Value Ref Range Status  10/30/2022 9.6 (L) 11.7 - 15.5 g/dL Final         Failed - HCT in normal range and within 360 days    HCT  Date Value Ref Range Status  10/30/2022 29.1 (L) 35.0 - 45.0 % Final         Failed - Valid encounter within last 12 months    Recent Outpatient Visits           1 year ago Hematuria, unspecified type   [facility] Donita Brooks, MD   1 year ago Hematuria, unspecified type   [facility] Donita Brooks, MD   1 year ago Hematuria, unspecified type   [facility] Valentino Nose, NP   1 year ago Atrial fibrillation with RVR (HCC)   [facility] Donita Brooks, MD   2 years ago Rapid heart beat   [facility] Pickard, Priscille Heidelberg, MD              Passed - Cr in normal range and within 360 days    Creat  Date Value Ref Range Status  10/30/2022 0.84 0.60 - 1.00 mg/dL Final         Passed - AST in normal range and within 360 days    AST  Date Value Ref Range Status  10/30/2022 24 10 - 35 U/L Final         Passed - ALT in normal range and within 360 days    ALT  Date Value Ref Range Status  10/30/2022 13 6  - 29 U/L Final

## 2023-07-11 ENCOUNTER — Other Ambulatory Visit: Payer: Self-pay | Admitting: Family Medicine

## 2023-07-13 ENCOUNTER — Telehealth: Payer: Self-pay

## 2023-07-13 ENCOUNTER — Other Ambulatory Visit: Payer: Self-pay

## 2023-07-13 DIAGNOSIS — I4891 Unspecified atrial fibrillation: Secondary | ICD-10-CM

## 2023-07-13 MED ORDER — APIXABAN 5 MG PO TABS: 5.0000 mg | ORAL_TABLET | Freq: Two times a day (BID) | ORAL | 1 refills | Status: DC

## 2023-07-13 NOTE — Telephone Encounter (Signed)
Pt's husband called in to check on status of refill of this med ELIQUIS 5 MG TABS tablet [[id number]]. Pt's husband stated that pt only has 2 days of this med left. Pt is schedule for a follow up with pcp on 07/17/23 and would like to know if a courtesy refill could be sent in to pharmacy please.  LOV: 10/30/22 cpe  PHARMACY: [id number] - [place], [place] - [address] AT [facility] [address], [place] [address] Phone: [phone]  Fax: [phone]  CB#: [phone]

## 2023-07-17 ENCOUNTER — Ambulatory Visit (HOSPITAL_COMMUNITY)
Admission: RE | Admit: 2023-07-17 | Discharge: 2023-07-17 | Disposition: A | Discharge status: Home / Self Care | Payer: Medicare Other | Source: Ambulatory Visit | Attending: Family Medicine | Admitting: Family Medicine

## 2023-07-17 ENCOUNTER — Ambulatory Visit (INDEPENDENT_AMBULATORY_CARE_PROVIDER_SITE_OTHER): Payer: Medicare Other | Admitting: Family Medicine

## 2023-07-17 VITALS — BP 120/72 | HR 79 | Temp 98.5°F | Ht 65.0 in | Wt 114.8 lb

## 2023-07-17 DIAGNOSIS — D649 Anemia, unspecified: Secondary | ICD-10-CM

## 2023-07-17 DIAGNOSIS — J189 Pneumonia, unspecified organism: Secondary | ICD-10-CM | POA: Insufficient documentation

## 2023-07-17 NOTE — Progress Notes (Signed)
Subjective:    Patient ID: Abigail Wagner, female    DOB: [birth year], 72 y.o.   MRN: [id number]  HPI  Patient is a 72 year old Caucasian female with a history of ovarian cancer stage III as well as anal cancer.  I last saw the patient in February.  In February I performed a CT scan of the lungs to screen for lung cancer.  This showed a possible pneumonia.  She was treated with antibiotics appropriately however she never followed up or repeated the chest x-ray.  Today on exam, she denies any chest pain or shortness of breath or pleurisy or hemoptysis however she has persistent Rales in the left lower lobe.  On her blood work in March she was also found to be anemic with a hemoglobin of 9.6.  She is not currently taking any provide B12 supplement.  She is due to recheck her hemoglobin. Past Medical History:  Diagnosis Date   Allergy    Anal carcinoma (HCC)    Chemo and radiation   Anemia    Anxiety    Dr. Tenny Craw at [place] (psychiatrist).     Blood transfusion without reported diagnosis 2017   Carotid artery disease (HCC)    Chronic kidney disease    DDD (degenerative disc disease), lumbosacral    GERD (gastroesophageal reflux disease)    Headache(784.0)    History of adenomatous polyp of colon 10/07/2015   Tubular adenoma high grade dysplasia   History of herpes genitalis    History of kidney stones    History of suicide attempt 2010   HTN (hypertension)    Hyperlipidemia    IBS (irritable bowel syndrome)    Major depression    OA (osteoarthritis)    Ovarian cancer (HCC)    Stage IIIB papillary ovarian carcinoma  s/p omentectomy and BSO & chemotherapy (completed 12-07-2014)   Paroxysmal atrial fibrillation (HCC)    Prolapse of vaginal vault after hysterectomy 07/20/2015   Rectovaginal fistula 08/05/2015   Seasonal allergies    Sigmoid diverticulosis    Smokers' cough [facility])     Past Surgical History:  Procedure Laterality Date   BIOPSY  10/12/2020   Procedure: BIOPSY;   Surgeon: Dolores Frame, MD;  [facility];  Service: Gastroenterology;;   BREAST ENHANCEMENT SURGERY  1986   BREAST SURGERY  1986   CESAREAN SECTION  1978   COLONOSCOPY N/A 10/07/2015   Procedure: COLONOSCOPY;  Surgeon: Malissa Hippo, MD;  [facility];  Service: Endoscopy;  Laterality: N/A;  7:30   COLONOSCOPY WITH PROPOFOL N/A 10/12/2020   Procedure: COLONOSCOPY WITH PROPOFOL;  Surgeon: Dolores Frame, MD;  [facility];  Service: Gastroenterology;  Laterality: N/A;  9:00   COSMETIC SURGERY  1986   CYSTOSCOPY N/A 02/27/2022   Procedure: CYSTOSCOPY;  Surgeon: Malen Gauze, MD;  Location: [facility];  Service: Urology;  Laterality: N/A;   CYSTOSCOPY WITH FULGERATION N/A 11/03/2021   Procedure: CYSTOSCOPY WITH FULGERATION;  Surgeon: Malen Gauze, MD;  Location: [facility];  Service: Urology;  Laterality: N/A;   ESOPHAGOGASTRODUODENOSCOPY (EGD) WITH PROPOFOL N/A 10/12/2020   Procedure: ESOPHAGOGASTRODUODENOSCOPY (EGD) WITH PROPOFOL;  Surgeon: Dolores Frame, MD;  [facility];  Service: Gastroenterology;  Laterality: N/A;   EXPLORATORY LAPAROTOMY/ OMENTECTOMY/  BILATERAL SALPINGOOPHORECTOMY/  PORT-A-CATH PLACEMENT  07-03-2014   [place]   KNEE ARTHROSCOPY Left 09/22/2004   LAPAROSCOPY N/A 06/02/2014   Procedure: DIAGNOSTIC LAPAROSCOPY, OMENTAL BIOPSY, RIGHT OVARY BIOPSY, LYSIS OF  ADHESIONS;  Surgeon: Claud Kelp, MD;  Location: [facility] OR;  Service: General;  Laterality: N/A;   MUCOSAL ADVANCEMENT FLAP N/A 06/15/2016   Procedure: EXCISION RECTOVAGINAOL FISTULA WITH MUCOSAL ADVANCEMENT FLAP;  Surgeon: Romie Levee, MD;  Location: [facility] [place];  Service: General;  Laterality: N/A;   PLACEMENT OF SETON N/A 09/09/2015   Procedure: PLACEMENT OF SETON;  Surgeon: Romie Levee, MD;  Location: [facility];  Service: General;  Laterality: N/A;   PORT-A-CATH REMOVAL Right 08/17/2014    Procedure: REMOVAL INTRAPERITONEAL CHEMO PORT;  Surgeon: Claud Kelp, MD;  Location: [facility];  Service: General;  Laterality: Right;   PORTACATH PLACEMENT Right 08/17/2014   Procedure:  PLACE NEW PORT A CATH;  Surgeon: Claud Kelp, MD;  Location: [facility];  Service: General;  Laterality: Right;   RECTAL BIOPSY N/A 09/09/2015   Procedure: BIOPSY OF RECTOVAGINAL MASS;  Surgeon: Romie Levee, MD;  Location: [facility] [place];  Service: General;  Laterality: N/A;   TRANSURETHRAL RESECTION OF BLADDER TUMOR N/A 02/27/2022   Procedure: TRANSURETHRAL RESECTION OF BLADDER TUMOR (TURBT);  Surgeon: Malen Gauze, MD;  Location: [facility];  Service: Urology;  Laterality: N/A;   TUBAL LIGATION  YRS AGO   VAGINAL HYSTERECTOMY  1981   fibroids   Current Outpatient Medications on File Prior to Visit  Medication Sig Dispense Refill   amiodarone (PACERONE) 200 MG tablet TAKE 1 TABLET BY MOUTH EVERY DAY 90 tablet 3   apixaban (ELIQUIS) 5 MG TABS tablet Take 1 tablet (5 mg total) by mouth 2 (two) times daily. 180 tablet 1   Aspirin-Salicylamide-Caffeine (BC HEADACHE POWDER PO) Take 1-2 Packages by mouth daily at 12 noon.     b complex vitamins tablet Take 1 tablet by mouth daily.     Cholecalciferol (VITAMIN D-3) 125 MCG (5000 UT) TABS Take 5,000 Units by mouth daily.     clotrimazole (LOTRIMIN) 1 % cream Apply 1 application topically 2 (two) times daily as needed (itching around ankles).     Coenzyme Q10 (CO Q 10) 100 MG CAPS Take 100 mg by mouth daily.     divalproex (DEPAKOTE ER) 500 MG 24 hr tablet Take 1 tablet (500 mg total) by mouth daily. 90 tablet 3   escitalopram (LEXAPRO) 20 MG tablet TAKE 1 TABLET BY MOUTH EVERY DAY 90 tablet 2   estradiol (ESTRACE) 0.1 MG/GM vaginal cream Apply a pea size amount of cream to urethral area of vagina twice weekly 42.5 g 12   Ferrous Gluconate (IRON 27 PO) Take 27 mg by mouth daily.     Homeopathic Products (THERAWORX  RELIEF EX) Apply 1 application. topically daily as needed (joint pain).     Krill Oil 350 MG CAPS Take 350 mg by mouth daily.     LORazepam (ATIVAN) 0.5 MG tablet Take 1 tablet (0.5 mg total) by mouth 3 (three) times daily as needed for anxiety. 90 tablet 3   Multiple Vitamin (MULTIVITAMIN) tablet Take 1 tablet by mouth daily.     Multiple Vitamins-Minerals (HAIR SKIN & NAILS ADVANCED PO) Take 3 tablets by mouth daily.     omeprazole (PRILOSEC) 40 MG capsule TAKE 1 CAPSULE (40 MG TOTAL) BY MOUTH DAILY. 90 capsule 0   oxyCODONE-acetaminophen (PERCOCET) 7.5-325 MG tablet Take 1 tablet by mouth every 6 (six) hours as needed for severe pain. This script should last 30 days.  60 per month Ok to fill after 11/27/22 60 tablet 0   polyethylene  glycol (MIRALAX / GLYCOLAX) 17 g packet Take 17 g by mouth daily. 30 each 1   rosuvastatin (CRESTOR) 20 MG tablet TAKE 1 TABLET BY MOUTH EVERY DAY 90 tablet 3   No current facility-administered medications on file prior to visit.   Allergies  Allergen Reactions   Morphine And Codeine     Delirium with sbo   Social History   Socioeconomic History   Marital status: Married    Spouse name: Aurther Loft   Number of children: 4   Years of education: 12   Highest education level: 12th grade  Occupational History   Occupation: retire    Comment: [employer]  Tobacco Use   Smoking status: Former    Current packs/day: 0.00    Average packs/day: 0.5 packs/day for 15.0 years (7.5 ttl pk-yrs)    Types: Cigarettes    Start date: 02/17/2003    Quit date: 02/16/2018    Years since quitting: 5.4   Smokeless tobacco: Never   Tobacco comments:    half pack a day for 15 yrs  Vaping Use   Vaping status: Never Used  Substance and Sexual Activity   Alcohol use: No   Drug use: No   Sexual activity: Not Currently    Birth control/protection: Surgical    Comment: hyst  Other Topics Concern   Not on file  Social History Narrative   Lives at home with Aurther Loft   Retired [employer]   Married 02/16/1990   Several grandchildren    Social Determinants of Health   Financial Resource Strain: Low Risk  (07/13/2023)   Overall Financial Resource Strain (CARDIA)    Difficulty of Paying Living Expenses: Not hard at all  Food Insecurity: No Food Insecurity (07/13/2023)   Hunger Vital Sign    Worried About Running Out of Food in the Last Year: Never true    Ran Out of Food in the Last Year: Never true  Transportation Needs: No Transportation Needs (07/13/2023)   PRAPARE - Administrator, Civil Service (Medical): No    Lack of Transportation (Non-Medical): No  Physical Activity: Insufficiently Active (07/13/2023)   Exercise Vital Sign    Days of Exercise per Week: 2 days    Minutes of Exercise per Session: 20 min  Stress: No Stress Concern Present (07/13/2023)   [employer] of Occupational Health - Occupational Stress Questionnaire    Feeling of Stress : Not at all  Social Connections: Moderately Integrated (07/13/2023)   Social Connection and Isolation Panel [NHANES]    Frequency of Communication with Friends and Family: Twice a week    Frequency of Social Gatherings with Friends and Family: Once a week    Attends Religious Services: More than 4 times per year    Active Member of [employer] or Organizations: No    Attends Banker Meetings: Never    Marital Status: Married  Catering manager Violence: Not At Risk (02/08/2023)   Humiliation, Afraid, Rape, and Kick questionnaire    Fear of Current or Ex-Partner: No    Emotionally Abused: No    Physically Abused: No    Sexually Abused: No   Family History  Problem Relation Age of Onset   Bipolar disorder Mother    Anxiety disorder Mother    Dementia Mother    Depression Mother    Mental illness Mother    Vision loss Mother    Alzheimer's disease Mother    Alcohol abuse Paternal Uncle  Bipolar disorder Maternal Grandmother    Dementia Maternal Grandmother    Alzheimer's disease  Maternal Grandmother    Alcohol abuse Paternal Uncle    Stroke Brother    Deep vein thrombosis Son    Heart disease Father    Hyperlipidemia Father    Hypertension Father    Stroke Father    Vision loss Father    Atrial fibrillation Sister    Heart disease Brother    Heart disease Brother    ADD / ADHD Neg Hx    Drug abuse Neg Hx    OCD Neg Hx    Paranoid behavior Neg Hx    Schizophrenia Neg Hx    Seizures Neg Hx    Sexual abuse Neg Hx    Physical abuse Neg Hx       Review of Systems     Objective:   Physical Exam Vitals reviewed.  Constitutional:      General: She is not in acute distress.    Appearance: She is not ill-appearing or diaphoretic.  Cardiovascular:     Rate and Rhythm: Normal rate and regular rhythm.     Heart sounds: Normal heart sounds. No murmur heard.    No friction rub. No gallop.  Pulmonary:     Effort: Pulmonary effort is normal. No respiratory distress.     Breath sounds: No stridor. Rhonchi and rales present. No wheezing.    Abdominal:     General: Abdomen is flat. Bowel sounds are normal. There is no distension.     Palpations: Abdomen is soft. There is no mass.     Tenderness: There is no abdominal tenderness.  Musculoskeletal:     Lumbar back: Tenderness present. No bony tenderness. Decreased range of motion.       Back:     Right upper leg: No deformity.     Left upper leg: No deformity.     Right lower leg: No edema.     Left lower leg: No edema.  Neurological:     Mental Status: She is alert.         Assessment & Plan:  Community acquired pneumonia, unspecified laterality - Plan: DG Chest 2 View  Anemia, unspecified type - Plan: CBC with Differential/Platelet, COMPLETE METABOLIC PANEL WITH GFR, Iron, Vitamin B12 Obtain chest x-ray to evaluate for the presence of atelectasis or scar tissue.  If there is still opacity in the left lower lobe, I would recommend repeating a CT scan given her smoking history.  Also follow-up on  her anemia.  Recheck CBC, iron level, and vitamin B12.  If the patient is profoundly anemic and continues to demonstrate an iron deficiency, I would recommend starting currently 325 mg daily.

## 2023-07-18 ENCOUNTER — Other Ambulatory Visit: Payer: Self-pay | Admitting: Family Medicine

## 2023-07-18 LAB — COMPLETE METABOLIC PANEL WITH GFR
AG Ratio: 1.5 (calc) (ref 1.0–2.5)
ALT: 9 U/L (ref 6–29)
AST: 20 U/L (ref 10–35)
Albumin: 4.1 g/dL (ref 3.6–5.1)
Alkaline phosphatase (APISO): 70 U/L (ref 37–153)
BUN/Creatinine Ratio: 28 (calc) — ABNORMAL HIGH (ref 6–22)
BUN: 27 mg/dL — ABNORMAL HIGH (ref 7–25)
CO2: 25 mmol/L (ref 20–32)
Calcium: 9.4 mg/dL (ref 8.6–10.4)
Chloride: 104 mmol/L (ref 98–110)
Creat: 0.95 mg/dL (ref 0.60–1.00)
Globulin: 2.8 g/dL (ref 1.9–3.7)
Glucose, Bld: 75 mg/dL (ref 65–99)
Potassium: 4.5 mmol/L (ref 3.5–5.3)
Sodium: 139 mmol/L (ref 135–146)
Total Bilirubin: 0.2 mg/dL (ref 0.2–1.2)
Total Protein: 6.9 g/dL (ref 6.1–8.1)
eGFR: 64 mL/min/{1.73_m2} (ref >=60)

## 2023-07-18 LAB — VITAMIN B12: Vitamin B-12: >2000 pg/mL — ABNORMAL HIGH (ref 200–1100)

## 2023-07-18 LAB — CBC WITH DIFFERENTIAL/PLATELET
Absolute Lymphocytes: 1118 {cells}/uL (ref 850–3900)
Absolute Monocytes: 697 {cells}/uL (ref 200–950)
Basophils Absolute: 43 {cells}/uL (ref 0–200)
Basophils Relative: 0.5 %
Eosinophils Absolute: 181 {cells}/uL (ref 15–500)
Eosinophils Relative: 2.1 %
HCT: 32.5 % — ABNORMAL LOW (ref 35.0–45.0)
Hemoglobin: 10.4 g/dL — ABNORMAL LOW (ref 11.7–15.5)
MCH: 30.9 pg (ref 27.0–33.0)
MCHC: 32 g/dL (ref 32.0–36.0)
MCV: 96.4 fL (ref 80.0–100.0)
MPV: 10.7 fL (ref 7.5–12.5)
Monocytes Relative: 8.1 %
Neutro Abs: 6562 {cells}/uL (ref 1500–7800)
Neutrophils Relative %: 76.3 %
Platelets: 262 10*3/uL (ref 140–400)
RBC: 3.37 10*6/uL — ABNORMAL LOW (ref 3.80–5.10)
RDW: 13.2 % (ref 11.0–15.0)
Total Lymphocyte: 13 %
WBC: 8.6 10*3/uL (ref 3.8–10.8)

## 2023-07-18 LAB — IRON: Iron: 21 ug/dL — ABNORMAL LOW (ref 45–160)

## 2023-07-20 MED ORDER — OXYCODONE-ACETAMINOPHEN 7.5-325 MG PO TABS: 1.0000 | ORAL_TABLET | Freq: Four times a day (QID) | ORAL | 0 refills | Status: DC | PRN

## 2023-07-23 ENCOUNTER — Ambulatory Visit (HOSPITAL_COMMUNITY)
Admission: RE | Admit: 2023-07-23 | Discharge: 2023-07-23 | Disposition: A | Discharge status: Home / Self Care | Payer: Medicare Other | Source: Ambulatory Visit | Attending: Cardiology | Admitting: Cardiology

## 2023-07-23 DIAGNOSIS — I779 Disorder of arteries and arterioles, unspecified: Secondary | ICD-10-CM | POA: Insufficient documentation

## 2023-08-10 ENCOUNTER — Other Ambulatory Visit: Payer: Self-pay | Admitting: Family Medicine

## 2023-08-10 ENCOUNTER — Other Ambulatory Visit: Payer: Self-pay

## 2023-08-10 DIAGNOSIS — R9389 Abnormal findings on diagnostic imaging of other specified body structures: Secondary | ICD-10-CM

## 2023-08-10 DIAGNOSIS — J189 Pneumonia, unspecified organism: Secondary | ICD-10-CM

## 2023-08-14 ENCOUNTER — Telehealth: Payer: Self-pay

## 2023-08-14 NOTE — Telephone Encounter (Signed)
Copied from CRM [id number]. Topic: General - Other >> Aug 14, 2023  3:38 PM Prudencio Pair wrote: Reason for CRM: Pt's husband, Abigail Wagner, called stating that pt got a call from [facility] about X-rays. Pt is wanting to know if she can move it to [place]. She's having trouble with diverticulitis and doesn't think she can make the road trip. Abigail Wagner states the imaging was done at [facility]. He stated he would like if nurse could give him a call back at [phone] or via mobile number [phone].

## 2023-08-17 ENCOUNTER — Other Ambulatory Visit (HOSPITAL_COMMUNITY): Payer: Self-pay | Admitting: Psychiatry

## 2023-08-17 ENCOUNTER — Other Ambulatory Visit: Payer: Self-pay | Admitting: Family Medicine

## 2023-08-17 NOTE — Telephone Encounter (Signed)
Call for appt

## 2023-08-20 ENCOUNTER — Other Ambulatory Visit: Payer: Self-pay

## 2023-08-20 NOTE — Telephone Encounter (Signed)
Copied from CRM [id number]. Topic: Clinical - Medication Refill >> Aug 20, 2023  3:22 PM Adelina Mings wrote: Most Recent Primary Care Visit:  Provider: Lynnea Ferrier T  Department: [facility]  Visit Type: OFFICE VISIT  Date: 07/17/2023  Medication: oxyCODONE-acetaminophen (PERCOCET) 7.5-325 MG tablet  Has the patient contacted their pharmacy? No (Agent: If no, request that the patient contact the pharmacy for the refill. If patient does not wish to contact the pharmacy document the reason why and proceed with request.) (Agent: If yes, when and what did the pharmacy advise?)  Is this the correct pharmacy for this prescription? Yes If no, delete pharmacy and type the correct one.  This is the patient's preferred pharmacy:  [id number] - [place], [place] - [address] AT [facility] [address] [place] [address] Phone: [phone] Fax: [phone]   Has the prescription been filled recently? No  Is the patient out of the medication? Yes  Has the patient been seen for an appointment in the last year OR does the patient have an upcoming appointment? Yes  Can we respond through MyChart? No  Agent: Please be advised that Rx refills may take up to 3 business days. We ask that you follow-up with your pharmacy.

## 2023-08-20 NOTE — Telephone Encounter (Signed)
Scheduled 10/23/22

## 2023-08-20 NOTE — Telephone Encounter (Signed)
Called pt was in shower man stated that he would have her call back

## 2023-08-21 NOTE — Telephone Encounter (Signed)
Requested medication (s) are due for refill today - yes  Requested medication (s) are on the active medication list -yes  Future visit scheduled -no  Last refill: 07/20/23 #60  Notes to clinic: non delegated Rx  Requested Prescriptions  Pending Prescriptions Disp Refills   oxyCODONE-acetaminophen (PERCOCET) 7.5-325 MG tablet 60 tablet 0    Sig: Take 1 tablet by mouth every 6 (six) hours as needed for severe pain (pain score 7-10). This script should last 30 days.  60 per month Ok to fill after 11/27/22     Not Delegated - Analgesics:  Opioid Agonist Combinations Failed - 08/20/2023  5:56 PM      Failed - This refill cannot be delegated      Failed - Urine Drug Screen completed in last 360 days      Failed - Valid encounter within last 3 months    Recent Outpatient Visits           1 year ago Hematuria, unspecified type   [facility] Donita Brooks, MD   1 year ago Hematuria, unspecified type   [facility] Donita Brooks, MD   1 year ago Hematuria, unspecified type   [facility] Valentino Nose, NP   1 year ago Atrial fibrillation with RVR (HCC)   [facility] Donita Brooks, MD   2 years ago Rapid heart beat   [facility] Pickard, Priscille Heidelberg, MD                 Requested Prescriptions  Pending Prescriptions Disp Refills   oxyCODONE-acetaminophen (PERCOCET) 7.5-325 MG tablet 60 tablet 0    Sig: Take 1 tablet by mouth every 6 (six) hours as needed for severe pain (pain score 7-10). This script should last 30 days.  60 per month Ok to fill after 11/27/22     Not Delegated - Analgesics:  Opioid Agonist Combinations Failed - 08/20/2023  5:56 PM      Failed - This refill cannot be delegated      Failed - Urine Drug Screen completed in last 360 days      Failed - Valid encounter within last 3 months    Recent Outpatient Visits           1 year ago Hematuria, unspecified type    [facility] Donita Brooks, MD   1 year ago Hematuria, unspecified type   [facility] Donita Brooks, MD   1 year ago Hematuria, unspecified type   [facility] Valentino Nose, NP   1 year ago Atrial fibrillation with RVR (HCC)   [facility] Donita Brooks, MD   2 years ago Rapid heart beat   [facility] Pickard, Priscille Heidelberg, MD

## 2023-08-22 ENCOUNTER — Ambulatory Visit (HOSPITAL_COMMUNITY): Payer: Medicare Other

## 2023-08-22 ENCOUNTER — Encounter: Payer: Self-pay | Admitting: Family Medicine

## 2023-08-23 MED ORDER — OXYCODONE-ACETAMINOPHEN 7.5-325 MG PO TABS: 1.0000 | ORAL_TABLET | Freq: Four times a day (QID) | ORAL | 0 refills | Status: DC | PRN

## 2023-08-27 NOTE — Telephone Encounter (Signed)
Medication refilled on 08/23/23 on a separate refill encounter.

## 2023-08-30 ENCOUNTER — Ambulatory Visit (HOSPITAL_COMMUNITY)
Admission: RE | Admit: 2023-08-30 | Discharge: 2023-08-30 | Disposition: A | Discharge status: Home / Self Care | Payer: Medicare Other | Source: Ambulatory Visit | Attending: Family Medicine | Admitting: Family Medicine

## 2023-08-30 DIAGNOSIS — J189 Pneumonia, unspecified organism: Secondary | ICD-10-CM | POA: Insufficient documentation

## 2023-08-30 DIAGNOSIS — R9389 Abnormal findings on diagnostic imaging of other specified body structures: Secondary | ICD-10-CM | POA: Diagnosis present

## 2023-08-30 MED ORDER — IOHEXOL 350 MG/ML SOLN
65.0000 mL | Freq: Once | INTRAVENOUS | Status: AC | PRN
  Administered 2023-08-30: 65 mL via INTRAVENOUS

## 2023-09-03 ENCOUNTER — Telehealth (HOSPITAL_COMMUNITY): Payer: Medicare Other | Admitting: Psychiatry

## 2023-09-03 ENCOUNTER — Encounter (HOSPITAL_COMMUNITY): Payer: Self-pay | Admitting: Psychiatry

## 2023-09-03 DIAGNOSIS — F331 Major depressive disorder, recurrent, moderate: Secondary | ICD-10-CM | POA: Diagnosis not present

## 2023-09-03 DIAGNOSIS — F319 Bipolar disorder, unspecified: Secondary | ICD-10-CM

## 2023-09-03 MED ORDER — LORAZEPAM 0.5 MG PO TABS: 0.5000 mg | ORAL_TABLET | Freq: Three times a day (TID) | ORAL | 3 refills | Status: DC | PRN

## 2023-09-03 MED ORDER — ESCITALOPRAM OXALATE 20 MG PO TABS: 20.0000 mg | ORAL_TABLET | Freq: Every day | ORAL | 2 refills | Status: DC

## 2023-09-03 MED ORDER — DIVALPROEX SODIUM ER 500 MG PO TB24: 500.0000 mg | ORAL_TABLET | Freq: Every day | ORAL | 3 refills | Status: DC

## 2023-09-03 NOTE — Progress Notes (Signed)
Virtual Visit via Telephone Note  I connected with Abigail Wagner on 09/03/23 at  3:00 PM EST by telephone and verified that I am speaking with the correct person using two identifiers.  Location: Patient: home Provider: office   I discussed the limitations, risks, security and privacy concerns of performing an evaluation and management service by telephone and the availability of in person appointments. I also discussed with the patient that there may be a patient responsible charge related to this service. The patient expressed understanding and agreed to proceed.      I discussed the assessment and treatment plan with the patient. The patient was provided an opportunity to ask questions and all were answered. The patient agreed with the plan and demonstrated an understanding of the instructions.   The patient was advised to call back or seek an in-person evaluation if the symptoms worsen or if the condition fails to improve as anticipated.  I provided 20 minutes of non-face-to-face time during this encounter.   Diannia Ruder, MD  [facility] MD/PA/NP OP Progress Note  09/03/2023 3:18 PM Abigail Wagner  MRN:  [id number]  Chief Complaint:  Chief Complaint  Patient presents with   Depression   Anxiety   Follow-up   HPI: This patient is a 72 year old married white female lives with her husband in [place]. She has 3 children and 5 grandchildren. She is retired from [employer].   The patient states she's had depression since her 40s. She was hospitalized several times, last time being in 2010 when she took a drug overdose. She was going through a lot of family stress back then. She states that she was [facility] and since then she has never wanted to go back. She's been quite stable despite her low doses of medication. She's also stopped drinking alcohol which is made a big difference  The patient returns for follow-up after 4 months regarding her depression  anxiety and bipolar disorder.  She is tells me that she has had uncontrolled diarrhea for several months and her husband did some looking online.  He conjectured that she might have celiac disease.  They have cut out bread for a few days and she seems to be doing a lot better with the bowel movements.  She has not had any formal testing so I urged her to talk to GI about this.  She states overall her mood is good as long as she is not dealing with the uncontrolled diarrhea.  She is getting pretty good sleep and denies severe depression but does get easily frustrated with her health issues.  She does think that the Ativan continues to help with her anxiety Visit Diagnosis:    ICD-10-CM   1. Bipolar 1 disorder (HCC)  F31.9     2. Major depressive disorder, recurrent episode, moderate (HCC)  F33.1       Past Psychiatric History: Past hospitalizations for depression and alcohol abuse, none since 2010  Past Medical History:  Past Medical History:  Diagnosis Date   Allergy    Anal carcinoma (HCC)    Chemo and radiation   Anemia    Anxiety    Dr. Tenny Craw at [place] (psychiatrist).     Blood transfusion without reported diagnosis 2017   Carotid artery disease (HCC)    Chronic kidney disease    DDD (degenerative disc disease), lumbosacral    GERD (gastroesophageal reflux disease)    Headache(784.0)    History of adenomatous polyp of  colon 10/07/2015   Tubular adenoma high grade dysplasia   History of herpes genitalis    History of kidney stones    History of suicide attempt 2010   HTN (hypertension)    Hyperlipidemia    IBS (irritable bowel syndrome)    Major depression    OA (osteoarthritis)    Ovarian cancer (HCC)    Stage IIIB papillary ovarian carcinoma  s/p omentectomy and BSO & chemotherapy (completed 12-07-2014)   Paroxysmal atrial fibrillation (HCC)    Prolapse of vaginal vault after hysterectomy 07/20/2015   Rectovaginal fistula 08/05/2015   Seasonal allergies    Sigmoid  diverticulosis    Smokers' cough [facility])     Past Surgical History:  Procedure Laterality Date   BIOPSY  10/12/2020   Procedure: BIOPSY;  Surgeon: Dolores Frame, MD;  [facility];  Service: Gastroenterology;;   BREAST ENHANCEMENT SURGERY  1986   BREAST SURGERY  1986   CESAREAN SECTION  1978   COLONOSCOPY N/A 10/07/2015   Procedure: COLONOSCOPY;  Surgeon: Malissa Hippo, MD;  [facility];  Service: Endoscopy;  Laterality: N/A;  7:30   COLONOSCOPY WITH PROPOFOL N/A 10/12/2020   Procedure: COLONOSCOPY WITH PROPOFOL;  Surgeon: Dolores Frame, MD;  [facility];  Service: Gastroenterology;  Laterality: N/A;  9:00   COSMETIC SURGERY  1986   CYSTOSCOPY N/A 02/27/2022   Procedure: CYSTOSCOPY;  Surgeon: Malen Gauze, MD;  Location: [facility];  Service: Urology;  Laterality: N/A;   CYSTOSCOPY WITH FULGERATION N/A 11/03/2021   Procedure: CYSTOSCOPY WITH FULGERATION;  Surgeon: Malen Gauze, MD;  Location: [facility];  Service: Urology;  Laterality: N/A;   ESOPHAGOGASTRODUODENOSCOPY (EGD) WITH PROPOFOL N/A 10/12/2020   Procedure: ESOPHAGOGASTRODUODENOSCOPY (EGD) WITH PROPOFOL;  Surgeon: Dolores Frame, MD;  [facility];  Service: Gastroenterology;  Laterality: N/A;   EXPLORATORY LAPAROTOMY/ OMENTECTOMY/  BILATERAL SALPINGOOPHORECTOMY/  PORT-A-CATH PLACEMENT  07-03-2014   [place]   KNEE ARTHROSCOPY Left 09/22/2004   LAPAROSCOPY N/A 06/02/2014   Procedure: DIAGNOSTIC LAPAROSCOPY, OMENTAL BIOPSY, RIGHT OVARY BIOPSY, LYSIS OF ADHESIONS;  Surgeon: Claud Kelp, MD;  Location[facility];  Service: General;  Laterality: N/A;   MUCOSAL ADVANCEMENT FLAP N/A 06/15/2016   Procedure: EXCISION RECTOVAGINAOL FISTULA WITH MUCOSAL ADVANCEMENT FLAP;  Surgeon: Romie Levee, MD;  Location: [facility];  Service: General;  Laterality: N/A;   PLACEMENT OF SETON N/A 09/09/2015   Procedure: PLACEMENT OF SETON;  Surgeon:  Romie Levee, MD;  Location: [facility];  Service: General;  Laterality: N/A;   PORT-A-CATH REMOVAL Right 08/17/2014   Procedure: REMOVAL INTRAPERITONEAL CHEMO PORT;  Surgeon: Claud Kelp, MD;  Location: [facility];  Service: General;  Laterality: Right;   PORTACATH PLACEMENT Right 08/17/2014   Procedure:  PLACE NEW PORT A CATH;  Surgeon: Claud Kelp, MD;  Location:  SURGERY CENTER;  Service: General;  Laterality: Right;   RECTAL BIOPSY N/A 09/09/2015   Procedure: BIOPSY OF RECTOVAGINAL MASS;  Surgeon: Romie Levee, MD;  [facility];  Service: General;  Laterality: N/A;   TRANSURETHRAL RESECTION OF BLADDER TUMOR N/A 02/27/2022   Procedure: TRANSURETHRAL RESECTION OF BLADDER TUMOR (TURBT);  Surgeon: Malen Gauze, MD;  Location[facility];  Service: Urology;  Laterality: N/A;   TUBAL LIGATION  YRS AGO   VAGINAL HYSTERECTOMY  1981   fibroids    Family Psychiatric History: See below  Family History:  Family History  Problem Relation  Age of Onset   Bipolar disorder Mother    Anxiety disorder Mother    Dementia Mother    Depression Mother    Mental illness Mother    Vision loss Mother    Alzheimer's disease Mother    Alcohol abuse Paternal Uncle    Bipolar disorder Maternal Grandmother    Dementia Maternal Grandmother    Alzheimer's disease Maternal Grandmother    Alcohol abuse Paternal Uncle    Stroke Brother    Deep vein thrombosis Son    Heart disease Father    Hyperlipidemia Father    Hypertension Father    Stroke Father    Vision loss Father    Atrial fibrillation Sister    Heart disease Brother    Heart disease Brother    ADD / ADHD Neg Hx    Drug abuse Neg Hx    OCD Neg Hx    Paranoid behavior Neg Hx    Schizophrenia Neg Hx    Seizures Neg Hx    Sexual abuse Neg Hx    Physical abuse Neg Hx     Social History:  Social History   Socioeconomic History   Marital status: Married    Spouse  name: Aurther Loft   Number of children: 4   Years of education: 12   Highest education level: 12th grade  Occupational History   Occupation: retire    Comment: [employer]  Tobacco Use   Smoking status: Former    Current packs/day: 0.00    Average packs/day: 0.5 packs/day for 15.0 years (7.5 ttl pk-yrs)    Types: Cigarettes    Start date: 02/17/2003    Quit date: 02/16/2018    Years since quitting: 5.5   Smokeless tobacco: Never   Tobacco comments:    half pack a day for 15 yrs  Vaping Use   Vaping status: Never Used  Substance and Sexual Activity   Alcohol use: No   Drug use: No   Sexual activity: Not Currently    Birth control/protection: Surgical    Comment: hyst  Other Topics Concern   Not on file  Social History Narrative   Lives at home with Aurther Loft   Retired [employer]   Married 02/16/1990   Several grandchildren    Social Determinants of Health   Financial Resource Strain: Low Risk  (07/13/2023)   Overall Financial Resource Strain (CARDIA)    Difficulty of Paying Living Expenses: Not hard at all  Food Insecurity: No Food Insecurity (07/13/2023)   Hunger Vital Sign    Worried About Running Out of Food in the Last Year: Never true    Ran Out of Food in the Last Year: Never true  Transportation Needs: No Transportation Needs (07/13/2023)   PRAPARE - Administrator, Civil Service (Medical): No    Lack of Transportation (Non-Medical): No  Physical Activity: Insufficiently Active (07/13/2023)   Exercise Vital Sign    Days of Exercise per Week: 2 days    Minutes of Exercise per Session: 20 min  Stress: No Stress Concern Present (07/13/2023)   [employer] of Occupational Health - Occupational Stress Questionnaire    Feeling of Stress : Not at all  Social Connections: Moderately Integrated (07/13/2023)   Social Connection and Isolation Panel [NHANES]    Frequency of Communication with Friends and Family: Twice a week    Frequency of Social Gatherings with  Friends and Family: Once a week    Attends Religious Services: More than  4 times per year    Active Member of Clubs or Organizations: No    Attends Banker Meetings: Never    Marital Status: Married    Allergies:  Allergies  Allergen Reactions   Morphine And Codeine     Delirium with sbo    Metabolic Disorder Labs: No results found for: "HGBA1C", "MPG" No results found for: "PROLACTIN" Lab Results  Component Value Date   CHOL 132 10/30/2022   TRIG 97 10/30/2022   HDL 65 10/30/2022   CHOLHDL 2.0 10/30/2022   VLDL 15 06/29/2021   LDLCALC 49 10/30/2022   LDLCALC 74 06/29/2021   Lab Results  Component Value Date   TSH 1.01 10/30/2022   TSH 1.470 06/21/2022    Therapeutic Level Labs: No results found for: "LITHIUM" No results found for: "VALPROATE" No results found for: "CBMZ"  Current Medications: Current Outpatient Medications  Medication Sig Dispense Refill   amiodarone (PACERONE) 200 MG tablet TAKE 1 TABLET BY MOUTH EVERY DAY 90 tablet 3   apixaban (ELIQUIS) 5 MG TABS tablet Take 1 tablet (5 mg total) by mouth 2 (two) times daily. 180 tablet 1   Aspirin-Salicylamide-Caffeine (BC HEADACHE POWDER PO) Take 1-2 Packages by mouth daily at 12 noon.     b complex vitamins tablet Take 1 tablet by mouth daily.     Cholecalciferol (VITAMIN D-3) 125 MCG (5000 UT) TABS Take 5,000 Units by mouth daily.     clotrimazole (LOTRIMIN) 1 % cream Apply 1 application topically 2 (two) times daily as needed (itching around ankles).     Coenzyme Q10 (CO Q 10) 100 MG CAPS Take 100 mg by mouth daily.     divalproex (DEPAKOTE ER) 500 MG 24 hr tablet Take 1 tablet (500 mg total) by mouth daily. 90 tablet 3   escitalopram (LEXAPRO) 20 MG tablet Take 1 tablet (20 mg total) by mouth daily. 90 tablet 2   estradiol (ESTRACE) 0.1 MG/GM vaginal cream Apply a pea size amount of cream to urethral area of vagina twice weekly 42.5 g 12   Ferrous Gluconate (IRON 27 PO) Take 27 mg by mouth  daily.     Homeopathic Products (THERAWORX RELIEF EX) Apply 1 application. topically daily as needed (joint pain).     Krill Oil 350 MG CAPS Take 350 mg by mouth daily.     LORazepam (ATIVAN) 0.5 MG tablet Take 1 tablet (0.5 mg total) by mouth 3 (three) times daily as needed for anxiety. 90 tablet 3   Multiple Vitamin (MULTIVITAMIN) tablet Take 1 tablet by mouth daily.     Multiple Vitamins-Minerals (HAIR SKIN & NAILS ADVANCED PO) Take 3 tablets by mouth daily.     omeprazole (PRILOSEC) 40 MG capsule TAKE 1 CAPSULE (40 MG TOTAL) BY MOUTH DAILY. 90 capsule 0   oxyCODONE-acetaminophen (PERCOCET) 7.5-325 MG tablet Take 1 tablet by mouth every 6 (six) hours as needed for severe pain (pain score 7-10). This script should last 30 days.  60 per month Ok to fill after 11/27/22 60 tablet 0   polyethylene glycol (MIRALAX / GLYCOLAX) 17 g packet Take 17 g by mouth daily. 30 each 1   rosuvastatin (CRESTOR) 20 MG tablet TAKE 1 TABLET BY MOUTH EVERY DAY 90 tablet 3   No current facility-administered medications for this visit.     Musculoskeletal: Strength & Muscle Tone: na Gait & Station: na Patient leans: N/A  Psychiatric Specialty Exam: Review of Systems  Constitutional:  Positive for appetite change.  Gastrointestinal:  Positive for diarrhea.  Psychiatric/Behavioral:  The patient is nervous/anxious.   All other systems reviewed and are negative.   There were no vitals taken for this visit.There is no height or weight on file to calculate BMI.  General Appearance: NA  Eye Contact:  NA  Speech:  Clear and Coherent  Volume:  Normal  Mood:  Anxious and Euthymic  Affect:  NA  Thought Process:  Goal Directed  Orientation:  Full (Time, Place, and Person)  Thought Content: Logical   Suicidal Thoughts:  No  Homicidal Thoughts:  No  Memory:  Immediate;   Good Recent;   Fair Remote;   NA  Judgement:  Fair  Insight:  Shallow  Psychomotor Activity:  Decreased  Concentration:  Concentration: Fair  and Attention Span: Fair  Recall:  [employer] of Knowledge: Fair  Language: Good  Akathisia:  No  Handed:  Right  AIMS (if indicated): not done  Assets:  Communication Skills Desire for Improvement Resilience Social Support  ADL's:  Intact  Cognition: WNL  Sleep:  Good   Screenings: AUDIT    Designer, multimedia from 03/27/2023 in [facility]  Alcohol Use Disorder Identification Test Final Score (AUDIT) 3      GAD-7    Flowsheet Row Clinical Support from 01/10/2021 in [facility]  Total GAD-7 Score 8      PHQ2-9    Flowsheet Row Office Visit from 07/17/2023 in [facility] Clinical Support from 02/08/2023 in [facility] Office Visit from 10/30/2022 in [facility] Office Visit from 07/25/2022 in [facility] Office Visit from 07/04/2022 in [facility]  PHQ-2 Total Score 0 0 0 0 4  PHQ-9 Total Score -- -- -- 0 7      Flowsheet Row ED from 07/12/2022 in [facility] Admission (Discharged) from 02/27/2022 in [facility] Video Visit from 12/21/2021 in [facility] RISK CATEGORY No Risk No Risk No Risk        Assessment and Plan: This patient is a 72 year old female with a history depression anxiety possible bipolar disorder iron deficiency anemia atrial fibrillation chronic pain history of cancer as well as diverticulitis.  For the most part she is doing well with her current regimen in terms of mood.  She will continue Lexapro 20 mg daily for depression, Ativan 0.5 mg 3 times daily for anxiety, Depakote ER 500 mg at bedtime for mood stabilization.  She will return to see me in 3 months  Collaboration of Care: Collaboration of Care: Primary Care Provider AEB notes are shared with PCP on the epic  system  Patient/Guardian was advised Release of Information must be obtained prior to any record release in order to collaborate their care with an outside provider. Patient/Guardian was advised if they have not already done so to contact the registration department to sign all necessary forms in order for [place] to release information regarding their care.   Consent: Patient/Guardian gives verbal consent for treatment and assignment of benefits for services provided during this visit. Patient/Guardian expressed understanding and agreed to proceed.    Diannia Ruder, MD 09/03/2023, 3:18 PM

## 2023-09-11 ENCOUNTER — Ambulatory Visit: Payer: Medicare Other | Admitting: Family Medicine

## 2023-09-11 ENCOUNTER — Ambulatory Visit: Payer: Self-pay | Admitting: Family Medicine

## 2023-09-11 NOTE — Telephone Encounter (Signed)
Copied from CRM [id number]. Topic: Clinical - Red Word Triage >> Sep 11, 2023  7:52 AM Fonda Kinder J wrote: Red Word that prompted transfer to Nurse Triage: Fall: injury to head   Chief Complaint: Diarrhea Symptoms: 2 episodes of watery stools Frequency: 2x last night Pertinent Negatives: Patient denies fever or abd pain Disposition: [] ED /[] Urgent Care (no appt availability in office) / [x] Appointment(In office/virtual)/ []  [place] [facility]/ [] Home Care/ [] Refused Recommended Disposition /[] [facility]/ []  Follow-up with PCP Additional Notes: Patient reports 2 watery stools last night, denies any abd pain or nausea. Pt reports eating some bread last night and possibly triggering the episode.  Pt scheduled for 9am today, wants to reschedule due to unable to control diarrhea at this time. Appt rescheduled for 12/24.    Reason for Disposition  Diarrhea is a chronic symptom (recurrent or ongoing AND present > 4 weeks)  Answer Assessment - Initial Assessment Questions 1. DIARRHEA SEVERITY: "How bad is the diarrhea?" "How many more stools have you had in the past 24 hours than normal?"    - NO DIARRHEA (SCALE 0)   - MILD (SCALE 1-3): Few loose or mushy BMs; increase of 1-3 stools over normal daily number of stools; mild increase in ostomy output.   -  MODERATE (SCALE 4-7): Increase of 4-6 stools daily over normal; moderate increase in ostomy output.   -  SEVERE (SCALE 8-10; OR "WORST POSSIBLE"): Increase of 7 or more stools daily over normal; moderate increase in ostomy output; incontinence.     2 watery episodes last night  2. ONSET: "When did the diarrhea begin?"      Started last night, after possibly eating some bread  3. BM CONSISTENCY: "How loose or watery is the diarrhea?"      Watery diarrhea  4. VOMITING: "Are you also vomiting?" If Yes, ask: "How many times in the past 24 hours?"      No nausea and vomiting  5. ABDOMEN PAIN: "Are you having any abdomen pain?" If  Yes, ask: "What does it feel like?" (e.g., crampy, dull, intermittent, constant)      No abdominal pain  6. ABDOMEN PAIN SEVERITY: If present, ask: "How bad is the pain?"  (e.g., Scale 1-10; mild, moderate, or severe)   - MILD (1-3): doesn't interfere with normal activities, abdomen soft and not tender to touch    - MODERATE (4-7): interferes with normal activities or awakens from sleep, abdomen tender to touch    - SEVERE (8-10): excruciating pain, doubled over, unable to do any normal activities       0-  7. ORAL INTAKE: If vomiting, "Have you been able to drink liquids?" "How much liquids have you had in the past 24 hours?"     Went on a water fast the day before the episode occurred  8. HYDRATION: "Any signs of dehydration?" (e.g., dry mouth [not just dry lips], too weak to stand, dizziness, new weight loss) "When did you last urinate?"     Seems a little weak and lost a lot of weight over the past year, but doesn't seen dehydrated (per husband)  9. EXPOSURE: "Have you traveled to a foreign country recently?" "Have you been exposed to anyone with diarrhea?" "Could you have eaten any food that was spoiled?"     No  10. ANTIBIOTIC USE: "Are you taking antibiotics now or have you taken antibiotics in the past 2 months?"       No antibiotics  11. OTHER  SYMPTOMS: "Do you have any other symptoms?" (e.g., fever, blood in stool)       Bright blood red in stool  Protocols used: Diarrhea-A-AH

## 2023-09-14 ENCOUNTER — Other Ambulatory Visit: Payer: Self-pay

## 2023-09-14 DIAGNOSIS — R918 Other nonspecific abnormal finding of lung field: Secondary | ICD-10-CM

## 2023-09-17 ENCOUNTER — Other Ambulatory Visit: Payer: Self-pay

## 2023-09-17 ENCOUNTER — Telehealth: Payer: Self-pay

## 2023-09-17 MED ORDER — OMEPRAZOLE 40 MG PO CPDR: 40.0000 mg | DELAYED_RELEASE_CAPSULE | Freq: Every day | ORAL | 0 refills | Status: DC

## 2023-09-17 NOTE — Telephone Encounter (Signed)
Prescription Request  09/17/2023  LOV: 07/17/23  What is the name of the medication or equipment? omeprazole (PRILOSEC) 40 MG capsule [[id number]]   Have you contacted your pharmacy to request a refill? Yes   Which pharmacy would you like this sent to?  [id number] - [place], [place] - [address] AT [facility] [address] [place] [address] Phone: [phone] Fax: [phone]    Patient notified that their request is being sent to the clinical staff for review and that they should receive a response within 2 business days.   Please advise at [facility] [phone]

## 2023-09-18 ENCOUNTER — Encounter: Payer: Self-pay | Admitting: Family Medicine

## 2023-09-18 ENCOUNTER — Ambulatory Visit (INDEPENDENT_AMBULATORY_CARE_PROVIDER_SITE_OTHER): Payer: Medicare Other | Admitting: Family Medicine

## 2023-09-18 ENCOUNTER — Ambulatory Visit: Payer: Medicare Other | Admitting: Family Medicine

## 2023-09-18 VITALS — BP 82/60 | HR 62 | Temp 97.6°F | Ht 65.0 in | Wt 105.0 lb

## 2023-09-18 DIAGNOSIS — R0989 Other specified symptoms and signs involving the circulatory and respiratory systems: Secondary | ICD-10-CM

## 2023-09-18 DIAGNOSIS — F32A Depression, unspecified: Secondary | ICD-10-CM

## 2023-09-18 DIAGNOSIS — K529 Noninfective gastroenteritis and colitis, unspecified: Secondary | ICD-10-CM

## 2023-09-18 NOTE — Assessment & Plan Note (Signed)
US ordered

## 2023-09-18 NOTE — Progress Notes (Signed)
Subjective:  HPI: Abigail Wagner is a 72 y.o. female presenting on 09/18/2023 for Follow-up (Colon issues, can't control her bowl, been going on for several months)   HPI Patient is in today with her husband for several years of bowel incontinence and frequent stools. Stools are described as 1-2 times daily, "pasty" and runny, they are black since she is taking iron. She does have PMH of rectal cancer and IBS. They have considered this may be celiacs disease and have tried gluten free diet without improvement. Denies abdominal pain, hematochezia, nausea, vomiting. Has tried Imodium without relief.  Review of Systems  All other systems reviewed and are negative.   Relevant past medical history reviewed and updated as indicated.   Past Medical History:  Diagnosis Date   Allergy    Anal carcinoma (HCC)    Chemo and radiation   Anemia    Anxiety    Dr. Tenny Craw at [place] (psychiatrist).     Blood transfusion without reported diagnosis 2017   Carotid artery disease (HCC)    Chronic kidney disease    DDD (degenerative disc disease), lumbosacral    GERD (gastroesophageal reflux disease)    Headache(784.0)    History of adenomatous polyp of colon 10/07/2015   Tubular adenoma high grade dysplasia   History of herpes genitalis    History of kidney stones    History of suicide attempt 2010   HTN (hypertension)    Hyperlipidemia    IBS (irritable bowel syndrome)    Major depression    OA (osteoarthritis)    Ovarian cancer (HCC)    Stage IIIB papillary ovarian carcinoma  s/p omentectomy and BSO & chemotherapy (completed 12-07-2014)   Paroxysmal atrial fibrillation (HCC)    Prolapse of vaginal vault after hysterectomy 07/20/2015   Rectovaginal fistula 08/05/2015   Seasonal allergies    Sigmoid diverticulosis    Smokers' cough [facility])      Past Surgical History:  Procedure Laterality Date   BIOPSY  10/12/2020   Procedure: BIOPSY;  Surgeon: Dolores Frame, MD;  [facility];  Service: Gastroenterology;;   BREAST ENHANCEMENT SURGERY  1986   BREAST SURGERY  1986   CESAREAN SECTION  1978   COLONOSCOPY N/A 10/07/2015   Procedure: COLONOSCOPY;  Surgeon: Malissa Hippo, MD;  [facility];  Service: Endoscopy;  Laterality: N/A;  7:30   COLONOSCOPY WITH PROPOFOL N/A 10/12/2020   Procedure: COLONOSCOPY WITH PROPOFOL;  Surgeon: Dolores Frame, MD;  [facility];  Service: Gastroenterology;  Laterality: N/A;  9:00   COSMETIC SURGERY  1986   CYSTOSCOPY N/A 02/27/2022   Procedure: CYSTOSCOPY;  Surgeon: Malen Gauze, MD;  Location: [facility];  Service: Urology;  Laterality: N/A;   CYSTOSCOPY WITH FULGERATION N/A 11/03/2021   Procedure: CYSTOSCOPY WITH FULGERATION;  Surgeon: Malen Gauze, MD;  Location: [facility];  Service: Urology;  Laterality: N/A;   ESOPHAGOGASTRODUODENOSCOPY (EGD) WITH PROPOFOL N/A 10/12/2020   Procedure: ESOPHAGOGASTRODUODENOSCOPY (EGD) WITH PROPOFOL;  Surgeon: Dolores Frame, MD;  [facility];  Service: Gastroenterology;  Laterality: N/A;   EXPLORATORY LAPAROTOMY/ OMENTECTOMY/  BILATERAL SALPINGOOPHORECTOMY/  PORT-A-CATH PLACEMENT  07-03-2014   [place]   KNEE ARTHROSCOPY Left 09/22/2004   LAPAROSCOPY N/A 06/02/2014   Procedure: DIAGNOSTIC LAPAROSCOPY, OMENTAL BIOPSY, RIGHT OVARY BIOPSY, LYSIS OF ADHESIONS;  Surgeon: Claud Kelp, MD;  Location: [facility];  Service: General;  Laterality: N/A;   MUCOSAL ADVANCEMENT FLAP N/A 06/15/2016   Procedure: EXCISION  RECTOVAGINAOL FISTULA WITH MUCOSAL ADVANCEMENT FLAP;  Surgeon: Romie Levee, MD;  Location: [facility];  Service: General;  Laterality: N/A;   PLACEMENT OF SETON N/A 09/09/2015   Procedure: PLACEMENT OF SETON;  Surgeon: Romie Levee, MD;  Location: [facility];  Service: General;  Laterality: N/A;   PORT-A-CATH REMOVAL Right 08/17/2014   Procedure: REMOVAL INTRAPERITONEAL CHEMO PORT;  Surgeon:  Claud Kelp, MD;  Location: [facility];  Service: General;  Laterality: Right;   PORTACATH PLACEMENT Right 08/17/2014   Procedure:  PLACE NEW PORT A CATH;  Surgeon: Claud Kelp, MD;  Location: [facility];  Service: General;  Laterality: Right;   RECTAL BIOPSY N/A 09/09/2015   Procedure: BIOPSY OF RECTOVAGINAL MASS;  Surgeon: Romie Levee, MD;  Location: [facility];  Service: General;  Laterality: N/A;   TRANSURETHRAL RESECTION OF BLADDER TUMOR N/A 02/27/2022   Procedure: TRANSURETHRAL RESECTION OF BLADDER TUMOR (TURBT);  Surgeon: Malen Gauze, MD;  Location: [facility];  Service: Urology;  Laterality: N/A;   TUBAL LIGATION  YRS AGO   VAGINAL HYSTERECTOMY  1981   fibroids    Allergies and medications reviewed and updated.   Current Outpatient Medications:    amiodarone (PACERONE) 200 MG tablet, TAKE 1 TABLET BY MOUTH EVERY DAY, Disp: 90 tablet, Rfl: 3   apixaban (ELIQUIS) 5 MG TABS tablet, Take 1 tablet (5 mg total) by mouth 2 (two) times daily., Disp: 180 tablet, Rfl: 1   Aspirin-Salicylamide-Caffeine (BC HEADACHE POWDER PO), Take 1-2 Packages by mouth daily at 12 noon., Disp: , Rfl:    b complex vitamins tablet, Take 1 tablet by mouth daily., Disp: , Rfl:    Cholecalciferol (VITAMIN D-3) 125 MCG (5000 UT) TABS, Take 5,000 Units by mouth daily., Disp: , Rfl:    clotrimazole (LOTRIMIN) 1 % cream, Apply 1 application topically 2 (two) times daily as needed (itching around ankles)., Disp: , Rfl:    Coenzyme Q10 (CO Q 10) 100 MG CAPS, Take 100 mg by mouth daily., Disp: , Rfl:    divalproex (DEPAKOTE ER) 500 MG 24 hr tablet, Take 1 tablet (500 mg total) by mouth daily., Disp: 90 tablet, Rfl: 3   escitalopram (LEXAPRO) 20 MG tablet, Take 1 tablet (20 mg total) by mouth daily., Disp: 90 tablet, Rfl: 2   estradiol (ESTRACE) 0.1 MG/GM vaginal cream, Apply a pea size amount of cream to urethral area of vagina twice weekly, Disp: 42.5 g, Rfl: 12    Ferrous Gluconate (IRON 27 PO), Take 65 mg by mouth daily. Pt is taking 65mg /325 daily, Disp: , Rfl:    Homeopathic Products (THERAWORX RELIEF EX), Apply 1 application. topically daily as needed (joint pain)., Disp: , Rfl:    Krill Oil 350 MG CAPS, Take 350 mg by mouth daily., Disp: , Rfl:    LORazepam (ATIVAN) 0.5 MG tablet, Take 1 tablet (0.5 mg total) by mouth 3 (three) times daily as needed for anxiety., Disp: 90 tablet, Rfl: 3   magnesium 30 MG tablet, Take 30 mg by mouth daily., Disp: , Rfl:    Multiple Vitamin (MULTIVITAMIN) tablet, Take 1 tablet by mouth daily., Disp: , Rfl:    Multiple Vitamins-Minerals (HAIR SKIN & NAILS ADVANCED PO), Take 3 tablets by mouth daily., Disp: , Rfl:    omeprazole (PRILOSEC) 40 MG capsule, Take 1 capsule (40 mg total) by mouth daily., Disp: 90 capsule, Rfl: 0   OVER THE COUNTER MEDICATION, LYSENE 1 daily per husband, Disp: ,  Rfl:    oxyCODONE-acetaminophen (PERCOCET) 7.5-325 MG tablet, Take 1 tablet by mouth every 6 (six) hours as needed for severe pain (pain score 7-10). This script should last 30 days.  60 per month Ok to fill after 11/27/22, Disp: 60 tablet, Rfl: 0   polyethylene glycol (MIRALAX / GLYCOLAX) 17 g packet, Take 17 g by mouth daily., Disp: 30 each, Rfl: 1   rosuvastatin (CRESTOR) 20 MG tablet, TAKE 1 TABLET BY MOUTH EVERY DAY, Disp: 90 tablet, Rfl: 3  Allergies  Allergen Reactions   Morphine And Codeine     Delirium with sbo    Objective:   BP (!) 82/60   Pulse 62   Temp 97.6 F (36.4 C) (Oral)   Ht 5\' 5"  (1.651 m)   Wt 105 lb (47.6 kg)   SpO2 96%   BMI 17.47 kg/m      09/18/2023   11:37 AM 09/18/2023   11:27 AM 07/17/2023   12:23 PM  Vitals with BMI  Height  5\' 5"  5\' 5"   Weight  105 lbs 114 lbs 13 oz  BMI  17.47 19.1  Systolic 82 80 120  Diastolic 60 60 72  Pulse  62 79     Physical Exam Vitals and nursing note reviewed.  Constitutional:      Appearance: Normal appearance. She is normal weight.  HENT:     Head:  Normocephalic and atraumatic.  Cardiovascular:     Rate and Rhythm: Normal rate and regular rhythm.     Pulses: Normal pulses.     Heart sounds: Normal heart sounds.  Pulmonary:     Effort: Pulmonary effort is normal.     Breath sounds: Normal breath sounds.  Abdominal:     General: Abdomen is flat. Bowel sounds are normal. There is abdominal bruit. There is no distension.     Palpations: Abdomen is soft.  Skin:    General: Skin is warm and dry.  Neurological:     General: No focal deficit present.     Mental Status: She is alert and oriented to person, place, and time. Mental status is at baseline.  Psychiatric:        Mood and Affect: Mood normal.        Behavior: Behavior normal.        Thought Content: Thought content normal.        Judgment: Judgment normal.     Assessment & Plan:  Chronic diarrhea Assessment & Plan: Acute on chronic. Will obtain stool studies and refer to gastroenterology. May use Immodium for <2 days consecutively for severe symptoms. Encouraged to push electrolyte fluids. She is returning for routine labs.  Orders: -     Ambulatory referral to Gastroenterology -     Gastrointestinal Pathogen Pnl RT, PCR -     Fecal Globin By Immunochemistry -     Clostridium difficile culture-fecal -     Ova and parasite examination -     Salmonella/Shigella Cult, Campy EIA and Shiga Toxin reflex  Abdominal bruit Assessment & Plan: [place] ordered.  Orders: -     US ABDOMINAL PELVIC ART/VENT FLOW DOPPLER; Future  Depression, unspecified depression type Assessment & Plan: Patient reports her symptoms are stable on current medication regimen, she does see psychiatry and reports having a good support system with her family. Discussed local resources such as behavioral health urgent care and [phone] if she has suicidal ideations. Denies current SI/HI. She will follow up with PCP or Psychiatry if symptoms  worsen.      Follow up plan: Return if symptoms worsen or fail to  improve.  Park Meo, FNP

## 2023-09-18 NOTE — Assessment & Plan Note (Addendum)
Acute on chronic. Will obtain stool studies and refer to gastroenterology. May use Immodium for <2 days consecutively for severe symptoms. Encouraged to push electrolyte fluids. She is returning for routine labs as scheduled, no lab tech available today. Encouraged to push electrolyte fluids.

## 2023-09-18 NOTE — Assessment & Plan Note (Signed)
Patient reports her symptoms are stable on current medication regimen, she does see psychiatry and reports having a good support system with her family. Discussed local resources such as behavioral health urgent care and [phone] if she has suicidal ideations. Denies current SI/HI. She will follow up with PCP or Psychiatry if symptoms worsen.

## 2023-09-20 ENCOUNTER — Other Ambulatory Visit: Payer: Self-pay | Admitting: Family Medicine

## 2023-09-20 MED ORDER — OXYCODONE-ACETAMINOPHEN 7.5-325 MG PO TABS: 1.0000 | ORAL_TABLET | Freq: Four times a day (QID) | ORAL | 0 refills | Status: DC | PRN

## 2023-09-24 ENCOUNTER — Other Ambulatory Visit: Payer: Medicare Other

## 2023-09-24 ENCOUNTER — Encounter (HOSPITAL_COMMUNITY): Payer: Self-pay

## 2023-09-24 ENCOUNTER — Ambulatory Visit (HOSPITAL_COMMUNITY)
Admission: RE | Admit: 2023-09-24 | Discharge: 2023-09-24 | Disposition: A | Discharge status: Home / Self Care | Payer: Medicare Other | Source: Ambulatory Visit | Attending: Family Medicine | Admitting: Family Medicine

## 2023-09-24 ENCOUNTER — Other Ambulatory Visit: Payer: Self-pay | Admitting: Family Medicine

## 2023-09-24 ENCOUNTER — Encounter (INDEPENDENT_AMBULATORY_CARE_PROVIDER_SITE_OTHER): Payer: Self-pay | Admitting: *Deleted

## 2023-09-24 ENCOUNTER — Encounter: Payer: Self-pay | Admitting: Family Medicine

## 2023-09-24 DIAGNOSIS — Z8619 Personal history of other infectious and parasitic diseases: Secondary | ICD-10-CM | POA: Diagnosis present

## 2023-09-24 DIAGNOSIS — I700 Atherosclerosis of aorta: Secondary | ICD-10-CM | POA: Insufficient documentation

## 2023-09-24 DIAGNOSIS — R0989 Other specified symptoms and signs involving the circulatory and respiratory systems: Secondary | ICD-10-CM | POA: Diagnosis present

## 2023-09-24 DIAGNOSIS — I723 Aneurysm of iliac artery: Secondary | ICD-10-CM | POA: Insufficient documentation

## 2023-09-24 DIAGNOSIS — D649 Anemia, unspecified: Secondary | ICD-10-CM

## 2023-09-24 MED ORDER — OXYCODONE-ACETAMINOPHEN 7.5-325 MG PO TABS: 1.0000 | ORAL_TABLET | Freq: Four times a day (QID) | ORAL | 0 refills | Status: DC | PRN

## 2023-09-25 ENCOUNTER — Telehealth: Payer: Self-pay | Admitting: Family Medicine

## 2023-09-25 LAB — IRON,TIBC AND FERRITIN PANEL
%SAT: 19 % (ref 16–45)
Ferritin: 49 ng/mL (ref 16–288)
Iron: 61 ug/dL (ref 45–160)
TIBC: 313 ug/dL (ref 250–450)

## 2023-09-25 NOTE — Telephone Encounter (Signed)
Called to discuss aorta US results. Home BP readings since OV have been 139/58 and 148/63. Encouraged to return to office for CBC and CMP. She is seeing gastroenterology next week. Reinforced importance of pushing electrolyte fluids.

## 2023-09-25 NOTE — Addendum Note (Signed)
Addended by: Park Meo on: 09/25/2023 02:41 PM   Modules accepted: Orders

## 2023-09-28 ENCOUNTER — Other Ambulatory Visit: Payer: Medicare Other

## 2023-09-28 DIAGNOSIS — I10 Essential (primary) hypertension: Secondary | ICD-10-CM

## 2023-09-28 DIAGNOSIS — K529 Noninfective gastroenteritis and colitis, unspecified: Secondary | ICD-10-CM

## 2023-09-29 LAB — COMPLETE METABOLIC PANEL WITH GFR
AG Ratio: 1.3 (calc) (ref 1.0–2.5)
ALT: 13 U/L (ref 6–29)
AST: 21 U/L (ref 10–35)
Albumin: 4 g/dL (ref 3.6–5.1)
Alkaline phosphatase (APISO): 93 U/L (ref 37–153)
BUN: 13 mg/dL (ref 7–25)
CO2: 26 mmol/L (ref 20–32)
Calcium: 9.3 mg/dL (ref 8.6–10.4)
Chloride: 102 mmol/L (ref 98–110)
Creat: 0.88 mg/dL (ref 0.60–1.00)
Globulin: 3 g/dL (ref 1.9–3.7)
Glucose, Bld: 93 mg/dL (ref 65–99)
Potassium: 3.7 mmol/L (ref 3.5–5.3)
Sodium: 139 mmol/L (ref 135–146)
Total Bilirubin: 0.3 mg/dL (ref 0.2–1.2)
Total Protein: 7 g/dL (ref 6.1–8.1)
eGFR: 70 mL/min/{1.73_m2} (ref >=60)

## 2023-10-04 ENCOUNTER — Encounter (INDEPENDENT_AMBULATORY_CARE_PROVIDER_SITE_OTHER): Payer: Self-pay | Admitting: Gastroenterology

## 2023-10-04 ENCOUNTER — Ambulatory Visit (INDEPENDENT_AMBULATORY_CARE_PROVIDER_SITE_OTHER): Payer: Medicare Other | Admitting: Gastroenterology

## 2023-10-04 VITALS — BP 131/68 | HR 75 | Temp 97.7°F | Ht 65.0 in | Wt 103.8 lb

## 2023-10-04 DIAGNOSIS — K529 Noninfective gastroenteritis and colitis, unspecified: Secondary | ICD-10-CM | POA: Diagnosis not present

## 2023-10-04 MED ORDER — COLESTIPOL HCL 1 G PO TABS: 2.0000 g | ORAL_TABLET | Freq: Every day | ORAL | 1 refills | Status: DC

## 2023-10-04 NOTE — Patient Instructions (Addendum)
 Perform blood and stool workup Start colestipol 2 g qday - 4 hours apart from other medications Stop magnesium

## 2023-10-04 NOTE — Progress Notes (Signed)
 Toribio Fortune, M.D. Gastroenterology & Hepatology [facility] [address] [place], [place] [address]  Primary Care Physician: Duanne Butler DASEN, MD [address] [place] [address]  I will communicate my assessment and recommendations to the referring MD via EMR.  Problems: Chronic diarrhea Hypotonic rectal sphincter  History of Present Illness: Abigail Wagner is a 73 y.o. female with PMH squamous cell carcinoma of the anal area s/p CRTx and excision with flap placement, ovarian cancer s/p BSO and CTX, depression,  history of SBO, who presents for follow up of chronic diarrhea.  The patient was last seen on 10/06/2020. At that time, the patient was scheduled for EGD and colonoscopy due to history of recurrent nausea and small bowel obstruction episodes.  Patient was advised to decrease opiate intake and to take Imodium  as needed for diarrhea.  Findings described below.  Patient and husband in the office. She has presented recurrent issues with large to normal size loose bowel  movements for the last year. States that she has 3-4 bowel movements per day. She has presented issues with incontinence or fecal urgency. States that many times after having food she has to have a bowel movements. She has also presented some fecal soiling at night.  Stools have been somewhat darker due to iron , and had a few episodes of blood due to hemorrhoids recently. Very rarely has some diffuse abdominal pain. She has tried tiopectate and Imodium  3 soft gels 1-2 times a week.  Takes magnesium  for unknown reasons.  The patient denies having any vomiting, fever, chills, hematochezia, melena, hematemesis, abdominal distention, abdominal pain, jaundice, pruritus . She has had some weight loss as she has not had too much appetite - also afraid she will have diarrhea with meals. Has lsot close to 20 lb since all started.  Most recent CT of the abdomen and pelvis  with contrast was performed on 12/14/2022 which showed severe left hydronephrosis and hydroureter, intra and extrapolated biliary ductal dilation with duct measuring 1.2 cm with adequate tapering.  Most recent labs from 09/28/2023 showed normal CMP.  Most recent TSH from 10/30/2022 was 1.01.  Last EGD: 10/12/2020, changessuspicious for short- segment Barrett' s esophagus, classified as Barrett' s stage C0- M2 per Prague criteria. Biopsied but biopsies came back negative for Barrett's. - Gastric diverticulum. This could be seen in patients with delayed gastric emptying. - Normal examined duodenum. Biopsied, normal small bowel  Last Colonoscopy: 10/12/2020 diverticulosis in the sigmoid colon, tortuous colon.  Random biopsies were taken.  Normal biopsies.Decreased rectal tone.  Past Medical History: Past Medical History:  Diagnosis Date   Allergy    Anal carcinoma (HCC)    Chemo and radiation   Anemia    Anxiety    Dr. Okey at [place] (psychiatrist).     Blood transfusion without reported diagnosis 2017   Carotid artery disease (HCC)    Chronic kidney disease    DDD (degenerative disc disease), lumbosacral    GERD (gastroesophageal reflux disease)    Headache(784.0)    History of adenomatous polyp of colon 10/07/2015   Tubular adenoma high grade dysplasia   History of herpes genitalis    History of kidney stones    History of suicide attempt 2010   HTN (hypertension)    Hyperlipidemia    IBS (irritable bowel syndrome)    Major depression    OA (osteoarthritis)    Ovarian cancer (HCC)    Stage IIIB papillary ovarian  carcinoma  s/p omentectomy and BSO & chemotherapy (completed 12-07-2014)   Paroxysmal atrial fibrillation (HCC)    Prolapse of vaginal vault after hysterectomy 07/20/2015   Rectovaginal fistula 08/05/2015   Seasonal allergies    Sigmoid diverticulosis    Smokers' cough [facility])     Past Surgical History: Past Surgical History:  Procedure Laterality Date   BIOPSY   10/12/2020   Procedure: BIOPSY;  Surgeon: Eartha Angelia Sieving, MD;  [facility];  Service: Gastroenterology;;   BREAST ENHANCEMENT SURGERY  1986   BREAST SURGERY  1986   CESAREAN SECTION  1978   COLONOSCOPY N/A 10/07/2015   Procedure: COLONOSCOPY;  Surgeon: Claudis RAYMOND Rivet, MD;  [facility]: [facility];  Service: Endoscopy;  Laterality: N/A;  7:30   COLONOSCOPY WITH PROPOFOL  N/A 10/12/2020   Procedure: COLONOSCOPY WITH PROPOFOL ;  Surgeon: Eartha Angelia Sieving, MD;  [facility];  Service: Gastroenterology;  Laterality: N/A;  9:00   COSMETIC SURGERY  1986   CYSTOSCOPY N/A 02/27/2022   Procedure: CYSTOSCOPY;  Surgeon: Sherrilee Belvie CROME, MD;  Location: [facility];  Service: Urology;  Laterality: N/A;   CYSTOSCOPY WITH FULGERATION N/A 11/03/2021   Procedure: CYSTOSCOPY WITH FULGERATION;  Surgeon: Sherrilee Belvie CROME, MD;  Location: [facility];  Service: Urology;  Laterality: N/A;   ESOPHAGOGASTRODUODENOSCOPY (EGD) WITH PROPOFOL  N/A 10/12/2020   Procedure: ESOPHAGOGASTRODUODENOSCOPY (EGD) WITH PROPOFOL ;  Surgeon: Eartha Angelia Sieving, MD;  [facility];  Service: Gastroenterology;  Laterality: N/A;   EXPLORATORY LAPAROTOMY/ OMENTECTOMY/  BILATERAL SALPINGOOPHORECTOMY/  PORT-A-CATH PLACEMENT  07-03-2014   [place]   KNEE ARTHROSCOPY Left 09/22/2004   LAPAROSCOPY N/A 06/02/2014   Procedure: DIAGNOSTIC LAPAROSCOPY, OMENTAL BIOPSY, RIGHT OVARY BIOPSY, LYSIS OF ADHESIONS;  Surgeon: Elon Pacini, MD;  Location: [facility];  Service: General;  Laterality: N/A;   MUCOSAL ADVANCEMENT FLAP N/A 06/15/2016   Procedure: EXCISION RECTOVAGINAOL FISTULA WITH MUCOSAL ADVANCEMENT FLAP;  Surgeon: Bernarda Ned, MD;  Location: [facility];  Service: General;  Laterality: N/A;   PLACEMENT OF SETON N/A 09/09/2015   Procedure: PLACEMENT OF SETON;  Surgeon: Bernarda Ned, MD;  Location: [facility];  Service: General;  Laterality: N/A;   PORT-A-CATH  REMOVAL Right 08/17/2014   Procedure: REMOVAL INTRAPERITONEAL CHEMO PORT;  Surgeon: Elon Pacini, MD;  Location: [facility];  Service: General;  Laterality: Right;   PORTACATH PLACEMENT Right 08/17/2014   Procedure:  PLACE NEW PORT A CATH;  Surgeon: Elon Pacini, MD;  Location: [facility];  Service: General;  Laterality: Right;   RECTAL BIOPSY N/A 09/09/2015   Procedure: BIOPSY OF RECTOVAGINAL MASS;  Surgeon: Bernarda Ned, MD;  Location: [facility];  Service: General;  Laterality: N/A;   TRANSURETHRAL RESECTION OF BLADDER TUMOR N/A 02/27/2022   Procedure: TRANSURETHRAL RESECTION OF BLADDER TUMOR (TURBT);  Surgeon: Sherrilee Belvie CROME, MD;  Location: [facility];  Service: Urology;  Laterality: N/A;   TUBAL LIGATION  YRS AGO   VAGINAL HYSTERECTOMY  1981   fibroids    Family History: Family History  Problem Relation Age of Onset   Bipolar disorder Mother    Anxiety disorder Mother    Dementia Mother    Depression Mother    Mental illness Mother    Vision loss Mother    Alzheimer's disease Mother    Alcohol abuse Paternal Uncle    Bipolar disorder Maternal Grandmother    Dementia Maternal Grandmother    Alzheimer's disease Maternal Grandmother  Alcohol abuse Paternal Uncle    Stroke Brother    Deep vein thrombosis Son    Heart disease Father    Hyperlipidemia Father    Hypertension Father    Stroke Father    Vision loss Father    Atrial fibrillation Sister    Heart disease Brother    Heart disease Brother    ADD / ADHD Neg Hx    Drug abuse Neg Hx    OCD Neg Hx    Paranoid behavior Neg Hx    Schizophrenia Neg Hx    Seizures Neg Hx    Sexual abuse Neg Hx    Physical abuse Neg Hx     Social History: Social History   Tobacco Use  Smoking Status Former   Current packs/day: 0.00   Average packs/day: 0.5 packs/day for 15.0 years (7.5 ttl pk-yrs)   Types: Cigarettes   Start date: 02/17/2003   Quit date: 02/16/2018   Years  since quitting: 5.6  Smokeless Tobacco Never  Tobacco Comments   half pack a day for 15 yrs   Social History   Substance and Sexual Activity  Alcohol Use No   Social History   Substance and Sexual Activity  Drug Use No    Allergies: Allergies  Allergen Reactions   Morphine  And Codeine     Delirium with sbo    Medications: Current Outpatient Medications  Medication Sig Dispense Refill   amiodarone  (PACERONE ) 200 MG tablet TAKE 1 TABLET BY MOUTH EVERY DAY 90 tablet 3   apixaban  (ELIQUIS ) 5 MG TABS tablet Take 1 tablet (5 mg total) by mouth 2 (two) times daily. 180 tablet 1   Aspirin-Salicylamide-Caffeine (BC HEADACHE POWDER PO) Take 1-2 Packages by mouth daily at 12 noon.     Cholecalciferol (VITAMIN D -3) 125 MCG (5000 UT) TABS Take 5,000 Units by mouth daily.     clotrimazole (LOTRIMIN) 1 % cream Apply 1 application topically 2 (two) times daily as needed (itching around ankles).     Coenzyme Q10 (CO Q 10) 100 MG CAPS Take 100 mg by mouth daily.     divalproex  (DEPAKOTE  ER) 500 MG 24 hr tablet Take 1 tablet (500 mg total) by mouth daily. 90 tablet 3   escitalopram  (LEXAPRO ) 20 MG tablet Take 1 tablet (20 mg total) by mouth daily. 90 tablet 2   estradiol  (ESTRACE ) 0.1 MG/GM vaginal cream Apply a pea size amount of cream to urethral area of vagina twice weekly 42.5 g 12   Ferrous Gluconate (IRON  27 PO) Take 65 mg by mouth daily. Pt is taking 65mg /325 daily     Homeopathic Products (THERAWORX RELIEF EX) Apply 1 application. topically daily as needed (joint pain).     Krill Oil 350 MG CAPS Take 350 mg by mouth daily.     LORazepam  (ATIVAN ) 0.5 MG tablet Take 1 tablet (0.5 mg total) by mouth 3 (three) times daily as needed for anxiety. 90 tablet 3   magnesium  30 MG tablet Take 30 mg by mouth daily.     Multiple Vitamin (MULTIVITAMIN) tablet Take 1 tablet by mouth daily.     Multiple Vitamins-Minerals (HAIR SKIN & NAILS ADVANCED PO) Take 3 tablets by mouth daily.     omeprazole   (PRILOSEC) 40 MG capsule Take 1 capsule (40 mg total) by mouth daily. 90 capsule 0   OVER THE COUNTER MEDICATION LYSENE 1 daily per husband     oxyCODONE -acetaminophen  (PERCOCET) 7.5-325 MG tablet Take 1 tablet by mouth every 6 (six)  hours as needed for severe pain (pain score 7-10). This script should last 30 days.  60 per month 60 tablet 0   rosuvastatin  (CRESTOR ) 20 MG tablet TAKE 1 TABLET BY MOUTH EVERY DAY 90 tablet 3   polyethylene glycol (MIRALAX  / GLYCOLAX ) 17 g packet Take 17 g by mouth daily. (Patient not taking: Reported on 10/04/2023) 30 each 1   No current facility-administered medications for this visit.    Review of Systems: GENERAL: negative for malaise, night sweats HEENT: No changes in hearing or vision, no nose bleeds or other nasal problems. NECK: Negative for lumps, goiter, pain and significant neck swelling RESPIRATORY: Negative for cough, wheezing CARDIOVASCULAR: Negative for chest pain, leg swelling, palpitations, orthopnea GI: SEE HPI MUSCULOSKELETAL: Negative for joint pain or swelling, back pain, and muscle pain. SKIN: Negative for lesions, rash PSYCH: Negative for sleep disturbance, mood disorder and recent psychosocial stressors. HEMATOLOGY Negative for prolonged bleeding, bruising easily, and swollen nodes. ENDOCRINE: Negative for cold or heat intolerance, polyuria, polydipsia and goiter. NEURO: negative for tremor, gait imbalance, syncope and seizures. The remainder of the review of systems is noncontributory.   Physical Exam: BP 131/68 (BP Location: Right Arm, Patient Position: Sitting, Cuff Size: Normal)   Pulse 75   Temp 97.7 F (36.5 C) (Temporal)   Ht 5' 5 (1.651 m)   Wt 103 lb 12.8 oz (47.1 kg)   BMI 17.27 kg/m  GENERAL: The patient is AO x3, in no acute distress.  Underweight. HEENT: Head is normocephalic and atraumatic. EOMI are intact. Mouth is well hydrated and without lesions. NECK: Supple. No masses LUNGS: Clear to auscultation. No  presence of rhonchi/wheezing/rales. Adequate chest expansion HEART: RRR, normal s1 and s2. ABDOMEN: Soft, nontender, no guarding, no peritoneal signs, and nondistended. BS +. No masses.  EXTREMITIES: Without any cyanosis, clubbing, rash, lesions or edema. NEUROLOGIC: AOx3, no focal motor deficit. SKIN: no jaundice, no rashes  Imaging/Labs: as above  I personally reviewed and interpreted the available labs, imaging and endoscopic files.  Impression and Plan: AKEILA LANA is a 73 y.o. female with PMH squamous cell carcinoma of the anal area s/p CRTx and excision with flap placement, ovarian cancer s/p BSO and CTX, depression,  history of SBO, who presents for follow up of chronic diarrhea.  Patient has presented fluctuation in the bowel movement consistency for the last couple of years.  Had endoscopic evaluation in the past that was unremarkable with negative biopsies for microscopic colitis.  However, she reports having frequent loose bowel movement during the day, with some episodes of nighttime soiling.  The fact that she also has hypotonic anal sphincter after her previous anal cancer treatment, makes her symptoms worse.  We will evaluate gastrointestinal infections and EPI with stool testing, will also check for celiac disease with serology.  For now, she will be started on colestipol  2 g every day to increase the bulk of the stool.  Other potential contributor to her symptoms is the intake of medications that could lead to change in her bowel movement consistency.  I advised her to stop taking magnesium  at this moment.  -Celiac disease panel, GI pathogen panel, C. difficile, fecal fat and fecal elastase -Start colestipol  2 g qday - 4 hours apart from other medications -Stop magnesium   All questions were answered.      Toribio Fortune, MD Gastroenterology and Hepatology [facility]

## 2023-10-11 LAB — CELIAC DISEASE PANEL
(tTG) Ab, IgA: <1 U/mL
(tTG) Ab, IgG: <1 U/mL
Gliadin IgA: <1 U/mL
Gliadin IgG: <1 U/mL
Immunoglobulin A: 288 mg/dL (ref 70–320)

## 2023-10-12 ENCOUNTER — Encounter (INDEPENDENT_AMBULATORY_CARE_PROVIDER_SITE_OTHER): Payer: Self-pay

## 2023-10-18 LAB — GASTROINTESTINAL PATHOGEN PNL
CampyloBacter Group: NOT DETECTED
Norovirus GI/GII: NOT DETECTED
Rotavirus A: NOT DETECTED
Salmonella species: NOT DETECTED
Shiga Toxin 1: NOT DETECTED
Shiga Toxin 2: NOT DETECTED
Shigella Species: NOT DETECTED
Vibrio Group: NOT DETECTED
Yersinia enterocolitica: NOT DETECTED

## 2023-10-18 LAB — C. DIFFICILE GDH AND TOXIN A/B
GDH ANTIGEN: NOT DETECTED
MICRO NUMBER:: 15972213
SPECIMEN QUALITY:: ADEQUATE
TOXIN A AND B: NOT DETECTED

## 2023-10-18 LAB — PANCREATIC ELASTASE, FECAL: Pancreatic Elastase-1, Stool: 107 ug/g — ABNORMAL LOW (ref >200)

## 2023-10-18 LAB — FECAL FAT, QUALITATIVE: FECAL FAT, QUALITATIVE: NORMAL

## 2023-10-23 ENCOUNTER — Other Ambulatory Visit (INDEPENDENT_AMBULATORY_CARE_PROVIDER_SITE_OTHER): Payer: Self-pay | Admitting: Gastroenterology

## 2023-10-23 DIAGNOSIS — K8681 Exocrine pancreatic insufficiency: Secondary | ICD-10-CM

## 2023-10-23 MED ORDER — PANCRELIPASE (LIP-PROT-AMYL) 36000-114000 UNITS PO CPEP: 72000.0000 [IU] | ORAL_CAPSULE | Freq: Three times a day (TID) | ORAL | 1 refills | Status: DC

## 2023-10-25 ENCOUNTER — Other Ambulatory Visit: Payer: Self-pay | Admitting: Family Medicine

## 2023-10-26 MED ORDER — OXYCODONE-ACETAMINOPHEN 7.5-325 MG PO TABS: 1.0000 | ORAL_TABLET | Freq: Four times a day (QID) | ORAL | 0 refills | Status: DC | PRN

## 2023-10-30 NOTE — Progress Notes (Signed)
 Abigail Wagner, female    DOB: 1950-11-16    MRN: [id number]   Brief patient profile:  53  yowf  active smoker  referred to pulmonary clinic in [place]  10/31/2023 by Dr Duanne with persistent AS Disease   since march 2024 in setting of ovarian Ca s/p resection [facility] and then chemo in [place] and and radiation at [facility]   Last oncololgy note  PRIOR THERAPY:  1. Debulking surgery with BSO on 07/04/2014. 2. Chemoradiation therapy with 5-FU and mitomycin  completed on 12/06/2015.   NGS Results: Not done   CURRENT THERAPY: Surveillance   BRIEF ONCOLOGIC HISTORY:     Oncology History  History of ovarian cancer  06/02/2014 Procedure    Diagnostic laparoscopy, lysis of adhesions, biopsy of omental nodules, biopsy right ovary by Dr Gail    06/04/2014 Pathology Results    Diagnosis 1. Omentum, biopsy, Anterior peritoneal & omental nodule - SEROUS CARCINOMA WITH ASSOCIATED ABUNDANT PSAMMOMA BODIES AND DESMOPLASTIC STROMAL REACTION CONSISTENT WITH INVASIVE IMPLANTS. - SEE COMMENT. 2. Omentum, biopsy, Omental nodule - SEROUS CARCINOMA WITH ASSOCIATED ABUNDANT PSAMMOMA BODIES AND DESMOPLASTIC STROMAL REACTION CONSISTENT WITH INVASIVE IMPLANTS. - SEE COMMENT. 3. Ovary, biopsy/wedge resection, Right ovary - SMALL FRAGMENTS OF ATYPICAL PAPILLARY PROLIFERATION WITH PSAMMOMA BODIES CONSISTENT WITH AT LEAST SEROUS BORDERLINE TUMOR.    07/03/2014 Procedure    Exploratory laparotomy, omentectomy, Bilateral salpingo-oophorectomy, inptraperitoneal PORT placement by Dr. Dodie            07/24/2014 - 12/07/2014 Chemotherapy    Carboplatin/Paclitaxel x 6 cycles      08/17/2014 Procedure    Insertion of 8 French power port clear view tunneled venous vascular access device next line use of fluoroscopy for guidance and positioning Use of ultrasound for venipuncture Removal of intraperitoneal chemotherapy port By Dr. Gail    05/14/2017 Genetic Testing    Negative genetic testing on the [facility]  panel.  The [facility] gene panel offered by [employer] includes sequencing and deletion/duplication testing of the following 28 genes: APC, ATM, BARD1, BMPR1A, BRCA1, BRCA2, BRIP1, CHD1, CDK4, CDKN2A, CHEK2, EPCAM (large rearrangement only), MLH1, MSH2, MSH6, MUTYH, NBN, PALB2, PMS2, PTEN, RAD51C, RAD51D, SMAD4, STK11, and TP53. Sequencing was performed for select regions of POLE and POLD1, and large rearrangement analysis was performed for select regions of GREM1. The report date is April 27, 2017.   HRD testing was performed on the ovarian tumor.  The tumor was negative for any deleterious BRCA1 or BRCA2 mutations.    Genomic instability status was not interpretable.  Therefore, [facility] was unable to analyze the ovarian tumor for genomic instability.  The report date is May 14, 2017.      Anal cancer (HCC)  09/09/2015 Procedure    Rectovaginal septal mass biopsy by Dr. Debby    09/10/2015 Pathology Results    Soft tissue mass, biopsy, rectovaginal septal mass - SQUAMOUS CELL CARCINOMA.    10/15/2015 PET scan    1. Focal hypermetabolism with associated ill-defined soft tissue fullness at the junction of the anterior anal wall and posterior inferior vaginal wall, in keeping with the provided history of a primary anal carcinoma. 2. No hypermetabolic locoregional or distant metastatic disease. 3. Status post hysterectomy, with no abnormal findings at the vaginal cuff. No evidence of hypermetabolic peritoneal tumor. 4. Tiny nonobstructing bilateral renal stones.    10/25/2015 - 12/06/2015 Radiation Therapy    [place]    10/25/2015 - 12/06/2015 Chemotherapy    Mitomycin  C and 5FU,  by Dr. Lyndol      06/15/2016 Procedure    EXCISION RECTOVAGINAOL FISTULA WITH MUCOSAL ADVANCEMENT FLAP by Dr. Debby    06/18/2016 Pathology Results    Fistula, rectovaginal SQUAMOUS, COLUMNAR JUNCTION MUCOSA AND SUBCUTANOUS SOFT TISSUE WITH FISTULA NO EVIDENCE OF MALIGNANCY     09/20/2016 Imaging    CT CAP- 1. No acute findings and no evidence for recurrent tumor or metastatic disease. 2. Aortic atherosclerosis and coronary artery calcification        CANCER STAGING: Cancer Staging Anal cancer (HCC) Staging form: Anus, AJCC 8th Edition - Clinical: Stage IIA (cT2, cN0, cM0) - Signed by Berry Debby RAMAN, PA-C on 09/27/2016      History of Present Illness  10/31/2023  Pulmonary/ 1st office eval/ Abigail Wagner / [place] Office  Chief Complaint  Patient presents with   Consult  Dyspnea:  more dizzy than sob when walking no longer shopping  Cough: yellowish minimal in am/ never hemoptysis  Sleep: bed is flat / L side down always one pillow  SABA use: none 02: none     No obvious day to day or daytime pattern/variability or assoc  or mucus plugs or hemoptysis or cp or chest tightness, subjective wheeze or overt sinus or hb symptoms.    Also denies any obvious fluctuation of symptoms with weather or environmental changes or other aggravating or alleviating factors except as outlined above   No unusual exposure hx or h/o childhood pna/ asthma or knowledge of premature birth.  Current Allergies, Complete Past Medical History, Past Surgical History, Family History, and Social History were reviewed in [employer] record.  ROS  The following are not active complaints unless bolded Hoarseness, sore throat, dysphagia, dental problems, itching, sneezing,  nasal congestion or discharge of excess mucus or purulent secretions, ear ache,   fever, chills, sweats, unintended wt loss or wt gain, classically pleuritic or exertional cp,  orthopnea pnd or arm/hand swelling  or leg swelling, presyncope, palpitations, abdominal pain, anorexia, nausea, vomiting, diarrhea  or change in bowel habits or change in bladder habits, change in stools (GI w/u in progress) or change in urine, dysuria, hematuria,  rash, arthralgias, visual complaints, headache, numbness,  weakness or ataxia or problems with walking or coordination,  change in mood or  memory.            Outpatient Medications Prior to Visit  Medication Sig Dispense Refill   amiodarone  (PACERONE ) 200 MG tablet TAKE 1 TABLET BY MOUTH EVERY DAY 90 tablet 3   apixaban  (ELIQUIS ) 5 MG TABS tablet Take 1 tablet (5 mg total) by mouth 2 (two) times daily. 180 tablet 1   Aspirin-Salicylamide-Caffeine (BC HEADACHE POWDER PO) Take 1-2 Packages by mouth daily at 12 noon.     Cholecalciferol (VITAMIN D -3) 125 MCG (5000 UT) TABS Take 5,000 Units by mouth daily.     clotrimazole (LOTRIMIN) 1 % cream Apply 1 application topically 2 (two) times daily as needed (itching around ankles).     Coenzyme Q10 (CO Q 10) 100 MG CAPS Take 100 mg by mouth daily.     colestipol  (COLESTID ) 1 g tablet Take 2 tablets (2 g total) by mouth daily. 180 tablet 1   divalproex  (DEPAKOTE  ER) 500 MG 24 hr tablet Take 1 tablet (500 mg total) by mouth daily. 90 tablet 3   escitalopram  (LEXAPRO ) 20 MG tablet Take 1 tablet (20 mg total) by mouth daily. 90 tablet 2   estradiol  (ESTRACE ) 0.1 MG/GM vaginal cream Apply  a pea size amount of cream to urethral area of vagina twice weekly 42.5 g 12   Ferrous Gluconate (IRON  27 PO) Take 65 mg by mouth daily. Pt is taking 65mg /325 daily     Krill Oil 350 MG CAPS Take 350 mg by mouth daily.     lipase/protease/amylase (CREON ) 36000 UNITS CPEP capsule Take 2 capsules (72,000 Units total) by mouth 3 (three) times daily before meals. Two capsules with meals and one with snacks. 200 capsule 1   LORazepam  (ATIVAN ) 0.5 MG tablet Take 1 tablet (0.5 mg total) by mouth 3 (three) times daily as needed for anxiety. 90 tablet 3   Multiple Vitamin (MULTIVITAMIN) tablet Take 1 tablet by mouth daily.     Multiple Vitamins-Minerals (HAIR SKIN & NAILS ADVANCED PO) Take 3 tablets by mouth daily.     omeprazole  (PRILOSEC) 40 MG capsule Take 1 capsule (40 mg total) by mouth daily. 90 capsule 0   OVER THE COUNTER  MEDICATION LYSENE 1 daily per husband     oxyCODONE -acetaminophen  (PERCOCET) 7.5-325 MG tablet Take 1 tablet by mouth every 6 (six) hours as needed for severe pain (pain score 7-10). This script should last 30 days.  60 per month 60 tablet 0   polyethylene glycol (MIRALAX  / GLYCOLAX ) 17 g packet Take 17 g by mouth daily. 30 each 1   rosuvastatin  (CRESTOR ) 20 MG tablet TAKE 1 TABLET BY MOUTH EVERY DAY 90 tablet 3   Homeopathic Products (THERAWORX RELIEF EX) Apply 1 application. topically daily as needed (joint pain).     magnesium  30 MG tablet Take 30 mg by mouth daily.     No facility-administered medications prior to visit.    Past Medical History:  Diagnosis Date   Allergy    Anal carcinoma (HCC)    Chemo and radiation   Anemia    Anxiety    Dr. Okey at [place] (psychiatrist).     Blood transfusion without reported diagnosis 2017   Carotid artery disease (HCC)    Chronic kidney disease    DDD (degenerative disc disease), lumbosacral    GERD (gastroesophageal reflux disease)    Headache(784.0)    History of adenomatous polyp of colon 10/07/2015   Tubular adenoma high grade dysplasia   History of herpes genitalis    History of kidney stones    History of suicide attempt 2010   HTN (hypertension)    Hyperlipidemia    IBS (irritable bowel syndrome)    Major depression    OA (osteoarthritis)    Ovarian cancer (HCC)    Stage IIIB papillary ovarian carcinoma  s/p omentectomy and BSO & chemotherapy (completed 12-07-2014)   Paroxysmal atrial fibrillation (HCC)    Prolapse of vaginal vault after hysterectomy 07/20/2015   Rectovaginal fistula 08/05/2015   Seasonal allergies    Sigmoid diverticulosis    Smokers' cough (HCC)       Objective:     BP (!) 148/72   Pulse 76   Ht 5' 5 (1.651 m)   Wt 104 lb 9.6 oz (47.4 kg)   SpO2 95% Comment: room air  BMI 17.41 kg/m   SpO2: 95 % (room air)  Wt Readings from Last 3 Encounters:  10/31/23 104 lb 9.6 oz (47.4 kg)   10/04/23 103 lb 12.8 oz (47.1 kg)  09/18/23 105 lb (47.6 kg)   10/30/22             118    Frail elderly pale amb wf nad  HEENT : Oropharynx  clear       NECK :  without  apparent JVD/ palpable Nodes/TM    LUNGS: no acc muscle use,  Nl contour chest with classic bronchial BS with insp squeaks  L base, no localized wheeze    CV:  RRR  no s3 or murmur or increase in P2, and no edema   ABD:  soft and nontender   MS:  Gait a bit unsteady   ext warm without deformities Or obvious joint restrictions  calf tenderness, cyanosis or clubbing    SKIN: warm and dry without lesions    NEURO:  alert, approp, nl sensorium with  no motor or cerebellar deficits apparent.       Assessment   Pulmonary infiltrates on CXR Active smoker - onset of cough spring 2024 with persistent air bronchograms on CT first seen in abd scan done 12/14/22  - CT 08/30/23 : Very extensive consolidation and most likely some degree of fibrosis with associated volume loss in the dependent left lower lobe and posterior lingula, similar to prior examination dated 12/20/2022 (series 3, image 118). New consolidation and scattered heterogeneous opacities in the medial right lower lobe (series 3, image 134) as well as in the medial right upper lobe (series 3, image 80). Unchanged biapical pleural-parenchymal scarring. Small incidental left-sided Bochdalek's hernia.  - 10/31/2023   Walked on RA  x  2  lap(s) =  approx 300  ft @ very slow pace , gait was unsteady , pt was dizzy , she became tried on the 2nd lap with lowest sats 95%   She symptomatically improved on abx rx but cough and infiltrates persist with new areas on the R LL now suggestive of  BAC pattern or BOOP in the absence of any definite signs of infection.   Rec  >>>PET to look for other evidence of met dz which may lend itself to bx  >>>Broad lab profile to include tumor markers   >>>Hold all oil based products in case this is lipoid pna from recurrent  aspiration (note always sleeps Left side down and takes several oil based supplements)   >>>if no  leads consider FOB with Bx rather than continued empirical rx for pna   Cigarette smoker Counseled re importance of smoking cessation but did not meet time criteria for separate billing    Each maintenance medication was reviewed in detail including emphasizing most importantly the difference between maintenance and prns and under what circumstances the prns are to be triggered using an action plan format where appropriate.  Total time for H and P, chart review, counseling,  directly observing portions of ambulatory 02 saturation study/ and generating customized AVS unique to this office visit / same day charting = 60 min very complex pt with potentially life threatening dx.                Ozell America, MD 10/31/2023

## 2023-10-31 ENCOUNTER — Encounter: Payer: Self-pay | Admitting: Internal Medicine

## 2023-10-31 ENCOUNTER — Ambulatory Visit (INDEPENDENT_AMBULATORY_CARE_PROVIDER_SITE_OTHER): Payer: Medicare Other | Admitting: Internal Medicine

## 2023-10-31 VITALS — BP 148/72 | HR 76 | Ht 65.0 in | Wt 104.6 lb

## 2023-10-31 DIAGNOSIS — R918 Other nonspecific abnormal finding of lung field: Secondary | ICD-10-CM | POA: Diagnosis not present

## 2023-10-31 DIAGNOSIS — F17210 Nicotine dependence, cigarettes, uncomplicated: Secondary | ICD-10-CM | POA: Diagnosis not present

## 2023-10-31 NOTE — Assessment & Plan Note (Addendum)
 Active smoker - onset of cough spring 2024 with persistent air bronchograms on CT first seen in abd scan done 12/14/22  - CT 08/30/23 : Very extensive consolidation and most likely some degree of fibrosis with associated volume loss in the dependent left lower lobe and posterior lingula, similar to prior examination dated 12/20/2022 (series 3, image 118). New consolidation and scattered heterogeneous opacities in the medial right lower lobe (series 3, image 134) as well as in the medial right upper lobe (series 3, image 80). Unchanged biapical pleural-parenchymal scarring. Small incidental left-sided Bochdalek's hernia.  - 10/31/2023   Walked on RA  x  2  lap(s) =  approx 300  ft @ very slow pace , gait was unsteady , pt was dizzy , she became tried on the 2nd lap with lowest sats 95%   She symptomatically improved on abx rx but cough and infiltrates persist with new areas on the R LL now suggestive of  BAC pattern or BOOP in the absence of any definite signs of infection.   Rec  >>>PET to look for other evidence of met dz which may lend itself to bx  >>>Broad lab profile to include tumor markers   >>>Hold all oil based products in case this is lipoid pna from recurrent aspiration (note always sleeps Left side down and takes several oil based supplements)   >>>if no  leads consider FOB with Bx rather than continued empirical rx for pna

## 2023-10-31 NOTE — Assessment & Plan Note (Signed)
 Counseled re importance of smoking cessation but did not meet time criteria for separate billing    Each maintenance medication was reviewed in detail including emphasizing most importantly the difference between maintenance and prns and under what circumstances the prns are to be triggered using an action plan format where appropriate.  Total time for H and P, chart review, counseling,  directly observing portions of ambulatory 02 saturation study/ and generating customized AVS unique to this office visit / same day charting = 60 min very complex pt with potentially life threatening dx.

## 2023-10-31 NOTE — Patient Instructions (Addendum)
 Please remember to go to the lab department   for your tests - we will call you with the results when they are available.      My office will be contacting you by phone for referral  PET scanning   - if you don't hear back from my office within one week please call us  back or notify us  thru MyChart and we'll address it right away.   Once I have the information I  need to complete the work up I will call with recommendations. Please call in meantime if respiratory condition deteriorates

## 2023-11-01 LAB — CBC WITH DIFFERENTIAL/PLATELET
Basophils Absolute: 0 10*3/uL (ref 0.0–0.2)
Basos: 1 %
EOS (ABSOLUTE): 0 10*3/uL (ref 0.0–0.4)
Eos: 1 %
Hematocrit: 32.4 % — ABNORMAL LOW (ref 34.0–46.6)
Hemoglobin: 10.6 g/dL — ABNORMAL LOW (ref 11.1–15.9)
Immature Grans (Abs): 0 10*3/uL (ref 0.0–0.1)
Immature Granulocytes: 1 %
Lymphocytes Absolute: 1.3 10*3/uL (ref 0.7–3.1)
Lymphs: 20 %
MCH: 30.5 pg (ref 26.6–33.0)
MCHC: 32.7 g/dL (ref 31.5–35.7)
MCV: 93 fL (ref 79–97)
Monocytes Absolute: 0.5 10*3/uL (ref 0.1–0.9)
Monocytes: 8 %
Neutrophils Absolute: 4.6 10*3/uL (ref 1.4–7.0)
Neutrophils: 69 %
Platelets: 254 10*3/uL (ref 150–450)
RBC: 3.48 x10E6/uL — ABNORMAL LOW (ref 3.77–5.28)
RDW: 15.3 % (ref 11.7–15.4)
WBC: 6.6 10*3/uL (ref 3.4–10.8)

## 2023-11-01 LAB — CA 125: Cancer Antigen (CA) 125: 48.5 U/mL — ABNORMAL HIGH (ref 0.0–38.1)

## 2023-11-06 LAB — QUANTIFERON-TB GOLD PLUS
QuantiFERON Mitogen Value: 5.02 [IU]/mL
QuantiFERON Nil Value: 0.05 [IU]/mL
QuantiFERON TB1 Ag Value: 0.06 [IU]/mL
QuantiFERON TB2 Ag Value: 0.06 [IU]/mL
QuantiFERON-TB Gold Plus: NEGATIVE

## 2023-11-06 LAB — CEA: CEA: 13 ng/mL — ABNORMAL HIGH (ref 0.0–4.7)

## 2023-11-06 LAB — SEDIMENTATION RATE: Sed Rate: 14 mm/h (ref 0–40)

## 2023-11-06 LAB — CANCER ANTIGEN 19-9: CA 19-9: 32 U/mL (ref 0–35)

## 2023-11-08 ENCOUNTER — Ambulatory Visit (HOSPITAL_COMMUNITY): Payer: Medicare Other

## 2023-11-08 ENCOUNTER — Encounter (HOSPITAL_COMMUNITY): Payer: Self-pay

## 2023-11-15 ENCOUNTER — Ambulatory Visit (HOSPITAL_COMMUNITY)
Admission: RE | Admit: 2023-11-15 | Discharge: 2023-11-15 | Disposition: A | Discharge status: Home / Self Care | Payer: Medicare Other | Source: Ambulatory Visit | Attending: Internal Medicine | Admitting: Internal Medicine

## 2023-11-15 DIAGNOSIS — R918 Other nonspecific abnormal finding of lung field: Secondary | ICD-10-CM | POA: Insufficient documentation

## 2023-11-15 MED ORDER — FLUDEOXYGLUCOSE F - 18 (FDG) INJECTION
5.1800 | Freq: Once | INTRAVENOUS | Status: AC | PRN
  Administered 2023-11-15: 5.18 via INTRAVENOUS

## 2023-11-25 NOTE — Progress Notes (Deleted)
 Abigail Wagner, female    DOB: 10-Apr-1951    MRN: [id number]   Brief patient profile:  55  yowf  active smoker  referred to pulmonary clinic in [place]  10/31/2023 by Dr Abigail Wagner with persistent AS Disease   since march 2024 in setting of ovarian Ca s/p resection [facility] and then chemo in [place] and and radiation at [facility]   Last oncololgy note  PRIOR THERAPY:  1. Debulking surgery with BSO on 07/04/2014. 2. Chemoradiation therapy with 5-FU and mitomycin completed on 12/06/2015.   NGS Results: Not done   CURRENT THERAPY: Surveillance   BRIEF ONCOLOGIC HISTORY:     Oncology History  History of ovarian cancer  06/02/2014 Procedure    Diagnostic laparoscopy, lysis of adhesions, biopsy of omental nodules, biopsy right ovary by Dr Abigail Wagner    06/04/2014 Pathology Results    Diagnosis 1. Omentum, biopsy, Anterior peritoneal & omental nodule - SEROUS CARCINOMA WITH ASSOCIATED ABUNDANT PSAMMOMA BODIES AND DESMOPLASTIC STROMAL REACTION CONSISTENT WITH INVASIVE IMPLANTS. - SEE COMMENT. 2. Omentum, biopsy, Omental nodule - SEROUS CARCINOMA WITH ASSOCIATED ABUNDANT PSAMMOMA BODIES AND DESMOPLASTIC STROMAL REACTION CONSISTENT WITH INVASIVE IMPLANTS. - SEE COMMENT. 3. Ovary, biopsy/wedge resection, Right ovary - SMALL FRAGMENTS OF ATYPICAL PAPILLARY PROLIFERATION WITH PSAMMOMA BODIES CONSISTENT WITH AT LEAST SEROUS BORDERLINE TUMOR.    07/03/2014 Procedure    Exploratory laparotomy, omentectomy, Bilateral salpingo-oophorectomy, inptraperitoneal PORT placement by Dr. Duard Wagner            07/24/2014 - 12/07/2014 Chemotherapy    Carboplatin/Paclitaxel x 6 cycles      08/17/2014 Procedure    Insertion of 8 French power port clear view tunneled venous vascular access device next line use of fluoroscopy for guidance and positioning Use of ultrasound for venipuncture Removal of intraperitoneal chemotherapy port By Dr. Derrell Wagner    05/14/2017 Genetic Testing    Negative genetic testing on the [facility]  panel.  The [facility] gene panel offered by [employer] includes sequencing and deletion/duplication testing of the following 28 genes: APC, ATM, BARD1, BMPR1A, BRCA1, BRCA2, BRIP1, CHD1, CDK4, CDKN2A, CHEK2, EPCAM (large rearrangement only), MLH1, MSH2, MSH6, MUTYH, NBN, PALB2, PMS2, PTEN, RAD51C, RAD51D, SMAD4, STK11, and TP53. Sequencing was performed for select regions of POLE and POLD1, and large rearrangement analysis was performed for select regions of GREM1. The report date is April 27, 2017.   HRD testing was performed on the ovarian tumor.  The tumor was negative for any deleterious BRCA1 or BRCA2 mutations.    Genomic instability status was not interpretable.  Therefore, [facility] was unable to analyze the ovarian tumor for genomic instability.  The report date is May 14, 2017.      Anal cancer (HCC)  09/09/2015 Procedure    Rectovaginal septal mass biopsy by Dr. Maisie Wagner    09/10/2015 Pathology Results    Soft tissue mass, biopsy, rectovaginal septal mass - SQUAMOUS CELL CARCINOMA.    10/15/2015 PET scan    1. Focal hypermetabolism with associated ill-defined soft tissue fullness at the junction of the anterior anal wall and posterior inferior vaginal wall, in keeping with the provided history of a primary anal carcinoma. 2. No hypermetabolic locoregional or distant metastatic disease. 3. Status post hysterectomy, with no abnormal findings at the vaginal cuff. No evidence of hypermetabolic peritoneal tumor. 4. Tiny nonobstructing bilateral renal stones.    10/25/2015 - 12/06/2015 Radiation Therapy    [place], [place]    10/25/2015 - 12/06/2015 Chemotherapy    Mitomycin C and 5FU,  by Dr. Ubaldo Wagner      06/15/2016 Procedure    EXCISION RECTOVAGINAOL FISTULA WITH MUCOSAL ADVANCEMENT FLAP by Dr. Maisie Wagner    06/18/2016 Pathology Results    Fistula, rectovaginal SQUAMOUS, COLUMNAR JUNCTION MUCOSA AND SUBCUTANOUS SOFT TISSUE WITH FISTULA NO EVIDENCE OF MALIGNANCY     09/20/2016 Imaging    CT CAP- 1. No acute findings and no evidence for recurrent tumor or metastatic disease. 2. Aortic atherosclerosis and coronary artery calcification        CANCER STAGING: Cancer Staging Anal cancer (HCC) Staging form: Anus, AJCC 8th Edition - Clinical: Stage IIA (cT2, cN0, cM0) - Signed by Abigail Newer, PA-C on 09/27/2016      History of Present Illness  10/31/2023  Pulmonary/ 1st Wagner eval/ Abigail Wagner / Abigail Wagner  Chief Complaint  Patient presents with   Consult  Dyspnea:  more dizzy than sob when walking no longer shopping  Cough: yellowish minimal in am/ never hemoptysis  Sleep: bed is flat / L side down always one pillow  SABA use: none 02: none   Rec   11/28/2023  f/u ov/Abigail Wagner/Abigail Wagner re: *** maint on ***  No chief complaint on file.   Dyspnea:  *** Cough: *** Sleeping: ***   resp cc  SABA use: *** 02: ***  Lung cancer screening: ***   No obvious day to day or daytime variability or assoc excess/ purulent sputum or mucus plugs or hemoptysis or cp or chest tightness, subjective wheeze or overt sinus or hb symptoms.    Also denies any obvious fluctuation of symptoms with weather or environmental changes or other aggravating or alleviating factors except as outlined above   No unusual exposure hx or h/o childhood pna/ asthma or knowledge of premature birth.  Current Allergies, Complete Past Medical History, Past Surgical History, Family History, and Social History were reviewed in [employer] record.  ROS  The following are not active complaints unless bolded Hoarseness, sore throat, dysphagia, dental problems, itching, sneezing,  nasal congestion or discharge of excess mucus or purulent secretions, ear ache,   fever, chills, sweats, unintended wt loss or wt gain, classically pleuritic or exertional cp,  orthopnea pnd or arm/hand swelling  or leg swelling, presyncope, palpitations, abdominal pain, anorexia,  nausea, vomiting, diarrhea  or change in bowel habits or change in bladder habits, change in stools or change in urine, dysuria, hematuria,  rash, arthralgias, visual complaints, headache, numbness, weakness or ataxia or problems with walking or coordination,  change in mood or  memory.        No outpatient medications have been marked as taking for the 11/28/23 encounter (Appointment) with Nyoka Cowden, MD.         Past Medical History:  Diagnosis Date   Allergy    Anal carcinoma (HCC)    Chemo and radiation   Anemia    Anxiety    Dr. Tenny Craw at [place] (psychiatrist).     Blood transfusion without reported diagnosis 2017   Carotid artery disease (HCC)    Chronic kidney disease    DDD (degenerative disc disease), lumbosacral    GERD (gastroesophageal reflux disease)    Headache(784.0)    History of adenomatous polyp of colon 10/07/2015   Tubular adenoma high grade dysplasia   History of herpes genitalis    History of kidney stones    History of suicide attempt 2010   HTN (hypertension)    Hyperlipidemia    IBS (irritable bowel syndrome)  Major depression    OA (osteoarthritis)    Ovarian cancer (HCC)    Stage IIIB papillary ovarian carcinoma  s/p omentectomy and BSO & chemotherapy (completed 12-07-2014)   Paroxysmal atrial fibrillation (HCC)    Prolapse of vaginal vault after hysterectomy 07/20/2015   Rectovaginal fistula 08/05/2015   Seasonal allergies    Sigmoid diverticulosis    Smokers' cough (HCC)       Objective:     Wt Readings from Last 3 Encounters:  10/31/23 104 lb 9.6 oz (47.4 kg)  10/04/23 103 lb 12.8 oz (47.1 kg)  09/18/23 105 lb (47.6 kg)      Vital signs reviewed  11/28/2023  - Note at rest 02 sats  ***% on ***   General appearance:    ***     Wt Readings from Last 3 Encounters:  10/31/23 104 lb 9.6 oz (47.4 kg)  10/04/23 103 lb 12.8 oz (47.1 kg)  09/18/23 105 lb (47.6 kg)   10/30/22             118   Vital signs reviewed  11/28/2023  -  Note at rest 02 sats  ***% on ***   General appearance:    ***    classic bronchial BS with insp squeaks  L base, no localized wheeze***         Assessment

## 2023-11-26 ENCOUNTER — Other Ambulatory Visit: Payer: Self-pay | Admitting: Family Medicine

## 2023-11-26 MED ORDER — OXYCODONE-ACETAMINOPHEN 7.5-325 MG PO TABS
1.0000 | ORAL_TABLET | Freq: Four times a day (QID) | ORAL | 0 refills | Status: DC | PRN
Start: 2023-11-26 — End: 2023-12-27

## 2023-11-27 ENCOUNTER — Telehealth: Payer: Self-pay | Admitting: Internal Medicine
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
## 2023-11-28 ENCOUNTER — Ambulatory Visit: Payer: Medicare Other | Admitting: Internal Medicine

## 2023-12-11 ENCOUNTER — Encounter (INDEPENDENT_AMBULATORY_CARE_PROVIDER_SITE_OTHER): Payer: Self-pay

## 2023-12-19 ENCOUNTER — Other Ambulatory Visit: Payer: Self-pay | Admitting: Family Medicine

## 2023-12-20 NOTE — Telephone Encounter (Signed)
 Requested Prescriptions  Pending Prescriptions Disp Refills   omeprazole (PRILOSEC) 40 MG capsule [Pharmacy Med Name: OMEPRAZOLE DR 40 MG CAPSULE] 90 capsule 0    Sig: TAKE 1 CAPSULE (40 MG TOTAL) BY MOUTH DAILY.     Gastroenterology: Proton Pump Inhibitors Passed - 12/20/2023  1:59 PM      Passed - Valid encounter within last 12 months    Recent Outpatient Visits           3 months ago Chronic diarrhea   [facility] [place], FNP   5 months ago Community acquired pneumonia, unspecified laterality   [place] [facility] Pickard, Priscille Heidelberg, MD   1 year ago History of anal cancer   [place] [facility] Donita Brooks, MD   1 year ago Community acquired pneumonia of right lower lobe of lung   [place] [facility] Pickard, Priscille Heidelberg, MD   1 year ago Left hip pain   [place] [facility] Pickard, Priscille Heidelberg, MD

## 2023-12-27 ENCOUNTER — Other Ambulatory Visit: Payer: Self-pay | Admitting: Family Medicine

## 2023-12-27 MED ORDER — OXYCODONE-ACETAMINOPHEN 7.5-325 MG PO TABS: 1.0000 | ORAL_TABLET | Freq: Four times a day (QID) | ORAL | 0 refills | Status: DC | PRN

## 2023-12-28 ENCOUNTER — Emergency Department (HOSPITAL_COMMUNITY)
Admission: EM | Admit: 2023-12-28 | Discharge: 2023-12-29 | Discharge status: Against Medical Advice | Attending: Emergency Medicine | Admitting: Emergency Medicine

## 2023-12-28 ENCOUNTER — Encounter (HOSPITAL_COMMUNITY): Payer: Self-pay

## 2023-12-28 ENCOUNTER — Other Ambulatory Visit: Payer: Self-pay

## 2023-12-28 ENCOUNTER — Emergency Department (HOSPITAL_COMMUNITY)

## 2023-12-28 DIAGNOSIS — D62 Acute posthemorrhagic anemia: Secondary | ICD-10-CM

## 2023-12-28 DIAGNOSIS — Z7982 Long term (current) use of aspirin: Secondary | ICD-10-CM | POA: Diagnosis not present

## 2023-12-28 DIAGNOSIS — N189 Chronic kidney disease, unspecified: Secondary | ICD-10-CM | POA: Insufficient documentation

## 2023-12-28 DIAGNOSIS — Z8543 Personal history of malignant neoplasm of ovary: Secondary | ICD-10-CM | POA: Insufficient documentation

## 2023-12-28 DIAGNOSIS — I129 Hypertensive chronic kidney disease with stage 1 through stage 4 chronic kidney disease, or unspecified chronic kidney disease: Secondary | ICD-10-CM | POA: Diagnosis not present

## 2023-12-28 DIAGNOSIS — R197 Diarrhea, unspecified: Secondary | ICD-10-CM | POA: Insufficient documentation

## 2023-12-28 DIAGNOSIS — S0101XA Laceration without foreign body of scalp, initial encounter: Secondary | ICD-10-CM | POA: Insufficient documentation

## 2023-12-28 DIAGNOSIS — W228XXA Striking against or struck by other objects, initial encounter: Secondary | ICD-10-CM | POA: Diagnosis not present

## 2023-12-28 DIAGNOSIS — I4581 Long QT syndrome: Secondary | ICD-10-CM | POA: Diagnosis not present

## 2023-12-28 DIAGNOSIS — Z85048 Personal history of other malignant neoplasm of rectum, rectosigmoid junction, and anus: Secondary | ICD-10-CM | POA: Insufficient documentation

## 2023-12-28 DIAGNOSIS — R9431 Abnormal electrocardiogram [ECG] [EKG]: Secondary | ICD-10-CM

## 2023-12-28 DIAGNOSIS — E876 Hypokalemia: Secondary | ICD-10-CM | POA: Diagnosis not present

## 2023-12-28 DIAGNOSIS — Z7901 Long term (current) use of anticoagulants: Secondary | ICD-10-CM | POA: Diagnosis not present

## 2023-12-28 DIAGNOSIS — W19XXXA Unspecified fall, initial encounter: Secondary | ICD-10-CM

## 2023-12-28 DIAGNOSIS — D500 Iron deficiency anemia secondary to blood loss (chronic): Secondary | ICD-10-CM | POA: Insufficient documentation

## 2023-12-28 MED ORDER — LIDOCAINE-EPINEPHRINE (PF) 2 %-1:200000 IJ SOLN
INTRAMUSCULAR | Status: AC
Start: 2023-12-28 — End: 2023-12-28
  Filled 2023-12-28: qty 20

## 2023-12-28 NOTE — ED Provider Notes (Signed)
 Patient brought in by EMS with a laceration to the head that was unable to have hemostasis obtained.  Attempted 5 staples without control.  Appears to be coming from a small puncture type wound.  1 mL of lidocaine with epi instilled and figure-of-eight stitch was placed to obtain hemostasis.  .Laceration Repair  Date/Time: 12/28/2023 11:11 PM  Performed by: Rondel Baton, MD Authorized by: Rondel Baton, MD   Consent:    Consent obtained:  Emergent situation Laceration details:    Location:  Scalp   Scalp location:  R parietal   Length (cm):  0.5 Approximation:    Approximation:  Close Repair type:    Repair type:  Simple Post-procedure details:    Procedure completion:  Tolerated well, no immediate complications Comments:     Figure-of-eight stitch.     Rondel Baton, MD 12/28/23 2312

## 2023-12-28 NOTE — ED Provider Notes (Signed)
 [place] EMERGENCY DEPARTMENT AT [facility] Provider Note   CSN: [id number] Arrival date & time: 12/28/23  2248     History  Chief Complaint  Patient presents with   Head Injury    Abigail Wagner is a 73 y.o. female with PMH as listed below who presents with fall/head trauma. Patient states she slipped in the bathroom while cleaning the tub approximately 1 week ago and did not get seen in the hospital.  Did not have loss of consciousness or nausea vomiting at that time.  Did have a laceration which scabbed over.  Today she picked at the scab and had extreme amount of bleeding x 1 hour.  Husband could not get it to stop and so called 911. Patient is on eliquis.  Denies any pain elsewhere or any neck pain.  She has a history of anal carcinoma and has chronic diarrhea, 4-6 soft stools per day.  Otherwise has been eating and drinking well, no fevers chills, abdominal pain, chest pain, shortness of breath.  Per chart review, patient has been dealing with fatigue dyspnea on exertion.  Possible pulmonary fibrosis, BAC, or Boop.  At that time she had reported dizziness but denies any dizziness now.  She states she did not fall today because she was dizzy or lost consciousness.   Past Medical History:  Diagnosis Date   Allergy    Anal carcinoma (HCC)    Chemo and radiation   Anemia    Anxiety    Dr. Tenny Craw at [place] (psychiatrist).     Blood transfusion without reported diagnosis 2017   Carotid artery disease (HCC)    Chronic kidney disease    DDD (degenerative disc disease), lumbosacral    GERD (gastroesophageal reflux disease)    Headache(784.0)    History of adenomatous polyp of colon 10/07/2015   Tubular adenoma high grade dysplasia   History of herpes genitalis    History of kidney stones    History of suicide attempt 2010   HTN (hypertension)    Hyperlipidemia    IBS (irritable bowel syndrome)    Major depression    OA (osteoarthritis)    Ovarian cancer (HCC)     Stage IIIB papillary ovarian carcinoma  s/p omentectomy and BSO & chemotherapy (completed 12-07-2014)   Paroxysmal atrial fibrillation (HCC)    Prolapse of vaginal vault after hysterectomy 07/20/2015   Rectovaginal fistula 08/05/2015   Seasonal allergies    Sigmoid diverticulosis    Smokers' cough (HCC)        Home Medications Prior to Admission medications   Medication Sig Start Date End Date Taking? Authorizing Provider  amiodarone (PACERONE) 200 MG tablet TAKE 1 TABLET BY MOUTH EVERY DAY 06/29/23   Jonelle Sidle, MD  apixaban (ELIQUIS) 5 MG TABS tablet Take 1 tablet (5 mg total) by mouth 2 (two) times daily. 07/13/23   Donita Brooks, MD  Aspirin-Salicylamide-Caffeine (BC HEADACHE POWDER PO) Take 1-2 Packages by mouth daily at 12 noon.    [provider]  Cholecalciferol (VITAMIN D-3) 125 MCG (5000 UT) TABS Take 5,000 Units by mouth daily.    [provider]  clotrimazole (LOTRIMIN) 1 % cream Apply 1 application topically 2 (two) times daily as needed (itching around ankles).    [provider]  Coenzyme Q10 (CO Q 10) 100 MG CAPS Take 100 mg by mouth daily.    [provider]  colestipol (COLESTID) 1 g tablet Take 2 tablets (2 g total) by mouth  daily. 10/04/23   Marguerita Merles, Reuel Boom, MD  divalproex (DEPAKOTE ER) 500 MG 24 hr tablet Take 1 tablet (500 mg total) by mouth daily. 09/03/23   Myrlene Broker, MD  escitalopram (LEXAPRO) 20 MG tablet Take 1 tablet (20 mg total) by mouth daily. 09/03/23   Myrlene Broker, MD  estradiol (ESTRACE) 0.1 MG/GM vaginal cream Apply a pea size amount of cream to urethral area of vagina twice weekly 03/03/22   Summerlin, Regan Rakers, PA-C  Ferrous Gluconate (IRON 27 PO) Take 65 mg by mouth daily. Pt is taking 65mg /325 daily    [provider]  Boris Lown Oil 350 MG CAPS Take 350 mg by mouth daily.    [provider]  lipase/protease/amylase (CREON) 36000 UNITS CPEP capsule Take 2 capsules (72,000  Units total) by mouth 3 (three) times daily before meals. Two capsules with meals and one with snacks. 10/23/23   Dolores Frame, MD  LORazepam (ATIVAN) 0.5 MG tablet Take 1 tablet (0.5 mg total) by mouth 3 (three) times daily as needed for anxiety. 09/03/23   Myrlene Broker, MD  Multiple Vitamin (MULTIVITAMIN) tablet Take 1 tablet by mouth daily.    [provider]  Multiple Vitamins-Minerals (HAIR SKIN & NAILS ADVANCED PO) Take 3 tablets by mouth daily.    [provider]  omeprazole (PRILOSEC) 40 MG capsule TAKE 1 CAPSULE (40 MG TOTAL) BY MOUTH DAILY. 12/20/23   Donita Brooks, MD  OVER THE COUNTER MEDICATION LYSENE 1 daily per husband    [provider]  oxyCODONE-acetaminophen (PERCOCET) 7.5-325 MG tablet Take 1 tablet by mouth every 6 (six) hours as needed for severe pain (pain score 7-10). This script should last 30 days.  60 per month 12/27/23   Donita Brooks, MD  polyethylene glycol (MIRALAX / GLYCOLAX) 17 g packet Take 17 g by mouth daily. 05/28/20   Vassie Loll, MD  rosuvastatin (CRESTOR) 20 MG tablet TAKE 1 TABLET BY MOUTH EVERY DAY 04/09/23   Donita Brooks, MD      Allergies    Morphine and codeine    Review of Systems   Review of Systems A 10 point review of systems was performed and is negative unless otherwise reported in HPI.  Physical Exam Updated Vital Signs BP (!) 171/78   Pulse 63   Resp 14   SpO2 98%  Physical Exam General: Normal appearing elderly female, lying in bed.  HEENT: Right small parietal scalp laceration closed with a figure-of-eight suture, hemostatic.  No skull depressions or deformities.  PERRLA, EOMI, sclera anicteric, MMM, trachea midline.  Cardiology: RRR, no murmurs/rubs/gallops.  No chest wall tenderness palpation. Resp: Normal respiratory rate and effort. CTAB, no wheezes, rhonchi, crackles.  Abd: Soft, non-tender, non-distended. No rebound tenderness or guarding.  Pelvis: Pelvis stable and  nontender. MSK: No peripheral edema or signs of trauma. Extremities without deformity or TTP. No cyanosis or clubbing. Skin: Pale.  Warm, dry Back: No midline CT or L-spine tenderness palpation step-offs or deformities Neuro: A&Ox4, CNs II-XII grossly intact. MAEs. Sensation grossly intact.  Psych: Normal mood and affect.   ED Results / Procedures / Treatments   Labs (all labs ordered are listed, but only abnormal results are displayed) Labs Reviewed  CBC WITH DIFFERENTIAL/PLATELET - Abnormal; Notable for the following components:      Result Value   WBC 3.6 (*)    RBC 2.85 (*)    Hemoglobin 8.9 (*)    HCT 27.9 (*)  RDW 16.8 (*)    All other components within normal limits  BASIC METABOLIC PANEL WITH GFR - Abnormal; Notable for the following components:   Potassium 2.3 (*)    Calcium 8.1 (*)    All other components within normal limits  MAGNESIUM  PROTIME-INR  APTT  CBG MONITORING, ED    EKG EKG Interpretation Date/Time:  Friday December 28 2023 23:13:53 EDT Ventricular Rate:  61 PR Interval:  174 QRS Duration:  84 QT Interval:  652 QTC Calculation: 657 R Axis:   38  Text Interpretation: Sinus rhythm Borderline repolarization abnormality Prolonged QT interval When compared to prior, prolonged QT is new Confirmed by Vivi Barrack [phone]) on 12/28/2023 11:32:28 PM  Radiology CT Head Wo Contrast Result Date: 12/28/2023 CLINICAL DATA:  Blunt trauma to the head EXAM: CT HEAD WITHOUT CONTRAST TECHNIQUE: Contiguous axial images were obtained from the base of the skull through the vertex without intravenous contrast. RADIATION DOSE REDUCTION: This exam was performed according to the departmental dose-optimization program which includes automated exposure control, adjustment of the mA and/or kV according to patient size and/or use of iterative reconstruction technique. COMPARISON:  None Available. FINDINGS: Brain: No evidence of acute infarction, hemorrhage, hydrocephalus, extra-axial  collection or mass lesion/mass effect. Mild atrophic changes are noted. Vascular: No hyperdense vessel or unexpected calcification. Skull: Normal. Negative for fracture or focal lesion. Sinuses/Orbits: No acute finding. Other: Mild scalp swelling is noted superiorly in the right parietal region consistent with the recent injury. IMPRESSION: Mild atrophic changes without acute intracranial abnormality. Electronically Signed   By: Alcide Clever M.D.   On: 12/28/2023 23:28    Procedures Procedures    Medications Ordered in ED Medications  lidocaine-EPINEPHrine (XYLOCAINE W/EPI) 2 %-1:200000 (PF) injection (  Given by Other 12/28/23 2300)  potassium chloride SA (KLOR-CON M) CR tablet 40 mEq (40 mEq Oral Given 12/29/23 0056)    ED Course/ Medical Decision Making/ A&P                          Medical Decision Making Amount and/or Complexity of Data Reviewed Labs: ordered. Decision-making details documented in ED Course. Radiology: ordered. Decision-making details documented in ED Course.  Risk Prescription drug management.    This patient presents to the ED for concern of fall, head trauma, scalp laceration bleeding, this involves an extensive number of treatment options, and is a complaint that carries with it a high risk of complications and morbidity.  I considered the following differential and admission for this acute, potentially life threatening condition.   MDM:    On patient's arrival to the emergency department prior to my arrival to my shift today, patient was seen by the off going provider who noted her bleeding from her right scalp laceration and placed a figure-of-eight suture which stopped the bleeding.  During her greater than 2-hour stay in the emergency department she does not have any recurrent bleeding.  She had bled for a significant period of time and the husband states it was "profuse."  She is found to have acute blood loss anemia down from baseline 10-11 to 8.9.  Patient  denies feeling lightheaded.  EKG today demonstrates new prolonged QT.  She does not have a more recent EKG in the system than 2022. Patient does take amiodarone, but has not had any recent changes to his medications.  Considered electrolyte abnormalities and indeed she has hypokalemia.  She does not take any diuretics and has been  eating and drinking well, this is likely due to her chronic diarrhea.  I advised the patient that given her severe hypokalemia and EKG changes with ongoing chronic diarrhea she will need to be admitted to the hospital for potassium supplementation.  Also advised recheck of her hemoglobin as well given that it will likely further decrease as it equilibrates.  Patient reports that she understands the recommendation but would like to be discharged.  Patient will be leaving AGAINST MEDICAL ADVICE.  Patient understands the risk of leaving including but not limited to worsening of her electrolyte derangements, fatal arrhythmias, coma and death.  Patient demonstrates capacity and is advised to follow-up with her primary care physician within the next couple of days for lab recheck.  She is given 40 mEq p.o. potassium here and discharged with potassium supplementation 20 mEq qd x 5 days.     Clinical Course as of 12/29/23 0656  Fri Dec 28, 2023  2331 CT Head Wo Contrast Mild atrophic changes without acute intracranial abnormality. [HN]  Sat Dec 29, 2023  0015 Hemoglobin(!): 8.9 Down from baseline 10-11 [HN]  0015 INR: 1.2 [HN]  0015 Prothrombin Time: 15.0 [HN]  0015 APTT: 31 Coags wnl [HN]  0050 Potassium(!!): 2.3 Hypokalemia likely causing QT prolongation [HN]  0051 Creatinine: 0.91 [HN]    Clinical Course User Index [HN] Loetta Rough, MD    Labs: I Ordered, and personally interpreted labs.  The pertinent results include:  those listed above  Imaging Studies ordered: I ordered imaging studies including CTH I independently visualized and interpreted imaging. I  agree with the radiologist interpretation  Additional history obtained from chart review.    Cardiac Monitoring: The patient was maintained on a cardiac monitor.  I personally viewed and interpreted the cardiac monitored which showed an underlying rhythm of: NSR  Reevaluation: After the interventions noted above, I reevaluated the patient and found that they have :improved  Social Determinants of Health: Lives independently  Disposition:  AMA  Co morbidities that complicate the patient evaluation  Past Medical History:  Diagnosis Date   Allergy    Anal carcinoma (HCC)    Chemo and radiation   Anemia    Anxiety    Dr. Tenny Craw at [place] (psychiatrist).     Blood transfusion without reported diagnosis 2017   Carotid artery disease (HCC)    Chronic kidney disease    DDD (degenerative disc disease), lumbosacral    GERD (gastroesophageal reflux disease)    Headache(784.0)    History of adenomatous polyp of colon 10/07/2015   Tubular adenoma high grade dysplasia   History of herpes genitalis    History of kidney stones    History of suicide attempt 2010   HTN (hypertension)    Hyperlipidemia    IBS (irritable bowel syndrome)    Major depression    OA (osteoarthritis)    Ovarian cancer (HCC)    Stage IIIB papillary ovarian carcinoma  s/p omentectomy and BSO & chemotherapy (completed 12-07-2014)   Paroxysmal atrial fibrillation (HCC)    Prolapse of vaginal vault after hysterectomy 07/20/2015   Rectovaginal fistula 08/05/2015   Seasonal allergies    Sigmoid diverticulosis    Smokers' cough (HCC)      Medicines Meds ordered this encounter  Medications   lidocaine-EPINEPHrine (XYLOCAINE W/EPI) 2 %-1:200000 (PF) injection    Mosteller, Maci N: cabinet override   potassium chloride SA (KLOR-CON M) CR tablet 40 mEq    I have reviewed the patients home medicines and  have made adjustments as needed  Problem List / ED Course: Problem List Items Addressed This Visit    None Visit Diagnoses       Fall, initial encounter    -  Primary     Laceration of scalp, initial encounter         Prolonged Q-T interval on ECG         Hypokalemia         Diarrhea, unspecified type         Acute blood loss anemia                       This note was created using dictation software, which may contain spelling or grammatical errors.    Loetta Rough, MD 12/29/23 [phone]

## 2023-12-28 NOTE — ED Notes (Signed)
 Patient transported to CT

## 2023-12-28 NOTE — ED Triage Notes (Signed)
 Patient struck head about a week ago and did not get it checked. Patient husband found today patient in bathroom bleeding from right side of head and called EMS. Patient actively bleeding. Patient is on eliquis.

## 2023-12-29 ENCOUNTER — Telehealth (HOSPITAL_COMMUNITY): Payer: Self-pay | Admitting: Emergency Medicine

## 2023-12-29 LAB — CBC WITH DIFFERENTIAL/PLATELET
Abs Immature Granulocytes: 0.02 10*3/uL (ref 0.00–0.07)
Basophils Absolute: 0 10*3/uL (ref 0.0–0.1)
Basophils Relative: 1 %
Eosinophils Absolute: 0.1 10*3/uL (ref 0.0–0.5)
Eosinophils Relative: 3 %
HCT: 27.9 % — ABNORMAL LOW (ref 36.0–46.0)
Hemoglobin: 8.9 g/dL — ABNORMAL LOW (ref 12.0–15.0)
Immature Granulocytes: 1 %
Lymphocytes Relative: 30 %
Lymphs Abs: 1.1 10*3/uL (ref 0.7–4.0)
MCH: 31.2 pg (ref 26.0–34.0)
MCHC: 31.9 g/dL (ref 30.0–36.0)
MCV: 97.9 fL (ref 80.0–100.0)
Monocytes Absolute: 0.4 10*3/uL (ref 0.1–1.0)
Monocytes Relative: 11 %
Neutro Abs: 2 10*3/uL (ref 1.7–7.7)
Neutrophils Relative %: 54 %
Platelets: 190 10*3/uL (ref 150–400)
RBC: 2.85 MIL/uL — ABNORMAL LOW (ref 3.87–5.11)
RDW: 16.8 % — ABNORMAL HIGH (ref 11.5–15.5)
WBC: 3.6 10*3/uL — ABNORMAL LOW (ref 4.0–10.5)
nRBC: 0 % (ref 0.0–0.2)

## 2023-12-29 LAB — BASIC METABOLIC PANEL WITH GFR
Anion gap: 7 (ref 5–15)
BUN: 19 mg/dL (ref 8–23)
CO2: 28 mmol/L (ref 22–32)
Calcium: 8.1 mg/dL — ABNORMAL LOW (ref 8.9–10.3)
Chloride: 104 mmol/L (ref 98–111)
Creatinine, Ser: 0.91 mg/dL (ref 0.44–1.00)
GFR, Estimated: >60 mL/min (ref >60)
Glucose, Bld: 98 mg/dL (ref 70–99)
Potassium: 2.3 mmol/L — CL (ref 3.5–5.1)
Sodium: 139 mmol/L (ref 135–145)

## 2023-12-29 LAB — MAGNESIUM: Magnesium: 1.9 mg/dL (ref 1.7–2.4)

## 2023-12-29 LAB — APTT: aPTT: 31 s (ref 24–36)

## 2023-12-29 LAB — PROTIME-INR
INR: 1.2 (ref 0.8–1.2)
Prothrombin Time: 15 s (ref 11.4–15.2)

## 2023-12-29 MED ORDER — POTASSIUM CHLORIDE CRYS ER 20 MEQ PO TBCR: 20.0000 meq | EXTENDED_RELEASE_TABLET | Freq: Every day | ORAL | 0 refills | Status: DC

## 2023-12-29 MED ORDER — POTASSIUM CHLORIDE CRYS ER 20 MEQ PO TBCR
40.0000 meq | EXTENDED_RELEASE_TABLET | Freq: Once | ORAL | Status: AC
  Administered 2023-12-29: 40 meq via ORAL
  Filled 2023-12-29: qty 2

## 2023-12-29 NOTE — Discharge Instructions (Addendum)
 Thank you for coming to [facility] Emergency Department.  You were seen for bleeding from the scalp.  You had a laceration to the scalp which was repaired with 1 single figure-of-eight stitch.  This will need to be removed in 7 to 10 days by a medical provider.  You can shower as normal but do not scrub the area.  Please keep the area clean and dry.  Because of the bleeding your hemoglobin dropped to 8.9.  We also noted that your potassium was extremely low at 2.3.  This is likely due to your chronic diarrhea.  You had EKG changes called prolonged QT interval that can be very dangerous to you.  We recommended admission to the hospital, potassium supplementation, and recheck of labs in the morning including both potassium and hemoglobin, but you declined.  You are leaving from the emergency department AGAINST MEDICAL ADVICE.  You have accepted the risk of leaving including but not limited to fatal heart rhythms and death.   Please follow up with your primary care provider within 1 week for lab recheck.   Please return to the ED at your earliest convenience. Please return if you experience: -Worsening symptoms -Chest pain, shortness of breath -Palpitations -Lightheadedness, passing out -Fevers/chills -Anything else that concerns you

## 2023-12-29 NOTE — ED Notes (Signed)
 Patient stated that she did not want to stay for IV potassium replacements.stated that she will take it orally and go home. Edp notified and patient to leave ama. Patient signed and wheeled out with family.

## 2023-12-29 NOTE — Telephone Encounter (Signed)
 Prescribed potassium supplementation 20 mEq x 5 days to pharmacy.

## 2023-12-29 NOTE — ED Notes (Signed)
 Patient ambulatory from restroom

## 2023-12-30 NOTE — Progress Notes (Signed)
 Abigail Wagner, female    DOB: 1951-06-05    MRN: [id number]   Brief patient profile:  71  yowf  active smoker  referred to pulmonary clinic in [place]  10/31/2023 by Dr Abigail Wagner with persistent AS Disease   since march 2024 in setting of ovarian Ca s/p resection [facility] and then chemo in [place] and and radiation at [facility]   Last oncololgy note  PRIOR THERAPY:  1. Debulking surgery with BSO on 07/04/2014. 2. Chemoradiation therapy with 5-FU and mitomycin completed on 12/06/2015.   NGS Results: Not done   CURRENT THERAPY: Surveillance   BRIEF ONCOLOGIC HISTORY:     Oncology History  History of ovarian cancer  06/02/2014 Procedure    Diagnostic laparoscopy, lysis of adhesions, biopsy of omental nodules, biopsy right ovary by Dr Abigail Wagner    06/04/2014 Pathology Results    Diagnosis 1. Omentum, biopsy, Anterior peritoneal & omental nodule - SEROUS CARCINOMA WITH ASSOCIATED ABUNDANT PSAMMOMA BODIES AND DESMOPLASTIC STROMAL REACTION CONSISTENT WITH INVASIVE IMPLANTS. - SEE COMMENT. 2. Omentum, biopsy, Omental nodule - SEROUS CARCINOMA WITH ASSOCIATED ABUNDANT PSAMMOMA BODIES AND DESMOPLASTIC STROMAL REACTION CONSISTENT WITH INVASIVE IMPLANTS. - SEE COMMENT. 3. Ovary, biopsy/wedge resection, Right ovary - SMALL FRAGMENTS OF ATYPICAL PAPILLARY PROLIFERATION WITH PSAMMOMA BODIES CONSISTENT WITH AT LEAST SEROUS BORDERLINE TUMOR.    07/03/2014 Procedure    Exploratory laparotomy, omentectomy, Bilateral salpingo-oophorectomy, inptraperitoneal PORT placement by Dr. Clerance Wagner            07/24/2014 - 12/07/2014 Chemotherapy    Carboplatin/Paclitaxel x 6 cycles      08/17/2014 Procedure    Insertion of 8 French power port clear view tunneled venous vascular access device next line use of fluoroscopy for guidance and positioning Use of ultrasound for venipuncture Removal of intraperitoneal chemotherapy port By Dr. Lauralee Wagner    05/14/2017 Genetic Testing    Negative genetic testing on the [facility]  panel.  The [facility] gene panel offered by Abigail Wagner includes sequencing and deletion/duplication testing of the following 28 genes: APC, ATM, BARD1, BMPR1A, BRCA1, BRCA2, BRIP1, CHD1, CDK4, CDKN2A, CHEK2, EPCAM (large rearrangement only), MLH1, MSH2, MSH6, MUTYH, NBN, PALB2, PMS2, PTEN, RAD51C, RAD51D, SMAD4, STK11, and TP53. Sequencing was performed for select regions of POLE and POLD1, and large rearrangement analysis was performed for select regions of GREM1. The report date is April 27, 2017.   HRD testing was performed on the ovarian tumor.  The tumor was negative for any deleterious BRCA1 or BRCA2 mutations.    Genomic instability status was not interpretable.  Therefore, [facility] was unable to analyze the ovarian tumor for genomic instability.  The report date is May 14, 2017.      Anal cancer (HCC)  09/09/2015 Procedure    Rectovaginal septal mass biopsy by Dr. Andy Wagner    09/10/2015 Pathology Results    Soft tissue mass, biopsy, rectovaginal septal mass - SQUAMOUS CELL CARCINOMA.    10/15/2015 PET scan    1. Focal hypermetabolism with associated ill-defined soft tissue fullness at the junction of the anterior anal wall and posterior inferior vaginal wall, in keeping with the provided history of a primary anal carcinoma. 2. No hypermetabolic locoregional or distant metastatic disease. 3. Status post hysterectomy, with no abnormal findings at the vaginal cuff. No evidence of hypermetabolic peritoneal tumor. 4. Tiny nonobstructing bilateral renal stones.    10/25/2015 - 12/06/2015 Radiation Therapy    Abigail Wagner    10/25/2015 - 12/06/2015 Chemotherapy    Mitomycin C and 5FU,  by Dr. Otelia Wagner      06/15/2016 Procedure    EXCISION RECTOVAGINAOL FISTULA WITH MUCOSAL ADVANCEMENT FLAP by Dr. Andy Wagner    06/18/2016 Pathology Results    Fistula, rectovaginal SQUAMOUS, COLUMNAR JUNCTION MUCOSA AND SUBCUTANOUS SOFT TISSUE WITH FISTULA NO EVIDENCE OF MALIGNANCY     09/20/2016 Imaging    CT CAP- 1. No acute findings and no evidence for recurrent tumor or metastatic disease. 2. Aortic atherosclerosis and coronary artery calcification        CANCER STAGING: Cancer Staging Anal cancer (HCC) Staging form: Anus, AJCC 8th Edition - Clinical: Stage IIA (cT2, cN0, cM0) - Signed by Abigail Gant, PA-C on 09/27/2016      History of Present Illness  10/31/2023  Pulmonary/ 1st Wagner eval/ Abigail Wagner / Abigail Wagner  Chief Complaint  Patient presents with   Consult  Dyspnea:  more dizzy than sob when walking no longer shopping  Cough: yellowish minimal in am/ never hemoptysis  Sleep: bed is flat / L side down always one pillow  SABA use: none 02: none  Rec Please remember to go to the lab department   for your tests - we will call you with the results when they are available. My Wagner will be contacting you by phone for referral  PET scanning > done 11/15/23 and inconclusive - some areas improved, lingula worse Please call in meantime if respiratory condition deteriorates    01/02/2024  f/u ov/ Wagner/Abigail Wagner re: pulmonary infiltrates/ still smoking  maint on amiodarone x years   Chief Complaint  Patient presents with   Follow-up    Discuss results of Ct scan   Dyspnea:  limited by strength more than breathing - husband says waddle walking but still working in yard pulling weeds on 12/29/23 x 2 hours  Cough: some first thing in am > all clear mucus Sleeping: flat bed L side down x 40 years  s resp cc  SABA use: none  02: none     No obvious day to day or daytime variability or assoc excess/ purulent sputum or mucus plugs or hemoptysis or cp or chest tightness, subjective wheeze or overt sinus or hb symptoms.    Also denies any obvious fluctuation of symptoms with weather or environmental changes or other aggravating or alleviating factors except as outlined above   No unusual exposure hx or h/o childhood pna/ asthma or knowledge of  premature birth.  Current Allergies, Complete Past Medical History, Past Surgical History, Family History, and Social History were reviewed in [employer] record.  ROS  The following are not active complaints unless bolded Hoarseness, sore throat, dysphagia, dental problems, itching, sneezing,  nasal congestion or discharge of excess mucus or purulent secretions, ear ache,   fever, chills, sweats, unintended wt loss or wt gain, classically pleuritic or exertional cp,  orthopnea pnd or arm/hand swelling  or leg swelling, presyncope, palpitations, abdominal pain, anorexia, nausea, vomiting, diarrhea  or change in bowel habits or change in bladder habits, change in stools or change in urine, dysuria, hematuria,  rash, arthralgias, visual complaints, headache, numbness, weakness or ataxia or problems with walking or coordination,  change in mood or  memory.        Current Meds  Medication Sig   amiodarone (PACERONE) 200 MG tablet TAKE 1 TABLET BY MOUTH EVERY DAY   apixaban (ELIQUIS) 5 MG TABS tablet Take 1 tablet (5 mg total) by mouth 2 (two) times daily.   Aspirin-Salicylamide-Caffeine (BC HEADACHE POWDER PO)  Take 1-2 Packages by mouth daily at 12 noon.   Cholecalciferol (VITAMIN D-3) 125 MCG (5000 UT) TABS Take 5,000 Units by mouth daily.   clotrimazole (LOTRIMIN) 1 % cream Apply 1 application topically 2 (two) times daily as needed (itching around ankles).   Coenzyme Q10 (CO Q 10) 100 MG CAPS Take 100 mg by mouth daily.   colestipol (COLESTID) 1 g tablet Take 2 tablets (2 g total) by mouth daily.   divalproex (DEPAKOTE ER) 500 MG 24 hr tablet Take 1 tablet (500 mg total) by mouth daily.   escitalopram (LEXAPRO) 20 MG tablet Take 1 tablet (20 mg total) by mouth daily.   estradiol (ESTRACE) 0.1 MG/GM vaginal cream Apply a pea size amount of cream to urethral area of vagina twice weekly   Ferrous Gluconate (IRON 27 PO) Take 65 mg by mouth daily. Pt is taking 65mg /325 daily    lipase/protease/amylase (CREON) 36000 UNITS CPEP capsule Take 2 capsules (72,000 Units total) by mouth 3 (three) times daily before meals. Two capsules with meals and one with snacks.   LORazepam (ATIVAN) 0.5 MG tablet Take 1 tablet (0.5 mg total) by mouth 3 (three) times daily as needed for anxiety.   Multiple Vitamin (MULTIVITAMIN) tablet Take 1 tablet by mouth daily.   Multiple Vitamins-Minerals (HAIR SKIN & NAILS ADVANCED PO) Take 3 tablets by mouth daily.   omeprazole (PRILOSEC) 40 MG capsule TAKE 1 CAPSULE (40 MG TOTAL) BY MOUTH DAILY.   OVER THE COUNTER MEDICATION LYSENE 1 daily per husband   oxyCODONE-acetaminophen (PERCOCET) 7.5-325 MG tablet Take 1 tablet by mouth every 6 (six) hours as needed for severe pain (pain score 7-10). This script should last 30 days.  60 per month   polyethylene glycol (MIRALAX / GLYCOLAX) 17 g packet Take 17 g by mouth daily.   rosuvastatin (CRESTOR) 20 MG tablet TAKE 1 TABLET BY MOUTH EVERY DAY           Past Medical History:  Diagnosis Date   Allergy    Anal carcinoma (HCC)    Chemo and radiation   Anemia    Anxiety    Dr. [name] at [place] (psychiatrist).     Blood transfusion without reported diagnosis 2017   Carotid artery disease (HCC)    Chronic kidney disease    DDD (degenerative disc disease), lumbosacral    GERD (gastroesophageal reflux disease)    Headache(784.0)    History of adenomatous polyp of colon 10/07/2015   Tubular adenoma high grade dysplasia   History of herpes genitalis    History of kidney stones    History of suicide attempt 2010   HTN (hypertension)    Hyperlipidemia    IBS (irritable bowel syndrome)    Major depression    OA (osteoarthritis)    Ovarian cancer (HCC)    Stage IIIB papillary ovarian carcinoma  s/p omentectomy and BSO & chemotherapy (completed 12-07-2014)   Paroxysmal atrial fibrillation (HCC)    Prolapse of vaginal vault after hysterectomy 07/20/2015   Rectovaginal fistula 08/05/2015   Seasonal  allergies    Sigmoid diverticulosis    Smokers' cough (HCC)       Objective:     wts  01/02/2024          114  10/31/23 104 lb 9.6 oz (47.4 kg)  10/04/23 103 lb 12.8 oz (47.1 kg)  09/18/23 105 lb (47.6 kg)   10/30/22             118   Vital  signs reviewed  01/02/2024  - Note at rest 02 sats  93% on RA   General appearance:    much more robust appearing than last ov     HEENT : Oropharynx  clear         NECK :  without  apparent JVD/ palpable Nodes/TM    LUNGS: no acc muscle use,  Nl contour chest with a  few insp squeaks L base no bronchial BS    CV:  RRR  no s3 or murmur or increase in P2, and no edema   ABD:  soft and nontender   MS:  Gait slt "waddle" slow pace  ext warm without deformities Or obvious joint restrictions  calf tenderness, cyanosis or clubbing    SKIN: warm and dry without lesions    NEURO:  alert, approp, nl sensorium with  no motor or cerebellar deficits apparent.    CXR PA and Lateral:   01/02/2024 :    I personally reviewed images and impression is as follows:     Compared to prior PET-CT and chest CT there is persistent though improved multifocal infection. Surveillance until resolution is recommended.    Assessment

## 2024-01-01 ENCOUNTER — Encounter: Payer: Self-pay | Admitting: Family Medicine

## 2024-01-01 ENCOUNTER — Ambulatory Visit: Admitting: Family Medicine

## 2024-01-01 VITALS — BP 126/70 | HR 73 | Temp 97.6°F | Ht 65.0 in | Wt 115.6 lb

## 2024-01-01 DIAGNOSIS — Z85048 Personal history of other malignant neoplasm of rectum, rectosigmoid junction, and anus: Secondary | ICD-10-CM | POA: Diagnosis not present

## 2024-01-01 DIAGNOSIS — R918 Other nonspecific abnormal finding of lung field: Secondary | ICD-10-CM | POA: Diagnosis not present

## 2024-01-01 DIAGNOSIS — E876 Hypokalemia: Secondary | ICD-10-CM

## 2024-01-01 DIAGNOSIS — Z8543 Personal history of malignant neoplasm of ovary: Secondary | ICD-10-CM | POA: Diagnosis not present

## 2024-01-01 DIAGNOSIS — D649 Anemia, unspecified: Secondary | ICD-10-CM | POA: Diagnosis not present

## 2024-01-01 NOTE — Progress Notes (Signed)
 Subjective:    Patient ID: Abigail Wagner, female    DOB: 02/23/1951, 73 y.o.   MRN: [id number]  HPI  Patient is a 73 year old Caucasian female with a history of ovarian cancer stage III as well as anal cancer. March 2024, I performed a CT scan of the lungs to screen for lung cancer.  This showed a possible pneumonia.  She was treated with antibiotics appropriately however she never followed up or repeated the chest x-ray.  Repeat CT 12/24 showed persistent opacities in the lungs.  Given the failure of multiple antibiotics I recommended pulmonology consultation for possible bronchoscopy to get cultures and to evaluate for malignancy.  Saw pulmonology in February and they ordered PET scan due to concerns about BAC vs boop.  CEA and CA 125 were both elevated.    Pet scan (11/15/23): IMPRESSION: 1. Waxing and waning areas of consolidation in the lungs bilaterally with improvement in the right middle and right lower lobes, worsening in the lingula and stability in the left lower lobe. Associated hypermetabolism in the lingula. Findings may be due to an ongoing atypical or fungal infectious process. Consider follow-up CT chest without contrast in 3-4 weeks, after appropriate clinical therapy, as lingular malignancy cannot be definitively excluded. 2. Hypermetabolic peritoneal soft tissue thickening in the right anatomic pelvis, worrisome for peritoneal carcinomatosis related to ovarian cancer.  Recently fell and hit her head in the bathroom.  I week later had to go to ER due to uncontrolled bleeding and required five staples and figure 8 suture.  Labs were significant for potassium of 2.3 and hgb of 8.9 down from 10.6 in 2/25.    01/01/24 Here for follow up.  Bleeding has stopped and suture is in place.  Patient has not heard from her oncologist.  Scheduled to see Dr. Sherene Sires tomorrow.  Has completed five days of potassium.  Has been on iron replacement for 1 year.  Still on eliquis.   Past Medical  History:  Diagnosis Date   Allergy    Anal carcinoma (HCC)    Chemo and radiation   Anemia    Anxiety    Dr. Tenny Craw at [place] (psychiatrist).     Blood transfusion without reported diagnosis 2017   Carotid artery disease (HCC)    Chronic kidney disease    DDD (degenerative disc disease), lumbosacral    GERD (gastroesophageal reflux disease)    Headache(784.0)    History of adenomatous polyp of colon 10/07/2015   Tubular adenoma high grade dysplasia   History of herpes genitalis    History of kidney stones    History of suicide attempt 2010   HTN (hypertension)    Hyperlipidemia    IBS (irritable bowel syndrome)    Major depression    OA (osteoarthritis)    Ovarian cancer (HCC)    Stage IIIB papillary ovarian carcinoma  s/p omentectomy and BSO & chemotherapy (completed 12-07-2014)   Paroxysmal atrial fibrillation (HCC)    Prolapse of vaginal vault after hysterectomy 07/20/2015   Rectovaginal fistula 08/05/2015   Seasonal allergies    Sigmoid diverticulosis    Smokers' cough [facility])     Past Surgical History:  Procedure Laterality Date   BIOPSY  10/12/2020   Procedure: BIOPSY;  Surgeon: Dolores Frame, MD;  [facility];  Service: Gastroenterology;;   BREAST ENHANCEMENT SURGERY  1986   BREAST SURGERY  1986   CESAREAN SECTION  1978   COLONOSCOPY N/A 10/07/2015   Procedure: COLONOSCOPY;  Surgeon:  Malissa Hippo, MD;  [facility]: [facility];  Service: Endoscopy;  Laterality: N/A;  7:30   COLONOSCOPY WITH PROPOFOL N/A 10/12/2020   Procedure: COLONOSCOPY WITH PROPOFOL;  Surgeon: Dolores Frame, MD;  [facility];  Service: Gastroenterology;  Laterality: N/A;  9:00   COSMETIC SURGERY  1986   CYSTOSCOPY N/A 02/27/2022   Procedure: CYSTOSCOPY;  Surgeon: Malen Gauze, MD;  Location: [facility];  Service: Urology;  Laterality: N/A;   CYSTOSCOPY WITH FULGERATION N/A 11/03/2021   Procedure: CYSTOSCOPY WITH FULGERATION;  Surgeon:  Malen Gauze, MD;  Location: [facility];  Service: Urology;  Laterality: N/A;   ESOPHAGOGASTRODUODENOSCOPY (EGD) WITH PROPOFOL N/A 10/12/2020   Procedure: ESOPHAGOGASTRODUODENOSCOPY (EGD) WITH PROPOFOL;  Surgeon: Dolores Frame, MD;  [facility];  Service: Gastroenterology;  Laterality: N/A;   EXPLORATORY LAPAROTOMY/ OMENTECTOMY/  BILATERAL SALPINGOOPHORECTOMY/  PORT-A-CATH PLACEMENT  07-03-2014   [place]   KNEE ARTHROSCOPY Left 09/22/2004   LAPAROSCOPY N/A 06/02/2014   Procedure: DIAGNOSTIC LAPAROSCOPY, OMENTAL BIOPSY, RIGHT OVARY BIOPSY, LYSIS OF ADHESIONS;  Surgeon: Claud Kelp, MD;  Location: [facility];  Service: General;  Laterality: N/A;   MUCOSAL ADVANCEMENT FLAP N/A 06/15/2016   Procedure: EXCISION RECTOVAGINAOL FISTULA WITH MUCOSAL ADVANCEMENT FLAP;  Surgeon: Romie Levee, MD;  Location: [facility];  Service: General;  Laterality: N/A;   PLACEMENT OF SETON N/A 09/09/2015   Procedure: PLACEMENT OF SETON;  Surgeon: Romie Levee, MD;  Location: [facility];  Service: General;  Laterality: N/A;   PORT-A-CATH REMOVAL Right 08/17/2014   Procedure: REMOVAL INTRAPERITONEAL CHEMO PORT;  Surgeon: Claud Kelp, MD;  Location: [facility];  Service: General;  Laterality: Right;   PORTACATH PLACEMENT Right 08/17/2014   Procedure:  PLACE NEW PORT A CATH;  Surgeon: Claud Kelp, MD;  Location:  SURGERY CENTER;  Service: General;  Laterality: Right;   RECTAL BIOPSY N/A 09/09/2015   Procedure: BIOPSY OF RECTOVAGINAL MASS;  Surgeon: Romie Levee, MD;  Location: [facility];  Service: General;  Laterality: N/A;   TRANSURETHRAL RESECTION OF BLADDER TUMOR N/A 02/27/2022   Procedure: TRANSURETHRAL RESECTION OF BLADDER TUMOR (TURBT);  Surgeon: Malen Gauze, MD;  Location: [facility];  Service: Urology;  Laterality: N/A;   TUBAL LIGATION  YRS AGO   VAGINAL HYSTERECTOMY  1981   fibroids   Current  Outpatient Medications on File Prior to Visit  Medication Sig Dispense Refill   amiodarone (PACERONE) 200 MG tablet TAKE 1 TABLET BY MOUTH EVERY DAY 90 tablet 3   apixaban (ELIQUIS) 5 MG TABS tablet Take 1 tablet (5 mg total) by mouth 2 (two) times daily. 180 tablet 1   Aspirin-Salicylamide-Caffeine (BC HEADACHE POWDER PO) Take 1-2 Packages by mouth daily at 12 noon.     Cholecalciferol (VITAMIN D-3) 125 MCG (5000 UT) TABS Take 5,000 Units by mouth daily.     clotrimazole (LOTRIMIN) 1 % cream Apply 1 application topically 2 (two) times daily as needed (itching around ankles).     Coenzyme Q10 (CO Q 10) 100 MG CAPS Take 100 mg by mouth daily.     colestipol (COLESTID) 1 g tablet Take 2 tablets (2 g total) by mouth daily. 180 tablet 1   divalproex (DEPAKOTE ER) 500 MG 24 hr tablet Take 1 tablet (500 mg total) by mouth daily. 90 tablet 3   escitalopram (LEXAPRO) 20 MG tablet Take 1 tablet (20 mg total) by mouth daily. 90 tablet 2   estradiol (ESTRACE)  0.1 MG/GM vaginal cream Apply a pea size amount of cream to urethral area of vagina twice weekly 42.5 g 12   Ferrous Gluconate (IRON 27 PO) Take 65 mg by mouth daily. Pt is taking 65mg /325 daily     Krill Oil 350 MG CAPS Take 350 mg by mouth daily.     lipase/protease/amylase (CREON) 36000 UNITS CPEP capsule Take 2 capsules (72,000 Units total) by mouth 3 (three) times daily before meals. Two capsules with meals and one with snacks. 200 capsule 1   LORazepam (ATIVAN) 0.5 MG tablet Take 1 tablet (0.5 mg total) by mouth 3 (three) times daily as needed for anxiety. 90 tablet 3   Multiple Vitamin (MULTIVITAMIN) tablet Take 1 tablet by mouth daily.     Multiple Vitamins-Minerals (HAIR SKIN & NAILS ADVANCED PO) Take 3 tablets by mouth daily.     omeprazole (PRILOSEC) 40 MG capsule TAKE 1 CAPSULE (40 MG TOTAL) BY MOUTH DAILY. 90 capsule 0   OVER THE COUNTER MEDICATION LYSENE 1 daily per husband     oxyCODONE-acetaminophen (PERCOCET) 7.5-325 MG tablet Take 1  tablet by mouth every 6 (six) hours as needed for severe pain (pain score 7-10). This script should last 30 days.  60 per month 60 tablet 0   polyethylene glycol (MIRALAX / GLYCOLAX) 17 g packet Take 17 g by mouth daily. 30 each 1   potassium chloride SA (KLOR-CON M) 20 MEQ tablet Take 1 tablet (20 mEq total) by mouth daily for 5 days. 5 tablet 0   rosuvastatin (CRESTOR) 20 MG tablet TAKE 1 TABLET BY MOUTH EVERY DAY 90 tablet 3   No current facility-administered medications on file prior to visit.   Allergies  Allergen Reactions   Morphine And Codeine     Delirium with sbo   Social History   Socioeconomic History   Marital status: Married    Spouse name: Aurther Loft   Number of children: 4   Years of education: 12   Highest education level: 12th grade  Occupational History   Occupation: retire    Comment: [employer]  Tobacco Use   Smoking status: Former    Current packs/day: 0.00    Average packs/day: 0.5 packs/day for 15.0 years (7.5 ttl pk-yrs)    Types: Cigarettes    Start date: 02/17/2003    Quit date: 02/16/2018    Years since quitting: 5.8   Smokeless tobacco: Never   Tobacco comments:    half pack a day for 15 yrs  Vaping Use   Vaping status: Never Used  Substance and Sexual Activity   Alcohol use: No   Drug use: No   Sexual activity: Not Currently    Birth control/protection: Surgical    Comment: hyst  Other Topics Concern   Not on file  Social History Narrative   Lives at home with Aurther Loft   Retired [employer]   Married 02/16/1990   Several grandchildren    Social Drivers of Health   Financial Resource Strain: Low Risk  (09/07/2023)   Overall Financial Resource Strain (CARDIA)    Difficulty of Paying Living Expenses: Not hard at all  Food Insecurity: No Food Insecurity (09/07/2023)   Hunger Vital Sign    Worried About Running Out of Food in the Last Year: Never true    Ran Out of Food in the Last Year: Never true  Transportation Needs: No Transportation Needs  (09/07/2023)   PRAPARE - Administrator, Civil Service (Medical): No  Lack of Transportation (Non-Medical): No  Physical Activity: Inactive (09/07/2023)   Exercise Vital Sign    Days of Exercise per Week: 0 days    Minutes of Exercise per Session: 20 min  Stress: Stress Concern Present (09/07/2023)   [employer] of Occupational Health - Occupational Stress Questionnaire    Feeling of Stress : To some extent  Social Connections: Moderately Integrated (09/07/2023)   Social Connection and Isolation Panel [NHANES]    Frequency of Communication with Friends and Family: More than three times a week    Frequency of Social Gatherings with Friends and Family: Once a week    Attends Religious Services: 1 to 4 times per year    Active Member of [employer] or Organizations: No    Attends Banker Meetings: Never    Marital Status: Married  Catering manager Violence: Not At Risk (02/08/2023)   Humiliation, Afraid, Rape, and Kick questionnaire    Fear of Current or Ex-Partner: No    Emotionally Abused: No    Physically Abused: No    Sexually Abused: No   Family History  Problem Relation Age of Onset   Bipolar disorder Mother    Anxiety disorder Mother    Dementia Mother    Depression Mother    Mental illness Mother    Vision loss Mother    Alzheimer's disease Mother    Alcohol abuse Paternal Uncle    Bipolar disorder Maternal Grandmother    Dementia Maternal Grandmother    Alzheimer's disease Maternal Grandmother    Alcohol abuse Paternal Uncle    Stroke Brother    Deep vein thrombosis Son    Heart disease Father    Hyperlipidemia Father    Hypertension Father    Stroke Father    Vision loss Father    Atrial fibrillation Sister    Heart disease Brother    Heart disease Brother    ADD / ADHD Neg Hx    Drug abuse Neg Hx    OCD Neg Hx    Paranoid behavior Neg Hx    Schizophrenia Neg Hx    Seizures Neg Hx    Sexual abuse Neg Hx    Physical abuse Neg Hx        Review of Systems     Objective:   Physical Exam Vitals reviewed.  Constitutional:      General: She is not in acute distress.    Appearance: She is not ill-appearing or diaphoretic.  Cardiovascular:     Rate and Rhythm: Normal rate and regular rhythm.     Heart sounds: Normal heart sounds. No murmur heard.    No friction rub. No gallop.  Pulmonary:     Effort: Pulmonary effort is normal. No respiratory distress.     Breath sounds: No stridor. Rhonchi and rales present. No wheezing.    Abdominal:     General: Abdomen is flat. Bowel sounds are normal. There is no distension.     Palpations: Abdomen is soft. There is no mass.     Tenderness: There is no abdominal tenderness.  Musculoskeletal:     Right upper leg: No deformity.     Left upper leg: No deformity.     Right lower leg: No edema.     Left lower leg: No edema.  Neurological:     Mental Status: She is alert.         Assessment & Plan:  Abnormal CT scan of lung - Plan: CBC with Differential/Platelet,  Vitamin B12, Iron, COMPLETE METABOLIC PANEL WITHOUT GFR, Magnesium  Anemia, unspecified type - Plan: CBC with Differential/Platelet, Vitamin B12, Iron, COMPLETE METABOLIC PANEL WITHOUT GFR, Magnesium  History of anal cancer  History of ovarian cancer  Hypokalemia - Plan: CBC with Differential/Platelet, Vitamin B12, Iron, COMPLETE METABOLIC PANEL WITHOUT GFR, Magnesium I suspect cancer recurrence with possible metastatic spread.  Schedule follow up with oncology.  Await consult with Dr. Sherene Sires tomorrow.  Likely needs bronchoscopy and biopsy.  Recheck potassium today and decide about continued potassium replacement.  Check magnesium.  Resume zenpep as I suspect diarrhea is cause for low potassium.   Return for suture removal on Monday.  Recheck hgb and iron level.  If iron is low, may require iv iron.

## 2024-01-02 ENCOUNTER — Ambulatory Visit (HOSPITAL_COMMUNITY)
Admission: RE | Admit: 2024-01-02 | Discharge: 2024-01-02 | Disposition: A | Discharge status: Home / Self Care | Source: Ambulatory Visit | Attending: Internal Medicine | Admitting: Internal Medicine

## 2024-01-02 ENCOUNTER — Encounter: Payer: Self-pay | Admitting: Internal Medicine

## 2024-01-02 ENCOUNTER — Ambulatory Visit: Admitting: Internal Medicine

## 2024-01-02 VITALS — BP 111/65 | HR 71 | Wt 114.0 lb

## 2024-01-02 DIAGNOSIS — R918 Other nonspecific abnormal finding of lung field: Secondary | ICD-10-CM | POA: Insufficient documentation

## 2024-01-02 DIAGNOSIS — F17210 Nicotine dependence, cigarettes, uncomplicated: Secondary | ICD-10-CM | POA: Diagnosis not present

## 2024-01-02 LAB — COMPLETE METABOLIC PANEL WITHOUT GFR
AG Ratio: 1.3 (calc) (ref 1.0–2.5)
ALT: 14 U/L (ref 6–29)
AST: 22 U/L (ref 10–35)
Albumin: 3.2 g/dL — ABNORMAL LOW (ref 3.6–5.1)
Alkaline phosphatase (APISO): 44 U/L (ref 37–153)
BUN: 13 mg/dL (ref 7–25)
CO2: 30 mmol/L (ref 20–32)
Calcium: 8.2 mg/dL — ABNORMAL LOW (ref 8.6–10.4)
Chloride: 107 mmol/L (ref 98–110)
Creat: 0.91 mg/dL (ref 0.60–1.00)
Globulin: 2.4 g/dL (ref 1.9–3.7)
Glucose, Bld: 85 mg/dL (ref 65–99)
Potassium: 4 mmol/L (ref 3.5–5.3)
Sodium: 144 mmol/L (ref 135–146)
Total Bilirubin: 0.2 mg/dL (ref 0.2–1.2)
Total Protein: 5.6 g/dL — ABNORMAL LOW (ref 6.1–8.1)

## 2024-01-02 LAB — IRON: Iron: 64 ug/dL (ref 45–160)

## 2024-01-02 LAB — CBC WITH DIFFERENTIAL/PLATELET
Absolute Lymphocytes: 975 {cells}/uL (ref 850–3900)
Absolute Monocytes: 368 {cells}/uL (ref 200–950)
Basophils Absolute: 41 {cells}/uL (ref 0–200)
Basophils Relative: 0.9 %
Eosinophils Absolute: 110 {cells}/uL (ref 15–500)
Eosinophils Relative: 2.4 %
HCT: 28.2 % — ABNORMAL LOW (ref 35.0–45.0)
Hemoglobin: 9.2 g/dL — ABNORMAL LOW (ref 11.7–15.5)
MCH: 31.1 pg (ref 27.0–33.0)
MCHC: 32.6 g/dL (ref 32.0–36.0)
MCV: 95.3 fL (ref 80.0–100.0)
MPV: 10.6 fL (ref 7.5–12.5)
Monocytes Relative: 8 %
Neutro Abs: 3105 {cells}/uL (ref 1500–7800)
Neutrophils Relative %: 67.5 %
Platelets: 208 10*3/uL (ref 140–400)
RBC: 2.96 10*6/uL — ABNORMAL LOW (ref 3.80–5.10)
RDW: 14.5 % (ref 11.0–15.0)
Total Lymphocyte: 21.2 %
WBC: 4.6 10*3/uL (ref 3.8–10.8)

## 2024-01-02 LAB — MAGNESIUM: Magnesium: 1.9 mg/dL (ref 1.5–2.5)

## 2024-01-02 LAB — VITAMIN B12: Vitamin B-12: 556 pg/mL (ref 200–1100)

## 2024-01-02 NOTE — Assessment & Plan Note (Addendum)
 Counseled re importance of smoking cessation but did not meet time criteria for separate billing           Each maintenance medication was reviewed in detail including emphasizing most importantly the difference between maintenance and prns and under what circumstances the prns are to be triggered using an action plan format where appropriate.  Total time for H and P, chart review, counseling,  directly observing portions of ambulatory 02 saturation study/ and generating customized AVS unique to this office visit / same day charting = 30 min summary f/u ov

## 2024-01-02 NOTE — Patient Instructions (Addendum)
 The key is to stop smoking completely before smoking completely stops you!    Please remember to go to the  x-ray department  @  [facility] for your tests - we will call you with the results when they are available   and decide whether a lung biopsy is needed after consulting with Dr Kirtland Bouchard next week   Please schedule a follow up visit in 3 months but call sooner if needed

## 2024-01-06 ENCOUNTER — Other Ambulatory Visit: Payer: Self-pay | Admitting: Family Medicine

## 2024-01-06 DIAGNOSIS — I4891 Unspecified atrial fibrillation: Secondary | ICD-10-CM

## 2024-01-06 NOTE — Assessment & Plan Note (Addendum)
 Active smoker - onset of cough spring 2024 with persistent air bronchograms on CT first seen in abd scan done 12/14/22  - CT 08/30/23 : Very extensive consolidation and most likely some degree of fibrosis with associated volume loss in the dependent left lower lobe and posterior lingula, similar to prior examination dated 12/20/2022 (series 3, image 118). New consolidation and scattered heterogeneous opacities in the medial right lower lobe (series 3, image 134) as well as in the medial right upper lobe (series 3, image 80). Unchanged biapical pleural-parenchymal scarring. Small incidental left-sided Bochdalek's hernia.  - 10/31/2023   Walked on RA  x  2  lap(s) =  approx 300  ft @ very slow pace , gait was unsteady , pt was dizzy , she became tried on the 2nd lap with lowest sats 95%  - Labs 10/30/22 with serially  elevated pelvic tumor markers  -  01/02/2024   Walked on  RA  x  2  lap(s) =  approx 300  ft  @ slow pace, stopped due to felt tired / no sob  with lowest 02 sats 90%      Lab Results  Component Value Date   ESRSEDRATE 14 10/31/2023   ESRSEDRATE 8 01/25/2017   ESRSEDRATE 1 04/15/2014    She looks a lot better today and is more tired than dyspneic walking with improvement on cxr c/w inflammatory changes for which amiodarone no longer a suspect since still on it.   Rec no change in rx needed for now from my perspective  F/u q3 m sooner prn

## 2024-01-07 ENCOUNTER — Inpatient Hospital Stay

## 2024-01-07 ENCOUNTER — Ambulatory Visit: Admitting: Family Medicine

## 2024-01-07 ENCOUNTER — Ambulatory Visit (INDEPENDENT_AMBULATORY_CARE_PROVIDER_SITE_OTHER): Payer: Medicare Other | Admitting: Gastroenterology

## 2024-01-07 ENCOUNTER — Inpatient Hospital Stay: Admitting: Hematology

## 2024-01-07 ENCOUNTER — Encounter: Payer: Self-pay | Admitting: Family Medicine

## 2024-01-07 VITALS — BP 120/70 | HR 72 | Temp 97.9°F | Ht 65.0 in | Wt 111.0 lb

## 2024-01-07 DIAGNOSIS — Z4802 Encounter for removal of sutures: Secondary | ICD-10-CM

## 2024-01-07 NOTE — Progress Notes (Signed)
 Subjective:    Patient ID: Abigail Wagner, female    DOB: [birth year], 73 y.o.   MRN: [id number]  HPI  Patient is a 73 year old Caucasian female with a history of ovarian cancer stage III as well as anal cancer. March 2024, I performed a CT scan of the lungs to screen for lung cancer.  This showed a possible pneumonia.  She was treated with antibiotics appropriately however she never followed up or repeated the chest x-ray.  Repeat CT 12/24 showed persistent opacities in the lungs.  Given the failure of multiple antibiotics I recommended pulmonology consultation for possible bronchoscopy to get cultures and to evaluate for malignancy.  Saw pulmonology in February and they ordered PET scan due to concerns about BAC vs boop.  CEA and CA 125 were both elevated.    Pet scan (11/15/23): IMPRESSION: 1. Waxing and waning areas of consolidation in the lungs bilaterally with improvement in the right middle and right lower lobes, worsening in the lingula and stability in the left lower lobe. Associated hypermetabolism in the lingula. Findings may be due to an ongoing atypical or fungal infectious process. Consider follow-up CT chest without contrast in 3-4 weeks, after appropriate clinical therapy, as lingular malignancy cannot be definitively excluded. 2. Hypermetabolic peritoneal soft tissue thickening in the right anatomic pelvis, worrisome for peritoneal carcinomatosis related to ovarian cancer.  Recently fell and hit her head in the bathroom.  I week later had to go to ER due to uncontrolled bleeding and required five staples and figure 8 suture.  Labs were significant for potassium of 2.3 and hgb of 8.9 down from 10.6 in 2/25.    01/01/24 Here for follow up.  Bleeding has stopped and suture is in place.  Patient has not heard from her oncologist.  Scheduled to see Dr. Sherene Sires tomorrow.  Has completed five days of potassium.  Has been on iron replacement for 1 year.  Still on eliquis.  At that time, my  plan was: I suspect cancer recurrence with possible metastatic spread.  Schedule follow up with oncology.  Await consult with Dr. Sherene Sires tomorrow.  Likely needs bronchoscopy and biopsy.  Recheck potassium today and decide about continued potassium replacement.  Check magnesium.  Resume zenpep as I suspect diarrhea is cause for low potassium.   Return for suture removal on Monday.  Recheck hgb and iron level.  If iron is low, may require iv iron.    01/07/24 Patient has a 1.5 cm subcutaneous hematoma on the right parietal scalp.  There is a blue Prolene suture visible on the surface.  I cut this with a pair of scissors and was able to slowly extract the suture without any residual bleeding. Past Medical History:  Diagnosis Date   Allergy    Anal carcinoma (HCC)    Chemo and radiation   Anemia    Anxiety    Dr. Tenny Craw at [place] (psychiatrist).     Blood transfusion without reported diagnosis 2017   Carotid artery disease (HCC)    Chronic kidney disease    DDD (degenerative disc disease), lumbosacral    GERD (gastroesophageal reflux disease)    Headache(784.0)    History of adenomatous polyp of colon 10/07/2015   Tubular adenoma high grade dysplasia   History of herpes genitalis    History of kidney stones    History of suicide attempt 2010   HTN (hypertension)    Hyperlipidemia    IBS (irritable bowel syndrome)    Major depression  OA (osteoarthritis)    Ovarian cancer (HCC)    Stage IIIB papillary ovarian carcinoma  s/p omentectomy and BSO & chemotherapy (completed 12-07-2014)   Paroxysmal atrial fibrillation (HCC)    Prolapse of vaginal vault after hysterectomy 07/20/2015   Rectovaginal fistula 08/05/2015   Seasonal allergies    Sigmoid diverticulosis    Smokers' cough [facility]     Past Surgical History:  Procedure Laterality Date   BIOPSY  10/12/2020   Procedure: BIOPSY;  Surgeon: Urban Garden, MD;  [facility];  Service: Gastroenterology;;   BREAST  ENHANCEMENT SURGERY  1986   BREAST SURGERY  1986   CESAREAN SECTION  1978   COLONOSCOPY N/A 10/07/2015   Procedure: COLONOSCOPY;  Surgeon: Ruby Corporal, MD;  [facility];  Service: Endoscopy;  Laterality: N/A;  7:30   COLONOSCOPY WITH PROPOFOL N/A 10/12/2020   Procedure: COLONOSCOPY WITH PROPOFOL;  Surgeon: Urban Garden, MD;  [facility];  Service: Gastroenterology;  Laterality: N/A;  9:00   COSMETIC SURGERY  1986   CYSTOSCOPY N/A 02/27/2022   Procedure: CYSTOSCOPY;  Surgeon: Marco Severs, MD;  Location: [facility];  Service: Urology;  Laterality: N/A;   CYSTOSCOPY WITH FULGERATION N/A 11/03/2021   Procedure: CYSTOSCOPY WITH FULGERATION;  Surgeon: Marco Severs, MD;  Location: [facility];  Service: Urology;  Laterality: N/A;   ESOPHAGOGASTRODUODENOSCOPY (EGD) WITH PROPOFOL N/A 10/12/2020   Procedure: ESOPHAGOGASTRODUODENOSCOPY (EGD) WITH PROPOFOL;  Surgeon: Urban Garden, MD;  [facility];  Service: Gastroenterology;  Laterality: N/A;   EXPLORATORY LAPAROTOMY/ OMENTECTOMY/  BILATERAL SALPINGOOPHORECTOMY/  PORT-A-CATH PLACEMENT  07-03-2014   [place]   KNEE ARTHROSCOPY Left 09/22/2004   LAPAROSCOPY N/A 06/02/2014   Procedure: DIAGNOSTIC LAPAROSCOPY, OMENTAL BIOPSY, RIGHT OVARY BIOPSY, LYSIS OF ADHESIONS;  Surgeon: Boyce Byes, MD;  Location: [facility];  Service: General;  Laterality: N/A;   MUCOSAL ADVANCEMENT FLAP N/A 06/15/2016   Procedure: EXCISION RECTOVAGINAOL FISTULA WITH MUCOSAL ADVANCEMENT FLAP;  Surgeon: Joyce Nixon, MD;  Location: [facility];  Service: General;  Laterality: N/A;   PLACEMENT OF SETON N/A 09/09/2015   Procedure: PLACEMENT OF SETON;  Surgeon: Joyce Nixon, MD;  Location: [facility];  Service: General;  Laterality: N/A;   PORT-A-CATH REMOVAL Right 08/17/2014   Procedure: REMOVAL INTRAPERITONEAL CHEMO PORT;  Surgeon: Boyce Byes, MD;  Location: [facility];  Service: General;  Laterality: Right;   PORTACATH PLACEMENT Right 08/17/2014   Procedure:  PLACE NEW PORT A CATH;  Surgeon: Boyce Byes, MD;  Location:  SURGERY CENTER;  Service: General;  Laterality: Right;   RECTAL BIOPSY N/A 09/09/2015   Procedure: BIOPSY OF RECTOVAGINAL MASS;  Surgeon: Joyce Nixon, MD;  Location: [facility];  Service: General;  Laterality: N/A;   TRANSURETHRAL RESECTION OF BLADDER TUMOR N/A 02/27/2022   Procedure: TRANSURETHRAL RESECTION OF BLADDER TUMOR (TURBT);  Surgeon: Marco Severs, MD;  Location: [facility];  Service: Urology;  Laterality: N/A;   TUBAL LIGATION  YRS AGO   VAGINAL HYSTERECTOMY  1981   fibroids   Current Outpatient Medications on File Prior to Visit  Medication Sig Dispense Refill   amiodarone (PACERONE) 200 MG tablet TAKE 1 TABLET BY MOUTH EVERY DAY 90 tablet 3   apixaban (ELIQUIS) 5 MG TABS tablet Take 1 tablet (5 mg total) by mouth 2 (two) times daily. 180 tablet 1   Aspirin-Salicylamide-Caffeine (BC HEADACHE POWDER PO) Take 1-2 Packages by mouth daily at 12  noon.     Cholecalciferol (VITAMIN D-3) 125 MCG (5000 UT) TABS Take 5,000 Units by mouth daily.     clotrimazole (LOTRIMIN) 1 % cream Apply 1 application topically 2 (two) times daily as needed (itching around ankles).     Coenzyme Q10 (CO Q 10) 100 MG CAPS Take 100 mg by mouth daily.     colestipol (COLESTID) 1 g tablet Take 2 tablets (2 g total) by mouth daily. 180 tablet 1   divalproex (DEPAKOTE ER) 500 MG 24 hr tablet Take 1 tablet (500 mg total) by mouth daily. 90 tablet 3   escitalopram (LEXAPRO) 20 MG tablet Take 1 tablet (20 mg total) by mouth daily. 90 tablet 2   estradiol (ESTRACE) 0.1 MG/GM vaginal cream Apply a pea size amount of cream to urethral area of vagina twice weekly 42.5 g 12   Ferrous Gluconate (IRON 27 PO) Take 65 mg by mouth daily. Pt is taking 65mg /325 daily     lipase/protease/amylase (CREON) 36000 UNITS CPEP capsule Take 2  capsules (72,000 Units total) by mouth 3 (three) times daily before meals. Two capsules with meals and one with snacks. 200 capsule 1   LORazepam (ATIVAN) 0.5 MG tablet Take 1 tablet (0.5 mg total) by mouth 3 (three) times daily as needed for anxiety. 90 tablet 3   Multiple Vitamin (MULTIVITAMIN) tablet Take 1 tablet by mouth daily.     Multiple Vitamins-Minerals (HAIR SKIN & NAILS ADVANCED PO) Take 3 tablets by mouth daily.     omeprazole (PRILOSEC) 40 MG capsule TAKE 1 CAPSULE (40 MG TOTAL) BY MOUTH DAILY. 90 capsule 0   OVER THE COUNTER MEDICATION LYSENE 1 daily per husband     oxyCODONE-acetaminophen (PERCOCET) 7.5-325 MG tablet Take 1 tablet by mouth every 6 (six) hours as needed for severe pain (pain score 7-10). This script should last 30 days.  60 per month 60 tablet 0   rosuvastatin (CRESTOR) 20 MG tablet TAKE 1 TABLET BY MOUTH EVERY DAY 90 tablet 3   potassium chloride SA (KLOR-CON M) 20 MEQ tablet Take 1 tablet (20 mEq total) by mouth daily for 5 days. (Patient not taking: Reported on 01/02/2024) 5 tablet 0   No current facility-administered medications on file prior to visit.   Allergies  Allergen Reactions   Morphine And Codeine     Delirium with sbo   Social History   Socioeconomic History   Marital status: Married    Spouse name: Aurther Loft   Number of children: 4   Years of education: 12   Highest education level: 12th grade  Occupational History   Occupation: retire    Comment: [employer]  Tobacco Use   Smoking status: Former    Current packs/day: 0.00    Average packs/day: 0.5 packs/day for 15.0 years (7.5 ttl pk-yrs)    Types: Cigarettes    Start date: 02/17/2003    Quit date: 02/16/2018    Years since quitting: 5.8   Smokeless tobacco: Never   Tobacco comments:    half pack a day for 15 yrs  Vaping Use   Vaping status: Never Used  Substance and Sexual Activity   Alcohol use: No   Drug use: No   Sexual activity: Not Currently    Birth control/protection:  Surgical    Comment: hyst  Other Topics Concern   Not on file  Social History Narrative   Lives at home with Aurther Loft   Retired [employer]   Married 02/16/1990   Several grandchildren  Social Drivers of Corporate investment banker Strain: Low Risk  (09/07/2023)   Overall Financial Resource Strain (CARDIA)    Difficulty of Paying Living Expenses: Not hard at all  Food Insecurity: No Food Insecurity (09/07/2023)   Hunger Vital Sign    Worried About Running Out of Food in the Last Year: Never true    Ran Out of Food in the Last Year: Never true  Transportation Needs: No Transportation Needs (09/07/2023)   PRAPARE - Administrator, Civil Service (Medical): No    Lack of Transportation (Non-Medical): No  Physical Activity: Inactive (09/07/2023)   Exercise Vital Sign    Days of Exercise per Week: 0 days    Minutes of Exercise per Session: 20 min  Stress: Stress Concern Present (09/07/2023)   [employer] of Occupational Health - Occupational Stress Questionnaire    Feeling of Stress : To some extent  Social Connections: Moderately Integrated (09/07/2023)   Social Connection and Isolation Panel [NHANES]    Frequency of Communication with Friends and Family: More than three times a week    Frequency of Social Gatherings with Friends and Family: Once a week    Attends Religious Services: 1 to 4 times per year    Active Member of [employer] or Organizations: No    Attends Banker Meetings: Never    Marital Status: Married  Catering manager Violence: Not At Risk (02/08/2023)   Humiliation, Afraid, Rape, and Kick questionnaire    Fear of Current or Ex-Partner: No    Emotionally Abused: No    Physically Abused: No    Sexually Abused: No   Family History  Problem Relation Age of Onset   Bipolar disorder Mother    Anxiety disorder Mother    Dementia Mother    Depression Mother    Mental illness Mother    Vision loss Mother    Alzheimer's disease Mother     Alcohol abuse Paternal Uncle    Bipolar disorder Maternal Grandmother    Dementia Maternal Grandmother    Alzheimer's disease Maternal Grandmother    Alcohol abuse Paternal Uncle    Stroke Brother    Deep vein thrombosis Son    Heart disease Father    Hyperlipidemia Father    Hypertension Father    Stroke Father    Vision loss Father    Atrial fibrillation Sister    Heart disease Brother    Heart disease Brother    ADD / ADHD Neg Hx    Drug abuse Neg Hx    OCD Neg Hx    Paranoid behavior Neg Hx    Schizophrenia Neg Hx    Seizures Neg Hx    Sexual abuse Neg Hx    Physical abuse Neg Hx       Review of Systems     Objective:   Physical Exam Vitals reviewed.  Constitutional:      General: She is not in acute distress.    Appearance: She is not ill-appearing or diaphoretic.  HENT:     Head:   Cardiovascular:     Rate and Rhythm: Normal rate and regular rhythm.     Heart sounds: Normal heart sounds. No murmur heard.    No friction rub. No gallop.  Pulmonary:     Effort: Pulmonary effort is normal. No respiratory distress.     Breath sounds: No stridor. Rhonchi and rales present. No wheezing.    Abdominal:     General: Abdomen  is flat. Bowel sounds are normal. There is no distension.     Palpations: Abdomen is soft. There is no mass.     Tenderness: There is no abdominal tenderness.  Musculoskeletal:     Right upper leg: No deformity.     Left upper leg: No deformity.     Right lower leg: No edema.     Left lower leg: No edema.  Neurological:     Mental Status: She is alert.         Assessment & Plan:  Visit for suture removal I removed the suture.  There is 2 cm hematoma that is still soft and spongy which I believe is blood.  I try not to manipulate this to avoid rebleeding.  I recommended they apply pressure to this area in case bleeding recurs.

## 2024-01-08 ENCOUNTER — Ambulatory Visit (INDEPENDENT_AMBULATORY_CARE_PROVIDER_SITE_OTHER): Admitting: Gastroenterology

## 2024-01-08 NOTE — Telephone Encounter (Signed)
 Requested medication (s) are due for refill today: yes  Requested medication (s) are on the active medication list: yes  Last refill:  07/13/23 #180 1 RF  Future visit scheduled: no  Notes to clinic:  abnormal lab work    Requested Prescriptions  Pending Prescriptions Disp Refills   ELIQUIS 5 MG TABS tablet [Pharmacy Med Name: ELIQUIS 5 MG TABLET] 180 tablet 1    Sig: TAKE 1 TABLET BY MOUTH TWICE A DAY     Hematology:  Anticoagulants - apixaban Failed - 01/08/2024  9:33 AM      Failed - HGB in normal range and within 360 days    Hemoglobin  Date Value Ref Range Status  01/01/2024 9.2 (L) 11.7 - 15.5 g/dL Final  16/06/9603 54.0 (L) 11.1 - 15.9 g/dL Final         Failed - HCT in normal range and within 360 days    HCT  Date Value Ref Range Status  01/01/2024 28.2 (L) 35.0 - 45.0 % Final   Hematocrit  Date Value Ref Range Status  10/31/2023 32.4 (L) 34.0 - 46.6 % Final         Passed - PLT in normal range and within 360 days    Platelets  Date Value Ref Range Status  01/01/2024 208 140 - 400 Thousand/uL Final  10/31/2023 254 150 - 450 x10E3/uL Final         Passed - Cr in normal range and within 360 days    Creat  Date Value Ref Range Status  01/01/2024 0.91 0.60 - 1.00 mg/dL Final         Passed - AST in normal range and within 360 days    AST  Date Value Ref Range Status  01/01/2024 22 10 - 35 U/L Final         Passed - ALT in normal range and within 360 days    ALT  Date Value Ref Range Status  01/01/2024 14 6 - 29 U/L Final         Passed - Valid encounter within last 12 months    Recent Outpatient Visits           Yesterday Visit for suture removal   [facility] Austine Lefort, MD   1 week ago Abnormal CT scan of lung   [facility] Austine Lefort, MD   3 months ago Chronic diarrhea   [place] [facility] Jenelle Mis, FNP   5 months ago Community acquired  pneumonia, unspecified laterality   [facility] Austine Lefort, MD   1 year ago History of anal cancer   [place] [facility] Pickard, Cisco Crest, MD

## 2024-01-13 ENCOUNTER — Encounter (HOSPITAL_COMMUNITY): Payer: Self-pay | Admitting: Emergency Medicine

## 2024-01-13 ENCOUNTER — Emergency Department (HOSPITAL_COMMUNITY)
Admission: EM | Admit: 2024-01-13 | Discharge: 2024-01-13 | Disposition: A | Discharge status: Home / Self Care | Attending: Emergency Medicine | Admitting: Emergency Medicine

## 2024-01-13 ENCOUNTER — Other Ambulatory Visit: Payer: Self-pay

## 2024-01-13 DIAGNOSIS — T148XXA Other injury of unspecified body region, initial encounter: Secondary | ICD-10-CM

## 2024-01-13 DIAGNOSIS — L7622 Postprocedural hemorrhage and hematoma of skin and subcutaneous tissue following other procedure: Secondary | ICD-10-CM | POA: Insufficient documentation

## 2024-01-13 DIAGNOSIS — Z7901 Long term (current) use of anticoagulants: Secondary | ICD-10-CM | POA: Insufficient documentation

## 2024-01-13 DIAGNOSIS — I4891 Unspecified atrial fibrillation: Secondary | ICD-10-CM | POA: Diagnosis not present

## 2024-01-13 DIAGNOSIS — I10 Essential (primary) hypertension: Secondary | ICD-10-CM | POA: Diagnosis not present

## 2024-01-13 NOTE — ED Triage Notes (Signed)
 Pt arrives via EMS from home c/o wound bleeding. Pt has had this happen several times in the last few weeks.

## 2024-01-13 NOTE — ED Provider Notes (Signed)
 [place] EMERGENCY DEPARTMENT AT [facility] Provider Note   CSN: [id number] Arrival date & time: 01/13/24  0033     History  Chief Complaint  Patient presents with   Wound Check    Abigail Wagner is a 73 y.o. female.  Patient is a 73 year old female with past medical history of atrial fibrillation on Eliquis , hypertension, bipolar.  Patient presenting today for evaluation of bleeding from a head wound.  She apparently hit her head while doing housework 2 weeks ago.  This began bleeding and bleeding was difficult to control.  She had a stitch placed which temporarily helped.  This evening, the area began bleeding again and patient brought by EMS for this.  No new injury or trauma.       Home Medications Prior to Admission medications   Medication Sig Start Date End Date Taking? Authorizing Provider  amiodarone  (PACERONE ) 200 MG tablet TAKE 1 TABLET BY MOUTH EVERY DAY 06/29/23   McDowell, Samuel G, MD  Aspirin-Salicylamide-Caffeine [facility] HEADACHE POWDER PO) Take 1-2 Packages by mouth daily at 12 noon.    [provider]  Cholecalciferol (VITAMIN D -3) 125 MCG (5000 UT) TABS Take 5,000 Units by mouth daily.    [provider]  clotrimazole (LOTRIMIN) 1 % cream Apply 1 application topically 2 (two) times daily as needed (itching around ankles).    [provider]  Coenzyme Q10 (CO Q 10) 100 MG CAPS Take 100 mg by mouth daily.    [provider]  colestipol  (COLESTID ) 1 g tablet Take 2 tablets (2 g total) by mouth daily. 10/04/23   Castaneda Mayorga, Daniel, MD  divalproex  (DEPAKOTE  ER) 500 MG 24 hr tablet Take 1 tablet (500 mg total) by mouth daily. 09/03/23   Alysia Bachelor, MD  ELIQUIS  5 MG TABS tablet TAKE 1 TABLET BY MOUTH TWICE A DAY 01/08/24   Austine Lefort, MD  escitalopram  (LEXAPRO ) 20 MG tablet Take 1 tablet (20 mg total) by mouth daily. 09/03/23   Alysia Bachelor, MD  estradiol  (ESTRACE ) 0.1 MG/GM vaginal cream Apply a pea size  amount of cream to urethral area of vagina twice weekly 03/03/22   Summerlin, Julienne Annette, PA-C  Ferrous Gluconate (IRON  27 PO) Take 65 mg by mouth daily. Pt is taking 65mg /325 daily    [provider]  lipase/protease/amylase (CREON ) 36000 UNITS CPEP capsule Take 2 capsules (72,000 Units total) by mouth 3 (three) times daily before meals. Two capsules with meals and one with snacks. 10/23/23   Urban Garden, MD  LORazepam  (ATIVAN ) 0.5 MG tablet Take 1 tablet (0.5 mg total) by mouth 3 (three) times daily as needed for anxiety. 09/03/23   Alysia Bachelor, MD  Multiple Vitamin (MULTIVITAMIN) tablet Take 1 tablet by mouth daily.    [provider]  Multiple Vitamins-Minerals (HAIR SKIN & NAILS ADVANCED PO) Take 3 tablets by mouth daily.    [provider]  omeprazole  (PRILOSEC) 40 MG capsule TAKE 1 CAPSULE (40 MG TOTAL) BY MOUTH DAILY. 12/20/23   Austine Lefort, MD  OVER THE COUNTER MEDICATION LYSENE 1 daily per husband    [provider]  oxyCODONE -acetaminophen  (PERCOCET) 7.5-325 MG tablet Take 1 tablet by mouth every 6 (six) hours as needed for severe pain (pain score 7-10). This script should last 30 days.  60 per month 12/27/23   Austine Lefort, MD  potassium chloride  SA (KLOR-CON  M) 20 MEQ tablet Take 1 tablet (20 mEq total) by mouth  daily for 5 days. Patient not taking: Reported on 01/02/2024 12/29/23 01/03/24  Merdis Stalling, MD  rosuvastatin  (CRESTOR ) 20 MG tablet TAKE 1 TABLET BY MOUTH EVERY DAY 04/09/23   Austine Lefort, MD      Allergies    Morphine  and codeine    Review of Systems   Review of Systems  All other systems reviewed and are negative.   Physical Exam Updated Vital Signs BP (!) 153/81 (BP Location: Left Arm)   Pulse 63   Temp 98.1 F (36.7 C) (Oral)   Resp 15   Ht 5\' 5"  (1.651 m)   Wt 50.3 kg   SpO2 97%   BMI 18.47 kg/m  Physical Exam Vitals and nursing note reviewed.  Constitutional:      Appearance: Normal  appearance.  HENT:     Head:     Comments: There is a small wound noted to the right posterior scalp that is actively bleeding. Pulmonary:     Effort: Pulmonary effort is normal.  Skin:    General: Skin is warm and dry.  Neurological:     Mental Status: She is alert and oriented to person, place, and time.     ED Results / Procedures / Treatments   Labs (all labs ordered are listed, but only abnormal results are displayed) Labs Reviewed - No data to display  EKG None  Radiology No results found.  Procedures Procedures    Medications Ordered in ED Medications - No data to display  ED Course/ Medical Decision Making/ A&P  Patient presenting here with complaints of bleeding from a scalp wound.  She has been having issues with this on and off since bumping her head 3 weeks ago.  At one point a figure-of-eight suture was placed.  The suture was removed earlier this week, but now bleeding has resumed.  She arrives here with bleeding from a pinpoint area to the scalp.  This was controlled with a quick clot dressing and Coban.  Patient to be discharged with this dressing in place for the next 2 days.  Final Clinical Impression(s) / ED Diagnoses Final diagnoses:  None    Rx / DC Orders ED Discharge Orders     None         Orvilla Blander, MD 01/13/24 [phone]

## 2024-01-13 NOTE — Discharge Instructions (Signed)
 Leave dressing in place for the next 2 days, then carefully remove and reapply another.  Return to the ER for any new and/or concerning issues.

## 2024-01-14 ENCOUNTER — Encounter (INDEPENDENT_AMBULATORY_CARE_PROVIDER_SITE_OTHER): Payer: Self-pay

## 2024-01-14 ENCOUNTER — Ambulatory Visit (INDEPENDENT_AMBULATORY_CARE_PROVIDER_SITE_OTHER): Admitting: Gastroenterology

## 2024-01-18 ENCOUNTER — Ambulatory Visit: Admitting: Family Medicine

## 2024-01-18 ENCOUNTER — Encounter: Payer: Self-pay | Admitting: Family Medicine

## 2024-01-18 VITALS — BP 132/84 | HR 72 | Temp 98.3°F | Ht 65.0 in | Wt 111.5 lb

## 2024-01-18 DIAGNOSIS — S0003XD Contusion of scalp, subsequent encounter: Secondary | ICD-10-CM

## 2024-01-18 NOTE — Progress Notes (Signed)
 Subjective:    Patient ID: Abigail Wagner, female    DOB: [birth year], 73 y.o.   MRN: [id number]  HPI  Patient is a 73 year old Caucasian female with a history of ovarian cancer stage III as well as anal cancer. March 2024, I performed a CT scan of the lungs to screen for lung cancer.  This showed a possible pneumonia.  She was treated with antibiotics appropriately however she never followed up or repeated the chest x-ray.  Repeat CT 12/24 showed persistent opacities in the lungs.  Given the failure of multiple antibiotics I recommended pulmonology consultation for possible bronchoscopy to get cultures and to evaluate for malignancy.  Saw pulmonology in February and they ordered PET scan due to concerns about BAC vs boop.  CEA and CA 125 were both elevated.    Pet scan (11/15/23): IMPRESSION: 1. Waxing and waning areas of consolidation in the lungs bilaterally with improvement in the right middle and right lower lobes, worsening in the lingula and stability in the left lower lobe. Associated hypermetabolism in the lingula. Findings may be due to an ongoing atypical or fungal infectious process. Consider follow-up CT chest without contrast in 3-4 weeks, after appropriate clinical therapy, as lingular malignancy cannot be definitively excluded. 2. Hypermetabolic peritoneal soft tissue thickening in the right anatomic pelvis, worrisome for peritoneal carcinomatosis related to ovarian cancer.  Recently fell and hit her head in the bathroom.  I week later had to go to ER due to uncontrolled bleeding and required five staples and figure 8 suture.  Labs were significant for potassium of 2.3 and hgb of 8.9 down from 10.6 in 2/25.    01/01/24 Here for follow up.  Bleeding has stopped and suture is in place.  Patient has not heard from her oncologist.  Scheduled to see Dr. Waymond Hailey tomorrow.  Has completed five days of potassium.  Has been on iron  replacement for 1 year.  Still on eliquis .  At that time, my  plan was: I suspect cancer recurrence with possible metastatic spread.  Schedule follow up with oncology.  Await consult with Dr. Waymond Hailey tomorrow.  Likely needs bronchoscopy and biopsy.  Recheck potassium today and decide about continued potassium replacement.  Check magnesium .  Resume zenpep  as I suspect diarrhea is cause for low potassium.   Return for suture removal on Monday.  Recheck hgb and iron  level.  If iron  is low, may require iv iron .    01/07/24 Patient has a 1.5 cm subcutaneous hematoma on the right parietal scalp.  There is a blue Prolene suture visible on the surface.  I cut this with a pair of scissors and was able to slowly extract the suture without any residual bleeding.  At that time, my plan was: I removed the suture.  There is 2 cm hematoma that is still soft and spongy which I believe is blood.  I try not to manipulate this to avoid rebleeding.  I recommended they apply pressure to this area in case bleeding recurs.  01/18/24 Patient had to return to the emergency room Sunday due to bleeding from the same hematoma on her right parietal scalp.  They covered it with quick clot and Coban.  The patient has been home now for a week and has not had any residual bleeding.  On the scalp, there is a hematoma that is roughly 2 cm in diameter.  However it is now hard and firm to the touch whereas it was spongy before.  Despite putting firm  pressure around the hematoma, I am unable to express any such fluid or blood or drainage.  There is no erythema or warmth to suggest infection. Past Medical History:  Diagnosis Date   Allergy    Anal carcinoma (HCC)    Chemo and radiation   Anemia    Anxiety 1974   Dr. [name] at [facility] (psychiatrist).     Blood transfusion without reported diagnosis 2017   Carotid artery disease (HCC)    Chronic kidney disease    DDD (degenerative disc disease), lumbosacral    GERD (gastroesophageal reflux disease) 2010   Headache(784.0)    History of adenomatous  polyp of colon 10/07/2015   Tubular adenoma high grade dysplasia   History of herpes genitalis    History of kidney stones    History of suicide attempt 2010   HTN (hypertension)    Hyperlipidemia    IBS (irritable bowel syndrome)    Major depression    OA (osteoarthritis)    Ovarian cancer (HCC)    Stage IIIB papillary ovarian carcinoma  s/p omentectomy and BSO & chemotherapy (completed 12-07-2014)   Paroxysmal atrial fibrillation (HCC)    Prolapse of vaginal vault after hysterectomy 07/20/2015   Rectovaginal fistula 08/05/2015   Seasonal allergies    Sigmoid diverticulosis    Smokers' cough [facility])     Past Surgical History:  Procedure Laterality Date   BIOPSY  10/12/2020   Procedure: BIOPSY;  Surgeon: Urban Garden, MD;  [facility];  Service: Gastroenterology;;   BREAST ENHANCEMENT SURGERY  1986   BREAST SURGERY  1986   CESAREAN SECTION  1978   COLONOSCOPY N/A 10/07/2015   Procedure: COLONOSCOPY;  Surgeon: Ruby Corporal, MD;  [facility];  Service: Endoscopy;  Laterality: N/A;  7:30   COLONOSCOPY WITH PROPOFOL  N/A 10/12/2020   Procedure: COLONOSCOPY WITH PROPOFOL ;  Surgeon: Urban Garden, MD;  [facility];  Service: Gastroenterology;  Laterality: N/A;  9:00   COSMETIC SURGERY  1986   CYSTOSCOPY N/A 02/27/2022   Procedure: CYSTOSCOPY;  Surgeon: Marco Severs, MD;  Location: [facility];  Service: Urology;  Laterality: N/A;   CYSTOSCOPY WITH FULGERATION N/A 11/03/2021   Procedure: CYSTOSCOPY WITH FULGERATION;  Surgeon: Marco Severs, MD;  Location: [facility];  Service: Urology;  Laterality: N/A;   ESOPHAGOGASTRODUODENOSCOPY (EGD) WITH PROPOFOL  N/A 10/12/2020   Procedure: ESOPHAGOGASTRODUODENOSCOPY (EGD) WITH PROPOFOL ;  Surgeon: Urban Garden, MD;  [facility];  Service: Gastroenterology;  Laterality: N/A;   EXPLORATORY LAPAROTOMY/ OMENTECTOMY/  BILATERAL SALPINGOOPHORECTOMY/  PORT-A-CATH  PLACEMENT  07-03-2014   [place]   KNEE ARTHROSCOPY Left 09/22/2004   LAPAROSCOPY N/A 06/02/2014   Procedure: DIAGNOSTIC LAPAROSCOPY, OMENTAL BIOPSY, RIGHT OVARY BIOPSY, LYSIS OF ADHESIONS;  Surgeon: Boyce Byes, MD;  Location: [facility];  Service: General;  Laterality: N/A;   MUCOSAL ADVANCEMENT FLAP N/A 06/15/2016   Procedure: EXCISION RECTOVAGINAOL FISTULA WITH MUCOSAL ADVANCEMENT FLAP;  Surgeon: Joyce Nixon, MD;  Location: [facility];  Service: General;  Laterality: N/A;   PLACEMENT OF SETON N/A 09/09/2015   Procedure: PLACEMENT OF SETON;  Surgeon: Joyce Nixon, MD;  Location: [facility];  Service: General;  Laterality: N/A;   PORT-A-CATH REMOVAL Right 08/17/2014   Procedure: REMOVAL INTRAPERITONEAL CHEMO PORT;  Surgeon: Boyce Byes, MD;  Location: [facility];  Service: General;  Laterality: Right;   PORTACATH PLACEMENT Right 08/17/2014   Procedure:  PLACE NEW PORT A CATH;  Surgeon: Marit Sides  Lauralee Poll, MD;  Location: [facility];  Service: General;  Laterality: Right;   RECTAL BIOPSY N/A 09/09/2015   Procedure: BIOPSY OF RECTOVAGINAL MASS;  Surgeon: Joyce Nixon, MD;  Location: [facility];  Service: General;  Laterality: N/A;   TRANSURETHRAL RESECTION OF BLADDER TUMOR N/A 02/27/2022   Procedure: TRANSURETHRAL RESECTION OF BLADDER TUMOR (TURBT);  Surgeon: Marco Severs, MD;  Location: [facility];  Service: Urology;  Laterality: N/A;   TUBAL LIGATION  YRS AGO   VAGINAL HYSTERECTOMY  1981   fibroids   Current Outpatient Medications on File Prior to Visit  Medication Sig Dispense Refill   amiodarone  (PACERONE ) 200 MG tablet TAKE 1 TABLET BY MOUTH EVERY DAY 90 tablet 3   Aspirin-Salicylamide-Caffeine (BC HEADACHE POWDER PO) Take 1-2 Packages by mouth daily at 12 noon.     Cholecalciferol (VITAMIN D -3) 125 MCG (5000 UT) TABS Take 5,000 Units by mouth daily.     clotrimazole (LOTRIMIN) 1 % cream Apply 1  application topically 2 (two) times daily as needed (itching around ankles).     Coenzyme Q10 (CO Q 10) 100 MG CAPS Take 100 mg by mouth daily.     colestipol  (COLESTID ) 1 g tablet Take 2 tablets (2 g total) by mouth daily. 180 tablet 1   divalproex  (DEPAKOTE  ER) 500 MG 24 hr tablet Take 1 tablet (500 mg total) by mouth daily. 90 tablet 3   ELIQUIS  5 MG TABS tablet TAKE 1 TABLET BY MOUTH TWICE A DAY 180 tablet 1   escitalopram  (LEXAPRO ) 20 MG tablet Take 1 tablet (20 mg total) by mouth daily. 90 tablet 2   estradiol  (ESTRACE ) 0.1 MG/GM vaginal cream Apply a pea size amount of cream to urethral area of vagina twice weekly 42.5 g 12   Ferrous Gluconate (IRON  27 PO) Take 65 mg by mouth daily. Pt is taking 65mg /325 daily     lipase/protease/amylase (CREON ) 36000 UNITS CPEP capsule Take 2 capsules (72,000 Units total) by mouth 3 (three) times daily before meals. Two capsules with meals and one with snacks. 200 capsule 1   LORazepam  (ATIVAN ) 0.5 MG tablet Take 1 tablet (0.5 mg total) by mouth 3 (three) times daily as needed for anxiety. 90 tablet 3   Multiple Vitamin (MULTIVITAMIN) tablet Take 1 tablet by mouth daily.     Multiple Vitamins-Minerals (HAIR SKIN & NAILS ADVANCED PO) Take 3 tablets by mouth daily.     omeprazole  (PRILOSEC) 40 MG capsule TAKE 1 CAPSULE (40 MG TOTAL) BY MOUTH DAILY. 90 capsule 0   OVER THE COUNTER MEDICATION LYSENE 1 daily per husband     oxyCODONE -acetaminophen  (PERCOCET) 7.5-325 MG tablet Take 1 tablet by mouth every 6 (six) hours as needed for severe pain (pain score 7-10). This script should last 30 days.  60 per month 60 tablet 0   potassium chloride  SA (KLOR-CON  M) 20 MEQ tablet Take 1 tablet (20 mEq total) by mouth daily for 5 days. (Patient not taking: Reported on 01/02/2024) 5 tablet 0   rosuvastatin  (CRESTOR ) 20 MG tablet TAKE 1 TABLET BY MOUTH EVERY DAY 90 tablet 3   No current facility-administered medications on file prior to visit.   Allergies  Allergen  Reactions   Morphine  And Codeine     Delirium with sbo   Social History   Socioeconomic History   Marital status: Married    Spouse name: Blaise Bumps   Number of children: 4   Years of education: 12   Highest education level:  12th grade  Occupational History   Occupation: retire    Comment: [employer]  Tobacco Use   Smoking status: Former    Current packs/day: 0.00    Average packs/day: 0.5 packs/day for 15.0 years (7.5 ttl pk-yrs)    Types: Cigarettes    Start date: 02/17/2003    Quit date: 02/16/2018    Years since quitting: 5.9   Smokeless tobacco: Never   Tobacco comments:    half pack a day for 15 yrs  Vaping Use   Vaping status: Never Used  Substance and Sexual Activity   Alcohol use: No   Drug use: No   Sexual activity: Not Currently    Birth control/protection: Surgical    Comment: hyst  Other Topics Concern   Not on file  Social History Narrative   Lives at home with Blaise Bumps   Retired [employer]   Married 02/16/1990   Several grandchildren    Social Drivers of Health   Financial Resource Strain: Low Risk  (09/07/2023)   Overall Financial Resource Strain (CARDIA)    Difficulty of Paying Living Expenses: Not hard at all  Food Insecurity: No Food Insecurity (09/07/2023)   Hunger Vital Sign    Worried About Running Out of Food in the Last Year: Never true    Ran Out of Food in the Last Year: Never true  Transportation Needs: No Transportation Needs (09/07/2023)   PRAPARE - Administrator, Civil Service (Medical): No    Lack of Transportation (Non-Medical): No  Physical Activity: Inactive (09/07/2023)   Exercise Vital Sign    Days of Exercise per Week: 0 days    Minutes of Exercise per Session: 20 min  Stress: Stress Concern Present (09/07/2023)   [employer] of Occupational Health - Occupational Stress Questionnaire    Feeling of Stress : To some extent  Social Connections: Moderately Integrated (09/07/2023)   Social Connection and Isolation  Panel [NHANES]    Frequency of Communication with Friends and Family: More than three times a week    Frequency of Social Gatherings with Friends and Family: Once a week    Attends Religious Services: 1 to 4 times per year    Active Member of [employer] or Organizations: No    Attends Banker Meetings: Never    Marital Status: Married  Catering manager Violence: Not At Risk (02/08/2023)   Humiliation, Afraid, Rape, and Kick questionnaire    Fear of Current or Ex-Partner: No    Emotionally Abused: No    Physically Abused: No    Sexually Abused: No   Family History  Problem Relation Age of Onset   Bipolar disorder Mother    Anxiety disorder Mother    Dementia Mother    Depression Mother    Mental illness Mother    Vision loss Mother    Alzheimer's disease Mother    Alcohol abuse Paternal Uncle    Bipolar disorder Maternal Grandmother    Dementia Maternal Grandmother    Alzheimer's disease Maternal Grandmother    Alcohol abuse Paternal Uncle    Stroke Brother    Deep vein thrombosis Son    Heart disease Father    Hyperlipidemia Father    Hypertension Father    Stroke Father    Vision loss Father    Atrial fibrillation Sister    Heart disease Brother    Heart disease Brother    ADD / ADHD Neg Hx    Drug abuse Neg Hx  OCD Neg Hx    Paranoid behavior Neg Hx    Schizophrenia Neg Hx    Seizures Neg Hx    Sexual abuse Neg Hx    Physical abuse Neg Hx       Review of Systems     Objective:   Physical Exam Vitals reviewed.  Constitutional:      General: She is not in acute distress.    Appearance: She is not ill-appearing or diaphoretic.  HENT:     Head:   Cardiovascular:     Rate and Rhythm: Normal rate and regular rhythm.     Heart sounds: Normal heart sounds. No murmur heard.    No friction rub. No gallop.  Pulmonary:     Effort: Pulmonary effort is normal. No respiratory distress.     Breath sounds: No stridor. Rhonchi and rales present. No  wheezing.    Abdominal:     General: Abdomen is flat. Bowel sounds are normal. There is no distension.     Palpations: Abdomen is soft. There is no mass.     Tenderness: There is no abdominal tenderness.  Musculoskeletal:     Right upper leg: No deformity.     Left upper leg: No deformity.     Right lower leg: No edema.     Left lower leg: No edema.  Neurological:     Mental Status: She is alert.         Assessment & Plan:  Hematoma of scalp, subsequent encounter - Plan: CBC with Differential/Platelet, Basic Metabolic Panel Without GFR Repeat lab work today to monitor her anemia and hypokalemia.  Patient has what appears to be a clotted firm superficial hematoma over the right parietal scalp.  There is no active bleeding.  I explained to the patient this may take several months to gradually reabsorb.  The only other option would be to numb the area with lidocaine , perform an incision directly in the center, extract the clotted blood, try to locate the source of bleeding and cauterize or suture the bleeding vein.  I believe this is a very bad idea and would likely lead to subsequent complications.  Therefore I recommended no action today other than to gradually let the hematoma reabsorb.

## 2024-01-19 LAB — CBC WITH DIFFERENTIAL/PLATELET
Absolute Lymphocytes: 1080 {cells}/uL (ref 850–3900)
Absolute Monocytes: 450 {cells}/uL (ref 200–950)
Basophils Absolute: 29 {cells}/uL (ref 0–200)
Basophils Relative: 0.8 %
Eosinophils Absolute: 68 {cells}/uL (ref 15–500)
Eosinophils Relative: 1.9 %
HCT: 23.9 % — ABNORMAL LOW (ref 35.0–45.0)
Hemoglobin: 7.8 g/dL — ABNORMAL LOW (ref 11.7–15.5)
MCH: 31.8 pg (ref 27.0–33.0)
MCHC: 32.6 g/dL (ref 32.0–36.0)
MCV: 97.6 fL (ref 80.0–100.0)
MPV: 10.2 fL (ref 7.5–12.5)
Monocytes Relative: 12.5 %
Neutro Abs: 1973 {cells}/uL (ref 1500–7800)
Neutrophils Relative %: 54.8 %
Platelets: 235 10*3/uL (ref 140–400)
RBC: 2.45 10*6/uL — ABNORMAL LOW (ref 3.80–5.10)
RDW: 15.4 % — ABNORMAL HIGH (ref 11.0–15.0)
Total Lymphocyte: 30 %
WBC: 3.6 10*3/uL — ABNORMAL LOW (ref 3.8–10.8)

## 2024-01-19 LAB — BASIC METABOLIC PANEL WITHOUT GFR
BUN/Creatinine Ratio: 14 (calc) (ref 6–22)
BUN: 15 mg/dL (ref 7–25)
CO2: 29 mmol/L (ref 20–32)
Calcium: 8.2 mg/dL — ABNORMAL LOW (ref 8.6–10.4)
Chloride: 105 mmol/L (ref 98–110)
Creat: 1.04 mg/dL — ABNORMAL HIGH (ref 0.60–1.00)
Glucose, Bld: 83 mg/dL (ref 65–99)
Potassium: 3.5 mmol/L (ref 3.5–5.3)
Sodium: 141 mmol/L (ref 135–146)

## 2024-01-21 ENCOUNTER — Telehealth: Payer: Self-pay

## 2024-01-21 NOTE — Telephone Encounter (Signed)
 Copied from CRM [id number]. Topic: Clinical - Lab/Test Results >> Jan 21, 2024 10:34 AM Adonis Hoot wrote: Reason for CRM: Patient returned call regarding lab results.I relayed message from provider.Husband stated that she is taking the ferrous sulfate medications. They are making an appointment with oncologist today.

## 2024-01-25 ENCOUNTER — Telehealth: Payer: Self-pay | Admitting: *Deleted

## 2024-01-25 NOTE — Telephone Encounter (Signed)
 Called Fannie Cream into [place]

## 2024-01-28 ENCOUNTER — Ambulatory Visit: Admitting: Adult Health

## 2024-01-28 ENCOUNTER — Other Ambulatory Visit: Payer: Self-pay

## 2024-01-28 DIAGNOSIS — C569 Malignant neoplasm of unspecified ovary: Secondary | ICD-10-CM

## 2024-01-28 DIAGNOSIS — C210 Malignant neoplasm of anus, unspecified: Secondary | ICD-10-CM

## 2024-01-29 ENCOUNTER — Inpatient Hospital Stay: Admitting: Hematology

## 2024-01-29 ENCOUNTER — Inpatient Hospital Stay: Attending: Hematology

## 2024-01-29 ENCOUNTER — Other Ambulatory Visit: Payer: Self-pay | Admitting: Hematology

## 2024-01-29 VITALS — BP 125/80 | HR 82 | Temp 97.3°F | Resp 18 | Ht 65.0 in | Wt 121.0 lb

## 2024-01-29 DIAGNOSIS — C569 Malignant neoplasm of unspecified ovary: Secondary | ICD-10-CM | POA: Insufficient documentation

## 2024-01-29 DIAGNOSIS — C210 Malignant neoplasm of anus, unspecified: Secondary | ICD-10-CM

## 2024-01-29 DIAGNOSIS — Z85048 Personal history of other malignant neoplasm of rectum, rectosigmoid junction, and anus: Secondary | ICD-10-CM | POA: Insufficient documentation

## 2024-01-29 DIAGNOSIS — D508 Other iron deficiency anemias: Secondary | ICD-10-CM

## 2024-01-29 DIAGNOSIS — D649 Anemia, unspecified: Secondary | ICD-10-CM | POA: Insufficient documentation

## 2024-01-29 DIAGNOSIS — Z720 Tobacco use: Secondary | ICD-10-CM | POA: Insufficient documentation

## 2024-01-29 LAB — CBC WITH DIFFERENTIAL/PLATELET
Abs Immature Granulocytes: 0.08 10*3/uL — ABNORMAL HIGH (ref 0.00–0.07)
Basophils Absolute: 0 10*3/uL (ref 0.0–0.1)
Basophils Relative: 0 %
Eosinophils Absolute: 0.1 10*3/uL (ref 0.0–0.5)
Eosinophils Relative: 2 %
HCT: 30 % — ABNORMAL LOW (ref 36.0–46.0)
Hemoglobin: 9 g/dL — ABNORMAL LOW (ref 12.0–15.0)
Immature Granulocytes: 2 %
Lymphocytes Relative: 17 %
Lymphs Abs: 0.8 10*3/uL (ref 0.7–4.0)
MCH: 30.8 pg (ref 26.0–34.0)
MCHC: 30 g/dL (ref 30.0–36.0)
MCV: 102.7 fL — ABNORMAL HIGH (ref 80.0–100.0)
Monocytes Absolute: 0.4 10*3/uL (ref 0.1–1.0)
Monocytes Relative: 8 %
Neutro Abs: 3.4 10*3/uL (ref 1.7–7.7)
Neutrophils Relative %: 71 %
Platelets: 323 10*3/uL (ref 150–400)
RBC: 2.92 MIL/uL — ABNORMAL LOW (ref 3.87–5.11)
RDW: 18.1 % — ABNORMAL HIGH (ref 11.5–15.5)
WBC: 4.8 10*3/uL (ref 4.0–10.5)
nRBC: 0 % (ref 0.0–0.2)

## 2024-01-29 LAB — COMPREHENSIVE METABOLIC PANEL WITH GFR
ALT: 17 U/L (ref 0–44)
AST: 19 U/L (ref 15–41)
Albumin: 2.9 g/dL — ABNORMAL LOW (ref 3.5–5.0)
Alkaline Phosphatase: 56 U/L (ref 38–126)
Anion gap: 11 (ref 5–15)
BUN: 15 mg/dL (ref 8–23)
CO2: 25 mmol/L (ref 22–32)
Calcium: 8.6 mg/dL — ABNORMAL LOW (ref 8.9–10.3)
Chloride: 104 mmol/L (ref 98–111)
Creatinine, Ser: 0.88 mg/dL (ref 0.44–1.00)
GFR, Estimated: >60 mL/min (ref >60)
Glucose, Bld: 84 mg/dL (ref 70–99)
Potassium: 2.9 mmol/L — ABNORMAL LOW (ref 3.5–5.1)
Sodium: 140 mmol/L (ref 135–145)
Total Bilirubin: 0.2 mg/dL (ref 0.0–1.2)
Total Protein: 6.1 g/dL — ABNORMAL LOW (ref 6.5–8.1)

## 2024-01-29 LAB — IRON AND TIBC
Iron: 74 ug/dL (ref 28–170)
Saturation Ratios: 22 % (ref 10.4–31.8)
TIBC: 332 ug/dL (ref 250–450)
UIBC: 258 ug/dL

## 2024-01-29 LAB — FERRITIN: Ferritin: 27 ng/mL (ref 11–307)

## 2024-01-29 MED ORDER — POTASSIUM CHLORIDE CRYS ER 20 MEQ PO TBCR
20.0000 meq | EXTENDED_RELEASE_TABLET | Freq: Every day | ORAL | 3 refills | Status: DC
Start: 2024-01-29 — End: 2024-02-11

## 2024-01-29 NOTE — Progress Notes (Signed)
 [facility] [address], [place] [address]    Clinic Day:  01/29/2024  Referring physician: Austine Lefort, MD  Patient Care Team: Austine Lefort, MD as PCP - General (Family Medicine) Gerard Knight, MD as PCP - Cardiology (Cardiology) Ruby Corporal, MD (Inactive) as Consulting Physician (Gastroenterology) Alysia Bachelor, MD as Consulting Physician (Behavioral Health) Joyce Nixon, MD as Consulting Physician (General Surgery) Eunice Hides, Alphonsa Asai (Inactive) as Physician Assistant (Oncology) Adah Acron, MD (Inactive) as Consulting Physician (Orthopedic Surgery) Myrle Aspen, [facility] (Inactive) as Pharmacist (Pharmacist) Diamond Formica, MD as Consulting Physician (Pulmonary Disease) Diamond Formica, MD as Consulting Physician (Pulmonary Disease)   ASSESSMENT & PLAN:   Assessment: 1.  Stage II (T2 N0 M0) anal cancer: -Diagnosed in December 2016. -Chemoradiation therapy with 5-FU and mitomycin  completed on 12/06/2015, treated at [facility]. - Last colonoscopy (10/12/2020): Diverticulosis in the sigmoid colon with no other findings on perianal exam.   2.  Stage III ovarian cancer: -Diagnosed in 2015, status post debulking surgery followed by 6 cycles of carboplatin and Taxol completed in March 2016.  3. Social/Family History: -Lives at home with her husband. Independent of ADL's and some IADL's. She is not able to drive and does minimal cooking and cleaning. Tobacco use of 0.5 ppd.  -No known family history of cancer.   Plan: 1.  Recurrent ovarian cancer: - PET scan (11/15/2023): Hypermetabolic peritoneal soft tissue thickening in the right anatomic pelvis, worrisome for peritoneal carcinomatosis. - CA125 (10/31/2023): Elevated at 48.5. - She does not have any abdominal pain. - Recommend repeating CTAP with contrast and CA125 level. - Recommend evaluation by Dr. Orvil Bland for biopsy/surgery/neoadjuvant chemotherapy. - RTC 2 to 3  weeks for follow-up.   2.  Normocytic/macrocytic anemia: - Denies any bleeding per rectum or melena.  She is taking iron  tablet daily. - Latest hemoglobin today is 9.0 with MCV 102. - Recent B12 level was 556 on 01/01/2024.  I will check folic acid , MMA, copper, ferritin and iron  panel today.  If iron  is low, will consider parenteral iron  therapy.     Orders Placed This Encounter  Procedures   CT ABDOMEN PELVIS W CONTRAST    Standing Status:   Future    Expected Date:   02/12/2024    Expiration Date:   01/28/2025    If indicated for the ordered procedure, I authorize the administration of contrast media per Radiology protocol:   Yes    Does the patient have a contrast media/X-ray dye allergy?:   No    Preferred imaging location?:   [facility]    If indicated for the ordered procedure, I authorize the administration of oral contrast media per Radiology protocol:   Yes   Iron  and TIBC ([facility] ONLY)   Ferritin   Vitamin B12    Standing Status:   Future    Expected Date:   02/12/2024    Expiration Date:   01/28/2025   Folate    Standing Status:   Future    Expected Date:   02/12/2024    Expiration Date:   01/28/2025   Methylmalonic acid, serum    Standing Status:   Future    Expected Date:   02/12/2024    Expiration Date:   01/28/2025   Copper, serum    Standing Status:   Future    Expected Date:   02/12/2024    Expiration Date:   01/28/2025  Ambulatory Referral to [facility]    Referral Priority:   Routine    Referral Type:   Consultation    Referral Reason:   Specialty Services Required    Requested Specialty:   Oncology    Number of Visits Requested:   1   Ambulatory referral to Social Work    Referral Priority:   Routine    Referral Type:   Consultation    Referral Reason:   Specialty Services Required    Number of Visits Requested:   1      I,Helena R Teague,acting as a scribe for Paulett Boros, MD.,have documented all relevant  documentation on the behalf of Paulett Boros, MD,as directed by  Paulett Boros, MD while in the presence of Paulett Boros, MD.   I, Paulett Boros MD, have reviewed the above documentation for accuracy and completeness, and I agree with the above.   Paulett Boros, MD   5/6/20251:07 PM  CHIEF COMPLAINT:   Diagnosis: Anal Cancer and Epithelial ovarian cancer   Cancer Staging  Anal cancer (HCC) Staging form: Anus, AJCC 8th Edition - Clinical: Stage IIA (cT2, cN0, cM0) - Signed by Doretta Gant, PA-C on 09/27/2016    Prior Therapy:  1. Debulking surgery with BSO on 07/04/2014. 2. Chemoradiation therapy with 5-FU and mitomycin  completed on 12/06/2015.   NGS Results: not done  Current Therapy:  surveillance    HISTORY OF PRESENT ILLNESS:   Oncology History  History of ovarian cancer  06/02/2014 Procedure   Diagnostic laparoscopy, lysis of adhesions, biopsy of omental nodules, biopsy right ovary by Dr Lauralee Poll   06/04/2014 Pathology Results   Diagnosis 1. Omentum, biopsy, Anterior peritoneal & omental nodule - SEROUS CARCINOMA WITH ASSOCIATED ABUNDANT PSAMMOMA BODIES AND DESMOPLASTIC STROMAL REACTION CONSISTENT WITH INVASIVE IMPLANTS. - SEE COMMENT. 2. Omentum, biopsy, Omental nodule - SEROUS CARCINOMA WITH ASSOCIATED ABUNDANT PSAMMOMA BODIES AND DESMOPLASTIC STROMAL REACTION CONSISTENT WITH INVASIVE IMPLANTS. - SEE COMMENT. 3. Ovary, biopsy/wedge resection, Right ovary - SMALL FRAGMENTS OF ATYPICAL PAPILLARY PROLIFERATION WITH PSAMMOMA BODIES CONSISTENT WITH AT LEAST SEROUS BORDERLINE TUMOR.   07/03/2014 Procedure   Exploratory laparotomy, omentectomy, Bilateral salpingo-oophorectomy, inptraperitoneal PORT placement by Dr. Clerance Dais       07/24/2014 - 12/07/2014 Chemotherapy   Carboplatin/Paclitaxel x 6 cycles    08/17/2014 Procedure   Insertion of 8 French power port clear view tunneled venous vascular access device next line use of  fluoroscopy for guidance and positioning Use of ultrasound for venipuncture Removal of intraperitoneal chemotherapy port By Dr. Lauralee Poll   05/14/2017 Genetic Testing   Negative genetic testing on the [facility] panel.  The [facility] gene panel offered by [employer] includes sequencing and deletion/duplication testing of the following 28 genes: APC, ATM, BARD1, BMPR1A, BRCA1, BRCA2, BRIP1, CHD1, CDK4, CDKN2A, CHEK2, EPCAM (large rearrangement only), MLH1, MSH2, MSH6, MUTYH, NBN, PALB2, PMS2, PTEN, RAD51C, RAD51D, SMAD4, STK11, and TP53. Sequencing was performed for select regions of POLE and POLD1, and large rearrangement analysis was performed for select regions of GREM1. The report date is April 27, 2017.  HRD testing was performed on the ovarian tumor.  The tumor was negative for any deleterious BRCA1 or BRCA2 mutations.    Genomic instability status was not interpretable.  Therefore, [facility] was unable to analyze the ovarian tumor for genomic instability.  The report date is May 14, 2017.    Anal cancer (HCC)  09/09/2015 Procedure   Rectovaginal septal mass biopsy by Dr. Andy Bannister   09/10/2015 Pathology Results  Soft tissue mass, biopsy, rectovaginal septal mass - SQUAMOUS CELL CARCINOMA.   10/15/2015 PET scan   1. Focal hypermetabolism with associated ill-defined soft tissue fullness at the junction of the anterior anal wall and posterior inferior vaginal wall, in keeping with the provided history of a primary anal carcinoma. 2. No hypermetabolic locoregional or distant metastatic disease. 3. Status post hysterectomy, with no abnormal findings at the vaginal cuff. No evidence of hypermetabolic peritoneal tumor. 4. Tiny nonobstructing bilateral renal stones.   10/25/2015 - 12/06/2015 Radiation Therapy   [place]   10/25/2015 - 12/06/2015 Chemotherapy   Mitomycin  C and 5FU, by Dr. Otelia Blew    06/15/2016 Procedure   EXCISION RECTOVAGINAOL FISTULA WITH MUCOSAL  ADVANCEMENT FLAP by Dr. Andy Bannister   06/18/2016 Pathology Results   Fistula, rectovaginal SQUAMOUS, COLUMNAR JUNCTION MUCOSA AND SUBCUTANOUS SOFT TISSUE WITH FISTULA NO EVIDENCE OF MALIGNANCY   09/20/2016 Imaging   CT CAP- 1. No acute findings and no evidence for recurrent tumor or metastatic disease. 2. Aortic atherosclerosis and coronary artery calcification      INTERVAL HISTORY:   Abigail Wagner is a 73 y.o. female presenting to clinic today for follow up of anal and epithelial ovarian cancer. She was last seen by me on 02/06/22.  Since her last visit, she underwent PET on 11/15/23 that found: Waxing and waning areas of consolidation in the lungs bilaterally with improvement in the right middle and right lower lobes, worsening in the lingula and stability in the left lower lobe. Associated hypermetabolism in the lingula. Hypermetabolic peritoneal soft tissue thickening in the right anatomic pelvis, worrisome for peritoneal carcinomatosis related to ovarian cancer. Tiny left renal stones. Aortic atherosclerosis.   Asmara presented to the ED on 01/13/24 for a bleed on the head after a fall 2 weeks prior, requiring dressing and sutures in the ED at that time. Dressing was redone and Coban was given. Her husband states she has decreased her activity level since she developed the hematoma on her head and no longer works outside in her garden. Of note, she was   Today, she states that she is doing well overall. Her appetite level is at 75%. Her energy level is at 50%. Aleasha is accompanied by her husband. She is independent of ADL's and some IADL's. She currently smokes 0.5 ppd since 2019, and previously smoked in the past. Her husband notes she has BM's quickly after eating and does not believe she is absorbing her food. She does note a normal appetite. She has gained 10 pounds since 01/18/24. Jeanie is taking iron  pills daily.   PAST MEDICAL HISTORY:   Past Medical History: Past Medical History:  Diagnosis  Date   Allergy    Anal carcinoma (HCC)    Chemo and radiation   Anemia    Anxiety 1974   Dr. [name] at [place] (psychiatrist).     Blood transfusion without reported diagnosis 2017   Carotid artery disease (HCC)    Chronic kidney disease    DDD (degenerative disc disease), lumbosacral    GERD (gastroesophageal reflux disease) 2010   Headache(784.0)    History of adenomatous polyp of colon 10/07/2015   Tubular adenoma high grade dysplasia   History of herpes genitalis    History of kidney stones    History of suicide attempt 2010   HTN (hypertension)    Hyperlipidemia    IBS (irritable bowel syndrome)    Major depression    OA (osteoarthritis)    Ovarian cancer (HCC)  Stage IIIB papillary ovarian carcinoma  s/p omentectomy and BSO & chemotherapy (completed 12-07-2014)   Paroxysmal atrial fibrillation (HCC)    Prolapse of vaginal vault after hysterectomy 07/20/2015   Rectovaginal fistula 08/05/2015   Seasonal allergies    Sigmoid diverticulosis    Smokers' cough [facility])     Surgical History: Past Surgical History:  Procedure Laterality Date   BIOPSY  10/12/2020   Procedure: BIOPSY;  Surgeon: Urban Garden, MD;  [facility];  Service: Gastroenterology;;   BREAST ENHANCEMENT SURGERY  1986   BREAST SURGERY  1986   CESAREAN SECTION  1978   COLONOSCOPY N/A 10/07/2015   Procedure: COLONOSCOPY;  Surgeon: Ruby Corporal, MD;  [facility]: [facility];  Service: Endoscopy;  Laterality: N/A;  7:30   COLONOSCOPY WITH PROPOFOL  N/A 10/12/2020   Procedure: COLONOSCOPY WITH PROPOFOL ;  Surgeon: Urban Garden, MD;  [facility];  Service: Gastroenterology;  Laterality: N/A;  9:00   COSMETIC SURGERY  1986   CYSTOSCOPY N/A 02/27/2022   Procedure: CYSTOSCOPY;  Surgeon: Marco Severs, MD;  Location: [facility];  Service: Urology;  Laterality: N/A;   CYSTOSCOPY WITH FULGERATION N/A 11/03/2021   Procedure: CYSTOSCOPY WITH FULGERATION;  Surgeon:  Marco Severs, MD;  Location: [facility];  Service: Urology;  Laterality: N/A;   ESOPHAGOGASTRODUODENOSCOPY (EGD) WITH PROPOFOL  N/A 10/12/2020   Procedure: ESOPHAGOGASTRODUODENOSCOPY (EGD) WITH PROPOFOL ;  Surgeon: Urban Garden, MD;  [facility];  Service: Gastroenterology;  Laterality: N/A;   EXPLORATORY LAPAROTOMY/ OMENTECTOMY/  BILATERAL SALPINGOOPHORECTOMY/  PORT-A-CATH PLACEMENT  07-03-2014   [place]   KNEE ARTHROSCOPY Left 09/22/2004   LAPAROSCOPY N/A 06/02/2014   Procedure: DIAGNOSTIC LAPAROSCOPY, OMENTAL BIOPSY, RIGHT OVARY BIOPSY, LYSIS OF ADHESIONS;  Surgeon: Boyce Byes, MD;  Location: [facility];  Service: General;  Laterality: N/A;   MUCOSAL ADVANCEMENT FLAP N/A 06/15/2016   Procedure: EXCISION RECTOVAGINAOL FISTULA WITH MUCOSAL ADVANCEMENT FLAP;  Surgeon: Joyce Nixon, MD;  Location: [facility];  Service: General;  Laterality: N/A;   PLACEMENT OF SETON N/A 09/09/2015   Procedure: PLACEMENT OF SETON;  Surgeon: Joyce Nixon, MD;  Location[facility];  Service: General;  Laterality: N/A;   PORT-A-CATH REMOVAL Right 08/17/2014   Procedure: REMOVAL INTRAPERITONEAL CHEMO PORT;  Surgeon: Boyce Byes, MD;  Location: [facility];  Service: General;  Laterality: Right;   PORTACATH PLACEMENT Right 08/17/2014   Procedure:  PLACE NEW PORT A CATH;  Surgeon: Boyce Byes, MD;  Location: [facility];  Service: General;  Laterality: Right;   RECTAL BIOPSY N/A 09/09/2015   Procedure: BIOPSY OF RECTOVAGINAL MASS;  Surgeon: Joyce Nixon, MD;  Location[facility];  Service: General;  Laterality: N/A;   TRANSURETHRAL RESECTION OF BLADDER TUMOR N/A 02/27/2022   Procedure: TRANSURETHRAL RESECTION OF BLADDER TUMOR (TURBT);  Surgeon: Marco Severs, MD;  Location: [facility];  Service: Urology;  Laterality: N/A;   TUBAL LIGATION  YRS AGO   VAGINAL HYSTERECTOMY  1981   fibroids    Social  History: Social History   Socioeconomic History   Marital status: Married    Spouse name: Blaise Bumps   Number of children: 4   Years of education: 12   Highest education level: 12th grade  Occupational History   Occupation: retire    Comment: [employer]  Tobacco Use   Smoking status: Former    Current packs/day: 0.00    Average packs/day: 0.5 packs/day for 15.0 years (7.5  ttl pk-yrs)    Types: Cigarettes    Start date: 02/17/2003    Quit date: 02/16/2018    Years since quitting: 5.9   Smokeless tobacco: Never   Tobacco comments:    half pack a day for 15 yrs  Vaping Use   Vaping status: Never Used  Substance and Sexual Activity   Alcohol use: No   Drug use: No   Sexual activity: Not Currently    Birth control/protection: Surgical    Comment: hyst  Other Topics Concern   Not on file  Social History Narrative   Lives at home with Blaise Bumps   Retired [employer]   Married 02/16/1990   Several grandchildren    Social Drivers of Health   Financial Resource Strain: Low Risk  (09/07/2023)   Overall Financial Resource Strain (CARDIA)    Difficulty of Paying Living Expenses: Not hard at all  Food Insecurity: No Food Insecurity (09/07/2023)   Hunger Vital Sign    Worried About Running Out of Food in the Last Year: Never true    Ran Out of Food in the Last Year: Never true  Transportation Needs: No Transportation Needs (09/07/2023)   PRAPARE - Administrator, Civil Service (Medical): No    Lack of Transportation (Non-Medical): No  Physical Activity: Inactive (09/07/2023)   Exercise Vital Sign    Days of Exercise per Week: 0 days    Minutes of Exercise per Session: 20 min  Stress: Stress Concern Present (09/07/2023)   [employer] of Occupational Health - Occupational Stress Questionnaire    Feeling of Stress : To some extent  Social Connections: Moderately Integrated (09/07/2023)   Social Connection and Isolation Panel [NHANES]    Frequency of Communication with  Friends and Family: More than three times a week    Frequency of Social Gatherings with Friends and Family: Once a week    Attends Religious Services: 1 to 4 times per year    Active Member of [employer] or Organizations: No    Attends Banker Meetings: Never    Marital Status: Married  Catering manager Violence: Not At Risk (02/08/2023)   Humiliation, Afraid, Rape, and Kick questionnaire    Fear of Current or Ex-Partner: No    Emotionally Abused: No    Physically Abused: No    Sexually Abused: No    Family History: Family History  Problem Relation Age of Onset   Bipolar disorder Mother    Anxiety disorder Mother    Dementia Mother    Depression Mother    Mental illness Mother    Vision loss Mother    Alzheimer's disease Mother    Alcohol abuse Paternal Uncle    Bipolar disorder Maternal Grandmother    Dementia Maternal Grandmother    Alzheimer's disease Maternal Grandmother    Alcohol abuse Paternal Uncle    Stroke Brother    Deep vein thrombosis Son    Heart disease Father    Hyperlipidemia Father    Hypertension Father    Stroke Father    Vision loss Father    Atrial fibrillation Sister    Heart disease Brother    Heart disease Brother    ADD / ADHD Neg Hx    Drug abuse Neg Hx    OCD Neg Hx    Paranoid behavior Neg Hx    Schizophrenia Neg Hx    Seizures Neg Hx    Sexual abuse Neg Hx    Physical abuse Neg  Hx     Current Medications:  Current Outpatient Medications:    amiodarone  (PACERONE ) 200 MG tablet, TAKE 1 TABLET BY MOUTH EVERY DAY, Disp: 90 tablet, Rfl: 3   Aspirin-Salicylamide-Caffeine (BC HEADACHE POWDER PO), Take 1-2 Packages by mouth daily at 12 noon., Disp: , Rfl:    Cholecalciferol (VITAMIN D -3) 125 MCG (5000 UT) TABS, Take 5,000 Units by mouth daily., Disp: , Rfl:    clotrimazole (LOTRIMIN) 1 % cream, Apply 1 application topically 2 (two) times daily as needed (itching around ankles)., Disp: , Rfl:    Coenzyme Q10 (CO Q 10) 100 MG CAPS,  Take 100 mg by mouth daily., Disp: , Rfl:    colestipol  (COLESTID ) 1 g tablet, Take 2 tablets (2 g total) by mouth daily., Disp: 180 tablet, Rfl: 1   divalproex  (DEPAKOTE  ER) 500 MG 24 hr tablet, Take 1 tablet (500 mg total) by mouth daily., Disp: 90 tablet, Rfl: 3   ELIQUIS  5 MG TABS tablet, TAKE 1 TABLET BY MOUTH TWICE A DAY, Disp: 180 tablet, Rfl: 1   escitalopram  (LEXAPRO ) 20 MG tablet, Take 1 tablet (20 mg total) by mouth daily., Disp: 90 tablet, Rfl: 2   estradiol  (ESTRACE ) 0.1 MG/GM vaginal cream, Apply a pea size amount of cream to urethral area of vagina twice weekly, Disp: 42.5 g, Rfl: 12   Ferrous Gluconate (IRON  27 PO), Take 65 mg by mouth daily. Pt is taking 65mg /325 daily, Disp: , Rfl:    lipase/protease/amylase (CREON ) 36000 UNITS CPEP capsule, Take 2 capsules (72,000 Units total) by mouth 3 (three) times daily before meals. Two capsules with meals and one with snacks., Disp: 200 capsule, Rfl: 1   LORazepam  (ATIVAN ) 0.5 MG tablet, Take 1 tablet (0.5 mg total) by mouth 3 (three) times daily as needed for anxiety., Disp: 90 tablet, Rfl: 3   Multiple Vitamin (MULTIVITAMIN) tablet, Take 1 tablet by mouth daily., Disp: , Rfl:    Multiple Vitamins-Minerals (HAIR SKIN & NAILS ADVANCED PO), Take 3 tablets by mouth daily., Disp: , Rfl:    omeprazole  (PRILOSEC) 40 MG capsule, TAKE 1 CAPSULE (40 MG TOTAL) BY MOUTH DAILY., Disp: 90 capsule, Rfl: 0   OVER THE COUNTER MEDICATION, LYSENE 1 daily per husband, Disp: , Rfl:    oxyCODONE -acetaminophen  (PERCOCET) 7.5-325 MG tablet, Take 1 tablet by mouth every 6 (six) hours as needed for severe pain (pain score 7-10). This script should last 30 days.  60 per month, Disp: 60 tablet, Rfl: 0   potassium chloride  SA (KLOR-CON  M) 20 MEQ tablet, Take 1 tablet (20 mEq total) by mouth daily., Disp: 30 tablet, Rfl: 3   rosuvastatin  (CRESTOR ) 20 MG tablet, TAKE 1 TABLET BY MOUTH EVERY DAY, Disp: 90 tablet, Rfl: 3   Allergies: Allergies  Allergen Reactions    Morphine  And Codeine     Delirium with sbo    REVIEW OF SYSTEMS:   Review of Systems  Constitutional:  Negative for chills, fatigue and fever.  HENT:   Negative for lump/mass, mouth sores, nosebleeds, sore throat and trouble swallowing.   Eyes:  Negative for eye problems.  Respiratory:  Positive for cough. Negative for shortness of breath.   Cardiovascular:  Positive for palpitations. Negative for chest pain and leg swelling.  Gastrointestinal:  Negative for abdominal pain, constipation, diarrhea, nausea and vomiting.  Genitourinary:  Negative for bladder incontinence, difficulty urinating, dysuria, frequency, hematuria and nocturia.   Musculoskeletal:  Negative for arthralgias, back pain, flank pain, myalgias and neck pain.  Skin:  Negative for itching and rash.  Neurological:  Positive for dizziness. Negative for headaches and numbness.  Hematological:  Does not bruise/bleed easily.  Psychiatric/Behavioral:  Negative for depression, sleep disturbance and suicidal ideas. The patient is not nervous/anxious.   All other systems reviewed and are negative.    VITALS:   Blood pressure 125/80, pulse 82, temperature (!) 97.3 F (36.3 C), temperature source Tympanic, resp. rate 18, height 5\' 5"  (1.651 m), weight 121 lb (54.9 kg), SpO2 97%.  Wt Readings from Last 3 Encounters:  01/29/24 121 lb (54.9 kg)  01/18/24 111 lb 8 oz (50.6 kg)  01/13/24 111 lb (50.3 kg)    Body mass index is 20.14 kg/m.  Performance status (ECOG): 1 - Symptomatic but completely ambulatory  PHYSICAL EXAM:   Physical Exam Vitals and nursing note reviewed. Exam conducted with a chaperone present.  Constitutional:      Appearance: Normal appearance.  Cardiovascular:     Rate and Rhythm: Normal rate and regular rhythm.     Pulses: Normal pulses.     Heart sounds: Normal heart sounds.     Comments: +occasional palpitations Pulmonary:     Effort: Pulmonary effort is normal.     Breath sounds: Normal breath  sounds.  Abdominal:     Palpations: Abdomen is soft. There is no hepatomegaly, splenomegaly or mass.     Tenderness: There is no abdominal tenderness.     Comments: +no masses palpable  Musculoskeletal:     Right lower leg: 1+ Edema present.     Left lower leg: 1+ Edema present.  Lymphadenopathy:     Cervical: No cervical adenopathy.     Right cervical: No superficial, deep or posterior cervical adenopathy.    Left cervical: No superficial, deep or posterior cervical adenopathy.     Upper Body:     Right upper body: No supraclavicular or axillary adenopathy.     Left upper body: No supraclavicular or axillary adenopathy.  Neurological:     General: No focal deficit present.     Mental Status: She is alert and oriented to person, place, and time.  Psychiatric:        Mood and Affect: Mood normal.        Behavior: Behavior normal.     LABS:   CBC     Component Value Date/Time   WBC 4.8 01/29/2024 0858   RBC 2.92 (L) 01/29/2024 0858   HGB 9.0 (L) 01/29/2024 0858   HGB 10.6 (L) 10/31/2023 1431   HCT 30.0 (L) 01/29/2024 0858   HCT 32.4 (L) 10/31/2023 1431   PLT 323 01/29/2024 0858   PLT 254 10/31/2023 1431   MCV 102.7 (H) 01/29/2024 0858   MCV 93 10/31/2023 1431   MCH 30.8 01/29/2024 0858   MCHC 30.0 01/29/2024 0858   RDW 18.1 (H) 01/29/2024 0858   RDW 15.3 10/31/2023 1431   LYMPHSABS 0.8 01/29/2024 0858   LYMPHSABS 1.3 10/31/2023 1431   MONOABS 0.4 01/29/2024 0858   EOSABS 0.1 01/29/2024 0858   EOSABS 0.0 10/31/2023 1431   BASOSABS 0.0 01/29/2024 0858   BASOSABS 0.0 10/31/2023 1431    CMP      Component Value Date/Time   NA 140 01/29/2024 0858   K 2.9 (L) 01/29/2024 0858   CL 104 01/29/2024 0858   CO2 25 01/29/2024 0858   GLUCOSE 84 01/29/2024 0858   BUN 15 01/29/2024 0858   CREATININE 0.88 01/29/2024 0858   CREATININE 1.04 (H) 01/18/2024 1605   CALCIUM   8.6 (L) 01/29/2024 0858   PROT 6.1 (L) 01/29/2024 0858   ALBUMIN 2.9 (L) 01/29/2024 0858   AST 19  01/29/2024 0858   ALT 17 01/29/2024 0858   ALKPHOS 56 01/29/2024 0858   BILITOT 0.2 01/29/2024 0858   GFRNONAA >60 01/29/2024 0858   GFRNONAA 78 03/07/2021 1557   GFRAA 91 03/07/2021 1557     Lab Results  Component Value Date   CEA1 13.0 (H) 10/31/2023   CEA 5.8 (H) 04/13/2016   /  CEA  Date Value Ref Range Status  10/31/2023 13.0 (H) 0.0 - 4.7 ng/mL Final    Comment:                                 Nonsmokers          <3.9                              Smokers             <5.6 [employer] Electrochemiluminescence Immunoassay (ECLIA) Values obtained with different assay methods or kits cannot be used interchangeably.  Results cannot be interpreted as absolute evidence of the presence or absence of malignant disease.   04/13/2016 5.8 (H) 0.0 - 4.7 ng/mL Final    Comment:    (NOTE)       Roche ECLIA methodology       Nonsmokers  <3.9                                     Smokers     <5.6 Performed At: [facility] [address] [place], [place] [id number] Juel Nutley MD WU:[id number]    No results found for: "PSA1" Lab Results  Component Value Date   NFA213 08 10/31/2023   Lab Results  Component Value Date   CAN125 48.5 (H) 10/31/2023    No results found for: "TOTALPROTELP", "ALBUMINELP", "A1GS", "A2GS", "BETS", "BETA2SER", "GAMS", "MSPIKE", "SPEI" Lab Results  Component Value Date   TIBC 332 01/29/2024   TIBC 313 09/24/2023   TIBC 330 10/30/2022   FERRITIN 27 01/29/2024   FERRITIN 49 09/24/2023   FERRITIN 55 10/30/2022   IRONPCTSAT 22 01/29/2024   IRONPCTSAT 19 09/24/2023   IRONPCTSAT 8 (L) 10/30/2022   Lab Results  Component Value Date   LDH 165 03/03/2019   LDH 171 08/30/2018   LDH 137 01/25/2017     STUDIES:   DG Chest 2 View Result Date: 01/02/2024 CLINICAL DATA:  73 year old female with lingular pneumonia EXAM: CHEST - 2 VIEW COMPARISON:  07/17/2023, chest CT 08/30/2023, PET-CT 11/15/2023 FINDINGS: Cardiomediastinal silhouette  unchanged in size and contour. Unchanged right IJ port catheter. No pneumothorax or pleural effusion. Asymmetric elevation of the left hemidiaphragm similar to the prior. Pleuroparenchymal thickening/scarring at the apices of the lungs similar to the prior. Focal opacity in the left mid lung, corresponding to the lingula on the lateral view. On the lateral view there is infrahilar opacity extending to the left hemidiaphragm. Degenerative changes of the spine. IMPRESSION: Compared to prior PET-CT and chest CT there is persistent though improved multifocal infection. Surveillance until resolution is recommended. Electronically Signed   By: Myrlene Asper D.O.   On: 01/02/2024 11:52

## 2024-01-29 NOTE — Patient Instructions (Addendum)
 [facility] at [facility] Discharge Instructions   You were seen and examined today by Dr. Cheree Cords.  He reviewed the results of your lab work which are mostly normal/stable. Your potassium is low at 2.9. We will send a prescription to your pharmacy for potassium supplements. Take as prescribed.  He reviewed the results of your PET scan from February. It is showing an area of lighting up in the pelvis, suspicious for cancer, given your history of ovarian cancer.   We will see you back in 2-3 weeks. We will arrange for you to have a CT scan and also see the GYN surgeon for evaluation.   Return as scheduled.    Thank you for choosing [facility] at [facility] to provide your oncology and hematology care.  To afford each patient quality time with our provider, please arrive at least 15 minutes before your scheduled appointment time.   If you have a lab appointment with the Cancer Center please come in thru the Main Entrance and check in at the main information desk.  You need to re-schedule your appointment should you arrive 10 or more minutes late.  We strive to give you quality time with our providers, and arriving late affects you and other patients whose appointments are after yours.  Also, if you no show three or more times for appointments you may be dismissed from the clinic at the providers discretion.     Again, thank you for choosing [facility].  Our hope is that these requests will decrease the amount of time that you wait before being seen by our physicians.       _____________________________________________________________  Should you have questions after your visit to [facility], please contact our office at [phone] and follow the prompts.  Our office hours are 8:00 a.m. and 4:30 p.m. Monday - Friday.  Please note that voicemails left after 4:00 p.m. may not be returned until the following business  day.  We are closed weekends and major holidays.  You do have access to a nurse 24-7, just call the main number to the clinic [phone] and do not press any options, hold on the line and a nurse will answer the phone.    For prescription refill requests, have your pharmacy contact our office and allow 72 hours.    Due to Covid, you will need to wear a mask upon entering the hospital. If you do not have a mask, a mask will be given to you at the Main Entrance upon arrival. For doctor visits, patients may have 1 support person age 26 or older with them. For treatment visits, patients can not have anyone with them due to social distancing guidelines and our immunocompromised population.

## 2024-01-30 LAB — CA 125: Cancer Antigen (CA) 125: 54.2 U/mL — ABNORMAL HIGH (ref 0.0–38.1)

## 2024-01-31 ENCOUNTER — Telehealth: Payer: Self-pay

## 2024-01-31 NOTE — Telephone Encounter (Signed)
 Spoke with [name] regarding her referral to GYN oncology. She has an appointment scheduled with Dr. Orvil Bland on 5/30 at 11:15. Patient agrees to date and time. She has been provided with office address and location. She is also aware of our mask and visitor policy. Patient verbalized understanding and will call with any questions.

## 2024-01-31 NOTE — Telephone Encounter (Signed)
 LVM for Abigail Wagner to call office regarding scheduling a new patient appointment with Dr.Tucker on 5/30.

## 2024-02-01 ENCOUNTER — Other Ambulatory Visit: Payer: Self-pay | Admitting: Family Medicine

## 2024-02-01 ENCOUNTER — Ambulatory Visit: Admitting: Cardiology

## 2024-02-01 MED ORDER — OXYCODONE-ACETAMINOPHEN 7.5-325 MG PO TABS: 1.0000 | ORAL_TABLET | Freq: Four times a day (QID) | ORAL | 0 refills | Status: DC | PRN

## 2024-02-05 ENCOUNTER — Telehealth: Payer: Self-pay

## 2024-02-05 ENCOUNTER — Telehealth: Payer: Self-pay | Admitting: *Deleted

## 2024-02-05 NOTE — Telephone Encounter (Signed)
 Copied from CRM [id number]. Topic: Clinical - Prescription Issue >> Feb 05, 2024 11:51 AM Felizardo Hotter wrote: Reason for CRM: Pt's husband Zaineb Palas called on behalf of pt. Pt needs Furosemide  40 mg tablet. Pt has taken medication before. Please call pt's husband at [phone].

## 2024-02-05 NOTE — Telephone Encounter (Signed)
 Spoke with the patient's husband and moved appt on 5/30 from 11:15 am to 9:45 am.

## 2024-02-06 ENCOUNTER — Other Ambulatory Visit: Payer: Self-pay

## 2024-02-06 ENCOUNTER — Emergency Department (HOSPITAL_COMMUNITY)

## 2024-02-06 ENCOUNTER — Encounter (HOSPITAL_COMMUNITY): Payer: Self-pay | Admitting: Primary Care

## 2024-02-06 ENCOUNTER — Encounter (HOSPITAL_COMMUNITY): Payer: Self-pay

## 2024-02-06 ENCOUNTER — Inpatient Hospital Stay (HOSPITAL_COMMUNITY)
Admission: EM | Admit: 2024-02-06 | Discharge: 2024-02-11 | DRG: 291 | Disposition: A | Discharge status: Hospice | Attending: Internal Medicine | Admitting: Internal Medicine

## 2024-02-06 DIAGNOSIS — Z515 Encounter for palliative care: Secondary | ICD-10-CM | POA: Diagnosis not present

## 2024-02-06 DIAGNOSIS — I509 Heart failure, unspecified: Secondary | ICD-10-CM

## 2024-02-06 DIAGNOSIS — Z66 Do not resuscitate: Secondary | ICD-10-CM | POA: Diagnosis present

## 2024-02-06 DIAGNOSIS — Z85048 Personal history of other malignant neoplasm of rectum, rectosigmoid junction, and anus: Secondary | ICD-10-CM

## 2024-02-06 DIAGNOSIS — E785 Hyperlipidemia, unspecified: Secondary | ICD-10-CM | POA: Diagnosis present

## 2024-02-06 DIAGNOSIS — I10 Essential (primary) hypertension: Secondary | ICD-10-CM | POA: Diagnosis present

## 2024-02-06 DIAGNOSIS — C569 Malignant neoplasm of unspecified ovary: Secondary | ICD-10-CM

## 2024-02-06 DIAGNOSIS — Z7982 Long term (current) use of aspirin: Secondary | ICD-10-CM

## 2024-02-06 DIAGNOSIS — I110 Hypertensive heart disease with heart failure: Principal | ICD-10-CM | POA: Diagnosis present

## 2024-02-06 DIAGNOSIS — F32A Depression, unspecified: Secondary | ICD-10-CM | POA: Diagnosis present

## 2024-02-06 DIAGNOSIS — C786 Secondary malignant neoplasm of retroperitoneum and peritoneum: Secondary | ICD-10-CM | POA: Diagnosis present

## 2024-02-06 DIAGNOSIS — E876 Hypokalemia: Secondary | ICD-10-CM | POA: Diagnosis not present

## 2024-02-06 DIAGNOSIS — I4891 Unspecified atrial fibrillation: Principal | ICD-10-CM | POA: Diagnosis present

## 2024-02-06 DIAGNOSIS — Z87891 Personal history of nicotine dependence: Secondary | ICD-10-CM

## 2024-02-06 DIAGNOSIS — K219 Gastro-esophageal reflux disease without esophagitis: Secondary | ICD-10-CM | POA: Diagnosis present

## 2024-02-06 DIAGNOSIS — D649 Anemia, unspecified: Secondary | ICD-10-CM | POA: Diagnosis present

## 2024-02-06 DIAGNOSIS — Z8543 Personal history of malignant neoplasm of ovary: Secondary | ICD-10-CM

## 2024-02-06 DIAGNOSIS — C762 Malignant neoplasm of abdomen: Secondary | ICD-10-CM | POA: Diagnosis present

## 2024-02-06 DIAGNOSIS — I480 Paroxysmal atrial fibrillation: Secondary | ICD-10-CM | POA: Diagnosis present

## 2024-02-06 DIAGNOSIS — C210 Malignant neoplasm of anus, unspecified: Secondary | ICD-10-CM | POA: Diagnosis present

## 2024-02-06 DIAGNOSIS — T501X5A Adverse effect of loop [high-ceiling] diuretics, initial encounter: Secondary | ICD-10-CM | POA: Diagnosis not present

## 2024-02-06 DIAGNOSIS — R451 Restlessness and agitation: Secondary | ICD-10-CM | POA: Diagnosis present

## 2024-02-06 DIAGNOSIS — F419 Anxiety disorder, unspecified: Secondary | ICD-10-CM | POA: Diagnosis present

## 2024-02-06 DIAGNOSIS — I5021 Acute systolic (congestive) heart failure: Secondary | ICD-10-CM | POA: Diagnosis not present

## 2024-02-06 DIAGNOSIS — Z7189 Other specified counseling: Secondary | ICD-10-CM

## 2024-02-06 DIAGNOSIS — Z79899 Other long term (current) drug therapy: Secondary | ICD-10-CM

## 2024-02-06 DIAGNOSIS — Z7901 Long term (current) use of anticoagulants: Secondary | ICD-10-CM | POA: Diagnosis not present

## 2024-02-06 DIAGNOSIS — I6522 Occlusion and stenosis of left carotid artery: Secondary | ICD-10-CM | POA: Diagnosis present

## 2024-02-06 DIAGNOSIS — I5033 Acute on chronic diastolic (congestive) heart failure: Secondary | ICD-10-CM | POA: Diagnosis present

## 2024-02-06 LAB — CBC WITH DIFFERENTIAL/PLATELET
Abs Immature Granulocytes: 0.06 10*3/uL (ref 0.00–0.07)
Basophils Absolute: 0.1 10*3/uL (ref 0.0–0.1)
Basophils Relative: 1 %
Eosinophils Absolute: 0 10*3/uL (ref 0.0–0.5)
Eosinophils Relative: 0 %
HCT: 29.9 % — ABNORMAL LOW (ref 36.0–46.0)
Hemoglobin: 9.6 g/dL — ABNORMAL LOW (ref 12.0–15.0)
Immature Granulocytes: 1 %
Lymphocytes Relative: 10 %
Lymphs Abs: 0.6 10*3/uL — ABNORMAL LOW (ref 0.7–4.0)
MCH: 32.2 pg (ref 26.0–34.0)
MCHC: 32.1 g/dL (ref 30.0–36.0)
MCV: 100.3 fL — ABNORMAL HIGH (ref 80.0–100.0)
Monocytes Absolute: 0.3 10*3/uL (ref 0.1–1.0)
Monocytes Relative: 5 %
Neutro Abs: 5.2 10*3/uL (ref 1.7–7.7)
Neutrophils Relative %: 83 %
Platelets: 394 10*3/uL (ref 150–400)
RBC: 2.98 MIL/uL — ABNORMAL LOW (ref 3.87–5.11)
RDW: 18.8 % — ABNORMAL HIGH (ref 11.5–15.5)
WBC: 6.2 10*3/uL (ref 4.0–10.5)
nRBC: 0.3 % — ABNORMAL HIGH (ref 0.0–0.2)

## 2024-02-06 LAB — BRAIN NATRIURETIC PEPTIDE: B Natriuretic Peptide: 1408 pg/mL — ABNORMAL HIGH (ref 0.0–100.0)

## 2024-02-06 LAB — COMPREHENSIVE METABOLIC PANEL WITH GFR
ALT: 15 U/L (ref 0–44)
AST: 22 U/L (ref 15–41)
Albumin: 2.9 g/dL — ABNORMAL LOW (ref 3.5–5.0)
Alkaline Phosphatase: 46 U/L (ref 38–126)
Anion gap: 9 (ref 5–15)
BUN: 11 mg/dL (ref 8–23)
CO2: 22 mmol/L (ref 22–32)
Calcium: 8.5 mg/dL — ABNORMAL LOW (ref 8.9–10.3)
Chloride: 110 mmol/L (ref 98–111)
Creatinine, Ser: 0.91 mg/dL (ref 0.44–1.00)
GFR, Estimated: >60 mL/min (ref >60)
Glucose, Bld: 84 mg/dL (ref 70–99)
Potassium: 3.9 mmol/L (ref 3.5–5.1)
Sodium: 141 mmol/L (ref 135–145)
Total Bilirubin: 0.4 mg/dL (ref 0.0–1.2)
Total Protein: 6.3 g/dL — ABNORMAL LOW (ref 6.5–8.1)

## 2024-02-06 LAB — TROPONIN I (HIGH SENSITIVITY)
Troponin I (High Sensitivity): 21 ng/L — ABNORMAL HIGH (ref <18)
Troponin I (High Sensitivity): 16 ng/L (ref <18)

## 2024-02-06 LAB — MRSA NEXT GEN BY PCR, NASAL: MRSA by PCR Next Gen: NOT DETECTED

## 2024-02-06 LAB — I-STAT CHEM 8, ED
BUN: 12 mg/dL (ref 8–23)
Calcium, Ion: 1.11 mmol/L — ABNORMAL LOW (ref 1.15–1.40)
Chloride: 110 mmol/L (ref 98–111)
Creatinine, Ser: 0.9 mg/dL (ref 0.44–1.00)
Glucose, Bld: 82 mg/dL (ref 70–99)
HCT: 31 % — ABNORMAL LOW (ref 36.0–46.0)
Hemoglobin: 10.5 g/dL — ABNORMAL LOW (ref 12.0–15.0)
Potassium: 4 mmol/L (ref 3.5–5.1)
Sodium: 142 mmol/L (ref 135–145)
TCO2: 22 mmol/L (ref 22–32)

## 2024-02-06 LAB — MAGNESIUM: Magnesium: 2 mg/dL (ref 1.7–2.4)

## 2024-02-06 LAB — TSH: TSH: 2.049 u[IU]/mL (ref 0.350–4.500)

## 2024-02-06 MED ORDER — ESCITALOPRAM OXALATE 10 MG PO TABS
20.0000 mg | ORAL_TABLET | Freq: Every day | ORAL | Status: DC
Start: 2024-02-07 — End: 2024-02-11
  Administered 2024-02-07 – 2024-02-11 (×5): 20 mg via ORAL
  Filled 2024-02-06 (×5): qty 2

## 2024-02-06 MED ORDER — ROSUVASTATIN CALCIUM 20 MG PO TABS
20.0000 mg | ORAL_TABLET | Freq: Every day | ORAL | Status: DC
Start: 2024-02-06 — End: 2024-02-11
  Administered 2024-02-06 – 2024-02-10 (×5): 20 mg via ORAL
  Filled 2024-02-06 (×5): qty 1

## 2024-02-06 MED ORDER — SODIUM CHLORIDE 0.9 % IV SOLN: 250.0000 mL | INTRAVENOUS | Status: AC | PRN

## 2024-02-06 MED ORDER — COLESTIPOL HCL 1 G PO TABS
2.0000 g | ORAL_TABLET | Freq: Every day | ORAL | Status: DC
  Administered 2024-02-07 – 2024-02-11 (×5): 2 g via ORAL
  Filled 2024-02-06 (×6): qty 2

## 2024-02-06 MED ORDER — DIVALPROEX SODIUM ER 500 MG PO TB24
500.0000 mg | ORAL_TABLET | Freq: Every day | ORAL | Status: DC
Start: 2024-02-06 — End: 2024-02-11
  Administered 2024-02-06 – 2024-02-10 (×5): 500 mg via ORAL
  Filled 2024-02-06 (×5): qty 1

## 2024-02-06 MED ORDER — LORAZEPAM 0.5 MG PO TABS
0.5000 mg | ORAL_TABLET | Freq: Three times a day (TID) | ORAL | Status: DC | PRN
  Administered 2024-02-06 – 2024-02-10 (×9): 0.5 mg via ORAL
  Filled 2024-02-06 (×9): qty 1

## 2024-02-06 MED ORDER — OXYCODONE-ACETAMINOPHEN 7.5-325 MG PO TABS
1.0000 | ORAL_TABLET | Freq: Four times a day (QID) | ORAL | Status: DC | PRN
  Administered 2024-02-07 – 2024-02-10 (×6): 1 via ORAL
  Filled 2024-02-06 (×6): qty 1

## 2024-02-06 MED ORDER — PANTOPRAZOLE SODIUM 40 MG PO TBEC
40.0000 mg | DELAYED_RELEASE_TABLET | Freq: Every day | ORAL | Status: DC
  Administered 2024-02-07 – 2024-02-11 (×5): 40 mg via ORAL
  Filled 2024-02-06 (×5): qty 1

## 2024-02-06 MED ORDER — APIXABAN 5 MG PO TABS
5.0000 mg | ORAL_TABLET | Freq: Two times a day (BID) | ORAL | Status: DC
Start: 2024-02-06 — End: 2024-02-11
  Administered 2024-02-06 – 2024-02-11 (×10): 5 mg via ORAL
  Filled 2024-02-06 (×10): qty 1

## 2024-02-06 MED ORDER — FUROSEMIDE 10 MG/ML IJ SOLN
20.0000 mg | Freq: Two times a day (BID) | INTRAMUSCULAR | Status: DC
  Administered 2024-02-06 – 2024-02-07 (×3): 20 mg via INTRAVENOUS
  Filled 2024-02-06 (×3): qty 2

## 2024-02-06 MED ORDER — PANCRELIPASE (LIP-PROT-AMYL) 36000-114000 UNITS PO CPEP
72000.0000 [IU] | ORAL_CAPSULE | Freq: Three times a day (TID) | ORAL | Status: DC
  Administered 2024-02-06 – 2024-02-11 (×13): 72000 [IU] via ORAL
  Filled 2024-02-06 (×13): qty 2

## 2024-02-06 MED ORDER — SODIUM CHLORIDE 0.9% FLUSH: 3.0000 mL | INTRAVENOUS | Status: DC | PRN

## 2024-02-06 MED ORDER — NICOTINE 7 MG/24HR TD PT24
7.0000 mg | MEDICATED_PATCH | Freq: Every day | TRANSDERMAL | Status: DC
  Administered 2024-02-06 – 2024-02-10 (×5): 7 mg via TRANSDERMAL
  Filled 2024-02-06 (×5): qty 1

## 2024-02-06 MED ORDER — ONDANSETRON HCL 4 MG/2ML IJ SOLN: 4.0000 mg | Freq: Four times a day (QID) | INTRAMUSCULAR | Status: DC | PRN

## 2024-02-06 MED ORDER — AMIODARONE HCL IN DEXTROSE 360-4.14 MG/200ML-% IV SOLN
60.0000 mg/h | INTRAVENOUS | Status: AC
  Administered 2024-02-06: 60 mg/h via INTRAVENOUS
  Filled 2024-02-06 (×2): qty 200

## 2024-02-06 MED ORDER — AMIODARONE LOAD VIA INFUSION
150.0000 mg | Freq: Once | INTRAVENOUS | Status: AC
  Administered 2024-02-06: 150 mg via INTRAVENOUS
  Filled 2024-02-06: qty 83.34

## 2024-02-06 MED ORDER — ACETAMINOPHEN 325 MG PO TABS
650.0000 mg | ORAL_TABLET | ORAL | Status: DC | PRN
  Administered 2024-02-08 – 2024-02-11 (×6): 650 mg via ORAL
  Filled 2024-02-06 (×6): qty 2

## 2024-02-06 MED ORDER — AMIODARONE HCL IN DEXTROSE 360-4.14 MG/200ML-% IV SOLN
30.0000 mg/h | INTRAVENOUS | Status: DC
  Administered 2024-02-06 – 2024-02-10 (×8): 30 mg/h via INTRAVENOUS
  Filled 2024-02-06 (×9): qty 200

## 2024-02-06 MED ORDER — SODIUM CHLORIDE 0.9% FLUSH
3.0000 mL | Freq: Two times a day (BID) | INTRAVENOUS | Status: DC
  Administered 2024-02-06 – 2024-02-11 (×11): 3 mL via INTRAVENOUS

## 2024-02-06 MED ORDER — CHLORHEXIDINE GLUCONATE CLOTH 2 % EX PADS
6.0000 | MEDICATED_PAD | Freq: Every day | CUTANEOUS | Status: DC
  Administered 2024-02-06 – 2024-02-10 (×5): 6 via TOPICAL

## 2024-02-06 MED ORDER — FUROSEMIDE 10 MG/ML IJ SOLN: 40.0000 mg | Freq: Two times a day (BID) | INTRAMUSCULAR | Status: DC

## 2024-02-06 NOTE — Progress Notes (Incomplete)
 Attending Note  Patient seen and discussed with PA Jorie Newness, I agree with her documentation. 73 yo female history of PAF on amiodarone , stage II anal cancer, stage III ovarian cancer followed by oncology with recent concerns for recurrent ovarian cancer, presents with leg edema and palpitations. Reports feeling of heart racing for 1-2 weeks, would check bp at home and reported HRs in the 130s. Over this time developed significant LE edema. She reports compliance with medicatiosn.    WBC 6.2 Hgb 9.6 Plt 394 TSH 2 BNP 1408 CMET pending Trop 21-->16 EKG afib with RVR 130s CXR probable bibasilar edema vs pneumonia, small left effusion    1.PAF - on amiodarone  200mg  daily at home - SR as recent as 01/01/24 EKG. Presents to ER in afib with RVR - start IV amio to reload and hopefully convert her back to SR - continue eliquis  for stroke prevention.    2.Acute HF, unknown type - BNP 1408, CXR probable edema - presented with leg edema - perhaps exacerbated by her afib with RVR - lasix  naive, start IV 20mg  bid to start.  - echo when rates better controlled  3. Ovarian cancer - followed by oncology - Diagnosed in 2015, status post debulking surgery followed by 6 cycles of carboplatin and Taxol completed in March 2016.  -recent concerns for recurrence.  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 - from onc note plans to repeat CT scan as outpatient.  - seen by palliative this admission   Armida Lander MD

## 2024-02-06 NOTE — Consult Note (Addendum)
 Cardiology Consultation   Patient ID: Abigail Wagner MRN: [id number]; DOB: [birth year]  Admit date: 02/06/2024 Date of Consult: 02/06/2024  PCP:  Austine Lefort, MD   [facility] Providers Cardiologist:  Teddie Favre, MD   {   Patient Profile:   Abigail Wagner is a 73 y.o. female with a hx of paroxysmal Afib (on Eliquis  and Amio), moderate Left carotid artery stenosis (US  06/2023), HLD, HTN,  previous treated anal cancer, recurrent ovarian cancer (followed by Dr. Cheree Cords), depression who is being seen 02/06/2024 for the evaluation of HF and afib with rvr at the request of Dr. Randal Bury.  History of Present Illness:   Abigail Wagner was last seen by Dr. Londa Rival 12/2022 for follow up visit. At that time, no cardiac complaints. She was recently completed abx course for UTI. Continued on Eliquis  5 mg BID, Amiodarone  200 mg daily, and Crestor  20 mg daily. Follow up US  Carotid showed no significant RICA stenosis with moderate 50 to 69% LICA stenosis.   Presented to ED today with concerns for LE edema, depression and feeling overwhelmed. CMP WNL except Albumin 2.9. TSH 2.049. Chronic Anemia Hgb 10.5 (baseline 9-11).  TN 21. BNP 1408 (no previous comparison). EKG showed afib, HR 133 with chronic Q waves in anterior leads (vs last EKG 01/01/2024 NSR with chronic Q waves in anterior).  CXR showed bibasilar edema or pneumonia noted with small left pleural effusion. CT abd/pelvic pending. Treated with Amio load and infusion.   On interview, patient is accompanied by husband. History is obtained from both patient and husband. Reported waking up with SOB and yellow sputum cough x 4-5 day associated with LE edema. She sleep laying on her left side with 1 pillow. Tried to use compression socks for edema but no relief. Denied any CP, palpitations, syncope, recent illness, dehydration, snoring.  Husband noted that HR has been elevated since last Thursday approx 110-120. Fluid intake daily include approx 2-3  glasses of water , mini Gatorade x1, and 2 cups of coffee. Smoke 1/2 PPD x 4-5 years. Has previously stopped and started tobacco use. Denies ETOH or drug use. Reported compliance to all medication.   Past Medical History:  Diagnosis Date   Allergy    Anal carcinoma (HCC)    Chemo and radiation   Anemia    Anxiety 1974   Dr. [name] at [place] (psychiatrist).     Blood transfusion without reported diagnosis 2017   Carotid artery disease (HCC)    Chronic kidney disease    DDD (degenerative disc disease), lumbosacral    GERD (gastroesophageal reflux disease) 2010   Headache(784.0)    History of adenomatous polyp of colon 10/07/2015   Tubular adenoma high grade dysplasia   History of herpes genitalis    History of kidney stones    History of suicide attempt 2010   HTN (hypertension)    Hyperlipidemia    IBS (irritable bowel syndrome)    Major depression    OA (osteoarthritis)    Ovarian cancer (HCC)    Stage IIIB papillary ovarian carcinoma  s/p omentectomy and BSO & chemotherapy (completed 12-07-2014)   Paroxysmal atrial fibrillation (HCC)    Prolapse of vaginal vault after hysterectomy 07/20/2015   Rectovaginal fistula 08/05/2015   Seasonal allergies    Sigmoid diverticulosis    Smokers' cough [facility])     Past Surgical History:  Procedure Laterality Date   BIOPSY  10/12/2020   Procedure: BIOPSY;  Surgeon: Urban Garden, MD;  [facility];  Service: Gastroenterology;;   BREAST ENHANCEMENT SURGERY  1986   BREAST SURGERY  1986   CESAREAN SECTION  1978   COLONOSCOPY N/A 10/07/2015   Procedure: COLONOSCOPY;  Surgeon: Ruby Corporal, MD;  [facility];  Service: Endoscopy;  Laterality: N/A;  7:30   COLONOSCOPY WITH PROPOFOL  N/A 10/12/2020   Procedure: COLONOSCOPY WITH PROPOFOL ;  Surgeon: Urban Garden, MD;  [facility];  Service: Gastroenterology;  Laterality: N/A;  9:00   COSMETIC SURGERY  1986   CYSTOSCOPY N/A 02/27/2022    Procedure: CYSTOSCOPY;  Surgeon: Marco Severs, MD;  Location: [facility];  Service: Urology;  Laterality: N/A;   CYSTOSCOPY WITH FULGERATION N/A 11/03/2021   Procedure: CYSTOSCOPY WITH FULGERATION;  Surgeon: Marco Severs, MD;  Location: [facility];  Service: Urology;  Laterality: N/A;   ESOPHAGOGASTRODUODENOSCOPY (EGD) WITH PROPOFOL  N/A 10/12/2020   Procedure: ESOPHAGOGASTRODUODENOSCOPY (EGD) WITH PROPOFOL ;  Surgeon: Urban Garden, MD;  [facility];  Service: Gastroenterology;  Laterality: N/A;   EXPLORATORY LAPAROTOMY/ OMENTECTOMY/  BILATERAL SALPINGOOPHORECTOMY/  PORT-A-CATH PLACEMENT  07-03-2014   [place]   KNEE ARTHROSCOPY Left 09/22/2004   LAPAROSCOPY N/A 06/02/2014   Procedure: DIAGNOSTIC LAPAROSCOPY, OMENTAL BIOPSY, RIGHT OVARY BIOPSY, LYSIS OF ADHESIONS;  Surgeon: Boyce Byes, MD;  Location: [facility];  Service: General;  Laterality: N/A;   MUCOSAL ADVANCEMENT FLAP N/A 06/15/2016   Procedure: EXCISION RECTOVAGINAOL FISTULA WITH MUCOSAL ADVANCEMENT FLAP;  Surgeon: Joyce Nixon, MD;  Location: [facility];  Service: General;  Laterality: N/A;   PLACEMENT OF SETON N/A 09/09/2015   Procedure: PLACEMENT OF SETON;  Surgeon: Joyce Nixon, MD;  Location: [facility];  Service: General;  Laterality: N/A;   PORT-A-CATH REMOVAL Right 08/17/2014   Procedure: REMOVAL INTRAPERITONEAL CHEMO PORT;  Surgeon: Boyce Byes, MD;  Location: [facility];  Service: General;  Laterality: Right;   PORTACATH PLACEMENT Right 08/17/2014   Procedure:  PLACE NEW PORT A CATH;  Surgeon: Boyce Byes, MD;  Location: [facility];  Service: General;  Laterality: Right;   RECTAL BIOPSY N/A 09/09/2015   Procedure: BIOPSY OF RECTOVAGINAL MASS;  Surgeon: Joyce Nixon, MD;  Location: [facility];  Service: General;  Laterality: N/A;   TRANSURETHRAL RESECTION OF BLADDER TUMOR N/A 02/27/2022   Procedure: TRANSURETHRAL  RESECTION OF BLADDER TUMOR (TURBT);  Surgeon: Marco Severs, MD;  Location: [facility];  Service: Urology;  Laterality: N/A;   TUBAL LIGATION  YRS AGO   VAGINAL HYSTERECTOMY  1981   fibroids     Home Medications:  Prior to Admission medications   Medication Sig Start Date End Date Taking? Authorizing Provider  amiodarone  (PACERONE ) 200 MG tablet TAKE 1 TABLET BY MOUTH EVERY DAY 06/29/23  Yes Gerard Knight, MD  Aspirin-Salicylamide-Caffeine [facility] HEADACHE POWDER PO) Take 1-2 Packages by mouth daily at 12 noon.   Yes [provider]  Cholecalciferol (VITAMIN D -3) 125 MCG (5000 UT) TABS Take 5,000 Units by mouth daily.   Yes [provider]  clotrimazole (LOTRIMIN) 1 % cream Apply 1 application topically 2 (two) times daily as needed (itching around ankles).   Yes [provider]  Coenzyme Q10 (CO Q 10) 100 MG CAPS Take 100 mg by mouth daily.   Yes [provider]  colestipol  (COLESTID ) 1 g tablet Take 2 tablets (2 g total) by mouth daily. 10/04/23  Yes Urban Garden, MD  divalproex  (DEPAKOTE  ER) 500 MG 24  hr tablet Take 1 tablet (500 mg total) by mouth daily. 09/03/23  Yes Alysia Bachelor, MD  ELIQUIS  5 MG TABS tablet TAKE 1 TABLET BY MOUTH TWICE A DAY 01/08/24  Yes Austine Lefort, MD  escitalopram  (LEXAPRO ) 20 MG tablet Take 1 tablet (20 mg total) by mouth daily. 09/03/23  Yes Alysia Bachelor, MD  estradiol  (ESTRACE ) 0.1 MG/GM vaginal cream Apply a pea size amount of cream to urethral area of vagina twice weekly 03/03/22  Yes Summerlin, Julienne Annette, PA-C  Ferrous Gluconate (IRON  27 PO) Take 65 mg by mouth daily. Pt is taking 65mg /325 daily   Yes [provider]  lipase/protease/amylase (CREON ) 36000 UNITS CPEP capsule Take 2 capsules (72,000 Units total) by mouth 3 (three) times daily before meals. Two capsules with meals and one with snacks. 10/23/23  Yes Urban Garden, MD  LORazepam  (ATIVAN ) 0.5 MG tablet Take 1 tablet  (0.5 mg total) by mouth 3 (three) times daily as needed for anxiety. 09/03/23  Yes Alysia Bachelor, MD  Multiple Vitamin (MULTIVITAMIN) tablet Take 1 tablet by mouth daily.   Yes [provider]  Multiple Vitamins-Minerals (HAIR SKIN & NAILS ADVANCED PO) Take 1 tablet by mouth 3 (three) times daily.   Yes [provider]  omeprazole  (PRILOSEC) 40 MG capsule TAKE 1 CAPSULE (40 MG TOTAL) BY MOUTH DAILY. 12/20/23  Yes Austine Lefort, MD  OVER THE COUNTER MEDICATION LYSENE 1 daily per husband   Yes [provider]  oxyCODONE -acetaminophen  (PERCOCET) 7.5-325 MG tablet Take 1 tablet by mouth every 6 (six) hours as needed for severe pain (pain score 7-10). This script should last 30 days.  60 per month 02/01/24  Yes Pickard, Cisco Crest, MD  potassium chloride  SA (KLOR-CON  M) 20 MEQ tablet Take 1 tablet (20 mEq total) by mouth daily. 01/29/24  Yes Paulett Boros, MD  rosuvastatin  (CRESTOR ) 20 MG tablet TAKE 1 TABLET BY MOUTH EVERY DAY 04/09/23  Yes Austine Lefort, MD    Inpatient Medications: Scheduled Meds:  apixaban   5 mg Oral BID   Chlorhexidine  Gluconate Cloth  6 each Topical Q0600   [START ON 02/07/2024] colestipol   2 g Oral Daily   divalproex   500 mg Oral Daily   [START ON 02/07/2024] escitalopram   20 mg Oral Daily   furosemide   20 mg Intravenous BID   lipase/protease/amylase  72,000 Units Oral TID AC   [START ON 02/07/2024] pantoprazole   40 mg Oral Daily   rosuvastatin   20 mg Oral Daily   sodium chloride  flush  3 mL Intravenous Q12H   Continuous Infusions:  sodium chloride      amiodarone  60 mg/hr (02/06/24 1031)   Followed by   amiodarone      PRN Meds: sodium chloride , acetaminophen , LORazepam , ondansetron  (ZOFRAN ) IV, oxyCODONE -acetaminophen , sodium chloride  flush  Allergies:    Allergies  Allergen Reactions   Morphine  And Codeine     Delirium with sbo    Social History:   Social History   Socioeconomic History   Marital status: Married    Spouse  name: Blaise Bumps   Number of children: 4   Years of education: 12   Highest education level: 12th grade  Occupational History   Occupation: retire    Comment: [employer]  Tobacco Use   Smoking status: Former    Current packs/day: 0.00    Average packs/day: 0.5 packs/day for 15.0 years (7.5 ttl pk-yrs)    Types: Cigarettes    Start date: 02/17/2003  Quit date: 02/16/2018    Years since quitting: 5.9   Smokeless tobacco: Never   Tobacco comments:    half pack a day for 15 yrs  Vaping Use   Vaping status: Never Used  Substance and Sexual Activity   Alcohol use: No   Drug use: No   Sexual activity: Not Currently    Birth control/protection: Surgical    Comment: hyst  Other Topics Concern   Not on file  Social History Narrative   Lives at home with Blaise Bumps   Retired [employer]   Married 02/16/1990   Several grandchildren    Social Drivers of Health   Financial Resource Strain: Low Risk  (09/07/2023)   Overall Financial Resource Strain (CARDIA)    Difficulty of Paying Living Expenses: Not hard at all  Food Insecurity: No Food Insecurity (02/06/2024)   Hunger Vital Sign    Worried About Running Out of Food in the Last Year: Never true    Ran Out of Food in the Last Year: Never true  Transportation Needs: No Transportation Needs (02/06/2024)   PRAPARE - Administrator, Civil Service (Medical): No    Lack of Transportation (Non-Medical): No  Physical Activity: Inactive (09/07/2023)   Exercise Vital Sign    Days of Exercise per Week: 0 days    Minutes of Exercise per Session: 20 min  Stress: Stress Concern Present (09/07/2023)   [employer] of Occupational Health - Occupational Stress Questionnaire    Feeling of Stress : To some extent  Social Connections: Moderately Integrated (02/06/2024)   Social Connection and Isolation Panel [NHANES]    Frequency of Communication with Friends and Family: More than three times a week    Frequency of Social Gatherings with  Friends and Family: Once a week    Attends Religious Services: 1 to 4 times per year    Active Member of [employer] or Organizations: No    Attends Banker Meetings: Never    Marital Status: Married  Catering manager Violence: Not At Risk (02/06/2024)   Humiliation, Afraid, Rape, and Kick questionnaire    Fear of Current or Ex-Partner: No    Emotionally Abused: No    Physically Abused: No    Sexually Abused: No    Family History:   Family History  Problem Relation Age of Onset   Bipolar disorder Mother    Anxiety disorder Mother    Dementia Mother    Depression Mother    Mental illness Mother    Vision loss Mother    Alzheimer's disease Mother    Alcohol abuse Paternal Uncle    Bipolar disorder Maternal Grandmother    Dementia Maternal Grandmother    Alzheimer's disease Maternal Grandmother    Alcohol abuse Paternal Uncle    Stroke Brother    Deep vein thrombosis Son    Heart disease Father    Hyperlipidemia Father    Hypertension Father    Stroke Father    Vision loss Father    Atrial fibrillation Sister    Heart disease Brother    Heart disease Brother    ADD / ADHD Neg Hx    Drug abuse Neg Hx    OCD Neg Hx    Paranoid behavior Neg Hx    Schizophrenia Neg Hx    Seizures Neg Hx    Sexual abuse Neg Hx    Physical abuse Neg Hx      ROS:  Please see the history of present  illness.  All other ROS reviewed and negative.     Physical Exam/Data:   Vitals:   02/06/24 1025 02/06/24 1040 02/06/24 1045 02/06/24 1102  BP:   (!) 122/92   Pulse: (!) 140 (!) 121 (!) 119   Resp: 16 15 11    Temp:      TempSrc:      SpO2: 93% 95% 91%   Weight:    59.8 kg  Height:    5\' 6"  (1.676 m)   No intake or output data in the 24 hours ending 02/06/24 1201    02/06/2024   11:02 AM 01/29/2024    9:45 AM 01/18/2024    3:44 PM  Last 3 Weights  Weight (lbs) 131 lb 13.4 oz 121 lb 111 lb 8 oz  Weight (kg) 59.8 kg 54.885 kg 50.576 kg     Body mass index is 21.28 kg/m.   General:  Sitting upright in bed, in no acute distress HEENT: normal Neck: +JVD Vascular: No carotid bruits; Distal pulses 2+ bilaterally Cardiac:  irregular irregular; no murmur  Lungs:  diminished in bilateral bases, no wheezing, rhonchi or rales  Abd: soft, nontender, no hepatomegaly  Ext:2+ pitting edema, wearing home compression stockings.  Musculoskeletal:  No deformities, BUE and BLE strength normal and equal Skin: warm and dry  Neuro:  CNs 2-12 intact, no focal abnormalities noted Psych:  Normal affect   EKG:  The EKG was personally reviewed and demonstrates:  Afib, HR 133 with chronic Q waves in anterior leads (vs last EKG 01/01/2024 NSR with chronic Q waves in anterior Telemetry:  Telemetry was personally reviewed and demonstrates:  Afib, Hr 110-120 with highest episode of 140  Relevant CV Studies: ECHO IMPRESSIONS 05/2021  1. Left ventricular ejection fraction, by estimation, is 60 to 65%. The  left ventricle has normal function. The left ventricle has no regional  wall motion abnormalities. Left ventricular diastolic parameters are  consistent with Grade II diastolic  dysfunction (pseudonormalization).   2. Right ventricular systolic function is normal. The right ventricular  size is mildly enlarged. There is normal pulmonary artery systolic  pressure. The estimated right ventricular systolic pressure is 26.4 mmHg.   3. The mitral valve is normal in structure. Trivial mitral valve  regurgitation. No evidence of mitral stenosis.   4. Tricuspid valve regurgitation is moderate.   5. The aortic valve is tricuspid. Aortic valve regurgitation is trivial.  Mild to moderate aortic valve sclerosis/calcification is present, without  any evidence of aortic stenosis.   6. The inferior vena cava is normal in size with greater than 50%  respiratory variability, suggesting right atrial pressure of 3 mmHg.   US  carotid 06/2023 IMPRESSION: Right: Color duplex indicates moderate  heterogeneous and calcified plaque, with no hemodynamically significant stenosis by duplex criteria in the extracranial cerebrovascular circulation.   Left: Heterogeneous and partially calcified plaque at the left carotid bifurcation, with discordant results regarding degree of stenosis by established duplex criteria. Peak velocity suggests 50%-69% stenosis, with the ICA/ CCA ratio suggesting a lesser degree of stenosis. If establishing a more accurate degree of stenosis is required, cerebral angiogram should be considered, or as a second best test, CTA.  Laboratory Data:  High Sensitivity Troponin:   Recent Labs  Lab 02/06/24 0837 02/06/24 1049  TROPONINIHS 21* 16     Chemistry Recent Labs  Lab 02/06/24 0837 02/06/24 0845  NA 141 142  K 3.9 4.0  CL 110 110  CO2 22  --   GLUCOSE  84 82  BUN 11 12  CREATININE 0.91 0.90  CALCIUM  8.5*  --   GFRNONAA >60  --   ANIONGAP 9  --     Recent Labs  Lab 02/06/24 0837  PROT 6.3*  ALBUMIN 2.9*  AST 22  ALT 15  ALKPHOS 46  BILITOT 0.4   Lipids No results for input(s): "CHOL", "TRIG", "HDL", "LABVLDL", "LDLCALC", "CHOLHDL" in the last 168 hours.  Hematology Recent Labs  Lab 02/06/24 0837 02/06/24 0845  WBC 6.2  --   RBC 2.98*  --   HGB 9.6* 10.5*  HCT 29.9* 31.0*  MCV 100.3*  --   MCH 32.2  --   MCHC 32.1  --   RDW 18.8*  --   PLT 394  --    Thyroid   Recent Labs  Lab 02/06/24 0837  TSH 2.049    BNP Recent Labs  Lab 02/06/24 0837  BNP 1,408.0*    DDimer No results for input(s): "DDIMER" in the last 168 hours.   Radiology/Studies:  DG Chest Port 1 View Result Date: 02/06/2024 CLINICAL DATA:  Bilateral lower extremity swelling, palpitations. EXAM: PORTABLE CHEST 1 VIEW COMPARISON:  January 02, 2024. FINDINGS: Stable cardiomediastinal silhouette. Right internal jugular Port-A-Cath is unchanged. Probable bibasilar pulmonary edema or pneumonia is noted with small left pleural effusion. Bony thorax is  unremarkable. IMPRESSION: Probable bibasilar edema or pneumonia is noted with small left pleural effusion. Electronically Signed   By: Rosalene Colon M.D.   On: 02/06/2024 08:40     Assessment and Plan:   Afib with RVR  - EKG showed A-fib with RVR, HR 133. Telemetry this a.m. showed A-fib, HR 110-120's with highest episode of 140. Currently, denies any palpitations or dizziness.  - Chronic Anemia Hgb 10.5 (baseline 9-11), WBC WNL, TSH WNL, K 4, Mg pending (in setting of arrhythmia, goal K > 4, Mg>2)  - denied any recent infections, dehydration, known sleep apnea, excessive etoh/caffeine, drug use  - CHADVASC 5. Denied any active bleeding or falls. Continue Eliquis  5 mg BID. -  ECHO pending. Continue Amio IV. - If remains in afib with IV amio, can consider inpatient DCCV since complaint with AC. Will discuss with MD.    Elevated BNP Acute CHF exacerbation  - ECHO 05/2021: 60-65%, G2DD, trivial MVR, moderate TVR, trivial AVR, mild to moderate AV calcification w/o AS, L/R atria normal size.  - Reported orthopnea and LE edema x 4-5 days. Not relieved with elevation or compression socks. - Appears volume overloaded on exam.  - Cr 0.9.  IV lasix  20 mg BID ordered by admitting team. May need to titrate.  - Monitor I/O, weight and renal function - ECHO pending   HLD  Left carotid artery stenosis  - 06/2023 carotid US : no significant RICA stenosis with moderate 50 to 69% LICA stenosis.  - 06/2023: LDL 49 - Continue Crestor  20 mg  - Continue to follow up. Repeat Carotid US  in 06/2024   Risk Assessment/Risk Scores:   New York  Heart Association (NYHA) Functional Class NYHA Class IV  CHA2DS2-VASc Score = 5  This indicates a 7.2% annual risk of stroke. The patient's score is based upon: CHF History: 1 HTN History: 1 Diabetes History: 0 Stroke History: 0 Vascular Disease History: 1 Age Score: 1 Gender Score: 1       For questions or updates, please contact   [facility] Please consult [email or web address] for contact info under    Signed, Metta Actis, PA-C  02/06/2024 12:01  PM   Attending Note   Patient seen and discussed with PA Jorie Newness, I agree with her documentation. 73 yo female history of PAF on amiodarone , stage II anal cancer, stage III ovarian cancer followed by oncology with recent concerns for recurrent ovarian cancer, presents with leg edema and palpitations. Reports feeling of heart racing for 1-2 weeks, would check bp at home and reported HRs in the 130s. Over this time developed significant LE edema. She reports compliance with medicatiosn.       WBC 6.2 Hgb 9.6 Plt 394 TSH 2 BNP 1408 CMET pending Trop 21-->16 EKG afib with RVR 130s CXR probable bibasilar edema vs pneumonia, small left effusion       1.PAF - on amiodarone  200mg  daily at home - SR as recent as 01/01/24 EKG. Presents to ER in afib with RVR - start IV amio to reload and hopefully convert her back to SR - continue eliquis  for stroke prevention.      2.Acute HF, unknown type - BNP 1408, CXR probable edema - presented with leg edema - perhaps exacerbated by her afib with RVR - lasix  naive, start IV 20mg  bid to start.  - echo when rates better controlled   3. Ovarian cancer - followed by oncology - Diagnosed in 2015, status post debulking surgery followed by 6 cycles of carboplatin and Taxol completed in March 2016.  -recent concerns for recurrence.  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 - from onc note plans to repeat CT scan as outpatient.  - seen by palliative this admission     Armida Lander MD

## 2024-02-06 NOTE — ED Triage Notes (Signed)
 Pt c/o bilateral leg swelling and palpitations x couple of days. Also c/o palpitations and "high heart rate" pt endorses Hx of a fib and cancer and states she recently had a PET scan done and they told her they found something on her PET scan and pt states "I am just tired, I want to go home to be with God"  pt does appear to be in A fib RVR, EDP at bedside when pt made statement about wanting to die and EDP given EKG

## 2024-02-06 NOTE — ED Notes (Signed)
 Both sets of blood cultures obtained before any ABX administration

## 2024-02-06 NOTE — TOC Initial Note (Signed)
 Transition of Care [facility]) - Initial/Assessment Note    Patient Details  Name: Abigail Wagner MRN: [id number] Date of Birth: 01-21-1951  [facility]) CM/SW Contact:    Geraldina Klinefelter, RN Phone Number: 02/06/2024, 8:32 PM  Clinical Narrative:                 Pt c/complaint of weakness, palpitations, BLE swelling c/cancer diagnosis followed by Cheree Cords. Palliative consult completed. Pt has agreed to 'treat the treatable'. She and husband are undecided about home hospice at this time. TOC to follow.   Expected Discharge Plan: Home w Hospice Care Barriers to Discharge: Continued Medical Work up (Pt has opted for 'treat the treatable'. Medical team working to stabilize heart rate.)  Patient Goals and CMS Choice Patient states their goals for this hospitalization and ongoing recovery are:: To return home CMS Medicare.gov Compare Post Acute Care list provided to:: Patient Choice offered to / list presented to : Patient [place] ownership interest in [facility].provided to:: Patient   Expected Discharge Plan and Services In-house Referral: Clinical Social Work, Hospice / Palliative Care Discharge Planning Services: CM Consult Post Acute Care Choice: Hospice Living arrangements for the past 2 months: Single Family Home   Prior Living Arrangements/Services Living arrangements for the past 2 months: Single Family Home Lives with:: Spouse Patient language and need for interpreter reviewed:: Yes        Need for Family Participation in Patient Care: Yes (Comment) Care giver support system in place?: Yes (comment)   Criminal Activity/Legal Involvement Pertinent to Current Situation/Hospitalization: No - Comment as needed  Activities of Daily Living   ADL Screening (condition at time of admission) Independently performs ADLs?: Yes (appropriate for developmental age) Is the patient deaf or have difficulty hearing?: No Does the patient have difficulty seeing, even when  wearing glasses/contacts?: No Does the patient have difficulty concentrating, remembering, or making decisions?: No  Permission Sought/Granted Permission sought to share information with : Case Manager, Family Supports, Oceanographer granted to share information with : Yes, Verbal Permission Granted    Emotional Assessment   Affect (typically observed): Calm, Appropriate Orientation: : Oriented to Self, Oriented to Situation, Oriented to Place Alcohol / Substance Use: Not Applicable Psych Involvement: No (comment)  Admission diagnosis:  Atrial fibrillation with RVR (HCC) [I48.91] Patient Active Problem List   Diagnosis Date Noted   Pulmonary infiltrates on CXR 10/31/2023   Cigarette smoker 10/31/2023   History of herpes genitalis    Abdominal bruit 09/18/2023   History of radiation therapy 12/05/2022   Atrial fibrillation with RVR (HCC) 06/28/2021   Recurrent Diverticulitis 06/28/2021   Poor appetite 06/28/2021   AKI (acute kidney injury) (HCC) 06/28/2021   Chronic diarrhea 10/06/2020   Chronic prescription opiate use 10/06/2020   SBO (small bowel obstruction) (HCC) 05/25/2020   Vulvar irritation 03/10/2020   Internal carotid artery stenosis, left    Neural foraminal stenosis of lumbar spine 05/16/2018   Genetic testing 05/24/2017   Vaginal infection 09/27/2016   Essential hypertension 09/27/2016   GERD (gastroesophageal reflux disease) 09/27/2016   Back pain 09/27/2016   Anxiety 09/27/2016   Allergy 09/27/2016   Radiation proctitis 12/23/2015   Abnormal weight loss 12/15/2015   Anal cancer (HCC) 11/19/2015   Dehydration 11/10/2015   History of ovarian cancer 08/03/2015   Prolapse of vaginal vault after hysterectomy 07/20/2015   Port-A-Cath in place 01/13/2015   Nausea with vomiting 10/23/2014   Epithelial ovarian cancer, FIGO stage  IIIA (HCC) 07/16/2014   Abdominal carcinomatosis (HCC) 06/18/2014   Bipolar 1 disorder (HCC) 12/27/2011    Kidney stones 12/27/2011   Depression 11/07/2011   PCP:  Austine Lefort, MD Pharmacy:   [phone] - [place] [place] - [address] AT [facility] [address] [place] [address] Phone: [phone] Fax: [phone]  Social Drivers of Health (SDOH) Social History: SDOH Screenings   Food Insecurity: No Food Insecurity (02/06/2024)  Housing: Low Risk  (02/06/2024)  Transportation Needs: No Transportation Needs (02/06/2024)  Utilities: Not At Risk (02/06/2024)  Alcohol Screen: Low Risk  (09/07/2023)  Depression (PHQ2-9): Medium Risk (01/01/2024)  Financial Resource Strain: Low Risk  (09/07/2023)  Physical Activity: Inactive (09/07/2023)  Social Connections: Moderately Integrated (02/06/2024)  Stress: Stress Concern Present (09/07/2023)  Tobacco Use: Medium Risk (02/06/2024)   SDOH Interventions:  Readmission Risk Interventions    02/06/2024    8:18 PM 06/30/2021    9:52 AM 06/30/2021    9:51 AM  Readmission Risk Prevention Plan  Transportation Screening Complete  Complete  PCP or Specialist Appt within 5-7 Days Complete    Home Care Screening Complete Patient refused Complete  Medication Review (RN CM) Complete  Complete

## 2024-02-06 NOTE — ED Provider Notes (Signed)
 [place] EMERGENCY DEPARTMENT AT [facility] Provider Note   CSN: [id number] Arrival date & time: 02/06/24  0741     History  Chief Complaint  Patient presents with   Leg Swelling    Abigail Wagner is a 73 y.o. female.  She has a history of anal cancer and now is with recurrent ovarian cancer.  Follows with Dr. Katragadda and is supposed to be following up with Esperanza Hedges.  Also has A-fib on Eliquis  and amiodarone .  She is complaining of feeling depressed and overwhelmed.  Tearful.  Denies any specific pain at this time but she is gets pain sometimes.  She denies chest pain or shortness of breath at this time.  She has noticed swelling in her legs and that causes her difficulty walking.  She has been compliant with her medications.  She says she sees a psychiatrist but has not seen them in a while.  The history is provided by the patient and the spouse.  Mental Health Problem Presenting symptoms: depression   Patient accompanied by:  Family member Degree of incapacity (severity):  Unable to specify Onset quality:  Gradual Progression:  Worsening Context: stressful life event   Associated symptoms: abdominal pain (sometimes)   Associated symptoms: no chest pain        Home Medications Prior to Admission medications   Medication Sig Start Date End Date Taking? Authorizing Provider  amiodarone  (PACERONE ) 200 MG tablet TAKE 1 TABLET BY MOUTH EVERY DAY 06/29/23   McDowell, Samuel G, MD  Aspirin-Salicylamide-Caffeine [facility] HEADACHE POWDER PO) Take 1-2 Packages by mouth daily at 12 noon.    [provider]  Cholecalciferol (VITAMIN D -3) 125 MCG (5000 UT) TABS Take 5,000 Units by mouth daily.    [provider]  clotrimazole (LOTRIMIN) 1 % cream Apply 1 application topically 2 (two) times daily as needed (itching around ankles).    [provider]  Coenzyme Q10 (CO Q 10) 100 MG CAPS Take 100 mg by mouth daily.    [provider]  colestipol   (COLESTID ) 1 g tablet Take 2 tablets (2 g total) by mouth daily. 10/04/23   Castaneda Mayorga, Daniel, MD  divalproex  (DEPAKOTE  ER) 500 MG 24 hr tablet Take 1 tablet (500 mg total) by mouth daily. 09/03/23   Alysia Bachelor, MD  ELIQUIS  5 MG TABS tablet TAKE 1 TABLET BY MOUTH TWICE A DAY 01/08/24   Austine Lefort, MD  escitalopram  (LEXAPRO ) 20 MG tablet Take 1 tablet (20 mg total) by mouth daily. 09/03/23   Alysia Bachelor, MD  estradiol  (ESTRACE ) 0.1 MG/GM vaginal cream Apply a pea size amount of cream to urethral area of vagina twice weekly 03/03/22   Summerlin, Julienne Annette, PA-C  Ferrous Gluconate (IRON  27 PO) Take 65 mg by mouth daily. Pt is taking 65mg /325 daily    [provider]  lipase/protease/amylase (CREON ) 36000 UNITS CPEP capsule Take 2 capsules (72,000 Units total) by mouth 3 (three) times daily before meals. Two capsules with meals and one with snacks. 10/23/23   Castaneda Mayorga, Daniel, MD  LORazepam  (ATIVAN ) 0.5 MG tablet Take 1 tablet (0.5 mg total) by mouth 3 (three) times daily as needed for anxiety. 09/03/23   Alysia Bachelor, MD  Multiple Vitamin (MULTIVITAMIN) tablet Take 1 tablet by mouth daily.    [provider]  Multiple Vitamins-Minerals (HAIR SKIN & NAILS ADVANCED PO) Take 3 tablets by mouth daily.    [provider]  omeprazole  (PRILOSEC) 40  MG capsule TAKE 1 CAPSULE (40 MG TOTAL) BY MOUTH DAILY. 12/20/23   Austine Lefort, MD  OVER THE COUNTER MEDICATION LYSENE 1 daily per husband    [provider]  oxyCODONE -acetaminophen  (PERCOCET) 7.5-325 MG tablet Take 1 tablet by mouth every 6 (six) hours as needed for severe pain (pain score 7-10). This script should last 30 days.  60 per month 02/01/24   Austine Lefort, MD  potassium chloride  SA (KLOR-CON  M) 20 MEQ tablet Take 1 tablet (20 mEq total) by mouth daily. 01/29/24   Paulett Boros, MD  rosuvastatin  (CRESTOR ) 20 MG tablet TAKE 1 TABLET BY MOUTH EVERY DAY 04/09/23   Austine Lefort, MD      Allergies    Morphine  and codeine    Review of Systems   Review of Systems  Cardiovascular:  Negative for chest pain.  Gastrointestinal:  Positive for abdominal pain (sometimes).    Physical Exam Updated Vital Signs BP (!) 146/100   Pulse (!) 137   Temp 98.1 F (36.7 C) (Oral)   Resp 14   SpO2 96%  Physical Exam Vitals and nursing note reviewed.  Constitutional:      General: She is not in acute distress.    Appearance: Normal appearance. She is well-developed.  HENT:     Head: Normocephalic and atraumatic.  Eyes:     Conjunctiva/sclera: Conjunctivae normal.  Cardiovascular:     Rate and Rhythm: Tachycardia present. Rhythm irregular.     Heart sounds: No murmur heard. Pulmonary:     Effort: Pulmonary effort is normal. No respiratory distress.     Breath sounds: Normal breath sounds. No stridor. No wheezing.  Abdominal:     Palpations: Abdomen is soft.     Tenderness: There is no abdominal tenderness. There is no guarding or rebound.  Musculoskeletal:        General: Tenderness present.     Cervical back: Neck supple.     Right lower leg: Edema present.     Left lower leg: Edema present.  Skin:    General: Skin is warm and dry.  Neurological:     General: No focal deficit present.     Mental Status: She is alert.     GCS: GCS eye subscore is 4. GCS verbal subscore is 5. GCS motor subscore is 6.  Psychiatric:        Mood and Affect: Mood is depressed. Affect is tearful.     ED Results / Procedures / Treatments   Labs (all labs ordered are listed, but only abnormal results are displayed) Labs Reviewed  COMPREHENSIVE METABOLIC PANEL WITH GFR - Abnormal; Notable for the following components:      Result Value   Calcium  8.5 (*)    Total Protein 6.3 (*)    Albumin 2.9 (*)    All other components within normal limits  CBC WITH DIFFERENTIAL/PLATELET - Abnormal; Notable for the following components:   RBC 2.98 (*)    Hemoglobin 9.6 (*)    HCT  29.9 (*)    MCV 100.3 (*)    RDW 18.8 (*)    nRBC 0.3 (*)    Lymphs Abs 0.6 (*)    All other components within normal limits  BRAIN NATRIURETIC PEPTIDE - Abnormal; Notable for the following components:   B Natriuretic Peptide 1,408.0 (*)    All other components within normal limits  I-STAT CHEM 8, ED - Abnormal; Notable for the following components:   Calcium , Ion 1.11 (*)  Hemoglobin 10.5 (*)    HCT 31.0 (*)    All other components within normal limits  TROPONIN I (HIGH SENSITIVITY) - Abnormal; Notable for the following components:   Troponin I (High Sensitivity) 21 (*)    All other components within normal limits  MRSA NEXT GEN BY PCR, NASAL  CULTURE, BLOOD (ROUTINE X 2)  CULTURE, BLOOD (ROUTINE X 2)  TSH  MAGNESIUM   TROPONIN I (HIGH SENSITIVITY)    EKG EKG Interpretation Date/Time:  Wednesday Feb 06 2024 07:53:53 EDT Ventricular Rate:  133 PR Interval:    QRS Duration:  78 QT Interval:  278 QTC Calculation: 414 R Axis:   68  Text Interpretation: Atrial fibrillation Anterior infarct, old Nonspecific repol abnormality, lateral leads increased rate and afib new from prior 4/25 Confirmed by Racheal Buddle [phone]) on 02/06/2024 8:09:10 AM  Radiology DG Chest Port 1 View Result Date: 02/06/2024 CLINICAL DATA:  Bilateral lower extremity swelling, palpitations. EXAM: PORTABLE CHEST 1 VIEW COMPARISON:  January 02, 2024. FINDINGS: Stable cardiomediastinal silhouette. Right internal jugular Port-A-Cath is unchanged. Probable bibasilar pulmonary edema or pneumonia is noted with small left pleural effusion. Bony thorax is unremarkable. IMPRESSION: Probable bibasilar edema or pneumonia is noted with small left pleural effusion. Electronically Signed   By: Rosalene Colon M.D.   On: 02/06/2024 08:40    Procedures .Critical Care  Performed by: Tonya Fredrickson, MD Authorized by: Tonya Fredrickson, MD   Critical care provider statement:    Critical care time (minutes):  45    Critical care time was exclusive of:  Separately billable procedures and treating other patients   Critical care was necessary to treat or prevent imminent or life-threatening deterioration of the following conditions:  Cardiac failure   Critical care was time spent personally by me on the following activities:  Development of treatment plan with patient or surrogate, discussions with consultants, evaluation of patient's response to treatment, examination of patient, obtaining history from patient or surrogate, ordering and performing treatments and interventions, ordering and review of laboratory studies, ordering and review of radiographic studies, pulse oximetry, re-evaluation of patient's condition and review of old charts   I assumed direction of critical care for this patient from another provider in my specialty: no       Medications Ordered in ED Medications  amiodarone  (NEXTERONE ) 1.8 mg/mL load via infusion 150 mg (150 mg Intravenous Bolus from Bag 02/06/24 1032)    Followed by  amiodarone  (NEXTERONE  PREMIX) 360-4.14 MG/200ML-% (1.8 mg/mL) IV infusion (60 mg/hr Intravenous New Bag/Given 02/06/24 1031)    Followed by  amiodarone  (NEXTERONE  PREMIX) 360-4.14 MG/200ML-% (1.8 mg/mL) IV infusion (30 mg/hr Intravenous New Bag/Given 02/06/24 1619)  Chlorhexidine  Gluconate Cloth 2 % PADS 6 each (6 each Topical Given 02/06/24 1104)  furosemide  (LASIX ) injection 20 mg (20 mg Intravenous Given 02/06/24 1208)  oxyCODONE -acetaminophen  (PERCOCET) 7.5-325 MG per tablet 1 tablet (has no administration in time range)  colestipol  (COLESTID ) tablet 2 g (has no administration in time range)  rosuvastatin  (CRESTOR ) tablet 20 mg (20 mg Oral Given 02/06/24 1617)  escitalopram  (LEXAPRO ) tablet 20 mg (has no administration in time range)  LORazepam  (ATIVAN ) tablet 0.5 mg (has no administration in time range)  pantoprazole  (PROTONIX ) EC tablet 40 mg (has no administration in time range)  lipase/protease/amylase  (CREON ) capsule 72,000 Units (72,000 Units Oral Given 02/06/24 1617)  apixaban  (ELIQUIS ) tablet 5 mg (has no administration in time range)  divalproex  (DEPAKOTE  ER) 24 hr tablet 500 mg (500 mg Oral  Given 02/06/24 1617)  sodium chloride  flush (NS) 0.9 % injection 3 mL (3 mLs Intravenous Given 02/06/24 1208)  sodium chloride  flush (NS) 0.9 % injection 3 mL (has no administration in time range)  0.9 %  sodium chloride  infusion (has no administration in time range)  acetaminophen  (TYLENOL ) tablet 650 mg (has no administration in time range)  ondansetron  (ZOFRAN ) injection 4 mg (has no administration in time range)    ED Course/ Medical Decision Making/ A&P Clinical Course as of 02/06/24 1652  Wed Feb 06, 2024  1015 Discussed with Dr. Amanda Jungling cardiology who said he would consult on the patient to make recommendations but probably amiodarone  infusion and would need stepdown.  She was also seen by NP Dove from palliative who will follow along.  Patient agreeable to admission. [MB]  1024 I reviewed workup with patient and she is agreeable to stay in the hospital.  Dr. Amanda Jungling is ordered her to get some IV amiodarone .  Discussed with Dr. Mason Sole [facility] hospitalist who will evaluate her for admission. [MB]    Clinical Course User Index [MB] Tonya Fredrickson, MD                                 Medical Decision Making Amount and/or Complexity of Data Reviewed Labs: ordered. Radiology: ordered.  Risk Decision regarding hospitalization.   This patient complains of feeling depressed and overwhelmed by recurrent cancer diagnosis, shortness of breath leg swelling; this involves an extensive number of treatment Options and is a complaint that carries with it a high risk of complications and morbidity. The differential includes A-fib, CHF, metabolic derangement, depression, suicidality, DVT, PE  I ordered, reviewed and interpreted labs, which included CBC with normal white count chronically low hemoglobin,  chemistries fairly unremarkable, BNP elevated, troponins flat I ordered imaging studies which included chest x-ray and I independently    visualized and interpreted imaging which showed bibasilar effusions infiltrate versus edema Additional history obtained from patient's husband Previous records obtained and reviewed in epic including recent oncology notes I consulted cardiology Dr. Amanda Jungling, NP Dov with palliative and [facility] hospitalist Dr. Mason Sole and discussed lab and imaging findings and discussed disposition.  Cardiac monitoring reviewed, A-fib with RVR Social determinants considered, physically inactive stress Critical Interventions: Discussion with cardiology and initiation of rate control with IV amiodarone   After the interventions stated above, I reevaluated the patient and found patient still to be fairly symptomatic Admission and further testing considered, she would benefit from [facility] the hospital where cardiology can help stabilize her cardiopulmonary status and patient can discuss with palliative goals of care         Final Clinical Impression(s) / ED Diagnoses Final diagnoses:  Atrial fibrillation with RVR (HCC)  Acute congestive heart failure, unspecified heart failure type (HCC)  Malignant neoplasm of ovary, unspecified laterality [facility])    Rx / DC Orders ED Discharge Orders     None         Tonya Fredrickson, MD 02/06/24 1658

## 2024-02-06 NOTE — H&P (Addendum)
 History and Physical    Abigail Wagner:[id number] DOB: 13-May-1951 DOA: 02/06/2024  PCP: Austine Lefort, MD   Patient coming from: Home  Chief Complaint: Lower extremity edema  HPI: Abigail Wagner is a 73 y.o. female with medical history significant for paroxysmal atrial fibrillation on Eliquis , dyslipidemia, hypertension, previously treated anal cancer, recurrent ovarian cancer, chronic diastolic heart failure, carotid artery stenosis, and depression who presented to the ED with complaints of worsening lower extremity edema as well as depression and overwhelm.  She denies any specific pain to include chest pain and even denies any shortness of breath, but has had worsening lower extremity swelling in the last 3-4 days and now has difficulty ambulating and worsening weakness.  She has been compliant with her home medications.  According to EDP, she states that she wanted to die and is tired of her medical management for her cancer and favors transition to hospice soon once her heart rates are better controlled.   ED Course: Vital signs demonstrating tachyarrhythmia confirmed on EKG with atrial fibrillation with RVR.  Hemoglobin 10.5 and troponin 21 with BNP 1408.  Chest x-ray with edema and left-sided pleural effusion.  She has been started on IV amiodarone  for heart rate control and cardiology consulted as well as palliative care for further evaluation and management.  Review of Systems: Reviewed as noted above, otherwise negative.  Past Medical History:  Diagnosis Date   Allergy    Anal carcinoma (HCC)    Chemo and radiation   Anemia    Anxiety 1974   Dr. [name] at [place] (psychiatrist).     Blood transfusion without reported diagnosis 2017   Carotid artery disease (HCC)    Chronic kidney disease    DDD (degenerative disc disease), lumbosacral    GERD (gastroesophageal reflux disease) 2010   Headache(784.0)    History of adenomatous polyp of colon 10/07/2015   Tubular adenoma high  grade dysplasia   History of herpes genitalis    History of kidney stones    History of suicide attempt 2010   HTN (hypertension)    Hyperlipidemia    IBS (irritable bowel syndrome)    Major depression    OA (osteoarthritis)    Ovarian cancer (HCC)    Stage IIIB papillary ovarian carcinoma  s/p omentectomy and BSO & chemotherapy (completed 12-07-2014)   Paroxysmal atrial fibrillation (HCC)    Prolapse of vaginal vault after hysterectomy 07/20/2015   Rectovaginal fistula 08/05/2015   Seasonal allergies    Sigmoid diverticulosis    Smokers' cough [facility])     Past Surgical History:  Procedure Laterality Date   BIOPSY  10/12/2020   Procedure: BIOPSY;  Surgeon: Urban Garden, MD;  [facility];  Service: Gastroenterology;;   BREAST ENHANCEMENT SURGERY  1986   BREAST SURGERY  1986   CESAREAN SECTION  1978   COLONOSCOPY N/A 10/07/2015   Procedure: COLONOSCOPY;  Surgeon: Ruby Corporal, MD;  [facility];  Service: Endoscopy;  Laterality: N/A;  7:30   COLONOSCOPY WITH PROPOFOL  N/A 10/12/2020   Procedure: COLONOSCOPY WITH PROPOFOL ;  Surgeon: Urban Garden, MD;  [facility];  Service: Gastroenterology;  Laterality: N/A;  9:00   COSMETIC SURGERY  1986   CYSTOSCOPY N/A 02/27/2022   Procedure: CYSTOSCOPY;  Surgeon: Marco Severs, MD;  Location: [facility];  Service: Urology;  Laterality: N/A;   CYSTOSCOPY WITH FULGERATION N/A 11/03/2021   Procedure: CYSTOSCOPY WITH FULGERATION;  Surgeon: Marco Severs,  MD;  Location: [facility];  Service: Urology;  Laterality: N/A;   ESOPHAGOGASTRODUODENOSCOPY (EGD) WITH PROPOFOL  N/A 10/12/2020   Procedure: ESOPHAGOGASTRODUODENOSCOPY (EGD) WITH PROPOFOL ;  Surgeon: Urban Garden, MD;  [facility];  Service: Gastroenterology;  Laterality: N/A;   EXPLORATORY LAPAROTOMY/ OMENTECTOMY/  BILATERAL SALPINGOOPHORECTOMY/  PORT-A-CATH PLACEMENT  07-03-2014   [place]   KNEE  ARTHROSCOPY Left 09/22/2004   LAPAROSCOPY N/A 06/02/2014   Procedure: DIAGNOSTIC LAPAROSCOPY, OMENTAL BIOPSY, RIGHT OVARY BIOPSY, LYSIS OF ADHESIONS;  Surgeon: Boyce Byes, MD;  Location: [facility];  Service: General;  Laterality: N/A;   MUCOSAL ADVANCEMENT FLAP N/A 06/15/2016   Procedure: EXCISION RECTOVAGINAOL FISTULA WITH MUCOSAL ADVANCEMENT FLAP;  Surgeon: Joyce Nixon, MD;  Location: [facility];  Service: General;  Laterality: N/A;   PLACEMENT OF SETON N/A 09/09/2015   Procedure: PLACEMENT OF SETON;  Surgeon: Joyce Nixon, MD;  Location: [facility];  Service: General;  Laterality: N/A;   PORT-A-CATH REMOVAL Right 08/17/2014   Procedure: REMOVAL INTRAPERITONEAL CHEMO PORT;  Surgeon: Boyce Byes, MD;  Location: [facility];  Service: General;  Laterality: Right;   PORTACATH PLACEMENT Right 08/17/2014   Procedure:  PLACE NEW PORT A CATH;  Surgeon: Boyce Byes, MD;  Location: [facility];  Service: General;  Laterality: Right;   RECTAL BIOPSY N/A 09/09/2015   Procedure: BIOPSY OF RECTOVAGINAL MASS;  Surgeon: Joyce Nixon, MD;  Location: [facility];  Service: General;  Laterality: N/A;   TRANSURETHRAL RESECTION OF BLADDER TUMOR N/A 02/27/2022   Procedure: TRANSURETHRAL RESECTION OF BLADDER TUMOR (TURBT);  Surgeon: Marco Severs, MD;  Location: [facility];  Service: Urology;  Laterality: N/A;   TUBAL LIGATION  YRS AGO   VAGINAL HYSTERECTOMY  1981   fibroids     reports that she quit smoking about 5 years ago. Her smoking use included cigarettes. She started smoking about 20 years ago. She has a 7.5 pack-year smoking history. She has never used smokeless tobacco. She reports that she does not drink alcohol and does not use drugs.  Allergies  Allergen Reactions   Morphine  And Codeine     Delirium with sbo    Family History  Problem Relation Age of Onset   Bipolar disorder Mother    Anxiety disorder Mother     Dementia Mother    Depression Mother    Mental illness Mother    Vision loss Mother    Alzheimer's disease Mother    Alcohol abuse Paternal Uncle    Bipolar disorder Maternal Grandmother    Dementia Maternal Grandmother    Alzheimer's disease Maternal Grandmother    Alcohol abuse Paternal Uncle    Stroke Brother    Deep vein thrombosis Son    Heart disease Father    Hyperlipidemia Father    Hypertension Father    Stroke Father    Vision loss Father    Atrial fibrillation Sister    Heart disease Brother    Heart disease Brother    ADD / ADHD Neg Hx    Drug abuse Neg Hx    OCD Neg Hx    Paranoid behavior Neg Hx    Schizophrenia Neg Hx    Seizures Neg Hx    Sexual abuse Neg Hx    Physical abuse Neg Hx     Prior to Admission medications   Medication Sig Start Date End Date Taking? Authorizing Provider  amiodarone  (PACERONE ) 200 MG tablet TAKE 1 TABLET BY MOUTH  EVERY DAY 06/29/23  Yes Gerard Knight, MD  Aspirin-Salicylamide-Caffeine [facility] HEADACHE POWDER PO) Take 1-2 Packages by mouth daily at 12 noon.   Yes [provider]  Cholecalciferol (VITAMIN D -3) 125 MCG (5000 UT) TABS Take 5,000 Units by mouth daily.   Yes [provider]  clotrimazole (LOTRIMIN) 1 % cream Apply 1 application topically 2 (two) times daily as needed (itching around ankles).   Yes [provider]  Coenzyme Q10 (CO Q 10) 100 MG CAPS Take 100 mg by mouth daily.   Yes [provider]  colestipol  (COLESTID ) 1 g tablet Take 2 tablets (2 g total) by mouth daily. 10/04/23  Yes Urban Garden, MD  divalproex  (DEPAKOTE  ER) 500 MG 24 hr tablet Take 1 tablet (500 mg total) by mouth daily. 09/03/23  Yes Alysia Bachelor, MD  ELIQUIS  5 MG TABS tablet TAKE 1 TABLET BY MOUTH TWICE A DAY 01/08/24  Yes Austine Lefort, MD  escitalopram  (LEXAPRO ) 20 MG tablet Take 1 tablet (20 mg total) by mouth daily. 09/03/23  Yes Alysia Bachelor, MD  estradiol  (ESTRACE ) 0.1 MG/GM vaginal  cream Apply a pea size amount of cream to urethral area of vagina twice weekly 03/03/22  Yes Summerlin, Julienne Annette, PA-C  Ferrous Gluconate (IRON  27 PO) Take 65 mg by mouth daily. Pt is taking 65mg /325 daily   Yes [provider]  lipase/protease/amylase (CREON ) 36000 UNITS CPEP capsule Take 2 capsules (72,000 Units total) by mouth 3 (three) times daily before meals. Two capsules with meals and one with snacks. 10/23/23  Yes Urban Garden, MD  LORazepam  (ATIVAN ) 0.5 MG tablet Take 1 tablet (0.5 mg total) by mouth 3 (three) times daily as needed for anxiety. 09/03/23  Yes Alysia Bachelor, MD  Multiple Vitamin (MULTIVITAMIN) tablet Take 1 tablet by mouth daily.   Yes [provider]  Multiple Vitamins-Minerals (HAIR SKIN & NAILS ADVANCED PO) Take 1 tablet by mouth 3 (three) times daily.   Yes [provider]  omeprazole  (PRILOSEC) 40 MG capsule TAKE 1 CAPSULE (40 MG TOTAL) BY MOUTH DAILY. 12/20/23  Yes Austine Lefort, MD  OVER THE COUNTER MEDICATION LYSENE 1 daily per husband   Yes [provider]  oxyCODONE -acetaminophen  (PERCOCET) 7.5-325 MG tablet Take 1 tablet by mouth every 6 (six) hours as needed for severe pain (pain score 7-10). This script should last 30 days.  60 per month 02/01/24  Yes Pickard, Cisco Crest, MD  potassium chloride  SA (KLOR-CON  M) 20 MEQ tablet Take 1 tablet (20 mEq total) by mouth daily. 01/29/24  Yes Paulett Boros, MD  rosuvastatin  (CRESTOR ) 20 MG tablet TAKE 1 TABLET BY MOUTH EVERY DAY 04/09/23  Yes Austine Lefort, MD    Physical Exam: Vitals:   02/06/24 1025 02/06/24 1040 02/06/24 1045 02/06/24 1102  BP:   (!) 122/92   Pulse: (!) 140 (!) 121 (!) 119   Resp: 16 15 11    Temp:      TempSrc:      SpO2: 93% 95% 91%   Weight:    59.8 kg  Height:    5\' 6"  (1.676 m)    Constitutional: NAD, calm, comfortable Vitals:   02/06/24 1025 02/06/24 1040 02/06/24 1045 02/06/24 1102  BP:   (!) 122/92   Pulse: (!) 140 (!) 121  (!) 119   Resp: 16 15 11    Temp:      TempSrc:      SpO2: 93% 95% 91%   Weight:  59.8 kg  Height:    5\' 6"  (1.676 m)   Eyes: lids and conjunctivae normal Neck: normal, supple Respiratory: clear to auscultation bilaterally. Normal respiratory effort. No accessory muscle use.  Cardiovascular: Irregular and tachycardic. Abdomen: no tenderness, no distention. Bowel sounds positive.  Musculoskeletal: 2+ pitting edema present bilaterally to the knees Skin: no rashes, lesions, ulcers.  Psychiatric: Flat affect  Labs on Admission: I have personally reviewed following labs and imaging studies  CBC: Recent Labs  Lab 02/06/24 0837 02/06/24 0845  WBC 6.2  --   NEUTROABS 5.2  --   HGB 9.6* 10.5*  HCT 29.9* 31.0*  MCV 100.3*  --   PLT 394  --    Basic Metabolic Panel: Recent Labs  Lab 02/06/24 0845  NA 142  K 4.0  CL 110  GLUCOSE 82  BUN 12  CREATININE 0.90   GFR: Estimated Creatinine Clearance: 52.1 mL/min (by C-G formula based on SCr of 0.9 mg/dL). Liver Function Tests: No results for input(s): "AST", "ALT", "ALKPHOS", "BILITOT", "PROT", "ALBUMIN" in the last 168 hours. No results for input(s): "LIPASE", "AMYLASE" in the last 168 hours. No results for input(s): "AMMONIA" in the last 168 hours. Coagulation Profile: No results for input(s): "INR", "PROTIME" in the last 168 hours. Cardiac Enzymes: No results for input(s): "CKTOTAL", "CKMB", "CKMBINDEX", "TROPONINI" in the last 168 hours. BNP (last 3 results) No results for input(s): "PROBNP" in the last 8760 hours. HbA1C: No results for input(s): "HGBA1C" in the last 72 hours. CBG: No results for input(s): "GLUCAP" in the last 168 hours. Lipid Profile: No results for input(s): "CHOL", "HDL", "LDLCALC", "TRIG", "CHOLHDL", "LDLDIRECT" in the last 72 hours. Thyroid  Function Tests: Recent Labs    02/06/24 0837  TSH 2.049   Anemia Panel: No results for input(s): "VITAMINB12", "FOLATE", "FERRITIN", "TIBC", "IRON ",  "RETICCTPCT" in the last 72 hours. Urine analysis:    Component Value Date/Time   COLORURINE DARK YELLOW 10/13/2021 1206   APPEARANCEUR Clear 02/13/2022 1014   LABSPEC 1.020 10/13/2021 1206   PHURINE 6.0 10/13/2021 1206   GLUCOSEU Negative 02/13/2022 1014   HGBUR NEGATIVE 10/13/2021 1206   BILIRUBINUR Negative 02/13/2022 1014   KETONESUR NEGATIVE 10/13/2021 1206   PROTEINUR 2+ (A) 02/13/2022 1014   PROTEINUR TRACE (A) 10/13/2021 1206   UROBILINOGEN 0.2 05/27/2014 1428   NITRITE Negative 02/13/2022 1014   NITRITE NEGATIVE 10/13/2021 1206   LEUKOCYTESUR 1+ (A) 02/13/2022 1014   LEUKOCYTESUR NEGATIVE 10/13/2021 1206    Radiological Exams on Admission: DG Chest Port 1 View Result Date: 02/06/2024 CLINICAL DATA:  Bilateral lower extremity swelling, palpitations. EXAM: PORTABLE CHEST 1 VIEW COMPARISON:  January 02, 2024. FINDINGS: Stable cardiomediastinal silhouette. Right internal jugular Port-A-Cath is unchanged. Probable bibasilar pulmonary edema or pneumonia is noted with small left pleural effusion. Bony thorax is unremarkable. IMPRESSION: Probable bibasilar edema or pneumonia is noted with small left pleural effusion. Electronically Signed   By: Rosalene Colon M.D.   On: 02/06/2024 08:40    EKG: Independently reviewed.  Atrial fibrillation with RVR 133 bpm  Assessment/Plan Principal Problem:   Atrial fibrillation with RVR (HCC) Active Problems:   Depression   History of ovarian cancer   Anal cancer (HCC)   Essential hypertension   Anxiety   Abdominal carcinomatosis (HCC)    Symptomatic atrial fibrillation with RVR - Started on IV amiodarone  for heart rate control, monitor on telemetry - Appreciate cardiology evaluation - TSH 2.049  Acute diastolic CHF exacerbation secondary to above - Prior 2D echocardiogram  05/2021 with LVEF 60-65% and grade 2 diastolic dysfunction - Noted to have significant BNP elevation and plan will be for diuresis with Lasix  20 mg IV twice  daily -Follow strict I's and O's and daily weights - Consider echo once heart rates are better controlled  Dyslipidemia/carotid artery stenosis - Continue Crestor   Hypertension - Does not appear to be on any home antihypertensives - Monitor closely with diuresis  Recurrent ovarian cancer now with concern for peritoneal carcinomatosis - With prior history of anal cancer - Desires hospice care at home soon - Appreciate discussions with palliative care  Depression/anxiety - Continue home medications  GERD - Continue PPI   DVT prophylaxis: Eliquis  Code Status: DNR Family Communication: Husband at bedside 5/14 Disposition Plan: Admit for atrial fibrillation with RVR Consults called: Cardiology, palliative Admission status: Inpatient, SDU  Severity of Illness: The appropriate patient status for this patient is INPATIENT. Inpatient status is judged to be reasonable and necessary in order to provide the required intensity of service to ensure the patient's safety. The patient's presenting symptoms, physical exam findings, and initial radiographic and laboratory data in the context of their chronic comorbidities is felt to place them at high risk for further clinical deterioration. Furthermore, it is not anticipated that the patient will be medically stable for discharge from the hospital within 2 midnights of admission.   * I certify that at the point of admission it is my clinical judgment that the patient will require inpatient hospital care spanning beyond 2 midnights from the point of admission due to high intensity of service, high risk for further deterioration and high frequency of surveillance required.*   Melannie Metzner D Maysen Bonsignore DO [facility]  If 7PM-7AM, please contact night-coverage [email or web address]  02/06/2024, 11:42 AM

## 2024-02-06 NOTE — ED Notes (Signed)
 EDP aware of EKG and HR

## 2024-02-06 NOTE — Plan of Care (Signed)

## 2024-02-06 NOTE — Consult Note (Signed)
 Consultation Note Date: 02/06/2024   Patient Name: Abigail Wagner  DOB: 12/08/1950  MRN: [id number]  Age / Sex: 73 y.o., female  PCP: Austine Lefort, MD Referring Physician: Tonya Fredrickson, MD  Reason for Consultation: Establishing goals of care  HPI/Patient Profile: 73 y.o. female  with extensive past medical history of cancer including anal cancer, recurrent ovarian cancer (first diagnosed in 2015 w/debulking surgery and chemo/rad) followed with Dr. Cheree Cords and to follow-up with Gyn/Onc 5/30,  transurethral resection of bladder tumor 2023, history of adenoma high-grade dysplasia of colon, A-fib on Eliquis  and amiodarone , depression, anxiety, GERD, HTN/HLD, OA, admitted on 02/06/2024 with A-fib symptomatic.   Clinical Assessment and Goals of Care: I have reviewed medical records including EPIC notes, labs and imaging, received report from ED MD, assessed the patient.  Abigail Wagner, Abigail Wagner, is lying on the stretcher in the ED.  She appears acutely/chronically ill and frail, pale.  She greets me, making and somewhat keeping eye contact.  Although she is alert and oriented to person, place, month, she tells me that her husband of 60 years, Blaise Bumps, who is at bedside is the father of her daughter.  Blaise Bumps states that this is incorrect.  He states that Abigail Wagner has had periods of forgetfulness.  When I mention her recurrent cancer she states, "oh, no".  I apologized sharing that I thought she understood this as she had seen Dr. Cheree Cords on May 6.  Blaise Bumps states that they were aware of this, again Abigail Wagner has periods of confusion.  We meet at the bedside to discuss diagnosis prognosis, GOC, EOL wishes, disposition and options.  I introduced Palliative Medicine as specialized medical care for people living with serious illness. It focuses on providing relief from the symptoms and stress of a serious illness. The goal is to improve  quality of life for both the patient and the family.  We discussed a brief life review of the patient.  Abigail Wagner and Blaise Bumps have been married for 33 years.  She has been independent with ADLs and light housekeeping.  She has been working closely with Dr. Katragadda for cancer treatment.  Blaise Bumps states that over the last few months she has been stating that she is ready to die.  He states, "I cannot let that happen".   We then focused on their current illness.  We talk in detail about Abigail Wagner cancer burden and the treatment plan and options.  We talked about her autonomy.  I attempted to elicit values and goals of care important to the patient.  The difference between aggressive medical intervention and comfort care was considered in light of the patient's goals of care.  We talked about the choice for being admitted for optimization versus going home with hospice care.  Blaise Bumps states that his mother had hospice care at age 4.  We have much discussion about Abigail Wagner's cancer and the treatment options and her desire to not continue to seek treatment.  Blaise Bumps asks about follow-up with  gyn/onc at the end  of the month.  We talked about the benefits of this visit versus the information they would get.  Both Carolyn Cisco and Blaise Bumps endorse that she would not accept surgery.  The natural disease trajectory and expectations at EOL were discussed.  Advanced directives, concepts specific to code status, artifical feeding and hydration, and rehospitalization were considered and discussed.  We talked about the concept of "treat the treatable, but allow a natural passing".  After much discussion, Abigail Wagner and Blaise Bumps elected DNR.  Orders adjusted.  Hospice Care services outpatient were explained and offered.  We talked about the benefits of "treat the treatable" hospice care.  We talked about what is and is not provided.  At this point Mr. [name] are agreeable accept "treat the treatable" hospice care when health is  optimized.  Discussed the importance of continued conversation with family and the medical providers regarding overall plan of care and treatment options, ensuring decisions are within the context of the patient's values and GOCs.  Questions and concerns were addressed.  Hard Choices booklet left for review. The family was encouraged to call with questions or concerns.  PMT will continue to support holistically.  Conference with attending, cardiology, bedside nursing staff, transition of care team related to patient condition, needs, goals of care, disposition.   HCPOA  NEXT OF KIN -husband of 33 years, [name].  Abigail Wagner has a daughter, [name], from a previous marriage    SUMMARY OF RECOMMENDATIONS   Treat the treatable but no CPR or intubation Hospitalized to optimize health/comfort Declines any surgery Home with the benefits of hospice for "treat the treatable" care No further cancer workup or treatment, including gyn/onc appt 5/30    Code Status/Advance Care Planning: DNR -we talked about the concept of "treat the treatable, but allow a natural passing".   Symptom Management:  Per hospitalist, no additional needs at this time.  Palliative Prophylaxis:  Frequent Pain Assessment and Oral Care  Additional Recommendations (Limitations, Scope, Preferences): No CPR or intubation, no surgery, no further cancer workup or treatment  Psycho-social/Spiritual:  Desire for further Chaplaincy support:no Additional Recommendations: Caregiving  Support/Resources and Education on Hospice  Prognosis:  < 3 months -would not be surprising based on the chronic illness burden, advancing cancer.  Prognosis discussed with family with permission.  I share a diagram of what is normal and expected decline, in particular for those with cancer.  Discharge Planning: Home with Hospice      Primary Diagnoses: Present on Admission: **None**   I have reviewed the medical record, interviewed the  patient and family, and examined the patient. The following aspects are pertinent.  Past Medical History:  Diagnosis Date   Allergy    Anal carcinoma (HCC)    Chemo and radiation   Anemia    Anxiety 1974   Dr. [name] at [place] (psychiatrist).     Blood transfusion without reported diagnosis 2017   Carotid artery disease (HCC)    Chronic kidney disease    DDD (degenerative disc disease), lumbosacral    GERD (gastroesophageal reflux disease) 2010   Headache(784.0)    History of adenomatous polyp of colon 10/07/2015   Tubular adenoma high grade dysplasia   History of herpes genitalis    History of kidney stones    History of suicide attempt 2010   HTN (hypertension)    Hyperlipidemia    IBS (irritable bowel syndrome)    Major depression    OA (osteoarthritis)    Ovarian cancer (HCC)  Stage IIIB papillary ovarian carcinoma  s/p omentectomy and BSO & chemotherapy (completed 12-07-2014)   Paroxysmal atrial fibrillation (HCC)    Prolapse of vaginal vault after hysterectomy 07/20/2015   Rectovaginal fistula 08/05/2015   Seasonal allergies    Sigmoid diverticulosis    Smokers' cough (HCC)    Social History   Socioeconomic History   Marital status: Married    Spouse name: Blaise Bumps   Number of children: 4   Years of education: 12   Highest education level: 12th grade  Occupational History   Occupation: retire    Comment: [employer]  Tobacco Use   Smoking status: Former    Current packs/day: 0.00    Average packs/day: 0.5 packs/day for 15.0 years (7.5 ttl pk-yrs)    Types: Cigarettes    Start date: 02/17/2003    Quit date: 02/16/2018    Years since quitting: 5.9   Smokeless tobacco: Never   Tobacco comments:    half pack a day for 15 yrs  Vaping Use   Vaping status: Never Used  Substance and Sexual Activity   Alcohol use: No   Drug use: No   Sexual activity: Not Currently    Birth control/protection: Surgical    Comment: hyst  Other Topics Concern   Not on file   Social History Narrative   Lives at home with Blaise Bumps   Retired [employer]   Married 02/16/1990   Several grandchildren    Social Drivers of Health   Financial Resource Strain: Low Risk  (09/07/2023)   Overall Financial Resource Strain (CARDIA)    Difficulty of Paying Living Expenses: Not hard at all  Food Insecurity: No Food Insecurity (09/07/2023)   Hunger Vital Sign    Worried About Running Out of Food in the Last Year: Never true    Ran Out of Food in the Last Year: Never true  Transportation Needs: No Transportation Needs (09/07/2023)   PRAPARE - Administrator, Civil Service (Medical): No    Lack of Transportation (Non-Medical): No  Physical Activity: Inactive (09/07/2023)   Exercise Vital Sign    Days of Exercise per Week: 0 days    Minutes of Exercise per Session: 20 min  Stress: Stress Concern Present (09/07/2023)   [employer] of Occupational Health - Occupational Stress Questionnaire    Feeling of Stress : To some extent  Social Connections: Moderately Integrated (09/07/2023)   Social Connection and Isolation Panel [NHANES]    Frequency of Communication with Friends and Family: More than three times a week    Frequency of Social Gatherings with Friends and Family: Once a week    Attends Religious Services: 1 to 4 times per year    Active Member of [employer] or Organizations: No    Attends Engineer, structural: Never    Marital Status: Married   Family History  Problem Relation Age of Onset   Bipolar disorder Mother    Anxiety disorder Mother    Dementia Mother    Depression Mother    Mental illness Mother    Vision loss Mother    Alzheimer's disease Mother    Alcohol abuse Paternal Uncle    Bipolar disorder Maternal Grandmother    Dementia Maternal Grandmother    Alzheimer's disease Maternal Grandmother    Alcohol abuse Paternal Uncle    Stroke Brother    Deep vein thrombosis Son    Heart disease Father    Hyperlipidemia Father     Hypertension Father  Stroke Father    Vision loss Father    Atrial fibrillation Sister    Heart disease Brother    Heart disease Brother    ADD / ADHD Neg Hx    Drug abuse Neg Hx    OCD Neg Hx    Paranoid behavior Neg Hx    Schizophrenia Neg Hx    Seizures Neg Hx    Sexual abuse Neg Hx    Physical abuse Neg Hx    Scheduled Meds: Continuous Infusions: PRN Meds:. Medications Prior to Admission:  Prior to Admission medications   Medication Sig Start Date End Date Taking? Authorizing Provider  amiodarone  (PACERONE ) 200 MG tablet TAKE 1 TABLET BY MOUTH EVERY DAY 06/29/23  Yes Gerard Knight, MD  Aspirin-Salicylamide-Caffeine [facility] HEADACHE POWDER PO) Take 1-2 Packages by mouth daily at 12 noon.   Yes [provider]  Cholecalciferol (VITAMIN D -3) 125 MCG (5000 UT) TABS Take 5,000 Units by mouth daily.   Yes [provider]  clotrimazole (LOTRIMIN) 1 % cream Apply 1 application topically 2 (two) times daily as needed (itching around ankles).   Yes [provider]  Coenzyme Q10 (CO Q 10) 100 MG CAPS Take 100 mg by mouth daily.   Yes [provider]  colestipol  (COLESTID ) 1 g tablet Take 2 tablets (2 g total) by mouth daily. 10/04/23  Yes Urban Garden, MD  divalproex  (DEPAKOTE  ER) 500 MG 24 hr tablet Take 1 tablet (500 mg total) by mouth daily. 09/03/23  Yes Alysia Bachelor, MD  ELIQUIS  5 MG TABS tablet TAKE 1 TABLET BY MOUTH TWICE A DAY 01/08/24  Yes Austine Lefort, MD  escitalopram  (LEXAPRO ) 20 MG tablet Take 1 tablet (20 mg total) by mouth daily. 09/03/23  Yes Alysia Bachelor, MD  estradiol  (ESTRACE ) 0.1 MG/GM vaginal cream Apply a pea size amount of cream to urethral area of vagina twice weekly 03/03/22  Yes Summerlin, Julienne Annette, PA-C  Ferrous Gluconate (IRON  27 PO) Take 65 mg by mouth daily. Pt is taking 65mg /325 daily   Yes [provider]  lipase/protease/amylase (CREON ) 36000 UNITS CPEP capsule Take 2 capsules (72,000  Units total) by mouth 3 (three) times daily before meals. Two capsules with meals and one with snacks. 10/23/23  Yes Urban Garden, MD  LORazepam  (ATIVAN ) 0.5 MG tablet Take 1 tablet (0.5 mg total) by mouth 3 (three) times daily as needed for anxiety. 09/03/23  Yes Alysia Bachelor, MD  Multiple Vitamin (MULTIVITAMIN) tablet Take 1 tablet by mouth daily.   Yes [provider]  Multiple Vitamins-Minerals (HAIR SKIN & NAILS ADVANCED PO) Take 1 tablet by mouth 3 (three) times daily.   Yes [provider]  omeprazole  (PRILOSEC) 40 MG capsule TAKE 1 CAPSULE (40 MG TOTAL) BY MOUTH DAILY. 12/20/23  Yes Austine Lefort, MD  OVER THE COUNTER MEDICATION LYSENE 1 daily per husband   Yes [provider]  oxyCODONE -acetaminophen  (PERCOCET) 7.5-325 MG tablet Take 1 tablet by mouth every 6 (six) hours as needed for severe pain (pain score 7-10). This script should last 30 days.  60 per month 02/01/24  Yes Pickard, Cisco Crest, MD  potassium chloride  SA (KLOR-CON  M) 20 MEQ tablet Take 1 tablet (20 mEq total) by mouth daily. 01/29/24  Yes Paulett Boros, MD  rosuvastatin  (CRESTOR ) 20 MG tablet TAKE 1 TABLET BY MOUTH EVERY DAY 04/09/23  Yes Austine Lefort, MD   Allergies  Allergen Reactions   Morphine  And Codeine  Delirium with sbo   Review of Systems  Unable to perform ROS: Acuity of condition    Physical Exam Vitals and nursing note reviewed.  Constitutional:      General: She is not in acute distress.    Appearance: She is ill-appearing.  Cardiovascular:     Rate and Rhythm: Tachycardia present.  Pulmonary:     Effort: Pulmonary effort is normal. No respiratory distress.  Musculoskeletal:        General: Swelling present.  Skin:    General: Skin is warm and dry.     Coloration: Skin is pale.  Neurological:     Mental Status: She is alert and oriented to person, place, and time.     Comments: Oriented x 3, person place and month, but incorrectly states her  husband of 33 years is her daughter's father.    Vital Signs: BP (!) 139/99   Pulse (!) 143   Temp 98.1 F (36.7 C) (Oral)   Resp 16   SpO2 93%  Pain Scale: 0-10   Pain Score: 0-No pain   SpO2: SpO2: 93 % O2 Device:SpO2: 93 % O2 Flow Rate: .   IO: Intake/output summary: No intake or output data in the 24 hours ending 02/06/24 0927  LBM:   Baseline Weight:   Most recent weight:       Palliative Assessment/Data:     Time In: 0900 Time Out: 1015 Time Total: 75 minutes  Greater than 50%  of this time was spent counseling and coordinating care related to the above assessment and plan.  Signed by: Annabelle Barrack, NP   Please contact Palliative Medicine Team phone at [phone] for questions and concerns.  For individual provider: See Tilford Foley

## 2024-02-07 ENCOUNTER — Inpatient Hospital Stay (HOSPITAL_COMMUNITY)

## 2024-02-07 DIAGNOSIS — I5021 Acute systolic (congestive) heart failure: Secondary | ICD-10-CM | POA: Diagnosis not present

## 2024-02-07 DIAGNOSIS — I4891 Unspecified atrial fibrillation: Secondary | ICD-10-CM | POA: Diagnosis not present

## 2024-02-07 DIAGNOSIS — Z515 Encounter for palliative care: Secondary | ICD-10-CM | POA: Diagnosis not present

## 2024-02-07 DIAGNOSIS — Z7189 Other specified counseling: Secondary | ICD-10-CM | POA: Diagnosis not present

## 2024-02-07 DIAGNOSIS — I509 Heart failure, unspecified: Secondary | ICD-10-CM

## 2024-02-07 LAB — ECHOCARDIOGRAM COMPLETE
Area-P 1/2: 5.97 cm2
Calc EF: 44.5 %
Est EF: 50
Height: 66 in
MV M vel: 5.04 m/s
MV Peak grad: 101.6 mmHg
P 1/2 time: 561 ms
Radius: 0.6 cm
S' Lateral: 2.95 cm
Single Plane A2C EF: 44.4 %
Single Plane A4C EF: 46.6 %
Weight: 2119.94 [oz_av]

## 2024-02-07 LAB — BASIC METABOLIC PANEL WITH GFR
Anion gap: 10 (ref 5–15)
BUN: 11 mg/dL (ref 8–23)
CO2: 25 mmol/L (ref 22–32)
Calcium: 8.4 mg/dL — ABNORMAL LOW (ref 8.9–10.3)
Chloride: 103 mmol/L (ref 98–111)
Creatinine, Ser: 0.79 mg/dL (ref 0.44–1.00)
GFR, Estimated: >60 mL/min (ref >60)
Glucose, Bld: 82 mg/dL (ref 70–99)
Potassium: 4.1 mmol/L (ref 3.5–5.1)
Sodium: 138 mmol/L (ref 135–145)

## 2024-02-07 LAB — TSH: TSH: 6.385 u[IU]/mL — ABNORMAL HIGH (ref 0.350–4.500)

## 2024-02-07 LAB — MAGNESIUM: Magnesium: 2 mg/dL (ref 1.7–2.4)

## 2024-02-07 MED ORDER — LORAZEPAM 0.5 MG PO TABS
0.5000 mg | ORAL_TABLET | Freq: Once | ORAL | Status: AC
  Administered 2024-02-07: 0.5 mg via ORAL
  Filled 2024-02-07: qty 1

## 2024-02-07 MED ORDER — FUROSEMIDE 10 MG/ML IJ SOLN
40.0000 mg | Freq: Two times a day (BID) | INTRAMUSCULAR | Status: DC
  Administered 2024-02-07 – 2024-02-11 (×8): 40 mg via INTRAVENOUS
  Filled 2024-02-07 (×8): qty 4

## 2024-02-07 MED ORDER — HALOPERIDOL LACTATE 5 MG/ML IJ SOLN
2.0000 mg | Freq: Once | INTRAMUSCULAR | Status: AC
  Administered 2024-02-07: 2 mg via INTRAMUSCULAR
  Filled 2024-02-07: qty 1

## 2024-02-07 MED ORDER — AMIODARONE LOAD VIA INFUSION
150.0000 mg | Freq: Once | INTRAVENOUS | Status: AC
  Administered 2024-02-07: 150 mg via INTRAVENOUS
  Filled 2024-02-07: qty 83.34

## 2024-02-07 MED ORDER — FUROSEMIDE 10 MG/ML IJ SOLN
20.0000 mg | Freq: Once | INTRAMUSCULAR | Status: AC
  Administered 2024-02-07: 20 mg via INTRAVENOUS
  Filled 2024-02-07: qty 2

## 2024-02-07 NOTE — Plan of Care (Signed)

## 2024-02-07 NOTE — Progress Notes (Signed)
 PROGRESS NOTE    Abigail Wagner  ZOX:[id number] DOB: 09/17/1951 DOA: 02/06/2024 PCP: Austine Lefort, MD   Brief Narrative:    Abigail Wagner is a 73 y.o. female with medical history significant for paroxysmal atrial fibrillation on Eliquis , dyslipidemia, hypertension, previously treated anal cancer, recurrent ovarian cancer, chronic diastolic heart failure, carotid artery stenosis, and depression who presented to the ED with complaints of worsening lower extremity edema as well as depression and overwhelm.  Patient was admitted with symptomatic atrial fibrillation with RVR as well as acute heart failure symptoms.  She is being aggressively diuresed and heart rate control being managed by cardiology with use of IV amiodarone  infusion.  Assessment & Plan:   Principal Problem:   Atrial fibrillation with RVR (HCC) Active Problems:   Depression   History of ovarian cancer   Anal cancer (HCC)   Essential hypertension   Anxiety   Abdominal carcinomatosis (HCC)  Assessment and Plan:   Symptomatic atrial fibrillation with RVR - Started on IV amiodarone  for heart rate control, monitor on telemetry -N.p.o. after midnight in case DCCV required by a.m. once more euvolemic - Appreciate cardiology evaluation - TSH 2.049, on repeat noted to be elevated over 6 and free T3 and T4 pending   Acute diastolic CHF exacerbation secondary to above - Prior 2D echocardiogram 05/2021 with LVEF 60-65% and grade 2 diastolic dysfunction - Noted to have significant BNP elevation and plan will be for diuresis with now Lasix  40 mg IV twice daily -Follow strict I's and O's and daily weights - Echo pending   Dyslipidemia/carotid artery stenosis - Continue Crestor    Hypertension - Does not appear to be on any home antihypertensives - Monitor closely with diuresis   Recurrent ovarian cancer now with concern for peritoneal carcinomatosis - With prior history of anal cancer - Desires hospice care at home soon -  Appreciate discussions with palliative care   Depression/anxiety - Continue home medications   GERD - Continue PPI    DVT prophylaxis:apixaban  Code Status: DNR Family Communication: None at bedside Disposition Plan:  Status is: Inpatient Remains inpatient appropriate because: Need for IV medications.   Consultants:  Cardiology Palliative care  Procedures:  None  Antimicrobials:  None   Subjective: Patient seen and evaluated today with ongoing elevated heart rates on amiodarone  infusion.  Noted to still have persistent lower extremity edema.  Appears to have been anxious and agitated overnight and nursing staff asking for sitter.  Objective: Vitals:   02/07/24 0500 02/07/24 0600 02/07/24 0700 02/07/24 0840  BP: 115/72 (!) 138/99 138/77   Pulse:  (!) 117 (!) 50   Resp: 14 17 (!) 22   Temp:    (!) 97.4 F (36.3 C)  TempSrc:    Axillary  SpO2:  100% 97%   Weight: 60.1 kg     Height:        Intake/Output Summary (Last 24 hours) at 02/07/2024 1044 Last data filed at 02/07/2024 1033 Gross per 24 hour  Intake 446.93 ml  Output 1400 ml  Net -953.07 ml   Filed Weights   02/06/24 1102 02/07/24 0500  Weight: 59.8 kg 60.1 kg    Examination:  General exam: Appears calm and comfortable  Respiratory system: Clear to auscultation. Respiratory effort normal. Cardiovascular system: S1 & S2 heard, irregular tachycardic Gastrointestinal system: Abdomen is soft Central nervous system: Alert and awake Extremities: Persistent 2+ bilateral edema to lower extremities Skin: No significant lesions noted Psychiatry: Flat affect.  Data Reviewed: I have personally reviewed following labs and imaging studies  CBC: Recent Labs  Lab 02/06/24 0837 02/06/24 0845  WBC 6.2  --   NEUTROABS 5.2  --   HGB 9.6* 10.5*  HCT 29.9* 31.0*  MCV 100.3*  --   PLT 394  --    Basic Metabolic Panel: Recent Labs  Lab 02/06/24 0837 02/06/24 0845 02/06/24 1049 02/07/24 0445  NA 141  142  --  138  K 3.9 4.0  --  4.1  CL 110 110  --  103  CO2 22  --   --  25  GLUCOSE 84 82  --  82  BUN 11 12  --  11  CREATININE 0.91 0.90  --  0.79  CALCIUM  8.5*  --   --  8.4*  MG  --   --  2.0 2.0   GFR: Estimated Creatinine Clearance: 58.6 mL/min (by C-G formula based on SCr of 0.79 mg/dL). Liver Function Tests: Recent Labs  Lab 02/06/24 0837  AST 22  ALT 15  ALKPHOS 46  BILITOT 0.4  PROT 6.3*  ALBUMIN 2.9*   No results for input(s): "LIPASE", "AMYLASE" in the last 168 hours. No results for input(s): "AMMONIA" in the last 168 hours. Coagulation Profile: No results for input(s): "INR", "PROTIME" in the last 168 hours. Cardiac Enzymes: No results for input(s): "CKTOTAL", "CKMB", "CKMBINDEX", "TROPONINI" in the last 168 hours. BNP (last 3 results) No results for input(s): "PROBNP" in the last 8760 hours. HbA1C: No results for input(s): "HGBA1C" in the last 72 hours. CBG: No results for input(s): "GLUCAP" in the last 168 hours. Lipid Profile: No results for input(s): "CHOL", "HDL", "LDLCALC", "TRIG", "CHOLHDL", "LDLDIRECT" in the last 72 hours. Thyroid  Function Tests: Recent Labs    02/07/24 0445  TSH 6.385*   Anemia Panel: No results for input(s): "VITAMINB12", "FOLATE", "FERRITIN", "TIBC", "IRON ", "RETICCTPCT" in the last 72 hours. Sepsis Labs: No results for input(s): "PROCALCITON", "LATICACIDVEN" in the last 168 hours.  Recent Results (from the past 240 hours)  Culture, blood (routine x 2)     Status: None (Preliminary result)   Collection Time: 02/06/24  9:38 AM   Specimen: BLOOD  Result Value Ref Range Status   Specimen Description BLOOD BLOOD RIGHT ARM  Final   Special Requests   Final    Blood Culture adequate volume BOTTLES DRAWN AEROBIC AND ANAEROBIC   Culture   Final    NO GROWTH < 24 HOURS Performed at [facility], [address] [place], [place] [address]    Report Status PENDING  Incomplete  Culture, blood (routine x 2)     Status: None  (Preliminary result)   Collection Time: 02/06/24  9:43 AM   Specimen: BLOOD  Result Value Ref Range Status   Specimen Description BLOOD BLOOD LEFT ARM  Final   Special Requests   Final    BOTTLES DRAWN AEROBIC AND ANAEROBIC Blood Culture adequate volume   Culture   Final    NO GROWTH < 24 HOURS Performed at [facility], [address] [place], [place] [address]    Report Status PENDING  Incomplete  MRSA Next Gen by PCR, Nasal     Status: None   Collection Time: 02/06/24 10:28 AM   Specimen: Nasal Mucosa; Nasal Swab  Result Value Ref Range Status   MRSA by PCR Next Gen NOT DETECTED NOT DETECTED Final    Comment: (NOTE) The GeneXpert MRSA Assay (FDA approved for NASAL specimens only), is  one component of a comprehensive MRSA colonization surveillance program. It is not intended to diagnose MRSA infection nor to guide or monitor treatment for MRSA infections. Test performance is not FDA approved in patients less than 49 years old. Performed at [facility], [address] [place], [place] [address]          Radiology Studies: [facility] Chest [facility] 1 View Result Date: 02/06/2024 CLINICAL DATA:  Bilateral lower extremity swelling, palpitations. EXAM: PORTABLE CHEST 1 VIEW COMPARISON:  January 02, 2024. FINDINGS: Stable cardiomediastinal silhouette. Right internal jugular Port-A-Cath is unchanged. Probable bibasilar pulmonary edema or pneumonia is noted with small left pleural effusion. Bony thorax is unremarkable. IMPRESSION: Probable bibasilar edema or pneumonia is noted with small left pleural effusion. Electronically Signed   By: Rosalene Colon M.D.   On: 02/06/2024 08:40        Scheduled Meds:  apixaban   5 mg Oral BID   Chlorhexidine  Gluconate Cloth  6 each Topical Q0600   colestipol   2 g Oral Daily   divalproex   500 mg Oral q1800   escitalopram   20 mg Oral Daily   furosemide   40 mg Intravenous BID   lipase/protease/amylase  72,000 Units Oral TID AC   nicotine   7 mg Transdermal  Daily   pantoprazole   40 mg Oral Daily   rosuvastatin   20 mg Oral q1800   sodium chloride  flush  3 mL Intravenous Q12H   Continuous Infusions:  sodium chloride      amiodarone  30 mg/hr (02/07/24 0319)     LOS: 1 day    Time spent: 55 minutes    Abigail Rylee D Mason Sole, DO [facility]  If 7PM-7AM, please contact night-coverage [email or web address] 02/07/2024, 10:44 AM

## 2024-02-07 NOTE — TOC Progression Note (Signed)
 Transition of Care [facility]) - Progression Note    Patient Details  Name: Abigail Wagner MRN: [id number] Date of Birth: 1951-04-23  Transition of Care [facility]) CM/SW Contact  Grandville Lax, [place] Phone Number: 02/07/2024, 2:56 PM  Clinical Narrative:    CSW updated by Oakley Bellman with palliative that pt and family would like for pt to return home with home hospice with [facility]. CSW spoke to [place] with [facility] to make referral, she will follow up with pts spouse. TOC to follow.   Expected Discharge Plan: Home w Hospice Care Barriers to Discharge: Continued Medical Work up (Pt has opted for 'treat the treatable'. Medical team working to stabilize heart rate.)  Expected Discharge Plan and Services In-house Referral: Clinical Social Work, Hospice / Palliative Care Discharge Planning Services: CM Consult Post Acute Care Choice: Hospice Living arrangements for the past 2 months: Single Family Home                                       Social Determinants of Health (SDOH) Interventions SDOH Screenings   Food Insecurity: No Food Insecurity (02/06/2024)  Housing: Low Risk  (02/06/2024)  Transportation Needs: No Transportation Needs (02/06/2024)  Utilities: Not At Risk (02/06/2024)  Alcohol Screen: Low Risk  (09/07/2023)  Depression (PHQ2-9): Medium Risk (01/01/2024)  Financial Resource Strain: Low Risk  (09/07/2023)  Physical Activity: Inactive (09/07/2023)  Social Connections: Moderately Integrated (02/06/2024)  Stress: Stress Concern Present (09/07/2023)  Tobacco Use: Medium Risk (02/06/2024)    Readmission Risk Interventions    02/07/2024    2:56 PM 02/06/2024    8:18 PM 06/30/2021    9:52 AM  Readmission Risk Prevention Plan  Transportation Screening Complete Complete   PCP or Specialist Appt within 5-7 Days  Complete   Home Care Screening Complete Complete Patient refused  Medication Review (RN CM) Complete Complete

## 2024-02-07 NOTE — Progress Notes (Signed)
 Palliative:  Abigail Wagner is lying quietly in bed.  She appears chronically ill and somewhat frail, pale.  She greets me, making and mostly keeping eye contact.  She is alert and oriented, able to make her needs known.  She does have periods of confusion, therefore she agrees that we should speak with her husband of 33 years, Abigail Wagner.  There is no family at bedside at this time.  Bedside nursing staff is present attending to needs.  Abigail Wagner and I briefly talked about her health concerns, and I ask if she has any questions.  She tells me she does not, but endorses she is not sure what to ask.  I ask if she is feeling better, and she states that she is not really feeling better.  I encouraged her to work closely with nursing staff for her needs.  Call to husband of 33 years, Abigail Wagner.  We talked about Abigail Wagner's acute health concerns and the treatment plan.  Abigail Wagner states that he was present this morning for cardiology visit and understands the plan.  We talked about the disposition plan for home with hospice.  Abigail Wagner states that he is working with [employer] daughter and feels that she is "75%" in agreement.  We talked about provider choice, they would choose [facility].  I shared that we will put this in place before they leave.  Conference with attending, bedside nursing staff, transition of care team related to patient condition, needs, goals of care, disposition.  Plan: Continue to treat the treatable, but no CPR or intubation.  Time for outcomes.  Home with [facility] hospice services when stable.  50 minutes  Arla Lab, NP Palliative Medicine Team  Team Phone [phone]

## 2024-02-07 NOTE — Progress Notes (Addendum)
 Rounding Note    Patient Name: Abigail Wagner Date of Encounter: 02/07/2024  [facility] Cardiologist: Teddie Favre, MD   Subjective   No complaints  Inpatient Medications    Scheduled Meds:  apixaban   5 mg Oral BID   Chlorhexidine  Gluconate Cloth  6 each Topical Q0600   colestipol   2 g Oral Daily   divalproex   500 mg Oral q1800   escitalopram   20 mg Oral Daily   furosemide   20 mg Intravenous BID   lipase/protease/amylase  72,000 Units Oral TID AC   nicotine   7 mg Transdermal Daily   pantoprazole   40 mg Oral Daily   rosuvastatin   20 mg Oral q1800   sodium chloride  flush  3 mL Intravenous Q12H   Continuous Infusions:  sodium chloride      amiodarone  30 mg/hr (02/07/24 0319)   PRN Meds: sodium chloride , acetaminophen , LORazepam , ondansetron  (ZOFRAN ) IV, oxyCODONE -acetaminophen , sodium chloride  flush   Vital Signs    Vitals:   02/07/24 0500 02/07/24 0600 02/07/24 0700 02/07/24 0840  BP: 115/72 (!) 138/99 138/77   Pulse:  (!) 117 (!) 50   Resp: 14 17 (!) 22   Temp:    (!) 97.4 F (36.3 C)  TempSrc:    Axillary  SpO2:  100% 97%   Weight: 60.1 kg     Height:        Intake/Output Summary (Last 24 hours) at 02/07/2024 0908 Last data filed at 02/07/2024 0319 Gross per 24 hour  Intake 446.93 ml  Output 900 ml  Net -453.07 ml      02/07/2024    5:00 AM 02/06/2024   11:02 AM 01/29/2024    9:45 AM  Last 3 Weights  Weight (lbs) 132 lb 7.9 oz 131 lb 13.4 oz 121 lb  Weight (kg) 60.1 kg 59.8 kg 54.885 kg      Telemetry    Afib low 100s - Personally Reviewed  ECG    N/a - Personally Reviewed  Physical Exam   GEN: No acute distress.   Neck: +JVD Cardiac: irreg Respiratory: crackles bilateral bases GI: Soft, nontender, non-distended  MS: 2+ bilateral LE edema Neuro:  Nonfocal  Psych: Normal affect   Labs    High Sensitivity Troponin:   Recent Labs  Lab 02/06/24 0837 02/06/24 1049  TROPONINIHS 21* 16     Chemistry Recent Labs  Lab  02/06/24 0837 02/06/24 0845 02/06/24 1049 02/07/24 0445  NA 141 142  --  138  K 3.9 4.0  --  4.1  CL 110 110  --  103  CO2 22  --   --  25  GLUCOSE 84 82  --  82  BUN 11 12  --  11  CREATININE 0.91 0.90  --  0.79  CALCIUM  8.5*  --   --  8.4*  MG  --   --  2.0 2.0  PROT 6.3*  --   --   --   ALBUMIN 2.9*  --   --   --   AST 22  --   --   --   ALT 15  --   --   --   ALKPHOS 46  --   --   --   BILITOT 0.4  --   --   --   GFRNONAA >60  --   --  >60  ANIONGAP 9  --   --  10    Lipids No results for input(s): "CHOL", "TRIG", "HDL", "LABVLDL", "  LDLCALC", "CHOLHDL" in the last 168 hours.  Hematology Recent Labs  Lab 02/06/24 0837 02/06/24 0845  WBC 6.2  --   RBC 2.98*  --   HGB 9.6* 10.5*  HCT 29.9* 31.0*  MCV 100.3*  --   MCH 32.2  --   MCHC 32.1  --   RDW 18.8*  --   PLT 394  --    Thyroid   Recent Labs  Lab 02/07/24 0445  TSH 6.385*    BNP Recent Labs  Lab 02/06/24 0837  BNP 1,408.0*    DDimer No results for input(s): "DDIMER" in the last 168 hours.   Radiology    DG Chest Port 1 View Result Date: 02/06/2024 CLINICAL DATA:  Bilateral lower extremity swelling, palpitations. EXAM: PORTABLE CHEST 1 VIEW COMPARISON:  January 02, 2024. FINDINGS: Stable cardiomediastinal silhouette. Right internal jugular Port-A-Cath is unchanged. Probable bibasilar pulmonary edema or pneumonia is noted with small left pleural effusion. Bony thorax is unremarkable. IMPRESSION: Probable bibasilar edema or pneumonia is noted with small left pleural effusion. Electronically Signed   By: Rosalene Colon M.D.   On: 02/06/2024 08:40    Cardiac Studies    Patient Profile     Abigail Wagner is a 73 y.o. female with a hx of paroxysmal Afib (on Eliquis  and Amio), moderate Left carotid artery stenosis (US  06/2023), HLD, HTN,  previous treated anal cancer, recurrent ovarian cancer (followed by Dr. Cheree Cords), depression who is being seen 02/06/2024 for the evaluation of HF and afib with rvr at the  request of Dr. Randal Bury.   Assessment & Plan    1.PAF - on amiodarone  200mg  daily at home - SR as recent as 01/01/24 EKG. Presents to ER in afib with RVR - started IV amio to reload and hopefully convert her back to SR - telemetry shows afib low 100s. Rebolus amio 150mg , continue drip.  - continue eliquis  for stroke prevention.  - may consider DCCV once more euvolemic if does not convert on IV amio, will make npo tonight just in case.    2.Acute HF, unknown type - BNP 1408, CXR probable edema - presented with leg edema - perhaps exacerbated by her afib with RVR  - lasix  naive, started IV lasix  20mg  bid. Early I/Os data thus far, negative . Downtrend in Cr with diuresis consistent with venous congestion and HF. Bed weights appear inaccurate, will ask for daily standing weights.  - remains significantly fluid overloaded, increase IV lasix  to 40mg  bid.  - echo is pending.       3. Ovarian cancer - followed by oncology - Diagnosed in 2015, status post debulking surgery followed by 6 cycles of carboplatin and Taxol completed in March 2016.  -recent concerns for recurrence.  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 - from onc note plans to repeat CT scan as outpatient.  - seen by palliative this admission  Addendum 2pm: followed by palliative, with recurrent cancer patient plans for home hospice care. We will work to control afib with medications, no plans for cardioversion at this time.     For questions or updates, please contact [facility] Please consult [email or web address] for contact info under        Signed, Armida Lander, MD  02/07/2024, 9:08 AM

## 2024-02-07 NOTE — Progress Notes (Signed)
*  PRELIMINARY RESULTS* Echocardiogram 2D Echocardiogram has been performed.  Bernis Brisker 02/07/2024, 4:15 PM

## 2024-02-07 NOTE — Plan of Care (Signed)
  Problem: Clinical Measurements: Goal: Cardiovascular complication will be avoided Outcome: Not Progressing   Problem: Coping: Goal: Level of anxiety will decrease Outcome: Not Progressing   

## 2024-02-08 DIAGNOSIS — I4891 Unspecified atrial fibrillation: Secondary | ICD-10-CM | POA: Diagnosis not present

## 2024-02-08 LAB — BASIC METABOLIC PANEL WITH GFR
Anion gap: 12 (ref 5–15)
BUN: 13 mg/dL (ref 8–23)
CO2: 25 mmol/L (ref 22–32)
Calcium: 8.7 mg/dL — ABNORMAL LOW (ref 8.9–10.3)
Chloride: 103 mmol/L (ref 98–111)
Creatinine, Ser: 0.88 mg/dL (ref 0.44–1.00)
GFR, Estimated: >60 mL/min (ref >60)
Glucose, Bld: 104 mg/dL — ABNORMAL HIGH (ref 70–99)
Potassium: 4 mmol/L (ref 3.5–5.1)
Sodium: 140 mmol/L (ref 135–145)

## 2024-02-08 LAB — T4, FREE: Free T4: 0.98 ng/dL (ref 0.61–1.12)

## 2024-02-08 LAB — MAGNESIUM: Magnesium: 2 mg/dL (ref 1.7–2.4)

## 2024-02-08 MED ORDER — AMIODARONE LOAD VIA INFUSION
150.0000 mg | Freq: Once | INTRAVENOUS | Status: AC
  Administered 2024-02-08: 150 mg via INTRAVENOUS
  Filled 2024-02-08: qty 83.34

## 2024-02-08 MED ORDER — METOPROLOL TARTRATE 25 MG PO TABS
25.0000 mg | ORAL_TABLET | Freq: Four times a day (QID) | ORAL | Status: DC
Start: 2024-02-08 — End: 2024-02-11
  Administered 2024-02-08 – 2024-02-10 (×12): 25 mg via ORAL
  Filled 2024-02-08 (×12): qty 1

## 2024-02-08 MED ORDER — LABETALOL HCL 5 MG/ML IV SOLN
5.0000 mg | Freq: Four times a day (QID) | INTRAVENOUS | Status: DC | PRN
  Administered 2024-02-08 (×2): 5 mg via INTRAVENOUS
  Filled 2024-02-08 (×2): qty 4

## 2024-02-08 NOTE — Progress Notes (Addendum)
 AFIB with RVR, heart rate 140's , spiking around, states " I dont feel well" . Currently on amio drip at 30mg /hr. BP ranging from 150's to 171 systolic. Hard to get a reliable BP due to constant moving of extremity. Contacted Dr Elyse Hand and informed .  Spoke to Dr Teofilo Fellers (Cards) : who will review chart. Currently Heart rate 1 teens , patient asleep. Per Dr. Teofilo Fellers, since pt is sleeping and heart rate under 120's, keep monitoring.

## 2024-02-08 NOTE — Plan of Care (Signed)

## 2024-02-08 NOTE — Plan of Care (Signed)

## 2024-02-08 NOTE — Progress Notes (Signed)
 Rounding Note    Patient Name: Abigail Wagner Date of Encounter: 02/08/2024  [facility] Cardiologist: Teddie Favre, MD   Subjective   No complaints  Inpatient Medications    Scheduled Meds:  apixaban   5 mg Oral BID   Chlorhexidine  Gluconate Cloth  6 each Topical Q0600   colestipol   2 g Oral Daily   divalproex   500 mg Oral q1800   escitalopram   20 mg Oral Daily   furosemide   40 mg Intravenous BID   lipase/protease/amylase  72,000 Units Oral TID AC   nicotine   7 mg Transdermal Daily   pantoprazole   40 mg Oral Daily   rosuvastatin   20 mg Oral q1800   sodium chloride  flush  3 mL Intravenous Q12H   Continuous Infusions:  amiodarone  30 mg/hr (02/08/24 0342)   PRN Meds: acetaminophen , labetalol, LORazepam , ondansetron  (ZOFRAN ) IV, oxyCODONE -acetaminophen , sodium chloride  flush   Vital Signs    Vitals:   02/08/24 0500 02/08/24 0515 02/08/24 0529 02/08/24 0800  BP: (!) 175/113 (!) 149/80 (!) 163/78   Pulse: (!) 131 97 98   Resp: (!) 22 18 15    Temp:    97.9 F (36.6 C)  TempSrc:    Oral  SpO2: 92% 99% 93%   Weight:      Height:        Intake/Output Summary (Last 24 hours) at 02/08/2024 0841 Last data filed at 02/08/2024 0800 Gross per 24 hour  Intake 968.78 ml  Output 1650 ml  Net -681.22 ml      02/08/2024    4:30 AM 02/07/2024    5:00 AM 02/06/2024   11:02 AM  Last 3 Weights  Weight (lbs) 122 lb 9.2 oz 132 lb 7.9 oz 131 lb 13.4 oz  Weight (kg) 55.6 kg 60.1 kg 59.8 kg      Telemetry    Afiubs 90s to 110s - Personally Reviewed  ECG    N/a - Personally Reviewed  Physical Exam   GEN: No acute distress.   Neck: +JVD Cardiac: RRR, no murmurs, rubs, or gallops.  Respiratory: crackles bilaterally GI: Soft, nontender, non-distended  MS: 1+ bilateral LE edema Neuro:  Nonfocal  Psych: Normal affect   Labs    High Sensitivity Troponin:   Recent Labs  Lab 02/06/24 0837 02/06/24 1049  TROPONINIHS 21* 16     Chemistry Recent Labs   Lab 02/06/24 0837 02/06/24 0845 02/06/24 1049 02/07/24 0445 02/08/24 0453  NA 141 142  --  138 140  K 3.9 4.0  --  4.1 4.0  CL 110 110  --  103 103  CO2 22  --   --  25 25  GLUCOSE 84 82  --  82 104*  BUN 11 12  --  11 13  CREATININE 0.91 0.90  --  0.79 0.88  CALCIUM  8.5*  --   --  8.4* 8.7*  MG  --   --  2.0 2.0 2.0  PROT 6.3*  --   --   --   --   ALBUMIN 2.9*  --   --   --   --   AST 22  --   --   --   --   ALT 15  --   --   --   --   ALKPHOS 46  --   --   --   --   BILITOT 0.4  --   --   --   --   GFRNONAA >60  --   --  >  60 >60  ANIONGAP 9  --   --  10 12    Lipids No results for input(s): "CHOL", "TRIG", "HDL", "LABVLDL", "LDLCALC", "CHOLHDL" in the last 168 hours.  Hematology Recent Labs  Lab 02/06/24 0837 02/06/24 0845  WBC 6.2  --   RBC 2.98*  --   HGB 9.6* 10.5*  HCT 29.9* 31.0*  MCV 100.3*  --   MCH 32.2  --   MCHC 32.1  --   RDW 18.8*  --   PLT 394  --    Thyroid   Recent Labs  Lab 02/07/24 0445  TSH 6.385*    BNP Recent Labs  Lab 02/06/24 0837  BNP 1,408.0*    DDimer No results for input(s): "DDIMER" in the last 168 hours.   Radiology    ECHOCARDIOGRAM COMPLETE Result Date: 02/07/2024    ECHOCARDIOGRAM REPORT   Patient Name:   LISABELLA PAO Stidham Date of Exam: 02/07/2024 Medical Rec #:  [id number]    Height:       66.0 in Accession #:    [id number]   Weight:       132.5 lb Date of Birth:  October 01, 1950     BSA:          1.679 m Patient Age:    73 years     BP:           144/87 mmHg Patient Gender: F            HR:           109 bpm. Exam Location:  [facility] Procedure: 2D Echo, Cardiac Doppler and Color Doppler (Both Spectral and Color            Flow Doppler were utilized during procedure). Indications:    Congestive Heart Failure I50.9  History:        Patient has prior history of Echocardiogram examinations, most                 recent 06/14/2021. Arrythmias:Atrial Fibrillation; Risk                 Factors:Hypertension, Dyslipidemia and Cigarette smoker.  History                 of ovarian and Anal carcinoma.  Sonographer:    Denese Finn RCS Referring Phys: [id number] PRATIK D [facility] IMPRESSIONS  1. Left ventricular ejection fraction, by estimation, is 50%. The left ventricle has low normal function. The left ventricle has no regional wall motion abnormalities. There is mild left ventricular hypertrophy. Left ventricular diastolic parameters are  indeterminate.  2. Right ventricular systolic function is low normal. The right ventricular size is mildly enlarged. There is moderately elevated pulmonary artery systolic pressure.  3. Left atrial size was severely dilated.  4. Right atrial size was mildly dilated.  5. Moderate pleural effusion in the left lateral region.  6. The mitral valve is abnormal. Moderate mitral valve regurgitation. No evidence of mitral stenosis.  7. The tricuspid valve is abnormal. Tricuspid valve regurgitation is mild to moderate.  8. Continous wave Doppler of the aortic valve is incomplete. Grossly there is no significant aortic stenosis. . The aortic valve was not well visualized. There is mild calcification of the aortic valve. There is mild thickening of the aortic valve. Aortic valve regurgitation is mild.  9. The inferior vena cava is dilated in size with <50% respiratory variability, suggesting right atrial pressure of 15 mmHg. FINDINGS  Left Ventricle: Left ventricular ejection  fraction, by estimation, is 50%. The left ventricle has low normal function. The left ventricle has no regional wall motion abnormalities. The left ventricular internal cavity size was normal in size. There is mild left ventricular hypertrophy. Left ventricular diastolic parameters are indeterminate. Right Ventricle: The right ventricular size is mildly enlarged. Right vetricular wall thickness was not well visualized. Right ventricular systolic function is low normal. There is moderately elevated pulmonary artery systolic pressure. The tricuspid regurgitant velocity  is 2.88 m/s, and with an assumed right atrial pressure of 15 mmHg, the estimated right ventricular systolic pressure is 48.2 mmHg. Left Atrium: Left atrial size was severely dilated. Right Atrium: Right atrial size was mildly dilated. Pericardium: There is no evidence of pericardial effusion. Mitral Valve: The mitral valve is abnormal. There is mild thickening of the mitral valve leaflet(s). There is mild calcification of the mitral valve leaflet(s). Mild mitral annular calcification. Moderate mitral valve regurgitation. No evidence of mitral  valve stenosis. Tricuspid Valve: The tricuspid valve is abnormal. Tricuspid valve regurgitation is mild to moderate. No evidence of tricuspid stenosis. Aortic Valve: Continous wave Doppler of the aortic valve is incomplete. Grossly there is no significant aortic stenosis. The aortic valve was not well visualized. There is mild calcification of the aortic valve. There is mild thickening of the aortic valve. There is mild aortic valve annular calcification. Aortic valve regurgitation is mild. Aortic regurgitation PHT measures 561 msec. Pulmonic Valve: The pulmonic valve was not well visualized. Pulmonic valve regurgitation is not visualized. No evidence of pulmonic stenosis. Aorta: The aortic root is normal in size and structure. Venous: The inferior vena cava is dilated in size with less than 50% respiratory variability, suggesting right atrial pressure of 15 mmHg. IAS/Shunts: No atrial level shunt detected by color flow Doppler. Additional Comments: There is a moderate pleural effusion in the left lateral region.  LEFT VENTRICLE PLAX 2D LVIDd:         4.00 cm LVIDs:         2.95 cm LV PW:         1.25 cm LV IVS:        1.10 cm LVOT diam:     1.70 cm LV SV:         31 LV SV Index:   18 LVOT Area:     2.27 cm  LV Volumes (MOD) LV vol d, MOD A2C: 50.9 ml LV vol d, MOD A4C: 51.7 ml LV vol s, MOD A2C: 28.3 ml LV vol s, MOD A4C: 27.6 ml LV SV MOD A2C:     22.6 ml LV SV MOD A4C:      51.7 ml LV SV MOD BP:      23.8 ml RIGHT VENTRICLE TAPSE (M-mode): 1.1 cm LEFT ATRIUM              Index        RIGHT ATRIUM           Index LA diam:        4.65 cm  2.77 cm/m   RA Area:     19.90 cm LA Vol (A2C):   104.0 ml 61.95 ml/m  RA Volume:   57.40 ml  34.19 ml/m LA Vol (A4C):   110.0 ml 65.52 ml/m LA Biplane Vol: 108.0 ml 64.33 ml/m  AORTIC VALVE LVOT Vmax:   73.35 cm/s LVOT Vmean:  47.750 cm/s LVOT VTI:    0.136 m AI PHT:      561 msec  AORTA Ao  Root diam: 3.20 cm MITRAL VALVE                  TRICUSPID VALVE MV Area (PHT): 5.97 cm       TR Peak grad:   33.2 mmHg MV Decel Time: 127 msec       TR Vmax:        288.00 cm/s MR Peak grad:    101.6 mmHg MR Mean grad:    65.0 mmHg    SHUNTS MR Vmax:         504.00 cm/s  Systemic VTI:  0.14 m MR Vmean:        376.0 cm/s   Systemic Diam: 1.70 cm MR PISA:         2.26 cm MR PISA Eff ROA: 9 mm MR PISA Radius:  0.60 cm MV E velocity: 133.00 cm/s Armida Lander MD Electronically signed by Armida Lander MD Signature Date/Time: 02/07/2024/4:36:04 PM    Final     Cardiac Studies    Patient Profile     MAKENLEE ASCHEMAN is a 73 y.o. female with a hx of paroxysmal Afib (on Eliquis  and Amio), moderate Left carotid artery stenosis (US  06/2023), HLD, HTN, previous treated anal cancer, recurrent ovarian cancer (followed by Dr. Cheree Cords), depression who is being seen 02/06/2024 for the evaluation of HF and afib with rvr at the request of Dr. Randal Bury.   Assessment & Plan    1.PAF - on amiodarone  200mg  daily at home - SR as recent as 01/01/24 EKG. Presents to ER in afib with RVR - started IV amio to reload and hopefully convert her back to SR - tele shows remains in afib 90s to low 100s. Rebolus 150mg  and continue drip - initially considered cardioversion. She has recurrent cancer and has elected for DNR status and  home hospice and thus would not pursue aggressive procedures.  - from review new diagnosis of afib with RVR 06/2021. Was hypotensive at the time  and thus amiodarone  was started, has been on since - will continue amio. Add lopressor 25mg  every 6 hours for additional rate control. Can titrate as needed over the weekend. Continue amio gtt over the weekend.  - continue eliquis  for stroke prevention.     2.Acute HFrEF - 01/2024 echo: LVE 50%, indet diastolic, severe LAE, mod MR. Dildated fixed IVC - BNP 1408, CXR probable edema - presented with leg edema - HFpEF likely exacerbated by her afib with RVR  - she is on IV lasix  40mg  bid. I/Os incomplete. Daily bed weights appear inaccurate, we will again ask for daily standing weights. Renal function is stable. Just yesterday pleural effusion on echo, dilated fixed IVC.  - remains fluid overloaded, continue IV lasix , would anticipate will be here throughout the weekend for ongoing diuresis.           3. Ovarian cancer - followed by oncology - Diagnosed in 2015, status post debulking surgery followed by 6 cycles of carboplatin and Taxol completed in March 2016.  -recent concerns for recurrence.  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 - from onc note plans to repeat CT scan as outpatient.  - seen by palliative this admission. From there note has elected DNR, home hospice at discharge.          For questions or updates, please contact [facility] Please consult [email or web address] for contact info under  Gael Jolly, MD  02/08/2024, 8:41 AM

## 2024-02-08 NOTE — Progress Notes (Addendum)
 Persistent hypertension. Systolic 180's. Readings filed under trend. Given 5 of oxy and tylenol . Informed MD. discussed with Dr Elyse Hand, no change to plan of care

## 2024-02-08 NOTE — Care Management Important Message (Signed)
 Important Message  Patient Details  Name: Abigail Wagner MRN: [id number] Date of Birth: 12/22/1950   Important Message Given:  Yes - Medicare IM     Nathanial Arrighi L Ambria Mayfield 02/08/2024, 3:39 PM

## 2024-02-08 NOTE — Progress Notes (Signed)
 Heart Failure Navigator Progress Note  Assessed for Heart & Vascular TOC clinic readiness.  Per recent Hospitalist note patient desires hospice care at home soon.  Consult was placed for palliative care.Navigator will continue to follow this patient at this time.  Celedonio Coil, RN, BSN [facility] Heart Failure Navigator Secure Chat Only

## 2024-02-08 NOTE — Progress Notes (Signed)
 PROGRESS NOTE    Abigail Wagner  WGN:[id number] DOB: 1951/04/27 DOA: 02/06/2024 PCP: Austine Lefort, MD   Brief Narrative:    Abigail Wagner is a 73 y.o. female with medical history significant for paroxysmal atrial fibrillation on Eliquis , dyslipidemia, hypertension, previously treated anal cancer, recurrent ovarian cancer, chronic diastolic heart failure, carotid artery stenosis, and depression who presented to the ED with complaints of worsening lower extremity edema as well as depression and overwhelm.  Patient was admitted with symptomatic atrial fibrillation with RVR as well as acute heart failure symptoms.  She is being aggressively diuresed and heart rate control being managed by cardiology with use of IV amiodarone  infusion.  Assessment & Plan:   Principal Problem:   Atrial fibrillation with RVR (HCC) Active Problems:   Depression   History of ovarian cancer   Anal cancer (HCC)   Essential hypertension   Anxiety   Abdominal carcinomatosis (HCC)  Assessment and Plan:   Symptomatic atrial fibrillation with RVR - Started on IV amiodarone  for heart rate control, monitor on telemetry, rebolus today -Started on metoprolol per cardiology 5/16 - Appreciate cardiology evaluation - TSH 2.049, on repeat noted to be elevated over 6 and free T3 and T4 pending   Acute diastolic CHF exacerbation secondary to above - Prior 2D echocardiogram 05/2021 with LVEF 60-65% and grade 2 diastolic dysfunction - Noted to have significant BNP elevation and plan will be for diuresis with now Lasix  40 mg IV twice daily -Follow strict I's and O's and daily weights - Echo with LVEF 50%   Dyslipidemia/carotid artery stenosis - Continue Crestor    Hypertension - Does not appear to be on any home antihypertensives - Monitor closely with diuresis -Noted to have significant elevations in blood pressures overnight, will add IV hydralazine  as needed   Recurrent ovarian cancer now with concern for  peritoneal carcinomatosis - With prior history of anal cancer - Desires hospice care at home soon - Appreciate discussions with palliative care   Depression/anxiety - Continue home medications -Continue sitter at bedside   GERD - Continue PPI    DVT prophylaxis:apixaban  Code Status: DNR Family Communication: Spouse at bedside 5/16 Disposition Plan:  Status is: Inpatient Remains inpatient appropriate because: Need for IV medications.   Consultants:  Cardiology Palliative care  Procedures:  None  Antimicrobials:  None   Subjective: Patient seen and evaluated today with improving heart rates on amiodarone  infusion.  Cardiology plans to rebolus and continue drip for now and has added Lopressor.  Noted to have significant blood pressure elevations overnight.  Objective: Vitals:   02/08/24 1130 02/08/24 1145 02/08/24 1200 02/08/24 1209  BP:    (!) 147/75  Pulse: 97  (!) 101 94  Resp: (!) 21 (!) 21 13 (!) 22  Temp:   98.4 F (36.9 C)   TempSrc:   Oral   SpO2: 94% 94%    Weight:      Height:        Intake/Output Summary (Last 24 hours) at 02/08/2024 1325 Last data filed at 02/08/2024 1223 Gross per 24 hour  Intake 1088.78 ml  Output 2150 ml  Net -1061.22 ml   Filed Weights   02/08/24 0430 02/08/24 0800 02/08/24 0915  Weight: 55.6 kg 56.2 kg 56.2 kg    Examination:  General exam: Appears calm and comfortable  Respiratory system: Clear to auscultation. Respiratory effort normal. Cardiovascular system: S1 & S2 heard, irregular tachycardic Gastrointestinal system: Abdomen is soft Central nervous system: Alert and awake  Extremities: Persistent 2+ bilateral edema to lower extremities Skin: No significant lesions noted Psychiatry: Flat affect.    Data Reviewed: I have personally reviewed following labs and imaging studies  CBC: Recent Labs  Lab 02/06/24 0837 02/06/24 0845  WBC 6.2  --   NEUTROABS 5.2  --   HGB 9.6* 10.5*  HCT 29.9* 31.0*  MCV 100.3*   --   PLT 394  --    Basic Metabolic Panel: Recent Labs  Lab 02/06/24 0837 02/06/24 0845 02/06/24 1049 02/07/24 0445 02/08/24 0453  NA 141 142  --  138 140  K 3.9 4.0  --  4.1 4.0  CL 110 110  --  103 103  CO2 22  --   --  25 25  GLUCOSE 84 82  --  82 104*  BUN 11 12  --  11 13  CREATININE 0.91 0.90  --  0.79 0.88  CALCIUM  8.5*  --   --  8.4* 8.7*  MG  --   --  2.0 2.0 2.0   GFR: Estimated Creatinine Clearance: 50.5 mL/min (by C-G formula based on SCr of 0.88 mg/dL). Liver Function Tests: Recent Labs  Lab 02/06/24 0837  AST 22  ALT 15  ALKPHOS 46  BILITOT 0.4  PROT 6.3*  ALBUMIN 2.9*   No results for input(s): "LIPASE", "AMYLASE" in the last 168 hours. No results for input(s): "AMMONIA" in the last 168 hours. Coagulation Profile: No results for input(s): "INR", "PROTIME" in the last 168 hours. Cardiac Enzymes: No results for input(s): "CKTOTAL", "CKMB", "CKMBINDEX", "TROPONINI" in the last 168 hours. BNP (last 3 results) No results for input(s): "PROBNP" in the last 8760 hours. HbA1C: No results for input(s): "HGBA1C" in the last 72 hours. CBG: No results for input(s): "GLUCAP" in the last 168 hours. Lipid Profile: No results for input(s): "CHOL", "HDL", "LDLCALC", "TRIG", "CHOLHDL", "LDLDIRECT" in the last 72 hours. Thyroid  Function Tests: Recent Labs    02/07/24 0445 02/08/24 0453  TSH 6.385*  --   FREET4  --  0.98   Anemia Panel: No results for input(s): "VITAMINB12", "FOLATE", "FERRITIN", "TIBC", "IRON ", "RETICCTPCT" in the last 72 hours. Sepsis Labs: No results for input(s): "PROCALCITON", "LATICACIDVEN" in the last 168 hours.  Recent Results (from the past 240 hours)  Culture, blood (routine x 2)     Status: None (Preliminary result)   Collection Time: 02/06/24  9:38 AM   Specimen: BLOOD  Result Value Ref Range Status   Specimen Description BLOOD BLOOD RIGHT ARM  Final   Special Requests   Final    Blood Culture adequate volume BOTTLES DRAWN  AEROBIC AND ANAEROBIC   Culture   Final    NO GROWTH < 24 HOURS Performed at [facility], [address], [place], [place] [address]    Report Status PENDING  Incomplete  Culture, blood (routine x 2)     Status: None (Preliminary result)   Collection Time: 02/06/24  9:43 AM   Specimen: BLOOD  Result Value Ref Range Status   Specimen Description BLOOD BLOOD LEFT ARM  Final   Special Requests   Final    BOTTLES DRAWN AEROBIC AND ANAEROBIC Blood Culture adequate volume   Culture   Final    NO GROWTH < 24 HOURS Performed at [facility], [address] [place], [place] [address]    Report Status PENDING  Incomplete  MRSA Next Gen by PCR, Nasal     Status: None   Collection Time: 02/06/24 10:28 AM  Specimen: Nasal Mucosa; Nasal Swab  Result Value Ref Range Status   MRSA by PCR Next Gen NOT DETECTED NOT DETECTED Final    Comment: (NOTE) The GeneXpert MRSA Assay (FDA approved for NASAL specimens only), is one component of a comprehensive MRSA colonization surveillance program. It is not intended to diagnose MRSA infection nor to guide or monitor treatment for MRSA infections. Test performance is not FDA approved in patients less than 16 years old. Performed at [facility], [address] [place], [place] [address]          Radiology Studies: ECHOCARDIOGRAM COMPLETE Result Date: 02/07/2024    ECHOCARDIOGRAM REPORT   Patient Name:   Abigail Wagner Spurrier Date of Exam: 02/07/2024 Medical Rec #:  [id number]    Height:       66.0 in Accession #:    [id number]   Weight:       132.5 lb Date of Birth:  [birth year]     BSA:          1.679 m Patient Age:    73 years     BP:           144/87 mmHg Patient Gender: F            HR:           109 bpm. Exam Location:  [facility] Procedure: 2D Echo, Cardiac Doppler and Color Doppler (Both Spectral and Color            Flow Doppler were utilized during procedure). Indications:    Congestive Heart Failure I50.9  History:        Patient has prior history of  Echocardiogram examinations, most                 recent 06/14/2021. Arrythmias:Atrial Fibrillation; Risk                 Factors:Hypertension, Dyslipidemia and Cigarette smoker. History                 of ovarian and Anal carcinoma.  Sonographer:    Denese Finn RCS Referring Phys: [id number] Cay Kath D [facility] IMPRESSIONS  1. Left ventricular ejection fraction, by estimation, is 50%. The left ventricle has low normal function. The left ventricle has no regional wall motion abnormalities. There is mild left ventricular hypertrophy. Left ventricular diastolic parameters are  indeterminate.  2. Right ventricular systolic function is low normal. The right ventricular size is mildly enlarged. There is moderately elevated pulmonary artery systolic pressure.  3. Left atrial size was severely dilated.  4. Right atrial size was mildly dilated.  5. Moderate pleural effusion in the left lateral region.  6. The mitral valve is abnormal. Moderate mitral valve regurgitation. No evidence of mitral stenosis.  7. The tricuspid valve is abnormal. Tricuspid valve regurgitation is mild to moderate.  8. Continous wave Doppler of the aortic valve is incomplete. Grossly there is no significant aortic stenosis. . The aortic valve was not well visualized. There is mild calcification of the aortic valve. There is mild thickening of the aortic valve. Aortic valve regurgitation is mild.  9. The inferior vena cava is dilated in size with <50% respiratory variability, suggesting right atrial pressure of 15 mmHg. FINDINGS  Left Ventricle: Left ventricular ejection fraction, by estimation, is 50%. The left ventricle has low normal function. The left ventricle has no regional wall motion abnormalities. The left ventricular internal cavity size was normal in size. There is mild left ventricular hypertrophy. Left  ventricular diastolic parameters are indeterminate. Right Ventricle: The right ventricular size is mildly enlarged. Right vetricular wall  thickness was not well visualized. Right ventricular systolic function is low normal. There is moderately elevated pulmonary artery systolic pressure. The tricuspid regurgitant velocity is 2.88 m/s, and with an assumed right atrial pressure of 15 mmHg, the estimated right ventricular systolic pressure is 48.2 mmHg. Left Atrium: Left atrial size was severely dilated. Right Atrium: Right atrial size was mildly dilated. Pericardium: There is no evidence of pericardial effusion. Mitral Valve: The mitral valve is abnormal. There is mild thickening of the mitral valve leaflet(s). There is mild calcification of the mitral valve leaflet(s). Mild mitral annular calcification. Moderate mitral valve regurgitation. No evidence of mitral  valve stenosis. Tricuspid Valve: The tricuspid valve is abnormal. Tricuspid valve regurgitation is mild to moderate. No evidence of tricuspid stenosis. Aortic Valve: Continous wave Doppler of the aortic valve is incomplete. Grossly there is no significant aortic stenosis. The aortic valve was not well visualized. There is mild calcification of the aortic valve. There is mild thickening of the aortic valve. There is mild aortic valve annular calcification. Aortic valve regurgitation is mild. Aortic regurgitation PHT measures 561 msec. Pulmonic Valve: The pulmonic valve was not well visualized. Pulmonic valve regurgitation is not visualized. No evidence of pulmonic stenosis. Aorta: The aortic root is normal in size and structure. Venous: The inferior vena cava is dilated in size with less than 50% respiratory variability, suggesting right atrial pressure of 15 mmHg. IAS/Shunts: No atrial level shunt detected by color flow Doppler. Additional Comments: There is a moderate pleural effusion in the left lateral region.  LEFT VENTRICLE PLAX 2D LVIDd:         4.00 cm LVIDs:         2.95 cm LV PW:         1.25 cm LV IVS:        1.10 cm LVOT diam:     1.70 cm LV SV:         31 LV SV Index:   18 LVOT  Area:     2.27 cm  LV Volumes (MOD) LV vol d, MOD A2C: 50.9 ml LV vol d, MOD A4C: 51.7 ml LV vol s, MOD A2C: 28.3 ml LV vol s, MOD A4C: 27.6 ml LV SV MOD A2C:     22.6 ml LV SV MOD A4C:     51.7 ml LV SV MOD BP:      23.8 ml RIGHT VENTRICLE TAPSE (M-mode): 1.1 cm LEFT ATRIUM              Index        RIGHT ATRIUM           Index LA diam:        4.65 cm  2.77 cm/m   RA Area:     19.90 cm LA Vol (A2C):   104.0 ml 61.95 ml/m  RA Volume:   57.40 ml  34.19 ml/m LA Vol (A4C):   110.0 ml 65.52 ml/m LA Biplane Vol: 108.0 ml 64.33 ml/m  AORTIC VALVE LVOT Vmax:   73.35 cm/s LVOT Vmean:  47.750 cm/s LVOT VTI:    0.136 m AI PHT:      561 msec  AORTA Ao Root diam: 3.20 cm MITRAL VALVE                  TRICUSPID VALVE MV Area (PHT): 5.97 cm       TR Peak  grad:   33.2 mmHg MV Decel Time: 127 msec       TR Vmax:        288.00 cm/s MR Peak grad:    101.6 mmHg MR Mean grad:    65.0 mmHg    SHUNTS MR Vmax:         504.00 cm/s  Systemic VTI:  0.14 m MR Vmean:        376.0 cm/s   Systemic Diam: 1.70 cm MR PISA:         2.26 cm MR PISA Eff ROA: 9 mm MR PISA Radius:  0.60 cm MV E velocity: 133.00 cm/s Armida Lander MD Electronically signed by Armida Lander MD Signature Date/Time: 02/07/2024/4:36:04 PM    Final         Scheduled Meds:  apixaban   5 mg Oral BID   Chlorhexidine  Gluconate Cloth  6 each Topical Q0600   colestipol   2 g Oral Daily   divalproex   500 mg Oral q1800   escitalopram   20 mg Oral Daily   furosemide   40 mg Intravenous BID   lipase/protease/amylase  72,000 Units Oral TID AC   metoprolol tartrate  25 mg Oral QID   nicotine   7 mg Transdermal Daily   pantoprazole   40 mg Oral Daily   rosuvastatin   20 mg Oral q1800   sodium chloride  flush  3 mL Intravenous Q12H   Continuous Infusions:  amiodarone  30 mg/hr (02/08/24 1212)     LOS: 2 days    Time spent: 55 minutes    Athens Lebeau Loran Rock, DO [facility]  If 7PM-7AM, please contact night-coverage [email or web address] 02/08/2024, 1:25 PM

## 2024-02-09 DIAGNOSIS — I4891 Unspecified atrial fibrillation: Secondary | ICD-10-CM | POA: Diagnosis not present

## 2024-02-09 LAB — BASIC METABOLIC PANEL WITH GFR
Anion gap: 10 (ref 5–15)
BUN: 11 mg/dL (ref 8–23)
CO2: 30 mmol/L (ref 22–32)
Calcium: 8.3 mg/dL — ABNORMAL LOW (ref 8.9–10.3)
Chloride: 96 mmol/L — ABNORMAL LOW (ref 98–111)
Creatinine, Ser: 0.7 mg/dL (ref 0.44–1.00)
GFR, Estimated: >60 mL/min (ref >60)
Glucose, Bld: 104 mg/dL — ABNORMAL HIGH (ref 70–99)
Potassium: 2.9 mmol/L — ABNORMAL LOW (ref 3.5–5.1)
Sodium: 136 mmol/L (ref 135–145)

## 2024-02-09 LAB — CBC
HCT: 28.7 % — ABNORMAL LOW (ref 36.0–46.0)
Hemoglobin: 8.9 g/dL — ABNORMAL LOW (ref 12.0–15.0)
MCH: 31 pg (ref 26.0–34.0)
MCHC: 31 g/dL (ref 30.0–36.0)
MCV: 100 fL (ref 80.0–100.0)
Platelets: 280 10*3/uL (ref 150–400)
RBC: 2.87 MIL/uL — ABNORMAL LOW (ref 3.87–5.11)
RDW: 18.1 % — ABNORMAL HIGH (ref 11.5–15.5)
WBC: 4.5 10*3/uL (ref 4.0–10.5)
nRBC: 0 % (ref 0.0–0.2)

## 2024-02-09 LAB — MAGNESIUM: Magnesium: 1.8 mg/dL (ref 1.7–2.4)

## 2024-02-09 LAB — T3: T3, Total: 91 ng/dL (ref 71–180)

## 2024-02-09 MED ORDER — POTASSIUM CHLORIDE CRYS ER 20 MEQ PO TBCR
40.0000 meq | EXTENDED_RELEASE_TABLET | Freq: Two times a day (BID) | ORAL | Status: AC
  Administered 2024-02-09 (×2): 40 meq via ORAL
  Filled 2024-02-09 (×2): qty 2

## 2024-02-09 MED ORDER — POTASSIUM CHLORIDE CRYS ER 20 MEQ PO TBCR
40.0000 meq | EXTENDED_RELEASE_TABLET | Freq: Once | ORAL | Status: AC
  Administered 2024-02-09: 40 meq via ORAL
  Filled 2024-02-09: qty 2

## 2024-02-09 MED ORDER — ENSURE ENLIVE PO LIQD
237.0000 mL | Freq: Two times a day (BID) | ORAL | Status: DC
  Administered 2024-02-10 – 2024-02-11 (×3): 237 mL via ORAL

## 2024-02-09 NOTE — Progress Notes (Signed)
 PROGRESS NOTE    Abigail Wagner  WUJ:[id number] DOB: [birth year] DOA: 02/06/2024 PCP: Austine Lefort, MD   Brief Narrative:    Abigail Wagner is a 73 y.o. female with medical history significant for paroxysmal atrial fibrillation on Eliquis , dyslipidemia, hypertension, previously treated anal cancer, recurrent ovarian cancer, chronic diastolic heart failure, carotid artery stenosis, and depression who presented to the ED with complaints of worsening lower extremity edema as well as depression and overwhelm.  Patient was admitted with symptomatic atrial fibrillation with RVR as well as acute heart failure symptoms.  She is being aggressively diuresed and heart rate control being managed by cardiology with use of IV amiodarone  infusion.  Assessment & Plan:   Principal Problem:   Atrial fibrillation with RVR (HCC) Active Problems:   Depression   History of ovarian cancer   Anal cancer (HCC)   Essential hypertension   Anxiety   Abdominal carcinomatosis (HCC)  Assessment and Plan:   Symptomatic atrial fibrillation with RVR - Started on IV amiodarone  for heart rate control, monitor on telemetry, rebolus today -Started on metoprolol  per cardiology 5/16 - Appreciate cardiology evaluation - TSH 2.049, on repeat noted to be elevated over 6 and free T3 and T4 within normal limits   Acute diastolic CHF exacerbation secondary to above - Prior 2D echocardiogram 05/2021 with LVEF 60-65% and grade 2 diastolic dysfunction - Noted to have significant BNP elevation and plan will be for diuresis with now Lasix  40 mg IV twice daily -Follow strict I's and O's and daily weights - Echo with LVEF 50%  Hypokalemia -Related to diuresis -Replete and continue to monitor   Dyslipidemia/carotid artery stenosis - Continue Crestor    Hypertension - Does not appear to be on any home antihypertensives - Monitor closely with diuresis -Noted to have significant elevations in blood pressures overnight, will add  IV hydralazine  as needed   Recurrent ovarian cancer now with concern for peritoneal carcinomatosis - With prior history of anal cancer - Desires hospice care at home soon - Appreciate discussions with palliative care   Depression/anxiety - Continue home medications -Continue sitter at bedside   GERD - Continue PPI    DVT prophylaxis:apixaban  Code Status: DNR Family Communication: Spouse at bedside 5/16 Disposition Plan:  Status is: Inpatient Remains inpatient appropriate because: Need for IV medications.   Consultants:  Cardiology Palliative care  Procedures:  None  Antimicrobials:  None   Subjective: Patient seen and evaluated today with i heart rates in the 90-100 bpm range on amiodarone  infusion.  She continues to diurese with improvement in lower extremity edema noted and has some mild hypokalemia this morning.  No complaints or concerns voiced.  She was noted to have over 2 L diuresis overnight.  Objective: Vitals:   02/09/24 0800 02/09/24 0816 02/09/24 0830 02/09/24 0900  BP:  (!) 155/96 131/70 115/72  Pulse: 97 96 (!) 103   Resp: (!) 21  19 (!) 28  Temp:      TempSrc:      SpO2: 99%  97% 91%  Weight: 55.8 kg     Height: 5\' 6"  (1.676 m)       Intake/Output Summary (Last 24 hours) at 02/09/2024 1034 Last data filed at 02/09/2024 0600 Gross per 24 hour  Intake 240 ml  Output 1650 ml  Net -1410 ml   Filed Weights   02/08/24 0915 02/09/24 0627 02/09/24 0800  Weight: 56.2 kg 55.8 kg 55.8 kg    Examination:  General exam: Appears calm  and comfortable  Respiratory system: Clear to auscultation. Respiratory effort normal. Cardiovascular system: S1 & S2 heard, irregular tachycardic Gastrointestinal system: Abdomen is soft Central nervous system: Alert and awake Extremities: Persistent 2+ bilateral edema to lower extremities Skin: No significant lesions noted Psychiatry: Flat affect.    Data Reviewed: I have personally reviewed following labs and  imaging studies  CBC: Recent Labs  Lab 02/06/24 0837 02/06/24 0845 02/09/24 0454  WBC 6.2  --  4.5  NEUTROABS 5.2  --   --   HGB 9.6* 10.5* 8.9*  HCT 29.9* 31.0* 28.7*  MCV 100.3*  --  100.0  PLT 394  --  280   Basic Metabolic Panel: Recent Labs  Lab 02/06/24 0837 02/06/24 0845 02/06/24 1049 02/07/24 0445 02/08/24 0453 02/09/24 0454  NA 141 142  --  138 140 136  K 3.9 4.0  --  4.1 4.0 2.9*  CL 110 110  --  103 103 96*  CO2 22  --   --  25 25 30   GLUCOSE 84 82  --  82 104* 104*  BUN 11 12  --  11 13 11   CREATININE 0.91 0.90  --  0.79 0.88 0.70  CALCIUM  8.5*  --   --  8.4* 8.7* 8.3*  MG  --   --  2.0 2.0 2.0 1.8   GFR: Estimated Creatinine Clearance: 55.2 mL/min (by C-G formula based on SCr of 0.7 mg/dL). Liver Function Tests: Recent Labs  Lab 02/06/24 0837  AST 22  ALT 15  ALKPHOS 46  BILITOT 0.4  PROT 6.3*  ALBUMIN 2.9*   No results for input(s): "LIPASE", "AMYLASE" in the last 168 hours. No results for input(s): "AMMONIA" in the last 168 hours. Coagulation Profile: No results for input(s): "INR", "PROTIME" in the last 168 hours. Cardiac Enzymes: No results for input(s): "CKTOTAL", "CKMB", "CKMBINDEX", "TROPONINI" in the last 168 hours. BNP (last 3 results) No results for input(s): "PROBNP" in the last 8760 hours. HbA1C: No results for input(s): "HGBA1C" in the last 72 hours. CBG: No results for input(s): "GLUCAP" in the last 168 hours. Lipid Profile: No results for input(s): "CHOL", "HDL", "LDLCALC", "TRIG", "CHOLHDL", "LDLDIRECT" in the last 72 hours. Thyroid  Function Tests: Recent Labs    02/07/24 0445 02/08/24 0453  TSH 6.385*  --   FREET4  --  0.98   Anemia Panel: No results for input(s): "VITAMINB12", "FOLATE", "FERRITIN", "TIBC", "IRON ", "RETICCTPCT" in the last 72 hours. Sepsis Labs: No results for input(s): "PROCALCITON", "LATICACIDVEN" in the last 168 hours.  Recent Results (from the past 240 hours)  Culture, blood (routine x 2)      Status: None (Preliminary result)   Collection Time: 02/06/24  9:38 AM   Specimen: BLOOD  Result Value Ref Range Status   Specimen Description BLOOD BLOOD RIGHT ARM  Final   Special Requests   Final    Blood Culture adequate volume BOTTLES DRAWN AEROBIC AND ANAEROBIC   Culture   Final    NO GROWTH 3 DAYS Performed at [facility], [address] [place], [place] [address]    Report Status PENDING  Incomplete  Culture, blood (routine x 2)     Status: None (Preliminary result)   Collection Time: 02/06/24  9:43 AM   Specimen: BLOOD  Result Value Ref Range Status   Specimen Description BLOOD BLOOD LEFT ARM  Final   Special Requests   Final    BOTTLES DRAWN AEROBIC AND ANAEROBIC Blood Culture adequate volume   Culture  Final    NO GROWTH 3 DAYS Performed at [facility], [address] [place], [place] [address]    Report Status PENDING  Incomplete  MRSA Next Gen by PCR, Nasal     Status: None   Collection Time: 02/06/24 10:28 AM   Specimen: Nasal Mucosa; Nasal Swab  Result Value Ref Range Status   MRSA by PCR Next Gen NOT DETECTED NOT DETECTED Final    Comment: (NOTE) The GeneXpert MRSA Assay (FDA approved for NASAL specimens only), is one component of a comprehensive MRSA colonization surveillance program. It is not intended to diagnose MRSA infection nor to guide or monitor treatment for MRSA infections. Test performance is not FDA approved in patients less than 64 years old. Performed at [facility], [address], [place], [place] [address]          Radiology Studies: ECHOCARDIOGRAM COMPLETE Result Date: 02/07/2024    ECHOCARDIOGRAM REPORT   Patient Name:   Abigail Wagner Date of Exam: 02/07/2024 Medical Rec #:  [id number]    Height:       66.0 in Accession #:    [id number]   Weight:       132.5 lb Date of Birth:  12/04/1950     BSA:          1.679 m Patient Age:    73 years     BP:           144/87 mmHg Patient Gender: F            HR:           109 bpm. Exam Location:   [facility] Procedure: 2D Echo, Cardiac Doppler and Color Doppler (Both Spectral and Color            Flow Doppler were utilized during procedure). Indications:    Congestive Heart Failure I50.9  History:        Patient has prior history of Echocardiogram examinations, most                 recent 06/14/2021. Arrythmias:Atrial Fibrillation; Risk                 Factors:Hypertension, Dyslipidemia and Cigarette smoker. History                 of ovarian and Anal carcinoma.  Sonographer:    Denese Finn RCS Referring Phys: [id number] Spencer Peterkin D [facility] IMPRESSIONS  1. Left ventricular ejection fraction, by estimation, is 50%. The left ventricle has low normal function. The left ventricle has no regional wall motion abnormalities. There is mild left ventricular hypertrophy. Left ventricular diastolic parameters are  indeterminate.  2. Right ventricular systolic function is low normal. The right ventricular size is mildly enlarged. There is moderately elevated pulmonary artery systolic pressure.  3. Left atrial size was severely dilated.  4. Right atrial size was mildly dilated.  5. Moderate pleural effusion in the left lateral region.  6. The mitral valve is abnormal. Moderate mitral valve regurgitation. No evidence of mitral stenosis.  7. The tricuspid valve is abnormal. Tricuspid valve regurgitation is mild to moderate.  8. Continous wave Doppler of the aortic valve is incomplete. Grossly there is no significant aortic stenosis. . The aortic valve was not well visualized. There is mild calcification of the aortic valve. There is mild thickening of the aortic valve. Aortic valve regurgitation is mild.  9. The inferior vena cava is dilated in size with <50% respiratory variability, suggesting right atrial  pressure of 15 mmHg. FINDINGS  Left Ventricle: Left ventricular ejection fraction, by estimation, is 50%. The left ventricle has low normal function. The left ventricle has no regional wall motion abnormalities. The left  ventricular internal cavity size was normal in size. There is mild left ventricular hypertrophy. Left ventricular diastolic parameters are indeterminate. Right Ventricle: The right ventricular size is mildly enlarged. Right vetricular wall thickness was not well visualized. Right ventricular systolic function is low normal. There is moderately elevated pulmonary artery systolic pressure. The tricuspid regurgitant velocity is 2.88 m/s, and with an assumed right atrial pressure of 15 mmHg, the estimated right ventricular systolic pressure is 48.2 mmHg. Left Atrium: Left atrial size was severely dilated. Right Atrium: Right atrial size was mildly dilated. Pericardium: There is no evidence of pericardial effusion. Mitral Valve: The mitral valve is abnormal. There is mild thickening of the mitral valve leaflet(s). There is mild calcification of the mitral valve leaflet(s). Mild mitral annular calcification. Moderate mitral valve regurgitation. No evidence of mitral  valve stenosis. Tricuspid Valve: The tricuspid valve is abnormal. Tricuspid valve regurgitation is mild to moderate. No evidence of tricuspid stenosis. Aortic Valve: Continous wave Doppler of the aortic valve is incomplete. Grossly there is no significant aortic stenosis. The aortic valve was not well visualized. There is mild calcification of the aortic valve. There is mild thickening of the aortic valve. There is mild aortic valve annular calcification. Aortic valve regurgitation is mild. Aortic regurgitation PHT measures 561 msec. Pulmonic Valve: The pulmonic valve was not well visualized. Pulmonic valve regurgitation is not visualized. No evidence of pulmonic stenosis. Aorta: The aortic root is normal in size and structure. Venous: The inferior vena cava is dilated in size with less than 50% respiratory variability, suggesting right atrial pressure of 15 mmHg. IAS/Shunts: No atrial level shunt detected by color flow Doppler. Additional Comments: There is  a moderate pleural effusion in the left lateral region.  LEFT VENTRICLE PLAX 2D LVIDd:         4.00 cm LVIDs:         2.95 cm LV PW:         1.25 cm LV IVS:        1.10 cm LVOT diam:     1.70 cm LV SV:         31 LV SV Index:   18 LVOT Area:     2.27 cm  LV Volumes (MOD) LV vol d, MOD A2C: 50.9 ml LV vol d, MOD A4C: 51.7 ml LV vol s, MOD A2C: 28.3 ml LV vol s, MOD A4C: 27.6 ml LV SV MOD A2C:     22.6 ml LV SV MOD A4C:     51.7 ml LV SV MOD BP:      23.8 ml RIGHT VENTRICLE TAPSE (M-mode): 1.1 cm LEFT ATRIUM              Index        RIGHT ATRIUM           Index LA diam:        4.65 cm  2.77 cm/m   RA Area:     19.90 cm LA Vol (A2C):   104.0 ml 61.95 ml/m  RA Volume:   57.40 ml  34.19 ml/m LA Vol (A4C):   110.0 ml 65.52 ml/m LA Biplane Vol: 108.0 ml 64.33 ml/m  AORTIC VALVE LVOT Vmax:   73.35 cm/s LVOT Vmean:  47.750 cm/s LVOT VTI:    0.136 m AI  PHT:      561 msec  AORTA Ao Root diam: 3.20 cm MITRAL VALVE                  TRICUSPID VALVE MV Area (PHT): 5.97 cm       TR Peak grad:   33.2 mmHg MV Decel Time: 127 msec       TR Vmax:        288.00 cm/s MR Peak grad:    101.6 mmHg MR Mean grad:    65.0 mmHg    SHUNTS MR Vmax:         504.00 cm/s  Systemic VTI:  0.14 m MR Vmean:        376.0 cm/s   Systemic Diam: 1.70 cm MR PISA:         2.26 cm MR PISA Eff ROA: 9 mm MR PISA Radius:  0.60 cm MV E velocity: 133.00 cm/s Armida Lander MD Electronically signed by Armida Lander MD Signature Date/Time: 02/07/2024/4:36:04 PM    Final         Scheduled Meds:  apixaban   5 mg Oral BID   Chlorhexidine  Gluconate Cloth  6 each Topical Q0600   colestipol   2 g Oral Daily   divalproex   500 mg Oral q1800   escitalopram   20 mg Oral Daily   furosemide   40 mg Intravenous BID   lipase/protease/amylase  72,000 Units Oral TID AC   metoprolol  tartrate  25 mg Oral QID   nicotine   7 mg Transdermal Daily   pantoprazole   40 mg Oral Daily   potassium chloride   40 mEq Oral BID   rosuvastatin   20 mg Oral q1800   sodium  chloride flush  3 mL Intravenous Q12H   Continuous Infusions:  amiodarone  30 mg/hr (02/08/24 2241)     LOS: 3 days    Time spent: 55 minutes    Lucrezia Dehne Loran Rock, DO [facility]  If 7PM-7AM, please contact night-coverage [email or web address] 02/09/2024, 10:34 AM

## 2024-02-09 NOTE — Plan of Care (Signed)

## 2024-02-09 NOTE — Plan of Care (Signed)
  Problem: Health Behavior/Discharge Planning: °Goal: Ability to manage health-related needs will improve °Outcome: Progressing °  °Problem: Clinical Measurements: °Goal: Ability to maintain clinical measurements within normal limits will improve °Outcome: Progressing °Goal: Will remain free from infection °Outcome: Progressing °Goal: Diagnostic test results will improve °Outcome: Progressing °Goal: Respiratory complications will improve °Outcome: Progressing °  °Problem: Activity: °Goal: Risk for activity intolerance will decrease °Outcome: Progressing °  °Problem: Nutrition: °Goal: Adequate nutrition will be maintained °Outcome: Progressing °  °Problem: Coping: °Goal: Level of anxiety will decrease °Outcome: Progressing °  °Problem: Elimination: °Goal: Will not experience complications related to bowel motility °Outcome: Progressing °Goal: Will not experience complications related to urinary retention °Outcome: Progressing °  °

## 2024-02-10 DIAGNOSIS — I4891 Unspecified atrial fibrillation: Secondary | ICD-10-CM | POA: Diagnosis not present

## 2024-02-10 LAB — BASIC METABOLIC PANEL WITH GFR
Anion gap: 8 (ref 5–15)
BUN: 13 mg/dL (ref 8–23)
CO2: 30 mmol/L (ref 22–32)
Calcium: 8.7 mg/dL — ABNORMAL LOW (ref 8.9–10.3)
Chloride: 99 mmol/L (ref 98–111)
Creatinine, Ser: 0.69 mg/dL (ref 0.44–1.00)
GFR, Estimated: >60 mL/min (ref >60)
Glucose, Bld: 92 mg/dL (ref 70–99)
Potassium: 3.7 mmol/L (ref 3.5–5.1)
Sodium: 137 mmol/L (ref 135–145)

## 2024-02-10 MED ORDER — AMIODARONE HCL 200 MG PO TABS
200.0000 mg | ORAL_TABLET | Freq: Two times a day (BID) | ORAL | Status: DC
  Administered 2024-02-10 – 2024-02-11 (×3): 200 mg via ORAL
  Filled 2024-02-10 (×3): qty 1

## 2024-02-10 NOTE — Plan of Care (Signed)

## 2024-02-10 NOTE — Progress Notes (Signed)
 PROGRESS NOTE    Abigail Wagner  WJX:[id number] DOB: 1951/03/05 DOA: 02/06/2024 PCP: Austine Lefort, MD   Brief Narrative:    Abigail Wagner is a 73 y.o. female with medical history significant for paroxysmal atrial fibrillation on Eliquis , dyslipidemia, hypertension, previously treated anal cancer, recurrent ovarian cancer, chronic diastolic heart failure, carotid artery stenosis, and depression who presented to the ED with complaints of worsening lower extremity edema as well as depression and overwhelm.  Patient was admitted with symptomatic atrial fibrillation with RVR as well as acute heart failure symptoms.  She is being aggressively diuresed and heart rate control being managed by cardiology with use of IV amiodarone  infusion.  Heart rates are showing improved control, transition to oral amiodarone  today and continue IV Lasix  for diuresis.  Anticipate discharge in a.m.  Assessment & Plan:   Principal Problem:   Atrial fibrillation with RVR (HCC) Active Problems:   Depression   History of ovarian cancer   Anal cancer (HCC)   Essential hypertension   Anxiety   Abdominal carcinomatosis (HCC)  Assessment and Plan:   Symptomatic atrial fibrillation with RVR - Started on IV amiodarone  on admission, but since heart rates are improving, will transition to oral amiodarone  on 5/18 -Started on metoprolol  per cardiology 5/16 - Appreciate cardiology evaluation - TSH 2.049, on repeat noted to be elevated over 6 and free T3 and T4 within normal limits   Acute diastolic CHF exacerbation secondary to above-improving - Prior 2D echocardiogram 05/2021 with LVEF 60-65% and grade 2 diastolic dysfunction - Noted to have significant BNP elevation and plan will be for diuresis with now Lasix  40 mg IV twice daily -Follow strict I's and O's and daily weights - Echo with LVEF 50%   Dyslipidemia/carotid artery stenosis - Continue Crestor    Hypertension - Does not appear to be on any home  antihypertensives - Monitor closely with diuresis -Noted to have significant elevations in blood pressures overnight, will add IV hydralazine  as needed   Recurrent ovarian cancer now with concern for peritoneal carcinomatosis - With prior history of anal cancer - Desires hospice care at home soon - Appreciate discussions with palliative care   Depression/anxiety - Continue home medications -Continue sitter at bedside   GERD - Continue PPI    DVT prophylaxis:apixaban  Code Status: DNR Family Communication: Spouse at bedside 5/18 Disposition Plan:  Status is: Inpatient Remains inpatient appropriate because: Need for IV medications.   Consultants:  Cardiology Palliative care  Procedures:  None  Antimicrobials:  None   Subjective: Patient seen and evaluated today with improved heart rates in the 80-90 bpm range.  She is quite upset and was wanting to leave AMA, but was able to be talked out of it by both myself and husband at bedside.  She understands now the need to continue monitoring and diuresis for 1 more day, but would like to leave tomorrow.  Objective: Vitals:   02/10/24 1130 02/10/24 1200 02/10/24 1329 02/10/24 1331  BP: (!) 150/86 130/68 (!) 146/102 (!) 120/91  Pulse:   88   Resp: (!) 23 18    Temp:      TempSrc:      SpO2:      Weight:      Height:        Intake/Output Summary (Last 24 hours) at 02/10/2024 1350 Last data filed at 02/10/2024 0413 Gross per 24 hour  Intake 1045.14 ml  Output 650 ml  Net 395.14 ml   [employer]  02/09/24 0800 02/10/24 0608 02/10/24 0800  Weight: 55.8 kg 51.1 kg 51 kg    Examination:  General exam: Appears calm and comfortable  Respiratory system: Clear to auscultation. Respiratory effort normal. Cardiovascular system: S1 & S2 heard, irregular tachycardic Gastrointestinal system: Abdomen is soft Central nervous system: Alert and awake Extremities: 1-2+ bilateral ankle edema noted Skin: No significant lesions  noted Psychiatry: Flat affect.    Data Reviewed: I have personally reviewed following labs and imaging studies  CBC: Recent Labs  Lab 02/06/24 0837 02/06/24 0845 02/09/24 0454  WBC 6.2  --  4.5  NEUTROABS 5.2  --   --   HGB 9.6* 10.5* 8.9*  HCT 29.9* 31.0* 28.7*  MCV 100.3*  --  100.0  PLT 394  --  280   Basic Metabolic Panel: Recent Labs  Lab 02/06/24 0837 02/06/24 0845 02/06/24 1049 02/07/24 0445 02/08/24 0453 02/09/24 0454 02/10/24 0548  NA 141 142  --  138 140 136 137  K 3.9 4.0  --  4.1 4.0 2.9* 3.7  CL 110 110  --  103 103 96* 99  CO2 22  --   --  25 25 30 30   GLUCOSE 84 82  --  82 104* 104* 92  BUN 11 12  --  11 13 11 13   CREATININE 0.91 0.90  --  0.79 0.88 0.70 0.69  CALCIUM  8.5*  --   --  8.4* 8.7* 8.3* 8.7*  MG  --   --  2.0 2.0 2.0 1.8  --    GFR: Estimated Creatinine Clearance: 50.4 mL/min (by C-G formula based on SCr of 0.69 mg/dL). Liver Function Tests: Recent Labs  Lab 02/06/24 0837  AST 22  ALT 15  ALKPHOS 46  BILITOT 0.4  PROT 6.3*  ALBUMIN 2.9*   No results for input(s): "LIPASE", "AMYLASE" in the last 168 hours. No results for input(s): "AMMONIA" in the last 168 hours. Coagulation Profile: No results for input(s): "INR", "PROTIME" in the last 168 hours. Cardiac Enzymes: No results for input(s): "CKTOTAL", "CKMB", "CKMBINDEX", "TROPONINI" in the last 168 hours. BNP (last 3 results) No results for input(s): "PROBNP" in the last 8760 hours. HbA1C: No results for input(s): "HGBA1C" in the last 72 hours. CBG: No results for input(s): "GLUCAP" in the last 168 hours. Lipid Profile: No results for input(s): "CHOL", "HDL", "LDLCALC", "TRIG", "CHOLHDL", "LDLDIRECT" in the last 72 hours. Thyroid  Function Tests: Recent Labs    02/08/24 0453  FREET4 0.98   Anemia Panel: No results for input(s): "VITAMINB12", "FOLATE", "FERRITIN", "TIBC", "IRON ", "RETICCTPCT" in the last 72 hours. Sepsis Labs: No results for input(s): "PROCALCITON",  "LATICACIDVEN" in the last 168 hours.  Recent Results (from the past 240 hours)  Culture, blood (routine x 2)     Status: None (Preliminary result)   Collection Time: 02/06/24  9:38 AM   Specimen: BLOOD  Result Value Ref Range Status   Specimen Description BLOOD BLOOD RIGHT ARM  Final   Special Requests   Final    Blood Culture adequate volume BOTTLES DRAWN AEROBIC AND ANAEROBIC   Culture   Final    NO GROWTH 4 DAYS Performed at [facility], [address], [place], [place] [address]    Report Status PENDING  Incomplete  Culture, blood (routine x 2)     Status: None (Preliminary result)   Collection Time: 02/06/24  9:43 AM   Specimen: BLOOD  Result Value Ref Range Status   Specimen Description BLOOD BLOOD LEFT ARM  Final   Special Requests   Final    BOTTLES DRAWN AEROBIC AND ANAEROBIC Blood Culture adequate volume   Culture   Final    NO GROWTH 4 DAYS Performed at [facility], [address], [place], [place] [address]    Report Status PENDING  Incomplete  MRSA Next Gen by PCR, Nasal     Status: None   Collection Time: 02/06/24 10:28 AM   Specimen: Nasal Mucosa; Nasal Swab  Result Value Ref Range Status   MRSA by PCR Next Gen NOT DETECTED NOT DETECTED Final    Comment: (NOTE) The GeneXpert MRSA Assay (FDA approved for NASAL specimens only), is one component of a comprehensive MRSA colonization surveillance program. It is not intended to diagnose MRSA infection nor to guide or monitor treatment for MRSA infections. Test performance is not FDA approved in patients less than 32 years old. Performed at [facility], [address] [place], [place] [address]          Radiology Studies: No results found.       Scheduled Meds:  amiodarone   200 mg Oral BID   apixaban   5 mg Oral BID   Chlorhexidine  Gluconate Cloth  6 each Topical Q0600   colestipol   2 g Oral Daily   divalproex   500 mg Oral q1800   escitalopram   20 mg Oral Daily   feeding supplement  237 mL Oral  BID BM   furosemide   40 mg Intravenous BID   lipase/protease/amylase  72,000 Units Oral TID AC   metoprolol  tartrate  25 mg Oral QID   nicotine   7 mg Transdermal Daily   pantoprazole   40 mg Oral Daily   rosuvastatin   20 mg Oral q1800   sodium chloride  flush  3 mL Intravenous Q12H      LOS: 4 days    Time spent: 55 minutes    Aceton Kinnear Loran Rock, DO [facility]  If 7PM-7AM, please contact night-coverage [email or web address] 02/10/2024, 1:50 PM

## 2024-02-11 ENCOUNTER — Inpatient Hospital Stay: Admitting: Dietician

## 2024-02-11 ENCOUNTER — Telehealth: Payer: Self-pay

## 2024-02-11 DIAGNOSIS — I4891 Unspecified atrial fibrillation: Secondary | ICD-10-CM | POA: Diagnosis not present

## 2024-02-11 LAB — BASIC METABOLIC PANEL WITH GFR
Anion gap: 7 (ref 5–15)
BUN: 14 mg/dL (ref 8–23)
CO2: 34 mmol/L — ABNORMAL HIGH (ref 22–32)
Calcium: 8.6 mg/dL — ABNORMAL LOW (ref 8.9–10.3)
Chloride: 96 mmol/L — ABNORMAL LOW (ref 98–111)
Creatinine, Ser: 0.68 mg/dL (ref 0.44–1.00)
GFR, Estimated: >60 mL/min (ref >60)
Glucose, Bld: 132 mg/dL — ABNORMAL HIGH (ref 70–99)
Potassium: 3.2 mmol/L — ABNORMAL LOW (ref 3.5–5.1)
Sodium: 137 mmol/L (ref 135–145)

## 2024-02-11 LAB — CBC
HCT: 31.9 % — ABNORMAL LOW (ref 36.0–46.0)
Hemoglobin: 9.9 g/dL — ABNORMAL LOW (ref 12.0–15.0)
MCH: 31.9 pg (ref 26.0–34.0)
MCHC: 31 g/dL (ref 30.0–36.0)
MCV: 102.9 fL — ABNORMAL HIGH (ref 80.0–100.0)
Platelets: 317 10*3/uL (ref 150–400)
RBC: 3.1 MIL/uL — ABNORMAL LOW (ref 3.87–5.11)
RDW: 17.9 % — ABNORMAL HIGH (ref 11.5–15.5)
WBC: 5.6 10*3/uL (ref 4.0–10.5)
nRBC: 0 % (ref 0.0–0.2)

## 2024-02-11 LAB — CULTURE, BLOOD (ROUTINE X 2)
Culture: NO GROWTH
Culture: NO GROWTH
Special Requests: ADEQUATE
Special Requests: ADEQUATE

## 2024-02-11 LAB — MAGNESIUM: Magnesium: 1.8 mg/dL (ref 1.7–2.4)

## 2024-02-11 MED ORDER — MAGNESIUM SULFATE 2 GM/50ML IV SOLN
2.0000 g | Freq: Once | INTRAVENOUS | Status: AC
  Administered 2024-02-11: 2 g via INTRAVENOUS
  Filled 2024-02-11: qty 50

## 2024-02-11 MED ORDER — METOPROLOL SUCCINATE ER 50 MG PO TB24: 50.0000 mg | ORAL_TABLET | Freq: Two times a day (BID) | ORAL | 1 refills | Status: DC

## 2024-02-11 MED ORDER — FUROSEMIDE 40 MG PO TABS
40.0000 mg | ORAL_TABLET | Freq: Every day | ORAL | 11 refills | Status: AC
Start: 2024-02-11 — End: 2025-02-10

## 2024-02-11 MED ORDER — POTASSIUM CHLORIDE CRYS ER 20 MEQ PO TBCR
40.0000 meq | EXTENDED_RELEASE_TABLET | Freq: Once | ORAL | Status: AC
  Administered 2024-02-11: 40 meq via ORAL
  Filled 2024-02-11: qty 2

## 2024-02-11 MED ORDER — METOPROLOL SUCCINATE ER 50 MG PO TB24
50.0000 mg | ORAL_TABLET | Freq: Two times a day (BID) | ORAL | Status: DC
  Administered 2024-02-11: 50 mg via ORAL
  Filled 2024-02-11: qty 1

## 2024-02-11 MED ORDER — POTASSIUM CHLORIDE CRYS ER 20 MEQ PO TBCR: 40.0000 meq | EXTENDED_RELEASE_TABLET | Freq: Once | ORAL | Status: DC

## 2024-02-11 MED ORDER — AMIODARONE HCL 200 MG PO TABS: 200.0000 mg | ORAL_TABLET | Freq: Two times a day (BID) | ORAL | 0 refills | Status: DC

## 2024-02-11 MED ORDER — POTASSIUM CHLORIDE CRYS ER 20 MEQ PO TBCR: 40.0000 meq | EXTENDED_RELEASE_TABLET | Freq: Every day | ORAL | 1 refills | Status: DC

## 2024-02-11 MED ORDER — AMIODARONE HCL 200 MG PO TABS: 200.0000 mg | ORAL_TABLET | Freq: Every day | ORAL | 3 refills | Status: AC

## 2024-02-11 MED ORDER — LOPERAMIDE HCL 2 MG PO CAPS
4.0000 mg | ORAL_CAPSULE | Freq: Once | ORAL | Status: AC
  Administered 2024-02-11: 4 mg via ORAL
  Filled 2024-02-11: qty 2

## 2024-02-11 NOTE — TOC Transition Note (Signed)
 Transition of Care [facility]) - Discharge Note   Patient Details  Name: Abigail Wagner MRN: [id number] Date of Birth: 1951/08/05  Transition of Care [facility]) CM/SW Contact:  Grandville Lax, LCSWA Phone Number: 02/11/2024, 11:38 AM   Clinical Narrative:    CSW updated that pt is medically stable for discharge home today. CSW updated Kw[place] with [facility] of plan for D/C. CSW completed med necessity and sent to floor for RN. CSW updated RN that EMS can be called when they are ready. TOC signing off.   Final next level of care: Home w Hospice Care Barriers to Discharge: Barriers Resolved   Patient Goals and CMS Choice Patient states their goals for this hospitalization and ongoing recovery are:: return home CMS Medicare.gov Compare Post Acute Care list provided to:: Patient Choice offered to / list presented to : Patient, Spouse [place] ownership interest in [facility].provided to:: Patient    Discharge Placement                       Discharge Plan and Services Additional resources added to the After Visit Summary for   In-house Referral: Clinical Social Work, Hospice / Palliative Care Discharge Planning Services: CM Consult Post Acute Care Choice: Hospice                               Social Drivers of Health (SDOH) Interventions SDOH Screenings   Food Insecurity: No Food Insecurity (02/06/2024)  Housing: Low Risk  (02/06/2024)  Transportation Needs: No Transportation Needs (02/06/2024)  Utilities: Not At Risk (02/06/2024)  Alcohol Screen: Low Risk  (09/07/2023)  Depression (PHQ2-9): Medium Risk (01/01/2024)  Financial Resource Strain: Low Risk  (09/07/2023)  Physical Activity: Inactive (09/07/2023)  Social Connections: Moderately Integrated (02/06/2024)  Stress: Stress Concern Present (09/07/2023)  Tobacco Use: Medium Risk (02/06/2024)     Readmission Risk Interventions    02/11/2024   11:37 AM 02/07/2024    2:56 PM 02/06/2024    8:18 PM   Readmission Risk Prevention Plan  Transportation Screening Complete Complete Complete  PCP or Specialist Appt within 5-7 Days   Complete  Home Care Screening  Complete Complete  Medication Review (RN CM)  Complete Complete  HRI or Home Care Consult Complete    Social Work Consult for Recovery Care Planning/Counseling Complete    Palliative Care Screening Complete    Medication Review Oceanographer) Complete

## 2024-02-11 NOTE — Telephone Encounter (Signed)
 Copied from CRM [id number]. Topic: General - Other >> Feb 11, 2024 10:35 AM Virgia Griffins wrote: Florinda Hush from [facility] would like to know if Dr. Cheril Cork would be the Hospice dr for pt.  Call back number : [phone] ext [phone] Direct line for Kwante.

## 2024-02-11 NOTE — Progress Notes (Addendum)
 Rounding Note    Patient Name: Abigail Wagner Date of Encounter: 02/11/2024  [facility] Cardiologist: Teddie Favre, MD   Subjective   Pt denies any cardiac complaints but is very agitated. Husband and patient states that they are going home today. Husband states that patient was not on daily diuretic but wants daily on discharge to control swelling.  Husband stated at night, patient has been very anxious and agitated. Also concerns that her lorazepam  medications have been switched from home schedule.   Inpatient Medications    Scheduled Meds:  amiodarone   200 mg Oral BID   apixaban   5 mg Oral BID   Chlorhexidine  Gluconate Cloth  6 each Topical Q0600   colestipol   2 g Oral Daily   divalproex   500 mg Oral q1800   escitalopram   20 mg Oral Daily   feeding supplement  237 mL Oral BID BM   furosemide   40 mg Intravenous BID   lipase/protease/amylase  72,000 Units Oral TID AC   metoprolol  tartrate  25 mg Oral QID   nicotine   7 mg Transdermal Daily   pantoprazole   40 mg Oral Daily   rosuvastatin   20 mg Oral q1800   sodium chloride  flush  3 mL Intravenous Q12H   Continuous Infusions:  PRN Meds: acetaminophen , labetalol , LORazepam , ondansetron  (ZOFRAN ) IV, oxyCODONE -acetaminophen , sodium chloride  flush   Vital Signs    Vitals:   02/10/24 1644 02/10/24 1654 02/10/24 1742 02/10/24 2200  BP:  (!) 153/78 135/87 (!) 171/115  Pulse:  91  92  Resp:    18  Temp: (!) 96.9 F (36.1 C)   98.1 F (36.7 C)  TempSrc: Axillary   Oral  SpO2:    99%  Weight:      Height:        Intake/Output Summary (Last 24 hours) at 02/11/2024 0749 Last data filed at 02/10/2024 2210 Gross per 24 hour  Intake 162.17 ml  Output --  Net 162.17 ml      02/10/2024    8:00 AM 02/10/2024    6:08 AM 02/09/2024    8:00 AM  Last 3 Weights  Weight (lbs) 112 lb 7 oz 112 lb 11.2 oz 123 lb 0.3 oz  Weight (kg) 51 kg 51.12 kg 55.8 kg      Telemetry    Afib, 90's - Personally  Reviewed  Physical Exam   GEN: Sitting on the side of bed  in No acute distress.  Very anxious and angry. Husband at bed side. Not hooked to telemetry.  Neck: No JVD Cardiac: irreg irreg, no murmurs, rubs, or gallops.  Respiratory: Clear to auscultation bilaterally. GI: Soft, nontender, non-distended  MS: No edema; No deformity. Neuro:  Nonfocal  Psych: Normal affect   Labs    High Sensitivity Troponin:   Recent Labs  Lab 02/06/24 0837 02/06/24 1049  TROPONINIHS 21* 16     Chemistry Recent Labs  Lab 02/06/24 0837 02/06/24 0845 02/08/24 0453 02/09/24 0454 02/10/24 0548 02/11/24 0339  NA 141   < > 140 136 137 137  K 3.9   < > 4.0 2.9* 3.7 3.2*  CL 110   < > 103 96* 99 96*  CO2 22   < > 25 30 30  34*  GLUCOSE 84   < > 104* 104* 92 132*  BUN 11   < > 13 11 13 14   CREATININE 0.91   < > 0.88 0.70 0.69 0.68  CALCIUM  8.5*   < > 8.7*  8.3* 8.7* 8.6*  MG  --    < > 2.0 1.8  --  1.8  PROT 6.3*  --   --   --   --   --   ALBUMIN 2.9*  --   --   --   --   --   AST 22  --   --   --   --   --   ALT 15  --   --   --   --   --   ALKPHOS 46  --   --   --   --   --   BILITOT 0.4  --   --   --   --   --   GFRNONAA >60   < > >60 >60 >60 >60  ANIONGAP 9   < > 12 10 8 7    < > = values in this interval not displayed.    Lipids No results for input(s): "CHOL", "TRIG", "HDL", "LABVLDL", "LDLCALC", "CHOLHDL" in the last 168 hours.  Hematology Recent Labs  Lab 02/06/24 0837 02/06/24 0845 02/09/24 0454 02/11/24 0339  WBC 6.2  --  4.5 5.6  RBC 2.98*  --  2.87* 3.10*  HGB 9.6* 10.5* 8.9* 9.9*  HCT 29.9* 31.0* 28.7* 31.9*  MCV 100.3*  --  100.0 102.9*  MCH 32.2  --  31.0 31.9  MCHC 32.1  --  31.0 31.0  RDW 18.8*  --  18.1* 17.9*  PLT 394  --  280 317   Thyroid   Recent Labs  Lab 02/07/24 0445 02/08/24 0453  TSH 6.385*  --   FREET4  --  0.98    BNP Recent Labs  Lab 02/06/24 0837  BNP 1,408.0*    DDimer No results for input(s): "DDIMER" in the last 168 hours.   Radiology     No results found.  Cardiac Studies   ECHO 02/07/2024 IMPRESSIONS   1. Left ventricular ejection fraction, by estimation, is 50%. The left  ventricle has low normal function. The left ventricle has no regional wall  motion abnormalities. There is mild left ventricular hypertrophy. Left  ventricular diastolic parameters are   indeterminate.   2. Right ventricular systolic function is low normal. The right  ventricular size is mildly enlarged. There is moderately elevated  pulmonary artery systolic pressure.   3. Left atrial size was severely dilated.   4. Right atrial size was mildly dilated.   5. Moderate pleural effusion in the left lateral region.   6. The mitral valve is abnormal. Moderate mitral valve regurgitation. No  evidence of mitral stenosis.   7. The tricuspid valve is abnormal. Tricuspid valve regurgitation is mild  to moderate.   8. Continous wave Doppler of the aortic valve is incomplete. Grossly  there is no significant aortic stenosis. . The aortic valve was not well  visualized. There is mild calcification of the aortic valve. There is mild  thickening of the aortic valve.  Aortic valve regurgitation is mild.   9. The inferior vena cava is dilated in size with <50% respiratory  variability, suggesting right atrial pressure of 15 mmHg.   Patient Profile     MONTIA HASLIP is a 73 y.o. female with a hx of paroxysmal Afib (on Eliquis  and Amio), moderate Left carotid artery stenosis (US  06/2023), HLD, HTN,  previous treated anal cancer, recurrent ovarian cancer (followed by Dr. Cheree Cords), depression who is being seen 02/06/2024 for the evaluation of HF and afib with rvr  at the request of Dr. Randal Bury   Assessment & Plan    Afib with RVR  - New dx of afib in 06/2021 & Started Amio due to hypotension.  SR as recent as 01/01/2024. On home Amio 200 mg daily - ECHO 02/07/2024: EF 50%, LA severely dilats, RA mildly dilated  - EKG showed A-fib with RVR, HR 133. Telemetry this  a.m. showed Afib, Hr 90's. Currently, denies any palpitations or dizziness.  - Considered cardioversion but no longer considering with recurrent cancer, DNR status, and home hospice.  - Started on IV Amio continue and transition to p.o. on 5/18.  Continue Eliquis  5 mg BID, Amio 200 mg BID - D/C Lopressor  25 mg 4 times daily and ordered Toprol  XL 50 mg BID   Elevated BNP Acute on Chronic HFpEF - ECHO 05/2021: 60-65%, G2DD, trivial MVR, moderate TVR, trivial AVR, mild to moderate AV calcification w/o AS, L/R atria normal size.  - ECHO 02/07/2024: EF 50%, mild LVH, low systolic RV function, mildly enlarged RV, moderately elevated PASP, moderate pleural effusion in left lateral region, moderate MVR, mild to moderate TVR, mild calcification/thickening of AV, mild AVR, IVC dilated with RA pressure 15 mmHG  - HFpEF likely exacerbated by her A-fib - I/O: -3,373, wt 131 > 112, Cr 0.68 - Monitor I/O, weight and renal function - Appear Euvolemic on exam.  - D/C IV lasix  this am. Husband wants patient to be on daily diuretic. Can consider daily Lasix  40 mg. Will discuss with MD.   Hypokalemia - K this am 3.2, Mg 1.8. Supplement with 40 meq. Ordered second dose for evening and Mg. Not sure if patient with receive Mg since agitated.  - in setting of arrhythmia, goal K > 4, Mg>2  Hypertension - Not on any home hypertensives - BP noted to be elevated overnight.  5/18 at 2200 171/115 - Per husband, patient has been very anxious and agitated at night which could be contributing to elevated BP at night.  -  D/C lopressor  and ordered Toprol  50 mg BID  HLD  Left carotid artery stenosis  - 06/2023 carotid US : no significant RICA stenosis with moderate 50 to 69% LICA stenosis.  - 06/2023: LDL 49 - Continue Crestor  20 mg  - Continue to follow up. Repeat Carotid US  in 06/2024  Ovarian cancer - followed by oncology     For questions or updates, please contact [facility] Please consult  [email or web address] for contact info under        Signed, Metta Actis, PA-C  02/11/2024, 7:49 AM     Attending Note  Patient seen and discussed with PA Jorie Newness, I agree with her documentation.     1.PAF - on amiodarone  200mg  daily at home - SR as recent as 01/01/24 EKG. Presents to ER in afib with RVR - started IV amio to reload and hopefully convert her back to SR - tele shows afib 80s to low 100s. Currently on oral amio 200mg  bid, lopressor  25mg  every 6 hours consolidating to toprol  50mg  bid. Her sinus rate is in the low 60s, would not be extra aggressive with av nodal titration.  - initially considered cardioversion. She has recurrent cancer and has elected for DNR status and  home hospice and thus would not pursue aggressive procedures.  - from chart review new diagnosis of afib with RVR 06/2021. Was hypotensive at the time and thus amiodarone  was started, has been on since - plan for oral amio 200mg  bid x 3  weeks, then 200mg  daily.  - continue eliquis  for stroke prevention.      2.Acute HFpEF - 01/2024 echo: LVE 50%, indet diastolic, severe LAE, mod MR. Dildated fixed IVC - BNP 1408, CXR probable edema - presented with leg edema - HFpEF likely exacerbated by her afib with RVR  -has been on IV lasix  40mg  bid, stopped after this mornings dose. I/Os are incomplete. By standings weights she is down from 123 -->112 lbs. Renal function is stable. Change to oral lasix  40mg  daily.        3. Ovarian cancer - followed by oncology - Diagnosed in 2015, status post debulking surgery followed by 6 cycles of carboplatin and Taxol completed in March 2016.  -recent concerns for recurrence.  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 - from onc note plans to repeat CT scan as outpatient.  - seen by palliative this admission. From there note has elected DNR, home hospice at  discharge.     Patient strongly wants to go home today. I think reasonable from a cardiac standpoint, we will arrange out patient f/u.   Armida Lander MD

## 2024-02-11 NOTE — Discharge Summary (Signed)
 Physician Discharge Summary  Abigail Wagner ZOX:[id number] DOB: [birth year] DOA: 02/06/2024  PCP: Abigail Lefort, MD  Admit date: 02/06/2024  Discharge date: 02/11/2024  Admitted From:Home  Disposition:  Home  Recommendations for Outpatient Follow-up:  Follow up with PCP in 1-2 weeks Follow-up with home hospice agency Remain on amiodarone  as prescribed with 200 mg twice daily for 3 weeks and then 200 mg daily thereafter Lasix  40 mg daily and potassium 40 mill equivalents daily Toprol -XL 50 mg twice daily Continue other home medications as prior  Home Health: None  Equipment/Devices: None  Discharge Condition:Stable  CODE STATUS: DNR  Diet recommendation: Heart Healthy  Brief/Interim Summary: Abigail Wagner is a 73 y.o. female with medical history significant for paroxysmal atrial fibrillation on Eliquis , dyslipidemia, hypertension, previously treated anal cancer, recurrent ovarian cancer, chronic diastolic heart failure, carotid artery stenosis, and depression who presented to the ED with complaints of worsening lower extremity edema as well as depression and overwhelm.  Patient was admitted with symptomatic atrial fibrillation with RVR as well as acute heart failure symptoms.  She is being aggressively diuresed and heart rate control being managed by cardiology with use of IV amiodarone  infusion.  Her heart rates have shown significant improvement in control and she is transition to oral amiodarone  today as well as Toprol -XL twice daily.  Cardiology recommending transition to oral Lasix .  Patient is quite eager for discharge and has home hospice arrangements at this point.  No other acute events or concerns noted.  Discharge Diagnoses:  Principal Problem:   Atrial fibrillation with RVR (HCC) Active Problems:   Depression   History of ovarian cancer   Anal cancer (HCC)   Essential hypertension   Anxiety   Abdominal carcinomatosis (HCC)  Principal discharge diagnosis: Symptomatic  atrial fibrillation with RVR along with acute diastolic CHF exacerbation.  Discharge Instructions  Discharge Instructions     Diet - low sodium heart healthy   Complete by: As directed    Increase activity slowly   Complete by: As directed       Allergies as of 02/11/2024       Reactions   Morphine  And Codeine    Delirium with sbo        Medication List     TAKE these medications    amiodarone  200 MG tablet Commonly known as: PACERONE  Take 1 tablet (200 mg total) by mouth 2 (two) times daily for 21 days. What changed: You were already taking a medication with the same name, and this prescription was added. Make sure you understand how and when to take each.   amiodarone  200 MG tablet Commonly known as: PACERONE  Take 1 tablet (200 mg total) by mouth daily. Start taking on: March 04, 2024 What changed: These instructions start on March 04, 2024. If you are unsure what to do until then, ask your doctor or other care provider.   BC HEADACHE POWDER PO Take 1-2 Packages by mouth daily at 12 noon.   clotrimazole 1 % cream Commonly known as: LOTRIMIN Apply 1 application topically 2 (two) times daily as needed (itching around ankles).   Co Q 10 100 MG Caps Take 100 mg by mouth daily.   colestipol  1 g tablet Commonly known as: COLESTID  Take 2 tablets (2 g total) by mouth daily.   divalproex  500 MG 24 hr tablet Commonly known as: Depakote  ER Take 1 tablet (500 mg total) by mouth daily.   Eliquis  5 MG Tabs tablet Generic drug: apixaban  TAKE  1 TABLET BY MOUTH TWICE A DAY   escitalopram  20 MG tablet Commonly known as: LEXAPRO  Take 1 tablet (20 mg total) by mouth daily.   estradiol  0.1 MG/GM vaginal cream Commonly known as: ESTRACE  Apply a pea size amount of cream to urethral area of vagina twice weekly   furosemide  40 MG tablet Commonly known as: Lasix  Take 1 tablet (40 mg total) by mouth daily.   HAIR SKIN & NAILS ADVANCED PO Take 1 tablet by mouth 3 (three)  times daily.   IRON  27 PO Take 65 mg by mouth daily. Pt is taking 65mg /325 daily   lipase/protease/amylase 04540 UNITS Cpep capsule Commonly known as: Creon  Take 2 capsules (72,000 Units total) by mouth 3 (three) times daily before meals. Two capsules with meals and one with snacks.   LORazepam  0.5 MG tablet Commonly known as: ATIVAN  Take 1 tablet (0.5 mg total) by mouth 3 (three) times daily as needed for anxiety.   metoprolol  succinate 50 MG 24 hr tablet Commonly known as: TOPROL -XL Take 1 tablet (50 mg total) by mouth 2 (two) times daily. Take with or immediately following a meal.   multivitamin tablet Take 1 tablet by mouth daily.   omeprazole  40 MG capsule Commonly known as: PRILOSEC TAKE 1 CAPSULE (40 MG TOTAL) BY MOUTH DAILY.   OVER THE COUNTER MEDICATION LYSENE 1 daily per husband   oxyCODONE -acetaminophen  7.5-325 MG tablet Commonly known as: PERCOCET Take 1 tablet by mouth every 6 (six) hours as needed for severe pain (pain score 7-10). This script should last 30 days.  60 per month   potassium chloride  SA 20 MEQ tablet Commonly known as: KLOR-CON  M Take 2 tablets (40 mEq total) by mouth daily. What changed: how much to take   rosuvastatin  20 MG tablet Commonly known as: CRESTOR  TAKE 1 TABLET BY MOUTH EVERY DAY   Vitamin D -3 125 MCG (5000 UT) Tabs Take 5,000 Units by mouth daily.        Follow-up Information     Abigail Lefort, MD. Schedule an appointment as soon as possible for a visit in 1 week(s).   Specialty: Family Medicine Contact information: [address] [place] [address] [phone]                Allergies  Allergen Reactions   Morphine  And Codeine     Delirium with sbo    Consultations: Cardiology   Procedures/Studies: ECHOCARDIOGRAM COMPLETE Result Date: 02/07/2024    ECHOCARDIOGRAM REPORT   Patient Name:   Abigail Wagner Date of Exam: 02/07/2024 Medical Rec #:  [id number]    Height:       66.0 in Accession  #:    [id number]   Weight:       132.5 lb Date of Birth:  1951/03/03     BSA:          1.679 m Patient Age:    73 years     BP:           144/87 mmHg Patient Gender: F            HR:           109 bpm. Exam [facility] Procedure: 2D Echo, Cardiac Doppler and Color Doppler (Both Spectral and Color            Flow Doppler were utilized during procedure). Indications:    Congestive Heart Failure I50.9  History:        Patient  has prior history of Echocardiogram examinations, most                 recent 06/14/2021. Arrythmias:Atrial Fibrillation; Risk                 Factors:Hypertension, Dyslipidemia and Cigarette smoker. History                 of ovarian and Anal carcinoma.  Sonographer:    Denese Finn RCS Referring Phys: [id number] Maliik Karner D [facility] IMPRESSIONS  1. Left ventricular ejection fraction, by estimation, is 50%. The left ventricle has low normal function. The left ventricle has no regional wall motion abnormalities. There is mild left ventricular hypertrophy. Left ventricular diastolic parameters are  indeterminate.  2. Right ventricular systolic function is low normal. The right ventricular size is mildly enlarged. There is moderately elevated pulmonary artery systolic pressure.  3. Left atrial size was severely dilated.  4. Right atrial size was mildly dilated.  5. Moderate pleural effusion in the left lateral region.  6. The mitral valve is abnormal. Moderate mitral valve regurgitation. No evidence of mitral stenosis.  7. The tricuspid valve is abnormal. Tricuspid valve regurgitation is mild to moderate.  8. Continous wave Doppler of the aortic valve is incomplete. Grossly there is no significant aortic stenosis. . The aortic valve was not well visualized. There is mild calcification of the aortic valve. There is mild thickening of the aortic valve. Aortic valve regurgitation is mild.  9. The inferior vena cava is dilated in size with <50% respiratory variability, suggesting right atrial pressure of  15 mmHg. FINDINGS  Left Ventricle: Left ventricular ejection fraction, by estimation, is 50%. The left ventricle has low normal function. The left ventricle has no regional wall motion abnormalities. The left ventricular internal cavity size was normal in size. There is mild left ventricular hypertrophy. Left ventricular diastolic parameters are indeterminate. Right Ventricle: The right ventricular size is mildly enlarged. Right vetricular wall thickness was not well visualized. Right ventricular systolic function is low normal. There is moderately elevated pulmonary artery systolic pressure. The tricuspid regurgitant velocity is 2.88 m/s, and with an assumed right atrial pressure of 15 mmHg, the estimated right ventricular systolic pressure is 48.2 mmHg. Left Atrium: Left atrial size was severely dilated. Right Atrium: Right atrial size was mildly dilated. Pericardium: There is no evidence of pericardial effusion. Mitral Valve: The mitral valve is abnormal. There is mild thickening of the mitral valve leaflet(s). There is mild calcification of the mitral valve leaflet(s). Mild mitral annular calcification. Moderate mitral valve regurgitation. No evidence of mitral  valve stenosis. Tricuspid Valve: The tricuspid valve is abnormal. Tricuspid valve regurgitation is mild to moderate. No evidence of tricuspid stenosis. Aortic Valve: Continous wave Doppler of the aortic valve is incomplete. Grossly there is no significant aortic stenosis. The aortic valve was not well visualized. There is mild calcification of the aortic valve. There is mild thickening of the aortic valve. There is mild aortic valve annular calcification. Aortic valve regurgitation is mild. Aortic regurgitation PHT measures 561 msec. Pulmonic Valve: The pulmonic valve was not well visualized. Pulmonic valve regurgitation is not visualized. No evidence of pulmonic stenosis. Aorta: The aortic root is normal in size and structure. Venous: The inferior vena  cava is dilated in size with less than 50% respiratory variability, suggesting right atrial pressure of 15 mmHg. IAS/Shunts: No atrial level shunt detected by color flow Doppler. Additional Comments: There is a moderate pleural effusion in the left lateral region.  LEFT VENTRICLE PLAX 2D LVIDd:         4.00 cm LVIDs:         2.95 cm LV PW:         1.25 cm LV IVS:        1.10 cm LVOT diam:     1.70 cm LV SV:         31 LV SV Index:   18 LVOT Area:     2.27 cm  LV Volumes (MOD) LV vol d, MOD A2C: 50.9 ml LV vol d, MOD A4C: 51.7 ml LV vol s, MOD A2C: 28.3 ml LV vol s, MOD A4C: 27.6 ml LV SV MOD A2C:     22.6 ml LV SV MOD A4C:     51.7 ml LV SV MOD BP:      23.8 ml RIGHT VENTRICLE TAPSE (M-mode): 1.1 cm LEFT ATRIUM              Index        RIGHT ATRIUM           Index LA diam:        4.65 cm  2.77 cm/m   RA Area:     19.90 cm LA Vol (A2C):   104.0 ml 61.95 ml/m  RA Volume:   57.40 ml  34.19 ml/m LA Vol (A4C):   110.0 ml 65.52 ml/m LA Biplane Vol: 108.0 ml 64.33 ml/m  AORTIC VALVE LVOT Vmax:   73.35 cm/s LVOT Vmean:  47.750 cm/s LVOT VTI:    0.136 m AI PHT:      561 msec  AORTA Ao Root diam: 3.20 cm MITRAL VALVE                  TRICUSPID VALVE MV Area (PHT): 5.97 cm       TR Peak grad:   33.2 mmHg MV Decel Time: 127 msec       TR Vmax:        288.00 cm/s MR Peak grad:    101.6 mmHg MR Mean grad:    65.0 mmHg    SHUNTS MR Vmax:         504.00 cm/s  Systemic VTI:  0.14 m MR Vmean:        376.0 cm/s   Systemic Diam: 1.70 cm MR PISA:         2.26 cm MR PISA Eff ROA: 9 mm MR PISA Radius:  0.60 cm MV E velocity: 133.00 cm/s Armida Lander MD Electronically signed by Armida Lander MD Signature Date/Time: 02/07/2024/4:36:04 PM    Final    DG Chest Port 1 View Result Date: 02/06/2024 CLINICAL DATA:  Bilateral lower extremity swelling, palpitations. EXAM: PORTABLE CHEST 1 VIEW COMPARISON:  January 02, 2024. FINDINGS: Stable cardiomediastinal silhouette. Right internal jugular Port-A-Cath is unchanged. Probable  bibasilar pulmonary edema or pneumonia is noted with small left pleural effusion. Bony thorax is unremarkable. IMPRESSION: Probable bibasilar edema or pneumonia is noted with small left pleural effusion. Electronically Signed   By: Rosalene Colon M.D.   On: 02/06/2024 08:40     Discharge Exam: Vitals:   02/11/24 0800 02/11/24 1004  BP:  (!) 146/121  Pulse:  98  Resp:    Temp: 98.3 F (36.8 C)   SpO2:     Vitals:   02/10/24 1742 02/10/24 2200 02/11/24 0800 02/11/24 1004  BP: 135/87 (!) 171/115  (!) 146/121  Pulse:  92  98  Resp:  18  Temp:  98.1 F (36.7 C) 98.3 F (36.8 C)   TempSrc:  Oral Oral   SpO2:  99%    Weight:      Height:        General: Pt is alert, awake, not in acute distress Cardiovascular: Irregular, S1/S2 +, no rubs, no gallops Respiratory: CTA bilaterally, no wheezing, no rhonchi Abdominal: Soft, NT, ND, bowel sounds + Extremities: Mild edema, no cyanosis    The results of significant diagnostics from this hospitalization (including imaging, microbiology, ancillary and laboratory) are listed below for reference.     Microbiology: Recent Results (from the past 240 hours)  Culture, blood (routine x 2)     Status: None   Collection Time: 02/06/24  9:38 AM   Specimen: BLOOD  Result Value Ref Range Status   Specimen Description BLOOD BLOOD RIGHT ARM  Final   Special Requests   Final    Blood Culture adequate volume BOTTLES DRAWN AEROBIC AND ANAEROBIC   Culture   Final    NO GROWTH 5 DAYS Performed at [facility], [address], [place], [place] [address]    Report Status 02/11/2024 FINAL  Final  Culture, blood (routine x 2)     Status: None   Collection Time: 02/06/24  9:43 AM   Specimen: BLOOD  Result Value Ref Range Status   Specimen Description BLOOD BLOOD LEFT ARM  Final   Special Requests   Final    BOTTLES DRAWN AEROBIC AND ANAEROBIC Blood Culture adequate volume   Culture   Final    NO GROWTH 5 DAYS Performed at [facility],  [address], [place], [place] [address]    Report Status 02/11/2024 FINAL  Final  MRSA Next Gen by PCR, Nasal     Status: None   Collection Time: 02/06/24 10:28 AM   Specimen: Nasal Mucosa; Nasal Swab  Result Value Ref Range Status   MRSA by PCR Next Gen NOT DETECTED NOT DETECTED Final    Comment: (NOTE) The GeneXpert MRSA Assay (FDA approved for NASAL specimens only), is one component of a comprehensive MRSA colonization surveillance program. It is not intended to diagnose MRSA infection nor to guide or monitor treatment for MRSA infections. Test performance is not FDA approved in patients less than 75 years old. Performed at [facility], [address], [place], [place] [address]      Labs: BNP (last 3 results) Recent Labs    02/06/24 0837  BNP 1,408.0*   Basic Metabolic Panel: Recent Labs  Lab 02/06/24 1049 02/07/24 0445 02/08/24 0453 02/09/24 0454 02/10/24 0548 02/11/24 0339  NA  --  138 140 136 137 137  K  --  4.1 4.0 2.9* 3.7 3.2*  CL  --  103 103 96* 99 96*  CO2  --  25 25 30 30  34*  GLUCOSE  --  82 104* 104* 92 132*  BUN  --  11 13 11 13 14   CREATININE  --  0.79 0.88 0.70 0.69 0.68  CALCIUM   --  8.4* 8.7* 8.3* 8.7* 8.6*  MG 2.0 2.0 2.0 1.8  --  1.8   Liver Function Tests: Recent Labs  Lab 02/06/24 0837  AST 22  ALT 15  ALKPHOS 46  BILITOT 0.4  PROT 6.3*  ALBUMIN 2.9*   No results for input(s): "LIPASE", "AMYLASE" in the last 168 hours. No results for input(s): "AMMONIA" in the last 168 hours. CBC: Recent Labs  Lab 02/06/24 1308 02/06/24 0845 02/09/24 0454 02/11/24 6578  WBC 6.2  --  4.5 5.6  NEUTROABS 5.2  --   --   --   HGB 9.6* 10.5* 8.9* 9.9*  HCT 29.9* 31.0* 28.7* 31.9*  MCV 100.3*  --  100.0 102.9*  PLT 394  --  280 317   Cardiac Enzymes: No results for input(s): "CKTOTAL", "CKMB", "CKMBINDEX", "TROPONINI" in the last 168 hours. BNP: Invalid input(s): "POCBNP" CBG: No results for input(s): "GLUCAP" in the last 168 hours. D-Dimer No  results for input(s): "DDIMER" in the last 72 hours. Hgb A1c No results for input(s): "HGBA1C" in the last 72 hours. Lipid Profile No results for input(s): "CHOL", "HDL", "LDLCALC", "TRIG", "CHOLHDL", "LDLDIRECT" in the last 72 hours. Thyroid  function studies No results for input(s): "TSH", "T4TOTAL", "T3FREE", "THYROIDAB" in the last 72 hours.  Invalid input(s): "FREET3" Anemia work up No results for input(s): "VITAMINB12", "FOLATE", "FERRITIN", "TIBC", "IRON ", "RETICCTPCT" in the last 72 hours. Urinalysis    Component Value Date/Time   COLORURINE DARK YELLOW 10/13/2021 1206   APPEARANCEUR Clear 02/13/2022 1014   LABSPEC 1.020 10/13/2021 1206   PHURINE 6.0 10/13/2021 1206   GLUCOSEU Negative 02/13/2022 1014   HGBUR NEGATIVE 10/13/2021 1206   BILIRUBINUR Negative 02/13/2022 1014   KETONESUR NEGATIVE 10/13/2021 1206   PROTEINUR 2+ (A) 02/13/2022 1014   PROTEINUR TRACE (A) 10/13/2021 1206   UROBILINOGEN 0.2 05/27/2014 1428   NITRITE Negative 02/13/2022 1014   NITRITE NEGATIVE 10/13/2021 1206   LEUKOCYTESUR 1+ (A) 02/13/2022 1014   LEUKOCYTESUR NEGATIVE 10/13/2021 1206   Sepsis Labs Recent Labs  Lab 02/06/24 0837 02/09/24 0454 02/11/24 0339  WBC 6.2 4.5 5.6   Microbiology Recent Results (from the past 240 hours)  Culture, blood (routine x 2)     Status: None   Collection Time: 02/06/24  9:38 AM   Specimen: BLOOD  Result Value Ref Range Status   Specimen Description BLOOD BLOOD RIGHT ARM  Final   Special Requests   Final    Blood Culture adequate volume BOTTLES DRAWN AEROBIC AND ANAEROBIC   Culture   Final    NO GROWTH 5 DAYS Performed at [facility], [address], [place], [place] [address]    Report Status 02/11/2024 FINAL  Final  Culture, blood (routine x 2)     Status: None   Collection Time: 02/06/24  9:43 AM   Specimen: BLOOD  Result Value Ref Range Status   Specimen Description BLOOD BLOOD LEFT ARM  Final   Special Requests   Final    BOTTLES DRAWN  AEROBIC AND ANAEROBIC Blood Culture adequate volume   Culture   Final    NO GROWTH 5 DAYS Performed at [facility], [address] [place], [place] [address]    Report Status 02/11/2024 FINAL  Final  MRSA Next Gen by PCR, Nasal     Status: None   Collection Time: 02/06/24 10:28 AM   Specimen: Nasal Mucosa; Nasal Swab  Result Value Ref Range Status   MRSA by PCR Next Gen NOT DETECTED NOT DETECTED Final    Comment: (NOTE) The GeneXpert MRSA Assay (FDA approved for NASAL specimens only), is one component of a comprehensive MRSA colonization surveillance program. It is not intended to diagnose MRSA infection nor to guide or monitor treatment for MRSA infections. Test performance is not FDA approved in patients less than 68 years old. Performed at [facility], [address] [place], [place] [address]      Time coordinating discharge: 35 minutes  SIGNED:   Shernell Saldierna D  Mason Sole, DO [facility] 02/11/2024, 10:13 AM  If 7PM-7AM, please contact night-coverage [email or web address]

## 2024-02-12 ENCOUNTER — Inpatient Hospital Stay

## 2024-02-12 ENCOUNTER — Telehealth: Payer: Self-pay | Admitting: *Deleted

## 2024-02-12 ENCOUNTER — Ambulatory Visit (HOSPITAL_COMMUNITY): Admission: RE | Admit: 2024-02-12 | Source: Ambulatory Visit

## 2024-02-12 ENCOUNTER — Other Ambulatory Visit

## 2024-02-12 NOTE — Transitions of Care (Post Inpatient/ED Visit) (Signed)
   02/12/2024  Name: Abigail Wagner MRN: [id number] DOB: May 09, 1951  Today's TOC FU Call Status: Today's TOC FU Call Status:: Successful TOC FU Call Completed TOC FU Call Complete Date: 02/12/24 Patient's Name and Date of Birth confirmed.  Transition Care Management Follow-up Telephone Call Date of Discharge: 02/11/24 [facility] Type of Discharge: Inpatient Admission Primary Inpatient Discharge Diagnosis:: Atrial Fibrillation,  Heart Failure  Items Reviewed: Telephone call to [facility] to verify orders have been received and pt will be seen at home by hospice, per [facility], they have orders and will be seeing patient this week (spouse requested to wait until Wednesday)   [facility]ecil[facility], BSN RN Care Manager/ Transition of Care [place]/ [facility] [phone]

## 2024-02-13 ENCOUNTER — Encounter: Payer: Self-pay | Admitting: Family Medicine

## 2024-02-13 ENCOUNTER — Other Ambulatory Visit: Payer: Self-pay | Admitting: Family Medicine

## 2024-02-13 NOTE — Telephone Encounter (Signed)
 Requested medication (s) are due for refill today - provider review   Requested medication (s) are on the active medication list -yes  Future visit scheduled -no  Last refill: 02/01/24 #60   Notes to clinic: non delegated Rx  Requested Prescriptions  Pending Prescriptions Disp Refills   oxyCODONE -acetaminophen  (PERCOCET) 7.5-325 MG tablet 60 tablet 0    Sig: Take 1 tablet by mouth every 6 (six) hours as needed for severe pain (pain score 7-10). This script should last 30 days.  60 per month     Not Delegated - Analgesics:  Opioid Agonist Combinations Failed - 02/13/2024  2:48 PM      Failed - This refill cannot be delegated      Failed - Urine Drug Screen completed in last 360 days      Failed - Valid encounter within last 3 months    Recent Outpatient Visits           3 weeks ago Hematoma of scalp, subsequent encounter   [facility] Cheril Cork, Cisco Crest, MD   1 month ago Visit for suture removal   [facility] Austine Lefort, MD   1 month ago Abnormal CT scan of lung   [place] Austine Lefort, MD   4 months ago Chronic diarrhea   [facility] Jenelle Mis, FNP   7 months ago Community acquired pneumonia, unspecified laterality   [place] [facility] Pickard, Cisco Crest, MD       Future Appointments             In 2 weeks Paulett Boros, MD [facility] - [facility] Dept Of [place]. [facility]   In 2 weeks Gerard Knight, MD [facility] at [facility] [place] [facility]               Requested Prescriptions  Pending Prescriptions Disp Refills   oxyCODONE -acetaminophen  (PERCOCET) 7.5-325 MG tablet 60 tablet 0    Sig: Take 1 tablet by mouth every 6 (six) hours as needed for severe pain (pain score 7-10). This script should last 30 days.  60 per month     Not Delegated - Analgesics:  Opioid Agonist  Combinations Failed - 02/13/2024  2:48 PM      Failed - This refill cannot be delegated      Failed - Urine Drug Screen completed in last 360 days      Failed - Valid encounter within last 3 months    Recent Outpatient Visits           3 weeks ago Hematoma of scalp, subsequent encounter   [place] [facility] Cheril Cork, Cisco Crest, MD   1 month ago Visit for suture removal   [facility] Austine Lefort, MD   1 month ago Abnormal CT scan of lung   [facility] Austine Lefort, MD   4 months ago Chronic diarrhea   [facility] Jenelle Mis, FNP   7 months ago Community acquired pneumonia, unspecified laterality    [facility] Pickard, Cisco Crest, MD       Future Appointments             In 2 weeks Paulett Boros, MD [facility]  Dept Of [place]. [facility]   In 2 weeks Londa Rival, Davida Espy, MD [facility] at [facility], [facility]

## 2024-02-13 NOTE — Telephone Encounter (Signed)
 Copied from CRM [id number]. Topic: Clinical - Medication Refill >> Feb 13, 2024 11:09 AM Cynthia K wrote: Medication: oxyCODONE -acetaminophen  (PERCOCET) 7.5-325 MG tablet   Has the patient contacted their pharmacy? Yes (Agent: If no, request that the patient contact the pharmacy for the refill. If patient does not wish to contact the pharmacy document the reason why and proceed with request.) (Agent: If yes, when and what did the pharmacy advise?) Pharmacy needs order to refill  This is the patient's preferred pharmacy:  [id number] [place], [place] - [address] AT [facility] [address] [place] [address] Phone: [phone] Fax: [phone]  Is this the correct pharmacy for this prescription? Yes If no, delete pharmacy and type the correct one.   Has the prescription been filled recently? No  Is the patient out of the medication? No - She will run out tomorrow  Has the patient been seen for an appointment in the last year OR does the patient have an upcoming appointment? Yes  Can we respond through MyChart? Yes  Agent: Please be advised that Rx refills may take up to 3 business days. We ask that you follow-up with your pharmacy.

## 2024-02-14 ENCOUNTER — Ambulatory Visit: Payer: Medicare Other

## 2024-02-14 MED ORDER — OXYCODONE-ACETAMINOPHEN 7.5-325 MG PO TABS: 1.0000 | ORAL_TABLET | Freq: Four times a day (QID) | ORAL | 0 refills | Status: DC | PRN

## 2024-02-19 ENCOUNTER — Telehealth: Payer: Self-pay | Admitting: *Deleted

## 2024-02-19 NOTE — Telephone Encounter (Signed)
 Patient's spouse returned call from office. Spoke with Ms. Bettenhausen about moving his wife's appt. Moved to 1115 on Friday so that her CT scan can be read prior to her appt.   Mr. Hendrix states he is not sure if his wife will make the CT scan appt. And her appt. On Friday. States that Rhanda is having a hard time keeping food down and is undecided about treatment plans. Mr. Olshefski states she is a DNR and she can't decide about Home Hospice.  Advised Mr. Szuch to call Dr. Milburn Aliment office about Lylianna's symptoms. Mr. Collier states that his wife has an appt. For port flush and labs on Thursday prior to her CT scan, but he will call Dr. Milburn Aliment office this afternoon and then let our office know what they have decided about keeping appt. For Friday.

## 2024-02-20 ENCOUNTER — Telehealth: Payer: Self-pay | Admitting: *Deleted

## 2024-02-20 ENCOUNTER — Inpatient Hospital Stay: Admitting: Licensed Clinical Social Worker

## 2024-02-20 NOTE — Telephone Encounter (Signed)
 Received call from husband, Abigail Wagner to advise that patient is not wishing to continue with future appointments with our office at this point.  No specifics reported.  Appointments cancelled per request.

## 2024-02-20 NOTE — Telephone Encounter (Signed)
 Abigail Wagner called stating the appointment with Dr.Tucker on Friday 5/30 needs to be cancelled.   Abigail Wagner is not doing well and will be transferring to the hospice home.   Appointment cancelled

## 2024-02-21 ENCOUNTER — Inpatient Hospital Stay

## 2024-02-21 ENCOUNTER — Ambulatory Visit (HOSPITAL_COMMUNITY)

## 2024-02-21 ENCOUNTER — Inpatient Hospital Stay: Admitting: Hematology

## 2024-02-22 ENCOUNTER — Ambulatory Visit: Admitting: Gynecologic Oncology

## 2024-02-26 ENCOUNTER — Ambulatory Visit: Admitting: Family Medicine

## 2024-02-26 ENCOUNTER — Encounter: Payer: Self-pay | Admitting: Family Medicine

## 2024-02-26 VITALS — BP 116/64 | HR 67 | Temp 97.6°F | Ht 66.0 in | Wt 107.0 lb

## 2024-02-26 DIAGNOSIS — I4891 Unspecified atrial fibrillation: Secondary | ICD-10-CM

## 2024-02-26 DIAGNOSIS — C762 Malignant neoplasm of abdomen: Secondary | ICD-10-CM

## 2024-02-26 LAB — CBC WITH DIFFERENTIAL/PLATELET
Absolute Lymphocytes: 1105 {cells}/uL (ref 850–3900)
Absolute Monocytes: 407 {cells}/uL (ref 200–950)
Basophils Absolute: 50 {cells}/uL (ref 0–200)
Basophils Relative: 1.4 %
Eosinophils Absolute: 108 {cells}/uL (ref 15–500)
Eosinophils Relative: 3 %
HCT: 31.9 % — ABNORMAL LOW (ref 35.0–45.0)
Hemoglobin: 9.7 g/dL — ABNORMAL LOW (ref 11.7–15.5)
MCH: 30.8 pg (ref 27.0–33.0)
MCHC: 30.4 g/dL — ABNORMAL LOW (ref 32.0–36.0)
MCV: 101.3 fL — ABNORMAL HIGH (ref 80.0–100.0)
MPV: 10.4 fL (ref 7.5–12.5)
Monocytes Relative: 11.3 %
Neutro Abs: 1930 {cells}/uL (ref 1500–7800)
Neutrophils Relative %: 53.6 %
Platelets: 302 10*3/uL (ref 140–400)
RBC: 3.15 10*6/uL — ABNORMAL LOW (ref 3.80–5.10)
RDW: 14.5 % (ref 11.0–15.0)
Total Lymphocyte: 30.7 %
WBC: 3.6 10*3/uL — ABNORMAL LOW (ref 3.8–10.8)

## 2024-02-26 LAB — COMPREHENSIVE METABOLIC PANEL WITH GFR
AG Ratio: 1.8 (calc) (ref 1.0–2.5)
ALT: 10 U/L (ref 6–29)
AST: 14 U/L (ref 10–35)
Albumin: 4 g/dL (ref 3.6–5.1)
Alkaline phosphatase (APISO): 42 U/L (ref 37–153)
BUN/Creatinine Ratio: 23 (calc) — ABNORMAL HIGH (ref 6–22)
BUN: 25 mg/dL (ref 7–25)
CO2: 30 mmol/L (ref 20–32)
Calcium: 9 mg/dL (ref 8.6–10.4)
Chloride: 102 mmol/L (ref 98–110)
Creat: 1.1 mg/dL — ABNORMAL HIGH (ref 0.60–1.00)
Globulin: 2.2 g/dL (ref 1.9–3.7)
Glucose, Bld: 77 mg/dL (ref 65–99)
Potassium: 4.6 mmol/L (ref 3.5–5.3)
Sodium: 139 mmol/L (ref 135–146)
Total Bilirubin: 0.2 mg/dL (ref 0.2–1.2)
Total Protein: 6.2 g/dL (ref 6.1–8.1)
eGFR: 53 mL/min/{1.73_m2} — ABNORMAL LOW (ref >=60)

## 2024-02-26 LAB — HEMOGLOBIN, FINGERSTICK: POC HEMOGLOBIN: 10.5 g/dL — ABNORMAL LOW (ref 12.0–15.0)

## 2024-02-26 MED ORDER — AMOXICILLIN-POT CLAVULANATE 875-125 MG PO TABS: 1.0000 | ORAL_TABLET | Freq: Two times a day (BID) | ORAL | 0 refills | Status: AC

## 2024-02-26 NOTE — Progress Notes (Signed)
 Subjective:    Patient ID: Abigail Wagner, female    DOB: 1950-12-07, 73 y.o.   MRN: [id number]  HPI  Patient is a 73 year old Caucasian female with a history of ovarian cancer stage III as well as anal cancer. March 2024, I performed a CT scan of the lungs to screen for lung cancer.  This showed a possible pneumonia.  She was treated with antibiotics appropriately however she never followed up or repeated the chest x-ray.  Repeat CT 12/24 showed persistent opacities in the lungs.  Given the failure of multiple antibiotics I recommended pulmonology consultation for possible bronchoscopy to get cultures and to evaluate for malignancy.  Saw pulmonology in February and they ordered PET scan due to concerns about BAC vs boop.  CEA and CA 125 were both elevated.    Pet scan (11/15/23): IMPRESSION: 1. Waxing and waning areas of consolidation in the lungs bilaterally with improvement in the right middle and right lower lobes, worsening in the lingula and stability in the left lower lobe. Associated hypermetabolism in the lingula. Findings may be due to an ongoing atypical or fungal infectious process. Consider follow-up CT chest without contrast in 3-4 weeks, after appropriate clinical therapy, as lingular malignancy cannot be definitively excluded. 2. Hypermetabolic peritoneal soft tissue thickening in the right anatomic pelvis, worrisome for peritoneal carcinomatosis related to ovarian cancer.  02/26/24 Since I last saw the patient, she saw just who is concerned about possible peritoneal carcinomatosis/metastasis.  Recommended CT scan of the abdomen and pelvis to evaluate further.  Also recommended possible biopsy to dictate treatment.  I do not see where the patient has scheduled the CT scan will go to the biopsy.  Fortunately she recently admitted the hospital with atrial fibrillation with RVR.  Upon discharge from the hospital, she went home on hospice.  She is here today for follow-up.  I have copied  the discharge summary below for reference:  Admit date: 02/06/2024   Discharge date: 02/11/2024   Admitted From:Home   Disposition:  Home   Recommendations for Outpatient Follow-up:  Follow up with PCP in 1-2 weeks Follow-up with home hospice agency Remain on amiodarone  as prescribed with 200 mg twice daily for 3 weeks and then 200 mg daily thereafter Lasix  40 mg daily and potassium 40 mill equivalents daily Toprol -XL 50 mg twice daily Continue other home medications as prior   Home Health: None   Equipment/Devices: None   Discharge Condition:Stable   CODE STATUS: DNR   Diet recommendation: Heart Healthy   Brief/Interim Summary: Abigail Wagner is a 73 y.o. female with medical history significant for paroxysmal atrial fibrillation on Eliquis , dyslipidemia, hypertension, previously treated anal cancer, recurrent ovarian cancer, chronic diastolic heart failure, carotid artery stenosis, and depression who presented to the ED with complaints of worsening lower extremity edema as well as depression and overwhelm.  Patient was admitted with symptomatic atrial fibrillation with RVR as well as acute heart failure symptoms.  She is being aggressively diuresed and heart rate control being managed by cardiology with use of IV amiodarone  infusion.  Her heart rates have shown significant improvement in control and she is transition to oral amiodarone  today as well as Toprol -XL twice daily.  Cardiology recommending transition to oral Lasix .  Patient is quite eager for discharge and has home hospice arrangements at this point.  No other acute events or concerns noted.   Discharge Diagnoses:  Principal Problem:   Atrial fibrillation with RVR (HCC) Active Problems:   Depression  History of ovarian cancer   Anal cancer (HCC)   Essential hypertension   Anxiety   Abdominal carcinomatosis (HCC)   Principal discharge diagnosis: Symptomatic atrial fibrillation with RVR along with acute diastolic CHF  exacerbation.  TODAY: Patient appears very frail and pale.  I was concerned that she was extremely anemic so I did perform a fingerstick hemoglobin.  Fortunately this was greater than 10 but is reassuring as she was not discharged from the hospital.  She does have a cough.  She denies any fevers.  Today she is in atrial fibrillation but her heart rate is controlled in the 80s.  On exam she has prominent crackles and rhonchi in the left lung.  There are no crackles or rhonchi heard in the right lung.  Last chest x-ray in the hospital did show a left pleural effusion and possible pneumonia in addition to pulmonary edema.  She denies any pleurisy.  She denies any hemoptysis.  She denies any shortness of breath.  We discussed her malignancy.  I explained that oncology recommended a CT scan of the abdomen and pelvis and a biopsy.  Essentially if she is not pursuing this she is electing to forego treatment.  I explained the implications of this including possible death.  Patient has declined hospice.  Therefore I wanted her to understand her choices.  She understands but at the present time she is not sure that she wants to go through a biopsy or any additional treatment   Past Medical History:  Diagnosis Date   Allergy    Anal carcinoma (HCC)    Chemo and radiation   Anemia    Anxiety 1974   Dr. [name] at [place] (psychiatrist).     Blood transfusion without reported diagnosis 2017   Carotid artery disease (HCC)    Chronic kidney disease    DDD (degenerative disc disease), lumbosacral    GERD (gastroesophageal reflux disease) 2010   Headache(784.0)    History of adenomatous polyp of colon 10/07/2015   Tubular adenoma high grade dysplasia   History of herpes genitalis    History of kidney stones    History of suicide attempt 2010   HTN (hypertension)    Hyperlipidemia    IBS (irritable bowel syndrome)    Major depression    OA (osteoarthritis)    Ovarian cancer (HCC)    Stage IIIB papillary  ovarian carcinoma  s/p omentectomy and BSO & chemotherapy (completed 12-07-2014)   Paroxysmal atrial fibrillation (HCC)    Prolapse of vaginal vault after hysterectomy 07/20/2015   Rectovaginal fistula 08/05/2015   Seasonal allergies    Sigmoid diverticulosis    Smokers' cough [facility])     Past Surgical History:  Procedure Laterality Date   BIOPSY  10/12/2020   Procedure: BIOPSY;  Surgeon: Urban Garden, MD;  [facility];  Service: Gastroenterology;;   BREAST ENHANCEMENT SURGERY  1986   BREAST SURGERY  1986   CESAREAN SECTION  1978   COLONOSCOPY N/A 10/07/2015   Procedure: COLONOSCOPY;  Surgeon: Ruby Corporal, MD;  [facility];  Service: Endoscopy;  Laterality: N/A;  7:30   COLONOSCOPY WITH PROPOFOL  N/A 10/12/2020   Procedure: COLONOSCOPY WITH PROPOFOL ;  Surgeon: Urban Garden, MD;  [facility];  Service: Gastroenterology;  Laterality: N/A;  9:00   COSMETIC SURGERY  1986   CYSTOSCOPY N/A 02/27/2022   Procedure: CYSTOSCOPY;  Surgeon: Marco Severs, MD;  Location: [facility];  Service: Urology;  Laterality: N/A;  CYSTOSCOPY WITH FULGERATION N/A 11/03/2021   Procedure: CYSTOSCOPY WITH FULGERATION;  Surgeon: Marco Severs, MD;  Location: [facility];  Service: Urology;  Laterality: N/A;   ESOPHAGOGASTRODUODENOSCOPY (EGD) WITH PROPOFOL  N/A 10/12/2020   Procedure: ESOPHAGOGASTRODUODENOSCOPY (EGD) WITH PROPOFOL ;  Surgeon: Urban Garden, MD;  [facility];  Service: Gastroenterology;  Laterality: N/A;   EXPLORATORY LAPAROTOMY/ OMENTECTOMY/  BILATERAL SALPINGOOPHORECTOMY/  PORT-A-CATH PLACEMENT  07-03-2014   [place]   KNEE ARTHROSCOPY Left 09/22/2004   LAPAROSCOPY N/A 06/02/2014   Procedure: DIAGNOSTIC LAPAROSCOPY, OMENTAL BIOPSY, RIGHT OVARY BIOPSY, LYSIS OF ADHESIONS;  Surgeon: Boyce Byes, MD;  Location: [facility];  Service: General;  Laterality: N/A;   MUCOSAL ADVANCEMENT FLAP N/A 06/15/2016   Procedure:  EXCISION RECTOVAGINAOL FISTULA WITH MUCOSAL ADVANCEMENT FLAP;  Surgeon: Joyce Nixon, MD;  Location: [facility];  Service: General;  Laterality: N/A;   PLACEMENT OF SETON N/A 09/09/2015   Procedure: PLACEMENT OF SETON;  Surgeon: Joyce Nixon, MD;  Location: [facility];  Service: General;  Laterality: N/A;   PORT-A-CATH REMOVAL Right 08/17/2014   Procedure: REMOVAL INTRAPERITONEAL CHEMO PORT;  Surgeon: Boyce Byes, MD;  Location: [facility];  Service: General;  Laterality: Right;   PORTACATH PLACEMENT Right 08/17/2014   Procedure:  PLACE NEW PORT A CATH;  Surgeon: Boyce Byes, MD;  Location: [facility];  Service: General;  Laterality: Right;   RECTAL BIOPSY N/A 09/09/2015   Procedure: BIOPSY OF RECTOVAGINAL MASS;  Surgeon: Joyce Nixon, MD;  Location: [facility];  Service: General;  Laterality: N/A;   TRANSURETHRAL RESECTION OF BLADDER TUMOR N/A 02/27/2022   Procedure: TRANSURETHRAL RESECTION OF BLADDER TUMOR (TURBT);  Surgeon: Marco Severs, MD;  Location: [facility];  Service: Urology;  Laterality: N/A;   TUBAL LIGATION  YRS AGO   VAGINAL HYSTERECTOMY  1981   fibroids   Current Outpatient Medications on File Prior to Visit  Medication Sig Dispense Refill   amiodarone  (PACERONE ) 200 MG tablet Take 1 tablet (200 mg total) by mouth 2 (two) times daily for 21 days. 42 tablet 0   [START ON 03/04/2024] amiodarone  (PACERONE ) 200 MG tablet Take 1 tablet (200 mg total) by mouth daily. 90 tablet 3   Aspirin-Salicylamide-Caffeine (BC HEADACHE POWDER PO) Take 1-2 Packages by mouth daily at 12 noon.     Cholecalciferol (VITAMIN D -3) 125 MCG (5000 UT) TABS Take 5,000 Units by mouth daily.     clotrimazole (LOTRIMIN) 1 % cream Apply 1 application topically 2 (two) times daily as needed (itching around ankles).     Coenzyme Q10 (CO Q 10) 100 MG CAPS Take 100 mg by mouth daily.     colestipol  (COLESTID ) 1 g tablet Take 2  tablets (2 g total) by mouth daily. 180 tablet 1   divalproex  (DEPAKOTE  ER) 500 MG 24 hr tablet Take 1 tablet (500 mg total) by mouth daily. 90 tablet 3   ELIQUIS  5 MG TABS tablet TAKE 1 TABLET BY MOUTH TWICE A DAY 180 tablet 1   escitalopram  (LEXAPRO ) 20 MG tablet Take 1 tablet (20 mg total) by mouth daily. 90 tablet 2   estradiol  (ESTRACE ) 0.1 MG/GM vaginal cream Apply a pea size amount of cream to urethral area of vagina twice weekly 42.5 g 12   Ferrous Gluconate (IRON  27 PO) Take 65 mg by mouth daily. Pt is taking 65mg /325 daily     furosemide  (LASIX ) 40 MG tablet Take 1 tablet (40 mg total) by mouth daily.  30 tablet 11   lipase/protease/amylase (CREON ) 36000 UNITS CPEP capsule Take 2 capsules (72,000 Units total) by mouth 3 (three) times daily before meals. Two capsules with meals and one with snacks. 200 capsule 1   LORazepam  (ATIVAN ) 0.5 MG tablet Take 1 tablet (0.5 mg total) by mouth 3 (three) times daily as needed for anxiety. 90 tablet 3   metoprolol  succinate (TOPROL -XL) 50 MG 24 hr tablet Take 1 tablet (50 mg total) by mouth 2 (two) times daily. Take with or immediately following a meal. 60 tablet 1   Multiple Vitamin (MULTIVITAMIN) tablet Take 1 tablet by mouth daily.     Multiple Vitamins-Minerals (HAIR SKIN & NAILS ADVANCED PO) Take 1 tablet by mouth 3 (three) times daily.     omeprazole  (PRILOSEC) 40 MG capsule TAKE 1 CAPSULE (40 MG TOTAL) BY MOUTH DAILY. 90 capsule 0   OVER THE COUNTER MEDICATION LYSENE 1 daily per husband     oxyCODONE -acetaminophen  (PERCOCET) 7.5-325 MG tablet Take 1 tablet by mouth every 6 (six) hours as needed for severe pain (pain score 7-10). This script should last 30 days.  60 per month 60 tablet 0   potassium chloride  SA (KLOR-CON  M) 20 MEQ tablet Take 2 tablets (40 mEq total) by mouth daily. 60 tablet 1   rosuvastatin  (CRESTOR ) 20 MG tablet TAKE 1 TABLET BY MOUTH EVERY DAY 90 tablet 3   No current facility-administered medications on file prior to visit.    Allergies  Allergen Reactions   Morphine  And Codeine     Delirium with sbo   Social History   Socioeconomic History   Marital status: Married    Spouse name: Blaise Bumps   Number of children: 4   Years of education: 12   Highest education level: 12th grade  Occupational History   Occupation: retire    Comment: [employer]  Tobacco Use   Smoking status: Former    Current packs/day: 0.00    Average packs/day: 0.5 packs/day for 15.0 years (7.5 ttl pk-yrs)    Types: Cigarettes    Start date: 02/17/2003    Quit date: 02/16/2018    Years since quitting: 6.0   Smokeless tobacco: Never   Tobacco comments:    half pack a day for 15 yrs  Vaping Use   Vaping status: Never Used  Substance and Sexual Activity   Alcohol use: No   Drug use: No   Sexual activity: Not Currently    Birth control/protection: Surgical    Comment: hyst  Other Topics Concern   Not on file  Social History Narrative   Lives at home with Blaise Bumps   Retired [employer]   Married 02/16/1990   Several grandchildren    Social Drivers of Health   Financial Resource Strain: Low Risk  (09/07/2023)   Overall Financial Resource Strain (CARDIA)    Difficulty of Paying Living Expenses: Not hard at all  Food Insecurity: No Food Insecurity (02/06/2024)   Hunger Vital Sign    Worried About Running Out of Food in the Last Year: Never true    Ran Out of Food in the Last Year: Never true  Transportation Needs: No Transportation Needs (02/06/2024)   PRAPARE - Administrator, Civil Service (Medical): No    Lack of Transportation (Non-Medical): No  Physical Activity: Inactive (09/07/2023)   Exercise Vital Sign    Days of Exercise per Week: 0 days    Minutes of Exercise per Session: 20 min  Stress: Stress Concern Present (  09/07/2023)   [employer] [employer] - Occupational Stress Questionnaire    Feeling of Stress : To some extent  Social Connections: Moderately Integrated (02/06/2024)   Social  Connection and Isolation Panel [NHANES]    Frequency of Communication with Friends and Family: More than three times a week    Frequency of Social Gatherings with Friends and Family: Once a week    Attends Religious Services: 1 to 4 times per year    Active Member of [employer] or Organizations: No    Attends Banker Meetings: Never    Marital Status: Married  Catering manager Violence: Not At Risk (02/06/2024)   Humiliation, Afraid, Rape, and Kick questionnaire    Fear of Current or Ex-Partner: No    Emotionally Abused: No    Physically Abused: No    Sexually Abused: No   Family History  Problem Relation Age of Onset   Bipolar disorder Mother    Anxiety disorder Mother    Dementia Mother    Depression Mother    Mental illness Mother    Vision loss Mother    Alzheimer's disease Mother    Alcohol abuse Paternal Uncle    Bipolar disorder Maternal Grandmother    Dementia Maternal Grandmother    Alzheimer's disease Maternal Grandmother    Alcohol abuse Paternal Uncle    Stroke Brother    Deep vein thrombosis Son    Heart disease Father    Hyperlipidemia Father    Hypertension Father    Stroke Father    Vision loss Father    Atrial fibrillation Sister    Heart disease Brother    Heart disease Brother    ADD / ADHD Neg Hx    Drug abuse Neg Hx    OCD Neg Hx    Paranoid behavior Neg Hx    Schizophrenia Neg Hx    Seizures Neg Hx    Sexual abuse Neg Hx    Physical abuse Neg Hx       Review of Systems     Objective:   Physical Exam Vitals reviewed.  Constitutional:      General: She is not in acute distress.    Appearance: She is ill-appearing. She is not toxic-appearing or diaphoretic.  Cardiovascular:     Rate and Rhythm: Normal rate and regular rhythm.     Heart sounds: Normal heart sounds. No murmur heard.    No friction rub. No gallop.  Pulmonary:     Effort: Pulmonary effort is normal. No respiratory distress.     Breath sounds: No stridor. Rhonchi  and rales present. No wheezing.    Abdominal:     General: Abdomen is flat. Bowel sounds are normal. There is no distension.     Palpations: Abdomen is soft. There is no mass.     Tenderness: There is no abdominal tenderness.  Musculoskeletal:     Right upper leg: No deformity.     Left upper leg: No deformity.     Right lower leg: No edema.     Left lower leg: No edema.  Neurological:     Mental Status: She is alert.         Assessment & Plan:  Atrial fibrillation, unspecified type (HCC) - Plan: CBC with Differential/Platelet, Comprehensive metabolic panel with GFR, Hemoglobin, fingerstick  Abdominal carcinomatosis (HCC) Patient appears extremely frail and cachectic.  However she does not appear fluid overloaded on exam today.  Her heart rate is controlled and she  is appropriately anticoagulated.  I will check a CMP to monitor potassium and renal function.  Continue her current dose of Lasix  and potassium.  Patient declines referral for biopsy or CT scan of the abdomen and pelvis at the present time.  She understands her decisions but she is not sure that she wants to go through any additional treatment.  She will call me if she changes her mind.  I will treat the patient for possible pneumonia given the abnormal breath sounds.  Begin Augmentin  875 mg twice daily.  Recheck in 1 week or sooner if worse

## 2024-02-28 ENCOUNTER — Inpatient Hospital Stay: Admitting: Hematology

## 2024-02-28 ENCOUNTER — Ambulatory Visit: Payer: Self-pay | Admitting: Family Medicine

## 2024-02-29 ENCOUNTER — Ambulatory Visit: Admitting: Cardiology

## 2024-03-10 ENCOUNTER — Other Ambulatory Visit (INDEPENDENT_AMBULATORY_CARE_PROVIDER_SITE_OTHER): Payer: Self-pay | Admitting: Gastroenterology

## 2024-03-10 ENCOUNTER — Inpatient Hospital Stay: Admitting: Dietician

## 2024-03-10 DIAGNOSIS — K8681 Exocrine pancreatic insufficiency: Secondary | ICD-10-CM

## 2024-03-17 ENCOUNTER — Other Ambulatory Visit: Payer: Self-pay

## 2024-03-17 ENCOUNTER — Telehealth: Payer: Self-pay

## 2024-03-17 MED ORDER — OMEPRAZOLE 40 MG PO CPDR: 40.0000 mg | DELAYED_RELEASE_CAPSULE | Freq: Every day | ORAL | 0 refills | Status: DC

## 2024-03-17 NOTE — Telephone Encounter (Signed)
 Prescription Request  03/17/2024  LOV: 02/26/24  What is the name of the medication or equipment? omeprazole  (PRILOSEC) 40 MG capsule [[id number]]   Have you contacted your pharmacy to request a refill? Yes   Which pharmacy would you like this sent to?  [id number] [place], [place] - [address] AT [facility] [address] [place] [address] Phone: [phone] Fax: [phone]    Patient notified that their request is being sent to the clinical staff for review and that they should receive a response within 2 business days.   Please advise at [facility] [phone]

## 2024-03-22 ENCOUNTER — Other Ambulatory Visit (INDEPENDENT_AMBULATORY_CARE_PROVIDER_SITE_OTHER): Payer: Self-pay | Admitting: Gastroenterology

## 2024-03-22 DIAGNOSIS — K529 Noninfective gastroenteritis and colitis, unspecified: Secondary | ICD-10-CM

## 2024-03-24 ENCOUNTER — Telehealth: Payer: Self-pay

## 2024-03-24 NOTE — Telephone Encounter (Signed)
 [facility], he was calling about pt oxycodone , he wanted to know if you were aware that pt is also getting lorazepam  rx'd by another provider? I informed him that you were out of office and that there might be a delay in response.

## 2024-03-25 NOTE — Telephone Encounter (Signed)
 Spoke with Abigail Wagner and gave him the information.

## 2024-04-03 ENCOUNTER — Other Ambulatory Visit (HOSPITAL_COMMUNITY): Payer: Self-pay | Admitting: Psychiatry

## 2024-04-03 NOTE — Telephone Encounter (Signed)
 Call for appt

## 2024-04-09 ENCOUNTER — Ambulatory Visit: Admitting: Internal Medicine

## 2024-04-09 ENCOUNTER — Encounter: Payer: Self-pay | Admitting: Internal Medicine

## 2024-04-09 ENCOUNTER — Telehealth: Payer: Self-pay | Admitting: Family Medicine

## 2024-04-09 ENCOUNTER — Other Ambulatory Visit: Payer: Self-pay

## 2024-04-09 NOTE — Progress Notes (Deleted)
 Abigail Wagner, female    DOB: [birth year]    MRN: [id number]   Brief patient profile:  55  yowf  active smoker  referred to pulmonary clinic in [place]  10/31/2023 by Dr Duanne with persistent AS Disease   since march 2024 in setting of ovarian Ca s/p resection [facility] and then chemo in [place] and and radiation at [facility]   Last oncololgy note  PRIOR THERAPY:  1. Debulking surgery with BSO on 07/04/2014. 2. Chemoradiation therapy with 5-FU and mitomycin  completed on 12/06/2015.   NGS Results: Not done   CURRENT THERAPY: Surveillance   BRIEF ONCOLOGIC HISTORY:     Oncology History  History of ovarian cancer  06/02/2014 Procedure    Diagnostic laparoscopy, lysis of adhesions, biopsy of omental nodules, biopsy right ovary by Dr Gail    06/04/2014 Pathology Results    Diagnosis 1. Omentum, biopsy, Anterior peritoneal & omental nodule - SEROUS CARCINOMA WITH ASSOCIATED ABUNDANT PSAMMOMA BODIES AND DESMOPLASTIC STROMAL REACTION CONSISTENT WITH INVASIVE IMPLANTS. - SEE COMMENT. 2. Omentum, biopsy, Omental nodule - SEROUS CARCINOMA WITH ASSOCIATED ABUNDANT PSAMMOMA BODIES AND DESMOPLASTIC STROMAL REACTION CONSISTENT WITH INVASIVE IMPLANTS. - SEE COMMENT. 3. Ovary, biopsy/wedge resection, Right ovary - SMALL FRAGMENTS OF ATYPICAL PAPILLARY PROLIFERATION WITH PSAMMOMA BODIES CONSISTENT WITH AT LEAST SEROUS BORDERLINE TUMOR.    07/03/2014 Procedure    Exploratory laparotomy, omentectomy, Bilateral salpingo-oophorectomy, inptraperitoneal PORT placement by Dr. Dodie            07/24/2014 - 12/07/2014 Chemotherapy    Carboplatin/Paclitaxel x 6 cycles      08/17/2014 Procedure    Insertion of 8 French power port clear view tunneled venous vascular access device next line use of fluoroscopy for guidance and positioning Use of ultrasound for venipuncture Removal of intraperitoneal chemotherapy port By Dr. Gail    05/14/2017 Genetic Testing    Negative genetic testing on the [employer]  panel.  The [facility] gene panel offered by [employer] includes sequencing and deletion/duplication testing of the following 28 genes: APC, ATM, BARD1, BMPR1A, BRCA1, BRCA2, BRIP1, CHD1, CDK4, CDKN2A, CHEK2, EPCAM (large rearrangement only), MLH1, MSH2, MSH6, MUTYH, NBN, PALB2, PMS2, PTEN, RAD51C, RAD51D, SMAD4, STK11, and TP53. Sequencing was performed for select regions of POLE and POLD1, and large rearrangement analysis was performed for select regions of GREM1. The report date is April 27, 2017.   HRD testing was performed on the ovarian tumor.  The tumor was negative for any deleterious BRCA1 or BRCA2 mutations.    Genomic instability status was not interpretable.  Therefore, [facility] was unable to analyze the ovarian tumor for genomic instability.  The report date is May 14, 2017.      Anal cancer (HCC)  09/09/2015 Procedure    Rectovaginal septal mass biopsy by Dr. Debby    09/10/2015 Pathology Results    Soft tissue mass, biopsy, rectovaginal septal mass - SQUAMOUS CELL CARCINOMA.    10/15/2015 PET scan    1. Focal hypermetabolism with associated ill-defined soft tissue fullness at the junction of the anterior anal wall and posterior inferior vaginal wall, in keeping with the provided history of a primary anal carcinoma. 2. No hypermetabolic locoregional or distant metastatic disease. 3. Status post hysterectomy, with no abnormal findings at the vaginal cuff. No evidence of hypermetabolic peritoneal tumor. 4. Tiny nonobstructing bilateral renal stones.    10/25/2015 - 12/06/2015 Radiation Therapy    [place], [place]    10/25/2015 - 12/06/2015 Chemotherapy    Mitomycin  C and 5FU,  by Dr. Lyndol      06/15/2016 Procedure    EXCISION RECTOVAGINAOL FISTULA WITH MUCOSAL ADVANCEMENT FLAP by Dr. Debby    06/18/2016 Pathology Results    Fistula, rectovaginal SQUAMOUS, COLUMNAR JUNCTION MUCOSA AND SUBCUTANOUS SOFT TISSUE WITH FISTULA NO EVIDENCE OF MALIGNANCY     09/20/2016 Imaging    CT CAP- 1. No acute findings and no evidence for recurrent tumor or metastatic disease. 2. Aortic atherosclerosis and coronary artery calcification        CANCER STAGING: Cancer Staging Anal cancer (HCC) Staging form: Anus, AJCC 8th Edition - Clinical: Stage IIA (cT2, cN0, cM0) - Signed by Berry Debby RAMAN, PA-C on 09/27/2016      History of Present Illness  10/31/2023  Pulmonary/ 1st office eval/ [facility] / [place] [facility]  Chief Complaint  Patient presents with   Consult  Dyspnea:  more dizzy than sob when walking no longer shopping  Cough: yellowish minimal in am/ never hemoptysis  Sleep: bed is flat / L side down always one pillow  SABA use: none 02: none  Rec Please remember to go to the lab department   for your tests - we will call you with the results when they are available. My office will be contacting you by phone for referral  PET scanning > done 11/15/23 and inconclusive - some areas improved, lingula worse Please call in meantime if respiratory condition deteriorates    01/02/2024  f/u ov/[place] office/Jannine Abreu re: pulmonary infiltrates/ still smoking  maint on amiodarone  x years   Chief Complaint  Patient presents with   Follow-up    Discuss results of Ct scan   Dyspnea:  limited by strength more than breathing - husband says waddle walking but still working in yard pulling weeds on 12/29/23 x 2 hours  Cough: some first thing in am > all clear mucus Sleeping: flat bed L side down x 40 years  s resp cc  SABA use: none  02: none  Rec The key is to stop smoking completely before smoking completely stops you!  Please schedule a follow up visit in 3 months but call sooner if needed      04/09/2024  f/u ov/[place] office/Marquell Saenz re: pulmonary infiltrates maint on ***  No chief complaint on file.   Dyspnea:  *** Cough: *** Sleeping: ***   resp cc  SABA use: *** 02: ***  Lung cancer screening: ***   No obvious day to day or daytime  variability or assoc excess/ purulent sputum or mucus plugs or hemoptysis or cp or chest tightness, subjective wheeze or overt sinus or hb symptoms.    Also denies any obvious fluctuation of symptoms with weather or environmental changes or other aggravating or alleviating factors except as outlined above   No unusual exposure hx or h/o childhood pna/ asthma or knowledge of premature birth.  Current Allergies, Complete Past Medical History, Past Surgical History, Family History, and Social History were reviewed in [employer] record.  ROS  The following are not active complaints unless bolded Hoarseness, sore throat, dysphagia, dental problems, itching, sneezing,  nasal congestion or discharge of excess mucus or purulent secretions, ear ache,   fever, chills, sweats, unintended wt loss or wt gain, classically pleuritic or exertional cp,  orthopnea pnd or arm/hand swelling  or leg swelling, presyncope, palpitations, abdominal pain, anorexia, nausea, vomiting, diarrhea  or change in bowel habits or change in bladder habits, change in stools or change in urine, dysuria, hematuria,  rash, arthralgias,  visual complaints, headache, numbness, weakness or ataxia or problems with walking or coordination,  change in mood or  memory.        No outpatient medications have been marked as taking for the 04/09/24 encounter (Appointment) with Darlean Ozell NOVAK, MD.               Past Medical History:  Diagnosis Date   Allergy    Anal carcinoma (HCC)    Chemo and radiation   Anemia    Anxiety    Dr. 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     Blood transfusion without reported diagnosis 2017   Carotid artery disease (HCC)    Chronic kidney disease    DDD (degenerative disc disease), lumbosacral    GERD (gastroesophageal reflux disease)    Headache(784.0)    History of adenomatous polyp of colon 10/07/2015   Tubular adenoma high grade dysplasia   History of herpes genitalis    History  of kidney stones    History of suicide attempt 2010   HTN (hypertension)    Hyperlipidemia    IBS (irritable bowel syndrome)    Major depression    OA (osteoarthritis)    Ovarian cancer (HCC)    Stage IIIB papillary ovarian carcinoma  s/p omentectomy and BSO & chemotherapy (completed 12-07-2014)   Paroxysmal atrial fibrillation (HCC)    Prolapse of vaginal vault after hysterectomy 07/20/2015   Rectovaginal fistula 08/05/2015   Seasonal allergies    Sigmoid diverticulosis    Smokers' cough (HCC)       Objective:     wts  04/09/2024         ***  01/02/2024          114  10/31/23 104 lb 9.6 oz (47.4 kg)  10/04/23 103 lb 12.8 oz (47.1 kg)  09/18/23 105 lb (47.6 kg)   10/30/22             118   Vital signs reviewed  04/09/2024  - Note at rest 02 sats  ***% on ***   General appearance:    ***       few insp squeaks L base no bronchial BS ***      CXR PA and Lateral:   01/02/2024 :    I personally reviewed images and impression is as follows:     Compared to prior PET-CT and chest CT there is persistent though improved multifocal infection. Surveillance until resolution is recommended.    Assessment

## 2024-04-09 NOTE — Telephone Encounter (Signed)
 Prescription Request  04/09/2024  LOV: 02/26/2024  What is the name of the medication or equipment?   rosuvastatin  (CRESTOR ) 20 MG tablet  **90 day script requested**  Have you contacted your pharmacy to request a refill? Yes   Which pharmacy would you like this sent to?  [id number] - [place], [place] - [address] AT [facility] [address]  [place] [address] Phone: [phone] Fax: [phone]    Patient notified that their request is being sent to the clinical staff for review and that they should receive a response within 2 business days.   Please advise pharmacist.

## 2024-04-10 ENCOUNTER — Other Ambulatory Visit: Payer: Self-pay | Admitting: Family Medicine

## 2024-04-10 ENCOUNTER — Encounter: Payer: Self-pay | Admitting: Family Medicine

## 2024-04-10 MED ORDER — METOPROLOL SUCCINATE ER 50 MG PO TB24: 50.0000 mg | ORAL_TABLET | Freq: Two times a day (BID) | ORAL | 1 refills | Status: DC

## 2024-04-14 ENCOUNTER — Encounter: Payer: Self-pay | Admitting: Family Medicine

## 2024-04-14 ENCOUNTER — Other Ambulatory Visit: Payer: Self-pay

## 2024-04-14 DIAGNOSIS — E7800 Pure hypercholesterolemia, unspecified: Secondary | ICD-10-CM

## 2024-04-14 MED ORDER — ROSUVASTATIN CALCIUM 20 MG PO TABS: 20.0000 mg | ORAL_TABLET | Freq: Every day | ORAL | 3 refills | Status: AC

## 2024-04-29 ENCOUNTER — Encounter: Payer: Self-pay | Admitting: Family Medicine

## 2024-04-29 ENCOUNTER — Other Ambulatory Visit: Payer: Self-pay

## 2024-04-29 MED ORDER — OXYCODONE-ACETAMINOPHEN 7.5-325 MG PO TABS: 1.0000 | ORAL_TABLET | Freq: Four times a day (QID) | ORAL | 0 refills | Status: DC | PRN

## 2024-05-04 ENCOUNTER — Other Ambulatory Visit: Payer: Self-pay | Admitting: Family Medicine

## 2024-05-18 ENCOUNTER — Encounter: Payer: Self-pay | Admitting: Family Medicine

## 2024-05-19 ENCOUNTER — Other Ambulatory Visit: Payer: Self-pay

## 2024-05-19 DIAGNOSIS — R630 Anorexia: Secondary | ICD-10-CM

## 2024-05-19 DIAGNOSIS — Z923 Personal history of irradiation: Secondary | ICD-10-CM

## 2024-05-19 DIAGNOSIS — E876 Hypokalemia: Secondary | ICD-10-CM

## 2024-05-19 DIAGNOSIS — C762 Malignant neoplasm of abdomen: Secondary | ICD-10-CM

## 2024-05-19 MED ORDER — POTASSIUM CHLORIDE CRYS ER 20 MEQ PO TBCR: 40.0000 meq | EXTENDED_RELEASE_TABLET | Freq: Every day | ORAL | 1 refills | Status: AC

## 2024-05-26 ENCOUNTER — Other Ambulatory Visit (INDEPENDENT_AMBULATORY_CARE_PROVIDER_SITE_OTHER): Payer: Self-pay | Admitting: Gastroenterology

## 2024-05-26 DIAGNOSIS — K8681 Exocrine pancreatic insufficiency: Secondary | ICD-10-CM

## 2024-05-30 ENCOUNTER — Telehealth: Payer: Self-pay

## 2024-05-30 NOTE — Telephone Encounter (Signed)
 Created in error

## 2024-06-02 ENCOUNTER — Ambulatory Visit: Attending: Cardiology | Admitting: Cardiology

## 2024-06-02 VITALS — BP 124/80 | HR 58 | Ht 66.0 in | Wt 105.2 lb

## 2024-06-02 DIAGNOSIS — I6523 Occlusion and stenosis of bilateral carotid arteries: Secondary | ICD-10-CM | POA: Diagnosis not present

## 2024-06-02 DIAGNOSIS — I480 Paroxysmal atrial fibrillation: Secondary | ICD-10-CM

## 2024-06-02 NOTE — Progress Notes (Signed)
 Cardiology Office Note  Date: 06/02/2024   ID: Abigail Wagner, DOB 08-18-1951, MRN [id number]  History of Present Illness: Abigail Wagner is a 73 y.o. female last seen in April 2024.  She was hospitalized in May, discharge summary reviewed.  She is here today with her husband for a follow-up visit.  Reports having an episode of apparent atrial fibrillation that lasted about 2 days although heart rate was around 100 and this spontaneously resolved on medical therapy.  This is the only obvious event since May.  Medications reviewed.  She continues on Eliquis  along with amiodarone  and Toprol -XL.  Resting heart rate around 60 when she is in sinus rhythm.  No obvious symptomatic bradycardia per discussion today.  I did review her interval lab work.  Physical Exam: VS:  BP 124/80 (BP Location: Right Arm, Cuff Size: Small)   Pulse (!) 58   Ht 5' 6 (1.676 m)   Wt 105 lb 3.2 oz (47.7 kg)   SpO2 100%   BMI 16.98 kg/m , BMI Body mass index is 16.98 kg/m.  Wt Readings from Last 3 Encounters:  06/02/24 105 lb 3.2 oz (47.7 kg)  02/26/24 107 lb (48.5 kg)  02/10/24 112 lb 7 oz (51 kg)    General: Patient appears comfortable at rest.  Cachectic appearing. HEENT: Conjunctiva and lids normal. Neck: Supple, no elevated JVP or carotid bruits. Lungs: Clear to auscultation, nonlabored breathing at rest. Cardiac: Regular rate and rhythm, no S3 or significant systolic murmur. Extremities: No pitting edema.  ECG:  An ECG dated 02/06/2024 was personally reviewed today and demonstrated:  Atrial fibrillation with RVR, decreased R wave progression, nonspecific ST changes.  Labwork: 02/06/2024: B Natriuretic Peptide 1,408.0 02/07/2024: TSH 6.385 02/11/2024: Magnesium  1.8 02/26/2024: ALT 10; AST 14; BUN 25; Creat 1.10; Hemoglobin 9.7; Platelets 302; Potassium 4.6; Sodium 139     Component Value Date/Time   CHOL 132 10/30/2022 1008   TRIG 97 10/30/2022 1008   HDL 65 10/30/2022 1008   CHOLHDL 2.0 10/30/2022 1008    VLDL 15 06/29/2021 0519   LDLCALC 49 10/30/2022 1008   Other Studies Reviewed Today:  Echocardiogram 02/07/2024:  1. Left ventricular ejection fraction, by estimation, is 50%. The left  ventricle has low normal function. The left ventricle has no regional wall  motion abnormalities. There is mild left ventricular hypertrophy. Left  ventricular diastolic parameters are   indeterminate.   2. Right ventricular systolic function is low normal. The right  ventricular size is mildly enlarged. There is moderately elevated  pulmonary artery systolic pressure.   3. Left atrial size was severely dilated.   4. Right atrial size was mildly dilated.   5. Moderate pleural effusion in the left lateral region.   6. The mitral valve is abnormal. Moderate mitral valve regurgitation. No  evidence of mitral stenosis.   7. The tricuspid valve is abnormal. Tricuspid valve regurgitation is mild  to moderate.   8. Continous wave Doppler of the aortic valve is incomplete. Grossly  there is no significant aortic stenosis. . The aortic valve was not well  visualized. There is mild calcification of the aortic valve. There is mild  thickening of the aortic valve.  Aortic valve regurgitation is mild.   9. The inferior vena cava is dilated in size with <50% respiratory  variability, suggesting right atrial pressure of 15 mmHg.   Assessment and Plan:  1.  Paroxysmal atrial fibrillation with CHA2DS2-VASc score of 4.  Heart rate regular today.  Did have potential breakthrough episode lasting about 2 days although generally tolerating current regimen including Eliquis  5 mg twice daily, amiodarone  200 mg daily, and Toprol -XL 50 mg daily.  Continue to track vital signs at home.  She does not report any spontaneous bleeding problems.  2.  Asymptomatic carotid artery disease, follow-up carotid Dopplers in October 2024 revealed no hemodynamically significant atherosclerosis involving the RICA with 50 to 69% LICA  stenosis.  Continue Crestor  20 mg daily.  Disposition:  Follow up 6 months.  Signed, Jayson JUDITHANN Sierras, M.D., F.A.C.C. [facility] at [facility]

## 2024-06-02 NOTE — Patient Instructions (Signed)
 Medication Instructions:  Your physician recommends that you continue on your current medications as directed. Please refer to the Current Medication list given to you today.   Labwork: None today  Testing/Procedures: None today  Follow-Up: 6 months  Any Other Special Instructions Will Be Listed Below (If Applicable).  If you need a refill on your cardiac medications before your next appointment, please call your pharmacy.

## 2024-06-06 ENCOUNTER — Ambulatory Visit: Admitting: Family Medicine

## 2024-06-06 VITALS — BP 110/62 | HR 72 | Temp 98.2°F | Ht 66.0 in | Wt 104.0 lb

## 2024-06-06 DIAGNOSIS — C762 Malignant neoplasm of abdomen: Secondary | ICD-10-CM

## 2024-06-06 DIAGNOSIS — I4891 Unspecified atrial fibrillation: Secondary | ICD-10-CM | POA: Diagnosis not present

## 2024-06-06 LAB — COMPREHENSIVE METABOLIC PANEL WITH GFR
AG Ratio: 1.1 (calc) (ref 1.0–2.5)
ALT: 10 U/L (ref 6–29)
AST: 16 U/L (ref 10–35)
Albumin: 3.4 g/dL — ABNORMAL LOW (ref 3.6–5.1)
Alkaline phosphatase (APISO): 88 U/L (ref 37–153)
BUN/Creatinine Ratio: 26 (calc) — ABNORMAL HIGH (ref 6–22)
BUN: 26 mg/dL — ABNORMAL HIGH (ref 7–25)
CO2: 25 mmol/L (ref 20–32)
Calcium: 8.9 mg/dL (ref 8.6–10.4)
Chloride: 105 mmol/L (ref 98–110)
Creat: 0.99 mg/dL (ref 0.60–1.00)
Globulin: 3.1 g/dL (ref 1.9–3.7)
Glucose, Bld: 85 mg/dL (ref 65–99)
Potassium: 4.5 mmol/L (ref 3.5–5.3)
Sodium: 141 mmol/L (ref 135–146)
Total Bilirubin: 0.2 mg/dL (ref 0.2–1.2)
Total Protein: 6.5 g/dL (ref 6.1–8.1)
eGFR: 60 mL/min/1.73m2 (ref >=60)

## 2024-06-06 LAB — CBC WITH DIFFERENTIAL/PLATELET
Absolute Lymphocytes: 1066 {cells}/uL (ref 850–3900)
Absolute Monocytes: 547 {cells}/uL (ref 200–950)
Basophils Absolute: 50 {cells}/uL (ref 0–200)
Basophils Relative: 0.7 %
Eosinophils Absolute: 130 {cells}/uL (ref 15–500)
Eosinophils Relative: 1.8 %
HCT: 28.1 % — ABNORMAL LOW (ref 35.0–45.0)
Hemoglobin: 8.8 g/dL — ABNORMAL LOW (ref 11.7–15.5)
MCH: 28.9 pg (ref 27.0–33.0)
MCHC: 31.3 g/dL — ABNORMAL LOW (ref 32.0–36.0)
MCV: 92.4 fL (ref 80.0–100.0)
MPV: 10.2 fL (ref 7.5–12.5)
Monocytes Relative: 7.6 %
Neutro Abs: 5407 {cells}/uL (ref 1500–7800)
Neutrophils Relative %: 75.1 %
Platelets: 361 Thousand/uL (ref 140–400)
RBC: 3.04 Million/uL — ABNORMAL LOW (ref 3.80–5.10)
RDW: 16.2 % — ABNORMAL HIGH (ref 11.0–15.0)
Total Lymphocyte: 14.8 %
WBC: 7.2 Thousand/uL (ref 3.8–10.8)

## 2024-06-06 MED ORDER — OXYCODONE-ACETAMINOPHEN 7.5-325 MG PO TABS: 1.0000 | ORAL_TABLET | Freq: Three times a day (TID) | ORAL | 0 refills | Status: DC | PRN

## 2024-06-06 MED ORDER — OXYCODONE-ACETAMINOPHEN 7.5-325 MG PO TABS: 1.0000 | ORAL_TABLET | Freq: Four times a day (QID) | ORAL | 0 refills | Status: DC | PRN

## 2024-06-06 NOTE — Progress Notes (Signed)
 Subjective:    Patient ID: Abigail Wagner, female    DOB: [birth year], 73 y.o.   MRN: [id number]  HPI  Patient is a 73 year old Caucasian female with a history of ovarian cancer stage III as well as anal cancer. March 2024, I performed a CT scan of the lungs to screen for lung cancer.  This showed a possible pneumonia.  She was treated with antibiotics appropriately however she never followed up or repeated the chest x-ray.  Repeat CT 12/24 showed persistent opacities in the lungs.  Given the failure of multiple antibiotics I recommended pulmonology consultation for possible bronchoscopy to get cultures and to evaluate for malignancy.  Saw pulmonology in February and they ordered PET scan due to concerns about BAC vs boop.  CEA and CA 125 were both elevated.    Pet scan (11/15/23): IMPRESSION: 1. Waxing and waning areas of consolidation in the lungs bilaterally with improvement in the right middle and right lower lobes, worsening in the lingula and stability in the left lower lobe. Associated hypermetabolism in the lingula. Findings may be due to an ongoing atypical or fungal infectious process. Consider follow-up CT chest without contrast in 3-4 weeks, after appropriate clinical therapy, as lingular malignancy cannot be definitively excluded. 2. Hypermetabolic peritoneal soft tissue thickening in the right anatomic pelvis, worrisome for peritoneal carcinomatosis related to ovarian cancer.  02/26/24 Since I last saw the patient, she saw just oncology who is concerned about possible peritoneal carcinomatosis/metastasis.  Recommended CT scan of the abdomen and pelvis to evaluate further.  Also recommended possible biopsy to dictate treatment.  I do not see where the patient has scheduled the CT scan will go to the biopsy.  Fortunately she recently admitted the hospital with atrial fibrillation with RVR.  Upon discharge from the hospital, she went home on hospice.  She is here today for follow-up.  I  have copied the discharge summary below for reference:  Admit date: 02/06/2024   Discharge date: 02/11/2024   Admitted From:Home   Disposition:  Home   Recommendations for Outpatient Follow-up:  Follow up with PCP in 1-2 weeks Follow-up with home hospice agency Remain on amiodarone  as prescribed with 200 mg twice daily for 3 weeks and then 200 mg daily thereafter Lasix  40 mg daily and potassium 40 mill equivalents daily Toprol -XL 50 mg twice daily Continue other home medications as prior   Home Health: None   Equipment/Devices: None   Discharge Condition:Stable   CODE STATUS: DNR   Diet recommendation: Heart Healthy   Brief/Interim Summary: Abigail Wagner is a 73 y.o. female with medical history significant for paroxysmal atrial fibrillation on Eliquis , dyslipidemia, hypertension, previously treated anal cancer, recurrent ovarian cancer, chronic diastolic heart failure, carotid artery stenosis, and depression who presented to the ED with complaints of worsening lower extremity edema as well as depression and overwhelm.  Patient was admitted with symptomatic atrial fibrillation with RVR as well as acute heart failure symptoms.  She is being aggressively diuresed and heart rate control being managed by cardiology with use of IV amiodarone  infusion.  Her heart rates have shown significant improvement in control and she is transition to oral amiodarone  today as well as Toprol -XL twice daily.  Cardiology recommending transition to oral Lasix .  Patient is quite eager for discharge and has home hospice arrangements at this point.  No other acute events or concerns noted.   Discharge Diagnoses:  Principal Problem:   Atrial fibrillation with RVR (HCC) Active Problems:  Depression   History of ovarian cancer   Anal cancer (HCC)   Essential hypertension   Anxiety   Abdominal carcinomatosis (HCC)   Principal discharge diagnosis: Symptomatic atrial fibrillation with RVR along with acute  diastolic CHF exacerbation.  TODAY: Patient appears very frail and pale.  I was concerned that she was extremely anemic so I did perform a fingerstick hemoglobin.  Fortunately this was greater than 10 but is reassuring as she was not discharged from the hospital.  She does have a cough.  She denies any fevers.  Today she is in atrial fibrillation but her heart rate is controlled in the 80s.  On exam she has prominent crackles and rhonchi in the left lung.  There are no crackles or rhonchi heard in the right lung.  Last chest x-ray in the hospital did show a left pleural effusion and possible pneumonia in addition to pulmonary edema.  She denies any pleurisy.  She denies any hemoptysis.  She denies any shortness of breath.  We discussed her malignancy.  I explained that oncology recommended a CT scan of the abdomen and pelvis and a biopsy.  Essentially if she is not pursuing this she is electing to forego treatment.  I explained the implications of this including possible death.  Patient has declined hospice.  Therefore I wanted her to understand her choices.  She understands but at the present time she is not sure that she wants to go through a biopsy or any additional treatment.  At that time, my plan was: Patient appears extremely frail and cachectic.  However she does not appear fluid overloaded on exam today.  Her heart rate is controlled and she is appropriately anticoagulated.  I will check a CMP to monitor potassium and renal function.  Continue her current dose of Lasix  and potassium.  Patient declines referral for biopsy or CT scan of the abdomen and pelvis at the present time.  She understands her decisions but she is not sure that she wants to go through any additional treatment.  She will call me if she changes her mind.  I will treat the patient for possible pneumonia given the abnormal breath sounds.  Begin Augmentin  875 mg twice daily.  Recheck in 1 week or sooner if worse  06/06/24 Patient is  here today for follow-up.  She continues to decline any additional workup for the hypermetabolic activity seen in the pelvis on the PET scan earlier this year.  Oncology is concerned about peritoneal carcinomatosis or possible metastatic spread.  Both the patient and her husband are aware of this.  The patient declines any further treatment.  Her husband also declines any further treatment regarding her cancer.  Patient however appears confused today.  She states that she is fine.  She states that she is not in any pain.  However, her husband states that she is taking Percocet twice a day and then using BC powders 3-4 times in between.  She has a history of anemia.  Concerned about GI bleeding secondary to aspirin overuse.  I asked her why she is using the [facility] powders if she is not hurting.  She is unable to answer this question.  Husband states that she is becoming increasingly more confused and forgetful.  At times she is unable to remember who her husband is or what his name is.  She has only had 1 episode of atrial fibrillation since I last saw her.  It spontaneously stopped.  She denies any chest  pain or shortness of breath.  She denies any abdominal pain.  She continues to have cough and chest congestion especially in the left lower lobe.  She refuses a flu shot today.   Past Medical History:  Diagnosis Date   Allergy    Anal carcinoma (HCC)    Chemo and radiation   Anemia    Anxiety 1974   Dr. Okey at [place] (psychiatrist).     Blood transfusion without reported diagnosis 2017   Carotid artery disease (HCC)    Chronic kidney disease    DDD (degenerative disc disease), lumbosacral    GERD (gastroesophageal reflux disease) 2010   Headache(784.0)    History of adenomatous polyp of colon 10/07/2015   Tubular adenoma high grade dysplasia   History of herpes genitalis    History of kidney stones    History of suicide attempt 2010   HTN (hypertension)    Hyperlipidemia    IBS (irritable  bowel syndrome)    Major depression    OA (osteoarthritis)    Ovarian cancer (HCC)    Stage IIIB papillary ovarian carcinoma  s/p omentectomy and BSO & chemotherapy (completed 12-07-2014)   Paroxysmal atrial fibrillation (HCC)    Prolapse of vaginal vault after hysterectomy 07/20/2015   Rectovaginal fistula 08/05/2015   Seasonal allergies    Sigmoid diverticulosis    Smokers' cough [facility])     Past Surgical History:  Procedure Laterality Date   BIOPSY  10/12/2020   Procedure: BIOPSY;  Surgeon: Eartha Angelia Sieving, MD;  [facility];  Service: Gastroenterology;;   BREAST ENHANCEMENT SURGERY  1986   BREAST SURGERY  1986   CESAREAN SECTION  1978   COLONOSCOPY N/A 10/07/2015   Procedure: COLONOSCOPY;  Surgeon: Claudis RAYMOND Rivet, MD;  [facility];  Service: Endoscopy;  Laterality: N/A;  7:30   COLONOSCOPY WITH PROPOFOL  N/A 10/12/2020   Procedure: COLONOSCOPY WITH PROPOFOL ;  Surgeon: Eartha Angelia Sieving, MD;  [facility];  Service: Gastroenterology;  Laterality: N/A;  9:00   COSMETIC SURGERY  1986   CYSTOSCOPY N/A 02/27/2022   Procedure: CYSTOSCOPY;  Surgeon: Sherrilee Belvie CROME, MD;  Location: [facility];  Service: Urology;  Laterality: N/A;   CYSTOSCOPY WITH FULGERATION N/A 11/03/2021   Procedure: CYSTOSCOPY WITH FULGERATION;  Surgeon: Sherrilee Belvie CROME, MD;  Location: [facility];  Service: Urology;  Laterality: N/A;   ESOPHAGOGASTRODUODENOSCOPY (EGD) WITH PROPOFOL  N/A 10/12/2020   Procedure: ESOPHAGOGASTRODUODENOSCOPY (EGD) WITH PROPOFOL ;  Surgeon: Eartha Angelia Sieving, MD;  [facility];  Service: Gastroenterology;  Laterality: N/A;   EXPLORATORY LAPAROTOMY/ OMENTECTOMY/  BILATERAL SALPINGOOPHORECTOMY/  PORT-A-CATH PLACEMENT  07-03-2014   [place]   KNEE ARTHROSCOPY Left 09/22/2004   LAPAROSCOPY N/A 06/02/2014   Procedure: DIAGNOSTIC LAPAROSCOPY, OMENTAL BIOPSY, RIGHT OVARY BIOPSY, LYSIS OF ADHESIONS;  Surgeon: Elon Pacini, MD;   Location: [facility];  Service: General;  Laterality: N/A;   MUCOSAL ADVANCEMENT FLAP N/A 06/15/2016   Procedure: EXCISION RECTOVAGINAOL FISTULA WITH MUCOSAL ADVANCEMENT FLAP;  Surgeon: Bernarda Ned, MD;  Location: [facility];  Service: General;  Laterality: N/A;   PLACEMENT OF SETON N/A 09/09/2015   Procedure: PLACEMENT OF SETON;  Surgeon: Bernarda Ned, MD;  Location: [facility];  Service: General;  Laterality: N/A;   PORT-A-CATH REMOVAL Right 08/17/2014   Procedure: REMOVAL INTRAPERITONEAL CHEMO PORT;  Surgeon: Elon Pacini, MD;  Location: [facility];  Service: General;  Laterality: Right;   PORTACATH PLACEMENT Right 08/17/2014   Procedure:  PLACE NEW PORT A CATH;  Surgeon: Elon Pacini, MD;  Location: [facility];  Service: General;  Laterality: Right;   RECTAL BIOPSY N/A 09/09/2015   Procedure: BIOPSY OF RECTOVAGINAL MASS;  Surgeon: Bernarda Ned, MD;  Location: [facility];  Service: General;  Laterality: N/A;   TRANSURETHRAL RESECTION OF BLADDER TUMOR N/A 02/27/2022   Procedure: TRANSURETHRAL RESECTION OF BLADDER TUMOR (TURBT);  Surgeon: Sherrilee Belvie CROME, MD;  Location: [facility];  Service: Urology;  Laterality: N/A;   TUBAL LIGATION  YRS AGO   VAGINAL HYSTERECTOMY  1981   fibroids   Current Outpatient Medications on File Prior to Visit  Medication Sig Dispense Refill   amiodarone  (PACERONE ) 200 MG tablet Take 1 tablet (200 mg total) by mouth daily. 90 tablet 3   Aspirin-Salicylamide-Caffeine (BC HEADACHE POWDER PO) Take 1-2 Packages by mouth daily at 12 noon.     clotrimazole (LOTRIMIN) 1 % cream Apply 1 application topically 2 (two) times daily as needed (itching around ankles).     colestipol  (COLESTID ) 1 g tablet TAKE 2 TABLETS (2 G TOTAL) BY MOUTH DAILY. 180 tablet 1   CREON  36000-114000 units CPEP capsule TAKE 2 CAPSULES BY MOUTH 3 TIMES DAILY BEFORE MEALS. TWO CAPSULES WITH MEALS AND ONE WITH SNACKS. 200  capsule 1   divalproex  (DEPAKOTE  ER) 500 MG 24 hr tablet Take 1 tablet (500 mg total) by mouth daily. 90 tablet 3   ELIQUIS  5 MG TABS tablet TAKE 1 TABLET BY MOUTH TWICE A DAY 180 tablet 1   escitalopram  (LEXAPRO ) 20 MG tablet Take 1 tablet (20 mg total) by mouth daily. 90 tablet 2   estradiol  (ESTRACE ) 0.1 MG/GM vaginal cream Apply a pea size amount of cream to urethral area of vagina twice weekly 42.5 g 12   Ferrous Gluconate (IRON  27 PO) Take 65 mg by mouth daily. Pt is taking 65mg /325 daily     furosemide  (LASIX ) 40 MG tablet Take 1 tablet (40 mg total) by mouth daily. 30 tablet 11   LORazepam  (ATIVAN ) 0.5 MG tablet TAKE 1 TABLET (0.5 MG TOTAL) BY MOUTH 3 (THREE) TIMES DAILY AS NEEDED FOR ANXIETY. 90 tablet 2   metoprolol  succinate (TOPROL -XL) 50 MG 24 hr tablet TAKE 1 TABLET (50 MG TOTAL) BY MOUTH TWICE A DAY .TAKE WITH OR IMMEDIATELY FOLLOWING A MEAL 180 tablet 1   Multiple Vitamin (MULTIVITAMIN) tablet Take 1 tablet by mouth daily.     Multiple Vitamins-Minerals (HAIR SKIN & NAILS ADVANCED PO) Take 1 tablet by mouth 3 (three) times daily.     omeprazole  (PRILOSEC) 40 MG capsule Take 1 capsule (40 mg total) by mouth daily. 90 capsule 0   OVER THE COUNTER MEDICATION LYSENE 1 daily per husband     potassium chloride  SA (KLOR-CON  M) 20 MEQ tablet Take 2 tablets (40 mEq total) by mouth daily. 180 tablet 1   rosuvastatin  (CRESTOR ) 20 MG tablet Take 1 tablet (20 mg total) by mouth daily. 90 tablet 3   amoxicillin -clavulanate (AUGMENTIN ) 875-125 MG tablet Take 1 tablet by mouth 2 (two) times daily. (Patient not taking: Reported on 06/06/2024) 20 tablet 0   Cholecalciferol (VITAMIN D -3) 125 MCG (5000 UT) TABS Take 5,000 Units by mouth daily. (Patient not taking: Reported on 06/06/2024)     No current facility-administered medications on file prior to visit.   Allergies  Allergen Reactions   Morphine  And Codeine     Delirium with sbo   Social History   Socioeconomic History  Marital status:  Married    Spouse name: Jerel   Number of children: 4   Years of education: 12   Highest education level: 12th grade  Occupational History   Occupation: retire    Comment: [employer]  Tobacco Use   Smoking status: Every Day    Current packs/day: 0.00    Average packs/day: 0.5 packs/day for 15.0 years (7.5 ttl pk-yrs)    Types: Cigarettes    Start date: 02/17/2003    Last attempt to quit: 02/16/2018    Years since quitting: 6.3   Smokeless tobacco: Never   Tobacco comments:    half pack a day for 15 yrs  Vaping Use   Vaping status: Never Used  Substance and Sexual Activity   Alcohol use: No   Drug use: No   Sexual activity: Not Currently    Birth control/protection: Surgical    Comment: hyst  Other Topics Concern   Not on file  Social History Narrative   Lives at home with Jerel   Retired [employer]   Married 02/16/1990   Several grandchildren    Social Drivers of Health   Financial Resource Strain: Low Risk  (06/02/2024)   Overall Financial Resource Strain (CARDIA)    Difficulty of Paying Living Expenses: Not hard at all  Food Insecurity: No Food Insecurity (06/02/2024)   Hunger Vital Sign    Worried About Running Out of Food in the Last Year: Never true    Ran Out of Food in the Last Year: Never true  Transportation Needs: No Transportation Needs (06/02/2024)   PRAPARE - Administrator, Civil Service (Medical): No    Lack of Transportation (Non-Medical): No  Physical Activity: Inactive (06/02/2024)   Exercise Vital Sign    Days of Exercise per Week: 0 days    Minutes of Exercise per Session: Not on file  Stress: Stress Concern Present (06/02/2024)   [employer] of Occupational Health - Occupational Stress Questionnaire    Feeling of Stress: Very much  Social Connections: Moderately Isolated (06/02/2024)   Social Connection and Isolation Panel    Frequency of Communication with Friends and Family: Three times a week    Frequency of Social Gatherings with  Friends and Family: Once a week    Attends Religious Services: Patient declined    Database administrator or Organizations: No    Attends Engineer, structural: Not on file    Marital Status: Married  Catering manager Violence: Not At Risk (02/06/2024)   Humiliation, Afraid, Rape, and Kick questionnaire    Fear of Current or Ex-Partner: No    Emotionally Abused: No    Physically Abused: No    Sexually Abused: No   Family History  Problem Relation Age of Onset   Bipolar disorder Mother    Anxiety disorder Mother    Dementia Mother    Depression Mother    Mental illness Mother    Vision loss Mother    Alzheimer's disease Mother    Alcohol abuse Paternal Uncle    Bipolar disorder Maternal Grandmother    Dementia Maternal Grandmother    Alzheimer's disease Maternal Grandmother    Alcohol abuse Paternal Uncle    Stroke Brother    Deep vein thrombosis Son    Heart disease Father    Hyperlipidemia Father    Hypertension Father    Stroke Father    Vision loss Father    Atrial fibrillation Sister    Heart  disease Brother    Heart disease Brother    ADD / ADHD Neg Hx    Drug abuse Neg Hx    OCD Neg Hx    Paranoid behavior Neg Hx    Schizophrenia Neg Hx    Seizures Neg Hx    Sexual abuse Neg Hx    Physical abuse Neg Hx       Review of Systems     Objective:   Physical Exam Vitals reviewed.  Constitutional:      General: She is not in acute distress.    Appearance: She is ill-appearing. She is not toxic-appearing or diaphoretic.  Cardiovascular:     Rate and Rhythm: Normal rate and regular rhythm.     Heart sounds: Normal heart sounds. No murmur heard.    No friction rub. No gallop.  Pulmonary:     Effort: Pulmonary effort is normal. No respiratory distress.     Breath sounds: No stridor. Rhonchi and rales present. No wheezing.    Abdominal:     General: Abdomen is flat. Bowel sounds are normal. There is no distension.     Palpations: Abdomen is soft.  There is no mass.     Tenderness: There is no abdominal tenderness.  Musculoskeletal:     Right upper leg: No deformity.     Left upper leg: No deformity.     Right lower leg: No edema.     Left lower leg: No edema.  Neurological:     Mental Status: She is alert.    Wt Readings from Last 3 Encounters:  06/06/24 104 lb (47.2 kg)  06/02/24 105 lb 3.2 oz (47.7 kg)  02/26/24 107 lb (48.5 kg)        Assessment & Plan:  Abdominal carcinomatosis (HCC) - Plan: CBC with Differential/Platelet, Comprehensive metabolic panel with GFR  Atrial fibrillation, unspecified type (HCC) I believe the patient likely has abdominal carcinomatosis versus abdominal mets from an unknown primary.  Both she and her husband declined further workup.  They also declined hospice at the present time.  I believe her 81-month prognosis is very guarded.  She appears very frail and cachectic.  I do not want her to suffer.  Her husband states that she has severe back pain requiring medication 5 times a day.  I asked her to stop the [facility] powders to avoid GI bleeding.  I will increase oxycodone  to 7.5/325 3 times daily.  I asked her to supplement in between with Tylenol .  I will monitor her hemoglobin for any evidence of bleeding.  Consult hospice if her situation deteriorates further.  Focus on quality of life and comfort.

## 2024-06-09 ENCOUNTER — Ambulatory Visit: Payer: Self-pay | Admitting: Family Medicine

## 2024-06-11 ENCOUNTER — Other Ambulatory Visit: Payer: Self-pay | Admitting: Family Medicine

## 2024-06-18 ENCOUNTER — Other Ambulatory Visit: Payer: Self-pay

## 2024-06-18 DIAGNOSIS — I6523 Occlusion and stenosis of bilateral carotid arteries: Secondary | ICD-10-CM

## 2024-06-29 ENCOUNTER — Other Ambulatory Visit (HOSPITAL_COMMUNITY): Payer: Self-pay | Admitting: Psychiatry

## 2024-06-30 NOTE — Telephone Encounter (Signed)
 Call for appt

## 2024-07-07 ENCOUNTER — Ambulatory Visit (HOSPITAL_COMMUNITY)

## 2024-07-07 ENCOUNTER — Other Ambulatory Visit: Payer: Self-pay

## 2024-07-07 DIAGNOSIS — I4891 Unspecified atrial fibrillation: Secondary | ICD-10-CM

## 2024-07-07 NOTE — Telephone Encounter (Signed)
 Prescription Request  07/07/2024  LOV: 06/06/24  What is the name of the medication or equipment? ELIQUIS  5 MG TABS tablet [[id number]]   Have you contacted your pharmacy to request a refill? Yes   Which pharmacy would you like this sent to?  [id number] - [place], [place] - [address] AT [facility] [address] [place]  [address] Phone: [phone] Fax: [phone]    Patient notified that their request is being sent to the clinical staff for review and that they should receive a response within 2 business days.   Please advise at [facility] [phone]

## 2024-07-07 NOTE — Telephone Encounter (Signed)
 Scheduled 07-24-24

## 2024-07-08 MED ORDER — APIXABAN 5 MG PO TABS: 5.0000 mg | ORAL_TABLET | Freq: Two times a day (BID) | ORAL | 1 refills | Status: AC

## 2024-07-08 NOTE — Telephone Encounter (Signed)
 Requested Prescriptions  Pending Prescriptions Disp Refills   apixaban  (ELIQUIS ) 5 MG TABS tablet 180 tablet 1    Sig: Take 1 tablet (5 mg total) by mouth 2 (two) times daily.     Hematology:  Anticoagulants - apixaban  Failed - 07/08/2024  5:14 PM      Failed - HGB in normal range and within 360 days    Hemoglobin  Date Value Ref Range Status  06/06/2024 8.8 (L) 11.7 - 15.5 g/dL Final  97/94/7974 89.3 (L) 11.1 - 15.9 g/dL Final         Failed - HCT in normal range and within 360 days    HCT  Date Value Ref Range Status  06/06/2024 28.1 (L) 35.0 - 45.0 % Final   Hematocrit  Date Value Ref Range Status  10/31/2023 32.4 (L) 34.0 - 46.6 % Final         Passed - PLT in normal range and within 360 days    Platelets  Date Value Ref Range Status  06/06/2024 361 140 - 400 Thousand/uL Final  10/31/2023 254 150 - 450 x10E3/uL Final         Passed - Cr in normal range and within 360 days    Creat  Date Value Ref Range Status  06/06/2024 0.99 0.60 - 1.00 mg/dL Final         Passed - AST in normal range and within 360 days    AST  Date Value Ref Range Status  06/06/2024 16 10 - 35 U/L Final         Passed - ALT in normal range and within 360 days    ALT  Date Value Ref Range Status  06/06/2024 10 6 - 29 U/L Final         Passed - Valid encounter within last 12 months    Recent Outpatient Visits           1 month ago Abdominal carcinomatosis [facility])   [place] [facility] Duanne Butler DASEN, MD   4 months ago Atrial fibrillation, unspecified type [facility])   [place] [facility] Duanne Butler DASEN, MD   5 months ago Hematoma of scalp, subsequent encounter   [place] [facility] Duanne Butler DASEN, MD   6 months ago Visit for suture removal   [place] [facility] Duanne Butler DASEN, MD   6 months ago Abnormal CT scan of lung   [place] [facility] [place], Butler DASEN, MD

## 2024-07-09 ENCOUNTER — Encounter (INDEPENDENT_AMBULATORY_CARE_PROVIDER_SITE_OTHER): Payer: Self-pay | Admitting: Gastroenterology

## 2024-07-10 ENCOUNTER — Other Ambulatory Visit

## 2024-07-10 DIAGNOSIS — D649 Anemia, unspecified: Secondary | ICD-10-CM

## 2024-07-10 LAB — CBC WITH DIFFERENTIAL/PLATELET
Absolute Lymphocytes: 1306 {cells}/uL (ref 850–3900)
Absolute Monocytes: 423 {cells}/uL (ref 200–950)
Basophils Absolute: 51 {cells}/uL (ref 0–200)
Basophils Relative: 1 %
Eosinophils Absolute: 71 {cells}/uL (ref 15–500)
Eosinophils Relative: 1.4 %
HCT: 31.1 % — ABNORMAL LOW (ref 35.0–45.0)
Hemoglobin: 9.7 g/dL — ABNORMAL LOW (ref 11.7–15.5)
MCH: 28.5 pg (ref 27.0–33.0)
MCHC: 31.2 g/dL — ABNORMAL LOW (ref 32.0–36.0)
MCV: 91.5 fL (ref 80.0–100.0)
MPV: 10.2 fL (ref 7.5–12.5)
Monocytes Relative: 8.3 %
Neutro Abs: 3249 {cells}/uL (ref 1500–7800)
Neutrophils Relative %: 63.7 %
Platelets: 479 Thousand/uL — ABNORMAL HIGH (ref 140–400)
RBC: 3.4 Million/uL — ABNORMAL LOW (ref 3.80–5.10)
RDW: 17 % — ABNORMAL HIGH (ref 11.0–15.0)
Total Lymphocyte: 25.6 %
WBC: 5.1 Thousand/uL (ref 3.8–10.8)

## 2024-07-11 ENCOUNTER — Ambulatory Visit: Payer: Self-pay | Admitting: Family Medicine

## 2024-07-15 ENCOUNTER — Ambulatory Visit (HOSPITAL_COMMUNITY)

## 2024-07-15 ENCOUNTER — Encounter (HOSPITAL_COMMUNITY): Payer: Self-pay

## 2024-07-17 ENCOUNTER — Ambulatory Visit (INDEPENDENT_AMBULATORY_CARE_PROVIDER_SITE_OTHER): Admitting: *Deleted

## 2024-07-17 VITALS — Ht 66.0 in | Wt 104.0 lb

## 2024-07-17 DIAGNOSIS — Z0000 Encounter for general adult medical examination without abnormal findings: Secondary | ICD-10-CM

## 2024-07-17 NOTE — Progress Notes (Signed)
 Subjective:   Abigail Wagner is a 73 y.o. female who presents for Medicare Annual (Subsequent) preventive examination.  Visit Complete: Virtual I connected with  Rashae M Pacifico on 07/17/24 by a audio  husband Jerel assisted enabled telemedicine application and verified that I am speaking with the correct person using two identifiers.  Patient Location: Home  Provider Location: Home Office  I discussed the limitations of evaluation and management by telemedicine. The patient expressed understanding and agreed to proceed.  Vital Signs: Because this visit was a virtual/telehealth visit, some criteria may be missing or patient reported. Any vitals not documented were not able to be obtained and vitals that have been documented are patient reported.    Cardiac Risk Factors include: advanced age (>68men, >11 women)     Objective:    Today's Vitals   07/17/24 0832  Weight: 104 lb (47.2 kg)  Height: 5' 6 (1.676 m)   Body mass index is 16.79 kg/m.     07/17/2024    8:24 AM 02/06/2024   10:42 AM 02/06/2024    8:19 AM 01/29/2024    9:48 AM 01/13/2024   12:44 AM 12/28/2023   10:56 PM 02/08/2023   11:24 AM  Advanced Directives  Does Patient Have a Medical Advance Directive? Yes Yes No No No No Yes  Type of Advance Directive Healthcare Power of Attorney Living will;Healthcare Power of Attorney     Living will;Healthcare Power of Attorney  Does patient want to make changes to medical advance directive?  No - Patient declined     No - Patient declined  Copy of Healthcare Power of Attorney in Chart? No - copy requested No - copy requested     No - copy requested  Would patient like information on creating a medical advance directive?  No - Patient declined No - Patient declined No - Patient declined No - Patient declined      Current Medications (verified) Outpatient Encounter Medications as of 07/17/2024  Medication Sig   amiodarone  (PACERONE ) 200 MG tablet Take 1 tablet (200 mg total) by  mouth daily.   apixaban  (ELIQUIS ) 5 MG TABS tablet Take 1 tablet (5 mg total) by mouth 2 (two) times daily.   clotrimazole (LOTRIMIN) 1 % cream Apply 1 application topically 2 (two) times daily as needed (itching around ankles).   colestipol  (COLESTID ) 1 g tablet TAKE 2 TABLETS (2 G TOTAL) BY MOUTH DAILY.   CREON  36000-114000 units CPEP capsule TAKE 2 CAPSULES BY MOUTH 3 TIMES DAILY BEFORE MEALS. TWO CAPSULES WITH MEALS AND ONE WITH SNACKS.   divalproex  (DEPAKOTE  ER) 500 MG 24 hr tablet Take 1 tablet (500 mg total) by mouth daily.   escitalopram  (LEXAPRO ) 20 MG tablet TAKE 1 TABLET BY MOUTH EVERY DAY   estradiol  (ESTRACE ) 0.1 MG/GM vaginal cream Apply a pea size amount of cream to urethral area of vagina twice weekly   Ferrous Gluconate (IRON  27 PO) Take 65 mg by mouth daily. Pt is taking 65mg /325 daily   furosemide  (LASIX ) 40 MG tablet Take 1 tablet (40 mg total) by mouth daily.   LORazepam  (ATIVAN ) 0.5 MG tablet TAKE 1 TABLET (0.5 MG TOTAL) BY MOUTH 3 (THREE) TIMES DAILY AS NEEDED FOR ANXIETY.   metoprolol  succinate (TOPROL -XL) 50 MG 24 hr tablet TAKE 1 TABLET (50 MG TOTAL) BY MOUTH TWICE A DAY .TAKE WITH OR IMMEDIATELY FOLLOWING A MEAL   Multiple Vitamin (MULTIVITAMIN) tablet Take 1 tablet by mouth daily.   Multiple Vitamins-Minerals (HAIR SKIN &  NAILS ADVANCED PO) Take 1 tablet by mouth 3 (three) times daily.   omeprazole  (PRILOSEC) 40 MG capsule TAKE 1 CAPSULE (40 MG TOTAL) BY MOUTH DAILY.   OVER THE COUNTER MEDICATION LYSENE 1 daily per husband   oxyCODONE -acetaminophen  (PERCOCET) 7.5-325 MG tablet Take 1 tablet by mouth every 8 (eight) hours as needed for severe pain (pain score 7-10). This script should last 30 days.  60 per month   potassium chloride  SA (KLOR-CON  M) 20 MEQ tablet Take 2 tablets (40 mEq total) by mouth daily.   rosuvastatin  (CRESTOR ) 20 MG tablet Take 1 tablet (20 mg total) by mouth daily.   amoxicillin -clavulanate (AUGMENTIN ) 875-125 MG tablet Take 1 tablet by mouth 2  (two) times daily. (Patient not taking: Reported on 07/17/2024)   Aspirin-Salicylamide-Caffeine (BC HEADACHE POWDER PO) Take 1-2 Packages by mouth daily at 12 noon.   Cholecalciferol (VITAMIN D -3) 125 MCG (5000 UT) TABS Take 5,000 Units by mouth daily. (Patient not taking: Reported on 06/06/2024)   No facility-administered encounter medications on file as of 07/17/2024.    Allergies (verified) Morphine  and codeine   History: Past Medical History:  Diagnosis Date   Allergy    Anal carcinoma (HCC)    Chemo and radiation   Anemia    Anxiety 1974   Dr. Okey at [facility] (psychiatrist).     Blood transfusion without reported diagnosis 2017   Carotid artery disease    Chronic kidney disease    DDD (degenerative disc disease), lumbosacral    GERD (gastroesophageal reflux disease) 2010   Headache(784.0)    History of adenomatous polyp of colon 10/07/2015   Tubular adenoma high grade dysplasia   History of herpes genitalis    History of kidney stones    History of suicide attempt 2010   HTN (hypertension)    Hyperlipidemia    IBS (irritable bowel syndrome)    Major depression    OA (osteoarthritis)    Ovarian cancer (HCC)    Stage IIIB papillary ovarian carcinoma  s/p omentectomy and BSO & chemotherapy (completed 12-07-2014)   Paroxysmal atrial fibrillation (HCC)    Prolapse of vaginal vault after hysterectomy 07/20/2015   Rectovaginal fistula 08/05/2015   Seasonal allergies    Sigmoid diverticulosis    Smokers' cough [facility])    Past Surgical History:  Procedure Laterality Date   BIOPSY  10/12/2020   Procedure: BIOPSY;  Surgeon: Eartha Angelia Sieving, MD;  [facility];  Service: Gastroenterology;;   BREAST ENHANCEMENT SURGERY  1986   BREAST SURGERY  1986   CESAREAN SECTION  1978   COLONOSCOPY N/A 10/07/2015   Procedure: COLONOSCOPY;  Surgeon: Claudis RAYMOND Rivet, MD;  [facility];  Service: Endoscopy;  Laterality: N/A;  7:30   COLONOSCOPY WITH  PROPOFOL  N/A 10/12/2020   Procedure: COLONOSCOPY WITH PROPOFOL ;  Surgeon: Eartha Angelia Sieving, MD;  [facility];  Service: Gastroenterology;  Laterality: N/A;  9:00   COSMETIC SURGERY  1986   CYSTOSCOPY N/A 02/27/2022   Procedure: CYSTOSCOPY;  Surgeon: Sherrilee Belvie CROME, MD;  Location: [facility];  Service: Urology;  Laterality: N/A;   CYSTOSCOPY WITH FULGERATION N/A 11/03/2021   Procedure: CYSTOSCOPY WITH FULGERATION;  Surgeon: Sherrilee Belvie CROME, MD;  Location: [facility];  Service: Urology;  Laterality: N/A;   ESOPHAGOGASTRODUODENOSCOPY (EGD) WITH PROPOFOL  N/A 10/12/2020   Procedure: ESOPHAGOGASTRODUODENOSCOPY (EGD) WITH PROPOFOL ;  Surgeon: Eartha Angelia Sieving, MD;  [facility];  Service: Gastroenterology;  Laterality: N/A;   EXPLORATORY LAPAROTOMY/ OMENTECTOMY/  BILATERAL SALPINGOOPHORECTOMY/  PORT-A-CATH PLACEMENT  07-03-2014   [place]   KNEE ARTHROSCOPY Left 09/22/2004   LAPAROSCOPY N/A 06/02/2014   Procedure: DIAGNOSTIC LAPAROSCOPY, OMENTAL BIOPSY, RIGHT OVARY BIOPSY, LYSIS OF ADHESIONS;  Surgeon: Elon Pacini, MD;  Location[facility];  Service: General;  Laterality: N/A;   MUCOSAL ADVANCEMENT FLAP N/A 06/15/2016   Procedure: EXCISION RECTOVAGINAOL FISTULA WITH MUCOSAL ADVANCEMENT FLAP;  Surgeon: Bernarda Ned, MD;  Location: [facility];  Service: General;  Laterality: N/A;   PLACEMENT OF SETON N/A 09/09/2015   Procedure: PLACEMENT OF SETON;  Surgeon: Bernarda Ned, MD;  Location: [facility];  Service: General;  Laterality: N/A;   PORT-A-CATH REMOVAL Right 08/17/2014   Procedure: REMOVAL INTRAPERITONEAL CHEMO PORT;  Surgeon: Elon Pacini, MD;  Location: [facility];  Service: General;  Laterality: Right;   PORTACATH PLACEMENT Right 08/17/2014   Procedure:  PLACE NEW PORT A CATH;  Surgeon: Elon Pacini, MD;  Location: [facility];  Service: General;  Laterality: Right;   RECTAL BIOPSY N/A  09/09/2015   Procedure: BIOPSY OF RECTOVAGINAL MASS;  Surgeon: Bernarda Ned, MD;  Location: [facility];  Service: General;  Laterality: N/A;   TRANSURETHRAL RESECTION OF BLADDER TUMOR N/A 02/27/2022   Procedure: TRANSURETHRAL RESECTION OF BLADDER TUMOR (TURBT);  Surgeon: Sherrilee Belvie CROME, MD;  Location: [facility];  Service: Urology;  Laterality: N/A;   TUBAL LIGATION  YRS AGO   VAGINAL HYSTERECTOMY  1981   fibroids   Family History  Problem Relation Age of Onset   Bipolar disorder Mother    Anxiety disorder Mother    Dementia Mother    Depression Mother    Mental illness Mother    Vision loss Mother    Alzheimer's disease Mother    Alcohol abuse Paternal Uncle    Bipolar disorder Maternal Grandmother    Dementia Maternal Grandmother    Alzheimer's disease Maternal Grandmother    Alcohol abuse Paternal Uncle    Stroke Brother    Deep vein thrombosis Son    Heart disease Father    Hyperlipidemia Father    Hypertension Father    Stroke Father    Vision loss Father    Atrial fibrillation Sister    Heart disease Brother    Heart disease Brother    ADD / ADHD Neg Hx    Drug abuse Neg Hx    OCD Neg Hx    Paranoid behavior Neg Hx    Schizophrenia Neg Hx    Seizures Neg Hx    Sexual abuse Neg Hx    Physical abuse Neg Hx    Social History   Socioeconomic History   Marital status: Married    Spouse name: Jerel   Number of children: 4   Years of education: 12   Highest education level: 12th grade  Occupational History   Occupation: retire    Comment: [employer]  Tobacco Use   Smoking status: Every Day    Current packs/day: 0.00    Average packs/day: 0.5 packs/day for 15.0 years (7.5 ttl pk-yrs)    Types: Cigarettes    Start date: 02/17/2003    Last attempt to quit: 02/16/2018    Years since quitting: 6.4   Smokeless tobacco: Never   Tobacco comments:    half pack a day for 15 yrs  Vaping Use   Vaping status: Never Used  Substance and Sexual Activity    Alcohol use: No   Drug use:  No   Sexual activity: Not Currently    Birth control/protection: Surgical    Comment: hyst  Other Topics Concern   Not on file  Social History Narrative   Lives at home with Jerel   Retired [employer]   Married 02/16/1990   Several grandchildren    Social Drivers of Health   Financial Resource Strain: Low Risk  (07/17/2024)   Overall Financial Resource Strain (CARDIA)    Difficulty of Paying Living Expenses: Not hard at all  Food Insecurity: No Food Insecurity (06/02/2024)   Hunger Vital Sign    Worried About Running Out of Food in the Last Year: Never true    Ran Out of Food in the Last Year: Never true  Transportation Needs: No Transportation Needs (06/02/2024)   PRAPARE - Administrator, Civil Service (Medical): No    Lack of Transportation (Non-Medical): No  Physical Activity: Inactive (07/17/2024)   Exercise Vital Sign    Days of Exercise per Week: 0 days    Minutes of Exercise per Session: 0 min  Stress: No Stress Concern Present (07/17/2024)   [employer] of Occupational Health - Occupational Stress Questionnaire    Feeling of Stress: Only a little  Recent Concern: Stress - Stress Concern Present (06/02/2024)   [employer] of Occupational Health - Occupational Stress Questionnaire    Feeling of Stress: Very much  Social Connections: Moderately Isolated (07/17/2024)   Social Connection and Isolation Panel    Frequency of Communication with Friends and Family: More than three times a week    Frequency of Social Gatherings with Friends and Family: Once a week    Attends Religious Services: Never    Database administrator or Organizations: No    Attends Engineer, structural: Never    Marital Status: Married    Tobacco Counseling Ready to quit: Not Answered Counseling given: Not Answered Tobacco comments: half pack a day for 15 yrs   Clinical Intake:  Pre-visit preparation completed: Yes  Pain :  No/denies pain     Diabetes: No  How often do you need to have someone help you when you read instructions, pamphlets, or other written materials from your doctor or pharmacy?: 4 - Often  Interpreter Needed?: No  Information entered by :: Mliss Graff LPN   Activities of Daily Living    07/17/2024    8:31 AM 02/06/2024   10:42 AM  In your present state of health, do you have any difficulty performing the following activities:  Hearing? 1 0  Vision? 0 0  Difficulty concentrating or making decisions? 1 0  Walking or climbing stairs? 1   Dressing or bathing? 0   Doing errands, shopping? 1 0  Preparing Food and eating ? N   Using the Toilet? N   In the past six months, have you accidently leaked urine? Y   Do you have problems with loss of bowel control? Y   Managing your Medications? Y   Managing your Finances? Y   Housekeeping or managing your Housekeeping? Y     Patient Care Team: Duanne Butler DASEN, MD as PCP - General (Family Medicine) Debera Jayson MATSU, MD as PCP - Cardiology (Cardiology) Golda Claudis PENNER, MD (Inactive) as Consulting Physician (Gastroenterology) Okey Barnie SAUNDERS, MD as Consulting Physician [facility]) Debby Hila, MD as Consulting Physician (General Surgery) Berry, Thomas S, PA-C as Physician Assistant (Oncology) Barbarann Oneil BROCKS, MD (Inactive) as Consulting Physician (Orthopedic Surgery) Nicholaus Sherlean CROME,  RPH (Inactive) as Pharmacist (Pharmacist) Darlean Ozell NOVAK, MD as Consulting Physician (Pulmonary Disease) Darlean Ozell NOVAK, MD as Consulting Physician (Pulmonary Disease)  Indicate any recent Medical Services you may have received from other than [facility] providers in the past year (date may be approximate).     Assessment:   This is a routine wellness examination for Abigail Wagner.  Hearing/Vision screen Hearing Screening - Comments:: Has hearing aids does not wear them Vision Screening - Comments:: Cataract surgery   [facility] 1 year ago    Goals Addressed             This Visit's Progress    Patient Stated       No goals       Depression Screen    07/17/2024    8:29 AM 06/06/2024   12:18 PM 01/01/2024   10:37 AM 09/18/2023   11:56 AM 07/17/2023   12:25 PM 02/08/2023   11:22 AM 10/30/2022    9:40 AM  PHQ 2/9 Scores  PHQ - 2 Score 0 2 1 1  0 0 0  PHQ- 9 Score 9 11 5 6        Fall Risk    07/17/2024    8:23 AM 06/06/2024   12:18 PM 01/01/2024   10:04 AM 07/17/2023   12:25 PM 02/08/2023   11:23 AM  Fall Risk   Falls in the past year? 1 1 1  0 1  Number falls in past yr: 0 1 1 0 1  Injury with Fall? 0 1 1 0 0  Risk for fall due to :  History of fall(s);Impaired balance/gait;Orthopedic patient History of fall(s);Impaired balance/gait;Impaired mobility No Fall Risks History of fall(s);Impaired balance/gait  Follow up Falls evaluation completed;Education provided;Falls prevention discussed Falls evaluation completed;Education provided Falls prevention discussed;Falls evaluation completed Falls prevention discussed Falls prevention discussed;Education provided;Falls evaluation completed    MEDICARE RISK AT HOME: Medicare Risk at Home Any stairs in or around the home?: Yes If so, are there any without handrails?: No Home free of loose throw rugs in walkways, pet beds, electrical cords, etc?: Yes Adequate lighting in your home to reduce risk of falls?: Yes Life alert?: No Use of a cane, walker or w/c?: No Grab bars in the bathroom?: Yes Shower chair or bench in shower?: Yes Elevated toilet seat or a handicapped toilet?: Yes  TIMED UP AND GO:  Was the test performed?  No    Cognitive Function:        07/17/2024    8:27 AM 02/08/2023   11:24 AM 01/27/2022   10:59 AM 01/10/2021    9:56 AM  6CIT Screen  What Year? 4 points 0 points 0 points 0 points  What month? 3 points 0 points 0 points 0 points  What time? 3 points 0 points 0 points 0 points  Count back from 20 4 points 0 points 0 points 0 points  Months in  reverse 4 points 0 points 2 points 0 points  Repeat phrase 10 points 2 points 2 points 0 points  Total Score 28 points 2 points 4 points 0 points    Immunizations Immunization History  Administered Date(s) Administered   Fluad Quad(high Dose 65+) 08/16/2020, 10/30/2022   INFLUENZA, HIGH DOSE SEASONAL PF 07/17/2017, 07/09/2018, 06/16/2019   Influenza-Unspecified 10/04/2016   Moderna Sars-Covid-2 Vaccination 12/25/2019, 01/24/2020, 08/16/2020   Pfizer(Comirnaty)Fall Seasonal Vaccine 12 years and older 11/08/2022   Pneumococcal Conjugate-13 11/07/2016   Pneumococcal Polysaccharide-23 05/14/2018   Respiratory Syncytial Virus Vaccine,Recomb Aduvanted(Arexvy) 11/08/2022  Tdap 09/25/2008    TDAP status: Due, Education has been provided regarding the importance of this vaccine. Advised may receive this vaccine at local pharmacy or Health Dept. Aware to provide a copy of the vaccination record if obtained from local pharmacy or Health Dept. Verbalized acceptance and understanding.  Flu Vaccine status: Due, Education has been provided regarding the importance of this vaccine. Advised may receive this vaccine at local pharmacy or Health Dept. Aware to provide a copy of the vaccination record if obtained from local pharmacy or Health Dept. Verbalized acceptance and understanding.  Pneumococcal vaccine status: Up to date  Covid-19 vaccine status: Information provided on how to obtain vaccines.   Qualifies for Shingles Vaccine? Yes   Zostavax completed No   Shingrix Completed?: No.    Education has been provided regarding the importance of this vaccine. Patient has been advised to call insurance company to determine out of pocket expense if they have not yet received this vaccine. Advised may also receive vaccine at local pharmacy or Health Dept. Verbalized acceptance and understanding.  Screening Tests Health Maintenance  Topic Date Due   Zoster Vaccines- Shingrix (1 of 2) Never done    COVID-19 Vaccine (5 - 2025-26 season) 05/26/2024   Mammogram  11/13/2024   Medicare Annual Wellness (AWV)  07/17/2025   Colonoscopy  10/12/2030   Pneumococcal Vaccine: 50+ Years  Completed   DEXA SCAN  Completed   Hepatitis C Screening  Completed   Meningococcal B Vaccine  Aged Out   DTaP/Tdap/Td  Discontinued    Health Maintenance  Health Maintenance Due  Topic Date Due   Zoster Vaccines- Shingrix (1 of 2) Never done   COVID-19 Vaccine (5 - 2025-26 season) 05/26/2024    Colorectal cancer screening: Type of screening: Colonoscopy. Completed 2022. Repeat every 10 years  Mammogram status: Completed  . Repeat every year  Bone Density status: Completed 2024. Results reflect: Bone density results: OSTEOPENIA. Repeat every 2 years.  Lung Cancer Screening: (Low Dose CT Chest recommended if Age 44-80 years, 20 pack-year currently smoking OR have quit w/in 15years.) does qualify.   Lung Cancer Screening Referral:   declined  Additional Screening:  Hepatitis C Screening: does not qualify; Completed 2018  Vision Screening: Recommended annual ophthalmology exams for early detection of glaucoma and other disorders of the eye. Is the patient up to date with their annual eye exam?  No  Who is the provider or what is the name of the office in which the patient attends annual eye exams? [facility] If pt is not established with a provider, would they like to be referred to a provider to establish care? No .   Dental Screening: Recommended annual dental exams for proper oral hygiene    Community Resource Referral / Chronic Care Management: CRR required this visit?  No   CCM required this visit?  No     Plan:     I have personally reviewed and noted the following in the patient's chart:   Medical and social history Use of alcohol, tobacco or illicit drugs  Current medications and supplements including opioid prescriptions. Patient is currently taking opioid prescriptions.  Information provided to patient regarding non-opioid alternatives. Patient advised to discuss non-opioid treatment plan with their provider. Functional ability and status Nutritional status Physical activity Advanced directives List of other physicians Hospitalizations, surgeries, and ER visits in previous 12 months Vitals Screenings to include cognitive, depression, and falls Referrals and appointments  In addition, I have reviewed and discussed  with patient certain preventive protocols, quality metrics, and best practice recommendations. A written personalized care plan for preventive services as well as general preventive health recommendations were provided to patient.     Mliss Graff, LPN   [id number]   After Visit Summary: (MyChart) Due to this being a telephonic visit, the after visit summary with patients personalized plan was offered to patient via MyChart   Nurse Notes:

## 2024-07-17 NOTE — Patient Instructions (Signed)
 Abigail Wagner , Thank you for taking time to come for your Medicare Wellness Visit. I appreciate your ongoing commitment to your health goals. Please review the following plan we discussed and let me know if I can assist you in the future.   Screening recommendations/referrals: Colonoscopy:  Mammogram:  Bone Density:  Recommended yearly ophthalmology/optometry visit for glaucoma screening and checkup Recommended yearly dental visit for hygiene and checkup  Vaccinations: Influenza vaccine:  Pneumococcal vaccine:  Tdap vaccine:  Shingles vaccine:          Preventive Care 65 Years and Older, Female Preventive care refers to lifestyle choices and visits with your health care provider that can promote health and wellness. What does preventive care include? A yearly physical exam. This is also called an annual well check. Dental exams once or twice a year. Routine eye exams. Ask your health care provider how often you should have your eyes checked. Personal lifestyle choices, including: Daily care of your teeth and gums. Regular physical activity. Eating a healthy diet. Avoiding tobacco and drug use. Limiting alcohol use. Practicing safe sex. Taking low-dose aspirin every day. Taking vitamin and mineral supplements as recommended by your health care provider. What happens during an annual well check? The services and screenings done by your health care provider during your annual well check will depend on your age, overall health, lifestyle risk factors, and family history of disease. Counseling  Your health care provider may ask you questions about your: Alcohol use. Tobacco use. Drug use. Emotional well-being. Home and relationship well-being. Sexual activity. Eating habits. History of falls. Memory and ability to understand (cognition). Work and work Astronomer. Reproductive health. Screening  You may have the following tests or measurements: Height, weight, and  BMI. Blood pressure. Lipid and cholesterol levels. These may be checked every 5 years, or more frequently if you are over 77 years old. Skin check. Lung cancer screening. You may have this screening every year starting at age 56 if you have a 30-pack-year history of smoking and currently smoke or have quit within the past 15 years. Fecal occult blood test (FOBT) of the stool. You may have this test every year starting at age 25. Flexible sigmoidoscopy or colonoscopy. You may have a sigmoidoscopy every 5 years or a colonoscopy every 10 years starting at age 23. Hepatitis C blood test. Hepatitis B blood test. Sexually transmitted disease (STD) testing. Diabetes screening. This is done by checking your blood sugar (glucose) after you have not eaten for a while (fasting). You may have this done every 1-3 years. Bone density scan. This is done to screen for osteoporosis. You may have this done starting at age 92. Mammogram. This may be done every 1-2 years. Talk to your health care provider about how often you should have regular mammograms. Talk with your health care provider about your test results, treatment options, and if necessary, the need for more tests. Vaccines  Your health care provider may recommend certain vaccines, such as: Influenza vaccine. This is recommended every year. Tetanus, diphtheria, and acellular pertussis (Tdap, Td) vaccine. You may need a Td booster every 10 years. Zoster vaccine. You may need this after age 78. Pneumococcal 13-valent conjugate (PCV13) vaccine. One dose is recommended after age 56. Pneumococcal polysaccharide (PPSV23) vaccine. One dose is recommended after age 58. Talk to your health care provider about which screenings and vaccines you need and how often you need them. This information is not intended to replace advice given to you by  your health care provider. Make sure you discuss any questions you have with your health care provider. Document  Released: 10/08/2015 Document Revised: 05/31/2016 Document Reviewed: 07/13/2015 Elsevier Interactive Patient Education  2017 [employer].  Fall Prevention in the Home Falls can cause injuries. They can happen to people of all ages. There are many things you can do to make your home safe and to help prevent falls. What can I do on the outside of my home? Regularly fix the edges of walkways and driveways and fix any cracks. Remove anything that might make you trip as you walk through a door, such as a raised step or threshold. Trim any bushes or trees on the path to your home. Use bright outdoor lighting. Clear any walking paths of anything that might make someone trip, such as rocks or tools. Regularly check to see if handrails are loose or broken. Make sure that both sides of any steps have handrails. Any raised decks and porches should have guardrails on the edges. Have any leaves, snow, or ice cleared regularly. Use sand or salt on walking paths during winter. Clean up any spills in your garage right away. This includes oil or grease spills. What can I do in the bathroom? Use night lights. Install grab bars by the toilet and in the tub and shower. Do not use towel bars as grab bars. Use non-skid mats or decals in the tub or shower. If you need to sit down in the shower, use a plastic, non-slip stool. Keep the floor dry. Clean up any water  that spills on the floor as soon as it happens. Remove soap buildup in the tub or shower regularly. Attach bath mats securely with double-sided non-slip rug tape. Do not have throw rugs and other things on the floor that can make you trip. What can I do in the bedroom? Use night lights. Make sure that you have a light by your bed that is easy to reach. Do not use any sheets or blankets that are too big for your bed. They should not hang down onto the floor. Have a firm chair that has side arms. You can use this for support while you get dressed. Do  not have throw rugs and other things on the floor that can make you trip. What can I do in the kitchen? Clean up any spills right away. Avoid walking on wet floors. Keep items that you use a lot in easy-to-reach places. If you need to reach something above you, use a strong step stool that has a grab bar. Keep electrical cords out of the way. Do not use floor polish or wax that makes floors slippery. If you must use wax, use non-skid floor wax. Do not have throw rugs and other things on the floor that can make you trip. What can I do with my stairs? Do not leave any items on the stairs. Make sure that there are handrails on both sides of the stairs and use them. Fix handrails that are broken or loose. Make sure that handrails are as long as the stairways. Check any carpeting to make sure that it is firmly attached to the stairs. Fix any carpet that is loose or worn. Avoid having throw rugs at the top or bottom of the stairs. If you do have throw rugs, attach them to the floor with carpet tape. Make sure that you have a light switch at the top of the stairs and the bottom of the stairs. If you  do not have them, ask someone to add them for you. What else can I do to help prevent falls? Wear shoes that: Do not have high heels. Have rubber bottoms. Are comfortable and fit you well. Are closed at the toe. Do not wear sandals. If you use a stepladder: Make sure that it is fully opened. Do not climb a closed stepladder. Make sure that both sides of the stepladder are locked into place. Ask someone to hold it for you, if possible. Clearly mark and make sure that you can see: Any grab bars or handrails. First and last steps. Where the edge of each step is. Use tools that help you move around (mobility aids) if they are needed. These include: Canes. Walkers. Scooters. Crutches. Turn on the lights when you go into a dark area. Replace any light bulbs as soon as they burn out. Set up your  furniture so you have a clear path. Avoid moving your furniture around. If any of your floors are uneven, fix them. If there are any pets around you, be aware of where they are. Review your medicines with your doctor. Some medicines can make you feel dizzy. This can increase your chance of falling. Ask your doctor what other things that you can do to help prevent falls. This information is not intended to replace advice given to you by your health care provider. Make sure you discuss any questions you have with your health care provider. Document Released: 07/08/2009 Document Revised: 02/17/2016 Document Reviewed: 10/16/2014 Elsevier Interactive Patient Education  2017 [employer].

## 2024-07-21 ENCOUNTER — Ambulatory Visit (HOSPITAL_COMMUNITY)

## 2024-07-23 ENCOUNTER — Encounter (HOSPITAL_COMMUNITY): Payer: Self-pay

## 2024-07-23 NOTE — Telephone Encounter (Signed)
 Abigail Wagner, please change this

## 2024-07-24 ENCOUNTER — Telehealth (HOSPITAL_COMMUNITY): Admitting: Psychiatry

## 2024-07-24 ENCOUNTER — Encounter (HOSPITAL_COMMUNITY): Payer: Self-pay | Admitting: Psychiatry

## 2024-07-24 DIAGNOSIS — F331 Major depressive disorder, recurrent, moderate: Secondary | ICD-10-CM

## 2024-07-24 DIAGNOSIS — F319 Bipolar disorder, unspecified: Secondary | ICD-10-CM

## 2024-07-24 MED ORDER — ESCITALOPRAM OXALATE 20 MG PO TABS: 20.0000 mg | ORAL_TABLET | Freq: Every day | ORAL | 2 refills | Status: AC

## 2024-07-24 MED ORDER — DIVALPROEX SODIUM ER 500 MG PO TB24: 500.0000 mg | ORAL_TABLET | Freq: Every day | ORAL | 3 refills | Status: AC

## 2024-07-24 MED ORDER — LORAZEPAM 0.5 MG PO TABS: 0.5000 mg | ORAL_TABLET | Freq: Three times a day (TID) | ORAL | 2 refills | Status: AC | PRN

## 2024-07-24 NOTE — Progress Notes (Signed)
 Virtual Visit via Telephone Note  I connected with Abigail Wagner on 07/24/24 at 11:20 AM EDT by telephone and verified that I am speaking with the correct person using two identifiers.  Location: Patient: home Provider: office   I discussed the limitations, risks, security and privacy concerns of performing an evaluation and management service by telephone and the availability of in person appointments. I also discussed with the patient that there may be a patient responsible charge related to this service. The patient expressed understanding and agreed to proceed.      I discussed the assessment and treatment plan with the patient. The patient was provided an opportunity to ask questions and all were answered. The patient agreed with the plan and demonstrated an understanding of the instructions.   The patient was advised to call back or seek an in-person evaluation if the symptoms worsen or if the condition fails to improve as anticipated.  I provided 20 minutes of non-face-to-face time during this encounter.   Barnie Gull, MD  [facility] MD/PA/NP OP Progress Note  07/24/2024 11:40 AM Abigail Wagner  MRN:  [id number]  Chief Complaint:  Chief Complaint  Patient presents with   Anxiety   Depression   Memory Loss   Follow-up   HPI: This patient is a 73 year old married white female who lives with her husband in [place].  She has 3 children and 5 grandchildren.  She is retired from [employer].  The patient and husband return for follow-up via phone call.  She was last seen about 10 months ago.  Since then her ovarian cancer has returned.  However she is elected not to do anything about it.  It is recommended she have CT scan and biopsy but she declines.  She also declines hospice care.  She is closely followed by Dr. Donaciano her PCP.  Apparently her heart rhythm is normal.  She claims she is eating but she is down to 104 pounds.  The patient states that she is doing fine.  However  husband reports that her memory has declined.  At this point I am not sure it is worth getting on medication such as Aricept but I urged him to talk to Dr. Donaciano about it.  She denies being anxious or depressed.  She is sleeping during the day and night.  She does use the Ativan  as needed for anxiety.  She denies having mood swings. Visit Diagnosis:    ICD-10-CM   1. Bipolar 1 disorder (HCC)  F31.9     2. Major depressive disorder, recurrent episode, moderate (HCC)  F33.1       Past Psychiatric History: Past hospitalizations for depression and alcohol abuse, none since 2010  Past Medical History:  Past Medical History:  Diagnosis Date   Allergy    Anal carcinoma (HCC)    Chemo and radiation   Anemia    Anxiety 1974   Dr. Gull at [place] (psychiatrist).     Blood transfusion without reported diagnosis 2017   Carotid artery disease    Chronic kidney disease    DDD (degenerative disc disease), lumbosacral    GERD (gastroesophageal reflux disease) 2010   Headache(784.0)    History of adenomatous polyp of colon 10/07/2015   Tubular adenoma high grade dysplasia   History of herpes genitalis    History of kidney stones    History of suicide attempt 2010   HTN (hypertension)    Hyperlipidemia    IBS (irritable bowel syndrome)  Major depression    OA (osteoarthritis)    Ovarian cancer (HCC)    Stage IIIB papillary ovarian carcinoma  s/p omentectomy and BSO & chemotherapy (completed 12-07-2014)   Paroxysmal atrial fibrillation (HCC)    Prolapse of vaginal vault after hysterectomy 07/20/2015   Rectovaginal fistula 08/05/2015   Seasonal allergies    Sigmoid diverticulosis    Smokers' cough [facility])     Past Surgical History:  Procedure Laterality Date   BIOPSY  10/12/2020   Procedure: BIOPSY;  Surgeon: Eartha Angelia Sieving, MD;  [facility];  Service: Gastroenterology;;   BREAST ENHANCEMENT SURGERY  1986   BREAST SURGERY  1986   CESAREAN SECTION  1978    COLONOSCOPY N/A 10/07/2015   Procedure: COLONOSCOPY;  Surgeon: Claudis RAYMOND Rivet, MD;  [facility];  Service: Endoscopy;  Laterality: N/A;  7:30   COLONOSCOPY WITH PROPOFOL  N/A 10/12/2020   Procedure: COLONOSCOPY WITH PROPOFOL ;  Surgeon: Eartha Angelia Sieving, MD;  [facility];  Service: Gastroenterology;  Laterality: N/A;  9:00   COSMETIC SURGERY  1986   CYSTOSCOPY N/A 02/27/2022   Procedure: CYSTOSCOPY;  Surgeon: Sherrilee Belvie CROME, MD;  Location: [facility];  Service: Urology;  Laterality: N/A;   CYSTOSCOPY WITH FULGERATION N/A 11/03/2021   Procedure: CYSTOSCOPY WITH FULGERATION;  Surgeon: Sherrilee Belvie CROME, MD;  Location: [facility];  Service: Urology;  Laterality: N/A;   ESOPHAGOGASTRODUODENOSCOPY (EGD) WITH PROPOFOL  N/A 10/12/2020   Procedure: ESOPHAGOGASTRODUODENOSCOPY (EGD) WITH PROPOFOL ;  Surgeon: Eartha Angelia Sieving, MD;  [facility];  Service: Gastroenterology;  Laterality: N/A;   EXPLORATORY LAPAROTOMY/ OMENTECTOMY/  BILATERAL SALPINGOOPHORECTOMY/  PORT-A-CATH PLACEMENT  07-03-2014   [place]   KNEE ARTHROSCOPY Left 09/22/2004   LAPAROSCOPY N/A 06/02/2014   Procedure: DIAGNOSTIC LAPAROSCOPY, OMENTAL BIOPSY, RIGHT OVARY BIOPSY, LYSIS OF ADHESIONS;  Surgeon: Elon Pacini, MD;  Location: [facility];  Service: General;  Laterality: N/A;   MUCOSAL ADVANCEMENT FLAP N/A 06/15/2016   Procedure: EXCISION RECTOVAGINAOL FISTULA WITH MUCOSAL ADVANCEMENT FLAP;  Surgeon: Bernarda Ned, MD;  Location: [facility];  Service: General;  Laterality: N/A;   PLACEMENT OF SETON N/A 09/09/2015   Procedure: PLACEMENT OF SETON;  Surgeon: Bernarda Ned, MD;  Location: [facility];  Service: General;  Laterality: N/A;   PORT-A-CATH REMOVAL Right 08/17/2014   Procedure: REMOVAL INTRAPERITONEAL CHEMO PORT;  Surgeon: Elon Pacini, MD;  Location: [facility];  Service: General;  Laterality: Right;   PORTACATH PLACEMENT Right  08/17/2014   Procedure:  PLACE NEW PORT A CATH;  Surgeon: Elon Pacini, MD;  Location: [facility];  Service: General;  Laterality: Right;   RECTAL BIOPSY N/A 09/09/2015   Procedure: BIOPSY OF RECTOVAGINAL MASS;  Surgeon: Bernarda Ned, MD;  Location: [facility];  Service: General;  Laterality: N/A;   TRANSURETHRAL RESECTION OF BLADDER TUMOR N/A 02/27/2022   Procedure: TRANSURETHRAL RESECTION OF BLADDER TUMOR (TURBT);  Surgeon: Sherrilee Belvie CROME, MD;  Location[facility];  Service: Urology;  Laterality: N/A;   TUBAL LIGATION  YRS AGO   VAGINAL HYSTERECTOMY  1981   fibroids    Family Psychiatric History: See below  Family History:  Family History  Problem Relation Age of Onset   Bipolar disorder Mother    Anxiety disorder Mother    Dementia Mother    Depression Mother    Mental illness Mother    Vision loss Mother    Alzheimer's disease Mother    Alcohol abuse Paternal  Uncle    Bipolar disorder Maternal Grandmother    Dementia Maternal Grandmother    Alzheimer's disease Maternal Grandmother    Alcohol abuse Paternal Uncle    Stroke Brother    Deep vein thrombosis Son    Heart disease Father    Hyperlipidemia Father    Hypertension Father    Stroke Father    Vision loss Father    Atrial fibrillation Sister    Heart disease Brother    Heart disease Brother    ADD / ADHD Neg Hx    Drug abuse Neg Hx    OCD Neg Hx    Paranoid behavior Neg Hx    Schizophrenia Neg Hx    Seizures Neg Hx    Sexual abuse Neg Hx    Physical abuse Neg Hx     Social History:  Social History   Socioeconomic History   Marital status: Married    Spouse name: Jerel   Number of children: 4   Years of education: 12   Highest education level: 12th grade  Occupational History   Occupation: retire    Comment: [employer]  Tobacco Use   Smoking status: Every Day    Current packs/day: 0.00    Average packs/day: 0.5 packs/day for 15.0 years (7.5 ttl pk-yrs)    Types:  Cigarettes    Start date: 02/17/2003    Last attempt to quit: 02/16/2018    Years since quitting: 6.4   Smokeless tobacco: Never   Tobacco comments:    half pack a day for 15 yrs  Vaping Use   Vaping status: Never Used  Substance and Sexual Activity   Alcohol use: No   Drug use: No   Sexual activity: Not Currently    Birth control/protection: Surgical    Comment: hyst  Other Topics Concern   Not on file  Social History Narrative   Lives at home with Jerel   Retired [employer]   Married 02/16/1990   Several grandchildren    Social Drivers of Corporate Investment Banker Strain: Low Risk  (07/17/2024)   Overall Financial Resource Strain (CARDIA)    Difficulty of Paying Living Expenses: Not hard at all  Food Insecurity: No Food Insecurity (06/02/2024)   Hunger Vital Sign    Worried About Running Out of Food in the Last Year: Never true    Ran Out of Food in the Last Year: Never true  Transportation Needs: No Transportation Needs (06/02/2024)   PRAPARE - Administrator, Civil Service (Medical): No    Lack of Transportation (Non-Medical): No  Physical Activity: Inactive (07/17/2024)   Exercise Vital Sign    Days of Exercise per Week: 0 days    Minutes of Exercise per Session: 0 min  Stress: No Stress Concern Present (07/17/2024)   [employer] of Occupational Health - Occupational Stress Questionnaire    Feeling of Stress: Only a little  Recent Concern: Stress - Stress Concern Present (06/02/2024)   [employer] of Occupational Health - Occupational Stress Questionnaire    Feeling of Stress: Very much  Social Connections: Moderately Isolated (07/17/2024)   Social Connection and Isolation Panel    Frequency of Communication with Friends and Family: More than three times a week    Frequency of Social Gatherings with Friends and Family: Once a week    Attends Religious Services: Never    Database Administrator or Organizations: No    Attends Banker  Meetings:  Never    Marital Status: Married    Allergies:  Allergies  Allergen Reactions   Morphine  And Codeine     Delirium with sbo    Metabolic Disorder Labs: No results found for: HGBA1C, MPG No results found for: PROLACTIN Lab Results  Component Value Date   CHOL 132 10/30/2022   TRIG 97 10/30/2022   HDL 65 10/30/2022   CHOLHDL 2.0 10/30/2022   VLDL 15 06/29/2021   LDLCALC 49 10/30/2022   LDLCALC 74 06/29/2021   Lab Results  Component Value Date   TSH 6.385 (H) 02/07/2024   TSH 2.049 02/06/2024    Therapeutic Level Labs: No results found for: LITHIUM No results found for: VALPROATE No results found for: CBMZ  Current Medications: Current Outpatient Medications  Medication Sig Dispense Refill   amiodarone  (PACERONE ) 200 MG tablet Take 1 tablet (200 mg total) by mouth daily. 90 tablet 3   amoxicillin -clavulanate (AUGMENTIN ) 875-125 MG tablet Take 1 tablet by mouth 2 (two) times daily. (Patient not taking: Reported on 07/17/2024) 20 tablet 0   apixaban  (ELIQUIS ) 5 MG TABS tablet Take 1 tablet (5 mg total) by mouth 2 (two) times daily. 180 tablet 1   Aspirin-Salicylamide-Caffeine (BC HEADACHE POWDER PO) Take 1-2 Packages by mouth daily at 12 noon.     Cholecalciferol (VITAMIN D -3) 125 MCG (5000 UT) TABS Take 5,000 Units by mouth daily. (Patient not taking: Reported on 06/06/2024)     clotrimazole (LOTRIMIN) 1 % cream Apply 1 application topically 2 (two) times daily as needed (itching around ankles).     colestipol  (COLESTID ) 1 g tablet TAKE 2 TABLETS (2 G TOTAL) BY MOUTH DAILY. 180 tablet 1   CREON  36000-114000 units CPEP capsule TAKE 2 CAPSULES BY MOUTH 3 TIMES DAILY BEFORE MEALS. TWO CAPSULES WITH MEALS AND ONE WITH SNACKS. 200 capsule 1   divalproex  (DEPAKOTE  ER) 500 MG 24 hr tablet Take 1 tablet (500 mg total) by mouth daily. 90 tablet 3   escitalopram  (LEXAPRO ) 20 MG tablet Take 1 tablet (20 mg total) by mouth daily. 90 tablet 2   estradiol  (ESTRACE )  0.1 MG/GM vaginal cream Apply a pea size amount of cream to urethral area of vagina twice weekly 42.5 g 12   Ferrous Gluconate (IRON  27 PO) Take 65 mg by mouth daily. Pt is taking 65mg /325 daily     furosemide  (LASIX ) 40 MG tablet Take 1 tablet (40 mg total) by mouth daily. 30 tablet 11   LORazepam  (ATIVAN ) 0.5 MG tablet Take 1 tablet (0.5 mg total) by mouth 3 (three) times daily as needed for anxiety. 90 tablet 2   metoprolol  succinate (TOPROL -XL) 50 MG 24 hr tablet TAKE 1 TABLET (50 MG TOTAL) BY MOUTH TWICE A DAY .TAKE WITH OR IMMEDIATELY FOLLOWING A MEAL 180 tablet 1   Multiple Vitamin (MULTIVITAMIN) tablet Take 1 tablet by mouth daily.     Multiple Vitamins-Minerals (HAIR SKIN & NAILS ADVANCED PO) Take 1 tablet by mouth 3 (three) times daily.     omeprazole  (PRILOSEC) 40 MG capsule TAKE 1 CAPSULE (40 MG TOTAL) BY MOUTH DAILY. 90 capsule 0   OVER THE COUNTER MEDICATION LYSENE 1 daily per husband     oxyCODONE -acetaminophen  (PERCOCET) 7.5-325 MG tablet Take 1 tablet by mouth every 8 (eight) hours as needed for severe pain (pain score 7-10). This script should last 30 days.  60 per month 90 tablet 0   potassium chloride  SA (KLOR-CON  M) 20 MEQ tablet Take 2 tablets (40 mEq total) by mouth  daily. 180 tablet 1   rosuvastatin  (CRESTOR ) 20 MG tablet Take 1 tablet (20 mg total) by mouth daily. 90 tablet 3   No current facility-administered medications for this visit.     Musculoskeletal: Strength & Muscle Tone: na Gait & Station: na Patient leans: N/A  Psychiatric Specialty Exam: Review of Systems  Constitutional:  Positive for appetite change.  Musculoskeletal:  Positive for back pain.  Neurological:  Positive for weakness.  All other systems reviewed and are negative.   There were no vitals taken for this visit.There is no height or weight on file to calculate BMI.  General Appearance: NA  Eye Contact:  NA  Speech:  Clear and Coherent  Volume:  Decreased  Mood:  Euthymic  Affect:  NA   Thought Process:  Goal Directed  Orientation:  Other:  Person and place  Thought Content: WDL   Suicidal Thoughts:  No  Homicidal Thoughts:  No  Memory:  Immediate;   Fair Recent;   Poor Remote;   Poor  Judgement:  Impaired  Insight:  Shallow  Psychomotor Activity:  Decreased  Concentration:  Concentration: Poor and Attention Span: Poor  Recall:  Poor  Fund of Knowledge: Fair  Language: Good  Akathisia:  No  Handed:  Right  AIMS (if indicated): not done  Assets:  Communication Skills Resilience Social Support  ADL's:  Impaired  Cognition: Impaired,  Mild  Sleep:  Good   Screenings: AUDIT    Flowsheet Row Appointment from 09/18/2023 in [facility] Most recent reading at 09/07/2023  6:58 PM Office Visit from 09/18/2023 in [facility] Most recent reading at 09/07/2023  6:58 PM Appointment from 09/11/2023 in [facility] Most recent reading at 09/07/2023  6:58 PM Appointment from 03/27/2023 in [facility] [place] Most recent reading at 03/27/2023 11:58 AM  Alcohol Use Disorder Identification Test Final Score (AUDIT) 4  4  4  3    GAD-7    Flowsheet Row Office Visit from 06/06/2024 in [facility] Office Visit from 01/01/2024 in [facility] Office Visit from 09/18/2023 in [facility] Clinical Support from 01/10/2021 in [facility]  Total GAD-7 Score 2 2 3 8    PHQ2-9    Flowsheet Row Clinical Support from 07/17/2024 in [facility] Office Visit from 06/06/2024 in [facility] Office Visit from 01/01/2024 in [facility] Office Visit from 09/18/2023 in [facility] Office Visit from 07/17/2023 in [facility]  PHQ-2 Total Score 0 2 1 1  0  PHQ-9  Total Score 9 11 5 6  --   Flowsheet Row ED to Hosp-Admission (Discharged) from 02/06/2024 in [facility] ED from 01/13/2024 in [facility] Department at [facility] ED from 12/28/2023 in [facility] Department at [facility]  C-SSRS RISK CATEGORY Low Risk No Risk No Risk     Assessment and Plan: This patient is a 73 year old female with a history of depression generalized anxiety possible bipolar disorder.  She has had a recurrence of cancer but does not want to pursue the treatment.  Her husband reports memory loss but I am not sure it is trying to treat this makes sense at this point as she is recommended for hospice care.  She does seem to be comfortable in terms of her mental status.  She will continue Lexapro  20 mg daily for depression, Ativan  0.5 mg 3 times daily for anxiety and Depakote  ER 500 mg at bedtime for mood stabilization.  She will return to see me in 6 months  Collaboration of Care: Collaboration of Care: Primary Care Provider AEB notes are shared with PCP on the epic system  Patient/Guardian was advised Release of Information must be obtained prior to any record release in order to collaborate their care with an outside provider. Patient/Guardian was advised if they have not already done so to contact the registration department to sign all necessary forms in order for us  to release information regarding their care.   Consent: Patient/Guardian gives verbal consent for treatment and assignment of benefits for services provided during this visit. Patient/Guardian expressed understanding and agreed to proceed.    Barnie Gull, MD 07/24/2024, 11:40 AM

## 2024-07-24 NOTE — Telephone Encounter (Signed)
 Staff called patient number back yesterday and was not able to reach anyone and message was left. Appointment change was made yesterday.

## 2024-07-28 ENCOUNTER — Ambulatory Visit (HOSPITAL_COMMUNITY)
Admission: RE | Admit: 2024-07-28 | Discharge: 2024-07-28 | Disposition: A | Discharge status: Home / Self Care | Source: Ambulatory Visit | Attending: Cardiology | Admitting: Cardiology

## 2024-07-28 DIAGNOSIS — I6523 Occlusion and stenosis of bilateral carotid arteries: Secondary | ICD-10-CM | POA: Insufficient documentation

## 2024-07-29 ENCOUNTER — Ambulatory Visit: Payer: Self-pay | Admitting: Cardiology

## 2024-08-14 ENCOUNTER — Encounter: Payer: Self-pay | Admitting: Family Medicine

## 2024-08-14 ENCOUNTER — Other Ambulatory Visit (INDEPENDENT_AMBULATORY_CARE_PROVIDER_SITE_OTHER): Payer: Self-pay | Admitting: Gastroenterology

## 2024-08-14 DIAGNOSIS — K8681 Exocrine pancreatic insufficiency: Secondary | ICD-10-CM

## 2024-08-15 ENCOUNTER — Other Ambulatory Visit: Payer: Self-pay | Admitting: Family Medicine

## 2024-08-15 MED ORDER — OXYCODONE-ACETAMINOPHEN 7.5-325 MG PO TABS: 1.0000 | ORAL_TABLET | Freq: Three times a day (TID) | ORAL | 0 refills | Status: DC | PRN

## 2024-09-10 ENCOUNTER — Other Ambulatory Visit: Payer: Self-pay

## 2024-09-10 ENCOUNTER — Telehealth: Payer: Self-pay

## 2024-09-10 MED ORDER — OMEPRAZOLE 40 MG PO CPDR: 40.0000 mg | DELAYED_RELEASE_CAPSULE | Freq: Every day | ORAL | 0 refills | Status: AC

## 2024-09-10 NOTE — Telephone Encounter (Signed)
 Sent in medication

## 2024-09-10 NOTE — Telephone Encounter (Signed)
 Prescription Request  09/10/2024  LOV: 06/06/24  What is the name of the medication or equipment? omeprazole  (PRILOSEC) 40 MG capsule [[id number]]   Have you contacted your pharmacy to request a refill? Yes   Which pharmacy would you like this sent to?  [id number] - [place], [place] - [address] AT [facility] [address] [place] [address] Phone: [phone] Fax: [phone]    Patient notified that their request is being sent to the clinical staff for review and that they should receive a response within 2 business days.   Please advise at [facility] [phone]

## 2024-09-22 ENCOUNTER — Other Ambulatory Visit: Payer: Self-pay | Admitting: Family Medicine

## 2024-09-22 ENCOUNTER — Other Ambulatory Visit (INDEPENDENT_AMBULATORY_CARE_PROVIDER_SITE_OTHER): Payer: Self-pay | Admitting: Gastroenterology

## 2024-09-22 ENCOUNTER — Encounter: Payer: Self-pay | Admitting: Family Medicine

## 2024-09-22 ENCOUNTER — Other Ambulatory Visit: Payer: Self-pay

## 2024-09-22 DIAGNOSIS — K529 Noninfective gastroenteritis and colitis, unspecified: Secondary | ICD-10-CM

## 2024-09-22 MED ORDER — OXYCODONE-ACETAMINOPHEN 7.5-325 MG PO TABS: 1.0000 | ORAL_TABLET | Freq: Three times a day (TID) | ORAL | 0 refills | Status: DC | PRN

## 2024-09-22 NOTE — Telephone Encounter (Signed)
Patient will need office visit for further refills.

## 2024-10-28 ENCOUNTER — Other Ambulatory Visit: Payer: Self-pay

## 2024-10-28 ENCOUNTER — Encounter: Payer: Self-pay | Admitting: Family Medicine

## 2024-10-29 MED ORDER — OXYCODONE-ACETAMINOPHEN 7.5-325 MG PO TABS: 1.0000 | ORAL_TABLET | Freq: Three times a day (TID) | ORAL | 0 refills | Status: AC | PRN

## 2025-07-23 ENCOUNTER — Ambulatory Visit
